# Patient Record
Sex: Male | Born: 1965
Health system: Southern US, Community
[De-identification: ages and names within clinical notes are randomized; demographics above are authoritative.]

## PROBLEM LIST (undated history)

## (undated) DIAGNOSIS — G8220 Paraplegia, unspecified: Secondary | ICD-10-CM

## (undated) DIAGNOSIS — K219 Gastro-esophageal reflux disease without esophagitis: Secondary | ICD-10-CM

## (undated) DIAGNOSIS — G8929 Other chronic pain: Secondary | ICD-10-CM

## (undated) DIAGNOSIS — F1490 Cocaine use, unspecified, uncomplicated: Secondary | ICD-10-CM

## (undated) DIAGNOSIS — K529 Noninfective gastroenteritis and colitis, unspecified: Secondary | ICD-10-CM

## (undated) DIAGNOSIS — W3400XA Accidental discharge from unspecified firearms or gun, initial encounter: Secondary | ICD-10-CM

## (undated) DIAGNOSIS — G4730 Sleep apnea, unspecified: Secondary | ICD-10-CM

## (undated) DIAGNOSIS — L8990 Pressure ulcer of unspecified site, unspecified stage: Secondary | ICD-10-CM

## (undated) DIAGNOSIS — S21239A Puncture wound without foreign body of unspecified back wall of thorax without penetration into thoracic cavity, initial encounter: Secondary | ICD-10-CM

## (undated) DIAGNOSIS — E43 Unspecified severe protein-calorie malnutrition: Secondary | ICD-10-CM

## (undated) DIAGNOSIS — I10 Essential (primary) hypertension: Secondary | ICD-10-CM

## (undated) HISTORY — PX: EYE SURGERY: SHX253

## (undated) HISTORY — PX: HIP SURGERY: SHX245

## (undated) HISTORY — PX: OTHER SURGICAL HISTORY: SHX169

---

## 2011-11-10 ENCOUNTER — Emergency Department (HOSPITAL_COMMUNITY): Payer: Medicare Other

## 2011-11-10 ENCOUNTER — Encounter (HOSPITAL_COMMUNITY): Payer: Self-pay | Admitting: Emergency Medicine

## 2011-11-10 ENCOUNTER — Emergency Department (HOSPITAL_COMMUNITY)
Admission: EM | Admit: 2011-11-10 | Discharge: 2011-11-10 | Disposition: A | Discharge status: Home / Self Care | Payer: Medicare Other | Attending: Emergency Medicine | Admitting: Emergency Medicine

## 2011-11-10 DIAGNOSIS — M25429 Effusion, unspecified elbow: Secondary | ICD-10-CM | POA: Insufficient documentation

## 2011-11-10 DIAGNOSIS — I10 Essential (primary) hypertension: Secondary | ICD-10-CM | POA: Insufficient documentation

## 2011-11-10 DIAGNOSIS — G8220 Paraplegia, unspecified: Secondary | ICD-10-CM | POA: Insufficient documentation

## 2011-11-10 DIAGNOSIS — M7030 Other bursitis of elbow, unspecified elbow: Secondary | ICD-10-CM

## 2011-11-10 DIAGNOSIS — K219 Gastro-esophageal reflux disease without esophagitis: Secondary | ICD-10-CM | POA: Insufficient documentation

## 2011-11-10 DIAGNOSIS — M25529 Pain in unspecified elbow: Secondary | ICD-10-CM | POA: Insufficient documentation

## 2011-11-10 DIAGNOSIS — Z79899 Other long term (current) drug therapy: Secondary | ICD-10-CM | POA: Insufficient documentation

## 2011-11-10 DIAGNOSIS — M7020 Olecranon bursitis, unspecified elbow: Secondary | ICD-10-CM | POA: Insufficient documentation

## 2011-11-10 HISTORY — DX: Essential (primary) hypertension: I10

## 2011-11-10 HISTORY — DX: Gastro-esophageal reflux disease without esophagitis: K21.9

## 2011-11-10 HISTORY — DX: Paraplegia, unspecified: G82.20

## 2011-11-10 MED ORDER — PREDNISONE 20 MG PO TABS
60.0000 mg | ORAL_TABLET | Freq: Once | ORAL | Status: AC
  Administered 2011-11-10: 60 mg via ORAL
  Filled 2011-11-10: qty 3

## 2011-11-10 MED ORDER — PREDNISONE 20 MG PO TABS: ORAL_TABLET | ORAL | Status: AC

## 2011-11-10 NOTE — ED Notes (Signed)
Pt c/o left elbow pain x3 days. Pt staes arm has eben swolllen. No swelling noted and no inc in temp noted. Pt denies any trauma.

## 2011-11-10 NOTE — ED Provider Notes (Signed)
History     CSN: [id number]  Arrival date & time 11/10/11  1733   First MD Initiated Contact with Patient 11/10/11 2002      Chief Complaint  Patient presents with  . Elbow Pain    (Consider location/radiation/quality/duration/timing/severity/associated sxs/prior treatment) HPI Comments: Agent 0 paraplegic.  He sees a wheelchair for mobility, now having left elbow pain with out known injury.  No fall.  Has not hit it against a wall.  There is no significant swelling, redness, no tingling distally.  Full range of motion  The history is provided by the patient.    Past Medical History  Diagnosis Date  . Paraplegia   . Hypertension   . Acid reflux     History reviewed. No pertinent past surgical history.  History reviewed. No pertinent family history.  History  Substance Use Topics  . Smoking status: Never Smoker   . Smokeless tobacco: Not on file  . Alcohol Use: No      Review of Systems  Constitutional: Negative for fever.  Musculoskeletal: Negative for joint swelling.  Neurological: Negative for weakness and numbness.    Allergies  Review of patient's allergies indicates no known allergies.  Home Medications   Current Outpatient Rx  Name Route Sig Dispense Refill  . HYDROCHLOROTHIAZIDE 25 MG PO TABS Oral Take 25 mg by mouth daily.    . IBUPROFEN 800 MG PO TABS Oral Take 800 mg by mouth every 8 (eight) hours as needed. For pain    . LISINOPRIL 5 MG PO TABS Oral Take 5 mg by mouth daily.    . OXYCODONE-ACETAMINOPHEN 5-325 MG PO TABS Oral Take 1 tablet by mouth 3 (three) times daily.    [name] RANITIDINE HCL 300 MG PO CAPS Oral Take 300 mg by mouth 2 (two) times daily.    [name] PREDNISONE 20 MG PO TABS  3 Tabs PO Days 1-3, then 2 tabs PO Days 4-6, then 1 tab PO Day 7-9, then Half Tab PO Day 10-12 20 tablet 0    BP 143/90  Pulse 89  Temp(Src) 98.3 F (36.8 C) (Oral)  Resp 18  SpO2 98%  Physical Exam  Constitutional: He is oriented to person, place, and time. He  appears well-developed and well-nourished.  HENT:  Head: Normocephalic.  Eyes: Pupils are equal, round, and reactive to light.  Neck: Normal range of motion.  Cardiovascular: Normal rate.   Pulmonary/Chest: Effort normal.  Musculoskeletal:       : Bursitis with increased pain in the medial bursa or greater than lateral bursa or  Neurological: He is alert and oriented to person, place, and time.  Skin: Skin is warm and dry.  Psychiatric: He has a normal mood and affect.    ED Course  Procedures (including critical care time)  Labs Reviewed - No data to display Dg Elbow Complete Left  11/10/2011  *RADIOLOGY REPORT*  Clinical Data: Left elbow pain and swelling  LEFT ELBOW - COMPLETE 3+ VIEW  Comparison: None.  Findings: Four views of the left elbow submitted.  No acute fracture or subluxation.  Spurring proximal dorsal aspect of the ulna is noted.  No posterior fat pad sign. Mild spurring of medial humeral epicondyle.  IMPRESSION: No acute fracture or subluxation.  Spurring of proximal ulna.  Original Report Authenticated By: Natasha Mead, M.D.     1. Bursitis of elbow       MDM  Bursitis    Medical screening examination/treatment/procedure(s) were performed by non-physician practitioner and  as supervising physician I was immediately available for consultation/collaboration. Osvaldo Human, M.D.     Arman Filter, NP 11/10/11 2056  Arman Filter, NP 11/10/11 2056  Carleene Cooper III, MD 11/11/11 [phone]

## 2011-11-10 NOTE — Progress Notes (Signed)
Orthopedic Tech Progress Note Patient Details:  [name] 1966/04/19 [id number]  Other Ortho Devices Type of Ortho Device: Ace wrap Ortho Device Location: left elbow Ortho Device Interventions: Application   Nikki Dom 11/10/2011, 8:49 PM

## 2011-11-10 NOTE — ED Notes (Signed)
Pt d/c home in [place]. Pt verbalizes understanding of medication admin

## 2011-11-10 NOTE — ED Notes (Signed)
Pt c/o left elbow pain and swelling x 2 days; pt parapalegic

## 2011-12-26 ENCOUNTER — Emergency Department (HOSPITAL_COMMUNITY)
Admission: EM | Admit: 2011-12-26 | Discharge: 2011-12-26 | Disposition: A | Discharge status: Home / Self Care | Payer: PRIVATE HEALTH INSURANCE | Attending: Emergency Medicine | Admitting: Emergency Medicine

## 2011-12-26 ENCOUNTER — Encounter (HOSPITAL_COMMUNITY): Payer: Self-pay | Admitting: Emergency Medicine

## 2011-12-26 DIAGNOSIS — Z79899 Other long term (current) drug therapy: Secondary | ICD-10-CM | POA: Insufficient documentation

## 2011-12-26 DIAGNOSIS — G8220 Paraplegia, unspecified: Secondary | ICD-10-CM | POA: Insufficient documentation

## 2011-12-26 DIAGNOSIS — G4730 Sleep apnea, unspecified: Secondary | ICD-10-CM

## 2011-12-26 DIAGNOSIS — K219 Gastro-esophageal reflux disease without esophagitis: Secondary | ICD-10-CM | POA: Insufficient documentation

## 2011-12-26 DIAGNOSIS — I10 Essential (primary) hypertension: Secondary | ICD-10-CM | POA: Insufficient documentation

## 2011-12-26 HISTORY — DX: Accidental discharge from unspecified firearms or gun, initial encounter: W34.00XA

## 2011-12-26 HISTORY — DX: Puncture wound without foreign body of unspecified back wall of thorax without penetration into thoracic cavity, initial encounter: S21.239A

## 2011-12-26 NOTE — Discharge Instructions (Signed)
It is very important to followup with your primary care physician in [place] in the next week to further address your concerns of sleep apnea. You will need a sleep study for further evaluation and management. Return to the closest ER for any emergent changing or worsening symptoms  Sleep Apnea Sleep apnea is when a person's sleep is disrupted many times. This makes a person tired during the day. There are two types of sleep apnea. One is when breathing stops for a short time because your airway is blocked (obstructive sleep apnea). The other type is when the brain sometimes fails to give the normal signal to breathe (central sleep apnea).  HOME CARE  Do not sleep on your back.   Take all medicine as told by your doctor.   Avoid alcohol, calming medicines (sedatives), and depressant drugs.   Use your CPAP (a mask-like machine and pump) to keep your airway open while sleeping.   If you are overweight, try to lose some weight. Talk to your doctor about a healthy weight goal.   If the problem is related to heart failure, take medicines as told by your doctor.  MAKE SURE YOU:  Understand these instructions.   Will watch your condition.   Will get help right away if you are not doing well or get worse.  Document Released: 06/27/2008 Document Revised: 09/07/2011 Document Reviewed: 06/27/2008 [facility] Patient Information 2012 [place], [place].

## 2011-12-26 NOTE — ED Notes (Signed)
Pt states he has periods where he stops breathing in his sleep  Pt states he went to sleep this afternoon and someone woke him up telling him he had stopped breathing while he was sleeping  Pt here for evaluation  Pt denies pain at this time
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
## 2011-12-27 NOTE — ED Provider Notes (Signed)
Medical screening examination/treatment/procedure(s) were performed by non-physician practitioner and as supervising physician I was immediately available for consultation/collaboration.    Masyn Rostro L Angelle Isais, MD 12/27/11 1518 

## 2012-02-16 ENCOUNTER — Emergency Department (HOSPITAL_COMMUNITY)
Admission: EM | Admit: 2012-02-16 | Discharge: 2012-02-17 | Disposition: A | Discharge status: Home / Self Care | Payer: PRIVATE HEALTH INSURANCE | Attending: Emergency Medicine | Admitting: Emergency Medicine

## 2012-02-16 ENCOUNTER — Emergency Department (HOSPITAL_COMMUNITY): Payer: PRIVATE HEALTH INSURANCE

## 2012-02-16 ENCOUNTER — Encounter (HOSPITAL_COMMUNITY): Payer: Self-pay | Admitting: *Deleted

## 2012-02-16 DIAGNOSIS — M25519 Pain in unspecified shoulder: Secondary | ICD-10-CM | POA: Insufficient documentation

## 2012-02-16 DIAGNOSIS — K219 Gastro-esophageal reflux disease without esophagitis: Secondary | ICD-10-CM | POA: Insufficient documentation

## 2012-02-16 DIAGNOSIS — G8220 Paraplegia, unspecified: Secondary | ICD-10-CM | POA: Insufficient documentation

## 2012-02-16 DIAGNOSIS — I10 Essential (primary) hypertension: Secondary | ICD-10-CM | POA: Insufficient documentation

## 2012-02-16 NOTE — ED Notes (Signed)
C/o R shoulder pain, onset 3d ago, now worse & severe today, "h/o paraplegia, uses upper body for transfers, has been unable to transfer self using R arm as usual", CMS intact, decreased ROM, radial pulses strong and equal, grips strong and equal, difficult to discern deformity d/t body habitus.

## 2012-02-17 MED ORDER — IBUPROFEN 800 MG PO TABS: 800.0000 mg | ORAL_TABLET | Freq: Three times a day (TID) | ORAL | Status: AC | PRN

## 2012-02-17 MED ORDER — OXYCODONE-ACETAMINOPHEN 5-325 MG PO TABS
2.0000 | ORAL_TABLET | Freq: Once | ORAL | Status: AC
  Administered 2012-02-17: 2 via ORAL
  Filled 2012-02-17: qty 2

## 2012-02-17 MED ORDER — METHOCARBAMOL 500 MG PO TABS: 1000.0000 mg | ORAL_TABLET | Freq: Four times a day (QID) | ORAL | Status: AC

## 2012-02-17 MED ORDER — OXYCODONE-ACETAMINOPHEN 5-325 MG PO TABS: 1.0000 | ORAL_TABLET | Freq: Four times a day (QID) | ORAL | Status: AC | PRN
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
## 2012-02-17 NOTE — ED Provider Notes (Signed)
Medical screening examination/treatment/procedure(s) were performed by non-physician practitioner and as supervising physician I was immediately available for consultation/collaboration.  Jasmine Awe, MD 02/17/12 [phone]

## 2012-02-17 NOTE — Discharge Instructions (Signed)
Please read and follow all provided instructions.  Your diagnoses today include:  1. Shoulder pain     Tests performed today include:  Shoulder x-ray that did not show any broken bones or dislocations  Vital signs. See below for your results today.   Medications prescribed:   Percocet (oxycodone/acetaminophen) - narcotic pain medication  You have been prescribed narcotic pain medication such as Vicodin or Percocet: DO NOT drive or perform any activities that require you to be awake and alert because this medicine can make you drowsy. BE VERY CAREFUL not to take multiple medicines containing Tylenol (also called acetaminophen). Doing so can lead to an overdose which can damage your liver and cause liver failure and possibly death.    Robaxin (methocarbamol) - muscle relaxer medication  You have been prescribed a muscle relaxer medication such as Robaxin, Flexeril, or Valium: DO NOT drive or perform any activities that require you to be awake and alert because this medicine can make you drowsy.    Ibuprofen - anti-inflammatory pain medication  Do not exceed 800mg  ibuprofen every 8 hours  You have been prescribed an anti-inflammatory medication or NSAID. Take with food. Take smallest effective dose for the shortest duration needed for your pain. Stop taking if you experience stomach pain or vomiting.   Take any prescribed medications only as directed.  Home care instructions:  Follow any educational materials contained in this packet.  BE VERY CAREFUL not to take multiple medicines containing Tylenol (also called acetaminophen). Doing so can lead to an overdose which can damage your liver and cause liver failure and possibly death.   Follow-up instructions: Please follow-up with your primary care provider in the next 3 days for further evaluation of your symptoms. If you do not have a primary care doctor -- see below for referral information.   Follow-up with orthopedic doctor in  one week for further evaluation of your pain.   Return instructions:   Please return to the Emergency Department if you experience worsening symptoms.   Please return if you have any other emergent concerns.  Additional Information:  Your vital signs today were: BP 142/76  Pulse 85  Temp(Src) 97.9 F (36.6 C) (Oral)  Resp 18  SpO2 100% If your blood pressure (BP) was elevated above 135/85 this visit, please have this repeated by your doctor within one month. -------------- No Primary Care Doctor Call [facility]  [phone] Other agencies that provide inexpensive medical care    [facility]  [phone]    [facility]  [phone]    [facility]  [phone]    [facility]  [phone]    [facility]  [phone]    [facility]  [phone] -------------- RESOURCE GUIDE:  Dental Problems  Patients with Medicaid: [facility] [phone]                                            [phone] [address] [employer] [address] Phone:  [phone]  Phone:  [phone]  If unable to pay or uninsured, contact:  [facility] or [facility]. to become qualified for the adult dental clinic.  Chronic Pain Problems Contact [facility]  [phone] Patients need to be referred by their primary care doctor.  Insufficient Money for Medicine Contact [employer]:  call "211" or [facility] [phone].  Psychological Services [facility]  [phone] [facility]  [phone] [facility]   [phone] (emergency services [phone])  Substance Abuse Resources [facility]  [phone] [facility] [phone] [facility] [place] [phone] Floydene Flock [phone] Residential & Outpatient Substance Abuse Program  [phone]  Abuse/Neglect [facility]  Child Abuse Hotline 256-376-9503 [facility] Child Abuse Hotline [phone]) [phone] (After Hours)  Emergency Shelter [facility] [phone]  Maternity Homes [facility] at [facility] [place] [facility] [phone] [facility] [phone]  [facility] Resources  [facility]     [employer]                          [facility] [address] [place]                       [address]      [address]  Blondell Reveal Phone:  [phone]                                   Phone:  [phone]                 Phone:  [phone]  [facility] Phone:  [phone]  [facility] Child Abuse Hotline [phone] (After Hours)

## 2012-02-29 ENCOUNTER — Other Ambulatory Visit: Payer: Self-pay | Admitting: Orthopedic Surgery

## 2012-02-29 DIAGNOSIS — M25519 Pain in unspecified shoulder: Secondary | ICD-10-CM

## 2012-02-29 DIAGNOSIS — Z139 Encounter for screening, unspecified: Secondary | ICD-10-CM

## 2012-03-12 ENCOUNTER — Other Ambulatory Visit: Payer: Self-pay | Admitting: Orthopedic Surgery

## 2012-03-12 ENCOUNTER — Ambulatory Visit
Admission: RE | Admit: 2012-03-12 | Discharge: 2012-03-12 | Disposition: A | Discharge status: Home / Self Care | Payer: PRIVATE HEALTH INSURANCE | Source: Ambulatory Visit | Attending: Orthopedic Surgery | Admitting: Orthopedic Surgery

## 2012-03-12 DIAGNOSIS — M25519 Pain in unspecified shoulder: Secondary | ICD-10-CM

## 2012-03-12 DIAGNOSIS — Z139 Encounter for screening, unspecified: Secondary | ICD-10-CM

## 2012-03-14 ENCOUNTER — Ambulatory Visit
Admission: RE | Admit: 2012-03-14 | Discharge: 2012-03-14 | Disposition: A | Discharge status: Home / Self Care | Payer: PRIVATE HEALTH INSURANCE | Source: Ambulatory Visit | Attending: Orthopedic Surgery | Admitting: Orthopedic Surgery

## 2012-03-14 DIAGNOSIS — M25519 Pain in unspecified shoulder: Secondary | ICD-10-CM

## 2012-03-14 MED ORDER — IOHEXOL 180 MG/ML  SOLN: 15.0000 mL | Freq: Once | INTRAMUSCULAR | Status: AC | PRN

## 2012-11-14 ENCOUNTER — Emergency Department (HOSPITAL_COMMUNITY)
Admission: EM | Admit: 2012-11-14 | Discharge: 2012-11-15 | Disposition: A | Discharge status: Home / Self Care | Payer: PRIVATE HEALTH INSURANCE | Attending: Emergency Medicine | Admitting: Emergency Medicine

## 2012-11-14 DIAGNOSIS — K219 Gastro-esophageal reflux disease without esophagitis: Secondary | ICD-10-CM | POA: Insufficient documentation

## 2012-11-14 DIAGNOSIS — I10 Essential (primary) hypertension: Secondary | ICD-10-CM | POA: Insufficient documentation

## 2012-11-14 DIAGNOSIS — G8220 Paraplegia, unspecified: Secondary | ICD-10-CM | POA: Insufficient documentation

## 2012-11-14 DIAGNOSIS — R42 Dizziness and giddiness: Secondary | ICD-10-CM | POA: Insufficient documentation

## 2012-11-14 DIAGNOSIS — IMO0001 Reserved for inherently not codable concepts without codable children: Secondary | ICD-10-CM | POA: Insufficient documentation

## 2012-11-14 DIAGNOSIS — R509 Fever, unspecified: Secondary | ICD-10-CM | POA: Insufficient documentation

## 2012-11-14 DIAGNOSIS — R51 Headache: Secondary | ICD-10-CM | POA: Insufficient documentation

## 2012-11-14 DIAGNOSIS — N390 Urinary tract infection, site not specified: Secondary | ICD-10-CM | POA: Insufficient documentation

## 2012-11-14 DIAGNOSIS — R5381 Other malaise: Secondary | ICD-10-CM | POA: Insufficient documentation

## 2012-11-14 DIAGNOSIS — Z87828 Personal history of other (healed) physical injury and trauma: Secondary | ICD-10-CM | POA: Insufficient documentation

## 2012-11-14 DIAGNOSIS — R5383 Other fatigue: Secondary | ICD-10-CM | POA: Insufficient documentation

## 2012-11-14 DIAGNOSIS — R209 Unspecified disturbances of skin sensation: Secondary | ICD-10-CM | POA: Insufficient documentation

## 2012-11-14 DIAGNOSIS — Z79899 Other long term (current) drug therapy: Secondary | ICD-10-CM | POA: Insufficient documentation

## 2012-11-14 LAB — URINALYSIS, ROUTINE W REFLEX MICROSCOPIC
Nitrite: NEGATIVE
Specific Gravity, Urine: 1.025 (ref 1.005–1.030)
pH: 5 (ref 5.0–8.0)

## 2012-11-14 LAB — URINE MICROSCOPIC-ADD ON

## 2012-11-14 LAB — COMPREHENSIVE METABOLIC PANEL
ALT: 18 U/L (ref 0–53)
Albumin: 3.8 g/dL (ref 3.5–5.2)
Alkaline Phosphatase: 77 U/L (ref 39–117)
BUN: 16 mg/dL (ref 6–23)
Chloride: 96 mEq/L (ref 96–112)
Potassium: 3.8 mEq/L (ref 3.5–5.1)
Sodium: 135 mEq/L (ref 135–145)
Total Bilirubin: 0.2 mg/dL — ABNORMAL LOW (ref 0.3–1.2)

## 2012-11-14 LAB — CBC WITH DIFFERENTIAL/PLATELET
Basophils Relative: 0 % (ref 0–1)
Hemoglobin: 13.8 g/dL (ref 13.0–17.0)
Lymphs Abs: 1 10*3/uL (ref 0.7–4.0)
MCHC: 32.8 g/dL (ref 30.0–36.0)
Monocytes Relative: 9 % (ref 3–12)
Neutro Abs: 2.5 10*3/uL (ref 1.7–7.7)
Neutrophils Relative %: 64 % (ref 43–77)
RBC: 4.7 MIL/uL (ref 4.22–5.81)

## 2012-11-14 MED ORDER — CIPROFLOXACIN HCL 500 MG PO TABS
500.0000 mg | ORAL_TABLET | Freq: Once | ORAL | Status: AC
Start: 2012-11-15 — End: 2012-11-15
  Administered 2012-11-15: 500 mg via ORAL
  Filled 2012-11-14: qty 1

## 2012-11-14 MED ORDER — HYDROCODONE-ACETAMINOPHEN 5-325 MG PO TABS
2.0000 | ORAL_TABLET | Freq: Once | ORAL | Status: AC
  Administered 2012-11-15: 2 via ORAL
  Filled 2012-11-14: qty 2

## 2012-11-14 MED ORDER — CIPROFLOXACIN HCL 500 MG PO TABS: 500.0000 mg | ORAL_TABLET | Freq: Two times a day (BID) | ORAL | Status: DC

## 2012-11-14 NOTE — ED Notes (Signed)
Pt in from home via [facility], pt c/o HA onset today, pt reports dx of UTI x 1 wk, pt A&O x 4, follows commands, speaks in complete sentences
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
## 2012-11-15 NOTE — ED Provider Notes (Signed)
Medical screening examination/treatment/procedure(s) were performed by non-physician practitioner and as supervising physician I was immediately available for consultation/collaboration.   Gerhard Munch, MD 11/15/12 [phone]

## 2012-11-16 LAB — URINE CULTURE

## 2013-01-27 ENCOUNTER — Emergency Department (HOSPITAL_COMMUNITY)
Admission: EM | Admit: 2013-01-27 | Discharge: 2013-01-27 | Disposition: A | Discharge status: Home / Self Care | Payer: PRIVATE HEALTH INSURANCE | Attending: Emergency Medicine | Admitting: Emergency Medicine

## 2013-01-27 ENCOUNTER — Encounter (HOSPITAL_COMMUNITY): Payer: Self-pay | Admitting: Adult Health

## 2013-01-27 DIAGNOSIS — Z87828 Personal history of other (healed) physical injury and trauma: Secondary | ICD-10-CM | POA: Insufficient documentation

## 2013-01-27 DIAGNOSIS — Y9389 Activity, other specified: Secondary | ICD-10-CM | POA: Insufficient documentation

## 2013-01-27 DIAGNOSIS — S335XXA Sprain of ligaments of lumbar spine, initial encounter: Secondary | ICD-10-CM | POA: Insufficient documentation

## 2013-01-27 DIAGNOSIS — I10 Essential (primary) hypertension: Secondary | ICD-10-CM | POA: Insufficient documentation

## 2013-01-27 DIAGNOSIS — Z79899 Other long term (current) drug therapy: Secondary | ICD-10-CM | POA: Insufficient documentation

## 2013-01-27 DIAGNOSIS — S39012A Strain of muscle, fascia and tendon of lower back, initial encounter: Secondary | ICD-10-CM

## 2013-01-27 DIAGNOSIS — K219 Gastro-esophageal reflux disease without esophagitis: Secondary | ICD-10-CM | POA: Insufficient documentation

## 2013-01-27 DIAGNOSIS — Z8669 Personal history of other diseases of the nervous system and sense organs: Secondary | ICD-10-CM | POA: Insufficient documentation

## 2013-01-27 DIAGNOSIS — Y92410 Unspecified street and highway as the place of occurrence of the external cause: Secondary | ICD-10-CM | POA: Insufficient documentation

## 2013-01-27 MED ORDER — IBUPROFEN 600 MG PO TABS: 600.0000 mg | ORAL_TABLET | Freq: Four times a day (QID) | ORAL | Status: DC | PRN

## 2013-01-27 MED ORDER — HYDROCODONE-ACETAMINOPHEN 5-325 MG PO TABS: 1.0000 | ORAL_TABLET | ORAL | Status: DC | PRN

## 2013-01-27 MED ORDER — METHOCARBAMOL 500 MG PO TABS: 500.0000 mg | ORAL_TABLET | Freq: Two times a day (BID) | ORAL | Status: DC

## 2013-01-27 MED ORDER — METHOCARBAMOL 500 MG PO TABS
1000.0000 mg | ORAL_TABLET | Freq: Once | ORAL | Status: DC
  Filled 2013-01-27: qty 2

## 2013-01-27 MED ORDER — KETOROLAC TROMETHAMINE 60 MG/2ML IM SOLN
60.0000 mg | Freq: Once | INTRAMUSCULAR | Status: DC
  Filled 2013-01-27: qty 2

## 2013-01-27 MED ORDER — HYDROCODONE-ACETAMINOPHEN 5-325 MG PO TABS
2.0000 | ORAL_TABLET | Freq: Once | ORAL | Status: DC
  Filled 2013-01-27: qty 2
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
## 2013-01-27 NOTE — ED Notes (Signed)
Dr. Yelverton at the bedside.  
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
## 2013-12-24 ENCOUNTER — Emergency Department (HOSPITAL_COMMUNITY)
Admission: EM | Admit: 2013-12-24 | Discharge: 2013-12-25 | Disposition: A | Discharge status: Home / Self Care | Payer: Medicare Other | Attending: Emergency Medicine | Admitting: Emergency Medicine

## 2013-12-24 ENCOUNTER — Encounter (HOSPITAL_COMMUNITY): Payer: Self-pay | Admitting: Emergency Medicine

## 2013-12-24 ENCOUNTER — Emergency Department (HOSPITAL_COMMUNITY): Payer: Medicare Other

## 2013-12-24 DIAGNOSIS — Z87828 Personal history of other (healed) physical injury and trauma: Secondary | ICD-10-CM | POA: Diagnosis not present

## 2013-12-24 DIAGNOSIS — L8990 Pressure ulcer of unspecified site, unspecified stage: Secondary | ICD-10-CM

## 2013-12-24 DIAGNOSIS — G8220 Paraplegia, unspecified: Secondary | ICD-10-CM | POA: Diagnosis not present

## 2013-12-24 DIAGNOSIS — Z993 Dependence on wheelchair: Secondary | ICD-10-CM | POA: Insufficient documentation

## 2013-12-24 DIAGNOSIS — L89309 Pressure ulcer of unspecified buttock, unspecified stage: Secondary | ICD-10-CM | POA: Insufficient documentation

## 2013-12-24 DIAGNOSIS — I10 Essential (primary) hypertension: Secondary | ICD-10-CM | POA: Insufficient documentation

## 2013-12-24 DIAGNOSIS — Z79899 Other long term (current) drug therapy: Secondary | ICD-10-CM | POA: Insufficient documentation

## 2013-12-24 DIAGNOSIS — L89109 Pressure ulcer of unspecified part of back, unspecified stage: Secondary | ICD-10-CM | POA: Insufficient documentation

## 2013-12-24 DIAGNOSIS — K219 Gastro-esophageal reflux disease without esophagitis: Secondary | ICD-10-CM | POA: Diagnosis not present

## 2013-12-24 LAB — CBC WITH DIFFERENTIAL/PLATELET
Basophils Absolute: 0 10*3/uL (ref 0.0–0.1)
Basophils Relative: 0 % (ref 0–1)
EOS ABS: 0.1 10*3/uL (ref 0.0–0.7)
Eosinophils Relative: 1 % (ref 0–5)
HCT: 41.3 % (ref 39.0–52.0)
HEMOGLOBIN: 13.3 g/dL (ref 13.0–17.0)
LYMPHS ABS: 1.8 10*3/uL (ref 0.7–4.0)
Lymphocytes Relative: 20 % (ref 12–46)
MCH: 28.9 pg (ref 26.0–34.0)
MCHC: 32.2 g/dL (ref 30.0–36.0)
MCV: 89.6 fL (ref 78.0–100.0)
MONOS PCT: 6 % (ref 3–12)
Monocytes Absolute: 0.5 10*3/uL (ref 0.1–1.0)
NEUTROS ABS: 7 10*3/uL (ref 1.7–7.7)
NEUTROS PCT: 74 % (ref 43–77)
Platelets: 233 10*3/uL (ref 150–400)
RBC: 4.61 MIL/uL (ref 4.22–5.81)
RDW: 14 % (ref 11.5–15.5)
WBC: 9.4 10*3/uL (ref 4.0–10.5)

## 2013-12-24 LAB — COMPREHENSIVE METABOLIC PANEL
ALK PHOS: 86 U/L (ref 39–117)
ALT: 13 U/L (ref 0–53)
AST: 14 U/L (ref 0–37)
Albumin: 3.6 g/dL (ref 3.5–5.2)
BUN: 9 mg/dL (ref 6–23)
CHLORIDE: 102 meq/L (ref 96–112)
CO2: 27 mEq/L (ref 19–32)
Calcium: 8.9 mg/dL (ref 8.4–10.5)
Creatinine, Ser: 0.59 mg/dL (ref 0.50–1.35)
GLUCOSE: 103 mg/dL — AB (ref 70–99)
POTASSIUM: 3.6 meq/L — AB (ref 3.7–5.3)
Sodium: 140 mEq/L (ref 137–147)
Total Protein: 7.3 g/dL (ref 6.0–8.3)

## 2013-12-24 MED ORDER — ONDANSETRON HCL 4 MG/2ML IJ SOLN
4.0000 mg | Freq: Once | INTRAMUSCULAR | Status: AC
  Administered 2013-12-24: 4 mg via INTRAVENOUS
  Filled 2013-12-24: qty 2

## 2013-12-24 MED ORDER — SODIUM CHLORIDE 0.9 % IV BOLUS (SEPSIS)
1000.0000 mL | Freq: Once | INTRAVENOUS | Status: AC
  Administered 2013-12-24: 1000 mL via INTRAVENOUS

## 2013-12-24 MED ORDER — MORPHINE SULFATE 4 MG/ML IJ SOLN
4.0000 mg | Freq: Once | INTRAMUSCULAR | Status: AC
  Administered 2013-12-24: 4 mg via INTRAVENOUS
  Filled 2013-12-24: qty 1

## 2013-12-24 NOTE — Clinical Social Work Note (Signed)
On call CSW received call from PA stating that patient is from homeless shelter and needs wound care set up for when patient discharges. CSW will notify appropriate [facility] CSW in morning for CSW related needs (homelessness). Care management will need to be contacted for wound care needs.  Liz Beach, [place], [place], [phone]

## 2013-12-24 NOTE — ED Notes (Signed)
Per EMS: Pt from homeless shelter.  Pt not very mobile.  Able to stand with assistance.  Has been staying at the homeless shelter with wife and has not been able to stand very often.  Pt c/o pain d/t decubiti from sitting in wheelchair too long.
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
## 2013-12-24 NOTE — ED Notes (Signed)
IV team called for pt.

## 2013-12-25 LAB — SEDIMENTATION RATE: SED RATE: 10 mm/h (ref 0–16)

## 2013-12-25 LAB — C-REACTIVE PROTEIN: CRP: 2.9 mg/dL — ABNORMAL HIGH (ref <0.60)

## 2013-12-25 MED ORDER — OXYCODONE-ACETAMINOPHEN 5-325 MG PO TABS: 1.0000 | ORAL_TABLET | Freq: Three times a day (TID) | ORAL | Status: DC | PRN
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
## 2013-12-25 NOTE — Discharge Instructions (Signed)
Please call and set up an appointment with Wound Clinic regarding her decubitus ulcers Please keep site clean Please take medications as prescribed-while on pain medications his be absolutely no drinking alcohol, driving, operating any heavy machinery if there is extra please disposer proper manner. Please do not take any extra Tylenol for this can lead to liver issues and Tylenol overdose. Please call number, Health Connect at [phone] to find the nearest provider by you Please rest and stay hydrated Please continue to monitor symptoms and if symptoms are to worsen or change (fever greater than 101, chills, sweating, nausea, vomiting, diarrhea, abdominal pain, chest pain, shortness of breath, difficulty breathing, worsening of ulcers, numbness, tingling, drainage, bleeding, depth of ulcers get worse) please report back to the ED immediately  Pressure Ulcer A pressure ulcer is a sore that has formed from the breakdown of skin and exposure of deeper layers of tissue. It develops in areas of the body where there is unrelieved pressure. Pressure ulcers are usually found over a bony area, such as the shoulder blades, spine, lower back, hips, knees, ankles, and heels. Pressure ulcers vary in severity. Your health care provider may determine the severity (stage) of your pressure ulcer. The stages include:  Stage I The skin is red, and when the skin is pressed, it stays red.  Stage II The top layer of skin is gone, and there is a shallow, pink ulcer.  Stage III The ulcer becomes deeper, and it is more difficult to see the whole wound. Also, there may be yellow or brown parts, as well as pink and red parts.  Stage IV The ulcer may be deep and red, pink, brown, white, or yellow. Bone or muscle may be seen.  Unstageable pressure ulcer The ulcer is covered almost completely with black, brown, or yellow tissue. It is not known how deep the ulcer is or what stage it is until this covering comes  off.  Suspected deep tissue injury A person's skin can be injured from pressure or pulling on the skin when his or her position is changed. The skin appears purple or maroon. There may not be an opening in the skin, but there could be a blood-filled blister. This deep tissue injury is often difficult to see in people with darker skin tones. The site may open and become deeper in time. However, early interventions will help the area heal and may prevent the area from opening. CAUSES  Pressure ulcers are caused by pressure against the skin that limits the flow of blood to the skin and nearby tissues. There are many risk factors that can lead to pressure sores. RISK FACTORS  Decreased ability to move.  Decreased ability to feel pain or discomfort.  Excessive skin moisture from urine, stool, sweat, or secretions.  Poor nutrition.  Dehydration.  Tobacco, drug, or alcohol abuse.  Having someone pull on bedsheets that are under you, such as when health care workers are changing your position in a hospital bed.  Obesity.  Increased adult age.  Age of less than 2 years.  Premature newborns.  Hospitalization in a critical care unit for longer than 4 days with use of medical devices.  Prolonged use of medical devices.  Critical illness.  Anemia.  Traumatic brain injury.  Spinal cord injury.  Stroke.  Diabetes.  Poor blood glucose control.  Low blood pressure (hypotension).  Low oxygen levels.  Medicines that reduce blood flow.  Infection. DIAGNOSIS  Your health care provider will diagnose your  pressure ulcer based on its appearance. The health care provider may determine the stage of your pressure ulcer as well. Tests may be done to check for infection, to assess your circulation, or to check for other diseases, such as diabetes. TREATMENT  Treatment of your pressure ulcer begins with determining what stage the ulcer is in. Your treatment team may include your health  care provider, a wound care specialist, a nutritionist, a physical therapist, and a Psychologist, sport and exercise. Possible treatments may include:   Moving or repositioning every 1 2 hours.  Using beds or mattresses to shift your body weight and pressure points frequently.  Improving your diet.  Cleaning and bandaging (dressing) the open wound.  Giving antibiotic medicines.  Removing damaged tissue.  Surgery and sometimes skin grafts. HOME CARE INSTRUCTIONS  If you were hospitalized, follow the care plan that was started in the hospital.  Avoid staying in the same position for more than 2 hours. Use padding, devices, or mattresses to cushion your pressure points as directed by your health care provider.  Eat a well-balanced diet. Take nutritional supplements and vitamins as directed by your health care provider.  Keep all follow-up appointments.  Only take over-the-counter or prescription medicines for pain, fever, or discomfort as directed by your health care provider. SEEK MEDICAL CARE IF:   Your pressure ulcer is not improving.  You do not know how to care for your pressure ulcer.  You notice other areas of redness on your skin. SEEK IMMEDIATE MEDICAL CARE IF:   You have increasing redness, swelling, or pain in your pressure ulcer.  You notice pus coming from your pressure ulcer.  You have a fever.  You notice a bad smell coming from the wound or dressing.  Your pressure ulcer opens up again. Document Released: 09/18/2005 Document Revised: 07/09/2013 Document Reviewed: 05/26/2013 [facility] Patient Information 2014 [place].   Emergency Department Resource Guide 1) Find a Doctor and Pay Out of Pocket Although you won't have to find out who is covered by your insurance plan, it is a good idea to ask around and get recommendations. You will then need to call the office and see if the doctor you have chosen will accept you as a new patient and what types of options they offer for  patients who are self-pay. Some doctors offer discounts or will set up payment plans for their patients who do not have insurance, but you will need to ask so you aren't surprised when you get to your appointment.  2) Contact Your Local Health Department Not all health departments have doctors that can see patients for sick visits, but many do, so it is worth a call to see if yours does. If you don't know where your local health department is, you can check in your phone book. The CDC also has a tool to help you locate your state's health department, and many state websites also have listings of all of their local health departments.  3) Find a [facility] If your illness is not likely to be very severe or complicated, you may want to try a walk in clinic. These are popping up all over the country in pharmacies, drugstores, and shopping centers. They're usually staffed by nurse practitioners or physician assistants that have been trained to treat common illnesses and complaints. They're usually fairly quick and inexpensive. However, if you have serious medical issues or chronic medical problems, these are probably not your best option.  No Primary Care Doctor: - Call  Health Connect at  [phone] - they can help you locate a primary care doctor that  accepts your insurance, provides certain services, etc. - Physician Referral Service- [phone]  Chronic Pain Problems: Organization         Address  Phone   Notes  [facility]  [phone] Patients need to be referred by their primary care doctor.   Medication Assistance: Organization         Address  Phone   Notes  [facility] [address] [place], [place] [address] [phone] --Must be a resident of [facility] -- Must have NO insurance coverage whatsoever (no Medicaid/ Medicare, etc.) -- The pt. MUST have a primary care doctor that directs their care regularly  and follows them in the community   [facility]  [phone]   [employer]  [phone]    Agencies that provide inexpensive medical care: Organization         Address  Phone   Notes  [place]  [phone]   [facility]    [phone]   [facility] [place], [place] [address] [phone]   [place] [address] [place] [phone]   Planned Parenthood    [phone]   [facility]    [phone]   [place] [address], [place] Phone:  [phone], Fax:  [phone] Hours of Operation:  9 am - 6 pm, M-F.  Also accepts Medicaid/Medicare and self-pay.  [facility] for [place], [address], [place] Phone: [phone], Fax: [phone]. Hours of Operation:  8:30 am - 5:30 pm, M-F.  Also accepts Medicaid and self-pay.  [facility] [place] [address], [place] Phone: [phone]   [place], [place], [place] [phone], Ext. [phone] Mondays & Thursdays: 7-9 AM.  First 15 patients are seen on a first come, first serve basis.    [place] Providers:  Organization         Address  Phone   Notes  [facility] [address], [place] [phone] Also accepts self-pay patients.  [facility] [address], [place]  [phone]   [place], [address], [place] [phone]   [facility] [address], [place] [phone]   Lucianne Lei [address], [place]   [phone] Only accepts [place] Access [place] patients after they have their name applied to their card.   Self-Pay (no insurance) in [facility]:  Organization         Address  Phone   Notes  Sickle  Cell Patients, [facility] [place] [phone]   [facility] [place] [phone]   [facility] [place]  [place], [place], [place] [phone]   [facility]/Dr. Osei-Bonsu  [address], [place] or [address], [place] [phone] Phone number for both [place] and [place] locations is the same.  [facility] [address] [place] [phone]   [place]  [address], [place] or [address] [phone]   [facility] [place] [phone], phone; [phone], fax Sees patients 1st and 3rd Saturday of every month.  Must not qualify for public or private insurance (i.e. Medicaid, Medicare, [place] Health Choice, Veterans' Benefits)  Household income should be no more than 200% of the poverty level The clinic cannot treat you if you are pregnant or think you are pregnant  Sexually transmitted diseases are not treated at the clinic.    Dental Care: Organization         Address  Phone  Notes  [facility] Department of [facility] [phone] Accepts children up to age 76 who are enrolled in [place] or [place]; pregnant women with a Medicaid card; and children who have applied for Medicaid or [place] Health Choice, but were declined, whose parents can pay a reduced fee at time of service.  [facility] Department of [facility]  [address], [place] [phone] Accepts children up to age 67 who are enrolled in [place] or [place]; pregnant women with a Medicaid card; and children who have applied for Medicaid or [place] Health Choice, but were declined, whose parents can pay a reduced fee at time of service.  [facility]  [facility] PRO[facility]  [place] [phone] Patients are seen by appointment only. Walk-ins are not accepted. Bernice will see patients 59 years of age and older. Monday - Tuesday (8am-5pm) Most Wednesdays (8:30-5pm) $30 per visit, cash only  [facility] Adult [facility] Access PROGRAM  [address], [place] [employer] [phone] Patients are seen by appointment only. Walk-ins are not accepted. Fernando [place] will see patients 59 years of age and older. One Wednesday Evening (Monthly: Volunteer Based).  $30 per visit, cash only  [place]  [phone] for adults; Children under age 88, call Graduate Pediatric Dentistry at [phone]. Children aged 26-14, please call [phone] to request a pediatric application.  Dental services are provided in all areas of dental care including fillings, crowns and bridges, complete and partial dentures, implants, gum treatment, root canals, and extractions. Preventive care is also provided. Treatment is provided to both adults and children. Patients are selected via a lottery and there is often a waiting list.   [facility] [address] [place]  [phone] [email or web address]   [facility] [address] [place], [place] [phone], Ext. [phone] Second and Fourth Thursday of each month, opens at 6:30 AM; Clinic ends at 9 AM.  Patients are seen on a first-come first-served basis, and a limited number are seen during each clinic.   [facility]  [address] Hillard Danker [place], [place] [phone]   Eligibility Requirements You must have lived in [place], [place], or [place] counties for at least the last three months.   You cannot be eligible for state or federal sponsored [employer], including [employer], [place], or [employer].   You generally cannot be eligible for healthcare insurance through your employer.    How to apply: Eligibility  screenings are held every Tuesday and Wednesday afternoon from 1:00 pm until 4:00 pm. You do not need an appointment for the interview!  [facility] [address], [place], [place]   [place]  [phone]   °[facility]  [phone]   °[facility]  336-570-6415   ° °Behavioral Health Resources in the Community: °Intensive Outpatient Programs °Organization         Address  Phone  Notes  °[facility] [address], [place], [place] [phone]   °[facility] [address], [place], [place] [phone]   °[facility] [address], [place], [place] ° 336-882-2125   °[facility] [address],  °[place], [place] 1-800-853-5163 or 336-641-4981   °Substance Abuse Resources °Organization         Address  Phone  Notes  °[facility]  336-882-2125   °[facility]  336-784-9470   °[facility]  336-285-9073   °[facility]  336-845-3988   °Residential & Outpatient Substance Abuse Program  1-800-659-3381   °Psychological Services °Organization         Address  Phone  Notes  ° Health  336- [phone]   °[facility]  336- 378-7881   °[facility] [address], [place] 1-800-853-5163 or 336-641-4981   ° °Mobile Crisis Teams °Organization         Address  Phone  Notes  °[facility], Mobile Crisis Care Unit  1-877-626-1772   °Assertive °[facility] Services ° [address] [place], [place] 336-834-9664   °Sharon DeEsch [address] °[place] 336-554-5454   ° °Self-Help/Support Groups °Organization         Address  Phone             Notes  °[facility] [place] - variety of support groups  336- 373-1402 Call for more information  °Narcotics Anonymous (NA), [facility] [address], °[place]  2 meetings at  this location  ° °Residential Treatment Programs °Organization         Address  Phone  Notes  °[facility] [address],    °[place]  1-866-801-8205   °[facility] ° [address], [place], [place] [phone]   °[facility] [address], [place] 336-845-3988 Admissions: 8am-3pm M-F  °[facility] [address]    °[place], [place] 336-841-1104   °[facility] [address], [place], [place] 336-379-7146   °[facility] [address]  °[place], [place] 336-285-9073   °[facility] - Intensive Outpatient [address], [place], [place] 336-852-3033   °[facility] [address]  °[place], [place] 1-877-615-2722 or 336-784-9470   °[facility] ([facility]) [address] [place], [place] 336-227-7417 Accepts Medicaid  °[facility] [address]  °[place] 1-800-659-3381 Substance Abuse/Addiction Treatment  ° °[place] [facility] Resources °Organization         Address  Phone  Notes  °[facility]  (888) 581-9988   °Julie Brannon, PhD [address] [place], [place]   (336) 349-5553 or (336) 951-0000   °[facility]   [address] °[place],  (336) 349-4454   °[facility] [address], [place],  (336) 342-8316 Insurance/Medicaid/sponsorship through [facility]  °[facility] [address]                                      [place], [place] [phone] [place], [place] [phone]    Dr. Adele Schilder  [phone]   [facility] Dept. 1) [address] [place] 2) [place] 3)  [address], [place] [phone]  [phone]   [place] [phone] or [phone] (After Hours)

## 2013-12-25 NOTE — Progress Notes (Signed)
12/25/13 Received a call at [place] from [facility] regarding wound care needs.  ED practitioner requested that CM assist patient with obtaining a PCP and set up [facility] services for wound care.  CM provided patient with the Health[facility] number for obtaining a PCP accepting his insurance.  Patient has a current PCP who is located in [place], [place], but needs to find a new local PCP.  CM spoke with patient to inform him that home health may not be an option, but would be pursued.  Patient was agreeable to this. CM was unable to obtain home health services for wound care and was given the recommendation by home health agencies that patient follow through with making his appointment at the outpatient wound care clinic.  Patient was provided this number on his discharge instructions per ED practitioner.  CM left a voicemail for patient communicating the need to follow-up at the wound care center.

## 2013-12-25 NOTE — ED Provider Notes (Signed)
Medical screening examination/treatment/procedure(s) were conducted as a shared visit with non-physician practitioner(s) and myself.  I personally evaluated the patient during the encounter.   EKG Interpretation None        Houston Siren III, MD 12/25/13 1515

## 2013-12-28 NOTE — ED Provider Notes (Signed)
12/26/2013 This provider spoke with the patient - verified by DOB - via the telephone. Patient reported that he is doing well. Reported that the ulcers are doing okay. Reported that he has not heard from anyone regarding the nurse to come and assist his wounds just yet. Recommended patient to come and be re-assessed based on CRP level. Patient reported that he will come back to the ED and be re-assessed. Patient understood and agreed to plan.   Jamse Mead, PA-C 12/29/13 1757

## 2013-12-28 NOTE — ED Provider Notes (Signed)
Medical screening examination/treatment/procedure(s) were conducted as a shared visit with non-physician practitioner(s) and myself.  I personally evaluated the patient during the encounter.   EKG Interpretation None        Houston Siren III, MD 12/28/13 1246

## 2013-12-29 ENCOUNTER — Emergency Department (HOSPITAL_COMMUNITY)
Admission: EM | Admit: 2013-12-29 | Discharge: 2013-12-29 | Disposition: A | Discharge status: Home / Self Care | Payer: PRIVATE HEALTH INSURANCE | Attending: Emergency Medicine | Admitting: Emergency Medicine

## 2013-12-29 ENCOUNTER — Encounter (HOSPITAL_COMMUNITY): Payer: Self-pay | Admitting: Emergency Medicine

## 2013-12-29 DIAGNOSIS — L8992 Pressure ulcer of unspecified site, stage 2: Secondary | ICD-10-CM | POA: Insufficient documentation

## 2013-12-29 DIAGNOSIS — Z8669 Personal history of other diseases of the nervous system and sense organs: Secondary | ICD-10-CM | POA: Insufficient documentation

## 2013-12-29 DIAGNOSIS — L89109 Pressure ulcer of unspecified part of back, unspecified stage: Secondary | ICD-10-CM | POA: Insufficient documentation

## 2013-12-29 DIAGNOSIS — K219 Gastro-esophageal reflux disease without esophagitis: Secondary | ICD-10-CM | POA: Insufficient documentation

## 2013-12-29 DIAGNOSIS — L89152 Pressure ulcer of sacral region, stage 2: Secondary | ICD-10-CM

## 2013-12-29 DIAGNOSIS — I10 Essential (primary) hypertension: Secondary | ICD-10-CM | POA: Insufficient documentation

## 2013-12-29 DIAGNOSIS — Z79899 Other long term (current) drug therapy: Secondary | ICD-10-CM | POA: Insufficient documentation

## 2013-12-29 DIAGNOSIS — Z4800 Encounter for change or removal of nonsurgical wound dressing: Secondary | ICD-10-CM | POA: Insufficient documentation

## 2013-12-29 NOTE — ED Notes (Signed)
Pt seen a few days ago for decubitus on buttocks; states got a call from the ER and told to come back for recheck
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
## 2013-12-29 NOTE — Discharge Instructions (Signed)
Pressure Ulcer A pressure ulcer is a sore that has formed from the breakdown of skin and exposure of deeper layers of tissue. It develops in areas of the body where there is unrelieved pressure. Pressure ulcers are usually found over a bony area, such as the shoulder blades, spine, lower back, hips, knees, ankles, and heels. Pressure ulcers vary in severity. Your health care provider may determine the severity (stage) of your pressure ulcer. The stages include:  Stage I The skin is red, and when the skin is pressed, it stays red.  Stage II The top layer of skin is gone, and there is a shallow, pink ulcer.  Stage III The ulcer becomes deeper, and it is more difficult to see the whole wound. Also, there may be yellow or brown parts, as well as pink and red parts.  Stage IV The ulcer may be deep and red, pink, brown, white, or yellow. Bone or muscle may be seen.  Unstageable pressure ulcer The ulcer is covered almost completely with black, brown, or yellow tissue. It is not known how deep the ulcer is or what stage it is until this covering comes off.  Suspected deep tissue injury A person's skin can be injured from pressure or pulling on the skin when his or her position is changed. The skin appears purple or maroon. There may not be an opening in the skin, but there could be a blood-filled blister. This deep tissue injury is often difficult to see in people with darker skin tones. The site may open and become deeper in time. However, early interventions will help the area heal and may prevent the area from opening. CAUSES  Pressure ulcers are caused by pressure against the skin that limits the flow of blood to the skin and nearby tissues. There are many risk factors that can lead to pressure sores. RISK FACTORS  Decreased ability to move.  Decreased ability to feel pain or discomfort.  Excessive skin moisture from urine, stool, sweat, or secretions.  Poor  nutrition.  Dehydration.  Tobacco, drug, or alcohol abuse.  Having someone pull on bedsheets that are under you, such as when health care workers are changing your position in a hospital bed.  Obesity.  Increased adult age.  Age of less than 2 years.  Premature newborns.  Hospitalization in a critical care unit for longer than 4 days with use of medical devices.  Prolonged use of medical devices.  Critical illness.  Anemia.  Traumatic brain injury.  Spinal cord injury.  Stroke.  Diabetes.  Poor blood glucose control.  Low blood pressure (hypotension).  Low oxygen levels.  Medicines that reduce blood flow.  Infection. DIAGNOSIS  Your health care provider will diagnose your pressure ulcer based on its appearance. The health care provider may determine the stage of your pressure ulcer as well. Tests may be done to check for infection, to assess your circulation, or to check for other diseases, such as diabetes. TREATMENT  Treatment of your pressure ulcer begins with determining what stage the ulcer is in. Your treatment team may include your health care provider, a wound care specialist, a nutritionist, a physical therapist, and a surgeon. Possible treatments may include:   Moving or repositioning every 1 2 hours.  Using beds or mattresses to shift your body weight and pressure points frequently.  Improving your diet.  Cleaning and bandaging (dressing) the open wound.  Giving antibiotic medicines.  Removing damaged tissue.  Surgery and sometimes skin grafts. HOME CARE   INSTRUCTIONS  If you were hospitalized, follow the care plan that was started in the hospital.  Avoid staying in the same position for more than 2 hours. Use padding, devices, or mattresses to cushion your pressure points as directed by your health care provider.  Eat a well-balanced diet. Take nutritional supplements and vitamins as directed by your health care provider.  Keep all  follow-up appointments.  Only take over-the-counter or prescription medicines for pain, fever, or discomfort as directed by your health care provider. SEEK MEDICAL CARE IF:   Your pressure ulcer is not improving.  You do not know how to care for your pressure ulcer.  You notice other areas of redness on your skin. SEEK IMMEDIATE MEDICAL CARE IF:   You have increasing redness, swelling, or pain in your pressure ulcer.  You notice pus coming from your pressure ulcer.  You have a fever.  You notice a bad smell coming from the wound or dressing.  Your pressure ulcer opens up again. Document Released: 09/18/2005 Document Revised: 07/09/2013 Document Reviewed: 05/26/2013 ExitCare Patient Information 2014 [employer].  

## 2013-12-30 NOTE — ED Provider Notes (Signed)
Medical screening examination/treatment/procedure(s) were conducted as a shared visit with non-physician practitioner(s) and myself.  I personally evaluated the patient during the encounter.   Please see my separate note.     Houston Siren III, MD 12/30/13 [phone]

## 2014-01-07 ENCOUNTER — Encounter (HOSPITAL_COMMUNITY): Payer: Self-pay | Admitting: Emergency Medicine

## 2014-01-07 ENCOUNTER — Emergency Department (HOSPITAL_COMMUNITY)
Admission: EM | Admit: 2014-01-07 | Discharge: 2014-01-07 | Disposition: A | Discharge status: Home / Self Care | Payer: PRIVATE HEALTH INSURANCE | Attending: Emergency Medicine | Admitting: Emergency Medicine

## 2014-01-07 DIAGNOSIS — L8990 Pressure ulcer of unspecified site, unspecified stage: Secondary | ICD-10-CM

## 2014-01-07 DIAGNOSIS — L89309 Pressure ulcer of unspecified buttock, unspecified stage: Secondary | ICD-10-CM | POA: Insufficient documentation

## 2014-01-07 DIAGNOSIS — Z8669 Personal history of other diseases of the nervous system and sense organs: Secondary | ICD-10-CM | POA: Insufficient documentation

## 2014-01-07 DIAGNOSIS — I10 Essential (primary) hypertension: Secondary | ICD-10-CM | POA: Insufficient documentation

## 2014-01-07 DIAGNOSIS — Z79899 Other long term (current) drug therapy: Secondary | ICD-10-CM | POA: Insufficient documentation

## 2014-01-07 DIAGNOSIS — K219 Gastro-esophageal reflux disease without esophagitis: Secondary | ICD-10-CM | POA: Insufficient documentation

## 2014-01-07 DIAGNOSIS — R509 Fever, unspecified: Secondary | ICD-10-CM | POA: Insufficient documentation

## 2014-01-07 MED ORDER — OXYCODONE-ACETAMINOPHEN 5-325 MG PO TABS
2.0000 | ORAL_TABLET | Freq: Once | ORAL | Status: AC
  Administered 2014-01-07: 2 via ORAL
  Filled 2014-01-07: qty 2

## 2014-01-07 NOTE — ED Provider Notes (Signed)
See prior note   Janice Norrie, MD 01/07/14 1546

## 2014-01-07 NOTE — Discharge Instructions (Signed)
Pressure Ulcer A pressure ulcer is a sore that has formed from the breakdown of skin and exposure of deeper layers of tissue. It develops in areas of the body where there is unrelieved pressure. Pressure ulcers are usually found over a bony area, such as the shoulder blades, spine, lower back, hips, knees, ankles, and heels. Pressure ulcers vary in severity. Your health care provider may determine the severity (stage) of your pressure ulcer. The stages include:  Stage I The skin is red, and when the skin is pressed, it stays red.  Stage II The top layer of skin is gone, and there is a shallow, pink ulcer.  Stage III The ulcer becomes deeper, and it is more difficult to see the whole wound. Also, there may be yellow or brown parts, as well as pink and red parts.  Stage IV The ulcer may be deep and red, pink, brown, white, or yellow. Bone or muscle may be seen.  Unstageable pressure ulcer The ulcer is covered almost completely with black, brown, or yellow tissue. It is not known how deep the ulcer is or what stage it is until this covering comes off.  Suspected deep tissue injury A person's skin can be injured from pressure or pulling on the skin when his or her position is changed. The skin appears purple or maroon. There may not be an opening in the skin, but there could be a blood-filled blister. This deep tissue injury is often difficult to see in people with darker skin tones. The site may open and become deeper in time. However, early interventions will help the area heal and may prevent the area from opening. CAUSES  Pressure ulcers are caused by pressure against the skin that limits the flow of blood to the skin and nearby tissues. There are many risk factors that can lead to pressure sores. RISK FACTORS  Decreased ability to move.  Decreased ability to feel pain or discomfort.  Excessive skin moisture from urine, stool, sweat, or secretions.  Poor  nutrition.  Dehydration.  Tobacco, drug, or alcohol abuse.  Having someone pull on bedsheets that are under you, such as when health care workers are changing your position in a hospital bed.  Obesity.  Increased adult age.  Age of less than 2 years.  Premature newborns.  Hospitalization in a critical care unit for longer than 4 days with use of medical devices.  Prolonged use of medical devices.  Critical illness.  Anemia.  Traumatic brain injury.  Spinal cord injury.  Stroke.  Diabetes.  Poor blood glucose control.  Low blood pressure (hypotension).  Low oxygen levels.  Medicines that reduce blood flow.  Infection. DIAGNOSIS  Your health care provider will diagnose your pressure ulcer based on its appearance. The health care provider may determine the stage of your pressure ulcer as well. Tests may be done to check for infection, to assess your circulation, or to check for other diseases, such as diabetes. TREATMENT  Treatment of your pressure ulcer begins with determining what stage the ulcer is in. Your treatment team may include your health care provider, a wound care specialist, a nutritionist, a physical therapist, and a surgeon. Possible treatments may include:   Moving or repositioning every 1 2 hours.  Using beds or mattresses to shift your body weight and pressure points frequently.  Improving your diet.  Cleaning and bandaging (dressing) the open wound.  Giving antibiotic medicines.  Removing damaged tissue.  Surgery and sometimes skin grafts. HOME CARE   INSTRUCTIONS  If you were hospitalized, follow the care plan that was started in the hospital.  Avoid staying in the same position for more than 2 hours. Use padding, devices, or mattresses to cushion your pressure points as directed by your health care provider.  Eat a well-balanced diet. Take nutritional supplements and vitamins as directed by your health care provider.  Keep all  follow-up appointments.  Only take over-the-counter or prescription medicines for pain, fever, or discomfort as directed by your health care provider. SEEK MEDICAL CARE IF:   Your pressure ulcer is not improving.  You do not know how to care for your pressure ulcer.  You notice other areas of redness on your skin. SEEK IMMEDIATE MEDICAL CARE IF:   You have increasing redness, swelling, or pain in your pressure ulcer.  You notice pus coming from your pressure ulcer.  You have a fever.  You notice a bad smell coming from the wound or dressing.  Your pressure ulcer opens up again. Document Released: 09/18/2005 Document Revised: 07/09/2013 Document Reviewed: 05/26/2013 ExitCare Patient Information 2014 [employer].  
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
## 2014-01-07 NOTE — ED Notes (Signed)
Pt states that he has several decubitus ulcers on his bottom.  States that one of them "busted open" yesterday.

## 2014-01-08 ENCOUNTER — Encounter (HOSPITAL_BASED_OUTPATIENT_CLINIC_OR_DEPARTMENT_OTHER): Payer: PRIVATE HEALTH INSURANCE | Attending: Internal Medicine

## 2014-01-08 DIAGNOSIS — Z79899 Other long term (current) drug therapy: Secondary | ICD-10-CM | POA: Insufficient documentation

## 2014-01-08 DIAGNOSIS — G8220 Paraplegia, unspecified: Secondary | ICD-10-CM | POA: Insufficient documentation

## 2014-01-08 DIAGNOSIS — L89309 Pressure ulcer of unspecified buttock, unspecified stage: Secondary | ICD-10-CM | POA: Insufficient documentation

## 2014-01-08 DIAGNOSIS — I10 Essential (primary) hypertension: Secondary | ICD-10-CM | POA: Insufficient documentation

## 2014-01-08 DIAGNOSIS — L8994 Pressure ulcer of unspecified site, stage 4: Secondary | ICD-10-CM | POA: Insufficient documentation

## 2014-01-08 DIAGNOSIS — K219 Gastro-esophageal reflux disease without esophagitis: Secondary | ICD-10-CM | POA: Insufficient documentation
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
## 2014-01-15 ENCOUNTER — Other Ambulatory Visit (HOSPITAL_BASED_OUTPATIENT_CLINIC_OR_DEPARTMENT_OTHER): Payer: Self-pay | Admitting: Internal Medicine

## 2014-01-15 DIAGNOSIS — M869 Osteomyelitis, unspecified: Secondary | ICD-10-CM

## 2014-01-19 ENCOUNTER — Ambulatory Visit (HOSPITAL_COMMUNITY): Payer: PRIVATE HEALTH INSURANCE

## 2014-01-22 ENCOUNTER — Ambulatory Visit (HOSPITAL_COMMUNITY): Payer: PRIVATE HEALTH INSURANCE

## 2014-02-02 ENCOUNTER — Encounter (HOSPITAL_BASED_OUTPATIENT_CLINIC_OR_DEPARTMENT_OTHER): Payer: PRIVATE HEALTH INSURANCE | Attending: Internal Medicine

## 2014-02-02 DIAGNOSIS — L89309 Pressure ulcer of unspecified buttock, unspecified stage: Secondary | ICD-10-CM | POA: Insufficient documentation

## 2014-02-02 DIAGNOSIS — L8994 Pressure ulcer of unspecified site, stage 4: Secondary | ICD-10-CM | POA: Insufficient documentation

## 2014-02-12 ENCOUNTER — Other Ambulatory Visit (HOSPITAL_BASED_OUTPATIENT_CLINIC_OR_DEPARTMENT_OTHER): Payer: Self-pay | Admitting: Internal Medicine

## 2014-02-12 DIAGNOSIS — M869 Osteomyelitis, unspecified: Secondary | ICD-10-CM

## 2014-02-16 ENCOUNTER — Emergency Department (HOSPITAL_COMMUNITY)
Admission: EM | Admit: 2014-02-16 | Discharge: 2014-02-16 | Disposition: A | Discharge status: Home / Self Care | Payer: PRIVATE HEALTH INSURANCE | Attending: Emergency Medicine | Admitting: Emergency Medicine

## 2014-02-16 ENCOUNTER — Other Ambulatory Visit (HOSPITAL_BASED_OUTPATIENT_CLINIC_OR_DEPARTMENT_OTHER): Payer: Self-pay | Admitting: Internal Medicine

## 2014-02-16 ENCOUNTER — Encounter (HOSPITAL_COMMUNITY): Payer: Self-pay | Admitting: Emergency Medicine

## 2014-02-16 ENCOUNTER — Ambulatory Visit (HOSPITAL_COMMUNITY)
Admission: RE | Admit: 2014-02-16 | Discharge: 2014-02-16 | Disposition: A | Discharge status: Home / Self Care | Payer: PRIVATE HEALTH INSURANCE | Source: Ambulatory Visit | Attending: Internal Medicine | Admitting: Internal Medicine

## 2014-02-16 DIAGNOSIS — K219 Gastro-esophageal reflux disease without esophagitis: Secondary | ICD-10-CM | POA: Insufficient documentation

## 2014-02-16 DIAGNOSIS — I10 Essential (primary) hypertension: Secondary | ICD-10-CM | POA: Insufficient documentation

## 2014-02-16 DIAGNOSIS — L8990 Pressure ulcer of unspecified site, unspecified stage: Secondary | ICD-10-CM | POA: Diagnosis not present

## 2014-02-16 DIAGNOSIS — Z789 Other specified health status: Secondary | ICD-10-CM | POA: Insufficient documentation

## 2014-02-16 DIAGNOSIS — Z8669 Personal history of other diseases of the nervous system and sense organs: Secondary | ICD-10-CM | POA: Insufficient documentation

## 2014-02-16 DIAGNOSIS — Z760 Encounter for issue of repeat prescription: Secondary | ICD-10-CM | POA: Insufficient documentation

## 2014-02-16 DIAGNOSIS — M869 Osteomyelitis, unspecified: Secondary | ICD-10-CM

## 2014-02-16 DIAGNOSIS — M549 Dorsalgia, unspecified: Secondary | ICD-10-CM | POA: Insufficient documentation

## 2014-02-16 DIAGNOSIS — Z87828 Personal history of other (healed) physical injury and trauma: Secondary | ICD-10-CM | POA: Insufficient documentation

## 2014-02-16 DIAGNOSIS — L89309 Pressure ulcer of unspecified buttock, unspecified stage: Secondary | ICD-10-CM | POA: Diagnosis present

## 2014-02-16 DIAGNOSIS — Z993 Dependence on wheelchair: Secondary | ICD-10-CM | POA: Insufficient documentation

## 2014-02-16 DIAGNOSIS — Z79899 Other long term (current) drug therapy: Secondary | ICD-10-CM | POA: Insufficient documentation

## 2014-02-16 DIAGNOSIS — L89109 Pressure ulcer of unspecified part of back, unspecified stage: Secondary | ICD-10-CM | POA: Insufficient documentation

## 2014-02-16 MED ORDER — OXYCODONE-ACETAMINOPHEN 5-325 MG PO TABS
1.0000 | ORAL_TABLET | Freq: Once | ORAL | Status: AC
  Administered 2014-02-16: 1 via ORAL
  Filled 2014-02-16: qty 1

## 2014-02-16 MED ORDER — OXYCODONE-ACETAMINOPHEN 5-325 MG PO TABS: 1.0000 | ORAL_TABLET | Freq: Three times a day (TID) | ORAL | Status: DC | PRN

## 2014-02-16 MED ORDER — IOHEXOL 300 MG/ML  SOLN
100.0000 mL | Freq: Once | INTRAMUSCULAR | Status: AC | PRN
  Administered 2014-02-16: 100 mL via INTRAVENOUS

## 2014-02-16 NOTE — ED Notes (Signed)
Pt discharged to CT . CT scheduled by wound care center. CT rescheduled his afternoon CT to accomodate the pt.

## 2014-02-16 NOTE — ED Provider Notes (Signed)
Medical screening examination/treatment/procedure(s) were performed by non-physician practitioner and as supervising physician I was immediately available for consultation/collaboration.   EKG Interpretation None        Hoy Morn, MD 02/16/14 1524

## 2014-02-16 NOTE — Discharge Instructions (Signed)
Please call your doctor for a followup appointment within 24-48 hours. When you talk to your doctor please let them know that you were seen in the emergency department and have them acquire all of your records so that they can discuss the findings with you and formulate a treatment plan to fully care for your new and ongoing problems. Please call and set-up an appointment with your primary care provider to be seen and re-assessed Please rest and stay hydrated Please take pain medications as prescribed - while on pain medications there is to be no drinking alcohol, driving, operating any heavy machinery - if there is extra please dispose in a proper manner Please apply pillows to either side of the legs to prop yourself up to relieve pressure to your buttocks Please changes positions every 2 hours - can set timer on phone to aid in memory Please keep appointment with Dr. [name] today at 3:45 PM Please continue to monitor symptoms closely and if symptoms are to worsen or change (fever greater than 101, chills, sweating, nausea, vomiting, diarrhea, stomach pain, numbness, tingling, loss of sensation, red streaks, active drainage from the site, bleeding) please report back to the ED immediately    Chronic Back Pain  When back pain lasts longer than 3 months, it is called chronic back pain.People with chronic back pain often go through certain periods that are more intense (flare-ups).  CAUSES Chronic back pain can be caused by wear and tear (degeneration) on different structures in your back. These structures include:  The bones of your spine (vertebrae) and the joints surrounding your spinal cord and nerve roots (facets).  The strong, fibrous tissues that connect your vertebrae (ligaments). Degeneration of these structures may result in pressure on your nerves. This can lead to constant pain. HOME CARE INSTRUCTIONS  Avoid bending, heavy lifting, prolonged sitting, and activities which make the  problem worse.  Take brief periods of rest throughout the day to reduce your pain. Lying down or standing usually is better than sitting while you are resting.  Take over-the-counter or prescription medicines only as directed by your caregiver. SEEK IMMEDIATE MEDICAL CARE IF:   You have weakness or numbness in one of your legs or feet.  You have trouble controlling your bladder or bowels.  You have nausea, vomiting, abdominal pain, shortness of breath, or fainting. Document Released: 10/26/2004 Document Revised: 12/11/2011 Document Reviewed: 09/02/2011 [facility] Patient Information 2014 [place], [place]. Pressure Ulcer A pressure ulcer is a sore that has formed from the breakdown of skin and exposure of deeper layers of tissue. It develops in areas of the body where there is unrelieved pressure. Pressure ulcers are usually found over a bony area, such as the shoulder blades, spine, lower back, hips, knees, ankles, and heels. Pressure ulcers vary in severity. Your health care provider may determine the severity (stage) of your pressure ulcer. The stages include:  Stage I The skin is red, and when the skin is pressed, it stays red.  Stage II The top layer of skin is gone, and there is a shallow, pink ulcer.  Stage III The ulcer becomes deeper, and it is more difficult to see the whole wound. Also, there may be yellow or brown parts, as well as pink and red parts.  Stage IV The ulcer may be deep and red, pink, brown, white, or yellow. Bone or muscle may be seen.  Unstageable pressure ulcer The ulcer is covered almost completely with black, brown, or yellow tissue. It is  not known how deep the ulcer is or what stage it is until this covering comes off.  Suspected deep tissue injury A person's skin can be injured from pressure or pulling on the skin when his or her position is changed. The skin appears purple or maroon. There may not be an opening in the skin, but there could be a blood-filled  blister. This deep tissue injury is often difficult to see in people with darker skin tones. The site may open and become deeper in time. However, early interventions will help the area heal and may prevent the area from opening. CAUSES  Pressure ulcers are caused by pressure against the skin that limits the flow of blood to the skin and nearby tissues. There are many risk factors that can lead to pressure sores. RISK FACTORS  Decreased ability to move.  Decreased ability to feel pain or discomfort.  Excessive skin moisture from urine, stool, sweat, or secretions.  Poor nutrition.  Dehydration.  Tobacco, drug, or alcohol abuse.  Having someone pull on bedsheets that are under you, such as when health care workers are changing your position in a hospital bed.  Obesity.  Increased adult age.  Age of less than 2 years.  Premature newborns.  Hospitalization in a critical care unit for longer than 4 days with use of medical devices.  Prolonged use of medical devices.  Critical illness.  Anemia.  Traumatic brain injury.  Spinal cord injury.  Stroke.  Diabetes.  Poor blood glucose control.  Low blood pressure (hypotension).  Low oxygen levels.  Medicines that reduce blood flow.  Infection. DIAGNOSIS  Your health care provider will diagnose your pressure ulcer based on its appearance. The health care provider may determine the stage of your pressure ulcer as well. Tests may be done to check for infection, to assess your circulation, or to check for other diseases, such as diabetes. TREATMENT  Treatment of your pressure ulcer begins with determining what stage the ulcer is in. Your treatment team may include your health care provider, a wound care specialist, a nutritionist, a physical therapist, and a Psychologist, sport and exercise. Possible treatments may include:   Moving or repositioning every 1 2 hours.  Using beds or mattresses to shift your body weight and pressure points  frequently.  Improving your diet.  Cleaning and bandaging (dressing) the open wound.  Giving antibiotic medicines.  Removing damaged tissue.  Surgery and sometimes skin grafts. HOME CARE INSTRUCTIONS  If you were hospitalized, follow the care plan that was started in the hospital.  Avoid staying in the same position for more than 2 hours. Use padding, devices, or mattresses to cushion your pressure points as directed by your health care provider.  Eat a well-balanced diet. Take nutritional supplements and vitamins as directed by your health care provider.  Keep all follow-up appointments.  Only take over-the-counter or prescription medicines for pain, fever, or discomfort as directed by your health care provider. SEEK MEDICAL CARE IF:   Your pressure ulcer is not improving.  You do not know how to care for your pressure ulcer.  You notice other areas of redness on your skin. SEEK IMMEDIATE MEDICAL CARE IF:   You have increasing redness, swelling, or pain in your pressure ulcer.  You notice pus coming from your pressure ulcer.  You have a fever.  You notice a bad smell coming from the wound or dressing.  Your pressure ulcer opens up again. Document Released: 09/18/2005 Document Revised: 07/09/2013 Document Reviewed: 05/26/2013 ExitCare  Patient Information 2014 [place].   Emergency Department Resource Guide 1) Find a Doctor and Pay Out of Pocket Although you won't have to find out who is covered by your insurance plan, it is a good idea to ask around and get recommendations. You will then need to call the office and see if the doctor you have chosen will accept you as a new patient and what types of options they offer for patients who are self-pay. Some doctors offer discounts or will set up payment plans for their patients who do not have insurance, but you will need to ask so you aren't surprised when you get to your appointment.  2) Contact Your Local Health  Department Not all health departments have doctors that can see patients for sick visits, but many do, so it is worth a call to see if yours does. If you don't know where your local health department is, you can check in your phone book. The CDC also has a tool to help you locate your state's health department, and many state websites also have listings of all of their local health departments.  3) Find a [facility] If your illness is not likely to be very severe or complicated, you may want to try a walk in clinic. These are popping up all over the country in pharmacies, drugstores, and shopping centers. They're usually staffed by nurse practitioners or physician assistants that have been trained to treat common illnesses and complaints. They're usually fairly quick and inexpensive. However, if you have serious medical issues or chronic medical problems, these are probably not your best option.  No Primary Care Doctor: - Call Health Connect at  [phone] - they can help you locate a primary care doctor that  accepts your insurance, provides certain services, etc. - Physician Referral Service- [phone]  Chronic Pain Problems: Organization         Address  Phone   Notes  [facility]  [phone] Patients need to be referred by their primary care doctor.   Medication Assistance: Organization         Address  Phone   Notes  [facility] Medication [facility] [address] [place], [place] [address] [phone] --Must be a resident of [facility] -- Must have NO insurance coverage whatsoever (no Medicaid/ Medicare, etc.) -- The pt. MUST have a primary care doctor that directs their care regularly and follows them in the community   MedAssist  [phone]   [employer]  [phone]    Agencies that provide inexpensive medical care: Organization         Address  Phone   Notes  [place]  [phone]   [facility]    [phone]   [facility] [place], [place] [address] [phone]   [place] [address], [place] [phone]   Planned Parenthood    [phone]   [facility]    [phone]   [place] and [place] [address], [place] Phone:  [phone], Fax:  [phone] Hours of Operation:  9 am - 6 pm, M-F.  Also accepts Medicaid/Medicare and self-pay.  [facility] for [place], [address], [place] Phone: [phone], Fax: [phone]. Hours of Operation:  8:30 am - 5:30 pm, M-F.  Also accepts  Medicaid and self-pay.  [facility] [place] [address], [place] Phone: [phone]   [place], [place], [place] [phone], Ext. [phone] Mondays & Thursdays: 7-9 AM.  First 15 patients are seen on a first come, first serve basis.    [place] Providers:  Organization         Address  Phone   Notes  [facility] [address], [place] [phone] Also accepts self-pay patients.  [facility] [address], [place]  [phone]   [place], [address], [place] [phone]   [facility] [address] [place] [phone]   Lucianne Lei [address], [place]   [phone] Only accepts [place] Access [place] patients after they have their name applied to their card.   Self-Pay (no insurance) in [facility] [place]:  Organization         Address  Phone   Notes  Sickle Cell Patients, [facility] [place] [phone]   [facility] [place] [phone]   [facility] [place]  [place], [place],  [name] [phone]   [facility]/Dr. Osei-Bonsu  [address], [place] or [address], [place] [phone] Phone number for both [place] and [place] locations is the same.  Urgent Medical and [facility] [address] [place] [phone]   [facility] [address] [place] or [address] [phone]   [facility] [address] [place] [phone], phone; [phone], fax Sees patients 1st and 3rd Saturday of every month.  Must not qualify for public or private insurance (i.e. Medicaid, Medicare, [place] Health Choice, Veterans' Benefits)  Household income should be no more than 200% of the poverty level The clinic cannot treat you if you are pregnant or think you are pregnant  Sexually transmitted diseases are not treated at the clinic.    Dental Care: Organization         Address  Phone  Notes  [facility] Department of [facility] [place] [phone] Accepts children up to age 57 who are enrolled in [place] or [place]; pregnant women with a Medicaid card; and children who have applied for Medicaid or [place] Health Choice, but were declined, whose parents can pay a reduced fee at time of service.  [facility] Department of [facility]  [address], [place] [phone] Accepts children up to age 46 who are enrolled in [place] or [place]; pregnant women with a Medicaid card; and children who have applied for Medicaid or [place] Health Choice, but were declined, whose parents can pay a reduced fee at time of service.  [facility] Adult Dental Access PROGRAM  [place] [phone] Patients are seen by appointment only. Walk-ins are not accepted. [place] will see patients 58 years of age and older. Monday - Tuesday (8am-5pm) Most Wednesdays  (8:30-5pm) $30 per visit, cash only  [facility] Adult Dental Access PROGRAM  [address], [facility] [phone] Patients are seen by appointment only. Walk-ins are not accepted. [place] will see  patients 63 years of age and older. One Wednesday Evening (Monthly: Volunteer Based).  $30 per visit, cash only  [place]  [phone] for adults; Children under age 82, call Graduate Pediatric Dentistry at [phone]. Children aged 81-14, please call [phone] to request a pediatric application.  Dental services are provided in all areas of dental care including fillings, crowns and bridges, complete and partial dentures, implants, gum treatment, root canals, and extractions. Preventive care is also provided. Treatment is provided to both adults and children. Patients are selected via a lottery and there is often a waiting list.   [facility] [address], [place]  [phone] [email or web address]   [facility] [address] [place], [place] [phone], Ext. [phone] Second and Fourth Thursday of each month, opens at 6:30 AM; Clinic ends at 9 AM.  Patients are seen on a first-come first-served basis, and a limited number are seen during each clinic.   [facility]  [address] Hillard Danker [place], [place] [phone]   Eligibility Requirements You must have lived in [place], [place], or [place] counties for at least the last three months.   You cannot be eligible for state or federal sponsored [employer], including [employer], [place], or [employer].   You generally cannot be eligible for healthcare insurance through your employer.    How to apply: Eligibility screenings are held every Tuesday and Wednesday afternoon from 1:00 pm until 4:00 pm. You do not need an appointment for the interview!  [facility] [place] [address], [place], [place]   [place]  [place] Department  [place]  [phone]    Behavioral Health Resources in the Community: Intensive Outpatient Programs Organization         Address  Phone  Notes  [place] [address] [place], [place] [phone]   [facility] Outpatient [address] [place], [place]   ADS: Alcohol & Drug Svcs [address] [place], [place]   [place] [address],  [place], [place] or [phone]   Substance Abuse Resources Organization         Address  Phone  Notes  Alcohol and Drug Services  [phone]   [place]  [phone]   [facility] [place]   Chinita Pester  [phone]   Residential & Outpatient Substance Abuse Program  [phone]   Psychological Services Organization         Address  Phone  Notes  [facility] [place]  [place]  [phone]   [place] [address] [place] or [phone]    Mobile Crisis Teams Organization         Address  Phone  Notes  [facility], Mobile Crisis Care Unit  [phone]   [facility]ive Psychotherapeutic Services  [address] [place], [place]   [place] [address] [place] [phone]    Self-Help/Support Groups Organization         Address  Phone             Notes  [place]. of [place] - variety of support groups  [place] Call for more information  Narcotics Anonymous (NA), [facility]aring [facility] [address], [employer]  [place]  2 meetings at this location   Residential Treatment Programs Organization         Address  Phone  Notes  [facility] [address],    [place]  [phone]   [facility]  [address] [place] [id number], [place], [place]   [place], [place] [phone] Admissions: 8am-3pm M-F  Incentives Substance [place] [address],    [place], [place] [phone]   [facility] [address] [place], [place], [place]   [facility] [address],  [place], [place]   [facility] - Intensive Outpatient [address] Kristeen [address], [place], [place]   [facility] ([place].) [employer]  [place], [place] [phone] or [phone]   [facility]idential Treatment Services (RTS) [address], [place], [place] Accepts Medicaid  [place] [address]  [place] [phone] Substance Abuse/Addiction Treatment   [facility] Organization         Address  Phone  Notes  [facility]  [phone]   Domenic Schwab, PhD [address] Arlis [place], [place]   [phone] or [phone]   [place] [address] [place], [place] [phone]   [facility] [address], [place], [place] [phone] Insurance/Medicaid/sponsorship through [facility] and Families [address], [address] [place]                                    [place], [place] [phone] [place] [address], [place] [phone]    Dr. Adele Schilder  [phone]   [facility] Dept. 1) [address] [place] 2) [place] 3)  [address], [place] [phone]  [phone]   [place] [phone] or [phone] (After Hours)

## 2014-02-16 NOTE — ED Notes (Signed)
Patient reports that he has been taking Percocet 7.5/325 mg for back pain and has needed pain meds for his decubitus that is being [facility], patient states he ran out of pain meds yesterday and needs a refill until he can go to his appointment on 02/25/14.
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
## 2014-02-20 ENCOUNTER — Encounter (HOSPITAL_COMMUNITY): Payer: Self-pay | Admitting: Emergency Medicine

## 2014-02-20 ENCOUNTER — Emergency Department (INDEPENDENT_AMBULATORY_CARE_PROVIDER_SITE_OTHER)
Admission: EM | Admit: 2014-02-20 | Discharge: 2014-02-20 | Disposition: A | Discharge status: Home / Self Care | Payer: Medicaid Other | Source: Home / Self Care | Attending: Family Medicine | Admitting: Family Medicine

## 2014-02-20 DIAGNOSIS — G8921 Chronic pain due to trauma: Secondary | ICD-10-CM

## 2014-02-20 MED ORDER — TRAMADOL HCL 50 MG PO TABS: 50.0000 mg | ORAL_TABLET | Freq: Four times a day (QID) | ORAL | Status: DC | PRN
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
## 2014-02-20 NOTE — ED Notes (Signed)
Reports having an ulcer on the buttocks that he is currently being treated for.  Pt states out of meds and not scheduled to see regular doctor until the 27th of may.  Having pain 10/10.

## 2014-02-20 NOTE — ED Notes (Signed)
Pt refused to sign discharge papers and declined prescribed tramadol medication.  Mw,cma

## 2014-02-20 NOTE — Discharge Instructions (Signed)
You must see your pain doctor for further medication refills.

## 2014-03-05 ENCOUNTER — Encounter (HOSPITAL_BASED_OUTPATIENT_CLINIC_OR_DEPARTMENT_OTHER): Payer: PRIVATE HEALTH INSURANCE | Attending: Internal Medicine

## 2014-03-05 DIAGNOSIS — L89309 Pressure ulcer of unspecified buttock, unspecified stage: Secondary | ICD-10-CM | POA: Insufficient documentation

## 2014-03-05 DIAGNOSIS — L8994 Pressure ulcer of unspecified site, stage 4: Secondary | ICD-10-CM | POA: Insufficient documentation

## 2014-03-19 ENCOUNTER — Emergency Department (HOSPITAL_COMMUNITY)
Admission: EM | Admit: 2014-03-19 | Discharge: 2014-03-19 | Disposition: A | Discharge status: Home / Self Care | Payer: PRIVATE HEALTH INSURANCE | Attending: Emergency Medicine | Admitting: Emergency Medicine

## 2014-03-19 ENCOUNTER — Encounter (HOSPITAL_COMMUNITY): Payer: Self-pay | Admitting: Emergency Medicine

## 2014-03-19 DIAGNOSIS — Z87828 Personal history of other (healed) physical injury and trauma: Secondary | ICD-10-CM | POA: Insufficient documentation

## 2014-03-19 DIAGNOSIS — I10 Essential (primary) hypertension: Secondary | ICD-10-CM | POA: Diagnosis not present

## 2014-03-19 DIAGNOSIS — L89303 Pressure ulcer of unspecified buttock, stage 3: Secondary | ICD-10-CM

## 2014-03-19 DIAGNOSIS — K219 Gastro-esophageal reflux disease without esophagitis: Secondary | ICD-10-CM | POA: Insufficient documentation

## 2014-03-19 DIAGNOSIS — L8993 Pressure ulcer of unspecified site, stage 3: Secondary | ICD-10-CM | POA: Insufficient documentation

## 2014-03-19 DIAGNOSIS — G8220 Paraplegia, unspecified: Secondary | ICD-10-CM

## 2014-03-19 DIAGNOSIS — R52 Pain, unspecified: Secondary | ICD-10-CM

## 2014-03-19 DIAGNOSIS — L98499 Non-pressure chronic ulcer of skin of other sites with unspecified severity: Secondary | ICD-10-CM | POA: Diagnosis present

## 2014-03-19 DIAGNOSIS — L89309 Pressure ulcer of unspecified buttock, unspecified stage: Secondary | ICD-10-CM | POA: Insufficient documentation

## 2014-03-19 DIAGNOSIS — Z79899 Other long term (current) drug therapy: Secondary | ICD-10-CM | POA: Diagnosis not present

## 2014-03-19 MED ORDER — OXYCODONE-ACETAMINOPHEN 5-325 MG PO TABS
2.0000 | ORAL_TABLET | Freq: Once | ORAL | Status: AC
  Administered 2014-03-19: 2 via ORAL
  Filled 2014-03-19: qty 2

## 2014-03-19 MED ORDER — OXYCODONE-ACETAMINOPHEN 7.5-325 MG PO TABS: 1.0000 | ORAL_TABLET | Freq: Four times a day (QID) | ORAL | Status: DC | PRN
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
## 2014-03-19 NOTE — Discharge Instructions (Signed)
1. Medications: percocer, usual home medications - we will be unable to fill you pain prescription any more.  Please see your PCP for this. 2. Treatment: rest, drink plenty of fluids,  3. Follow Up: Please followup with your primary doctor 1 week for discussion of your diagnoses and further evaluation after today's visit;    Pressure Ulcer A pressure ulcer is a sore that has formed from the breakdown of skin and exposure of deeper layers of tissue. It develops in areas of the body where there is unrelieved pressure. Pressure ulcers are usually found over a bony area, such as the shoulder blades, spine, lower back, hips, knees, ankles, and heels. Pressure ulcers vary in severity. Your health care provider may determine the severity (stage) of your pressure ulcer. The stages include:  Stage I--The skin is red, and when the skin is pressed, it stays red.  Stage II--The top layer of skin is gone, and there is a shallow, pink ulcer.  Stage III--The ulcer becomes deeper, and it is more difficult to see the whole wound. Also, there may be yellow or brown parts, as well as pink and red parts.  Stage IV--The ulcer may be deep and red, pink, brown, white, or yellow. Bone or muscle may be seen.  Unstageable pressure ulcer--The ulcer is covered almost completely with black, brown, or yellow tissue. It is not known how deep the ulcer is or what stage it is until this covering comes off.  Suspected deep tissue injury--A person's skin can be injured from pressure or pulling on the skin when his or her position is changed. The skin appears purple or maroon. There may not be an opening in the skin, but there could be a blood-filled blister. This deep tissue injury is often difficult to see in people with darker skin tones. The site may open and become deeper in time. However, early interventions will help the area heal and may prevent the area from opening. CAUSES  Pressure ulcers are caused by pressure against  the skin that limits the flow of blood to the skin and nearby tissues. There are many risk factors that can lead to pressure sores. RISK FACTORS  Decreased ability to move.  Decreased ability to feel pain or discomfort.  Excessive skin moisture from urine, stool, sweat, or secretions.  Poor nutrition.  Dehydration.  Tobacco, drug, or alcohol abuse.  Having someone pull on bedsheets that are under you, such as when health care workers are changing your position in a hospital bed.  Obesity.  Increased adult age.  Hospitalization in a critical care unit for longer than 4 days with use of medical devices.  Prolonged use of medical devices.  Critical illness.  Anemia.  Traumatic brain injury.  Spinal cord injury.  Stroke.  Diabetes.  Poor blood glucose control.  Low blood pressure (hypotension).  Low oxygen levels.  Medicines that reduce blood flow.  Infection. DIAGNOSIS  Your health care provider will diagnose your pressure ulcer based on its appearance. The health care provider may determine the stage of your pressure ulcer as well. Tests may be done to check for infection, to assess your circulation, or to check for other diseases, such as diabetes. TREATMENT  Treatment of your pressure ulcer begins with determining what stage the ulcer is in. Your treatment team may include your health care provider, a wound care specialist, a nutritionist, a physical therapist, and a Psychologist, sport and exercise. Possible treatments may include:   Moving or repositioning every 1-2 hours.  Using beds or mattresses to shift your body weight and pressure points frequently.  Improving your diet.  Cleaning and bandaging (dressing) the open wound.  Giving antibiotic medicines.  Removing damaged tissue.  Surgery and sometimes skin grafts. HOME CARE INSTRUCTIONS  If you were hospitalized, follow the care plan that was started in the hospital.  Avoid staying in the same position for more than 2  hours. Use padding, devices, or mattresses to cushion your pressure points as directed by your health care provider.  Eat a well-balanced diet. Take nutritional supplements and vitamins as directed by your health care provider.  Keep all follow-up appointments.  Only take over-the-counter or prescription medicines for pain, fever, or discomfort as directed by your health care provider. SEEK MEDICAL CARE IF:   Your pressure ulcer is not improving.  You do not know how to care for your pressure ulcer.  You notice other areas of redness on your skin.  You have a fever. SEEK IMMEDIATE MEDICAL CARE IF:   You have increasing redness, swelling, or pain in your pressure ulcer.  You notice pus coming from your pressure ulcer.  You notice a bad smell coming from the wound or dressing.  Your pressure ulcer opens up again. Document Released: 09/18/2005 Document Revised: 09/23/2013 Document Reviewed: 05/26/2013 [facility] Patient Information 2015 [place], [place]. This information is not intended to replace advice given to you by your health care provider. Make sure you discuss any questions you have with your health care provider.

## 2014-03-19 NOTE — ED Notes (Addendum)
Pt has ulcer on buttocks, states it is hurting and he does not have pain meds til next week. Ulcer is not new. No new problems with ulcer states it just hurts.

## 2014-03-20 NOTE — ED Provider Notes (Signed)
Medical screening examination/treatment/procedure(s) were performed by non-physician practitioner and as supervising physician I was immediately available for consultation/collaboration.   EKG Interpretation None       Virgel Manifold, MD 03/20/14 [phone]

## 2014-04-07 ENCOUNTER — Encounter (HOSPITAL_BASED_OUTPATIENT_CLINIC_OR_DEPARTMENT_OTHER): Payer: PRIVATE HEALTH INSURANCE | Attending: Internal Medicine

## 2014-05-22 ENCOUNTER — Encounter (HOSPITAL_COMMUNITY): Payer: Self-pay | Admitting: Emergency Medicine

## 2014-05-22 ENCOUNTER — Emergency Department (HOSPITAL_COMMUNITY): Payer: PRIVATE HEALTH INSURANCE

## 2014-05-22 ENCOUNTER — Emergency Department (HOSPITAL_COMMUNITY)
Admission: EM | Admit: 2014-05-22 | Discharge: 2014-05-22 | Disposition: A | Discharge status: Home / Self Care | Payer: PRIVATE HEALTH INSURANCE | Attending: Emergency Medicine | Admitting: Emergency Medicine

## 2014-05-22 DIAGNOSIS — S8990XA Unspecified injury of unspecified lower leg, initial encounter: Secondary | ICD-10-CM | POA: Insufficient documentation

## 2014-05-22 DIAGNOSIS — Y9289 Other specified places as the place of occurrence of the external cause: Secondary | ICD-10-CM | POA: Diagnosis not present

## 2014-05-22 DIAGNOSIS — Z8669 Personal history of other diseases of the nervous system and sense organs: Secondary | ICD-10-CM | POA: Diagnosis not present

## 2014-05-22 DIAGNOSIS — IMO0002 Reserved for concepts with insufficient information to code with codable children: Secondary | ICD-10-CM | POA: Insufficient documentation

## 2014-05-22 DIAGNOSIS — Y9389 Activity, other specified: Secondary | ICD-10-CM | POA: Diagnosis not present

## 2014-05-22 DIAGNOSIS — Z79899 Other long term (current) drug therapy: Secondary | ICD-10-CM | POA: Insufficient documentation

## 2014-05-22 DIAGNOSIS — I10 Essential (primary) hypertension: Secondary | ICD-10-CM | POA: Diagnosis not present

## 2014-05-22 DIAGNOSIS — K219 Gastro-esophageal reflux disease without esophagitis: Secondary | ICD-10-CM | POA: Diagnosis not present

## 2014-05-22 DIAGNOSIS — S99929A Unspecified injury of unspecified foot, initial encounter: Principal | ICD-10-CM

## 2014-05-22 DIAGNOSIS — Z993 Dependence on wheelchair: Secondary | ICD-10-CM | POA: Diagnosis not present

## 2014-05-22 DIAGNOSIS — Z87828 Personal history of other (healed) physical injury and trauma: Secondary | ICD-10-CM | POA: Diagnosis not present

## 2014-05-22 DIAGNOSIS — S99919A Unspecified injury of unspecified ankle, initial encounter: Secondary | ICD-10-CM | POA: Diagnosis not present

## 2014-05-22 DIAGNOSIS — S8991XA Unspecified injury of right lower leg, initial encounter: Secondary | ICD-10-CM

## 2014-05-22 MED ORDER — TRAMADOL HCL 50 MG PO TABS: 50.0000 mg | ORAL_TABLET | Freq: Every day | ORAL | Status: DC
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
## 2014-05-22 NOTE — Discharge Instructions (Signed)
You have been evaluated for your injury.  Fortunately no evidence of broken bone.  Follow instruction below.  Take tramadol for pain as needed.    RICE: Routine Care for Injuries The routine care of many injuries includes Rest, Ice, Compression, and Elevation (RICE). HOME CARE INSTRUCTIONS  Rest is needed to allow your body to heal. Routine activities can usually be resumed when comfortable. Injured tendons and bones can take up to 6 weeks to heal. Tendons are the cord-like structures that attach muscle to bone.  Ice following an injury helps keep the swelling down and reduces pain.  Put ice in a plastic bag.  Place a towel between your skin and the bag.  Leave the ice on for 15-20 minutes, 3-4 times a day, or as directed by your health care provider. Do this while awake, for the first 24 to 48 hours. After that, continue as directed by your caregiver.  Compression helps keep swelling down. It also gives support and helps with discomfort. If an elastic bandage has been applied, it should be removed and reapplied every 3 to 4 hours. It should not be applied tightly, but firmly enough to keep swelling down. Watch fingers or toes for swelling, bluish discoloration, coldness, numbness, or excessive pain. If any of these problems occur, remove the bandage and reapply loosely. Contact your caregiver if these problems continue.  Elevation helps reduce swelling and decreases pain. With extremities, such as the arms, hands, legs, and feet, the injured area should be placed near or above the level of the heart, if possible. SEEK IMMEDIATE MEDICAL CARE IF:  You have persistent pain and swelling.  You develop redness, numbness, or unexpected weakness.  Your symptoms are getting worse rather than improving after several days. These symptoms may indicate that further evaluation or further X-rays are needed. Sometimes, X-rays may not show a small broken bone (fracture) until 1 week or 10 days later. Make  a follow-up appointment with your caregiver. Ask when your X-ray results will be ready. Make sure you get your X-ray results. Document Released: 12/31/2000 Document Revised: 09/23/2013 Document Reviewed: 02/17/2011 [facility] Patient Information 2015 [place], [place]. This information is not intended to replace advice given to you by your health care provider. Make sure you discuss any questions you have with your health care provider.
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
## 2014-05-22 NOTE — ED Provider Notes (Signed)
Medical screening examination/treatment/procedure(s) were performed by non-physician practitioner and as supervising physician I was immediately available for consultation/collaboration.   EKG Interpretation None        Pamella Pert, MD 05/22/14 1706

## 2014-06-01 ENCOUNTER — Emergency Department (HOSPITAL_COMMUNITY): Payer: PRIVATE HEALTH INSURANCE

## 2014-06-01 ENCOUNTER — Encounter (HOSPITAL_COMMUNITY): Payer: Self-pay | Admitting: Emergency Medicine

## 2014-06-01 ENCOUNTER — Emergency Department (HOSPITAL_COMMUNITY)
Admission: EM | Admit: 2014-06-01 | Discharge: 2014-06-01 | Disposition: A | Discharge status: Home / Self Care | Payer: PRIVATE HEALTH INSURANCE | Attending: Emergency Medicine | Admitting: Emergency Medicine

## 2014-06-01 DIAGNOSIS — I10 Essential (primary) hypertension: Secondary | ICD-10-CM | POA: Diagnosis not present

## 2014-06-01 DIAGNOSIS — Z79899 Other long term (current) drug therapy: Secondary | ICD-10-CM | POA: Insufficient documentation

## 2014-06-01 DIAGNOSIS — G8911 Acute pain due to trauma: Secondary | ICD-10-CM | POA: Insufficient documentation

## 2014-06-01 DIAGNOSIS — M79609 Pain in unspecified limb: Secondary | ICD-10-CM | POA: Insufficient documentation

## 2014-06-01 DIAGNOSIS — Z8669 Personal history of other diseases of the nervous system and sense organs: Secondary | ICD-10-CM | POA: Diagnosis not present

## 2014-06-01 DIAGNOSIS — M79604 Pain in right leg: Secondary | ICD-10-CM

## 2014-06-01 DIAGNOSIS — G8220 Paraplegia, unspecified: Secondary | ICD-10-CM

## 2014-06-01 DIAGNOSIS — M7989 Other specified soft tissue disorders: Secondary | ICD-10-CM

## 2014-06-01 DIAGNOSIS — Z87828 Personal history of other (healed) physical injury and trauma: Secondary | ICD-10-CM | POA: Diagnosis not present

## 2014-06-01 DIAGNOSIS — K219 Gastro-esophageal reflux disease without esophagitis: Secondary | ICD-10-CM | POA: Insufficient documentation

## 2014-06-01 MED ORDER — CYCLOBENZAPRINE HCL 5 MG PO TABS: 5.0000 mg | ORAL_TABLET | Freq: Three times a day (TID) | ORAL | Status: DC | PRN

## 2014-06-01 NOTE — ED Notes (Signed)
Patient is alert and oriented x3.  He was given DC instructions and follow up visit instructions.  Patient gave verbal understanding.  He was DC via Environmental manager wheelchair under his own power to home. Patient refused V/S.  He was not showing any signs of distress on DC

## 2014-06-01 NOTE — Progress Notes (Signed)
Right lower extremity venous duplex completed.  Right:  No evidence of DVT, superficial thrombosis, or Baker's cyst.  Left:  Negative for DVT in the common femoral vein.  
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
## 2014-06-01 NOTE — Discharge Instructions (Signed)
You should have plenty of pain pills to take, you just got #120 oxycodone/acetaminophen on August 21st. Add the flexeril and call the orthopedist on call, Dr Trevor Mace office, to recheck your leg or see your primary care doctor in [place].
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
## 2014-06-01 NOTE — ED Notes (Signed)
Patient refused to go back in room and requested to have DC papers read in hallway

## 2014-07-09 ENCOUNTER — Encounter (HOSPITAL_BASED_OUTPATIENT_CLINIC_OR_DEPARTMENT_OTHER): Payer: PRIVATE HEALTH INSURANCE | Attending: Internal Medicine

## 2014-07-09 DIAGNOSIS — L89322 Pressure ulcer of left buttock, stage 2: Secondary | ICD-10-CM | POA: Insufficient documentation

## 2014-07-09 DIAGNOSIS — L89153 Pressure ulcer of sacral region, stage 3: Secondary | ICD-10-CM | POA: Insufficient documentation

## 2014-07-16 DIAGNOSIS — L89322 Pressure ulcer of left buttock, stage 2: Secondary | ICD-10-CM | POA: Diagnosis not present

## 2014-07-16 DIAGNOSIS — L89153 Pressure ulcer of sacral region, stage 3: Secondary | ICD-10-CM | POA: Diagnosis not present

## 2014-07-23 DIAGNOSIS — L89153 Pressure ulcer of sacral region, stage 3: Secondary | ICD-10-CM | POA: Diagnosis not present

## 2014-07-23 DIAGNOSIS — L89322 Pressure ulcer of left buttock, stage 2: Secondary | ICD-10-CM | POA: Diagnosis not present

## 2014-08-20 ENCOUNTER — Encounter (HOSPITAL_BASED_OUTPATIENT_CLINIC_OR_DEPARTMENT_OTHER): Payer: PRIVATE HEALTH INSURANCE | Attending: Internal Medicine

## 2014-09-03 ENCOUNTER — Encounter (HOSPITAL_BASED_OUTPATIENT_CLINIC_OR_DEPARTMENT_OTHER): Payer: PRIVATE HEALTH INSURANCE | Attending: Internal Medicine

## 2014-09-03 DIAGNOSIS — L89152 Pressure ulcer of sacral region, stage 2: Secondary | ICD-10-CM | POA: Insufficient documentation

## 2014-09-03 DIAGNOSIS — L89312 Pressure ulcer of right buttock, stage 2: Secondary | ICD-10-CM | POA: Insufficient documentation

## 2014-10-08 ENCOUNTER — Encounter (HOSPITAL_BASED_OUTPATIENT_CLINIC_OR_DEPARTMENT_OTHER): Payer: Medicare Other | Attending: Internal Medicine

## 2014-10-08 DIAGNOSIS — L89312 Pressure ulcer of right buttock, stage 2: Secondary | ICD-10-CM | POA: Insufficient documentation

## 2014-10-08 DIAGNOSIS — L89892 Pressure ulcer of other site, stage 2: Secondary | ICD-10-CM | POA: Diagnosis present

## 2014-10-08 DIAGNOSIS — L89323 Pressure ulcer of left buttock, stage 3: Secondary | ICD-10-CM | POA: Diagnosis not present

## 2014-11-05 ENCOUNTER — Encounter (HOSPITAL_BASED_OUTPATIENT_CLINIC_OR_DEPARTMENT_OTHER): Payer: Medicare Other | Attending: Internal Medicine

## 2014-11-05 DIAGNOSIS — L89894 Pressure ulcer of other site, stage 4: Secondary | ICD-10-CM | POA: Insufficient documentation

## 2014-11-05 DIAGNOSIS — L89312 Pressure ulcer of right buttock, stage 2: Secondary | ICD-10-CM | POA: Diagnosis present

## 2014-12-04 ENCOUNTER — Encounter (HOSPITAL_BASED_OUTPATIENT_CLINIC_OR_DEPARTMENT_OTHER): Payer: Medicare Other | Attending: Internal Medicine

## 2014-12-04 DIAGNOSIS — G8221 Paraplegia, complete: Secondary | ICD-10-CM | POA: Diagnosis not present

## 2014-12-04 DIAGNOSIS — L89329 Pressure ulcer of left buttock, unspecified stage: Secondary | ICD-10-CM | POA: Insufficient documentation

## 2014-12-04 DIAGNOSIS — A4901 Methicillin susceptible Staphylococcus aureus infection, unspecified site: Secondary | ICD-10-CM | POA: Diagnosis not present

## 2014-12-04 DIAGNOSIS — L89899 Pressure ulcer of other site, unspecified stage: Secondary | ICD-10-CM | POA: Diagnosis not present

## 2014-12-04 DIAGNOSIS — L89324 Pressure ulcer of left buttock, stage 4: Secondary | ICD-10-CM | POA: Insufficient documentation

## 2014-12-25 DIAGNOSIS — G8221 Paraplegia, complete: Secondary | ICD-10-CM | POA: Diagnosis not present

## 2014-12-25 DIAGNOSIS — L89324 Pressure ulcer of left buttock, stage 4: Secondary | ICD-10-CM | POA: Diagnosis not present

## 2014-12-25 DIAGNOSIS — L89899 Pressure ulcer of other site, unspecified stage: Secondary | ICD-10-CM | POA: Diagnosis not present

## 2014-12-25 DIAGNOSIS — L89329 Pressure ulcer of left buttock, unspecified stage: Secondary | ICD-10-CM | POA: Diagnosis not present

## 2015-01-22 ENCOUNTER — Encounter (HOSPITAL_BASED_OUTPATIENT_CLINIC_OR_DEPARTMENT_OTHER): Payer: Medicare Other | Attending: Internal Medicine

## 2015-01-22 DIAGNOSIS — L89324 Pressure ulcer of left buttock, stage 4: Secondary | ICD-10-CM | POA: Insufficient documentation

## 2015-01-22 DIAGNOSIS — L89312 Pressure ulcer of right buttock, stage 2: Secondary | ICD-10-CM | POA: Insufficient documentation

## 2015-01-29 DIAGNOSIS — L89312 Pressure ulcer of right buttock, stage 2: Secondary | ICD-10-CM | POA: Diagnosis not present

## 2015-01-29 DIAGNOSIS — L89324 Pressure ulcer of left buttock, stage 4: Secondary | ICD-10-CM | POA: Diagnosis present

## 2015-02-12 ENCOUNTER — Encounter (HOSPITAL_BASED_OUTPATIENT_CLINIC_OR_DEPARTMENT_OTHER): Payer: Medicare Other | Attending: Internal Medicine

## 2015-02-12 DIAGNOSIS — L89324 Pressure ulcer of left buttock, stage 4: Secondary | ICD-10-CM | POA: Insufficient documentation

## 2015-02-12 DIAGNOSIS — L89312 Pressure ulcer of right buttock, stage 2: Secondary | ICD-10-CM | POA: Insufficient documentation

## 2015-02-12 DIAGNOSIS — W3400XS Accidental discharge from unspecified firearms or gun, sequela: Secondary | ICD-10-CM | POA: Diagnosis not present

## 2015-02-12 DIAGNOSIS — G8221 Paraplegia, complete: Secondary | ICD-10-CM | POA: Diagnosis not present

## 2015-03-12 ENCOUNTER — Encounter (HOSPITAL_BASED_OUTPATIENT_CLINIC_OR_DEPARTMENT_OTHER): Payer: Medicare Other | Attending: General Surgery

## 2015-03-12 DIAGNOSIS — Z8614 Personal history of Methicillin resistant Staphylococcus aureus infection: Secondary | ICD-10-CM | POA: Diagnosis not present

## 2015-03-12 DIAGNOSIS — G8222 Paraplegia, incomplete: Secondary | ICD-10-CM | POA: Diagnosis not present

## 2015-03-12 DIAGNOSIS — L89313 Pressure ulcer of right buttock, stage 3: Secondary | ICD-10-CM | POA: Diagnosis present

## 2015-03-12 DIAGNOSIS — L89323 Pressure ulcer of left buttock, stage 3: Secondary | ICD-10-CM | POA: Insufficient documentation

## 2015-04-09 ENCOUNTER — Encounter (HOSPITAL_BASED_OUTPATIENT_CLINIC_OR_DEPARTMENT_OTHER): Payer: Medicare Other | Attending: Internal Medicine

## 2015-04-09 DIAGNOSIS — L89313 Pressure ulcer of right buttock, stage 3: Secondary | ICD-10-CM | POA: Diagnosis not present

## 2015-04-09 DIAGNOSIS — L8943 Pressure ulcer of contiguous site of back, buttock and hip, stage 3: Secondary | ICD-10-CM | POA: Insufficient documentation

## 2015-04-09 DIAGNOSIS — L89310 Pressure ulcer of right buttock, unstageable: Secondary | ICD-10-CM | POA: Insufficient documentation

## 2015-04-09 DIAGNOSIS — L89324 Pressure ulcer of left buttock, stage 4: Secondary | ICD-10-CM | POA: Insufficient documentation

## 2015-04-19 ENCOUNTER — Other Ambulatory Visit (HOSPITAL_COMMUNITY): Payer: Self-pay | Admitting: *Deleted

## 2015-04-19 ENCOUNTER — Other Ambulatory Visit: Payer: Self-pay | Admitting: Internal Medicine

## 2015-04-19 ENCOUNTER — Ambulatory Visit (HOSPITAL_COMMUNITY)
Admission: RE | Admit: 2015-04-19 | Discharge: 2015-04-19 | Disposition: A | Discharge status: Home / Self Care | Payer: Medicare Other | Source: Ambulatory Visit | Attending: Internal Medicine | Admitting: Internal Medicine

## 2015-04-19 DIAGNOSIS — M869 Osteomyelitis, unspecified: Secondary | ICD-10-CM

## 2015-04-19 DIAGNOSIS — G8220 Paraplegia, unspecified: Secondary | ICD-10-CM | POA: Diagnosis not present

## 2015-04-21 DIAGNOSIS — L89324 Pressure ulcer of left buttock, stage 4: Secondary | ICD-10-CM | POA: Diagnosis not present

## 2015-04-28 DIAGNOSIS — L89324 Pressure ulcer of left buttock, stage 4: Secondary | ICD-10-CM | POA: Diagnosis not present

## 2015-05-12 ENCOUNTER — Encounter (HOSPITAL_BASED_OUTPATIENT_CLINIC_OR_DEPARTMENT_OTHER): Payer: Medicare Other | Attending: Internal Medicine

## 2015-05-12 DIAGNOSIS — L89313 Pressure ulcer of right buttock, stage 3: Secondary | ICD-10-CM | POA: Insufficient documentation

## 2015-05-12 DIAGNOSIS — L89324 Pressure ulcer of left buttock, stage 4: Secondary | ICD-10-CM | POA: Diagnosis not present

## 2015-05-12 DIAGNOSIS — G8220 Paraplegia, unspecified: Secondary | ICD-10-CM | POA: Insufficient documentation

## 2015-05-12 DIAGNOSIS — L8943 Pressure ulcer of contiguous site of back, buttock and hip, stage 3: Secondary | ICD-10-CM | POA: Insufficient documentation

## 2015-05-19 DIAGNOSIS — L89154 Pressure ulcer of sacral region, stage 4: Secondary | ICD-10-CM

## 2015-06-16 ENCOUNTER — Encounter (HOSPITAL_BASED_OUTPATIENT_CLINIC_OR_DEPARTMENT_OTHER): Payer: Medicare Other | Attending: Internal Medicine

## 2015-07-06 ENCOUNTER — Encounter (HOSPITAL_BASED_OUTPATIENT_CLINIC_OR_DEPARTMENT_OTHER): Payer: Medicare Other | Attending: General Surgery

## 2015-07-06 DIAGNOSIS — L89892 Pressure ulcer of other site, stage 2: Secondary | ICD-10-CM | POA: Insufficient documentation

## 2015-07-06 DIAGNOSIS — F17200 Nicotine dependence, unspecified, uncomplicated: Secondary | ICD-10-CM | POA: Diagnosis not present

## 2015-07-06 DIAGNOSIS — L89324 Pressure ulcer of left buttock, stage 4: Secondary | ICD-10-CM | POA: Diagnosis present

## 2015-07-06 DIAGNOSIS — L89313 Pressure ulcer of right buttock, stage 3: Secondary | ICD-10-CM | POA: Insufficient documentation

## 2015-08-05 ENCOUNTER — Encounter (HOSPITAL_COMMUNITY): Payer: Self-pay | Admitting: Emergency Medicine

## 2015-08-05 ENCOUNTER — Emergency Department (HOSPITAL_COMMUNITY)
Admission: EM | Admit: 2015-08-05 | Discharge: 2015-08-06 | Disposition: A | Discharge status: Home / Self Care | Payer: Medicare Other | Attending: Emergency Medicine | Admitting: Emergency Medicine

## 2015-08-05 DIAGNOSIS — Z792 Long term (current) use of antibiotics: Secondary | ICD-10-CM | POA: Insufficient documentation

## 2015-08-05 DIAGNOSIS — Y92009 Unspecified place in unspecified non-institutional (private) residence as the place of occurrence of the external cause: Secondary | ICD-10-CM | POA: Insufficient documentation

## 2015-08-05 DIAGNOSIS — I10 Essential (primary) hypertension: Secondary | ICD-10-CM | POA: Insufficient documentation

## 2015-08-05 DIAGNOSIS — K219 Gastro-esophageal reflux disease without esophagitis: Secondary | ICD-10-CM | POA: Diagnosis not present

## 2015-08-05 DIAGNOSIS — S3992XA Unspecified injury of lower back, initial encounter: Secondary | ICD-10-CM | POA: Diagnosis not present

## 2015-08-05 DIAGNOSIS — G8929 Other chronic pain: Secondary | ICD-10-CM | POA: Insufficient documentation

## 2015-08-05 DIAGNOSIS — F1110 Opioid abuse, uncomplicated: Secondary | ICD-10-CM | POA: Diagnosis not present

## 2015-08-05 DIAGNOSIS — Z720 Tobacco use: Secondary | ICD-10-CM | POA: Insufficient documentation

## 2015-08-05 DIAGNOSIS — L89159 Pressure ulcer of sacral region, unspecified stage: Secondary | ICD-10-CM | POA: Insufficient documentation

## 2015-08-05 DIAGNOSIS — G8220 Paraplegia, unspecified: Secondary | ICD-10-CM | POA: Insufficient documentation

## 2015-08-05 DIAGNOSIS — Y9389 Activity, other specified: Secondary | ICD-10-CM | POA: Diagnosis not present

## 2015-08-05 DIAGNOSIS — Y998 Other external cause status: Secondary | ICD-10-CM | POA: Insufficient documentation

## 2015-08-05 DIAGNOSIS — F1310 Sedative, hypnotic or anxiolytic abuse, uncomplicated: Secondary | ICD-10-CM | POA: Diagnosis not present

## 2015-08-05 DIAGNOSIS — W1839XA Other fall on same level, initial encounter: Secondary | ICD-10-CM | POA: Insufficient documentation

## 2015-08-05 DIAGNOSIS — Z79899 Other long term (current) drug therapy: Secondary | ICD-10-CM | POA: Diagnosis not present

## 2015-08-05 DIAGNOSIS — Z87828 Personal history of other (healed) physical injury and trauma: Secondary | ICD-10-CM | POA: Insufficient documentation

## 2015-08-05 DIAGNOSIS — F1410 Cocaine abuse, uncomplicated: Secondary | ICD-10-CM | POA: Insufficient documentation

## 2015-08-05 DIAGNOSIS — W19XXXA Unspecified fall, initial encounter: Secondary | ICD-10-CM

## 2015-08-05 LAB — ETHANOL: Alcohol, Ethyl (B): <5 mg/dL (ref <5)

## 2015-08-05 NOTE — ED Notes (Signed)
Patient falling asleep multiple times during triage.  Patient is responsive to voice but frequently falls asleep.

## 2015-08-05 NOTE — ED Notes (Signed)
Bed: KG88 Expected date:  Expected time:  Means of arrival:  Comments: EMS 49yo M Fall open wounds with drainage; UTI

## 2015-08-05 NOTE — ED Notes (Signed)
Per [facility] patient lives by self.  Patient fell today-[employer] and [employer] went to patient's house but patient declined to go to hospital.  Spouse insisted patient go to ER for fall-patient then decided he would come-EMS then picked up patient. Per pt-he has multiple wounds to back side which were managed by wound specialist but patient has not seen them "in awhile".
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
## 2015-08-06 DIAGNOSIS — S3992XA Unspecified injury of lower back, initial encounter: Secondary | ICD-10-CM | POA: Diagnosis not present

## 2015-08-06 LAB — URINALYSIS, ROUTINE W REFLEX MICROSCOPIC
Bilirubin Urine: NEGATIVE
Glucose, UA: NEGATIVE mg/dL
Ketones, ur: NEGATIVE mg/dL
Nitrite: POSITIVE — AB
PROTEIN: NEGATIVE mg/dL
Specific Gravity, Urine: 1.017 (ref 1.005–1.030)
Urobilinogen, UA: 1 mg/dL (ref 0.0–1.0)
pH: 5.5 (ref 5.0–8.0)

## 2015-08-06 LAB — URINE MICROSCOPIC-ADD ON

## 2015-08-06 LAB — COMPREHENSIVE METABOLIC PANEL
ALT: 11 U/L — ABNORMAL LOW (ref 17–63)
AST: 15 U/L (ref 15–41)
Albumin: 3.7 g/dL (ref 3.5–5.0)
Alkaline Phosphatase: 64 U/L (ref 38–126)
Anion gap: 6 (ref 5–15)
BUN: 10 mg/dL (ref 6–20)
CO2: 32 mmol/L (ref 22–32)
CREATININE: 0.79 mg/dL (ref 0.61–1.24)
Calcium: 8.6 mg/dL — ABNORMAL LOW (ref 8.9–10.3)
Chloride: 102 mmol/L (ref 101–111)
GFR calc Af Amer: >60 mL/min (ref >60)
Glucose, Bld: 108 mg/dL — ABNORMAL HIGH (ref 65–99)
Potassium: 3.1 mmol/L — ABNORMAL LOW (ref 3.5–5.1)
Sodium: 140 mmol/L (ref 135–145)
Total Bilirubin: 0.5 mg/dL (ref 0.3–1.2)
Total Protein: 7.4 g/dL (ref 6.5–8.1)

## 2015-08-06 LAB — CBC WITH DIFFERENTIAL/PLATELET
BASOS ABS: 0 10*3/uL (ref 0.0–0.1)
BASOS PCT: 0 %
Eosinophils Absolute: 0.2 10*3/uL (ref 0.0–0.7)
Eosinophils Relative: 2 %
HCT: 39.5 % (ref 39.0–52.0)
Hemoglobin: 12.4 g/dL — ABNORMAL LOW (ref 13.0–17.0)
Lymphocytes Relative: 26 %
Lymphs Abs: 2.1 10*3/uL (ref 0.7–4.0)
MCH: 27.5 pg (ref 26.0–34.0)
MCHC: 31.4 g/dL (ref 30.0–36.0)
MCV: 87.6 fL (ref 78.0–100.0)
Monocytes Absolute: 0.7 10*3/uL (ref 0.1–1.0)
Monocytes Relative: 8 %
Neutro Abs: 5.1 10*3/uL (ref 1.7–7.7)
Neutrophils Relative %: 64 %
PLATELETS: 284 10*3/uL (ref 150–400)
RBC: 4.51 MIL/uL (ref 4.22–5.81)
RDW: 15.5 % (ref 11.5–15.5)
WBC: 8.1 10*3/uL (ref 4.0–10.5)

## 2015-08-06 LAB — RAPID URINE DRUG SCREEN, HOSP PERFORMED
AMPHETAMINES: NOT DETECTED
Barbiturates: NOT DETECTED
Benzodiazepines: POSITIVE — AB
Cocaine: POSITIVE — AB
Opiates: POSITIVE — AB
Tetrahydrocannabinol: NOT DETECTED

## 2015-08-06 LAB — CK: CK TOTAL: 104 U/L (ref 49–397)

## 2015-08-06 MED ORDER — POTASSIUM CHLORIDE CRYS ER 20 MEQ PO TBCR
40.0000 meq | EXTENDED_RELEASE_TABLET | Freq: Once | ORAL | Status: AC
  Administered 2015-08-06: 40 meq via ORAL
  Filled 2015-08-06: qty 2

## 2015-08-06 MED ORDER — CEPHALEXIN 500 MG PO CAPS: 500.0000 mg | ORAL_CAPSULE | Freq: Four times a day (QID) | ORAL | Status: DC

## 2015-08-06 MED ORDER — SODIUM CHLORIDE 0.9 % IV BOLUS (SEPSIS): 1000.0000 mL | Freq: Once | INTRAVENOUS | Status: DC

## 2015-08-06 MED ORDER — CEPHALEXIN 500 MG PO CAPS
500.0000 mg | ORAL_CAPSULE | Freq: Once | ORAL | Status: AC
  Administered 2015-08-06: 500 mg via ORAL
  Filled 2015-08-06: qty 1

## 2015-08-06 NOTE — ED Notes (Signed)
Per ED PA okay for patient to self-cath.  Patient refusing in and out cath by ED staff.  Patient states he has done this for 35 years and he will be the one who continues to do it.

## 2015-08-06 NOTE — ED Notes (Addendum)
Pt sleepy and barely can keep eyes open during this RN's exam.  Pt was provided a large pitcher of water and encouraged to drink. He states " okay" when asked to drink but then goes back to sleep.

## 2015-08-06 NOTE — ED Notes (Signed)
Pt wife is here to take patient home.

## 2015-08-06 NOTE — ED Notes (Addendum)
This RN reviewed pt's discharge papers with him. Pt was educated on the importance of filling and taking the full course of the antibiotic prescribed. It is planned that social work is to contact patient and set up for home health. Pt was made aware of this. This RN asks how he gets around at home and does he have a wheelchair  And patient sarcastically states " no, I walk"  (pt is a paraplegic).When asked if he had got in touch with his wife he states "yeah she is coming".  Pt unable to give ETA of wife. This RN offers to assist patient getting dressed and he declines assistance. Pt refused to sign e-signature pad. He refuses to answer anymore questions from this RN. He ignores this RN when questions are asked.

## 2015-08-06 NOTE — ED Notes (Signed)
Pt got in touch with wife. He asking her for his keys so he can go home.

## 2015-08-06 NOTE — ED Notes (Signed)
Per ED PA okay to hold on IV until labs result.  Patient given sprite to drink.
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
## 2015-08-06 NOTE — Discharge Instructions (Signed)
SOCIAL WORK WILL BE IN CONTACT WITH YOU REGARDING RE-ESTABLISHING IN-HOME HEALTH AID TO ASSIST YOU IN DAILY NEEDS.   Urinary Tract Infection Urinary tract infections (UTIs) can develop anywhere along your urinary tract. Your urinary tract is your body's drainage system for removing wastes and extra water. Your urinary tract includes two kidneys, two ureters, a bladder, and a urethra. Your kidneys are a pair of bean-shaped organs. Each kidney is about the size of your fist. They are located below your ribs, one on each side of your spine. CAUSES Infections are caused by microbes, which are microscopic organisms, including fungi, viruses, and bacteria. These organisms are so small that they can only be seen through a microscope. Bacteria are the microbes that most commonly cause UTIs. SYMPTOMS  Symptoms of UTIs may vary by age and gender of the patient and by the location of the infection. Symptoms in young women typically include a frequent and intense urge to urinate and a painful, burning feeling in the bladder or urethra during urination. Older women and men are more likely to be tired, shaky, and weak and have muscle aches and abdominal pain. A fever may mean the infection is in your kidneys. Other symptoms of a kidney infection include pain in your back or sides below the ribs, nausea, and vomiting. DIAGNOSIS To diagnose a UTI, your caregiver will ask you about your symptoms. Your caregiver will also ask you to provide a urine sample. The urine sample will be tested for bacteria and white blood cells. White blood cells are made by your body to help fight infection. TREATMENT  Typically, UTIs can be treated with medication. Because most UTIs are caused by a bacterial infection, they usually can be treated with the use of antibiotics. The choice of antibiotic and length of treatment depend on your symptoms and the type of bacteria causing your infection. HOME CARE INSTRUCTIONS  If you were prescribed  antibiotics, take them exactly as your caregiver instructs you. Finish the medication even if you feel better after you have only taken some of the medication.  Drink enough water and fluids to keep your urine clear or pale yellow.  Avoid caffeine, tea, and carbonated beverages. They tend to irritate your bladder.  Empty your bladder often. Avoid holding urine for long periods of time.  Empty your bladder before and after sexual intercourse.  After a bowel movement, women should cleanse from front to back. Use each tissue only once. SEEK MEDICAL CARE IF:   You have back pain.  You develop a fever.  Your symptoms do not begin to resolve within 3 days. SEEK IMMEDIATE MEDICAL CARE IF:   You have severe back pain or lower abdominal pain.  You develop chills.  You have nausea or vomiting.  You have continued burning or discomfort with urination. MAKE SURE YOU:   Understand these instructions.  Will watch your condition.  Will get help right away if you are not doing well or get worse.   This information is not intended to replace advice given to you by your health care provider. Make sure you discuss any questions you have with your health care provider.   Document Released: 06/28/2005 Document Revised: 06/09/2015 Document Reviewed: 10/27/2011 Elsevier Interactive Patient Education [employer].

## 2015-08-06 NOTE — ED Notes (Signed)
Pt calling for a ride

## 2015-08-08 LAB — URINE CULTURE
Culture: >=100000
Special Requests: NORMAL

## 2015-08-10 ENCOUNTER — Telehealth (HOSPITAL_COMMUNITY): Payer: Self-pay

## 2015-08-10 NOTE — Telephone Encounter (Signed)
Post ED Visit - Positive Culture Follow-up  Culture report reviewed by antimicrobial stewardship pharmacist:  []  Elenor Quinones, Pharm.D. []  Heide Guile, Pharm.D., BCPS []  Parks Neptune, Pharm.D. []  Alycia Rossetti, Pharm.D., BCPS []  [place], Pharm.D., BCPS, AAHIVP []  Legrand Como, Pharm.D., BCPS, AAHIVP []  Milus Glazier, Pharm.D. [x]  Rob Evette Doffing, [place].D.  Positive urine culture>/= 100,000 colonies -> E Coli Treated with Cephalexin, organism sensitive to the same and no further patient follow-up is required at this time.  Dortha Kern 08/10/2015, 5:56 AM

## 2015-09-28 ENCOUNTER — Encounter (HOSPITAL_BASED_OUTPATIENT_CLINIC_OR_DEPARTMENT_OTHER): Payer: Medicare Other | Attending: General Surgery

## 2015-09-28 DIAGNOSIS — L89303 Pressure ulcer of unspecified buttock, stage 3: Secondary | ICD-10-CM | POA: Diagnosis not present

## 2015-09-28 DIAGNOSIS — L89152 Pressure ulcer of sacral region, stage 2: Secondary | ICD-10-CM | POA: Insufficient documentation

## 2015-09-28 DIAGNOSIS — G8220 Paraplegia, unspecified: Secondary | ICD-10-CM | POA: Diagnosis not present

## 2015-09-28 DIAGNOSIS — L89312 Pressure ulcer of right buttock, stage 2: Secondary | ICD-10-CM | POA: Insufficient documentation

## 2015-09-28 DIAGNOSIS — L89893 Pressure ulcer of other site, stage 3: Secondary | ICD-10-CM | POA: Diagnosis not present

## 2015-09-28 DIAGNOSIS — L89314 Pressure ulcer of right buttock, stage 4: Secondary | ICD-10-CM | POA: Diagnosis present

## 2015-10-12 ENCOUNTER — Encounter (HOSPITAL_BASED_OUTPATIENT_CLINIC_OR_DEPARTMENT_OTHER): Payer: Medicare Other | Attending: Internal Medicine

## 2015-11-05 ENCOUNTER — Encounter (HOSPITAL_BASED_OUTPATIENT_CLINIC_OR_DEPARTMENT_OTHER): Payer: Medicare Other | Attending: Internal Medicine

## 2015-11-05 DIAGNOSIS — L89312 Pressure ulcer of right buttock, stage 2: Secondary | ICD-10-CM | POA: Insufficient documentation

## 2015-11-05 DIAGNOSIS — Z9119 Patient's noncompliance with other medical treatment and regimen: Secondary | ICD-10-CM | POA: Diagnosis not present

## 2015-11-05 DIAGNOSIS — L89323 Pressure ulcer of left buttock, stage 3: Secondary | ICD-10-CM | POA: Diagnosis not present

## 2015-11-05 DIAGNOSIS — L89314 Pressure ulcer of right buttock, stage 4: Secondary | ICD-10-CM | POA: Insufficient documentation

## 2015-11-05 DIAGNOSIS — G8221 Paraplegia, complete: Secondary | ICD-10-CM | POA: Insufficient documentation

## 2015-11-05 DIAGNOSIS — F17200 Nicotine dependence, unspecified, uncomplicated: Secondary | ICD-10-CM | POA: Insufficient documentation

## 2015-11-23 DIAGNOSIS — L89314 Pressure ulcer of right buttock, stage 4: Secondary | ICD-10-CM | POA: Diagnosis not present

## 2015-12-07 ENCOUNTER — Encounter (HOSPITAL_BASED_OUTPATIENT_CLINIC_OR_DEPARTMENT_OTHER): Payer: Medicare Other | Attending: Surgery

## 2015-12-07 DIAGNOSIS — L89314 Pressure ulcer of right buttock, stage 4: Secondary | ICD-10-CM | POA: Diagnosis not present

## 2015-12-07 DIAGNOSIS — L89323 Pressure ulcer of left buttock, stage 3: Secondary | ICD-10-CM | POA: Insufficient documentation

## 2015-12-07 DIAGNOSIS — G8220 Paraplegia, unspecified: Secondary | ICD-10-CM | POA: Diagnosis not present

## 2015-12-07 DIAGNOSIS — L89312 Pressure ulcer of right buttock, stage 2: Secondary | ICD-10-CM | POA: Diagnosis not present

## 2016-01-04 ENCOUNTER — Encounter (HOSPITAL_BASED_OUTPATIENT_CLINIC_OR_DEPARTMENT_OTHER): Payer: Medicare Other | Attending: Surgery

## 2016-01-04 DIAGNOSIS — L89314 Pressure ulcer of right buttock, stage 4: Secondary | ICD-10-CM | POA: Diagnosis present

## 2016-01-04 DIAGNOSIS — L89324 Pressure ulcer of left buttock, stage 4: Secondary | ICD-10-CM | POA: Diagnosis not present

## 2016-01-04 DIAGNOSIS — G8221 Paraplegia, complete: Secondary | ICD-10-CM | POA: Insufficient documentation

## 2016-01-04 DIAGNOSIS — F17200 Nicotine dependence, unspecified, uncomplicated: Secondary | ICD-10-CM | POA: Insufficient documentation

## 2016-03-28 ENCOUNTER — Encounter (HOSPITAL_BASED_OUTPATIENT_CLINIC_OR_DEPARTMENT_OTHER): Payer: Medicare Other

## 2016-05-26 ENCOUNTER — Inpatient Hospital Stay (HOSPITAL_COMMUNITY)
Admission: EM | Admit: 2016-05-26 | Discharge: 2016-06-04 | DRG: 463 | Disposition: A | Discharge status: Skilled Nursing Facility | Payer: Medicare Other | Attending: Internal Medicine | Admitting: Internal Medicine

## 2016-05-26 ENCOUNTER — Encounter (HOSPITAL_COMMUNITY): Payer: Self-pay | Admitting: Nurse Practitioner

## 2016-05-26 ENCOUNTER — Emergency Department (HOSPITAL_COMMUNITY): Payer: Medicare Other

## 2016-05-26 DIAGNOSIS — M6282 Rhabdomyolysis: Secondary | ICD-10-CM | POA: Diagnosis present

## 2016-05-26 DIAGNOSIS — L89159 Pressure ulcer of sacral region, unspecified stage: Secondary | ICD-10-CM

## 2016-05-26 DIAGNOSIS — I452 Bifascicular block: Secondary | ICD-10-CM | POA: Diagnosis present

## 2016-05-26 DIAGNOSIS — M4628 Osteomyelitis of vertebra, sacral and sacrococcygeal region: Secondary | ICD-10-CM | POA: Diagnosis present

## 2016-05-26 DIAGNOSIS — L8990 Pressure ulcer of unspecified site, unspecified stage: Secondary | ICD-10-CM | POA: Diagnosis present

## 2016-05-26 DIAGNOSIS — N179 Acute kidney failure, unspecified: Secondary | ICD-10-CM | POA: Diagnosis present

## 2016-05-26 DIAGNOSIS — E876 Hypokalemia: Secondary | ICD-10-CM | POA: Diagnosis present

## 2016-05-26 DIAGNOSIS — D72829 Elevated white blood cell count, unspecified: Secondary | ICD-10-CM | POA: Diagnosis not present

## 2016-05-26 DIAGNOSIS — L89313 Pressure ulcer of right buttock, stage 3: Secondary | ICD-10-CM | POA: Diagnosis present

## 2016-05-26 DIAGNOSIS — F1410 Cocaine abuse, uncomplicated: Secondary | ICD-10-CM | POA: Diagnosis present

## 2016-05-26 DIAGNOSIS — I4581 Long QT syndrome: Secondary | ICD-10-CM

## 2016-05-26 DIAGNOSIS — Y92009 Unspecified place in unspecified non-institutional (private) residence as the place of occurrence of the external cause: Secondary | ICD-10-CM | POA: Diagnosis not present

## 2016-05-26 DIAGNOSIS — T148XXA Other injury of unspecified body region, initial encounter: Secondary | ICD-10-CM

## 2016-05-26 DIAGNOSIS — L89150 Pressure ulcer of sacral region, unstageable: Secondary | ICD-10-CM | POA: Diagnosis present

## 2016-05-26 DIAGNOSIS — W050XXA Fall from non-moving wheelchair, initial encounter: Secondary | ICD-10-CM | POA: Diagnosis present

## 2016-05-26 DIAGNOSIS — Z841 Family history of disorders of kidney and ureter: Secondary | ICD-10-CM

## 2016-05-26 DIAGNOSIS — E43 Unspecified severe protein-calorie malnutrition: Secondary | ICD-10-CM | POA: Diagnosis present

## 2016-05-26 DIAGNOSIS — Z809 Family history of malignant neoplasm, unspecified: Secondary | ICD-10-CM

## 2016-05-26 DIAGNOSIS — F17210 Nicotine dependence, cigarettes, uncomplicated: Secondary | ICD-10-CM | POA: Diagnosis present

## 2016-05-26 DIAGNOSIS — Q438 Other specified congenital malformations of intestine: Secondary | ICD-10-CM | POA: Diagnosis not present

## 2016-05-26 DIAGNOSIS — R627 Adult failure to thrive: Secondary | ICD-10-CM | POA: Diagnosis present

## 2016-05-26 DIAGNOSIS — N319 Neuromuscular dysfunction of bladder, unspecified: Secondary | ICD-10-CM | POA: Diagnosis present

## 2016-05-26 DIAGNOSIS — D638 Anemia in other chronic diseases classified elsewhere: Secondary | ICD-10-CM | POA: Diagnosis present

## 2016-05-26 DIAGNOSIS — L089 Local infection of the skin and subcutaneous tissue, unspecified: Secondary | ICD-10-CM | POA: Diagnosis not present

## 2016-05-26 DIAGNOSIS — T796XXA Traumatic ischemia of muscle, initial encounter: Secondary | ICD-10-CM | POA: Diagnosis present

## 2016-05-26 DIAGNOSIS — Z933 Colostomy status: Secondary | ICD-10-CM

## 2016-05-26 DIAGNOSIS — D62 Acute posthemorrhagic anemia: Secondary | ICD-10-CM | POA: Diagnosis not present

## 2016-05-26 DIAGNOSIS — N390 Urinary tract infection, site not specified: Secondary | ICD-10-CM

## 2016-05-26 DIAGNOSIS — I96 Gangrene, not elsewhere classified: Secondary | ICD-10-CM | POA: Diagnosis present

## 2016-05-26 DIAGNOSIS — E669 Obesity, unspecified: Secondary | ICD-10-CM | POA: Diagnosis present

## 2016-05-26 DIAGNOSIS — G8220 Paraplegia, unspecified: Secondary | ICD-10-CM | POA: Diagnosis present

## 2016-05-26 DIAGNOSIS — G8929 Other chronic pain: Secondary | ICD-10-CM | POA: Diagnosis present

## 2016-05-26 DIAGNOSIS — R4182 Altered mental status, unspecified: Secondary | ICD-10-CM | POA: Diagnosis present

## 2016-05-26 DIAGNOSIS — I4891 Unspecified atrial fibrillation: Secondary | ICD-10-CM | POA: Diagnosis present

## 2016-05-26 DIAGNOSIS — R7982 Elevated C-reactive protein (CRP): Secondary | ICD-10-CM | POA: Diagnosis present

## 2016-05-26 DIAGNOSIS — B9689 Other specified bacterial agents as the cause of diseases classified elsewhere: Secondary | ICD-10-CM | POA: Diagnosis not present

## 2016-05-26 DIAGNOSIS — I10 Essential (primary) hypertension: Secondary | ICD-10-CM | POA: Diagnosis present

## 2016-05-26 DIAGNOSIS — Z993 Dependence on wheelchair: Secondary | ICD-10-CM

## 2016-05-26 DIAGNOSIS — E860 Dehydration: Secondary | ICD-10-CM | POA: Diagnosis present

## 2016-05-26 DIAGNOSIS — D649 Anemia, unspecified: Secondary | ICD-10-CM | POA: Diagnosis present

## 2016-05-26 DIAGNOSIS — D509 Iron deficiency anemia, unspecified: Secondary | ICD-10-CM | POA: Diagnosis not present

## 2016-05-26 DIAGNOSIS — K219 Gastro-esophageal reflux disease without esophagitis: Secondary | ICD-10-CM | POA: Diagnosis present

## 2016-05-26 DIAGNOSIS — Z79899 Other long term (current) drug therapy: Secondary | ICD-10-CM

## 2016-05-26 DIAGNOSIS — R9431 Abnormal electrocardiogram [ECG] [EKG]: Secondary | ICD-10-CM | POA: Diagnosis present

## 2016-05-26 DIAGNOSIS — R159 Full incontinence of feces: Secondary | ICD-10-CM | POA: Diagnosis present

## 2016-05-26 DIAGNOSIS — T148 Other injury of unspecified body region: Secondary | ICD-10-CM | POA: Diagnosis not present

## 2016-05-26 DIAGNOSIS — Z6836 Body mass index (BMI) 36.0-36.9, adult: Secondary | ICD-10-CM

## 2016-05-26 DIAGNOSIS — S31000A Unspecified open wound of lower back and pelvis without penetration into retroperitoneum, initial encounter: Secondary | ICD-10-CM

## 2016-05-26 DIAGNOSIS — M868X8 Other osteomyelitis, other site: Secondary | ICD-10-CM | POA: Diagnosis not present

## 2016-05-26 HISTORY — DX: Unspecified severe protein-calorie malnutrition: E43

## 2016-05-26 HISTORY — DX: Other chronic pain: G89.29

## 2016-05-26 HISTORY — DX: Pressure ulcer of unspecified site, unspecified stage: L89.90

## 2016-05-26 HISTORY — DX: Cocaine use, unspecified, uncomplicated: F14.90

## 2016-05-26 LAB — COMPREHENSIVE METABOLIC PANEL
ALT: 34 U/L (ref 17–63)
AST: 45 U/L — AB (ref 15–41)
Albumin: 2.6 g/dL — ABNORMAL LOW (ref 3.5–5.0)
Alkaline Phosphatase: 105 U/L (ref 38–126)
Anion gap: 20 — ABNORMAL HIGH (ref 5–15)
BILIRUBIN TOTAL: 1.1 mg/dL (ref 0.3–1.2)
BUN: 66 mg/dL — AB (ref 6–20)
CALCIUM: 9 mg/dL (ref 8.9–10.3)
CO2: 19 mmol/L — ABNORMAL LOW (ref 22–32)
Chloride: 101 mmol/L (ref 101–111)
Creatinine, Ser: 5 mg/dL — ABNORMAL HIGH (ref 0.61–1.24)
GFR calc Af Amer: 14 mL/min — ABNORMAL LOW (ref >60)
GFR, EST NON AFRICAN AMERICAN: 12 mL/min — AB (ref >60)
Glucose, Bld: 90 mg/dL (ref 65–99)
POTASSIUM: 3.1 mmol/L — AB (ref 3.5–5.1)
Sodium: 140 mmol/L (ref 135–145)
TOTAL PROTEIN: 8.9 g/dL — AB (ref 6.5–8.1)

## 2016-05-26 LAB — I-STAT CG4 LACTIC ACID, ED: Lactic Acid, Venous: 2.12 mmol/L (ref 0.5–1.9)

## 2016-05-26 LAB — URINALYSIS, ROUTINE W REFLEX MICROSCOPIC
Glucose, UA: NEGATIVE mg/dL
Ketones, ur: 15 mg/dL — AB
NITRITE: NEGATIVE
Protein, ur: 100 mg/dL — AB
SPECIFIC GRAVITY, URINE: 1.023 (ref 1.005–1.030)
pH: 5.5 (ref 5.0–8.0)

## 2016-05-26 LAB — CBC WITH DIFFERENTIAL/PLATELET
BASOS ABS: 0 10*3/uL (ref 0.0–0.1)
BASOS PCT: 0 %
EOS PCT: 0 %
Eosinophils Absolute: 0 10*3/uL (ref 0.0–0.7)
HEMATOCRIT: 32.7 % — AB (ref 39.0–52.0)
Hemoglobin: 10.7 g/dL — ABNORMAL LOW (ref 13.0–17.0)
LYMPHS ABS: 1.6 10*3/uL (ref 0.7–4.0)
Lymphocytes Relative: 6 %
MCH: 27.4 pg (ref 26.0–34.0)
MCHC: 32.7 g/dL (ref 30.0–36.0)
MCV: 83.8 fL (ref 78.0–100.0)
MONOS PCT: 5 %
Monocytes Absolute: 1.3 10*3/uL — ABNORMAL HIGH (ref 0.1–1.0)
NEUTROS ABS: 23.8 10*3/uL — AB (ref 1.7–7.7)
Neutrophils Relative %: 89 %
Platelets: 495 10*3/uL — ABNORMAL HIGH (ref 150–400)
RBC: 3.9 MIL/uL — ABNORMAL LOW (ref 4.22–5.81)
RDW: 15 % (ref 11.5–15.5)
WBC: 26.7 10*3/uL — ABNORMAL HIGH (ref 4.0–10.5)

## 2016-05-26 LAB — URINE MICROSCOPIC-ADD ON: SQUAMOUS EPITHELIAL / LPF: NONE SEEN

## 2016-05-26 LAB — RAPID URINE DRUG SCREEN, HOSP PERFORMED
AMPHETAMINES: NOT DETECTED
Barbiturates: NOT DETECTED
Benzodiazepines: NOT DETECTED
COCAINE: POSITIVE — AB
OPIATES: POSITIVE — AB
TETRAHYDROCANNABINOL: NOT DETECTED

## 2016-05-26 LAB — LACTIC ACID, PLASMA
LACTIC ACID, VENOUS: 1.4 mmol/L (ref 0.5–1.9)
Lactic Acid, Venous: 1.5 mmol/L (ref 0.5–1.9)

## 2016-05-26 LAB — CK
Total CK: 941 U/L — ABNORMAL HIGH (ref 49–397)
Total CK: 704 U/L — ABNORMAL HIGH (ref 49–397)

## 2016-05-26 LAB — CBG MONITORING, ED: Glucose-Capillary: 89 mg/dL (ref 65–99)

## 2016-05-26 LAB — MAGNESIUM: Magnesium: 1.9 mg/dL (ref 1.7–2.4)

## 2016-05-26 LAB — ACETAMINOPHEN LEVEL: Acetaminophen (Tylenol), Serum: <10 ug/mL — ABNORMAL LOW (ref 10–30)

## 2016-05-26 LAB — SALICYLATE LEVEL: Salicylate Lvl: <4 mg/dL (ref 2.8–30.0)

## 2016-05-26 LAB — PHOSPHORUS: Phosphorus: 4.3 mg/dL (ref 2.5–4.6)

## 2016-05-26 LAB — ETHANOL

## 2016-05-26 LAB — TROPONIN I: Troponin I: <0.03 ng/mL (ref <0.03)

## 2016-05-26 MED ORDER — SODIUM CHLORIDE 0.9 % IV SOLN
INTRAVENOUS | Status: AC
  Administered 2016-05-26: 23:00:00 via INTRAVENOUS

## 2016-05-26 MED ORDER — ACETAMINOPHEN 650 MG RE SUPP: 650.0000 mg | Freq: Four times a day (QID) | RECTAL | Status: DC | PRN

## 2016-05-26 MED ORDER — SODIUM CHLORIDE 0.9 % IV BOLUS (SEPSIS)
1000.0000 mL | Freq: Once | INTRAVENOUS | Status: AC
  Administered 2016-05-26: 1000 mL via INTRAVENOUS

## 2016-05-26 MED ORDER — ONDANSETRON HCL 4 MG PO TABS: 4.0000 mg | ORAL_TABLET | Freq: Four times a day (QID) | ORAL | Status: DC | PRN

## 2016-05-26 MED ORDER — ONDANSETRON HCL 4 MG/2ML IJ SOLN: 4.0000 mg | Freq: Four times a day (QID) | INTRAMUSCULAR | Status: DC | PRN

## 2016-05-26 MED ORDER — POTASSIUM CHLORIDE CRYS ER 20 MEQ PO TBCR
40.0000 meq | EXTENDED_RELEASE_TABLET | Freq: Once | ORAL | Status: AC
  Administered 2016-05-26: 40 meq via ORAL
  Filled 2016-05-26: qty 2

## 2016-05-26 MED ORDER — SODIUM CHLORIDE 0.9% FLUSH
3.0000 mL | Freq: Two times a day (BID) | INTRAVENOUS | Status: DC
Start: 2016-05-26 — End: 2016-06-04
  Administered 2016-05-29 – 2016-06-04 (×9): 3 mL via INTRAVENOUS

## 2016-05-26 MED ORDER — ACETAMINOPHEN 325 MG PO TABS: 650.0000 mg | ORAL_TABLET | Freq: Four times a day (QID) | ORAL | Status: DC | PRN

## 2016-05-26 MED ORDER — ENOXAPARIN SODIUM 30 MG/0.3ML ~~LOC~~ SOLN
30.0000 mg | SUBCUTANEOUS | Status: DC
Start: 2016-05-26 — End: 2016-05-27
  Filled 2016-05-26: qty 0.3

## 2016-05-26 MED ORDER — TRAMADOL HCL 50 MG PO TABS
100.0000 mg | ORAL_TABLET | Freq: Two times a day (BID) | ORAL | Status: DC | PRN
  Administered 2016-05-28 – 2016-05-29 (×3): 100 mg via ORAL
  Filled 2016-05-26 (×3): qty 2

## 2016-05-26 MED ORDER — VANCOMYCIN HCL 10 G IV SOLR
2000.0000 mg | Freq: Once | INTRAVENOUS | Status: AC
  Administered 2016-05-26: 2000 mg via INTRAVENOUS
  Filled 2016-05-26: qty 2000

## 2016-05-26 MED ORDER — OXYCODONE-ACETAMINOPHEN 7.5-325 MG PO TABS
1.0000 | ORAL_TABLET | ORAL | Status: DC | PRN
  Administered 2016-05-26 – 2016-05-31 (×15): 1 via ORAL
  Filled 2016-05-26 (×15): qty 1

## 2016-05-26 MED ORDER — PANTOPRAZOLE SODIUM 40 MG PO TBEC
40.0000 mg | DELAYED_RELEASE_TABLET | Freq: Every day | ORAL | Status: DC
  Administered 2016-05-27 – 2016-06-04 (×9): 40 mg via ORAL
  Filled 2016-05-26 (×9): qty 1

## 2016-05-26 MED ORDER — FAMOTIDINE 20 MG PO TABS: 10.0000 mg | ORAL_TABLET | Freq: Every day | ORAL | Status: DC

## 2016-05-26 MED ORDER — DEXTROSE 5 % IV SOLN
2.0000 g | INTRAVENOUS | Status: DC
  Administered 2016-05-26 – 2016-05-31 (×6): 2 g via INTRAVENOUS
  Filled 2016-05-26 (×8): qty 2

## 2016-05-26 MED ORDER — SODIUM CHLORIDE 0.9 % IV SOLN
INTRAVENOUS | Status: DC
  Administered 2016-05-26 – 2016-05-29 (×4): via INTRAVENOUS

## 2016-05-26 NOTE — Progress Notes (Signed)
PHARMACY NOTE -  Rocephin  Pharmacy has been assisting with dosing of Rocephin for UTI. Dosage remains stable at 2g q24 hr (for Wt > 100 kg) and need for further dosage adjustment appears unlikely at present.    Will sign off at this time.  Please reconsult if a change in clinical status warrants re-evaluation of dosage.  Reuel Boom, PharmD, BCPS Pager: [phone] 05/26/2016, 7:42 PM

## 2016-05-26 NOTE — Progress Notes (Signed)
EDCM spoke to patient at bedside. Patient with blanket over his head.  Patient with paraplegia. Patient confirms he lives alone.  He reports he is usually able to perform his ADL's on his own. Per chart review, patient was unable to get himself off of the floor for 5 days. [facility] asked patient if he has a life alert bracleet or necklace?  Patient responded "No" and refused information on this topic. Patient reports the only dme he has at home is his wheelchair.   He confirms his pcp is Dr. Alyson Ingles.   [facility] informed patient that he may be eligible for PCS services in his home through his pcp.  Patient will need follow up conversation in regards to home health services when patient is feeling better. No further EDCM needs at this time.
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
## 2016-05-26 NOTE — Progress Notes (Addendum)
Pharmacy Antibiotic Note  [name] is a 50 y.o. paraplegic male admitted on 05/26/2016 with UTI and decubitus ulcers as possible source of leukocytosis; no fevers. Patient spent 5 days on floor after falling out of wheelchair.  Pharmacy has been consulted for vancomycin dosing; Rocephin already started for UTI and signed off. Acute renal failure and elevated CK after prolonged time down. Second lactic acid mildly elevated but no fevers; WBC elevated.  Plan:  Vancomycin 2 g IV x 1  Expect SCr will improve with rehydration but may be prolonged recovery.  Recheck SCr tomorrow and schedule vancomycin doses or check random vanc level as appropriate.  Goal VT 10-15 for soft tissue infection     Temp (24hrs), Avg:97.4 F (36.3 C), Min:97.4 F (36.3 C), Max:97.4 F (36.3 C)   Recent Labs Lab 05/26/16 1801 05/26/16 1839  WBC 26.7*  --   CREATININE 5.00*  --   LATICACIDVEN 1.5 2.12*    CrCl cannot be calculated (Unknown ideal weight.).    Allergies  Allergen Reactions  . No Known Allergies     Antimicrobials this admission: Rocephin 8/25 >>  Vancomycin 8/25 >>   Dose adjustments this admission: ---  Microbiology results: 8/25 UCx: sent    Thank you for allowing pharmacy to be a part of this patient's care.  Reuel Boom, PharmD, BCPS Pager: [phone] 05/26/2016, 8:58 PM

## 2016-05-26 NOTE — ED Notes (Signed)
Bed: WA11 Expected date:  Expected time:  Means of arrival:  Comments: EMS-fall 
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
## 2016-05-26 NOTE — ED Triage Notes (Signed)
Patient presents to [facility]-ED after suffering a fall from his wheelchair 5 days ago. He lives alone and was unable to move himself from that position for 5 days. Today someone came to check on him and found him in the floor. He is covered in feces, urine, and EMS believes he has wounds. He is a paraplegic at baseline.

## 2016-05-27 LAB — COMPREHENSIVE METABOLIC PANEL
ALBUMIN: 2 g/dL — AB (ref 3.5–5.0)
ALK PHOS: 90 U/L (ref 38–126)
ALT: 26 U/L (ref 17–63)
ANION GAP: 13 (ref 5–15)
AST: 37 U/L (ref 15–41)
BILIRUBIN TOTAL: 0.7 mg/dL (ref 0.3–1.2)
BUN: 66 mg/dL — AB (ref 6–20)
CALCIUM: 8 mg/dL — AB (ref 8.9–10.3)
CO2: 22 mmol/L (ref 22–32)
CREATININE: 4.73 mg/dL — AB (ref 0.61–1.24)
Chloride: 105 mmol/L (ref 101–111)
GFR calc Af Amer: 15 mL/min — ABNORMAL LOW (ref >60)
GFR calc non Af Amer: 13 mL/min — ABNORMAL LOW (ref >60)
GLUCOSE: 134 mg/dL — AB (ref 65–99)
Potassium: 3.2 mmol/L — ABNORMAL LOW (ref 3.5–5.1)
Sodium: 140 mmol/L (ref 135–145)
TOTAL PROTEIN: 7.1 g/dL (ref 6.5–8.1)

## 2016-05-27 LAB — IRON AND TIBC
Iron: 34 ug/dL — ABNORMAL LOW (ref 45–182)
Saturation Ratios: 26 % (ref 17.9–39.5)
TIBC: 132 ug/dL — ABNORMAL LOW (ref 250–450)
UIBC: 98 ug/dL

## 2016-05-27 LAB — CBC
HCT: 28.4 % — ABNORMAL LOW (ref 39.0–52.0)
HEMOGLOBIN: 9.2 g/dL — AB (ref 13.0–17.0)
MCH: 26.8 pg (ref 26.0–34.0)
MCHC: 32.4 g/dL (ref 30.0–36.0)
MCV: 82.8 fL (ref 78.0–100.0)
PLATELETS: 414 10*3/uL — AB (ref 150–400)
RBC: 3.43 MIL/uL — ABNORMAL LOW (ref 4.22–5.81)
RDW: 14.9 % (ref 11.5–15.5)
WBC: 23.7 10*3/uL — ABNORMAL HIGH (ref 4.0–10.5)

## 2016-05-27 LAB — PHOSPHORUS: Phosphorus: 3.7 mg/dL (ref 2.5–4.6)

## 2016-05-27 LAB — FOLATE: FOLATE: 6 ng/mL (ref >5.9)

## 2016-05-27 LAB — RETICULOCYTES
RBC.: 3.43 MIL/uL — AB (ref 4.22–5.81)
RETIC COUNT ABSOLUTE: 41.2 10*3/uL (ref 19.0–186.0)
Retic Ct Pct: 1.2 % (ref 0.4–3.1)

## 2016-05-27 LAB — FERRITIN: Ferritin: 1137 ng/mL — ABNORMAL HIGH (ref 24–336)

## 2016-05-27 LAB — VITAMIN B12: VITAMIN B 12: 508 pg/mL (ref 180–914)

## 2016-05-27 LAB — TSH: TSH: 0.979 u[IU]/mL (ref 0.350–4.500)

## 2016-05-27 LAB — MAGNESIUM: MAGNESIUM: 1.8 mg/dL (ref 1.7–2.4)

## 2016-05-27 LAB — SODIUM, URINE, RANDOM: Sodium, Ur: 34 mmol/L

## 2016-05-27 LAB — CREATININE, URINE, RANDOM: Creatinine, Urine: 201 mg/dL

## 2016-05-27 LAB — CK: CK TOTAL: 561 U/L — AB (ref 49–397)

## 2016-05-27 MED ORDER — VANCOMYCIN HCL 10 G IV SOLR: 1750.0000 mg | INTRAVENOUS | Status: DC

## 2016-05-27 MED ORDER — ENOXAPARIN SODIUM 40 MG/0.4ML ~~LOC~~ SOLN
40.0000 mg | SUBCUTANEOUS | Status: DC
  Administered 2016-05-27: 40 mg via SUBCUTANEOUS
  Filled 2016-05-27: qty 0.4

## 2016-05-27 MED ORDER — MORPHINE SULFATE (PF) 2 MG/ML IV SOLN
1.0000 mg | Freq: Once | INTRAVENOUS | Status: AC
  Administered 2016-05-27: 1 mg via INTRAVENOUS
  Filled 2016-05-27: qty 1
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
## 2016-05-27 NOTE — Progress Notes (Signed)
Pharmacy Antibiotic Note  [name] is a 50 y.o. paraplegic male admitted on 05/26/2016 with UTI and decubitus ulcers as possible source of leukocytosis; no fevers. Patient spent 5 days on floor after falling out of wheelchair.  Pharmacy has been consulted for vancomycin dosing; Rocephin already started for UTI and signed off. Acute renal failure and elevated CK after prolonged time down. Second lactic acid mildly elevated but no fevers; WBC elevated.  Plan:  Scr improved some CrCl N now 19 - start vancomycin 1750mg  IV q48 to begin tomorrow 8/27  Goal VT 10-15 for soft tissue infection  Height: 6' (182.9 cm) Weight: 268 lb 12.8 oz (121.9 kg) IBW/kg (Calculated) : 77.6  Temp (24hrs), Avg:97.7 F (36.5 C), Min:97.4 F (36.3 C), Max:98 F (36.7 C)   Recent Labs Lab 05/26/16 1801 05/26/16 1839 05/26/16 2237 05/27/16 0511  WBC 26.7*  --   --  23.7*  CREATININE 5.00*  --   --  4.73*  LATICACIDVEN 1.5 2.12* 1.4  --     Estimated Creatinine Clearance: 24.6 mL/min (by C-G formula based on SCr of 4.73 mg/dL).    Allergies  Allergen Reactions  . No Known Allergies     Antimicrobials this admission: Rocephin 8/25 >>  Vancomycin 8/25 >>   Dose adjustments this admission: ---  Microbiology results: 8/25 UCx: sent    Thank you for allowing pharmacy to be a part of this patient's care.  Adrian Saran, PharmD, BCPS Pager [phone] 05/27/2016 10:20 AM

## 2016-05-28 LAB — BASIC METABOLIC PANEL
Anion gap: 7 (ref 5–15)
Anion gap: 8 (ref 5–15)
Anion gap: 9 (ref 5–15)
BUN: 56 mg/dL — ABNORMAL HIGH (ref 6–20)
BUN: 58 mg/dL — AB (ref 6–20)
BUN: 50 mg/dL — ABNORMAL HIGH (ref 6–20)
CALCIUM: 7.8 mg/dL — AB (ref 8.9–10.3)
CHLORIDE: 109 mmol/L (ref 101–111)
CHLORIDE: 112 mmol/L — AB (ref 101–111)
CO2: 21 mmol/L — ABNORMAL LOW (ref 22–32)
CO2: 23 mmol/L (ref 22–32)
CO2: 23 mmol/L (ref 22–32)
CREATININE: 2.58 mg/dL — AB (ref 0.61–1.24)
CREATININE: 3.23 mg/dL — AB (ref 0.61–1.24)
CREATININE: 3.52 mg/dL — AB (ref 0.61–1.24)
Calcium: 8 mg/dL — ABNORMAL LOW (ref 8.9–10.3)
Calcium: 8 mg/dL — ABNORMAL LOW (ref 8.9–10.3)
Chloride: 107 mmol/L (ref 101–111)
GFR calc Af Amer: 21 mL/min — ABNORMAL LOW (ref >60)
GFR, EST AFRICAN AMERICAN: 24 mL/min — AB (ref >60)
GFR, EST AFRICAN AMERICAN: 31 mL/min — AB (ref >60)
GFR, EST NON AFRICAN AMERICAN: 18 mL/min — AB (ref >60)
GFR, EST NON AFRICAN AMERICAN: 21 mL/min — AB (ref >60)
GFR, EST NON AFRICAN AMERICAN: 27 mL/min — AB (ref >60)
Glucose, Bld: 101 mg/dL — ABNORMAL HIGH (ref 65–99)
Glucose, Bld: 79 mg/dL (ref 65–99)
Glucose, Bld: 87 mg/dL (ref 65–99)
POTASSIUM: 3.2 mmol/L — AB (ref 3.5–5.1)
POTASSIUM: 3.3 mmol/L — AB (ref 3.5–5.1)
POTASSIUM: 3.3 mmol/L — AB (ref 3.5–5.1)
SODIUM: 139 mmol/L (ref 135–145)
SODIUM: 140 mmol/L (ref 135–145)
SODIUM: 140 mmol/L (ref 135–145)

## 2016-05-28 LAB — CBC
HCT: 27.2 % — ABNORMAL LOW (ref 39.0–52.0)
HEMOGLOBIN: 8.9 g/dL — AB (ref 13.0–17.0)
MCH: 27.1 pg (ref 26.0–34.0)
MCHC: 32.7 g/dL (ref 30.0–36.0)
MCV: 82.7 fL (ref 78.0–100.0)
PLATELETS: 430 10*3/uL — AB (ref 150–400)
RBC: 3.29 MIL/uL — AB (ref 4.22–5.81)
RDW: 15 % (ref 11.5–15.5)
WBC: 17.3 10*3/uL — ABNORMAL HIGH (ref 4.0–10.5)

## 2016-05-28 LAB — HEMOGLOBIN A1C
Hgb A1c MFr Bld: 5.5 % (ref 4.8–5.6)
MEAN PLASMA GLUCOSE: 111 mg/dL

## 2016-05-28 MED ORDER — ENOXAPARIN SODIUM 60 MG/0.6ML ~~LOC~~ SOLN
60.0000 mg | SUBCUTANEOUS | Status: DC
  Administered 2016-05-28 – 2016-05-30 (×3): 60 mg via SUBCUTANEOUS
  Filled 2016-05-28 (×3): qty 0.6

## 2016-05-28 MED ORDER — POTASSIUM CHLORIDE CRYS ER 20 MEQ PO TBCR
40.0000 meq | EXTENDED_RELEASE_TABLET | Freq: Once | ORAL | Status: AC
  Administered 2016-05-28: 40 meq via ORAL
  Filled 2016-05-28: qty 2

## 2016-05-28 MED ORDER — ENSURE ENLIVE PO LIQD
237.0000 mL | Freq: Two times a day (BID) | ORAL | Status: DC
  Administered 2016-05-29 – 2016-06-03 (×7): 237 mL via ORAL

## 2016-05-28 MED ORDER — COLLAGENASE 250 UNIT/GM EX OINT
TOPICAL_OINTMENT | Freq: Every day | CUTANEOUS | Status: DC
  Administered 2016-05-29: 10:00:00 via TOPICAL
  Filled 2016-05-28: qty 30

## 2016-05-28 MED ORDER — PRO-STAT SUGAR FREE PO LIQD
30.0000 mL | Freq: Two times a day (BID) | ORAL | Status: DC
  Administered 2016-05-28 – 2016-05-31 (×4): 30 mL via ORAL
  Filled 2016-05-28 (×4): qty 30

## 2016-05-28 NOTE — Consult Note (Signed)
[place] Nurse wound consult note Reason for Consult: Patient found down for several days, new and existing pressure injuries. Heels with previous pressure injuries, now healed and with dry, flaking skin in area of previous tissue injury. Moisture associated skin damage, patient found in urine and stool.  Hydrotherapy is ordered to begin on Monday, 05/29/16 in conjunction with enzymatic debriding agent, collagenase (Santyl) to sacral wound.  If desired, a surgical consult could also be initiated. If contamination of wound is a concern, consideration of a diverting colostomy may be required. If you agree input from [facility] would be helpful, please order consultation. Wound type:Pressure, Moisture Pressure Ulcer POA: Yes Measurement: Unstageable Pressure injury to the sacrum:  6cm x 5cm with current drop in depth of 4cm.  Undermining at 5 o'clock measures 5cm.  Wound bed is black necrotic tissue and suspected depth is greater than that seen today. Moderate amount of exudate with strong odor consistent with necrotic tissue. Partial thickness tissue loss with near reepithelialization located at 2 o'clock from sacral wound:  4cm x 5cm x 0.1cm.  Pink, dry wound bed. Partial thickness area of moisture associated skin damage located distal to sacral ulcer:  2.5cm x 2c, x 0.1cm with pink wound bed and white/grey overhydrated (macerated) tissue surrounding. Scant serous exudate. Right ischial tuberosity (Stage 3): 3cm x 3.5cm x 3cm with undermining from 9-3 o'clock measuring 4cm. Clean, red, wound bed with minimal serous exudate and no odor.  Linear full thickness wound adjacent to distal end of right ischial tuberosity pressure injury nmeasuring 1cm x 6cm x 0.2cm. Red, moist wound bed. Small amount of serous exudate, no odor. Wound bed:As described above. Drainage (amount, consistency, odor) Small to moderate amount of exudate with strong odor consistent with necrotic tissue. Periwound:intact, dry Dressing  procedure/placement/frequency: A therapeutic mattress with low air loss feature was provided upon admission to unit last evening.Turning and repositioning is in place, heels are floated, but I will provide pressure redistribution heel boots to maintain foot alignment and redistribute pressure. [facility] nursing team will not follow routinely, but will remain available to this patient, the nursing, rehab services and medical teams.  Please re-consult if needed. Thanks, Maudie Flakes, MSN, RN, [place], Arther Abbott  Pager# [phone]
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
## 2016-05-28 NOTE — Progress Notes (Signed)
Patient complain of bladder pressure, Pt stated he is been using In and out cath at home, Bladder scanned patient showed >559ml, M.D. Informed and ordered Foley Catheter, RN put the foley in and drained 555ml. PT stated he feels much better. Will continue to monitor.

## 2016-05-29 LAB — URINE CULTURE: Culture: >=100000 — AB

## 2016-05-29 LAB — CBC
HCT: 26.3 % — ABNORMAL LOW (ref 39.0–52.0)
Hemoglobin: 8.6 g/dL — ABNORMAL LOW (ref 13.0–17.0)
MCH: 27.3 pg (ref 26.0–34.0)
MCHC: 32.7 g/dL (ref 30.0–36.0)
MCV: 83.5 fL (ref 78.0–100.0)
PLATELETS: 440 10*3/uL — AB (ref 150–400)
RBC: 3.15 MIL/uL — AB (ref 4.22–5.81)
RDW: 15.5 % (ref 11.5–15.5)
WBC: 16.3 10*3/uL — AB (ref 4.0–10.5)

## 2016-05-29 LAB — BASIC METABOLIC PANEL
Anion gap: 7 (ref 5–15)
BUN: 44 mg/dL — ABNORMAL HIGH (ref 6–20)
CALCIUM: 7.8 mg/dL — AB (ref 8.9–10.3)
CO2: 21 mmol/L — ABNORMAL LOW (ref 22–32)
CREATININE: 1.99 mg/dL — AB (ref 0.61–1.24)
Chloride: 115 mmol/L — ABNORMAL HIGH (ref 101–111)
GFR, EST AFRICAN AMERICAN: 43 mL/min — AB (ref >60)
GFR, EST NON AFRICAN AMERICAN: 37 mL/min — AB (ref >60)
Glucose, Bld: 113 mg/dL — ABNORMAL HIGH (ref 65–99)
Potassium: 3.1 mmol/L — ABNORMAL LOW (ref 3.5–5.1)
SODIUM: 143 mmol/L (ref 135–145)

## 2016-05-29 LAB — FERRITIN: Ferritin: 589 ng/mL — ABNORMAL HIGH (ref 24–336)

## 2016-05-29 LAB — IRON AND TIBC
IRON: 21 ug/dL — AB (ref 45–182)
SATURATION RATIOS: 17 % — AB (ref 17.9–39.5)
TIBC: 125 ug/dL — ABNORMAL LOW (ref 250–450)
UIBC: 104 ug/dL

## 2016-05-29 MED ORDER — POTASSIUM CHLORIDE CRYS ER 20 MEQ PO TBCR
40.0000 meq | EXTENDED_RELEASE_TABLET | Freq: Once | ORAL | Status: AC
  Administered 2016-05-29: 40 meq via ORAL
  Filled 2016-05-29: qty 2

## 2016-05-29 MED ORDER — NEOMYCIN SULFATE 500 MG PO TABS
1000.0000 mg | ORAL_TABLET | ORAL | Status: AC
  Administered 2016-05-29 (×3): 1000 mg via ORAL
  Filled 2016-05-29 (×3): qty 2

## 2016-05-29 MED ORDER — GABAPENTIN 300 MG PO CAPS
300.0000 mg | ORAL_CAPSULE | ORAL | Status: AC
  Administered 2016-05-30: 300 mg via ORAL
  Filled 2016-05-29: qty 1

## 2016-05-29 MED ORDER — DEXTROSE 5 % IV SOLN
2.0000 g | INTRAVENOUS | Status: AC
  Administered 2016-05-30: 2 g via INTRAVENOUS

## 2016-05-29 MED ORDER — VANCOMYCIN HCL 10 G IV SOLR
1500.0000 mg | INTRAVENOUS | Status: DC
  Administered 2016-05-29 – 2016-05-30 (×2): 1500 mg via INTRAVENOUS
  Filled 2016-05-29 (×2): qty 1500

## 2016-05-29 MED ORDER — DOCUSATE SODIUM 100 MG PO CAPS
200.0000 mg | ORAL_CAPSULE | Freq: Two times a day (BID) | ORAL | Status: DC
  Administered 2016-05-29 – 2016-05-30 (×3): 200 mg via ORAL
  Filled 2016-05-29 (×3): qty 2

## 2016-05-29 MED ORDER — ACETAMINOPHEN 500 MG PO TABS
1000.0000 mg | ORAL_TABLET | ORAL | Status: AC
  Filled 2016-05-29: qty 2

## 2016-05-29 MED ORDER — DEXTROSE 5 % IV SOLN: 2.0000 g | INTRAVENOUS | Status: DC

## 2016-05-29 MED ORDER — POLYETHYLENE GLYCOL 3350 17 GM/SCOOP PO POWD
1.0000 | Freq: Once | ORAL | Status: AC
  Administered 2016-05-29: 255 g via ORAL
  Filled 2016-05-29: qty 255

## 2016-05-29 MED ORDER — BISACODYL 5 MG PO TBEC
20.0000 mg | DELAYED_RELEASE_TABLET | Freq: Once | ORAL | Status: AC
  Administered 2016-05-29: 20 mg via ORAL
  Filled 2016-05-29: qty 4

## 2016-05-29 MED ORDER — METRONIDAZOLE 500 MG PO TABS
1000.0000 mg | ORAL_TABLET | ORAL | Status: AC
  Administered 2016-05-29 (×3): 1000 mg via ORAL
  Filled 2016-05-29 (×3): qty 2

## 2016-05-29 MED ORDER — ALVIMOPAN 12 MG PO CAPS
12.0000 mg | ORAL_CAPSULE | Freq: Once | ORAL | Status: AC
  Administered 2016-05-30: 12 mg via ORAL
  Filled 2016-05-29: qty 1
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
## 2016-05-29 NOTE — Progress Notes (Signed)
Patient was complaining of bloating and discomfort. He stated he hadn't had a BM in days and he usually disimpacts himself 3 times a week and also takes colace at home. On call notified and new orders were given for disimpaction and colace. Patient wanted to disimpact himself this AM and stated that he felt much better once he was done.
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
## 2016-05-29 NOTE — Progress Notes (Addendum)
05/29/16 1200 Physical Therapy Hydrotherapy Evaluation  Subjective Assessment  Subjective I want to lie on my left side. Much of  speech is slurred. patient is lethargic.  Patient and Family Stated Goals agreed to wound care.  Date of Onset (per  Care Every where, Plastic surgery consulted and planned for I and D at [facility] on 8/19)  Prior Treatments palstic surgery consult  Evaluation and Treatment  Evaluation and Treatment Procedures Explained to Patient/Family Yes  Evaluation and Treatment Procedures agreed to  Wound / Incision (Open or Dehisced) 05/29/16 Other (Comment) Sacrum Medial large malodorous, necrotic sacral wound with bleeding from r lateral aspect. Also noted multiple  small open wounds at various sites on the buttocks., R hip wound has a dressing . BM contaminant in wound bed.  Date First Assessed/Time First Assessed: 05/29/16 1114   Wound Type: (c) Other (Comment)  Location: Sacrum  Location Orientation: Medial  Wound Description (Comments): large malodorous, necrotic sacral wound with bleeding from right lateral aspect. Also noted floating bone in bed which is attached.  Dressing Type ABD;Gauze (Comment);Moist to dry  Dressing Changed New  Dressing Status Clean;Dry;Intact  Dressing Change Frequency Twice a day  Site / Wound Assessment Bleeding;Brown;Friable  % Wound base Red or Granulating 0%  % Wound base Black 100%  % Wound base Other (Comment) (dark bone in base, removed a small piece of bone from the bed)  Peri-wound Assessment Excoriated;Induration;Maceration  Wound Length (cm) 7 cm  Wound Width (cm) 3 cm  Wound Depth (cm) 3 cm  Undermining (cm) (at 8:00=5 cm, 6:00=3 cm,2:00=2 cm, )  Margins Unattached edges (unapproximated)  Drainage Amount Moderate  Drainage Description Purulent;Other (Comment) (bleeding from right side of wound when PLS was initiated.)  Non-staged Wound Description Full thickness  Treatment Cleansed;Debridement (Selective);Hydrotherapy (Pulse  lavage);Packing (Saline gauze)  Hydrotherapy  Pulsed lavage therapy - wound location sacral  Pulsed Lavage with Suction (psi) 1000 psi  Pulsed Lavage with Suction - Normal Saline Used 1000 mL  Pulsed Lavage Tip Tip with splash shield  Selective Debridement  Selective Debridement - Location sacral  Selective Debridement - Tools Used Forceps;Scalpel  Selective Debridement - Tissue Removed bone,, necgrotic  Wound Therapy - Assess/Plan/Recommendations  Wound Therapy - Clinical Statement The patient  has a history of paraplegia(unknown time). He was very lethargic and and did not respond to many questions related to PLOF.  The patient  has a large malodorous sacral wound. He was admitted to the hospital after having been found on floor after about 5 days. Noted in [place] indicates he was evaluated by palstic surgery at [facility] and scheduled for I and D and closure on 05/20/16. Apparantly did not get there for surgery. The patient  will benefit from PT for wound care.  Wound Therapy - Functional Problem List paraplegia, lives alone  Factors Delaying/Impairing Wound Healing Altered sensation;Immobility;Tobacco use  Hydrotherapy Plan Debridement;Dressing change;Patient/family education;Pulsatile lavage with suction  Wound Therapy - Frequency 6X / week  Wound Therapy - Current Recommendations Case manager/social work;Surgery consult  Wound Therapy - Follow Up Recommendations Other (comment)  Wound Plan LTAC  Wound Therapy Goals - Improve the function of patient's integumentary system by progressing the wound(s) through the phases of wound healing by:  Decrease Necrotic Tissue to 50  Decrease Necrotic Tissue - Progress Goal set today  Increase Granulation Tissue to 50  Increase Granulation Tissue - Progress Goal set today  Improve Drainage Characteristics Min  Improve Drainage Characteristics - Progress Goal set  today  Patient/Family will be able to  reposition  self in bed   Patient/Family Instruction Goal - Progress Goal set today  Goals/treatment plan/discharge plan were made with and agreed upon by patient/family Yes  Time For Goal Achievement 2 weeks  Wound Therapy - Potential for Goals Apolonio Schneiders PT [phone]

## 2016-05-29 NOTE — Progress Notes (Signed)
Pharmacy Antibiotic Note  [name] is a 50 y.o. paraplegic male admitted on 05/26/2016 with UTI and decubitus ulcers as possible source of leukocytosis.  Patient spent 5 days on floor after falling out of wheelchair PTA. Patient was started on ceftriaxone and vancomycin on admission with vancomycin d/ced on 8/27.  To resume vancomycin back today for wound infection.  - afebrile. WBC down to 16.3 on 8/28. SCr trending down 1.99 (crcl~44 N)  Plan: - vancomycin 1500 mg IV q24h - ceftriaxone 2 gm IV q24h per MD - monitor renal function closely  _________________________  Height: 6' (182.9 cm) Weight: 268 lb 12.8 oz (121.9 kg) IBW/kg (Calculated) : 77.6  Temp (24hrs), Avg:98.7 F (37.1 C), Min:98 F (36.7 C), Max:99.4 F (37.4 C)   Recent Labs Lab 05/26/16 1801 05/26/16 1839 05/26/16 2237 05/27/16 0511 05/27/16 2335 05/28/16 0525 05/28/16 1742 05/29/16 0537  WBC 26.7*  --   --  23.7*  --  17.3*  --   --   CREATININE 5.00*  --   --  4.73* 3.52* 3.23* 2.58* 1.99*  LATICACIDVEN 1.5 2.12* 1.4  --   --   --   --   --     Estimated Creatinine Clearance: 58.5 mL/min (by C-G formula based on SCr of 1.99 mg/dL).    Allergies  Allergen Reactions  . No Known Allergies     Antimicrobials this admission: Rocephin 8/25 >>  Vancomycin 8/25 >> 8/27>> resumed 8/28>>   Dose adjustments this admission: ---   Microbiology results: 8/25 UCx: >100K ecoli 8/25 BCx x2:   Thank you for allowing pharmacy to be a part of this patient's care.  Lynelle Doctor 05/29/2016 8:14 AM

## 2016-05-29 NOTE — Progress Notes (Signed)
While bathing patient this morning, RN and NT found pills under patient's bottom and back. RN brought pills down to pharmacy to verify. Pharmacy looked them up and they were 30mg  oxycodone pills. This RN and Carolyn Stare, RN counted 8 pills and disposed of them in the sharps. Patient stated his friend brought them for him because we weren't giving him enough pain medicine. He then stated he hadn't taken any yet. RN made him aware of the policy. On call NP notified.

## 2016-05-30 ENCOUNTER — Inpatient Hospital Stay (HOSPITAL_COMMUNITY): Payer: Medicare Other | Admitting: Anesthesiology

## 2016-05-30 ENCOUNTER — Other Ambulatory Visit (HOSPITAL_COMMUNITY): Payer: Self-pay | Admitting: Radiology

## 2016-05-30 ENCOUNTER — Encounter (HOSPITAL_COMMUNITY): Payer: Self-pay | Admitting: Anesthesiology

## 2016-05-30 ENCOUNTER — Inpatient Hospital Stay (HOSPITAL_COMMUNITY): Payer: Medicare Other

## 2016-05-30 ENCOUNTER — Encounter (HOSPITAL_COMMUNITY)
Admission: EM | Disposition: A | Discharge status: Skilled Nursing Facility | Payer: Self-pay | Source: Home / Self Care | Attending: Internal Medicine

## 2016-05-30 HISTORY — PX: COLON RESECTION: SHX5231

## 2016-05-30 HISTORY — PX: INCISION AND DRAINAGE ABSCESS: SHX5864

## 2016-05-30 LAB — BASIC METABOLIC PANEL
Anion gap: 6 (ref 5–15)
BUN: 24 mg/dL — ABNORMAL HIGH (ref 6–20)
CO2: 21 mmol/L — AB (ref 22–32)
CREATININE: 1.01 mg/dL (ref 0.61–1.24)
Calcium: 7.5 mg/dL — ABNORMAL LOW (ref 8.9–10.3)
Chloride: 116 mmol/L — ABNORMAL HIGH (ref 101–111)
GFR calc Af Amer: >60 mL/min (ref >60)
GFR calc non Af Amer: >60 mL/min (ref >60)
GLUCOSE: 104 mg/dL — AB (ref 65–99)
Potassium: 2.7 mmol/L — CL (ref 3.5–5.1)
Sodium: 143 mmol/L (ref 135–145)

## 2016-05-30 LAB — CBC
HEMATOCRIT: 27.5 % — AB (ref 39.0–52.0)
Hemoglobin: 9.1 g/dL — ABNORMAL LOW (ref 13.0–17.0)
MCH: 27.1 pg (ref 26.0–34.0)
MCHC: 33.1 g/dL (ref 30.0–36.0)
MCV: 81.8 fL (ref 78.0–100.0)
PLATELETS: 404 10*3/uL — AB (ref 150–400)
RBC: 3.36 MIL/uL — ABNORMAL LOW (ref 4.22–5.81)
RDW: 15.2 % (ref 11.5–15.5)
WBC: 18.1 10*3/uL — AB (ref 4.0–10.5)

## 2016-05-30 LAB — SURGICAL PCR SCREEN
MRSA, PCR: NEGATIVE
STAPHYLOCOCCUS AUREUS: NEGATIVE

## 2016-05-30 LAB — MRSA PCR SCREENING: MRSA BY PCR: POSITIVE — AB

## 2016-05-30 LAB — POCT I-STAT 4, (NA,K, GLUC, HGB,HCT)
GLUCOSE: 102 mg/dL — AB (ref 65–99)
HCT: 33 % — ABNORMAL LOW (ref 39.0–52.0)
HEMOGLOBIN: 11.2 g/dL — AB (ref 13.0–17.0)
POTASSIUM: 3 mmol/L — AB (ref 3.5–5.1)
SODIUM: 149 mmol/L — AB (ref 135–145)

## 2016-05-30 LAB — MAGNESIUM: Magnesium: 1.1 mg/dL — ABNORMAL LOW (ref 1.7–2.4)

## 2016-05-30 SURGERY — INCISION AND DRAINAGE, ABSCESS
Anesthesia: General | Site: Buttocks

## 2016-05-30 MED ORDER — LACTATED RINGERS IR SOLN
Status: DC | PRN
  Administered 2016-05-30: 1000 mL

## 2016-05-30 MED ORDER — MIDAZOLAM HCL 5 MG/5ML IJ SOLN
INTRAMUSCULAR | Status: DC | PRN
  Administered 2016-05-30: 2 mg via INTRAVENOUS

## 2016-05-30 MED ORDER — FENTANYL CITRATE (PF) 100 MCG/2ML IJ SOLN
INTRAMUSCULAR | Status: DC | PRN
  Administered 2016-05-30 (×3): 50 ug via INTRAVENOUS
  Administered 2016-05-30: 100 ug via INTRAVENOUS

## 2016-05-30 MED ORDER — LIP MEDEX EX OINT
1.0000 "application " | TOPICAL_OINTMENT | Freq: Two times a day (BID) | CUTANEOUS | Status: DC
  Administered 2016-05-31 – 2016-06-04 (×10): 1 via TOPICAL
  Filled 2016-05-30: qty 7

## 2016-05-30 MED ORDER — PHENYLEPHRINE HCL 10 MG/ML IJ SOLN: INTRAMUSCULAR | Status: DC | PRN

## 2016-05-30 MED ORDER — MENTHOL 3 MG MT LOZG: 1.0000 | LOZENGE | OROMUCOSAL | Status: DC | PRN

## 2016-05-30 MED ORDER — CHLORHEXIDINE GLUCONATE CLOTH 2 % EX PADS
6.0000 | MEDICATED_PAD | Freq: Every day | CUTANEOUS | Status: AC
  Administered 2016-05-30 – 2016-06-03 (×5): 6 via TOPICAL

## 2016-05-30 MED ORDER — PHENOL 1.4 % MT LIQD: 2.0000 | OROMUCOSAL | Status: DC | PRN

## 2016-05-30 MED ORDER — PHENYLEPHRINE 40 MCG/ML (10ML) SYRINGE FOR IV PUSH (FOR BLOOD PRESSURE SUPPORT)
PREFILLED_SYRINGE | INTRAVENOUS | Status: DC | PRN
  Administered 2016-05-30 (×3): 80 ug via INTRAVENOUS

## 2016-05-30 MED ORDER — ALVIMOPAN 12 MG PO CAPS
12.0000 mg | ORAL_CAPSULE | Freq: Two times a day (BID) | ORAL | Status: DC
  Administered 2016-05-30 – 2016-06-01 (×3): 12 mg via ORAL
  Filled 2016-05-30 (×4): qty 1

## 2016-05-30 MED ORDER — ROCURONIUM BROMIDE 10 MG/ML (PF) SYRINGE
PREFILLED_SYRINGE | INTRAVENOUS | Status: AC
  Filled 2016-05-30: qty 10

## 2016-05-30 MED ORDER — BUPIVACAINE LIPOSOME 1.3 % IJ SUSP
INTRAMUSCULAR | Status: DC | PRN
  Administered 2016-05-30: 20 mL

## 2016-05-30 MED ORDER — ONDANSETRON HCL 4 MG/2ML IJ SOLN
INTRAMUSCULAR | Status: AC
  Filled 2016-05-30: qty 2

## 2016-05-30 MED ORDER — SUGAMMADEX SODIUM 200 MG/2ML IV SOLN
INTRAVENOUS | Status: AC
  Filled 2016-05-30: qty 2

## 2016-05-30 MED ORDER — IOPAMIDOL (ISOVUE-300) INJECTION 61%: 30.0000 mL | Freq: Once | INTRAVENOUS | Status: DC | PRN

## 2016-05-30 MED ORDER — TRAMADOL HCL 50 MG PO TABS
100.0000 mg | ORAL_TABLET | Freq: Four times a day (QID) | ORAL | Status: DC | PRN
  Administered 2016-05-31: 100 mg via ORAL
  Filled 2016-05-30: qty 2

## 2016-05-30 MED ORDER — VANCOMYCIN HCL IN DEXTROSE 1-5 GM/200ML-% IV SOLN
1000.0000 mg | Freq: Two times a day (BID) | INTRAVENOUS | Status: DC
  Administered 2016-05-30 – 2016-06-01 (×5): 1000 mg via INTRAVENOUS
  Filled 2016-05-30 (×6): qty 200

## 2016-05-30 MED ORDER — MUPIROCIN 2 % EX OINT
1.0000 "application " | TOPICAL_OINTMENT | Freq: Two times a day (BID) | CUTANEOUS | Status: AC
  Administered 2016-05-30 – 2016-06-03 (×10): 1 via NASAL
  Filled 2016-05-30 (×3): qty 22

## 2016-05-30 MED ORDER — POTASSIUM CHLORIDE 10 MEQ/100ML IV SOLN
10.0000 meq | INTRAVENOUS | Status: AC
  Administered 2016-05-30 (×4): 10 meq via INTRAVENOUS
  Filled 2016-05-30 (×4): qty 100

## 2016-05-30 MED ORDER — CEFOTETAN DISODIUM-DEXTROSE 2-2.08 GM-% IV SOLR
INTRAVENOUS | Status: AC
Start: 2016-05-30 — End: 2016-05-30
  Filled 2016-05-30: qty 50

## 2016-05-30 MED ORDER — PROPOFOL 10 MG/ML IV BOLUS
INTRAVENOUS | Status: AC
  Filled 2016-05-30: qty 20

## 2016-05-30 MED ORDER — ROCURONIUM BROMIDE 10 MG/ML (PF) SYRINGE
PREFILLED_SYRINGE | INTRAVENOUS | Status: DC | PRN
  Administered 2016-05-30: 10 mg via INTRAVENOUS
  Administered 2016-05-30: 40 mg via INTRAVENOUS
  Administered 2016-05-30: 10 mg via INTRAVENOUS

## 2016-05-30 MED ORDER — POLYETHYLENE GLYCOL 3350 17 G PO PACK: 17.0000 g | PACK | Freq: Two times a day (BID) | ORAL | Status: DC | PRN

## 2016-05-30 MED ORDER — LIDOCAINE 2% (20 MG/ML) 5 ML SYRINGE
INTRAMUSCULAR | Status: DC | PRN
Start: 2016-05-30 — End: 2016-05-30
  Administered 2016-05-30: 40 mg via INTRAVENOUS

## 2016-05-30 MED ORDER — VITAMIN C 500 MG PO TABS
500.0000 mg | ORAL_TABLET | Freq: Two times a day (BID) | ORAL | Status: DC
  Administered 2016-05-30 – 2016-06-04 (×9): 500 mg via ORAL
  Filled 2016-05-30 (×10): qty 1

## 2016-05-30 MED ORDER — SUGAMMADEX SODIUM 200 MG/2ML IV SOLN
INTRAVENOUS | Status: DC | PRN
  Administered 2016-05-30: 200 mg via INTRAVENOUS

## 2016-05-30 MED ORDER — PSYLLIUM 95 % PO PACK
1.0000 | PACK | Freq: Two times a day (BID) | ORAL | Status: DC
Start: 2016-05-30 — End: 2016-05-31
  Filled 2016-05-30 (×2): qty 1

## 2016-05-30 MED ORDER — BUPIVACAINE-EPINEPHRINE 0.25% -1:200000 IJ SOLN
INTRAMUSCULAR | Status: DC | PRN
  Administered 2016-05-30: 50 mL

## 2016-05-30 MED ORDER — FENTANYL CITRATE (PF) 250 MCG/5ML IJ SOLN
INTRAMUSCULAR | Status: AC
  Filled 2016-05-30: qty 5

## 2016-05-30 MED ORDER — 0.9 % SODIUM CHLORIDE (POUR BTL) OPTIME
TOPICAL | Status: DC | PRN
  Administered 2016-05-30: 2000 mL
  Administered 2016-05-30: 3000 mL

## 2016-05-30 MED ORDER — PROPOFOL 10 MG/ML IV BOLUS
INTRAVENOUS | Status: DC | PRN
  Administered 2016-05-30: 150 mg via INTRAVENOUS

## 2016-05-30 MED ORDER — MIDAZOLAM HCL 2 MG/2ML IJ SOLN
INTRAMUSCULAR | Status: AC
  Filled 2016-05-30: qty 2

## 2016-05-30 MED ORDER — DEXTROSE 5 % IV SOLN
1000.0000 mg | Freq: Four times a day (QID) | INTRAVENOUS | Status: DC | PRN
  Filled 2016-05-30: qty 10

## 2016-05-30 MED ORDER — HYDROMORPHONE HCL 1 MG/ML IJ SOLN
0.2500 mg | INTRAMUSCULAR | Status: DC | PRN
  Administered 2016-05-30 (×4): 0.5 mg via INTRAVENOUS

## 2016-05-30 MED ORDER — ALUM & MAG HYDROXIDE-SIMETH 200-200-20 MG/5ML PO SUSP
30.0000 mL | Freq: Four times a day (QID) | ORAL | Status: DC | PRN
  Administered 2016-06-03 – 2016-06-04 (×2): 30 mL via ORAL
  Filled 2016-05-30 (×2): qty 30

## 2016-05-30 MED ORDER — MAGIC MOUTHWASH
15.0000 mL | Freq: Four times a day (QID) | ORAL | Status: DC | PRN
  Filled 2016-05-30: qty 15

## 2016-05-30 MED ORDER — HYDROMORPHONE HCL 1 MG/ML IJ SOLN
INTRAMUSCULAR | Status: AC
  Administered 2016-05-30: 0.5 mg via INTRAVENOUS
  Filled 2016-05-30: qty 1

## 2016-05-30 MED ORDER — BUPIVACAINE-EPINEPHRINE 0.25% -1:200000 IJ SOLN
INTRAMUSCULAR | Status: AC
  Filled 2016-05-30: qty 1

## 2016-05-30 MED ORDER — BUPIVACAINE LIPOSOME 1.3 % IJ SUSP
20.0000 mL | Freq: Once | INTRAMUSCULAR | Status: DC
  Filled 2016-05-30: qty 20

## 2016-05-30 MED ORDER — COLLAGENASE 250 UNIT/GM EX OINT
TOPICAL_OINTMENT | Freq: Every day | CUTANEOUS | Status: DC
  Administered 2016-06-02 – 2016-06-04 (×3): via TOPICAL
  Filled 2016-05-30: qty 30

## 2016-05-30 MED ORDER — LACTATED RINGERS IV BOLUS (SEPSIS): 1000.0000 mL | Freq: Three times a day (TID) | INTRAVENOUS | Status: AC | PRN

## 2016-05-30 MED ORDER — LACTATED RINGERS IV SOLN
INTRAVENOUS | Status: DC
  Administered 2016-05-30 (×2): via INTRAVENOUS

## 2016-05-30 MED ORDER — ONDANSETRON HCL 4 MG/2ML IJ SOLN
INTRAMUSCULAR | Status: DC | PRN
  Administered 2016-05-30: 4 mg via INTRAVENOUS

## 2016-05-30 MED ORDER — POTASSIUM CHLORIDE CRYS ER 20 MEQ PO TBCR
40.0000 meq | EXTENDED_RELEASE_TABLET | Freq: Once | ORAL | Status: AC
  Administered 2016-05-30: 40 meq via ORAL
  Filled 2016-05-30: qty 2

## 2016-05-30 MED ORDER — SUCCINYLCHOLINE CHLORIDE 200 MG/10ML IV SOSY
PREFILLED_SYRINGE | INTRAVENOUS | Status: DC | PRN
  Administered 2016-05-30: 100 mg via INTRAVENOUS

## 2016-05-30 MED ORDER — PHENYLEPHRINE 40 MCG/ML (10ML) SYRINGE FOR IV PUSH (FOR BLOOD PRESSURE SUPPORT)
PREFILLED_SYRINGE | INTRAVENOUS | Status: AC
  Filled 2016-05-30: qty 10

## 2016-05-30 MED ORDER — LIDOCAINE 2% (20 MG/ML) 5 ML SYRINGE
INTRAMUSCULAR | Status: AC
  Filled 2016-05-30: qty 5

## 2016-05-30 MED ORDER — POTASSIUM CHLORIDE IN NACL 40-0.9 MEQ/L-% IV SOLN
INTRAVENOUS | Status: DC
Start: 2016-05-30 — End: 2016-06-04
  Administered 2016-05-30 – 2016-06-01 (×3): 75 mL/h via INTRAVENOUS
  Administered 2016-06-02 – 2016-06-03 (×2): 100 mL/h via INTRAVENOUS
  Filled 2016-05-30 (×9): qty 1000

## 2016-05-30 SURGICAL SUPPLY — 102 items
APPLIER CLIP 5 13 M/L LIGAMAX5 (MISCELLANEOUS)
APPLIER CLIP ROT 10 11.4 M/L (STAPLE)
BAG URO CATCHER STRL LF (MISCELLANEOUS) ×8 IMPLANT
BINDER ABDOMINAL 12 ML 46-62 (SOFTGOODS) ×4 IMPLANT
BLADE SURG 15 STRL LF DISP TIS (BLADE) ×2 IMPLANT
BLADE SURG 15 STRL SS (BLADE) ×2
BNDG GAUZE ELAST 4 BULKY (GAUZE/BANDAGES/DRESSINGS) ×4 IMPLANT
BRIEF STRETCH FOR OB PAD LRG (UNDERPADS AND DIAPERS) IMPLANT
CABLE HIGH FREQUENCY MONO STRZ (ELECTRODE) ×4 IMPLANT
CELLS DAT CNTRL 66122 CELL SVR (MISCELLANEOUS) IMPLANT
CHLORAPREP W/TINT 26ML (MISCELLANEOUS) ×8 IMPLANT
CLIP APPLIE 5 13 M/L LIGAMAX5 (MISCELLANEOUS) IMPLANT
CLIP APPLIE ROT 10 11.4 M/L (STAPLE) IMPLANT
COUNTER NEEDLE 20 DBL MAG RED (NEEDLE) IMPLANT
COVER MAYO STAND STRL (DRAPES) ×4 IMPLANT
COVER SURGICAL LIGHT HANDLE (MISCELLANEOUS) IMPLANT
DECANTER SPIKE VIAL GLASS SM (MISCELLANEOUS) IMPLANT
DRAIN CHANNEL 19F RND (DRAIN) IMPLANT
DRAPE INCISE 23X17 IOBAN STRL (DRAPES) ×2
DRAPE INCISE IOBAN 23X17 STRL (DRAPES) ×2 IMPLANT
DRAPE LAPAROSCOPIC ABDOMINAL (DRAPES) ×4 IMPLANT
DRAPE LAPAROTOMY T 102X78X121 (DRAPES) IMPLANT
DRAPE SURG IRRIG POUCH 19X23 (DRAPES) ×4 IMPLANT
DRSG OPSITE POSTOP 4X10 (GAUZE/BANDAGES/DRESSINGS) IMPLANT
DRSG OPSITE POSTOP 4X6 (GAUZE/BANDAGES/DRESSINGS) IMPLANT
DRSG OPSITE POSTOP 4X8 (GAUZE/BANDAGES/DRESSINGS) IMPLANT
DRSG PAD ABDOMINAL 8X10 ST (GAUZE/BANDAGES/DRESSINGS) ×4 IMPLANT
DRSG TEGADERM 2-3/8X2-3/4 SM (GAUZE/BANDAGES/DRESSINGS) ×4 IMPLANT
DRSG TEGADERM 4X4.75 (GAUZE/BANDAGES/DRESSINGS) IMPLANT
ELECT PENCIL ROCKER SW 15FT (MISCELLANEOUS) IMPLANT
ELECT REM PT RETURN 15FT ADLT (MISCELLANEOUS) ×4 IMPLANT
ELECT REM PT RETURN 9FT ADLT (ELECTROSURGICAL) ×4
ELECTRODE REM PT RTRN 9FT ADLT (ELECTROSURGICAL) ×2 IMPLANT
ENDOLOOP SUT PDS II  0 18 (SUTURE)
ENDOLOOP SUT PDS II 0 18 (SUTURE) IMPLANT
EVACUATOR SILICONE 100CC (DRAIN) IMPLANT
GAUZE SPONGE 2X2 8PLY STRL LF (GAUZE/BANDAGES/DRESSINGS) ×2 IMPLANT
GAUZE SPONGE 4X4 12PLY STRL (GAUZE/BANDAGES/DRESSINGS) IMPLANT
GAUZE SPONGE 4X4 16PLY XRAY LF (GAUZE/BANDAGES/DRESSINGS) ×4 IMPLANT
GLOVE ECLIPSE 8.0 STRL XLNG CF (GLOVE) ×8 IMPLANT
GLOVE INDICATOR 8.0 STRL GRN (GLOVE) ×8 IMPLANT
GOWN STRL REUS W/TWL XL LVL3 (GOWN DISPOSABLE) ×16 IMPLANT
HANDPIECE INTERPULSE COAX TIP (DISPOSABLE) ×2
IRRIG SUCT STRYKERFLOW 2 WTIP (MISCELLANEOUS) ×4
IRRIGATION SUCT STRKRFLW 2 WTP (MISCELLANEOUS) ×2 IMPLANT
KIT BASIN OR (CUSTOM PROCEDURE TRAY) ×4 IMPLANT
LEGGING LITHOTOMY PAIR STRL (DRAPES) IMPLANT
LUBRICANT JELLY K Y 4OZ (MISCELLANEOUS) IMPLANT
NEEDLE HYPO 22GX1.5 SAFETY (NEEDLE) ×4 IMPLANT
PACK BASIC VI WITH GOWN DISP (CUSTOM PROCEDURE TRAY) IMPLANT
PACK COLON (CUSTOM PROCEDURE TRAY) IMPLANT
PAD ABD 8X10 STRL (GAUZE/BANDAGES/DRESSINGS) ×4 IMPLANT
PAD POSITIONING PINK XL (MISCELLANEOUS) ×4 IMPLANT
PORT LAP GEL ALEXIS MED 5-9CM (MISCELLANEOUS) IMPLANT
POSITIONER SURGICAL ARM (MISCELLANEOUS) ×8 IMPLANT
POUCH DRAINABLE 1PC 2 1/4 FLAT (OSTOMY) ×4 IMPLANT
RELOAD STAPLER BLUE 60MM (STAPLE) ×4 IMPLANT
RTRCTR WOUND ALEXIS 18CM MED (MISCELLANEOUS)
SCISSORS LAP 5X35 DISP (ENDOMECHANICALS) ×4 IMPLANT
SEALER TISSUE G2 STRG ARTC 35C (ENDOMECHANICALS) ×4 IMPLANT
SET HNDPC FAN SPRY TIP SCT (DISPOSABLE) ×2 IMPLANT
SLEEVE ADV FIXATION 5X100MM (TROCAR) IMPLANT
SLEEVE XCEL OPT CAN 5 100 (ENDOMECHANICALS) ×8 IMPLANT
SPONGE GAUZE 2X2 STER 10/PKG (GAUZE/BANDAGES/DRESSINGS) ×2
SPONGE LAP 18X18 X RAY DECT (DISPOSABLE) ×4 IMPLANT
STAPLE ECHEON FLEX 60 POW ENDO (STAPLE) ×4 IMPLANT
STAPLER RELOAD BLUE 60MM (STAPLE) ×8
STAPLER VISISTAT 35W (STAPLE) IMPLANT
SUT CHROMIC 2 0 SH (SUTURE) IMPLANT
SUT CHROMIC 3 0 SH 27 (SUTURE) IMPLANT
SUT MNCRL AB 4-0 PS2 18 (SUTURE) ×4 IMPLANT
SUT PDS AB 1 CTX 36 (SUTURE) IMPLANT
SUT PDS AB 1 TP1 96 (SUTURE) IMPLANT
SUT PROLENE 0 CT 2 (SUTURE) IMPLANT
SUT PROLENE 2 0 SH DA (SUTURE) IMPLANT
SUT SILK 2 0 (SUTURE) ×2
SUT SILK 2 0 SH CR/8 (SUTURE) ×4 IMPLANT
SUT SILK 2-0 18XBRD TIE 12 (SUTURE) ×2 IMPLANT
SUT SILK 3 0 (SUTURE) ×2
SUT SILK 3 0 SH CR/8 (SUTURE) ×4 IMPLANT
SUT SILK 3-0 18XBRD TIE 12 (SUTURE) ×2 IMPLANT
SUT VIC AB 2-0 SH 18 (SUTURE) ×8 IMPLANT
SUT VICRYL 0 UR6 27IN ABS (SUTURE) ×4 IMPLANT
SWAB COLLECTION DEVICE MRSA (MISCELLANEOUS) IMPLANT
SWAB CULTURE ESWAB REG 1ML (MISCELLANEOUS) IMPLANT
SYR 20CC LL (SYRINGE) IMPLANT
SYR BULB IRRIGATION 50ML (SYRINGE) IMPLANT
SYS LAPSCP GELPORT 120MM (MISCELLANEOUS)
SYSTEM LAPSCP GELPORT 120MM (MISCELLANEOUS) IMPLANT
TAPE UMBILICAL COTTON 1/8X30 (MISCELLANEOUS) ×4 IMPLANT
TOWEL OR 17X26 10 PK STRL BLUE (TOWEL DISPOSABLE) ×4 IMPLANT
TOWEL OR NON WOVEN STRL DISP B (DISPOSABLE) ×4 IMPLANT
TRAY FOLEY W/METER SILVER 14FR (SET/KITS/TRAYS/PACK) IMPLANT
TRAY FOLEY W/METER SILVER 16FR (SET/KITS/TRAYS/PACK) IMPLANT
TROCAR ADV FIXATION 5X100MM (TROCAR) IMPLANT
TROCAR BLADELESS OPT 5 100 (ENDOMECHANICALS) ×4 IMPLANT
TROCAR XCEL 12X100 BLDLESS (ENDOMECHANICALS) ×4 IMPLANT
TROCAR XCEL NON-BLD 11X100MML (ENDOMECHANICALS) IMPLANT
TUBING CONNECTING 10 (TUBING) IMPLANT
TUBING CONNECTING 10' (TUBING)
TUBING INSUF HEATED (TUBING) ×4 IMPLANT
YANKAUER SUCT BULB TIP 10FT TU (MISCELLANEOUS) ×4 IMPLANT

## 2016-05-30 NOTE — Anesthesia Postprocedure Evaluation (Signed)
Anesthesia Post Note  Patient: Abir Librarian, academic  Procedure(s) Performed: Procedure(s) (LRB): INCISION AND DRAINAGE DECUBITUS ULCER (N/A) LAPAROSCOPIC DIVERTING COLOSTOMY (N/A)  Patient location during evaluation: PACU Anesthesia Type: General Level of consciousness: awake and alert Pain management: pain level controlled Vital Signs Assessment: post-procedure vital signs reviewed and stable Respiratory status: spontaneous breathing, nonlabored ventilation, respiratory function stable and patient connected to nasal cannula oxygen Cardiovascular status: blood pressure returned to baseline and stable Postop Assessment: no signs of nausea or vomiting Anesthetic complications: no    Last Vitals:  Vitals:   05/30/16 1955 05/30/16 2000  BP: (!) 130/108 (!) 159/89  Pulse: 71   Resp: 12   Temp: 36.7 C     Last Pain:  Vitals:   05/30/16 1945  TempSrc:   PainSc: Asleep                 Marnae Madani J

## 2016-05-30 NOTE — Op Note (Addendum)
05/30/2016  6:33 PM  PATIENT:  [name]  50 y.o. male  Patient Care Team: Ricke Hey, MD as PCP - General (Family Medicine) Lennie Odor, MD as Consulting Physician (Plastic Surgery) Christin Fudge, MD as Consulting Physician (Surgery)  PRE-OPERATIVE DIAGNOSIS:    Sacral & Ischial decubitus ulcers with gangrene  POST-OPERATIVE DIAGNOSIS:  Sacral & Ischial decubitus ulcers with gangrene Osteomyelitis of sacrum  PROCEDURE:   FECAL DISIMPACTION Debridement of sacral decubitus ulcer including bone, periosteum, muscle, fat, skin.  LAPAROSCOPIC DIVERTING END COLOSTOMY  SURGEON:  Surgeon(s): Michael Boston, MD  ASSISTANT: rn   ANESTHESIA:   local and general  EBL:  Total I/O In: 1251.3 [I.V.:1251.3] Out: 1125 [Urine:1025; Blood:100]  Delay start of Pharmacological VTE agent (>24hrs) due to surgical blood loss or risk of bleeding:  no  DRAINS: none   SPECIMEN:  Source of Specimen:  Sacrum necrotic bone fragments  DISPOSITION OF SPECIMEN:  PATHOLOGY  COUNTS:  YES  PLAN OF CARE: Admit to inpatient   PATIENT DISPOSITION:  PACU - hemodynamically stable.  INDICATION:    Paraplegic male bedridden with chronic sacral and perineal, and ischiall tuberosity ulcers for many years. Self disimpacted due to constipation.. Found with failure to thrive with worsening chronic sacral ulcers. I recommended debridement. Probable osteomyelitis. I recommended fecal diversion with colostomy. Discussed with the patient numerous times. Technique was benefits and alternatives discussed. Possible need for debridement discussed. Management of colostomy discussed. Questions answered and agreed proceed.  The anatomy & physiology of the digestive tract was discussed.  The pathophysiology was discussed.  Natural history risks without surgery was discussed.   I worked to give an overview of the disease and the frequent need to have multispecialty involvement.  I feel the risks of no  intervention will lead to serious problems that outweigh the operative risks.  Risks such as bleeding, infection, abscess, leak, reoperation, hernia, heart attack, death, and other risks were discussed.  I noted a good likelihood this will help address the problem.   Goals of post-operative recovery were discussed as well.  We will work to minimize complications.  Questions were answered.    The patient expressed understanding & wished to proceed with surgery.  OR FINDINGS:   Patient had thickened redundant left-sided colon. It is a distal sigmoid colon and colostomy in the left upper quadrant.  Patient was impacted with a softball size mass of stool in the atonic rectum. Patient had marginal sphincter tone. Since his disimpacted, released a large volume of liquid stool consistent with his bowel prep.  Right issue tuberostomy wound chronic mostly viable. 6 x 4 cm. Tracks about 15 cm deep where the right hip joint can be felt. Some necrotic fat at the joint. Debrided off. Left anterior 3x3cm perineal wound tracts 9cm deep to to the left ischial tuberosity. Some chronic fibrotic necrotic edges of the wound debrided back and repacked.  Patient had a smaller necrotic right lateral perianal region. Resulting wound 3 x 2 x 2 cm deep    Patient with significant necrosis of skin and subcutaneous fat pelvic floor muscle and distal third of sacral bone and periosteum. Deep broad sacral decubitus wound with undermining.. Debrided back.  Sacral bone fragment sent for pathology.  Resulting sacral wound 10 x 10 cm wide. A 15 x 12 cm at base therefore undermining. Sacrum massively exposed.     DESCRIPTION:   Informed consent was confirmed.  The patient underwent general anaesthesia without difficulty.  The patient was positioned  appropriately.  VTE prevention in place.  The patient's abdomen was clipped, prepped, & draped in a sterile fashion.  Surgical timeout confirmed our plan.  After general anesthesia  we were able to disimpact a large stool ball about size of a softball out of the rectum.  Patient had massive diarrhea in the room and cleaned out his bowels.  Patient reprepped and draped  The patient was positioned in reverse Trendelenburg.  Abdominal entry was gained using optical entry technique in the right upper abdomen.  Entry was clean.  I induced carbon dioxide insufflation.  Camera inspection revealed no injury.  Extra ports were carefully placed under direct laparoscopic visualization.  The patient had thickened redundant sigmoid colon. Had some adhesions to the left pelvic brim. Rather thickened. I do not think a loop colostomy would go well. I created a window in the mesentery at the rectosigmoid junction and transected across it with 2 firings of a laparoscopic blue load stapler. 60 mm long. Transected the rectosigmoid mesentery radially to help straighten out:. With that the distal sigmoid could reach up to the left upper quadrant premarked area.   I excised a 3 cm disc of skin electrocautery at the premarked area in the left paramedian region. Cordata cylinder of subcutaneous fat. Split the anterior rectus fascia transversely. The rectus and posterior fascia vertically. Brought up the and of the sigmoid colon. Exteriorized 7 cm of healthy viable colon tissue. I secured it at the fascia with 2-0 silk interrupted suture 4.    Laparoscopic inspection. Hemostasis good. There is no torsion of the mesentery. Having regular carbon dioxide ports. Skin at the port sites closed with Monocryl and sterile dressing applied. Colostomy matured using 2-0 Vicryl pops in a Brooke fashion. 3 cm high smooth rosebud with good finger in the patient. Snug at the fascia but not overly loose nor tight. Colostomy appliance placed.  Patient was re-proceed repositioned and left side down decubitus fashion. Perineum and perirectal region prepped and draped in sterile fashion. Surgical timeout for plan.  I  investigated the right anterior issue tuberostomy wound. It had chronic granulation. Debrided some undermining skin flaps to have a more open wound. Following it to the bony pelvis could see some necrotic fat worried breather off with cautery. Ensured hemostasis. In the left anterior perineum at the base of the scrotum was a 3 x 3 cm wound with chronic for lupus like scarring. This tracked to the left ischial tuberosity. Debrided the chronically fibrotic and sclerotic skin to more healthier tissue around. Investigated wound and saw no ischemia there. Repacked. In the right lateral perianal region there is a patch of necrotic skin and fat that I debrided with the resulting wound as noted above.  I focused on the sacral decubitus. Patient had frank gangrene and necrotic fat and periosteum. I could feel that the coccyx & sacrum was fractured into many small granular fragments.  Could not really tell what was coccyx versus sacral tip.  Proceeded to excise all the necrotic fat and muscle circumferentially around flaps until I got to healthier tissue. This was sometimes just a few millimeters and sometimes 2 cm deep to get to that. This took some time. I took a bone rongeur to excise the necrotic bone fragments to expose some purulent periosteum. Freed that off. We excised around circumferentially. Ensured hemostasis. Pulsatile volume-several liters. More debridement. Ensured hemostasis. There is no evidence of necrotizing fasciitis. Resulting wound as noted above.  Wounds packed with rolled Kerlix gauze soaked  in chlorhexidine. Patient being extubated. There is no family available. We will continue to follow. Patient most likely needs assisted living or skilled facility to help manage these wounds as he has had significant decline at home. He will need colostomy care.  That should be a permanent colostomy given the hormonal state of his wounds and contamination and impaction and probable sphincter incontinence.   May  need re-debridement of his sacrum but probably needs to be plugged in and followed with wound care center for regular management to see how much of this can heal back down our at least avoid potential sepsis.  Adin Hector, M.D., F.A.C.S. Gastrointestinal and Minimally Invasive Surgery [facility] [address], [place],  [address] [phone] Main / Paging

## 2016-05-30 NOTE — Progress Notes (Signed)
Phlebotomist came back to draw blood at 0630. Pt. Refused again and told lab to "come back later."

## 2016-05-30 NOTE — Progress Notes (Signed)
Pharmacy Antibiotic Note  [name] is a 50 y.o. paraplegic male admitted on 05/26/2016 with UTI and decubitus ulcers as possible source of leukocytosis.  Patient spent 5 days on floor after falling out of wheelchair PTA. Patient was started on ceftriaxone and vancomycin on admission with vancomycin d/ced on 8/27.  Vancomycin resumed back on 8/28 for wound infection. Plan for surgical debridement for necrotic & infected sacral decubiti and possible colostomy on 8/29.  - afebrile. WBC up. SCr trending down 1.01 (crcl~87 N)  Plan: - With improving renal function, will change vancomycin to 1000 mg IV q12h - ceftriaxone 2 gm IV q24h per MD - monitor renal function closely  _________________________  Height: 6' (182.9 cm) Weight: 268 lb 12.8 oz (121.9 kg) IBW/kg (Calculated) : 77.6  Temp (24hrs), Avg:98.3 F (36.8 C), Min:98.3 F (36.8 C), Max:98.3 F (36.8 C)   Recent Labs Lab 05/26/16 1801 05/26/16 1839 05/26/16 2237 05/27/16 0511 05/27/16 2335 05/28/16 0525 05/28/16 1742 05/29/16 0530 05/29/16 0537 05/30/16 0842  WBC 26.7*  --   --  23.7*  --  17.3*  --  16.3*  --  18.1*  CREATININE 5.00*  --   --  4.73* 3.52* 3.23* 2.58*  --  1.99* 1.01  LATICACIDVEN 1.5 2.12* 1.4  --   --   --   --   --   --   --     Estimated Creatinine Clearance: 115.3 mL/min (by C-G formula based on SCr of 1.01 mg/dL).    Allergies  Allergen Reactions  . No Known Allergies     Antimicrobials this admission: Rocephin 8/25 >>  Vancomycin 8/25 >> 8/27>> resumed 8/28>>   Dose adjustments this admission: ---   Microbiology results: 8/25 UCx: >100K ecoli 8/25 BCx x2:   Thank you for allowing pharmacy to be a part of this patient's care.  Lynelle Doctor 05/30/2016 10:15 AM

## 2016-05-30 NOTE — Progress Notes (Signed)
PT Cancellation Note  Patient Details Name: [name] MRN: [id number] DOB: 11-25-1965   Cancelled Treatment:    Reason Eval/Treat Not Completed: Other (comment) (planned surgery today for I and D)please reorder hydro if indicated post op. Thank you   Claretha Cooper 05/30/2016, 7:19 AM Tresa Endo PT [phone]

## 2016-05-30 NOTE — Consult Note (Signed)
[facility] Nurse ostomy consult note [place] Nurse consult requested for preoperative stoma site selection and marking by Dr. Johney Maine and Modena Jansky, CCS PA.  Attempted to discuss surgical procedure and stoma creation with patient, but he is unable to concentrate at this time. Known to me from initial wound consult on Sunday, 05/28/16. Taught patient that should he receive an ostomy today, that I would be one of the nurses teaching him how to manage his ostomy. He nods understanding. Educational booklet let in room.  Examined patient lying and sitting in a high Fowler's position in bed in order to place the marking in the patient's visual field, away from any creases or abdominal contour issues and within the rectus muscle.  [name] is requested above the patient's umbilicus due to abdominal girth and panus.   Marked for colostomy in the LUQ, 6.5cm to the left of the umbilicus and [id number] above  Patient's abdomen cleansed with CHG wipes at site markings, allowed to air dry prior to marking.Covered mark with thin film transparent dressing to preserve mark until time of surgery.  [facility] Nurse team will follow up with patient after surgery for continue ostomy care and teaching.  Thanks, [name] Flakes, MSN, RN, [place], [name]  Pager# [phone]

## 2016-05-30 NOTE — Progress Notes (Addendum)
K+ 2.7, PA on floor and changed IVF to add KCl. K+ runs also ordered. Will follow up with 1800 BMP. Callie Fielding RN

## 2016-05-30 NOTE — Transfer of Care (Signed)
Immediate Anesthesia Transfer of Care Note  Patient: [name]  Procedure(s) Performed: Procedure(s): INCISION AND DRAINAGE DECUBITUS ULCER (N/A) LAPAROSCOPIC DIVERTING COLOSTOMY (N/A)  Patient Location: PACU  Anesthesia Type:General  Level of Consciousness:  sedated, patient cooperative and responds to stimulation  Airway & Oxygen Therapy:Patient Spontanous Breathing and Patient connected to face mask oxgen  Post-op Assessment:  Report given to PACU RN and Post -op Vital signs reviewed and stable  Post vital signs:  Reviewed and stable  Last Vitals:  Vitals:   05/29/16 0405 05/30/16 0633  BP: 127/83 130/65  Pulse: 78 86  Resp: 18 18  Temp: 37 C [id number] C    Complications: No apparent anesthesia complications

## 2016-05-30 NOTE — Anesthesia Preprocedure Evaluation (Addendum)
Anesthesia Evaluation  Patient identified by MRN, date of birth, ID band Patient awake    Reviewed: Allergy & Precautions, NPO status , Patient's Chart, lab work & pertinent test results  Airway Mallampati: II  TM Distance: >3 FB Neck ROM: Full    Dental no notable dental hx.    Pulmonary neg pulmonary ROS, Current Smoker,    Pulmonary exam normal breath sounds clear to auscultation       Cardiovascular hypertension, Pt. on medications Normal cardiovascular exam Rhythm:Regular Rate:Normal     Neuro/Psych Paraplegia.  Neuromuscular disease negative neurological ROS  negative psych ROS   GI/Hepatic Neg liver ROS, GERD  ,  Endo/Other    Renal/GU Renal Insufficiency and ARFRenal diseaseK 2.7 ; has received runs of KCl this afternoon.  negative genitourinary   Musculoskeletal negative musculoskeletal ROS (+)   Abdominal (+) + obese,   Peds negative pediatric ROS (+)  Hematology  (+) anemia , Hgb 9.1    Anesthesia Other Findings   Reproductive/Obstetrics negative OB ROS                            Anesthesia Physical Anesthesia Plan  ASA: III  Anesthesia Plan: General   Post-op Pain Management:    Induction: Intravenous  Airway Management Planned: Oral ETT  Additional Equipment:   Intra-op Plan:   Post-operative Plan: Extubation in OR  Informed Consent: I have reviewed the patients History and Physical, chart, labs and discussed the procedure including the risks, benefits and alternatives for the proposed anesthesia with the patient or authorized representative who has indicated his/her understanding and acceptance.   Dental advisory given  Plan Discussed with: CRNA  Anesthesia Plan Comments:         Anesthesia Quick Evaluation

## 2016-05-30 NOTE — Anesthesia Procedure Notes (Signed)
Procedure Name: Intubation Date/Time: 05/30/2016 3:24 PM Performed by: Lajuana Carry E Pre-anesthesia Checklist: Patient identified, Emergency Drugs available, Suction available and Patient being monitored Patient Re-evaluated:Patient Re-evaluated prior to inductionOxygen Delivery Method: Circle system utilized Preoxygenation: Pre-oxygenation with 100% oxygen Intubation Type: IV induction Ventilation: Mask ventilation without difficulty Laryngoscope Size: Miller and 2 Grade View: Grade II Tube type: Oral Tube size: 7.5 mm Number of attempts: 1 Airway Equipment and Method: Stylet Placement Confirmation: ETT inserted through vocal cords under direct vision,  positive ETCO2 and breath sounds checked- equal and bilateral Secured at: 22 cm Tube secured with: Tape Dental Injury: Teeth and Oropharynx as per pre-operative assessment

## 2016-05-30 NOTE — Progress Notes (Signed)
Day of Surgery  Subjective: Complaining of abdominal pain, needs to have BM, had bowel prep last PM.  He does not sound like he is certain about diverting colostomy this AM.  I   Objective: Vital signs in last 24 hours: Temp:  [98.3 F (36.8 C)] 98.3 F (36.8 C) (08/29 [id number]) Pulse Rate:  [86] 86 (08/29 0633) Resp:  [18] 18 (08/29 0633) BP: (130)/(65) 130/65 (08/29 0633) SpO2:  [100 %] 100 % (08/29 [id number]) Last BM Date: 05/29/16 IV 5.7 liters Urine:  5.2 liters Multiple stools Afebrile, VSS K+ 3.1 yesterday; creatinine 1.99 today K+ 2.7, creatinine is back down to normal WBC up this AM 18.1 MRI still pending   Intake/Output from previous day: 08/28 0701 - 08/29 0700 In: [id number] [I.V.:5225; IV Piggyback:550] Out: [id number] [Urine:5250] Intake/Output this shift: No intake/output data recorded.  General appearance: alert, cooperative and mild distress GI: complaining of abdominal pain.  large abd, does not appear distended, + Bs, not tender on palpation  Lab Results:   Recent Labs  05/29/16 0530 05/30/16 0842  WBC 16.3* 18.1*  HGB 8.6* 9.1*  HCT 26.3* 27.5*  PLT 440* 404*    BMET  Recent Labs  05/28/16 1742 05/29/16 0537  NA 140 143  K 3.2* 3.1*  CL 112* 115*  CO2 21* 21*  GLUCOSE 101* 113*  BUN 50* 44*  CREATININE 2.58* 1.99*  CALCIUM 8.0* 7.8*   PT/INR No results for input(s): LABPROT, INR in the last 72 hours.   Recent Labs Lab 05/26/16 1801 05/27/16 0511  AST 45* 37  ALT 34 26  ALKPHOS 105 90  BILITOT 1.1 0.7  PROT 8.9* 7.1  ALBUMIN 2.6* 2.0*     Lipase  No results found for: LIPASE   Studies/Results: No results found.  Medications: . acetaminophen  1,000 mg Oral On Call to OR  . cefoTEtan (CEFOTAN) 2 GM IVPB  2 g Intravenous On Call to OR  . cefTRIAXone (ROCEPHIN)  IV  2 g Intravenous Q24H  . Chlorhexidine Gluconate Cloth  6 each Topical Q0600  . collagenase   Topical Daily  . docusate sodium  200 mg Oral BID  . enoxaparin (LOVENOX)  injection  60 mg Subcutaneous Q24H  . feeding supplement (ENSURE ENLIVE)  237 mL Oral BID BM  . feeding supplement (PRO-STAT SUGAR FREE 64)  30 mL Oral BID WC  . mupirocin ointment  1 application Nasal BID  . pantoprazole  40 mg Oral Daily  . sodium chloride flush  3 mL Intravenous Q12H  . vancomycin  1,500 mg Intravenous Q24H   . sodium chloride 150 mL/hr at 05/29/16 2039   Assessment/Plan Sacral decubitus and right ischial wound Fall from chair; on floor x 5 days Acute kidney injury - improving Hypokalemia - Adding K+ to IV, recheck at 1800 hours today and in AM.  Mag pending Paraplegia since 1981 Chronic pain with prescribed and unprescribed oxycodone for pain relief Tobacco use Cocaine use FEN:  NPO/IV fluids ID:  Day 5 Rocephin Flagyl/Neomycin bowel prep, Vancomycin IV day 2 DVT:  Lovenox   PLan:  Replace K+, Dr. Johney Maine will discuss plans again.  If he does not want the colostomy, will just do debridement of the sites.        LOS: 4 days    Magdalena Skilton 05/30/2016 [phone]

## 2016-05-30 NOTE — Care Management Important Message (Signed)
Important Message  Patient Details IM Letter given Cookie/Case Manager to present to Patient Name: [name] MRN: [id number] Date of Birth: 04-Feb-1966   Medicare Important Message Given:  Yes    Camillo Flaming 05/30/2016, 10:05 AMImportant Message  Patient Details  Name: [name] MRN: [id number] Date of Birth: 05-13-1966   Medicare Important Message Given:  Yes    Camillo Flaming 05/30/2016, 10:05 AM

## 2016-05-30 NOTE — Progress Notes (Signed)
Pt. MRSA PCR resulted positive. On call NP made aware. MRSA order set initiated by RN. Patient made aware and education given.

## 2016-05-30 NOTE — Progress Notes (Addendum)
Pt. Refused phlebotomist to draw labs this morning. Educated pt. And still refused and stated to "come back later."
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
## 2016-05-31 ENCOUNTER — Encounter (HOSPITAL_COMMUNITY): Payer: Self-pay | Admitting: Surgery

## 2016-05-31 DIAGNOSIS — D72829 Elevated white blood cell count, unspecified: Secondary | ICD-10-CM

## 2016-05-31 DIAGNOSIS — D509 Iron deficiency anemia, unspecified: Secondary | ICD-10-CM

## 2016-05-31 DIAGNOSIS — G8220 Paraplegia, unspecified: Secondary | ICD-10-CM

## 2016-05-31 DIAGNOSIS — G8929 Other chronic pain: Secondary | ICD-10-CM

## 2016-05-31 DIAGNOSIS — M6282 Rhabdomyolysis: Secondary | ICD-10-CM

## 2016-05-31 DIAGNOSIS — D649 Anemia, unspecified: Secondary | ICD-10-CM

## 2016-05-31 DIAGNOSIS — M86151 Other acute osteomyelitis, right femur: Secondary | ICD-10-CM

## 2016-05-31 DIAGNOSIS — L8990 Pressure ulcer of unspecified site, unspecified stage: Secondary | ICD-10-CM

## 2016-05-31 DIAGNOSIS — E43 Unspecified severe protein-calorie malnutrition: Secondary | ICD-10-CM | POA: Diagnosis present

## 2016-05-31 HISTORY — DX: Unspecified severe protein-calorie malnutrition: E43

## 2016-05-31 LAB — PREALBUMIN: PREALBUMIN: 3.1 mg/dL — AB (ref 18–38)

## 2016-05-31 LAB — CBC
HCT: 24.4 % — ABNORMAL LOW (ref 39.0–52.0)
Hemoglobin: 8 g/dL — ABNORMAL LOW (ref 13.0–17.0)
MCH: 26.8 pg (ref 26.0–34.0)
MCHC: 32.8 g/dL (ref 30.0–36.0)
MCV: 81.9 fL (ref 78.0–100.0)
PLATELETS: 367 10*3/uL (ref 150–400)
RBC: 2.98 MIL/uL — ABNORMAL LOW (ref 4.22–5.81)
RDW: 15.3 % (ref 11.5–15.5)
WBC: 17.6 10*3/uL — AB (ref 4.0–10.5)

## 2016-05-31 LAB — BASIC METABOLIC PANEL
Anion gap: 9 (ref 5–15)
BUN: 19 mg/dL (ref 6–20)
CO2: 17 mmol/L — AB (ref 22–32)
CREATININE: 1.18 mg/dL (ref 0.61–1.24)
Calcium: 7.1 mg/dL — ABNORMAL LOW (ref 8.9–10.3)
Chloride: 116 mmol/L — ABNORMAL HIGH (ref 101–111)
GFR calc Af Amer: >60 mL/min (ref >60)
GFR calc non Af Amer: >60 mL/min (ref >60)
GLUCOSE: 121 mg/dL — AB (ref 65–99)
Potassium: 2.9 mmol/L — ABNORMAL LOW (ref 3.5–5.1)
SODIUM: 142 mmol/L (ref 135–145)

## 2016-05-31 LAB — HEPATIC FUNCTION PANEL
ALBUMIN: 1.5 g/dL — AB (ref 3.5–5.0)
ALT: 12 U/L — ABNORMAL LOW (ref 17–63)
AST: 16 U/L (ref 15–41)
Alkaline Phosphatase: 66 U/L (ref 38–126)
Total Bilirubin: 0.4 mg/dL (ref 0.3–1.2)
Total Protein: 5.8 g/dL — ABNORMAL LOW (ref 6.5–8.1)

## 2016-05-31 LAB — MAGNESIUM: Magnesium: 1 mg/dL — ABNORMAL LOW (ref 1.7–2.4)

## 2016-05-31 LAB — ABO/RH: ABO/RH(D): B POS

## 2016-05-31 LAB — C-REACTIVE PROTEIN: CRP: 22 mg/dL — ABNORMAL HIGH (ref <1.0)

## 2016-05-31 LAB — SEDIMENTATION RATE: SED RATE: 102 mm/h — AB (ref 0–16)

## 2016-05-31 MED ORDER — DEXTROSE 5 % IV SOLN
3.0000 g | Freq: Once | INTRAVENOUS | Status: AC
  Administered 2016-05-31: 3 g via INTRAVENOUS
  Filled 2016-05-31 (×2): qty 6

## 2016-05-31 MED ORDER — PSYLLIUM 95 % PO PACK
1.0000 | PACK | Freq: Two times a day (BID) | ORAL | Status: DC
  Administered 2016-06-01 – 2016-06-02 (×3): 1 via ORAL
  Filled 2016-05-31 (×6): qty 1

## 2016-05-31 MED ORDER — ENOXAPARIN SODIUM 60 MG/0.6ML ~~LOC~~ SOLN: 60.0000 mg | SUBCUTANEOUS | Status: DC

## 2016-05-31 MED ORDER — HEPARIN SODIUM (PORCINE) 5000 UNIT/ML IJ SOLN
5000.0000 [IU] | Freq: Two times a day (BID) | INTRAMUSCULAR | Status: DC
  Administered 2016-06-03 – 2016-06-04 (×3): 5000 [IU] via SUBCUTANEOUS
  Filled 2016-05-31 (×5): qty 1

## 2016-05-31 MED ORDER — POTASSIUM CHLORIDE 10 MEQ/100ML IV SOLN
10.0000 meq | INTRAVENOUS | Status: AC
  Administered 2016-05-31 (×4): 10 meq via INTRAVENOUS
  Filled 2016-05-31 (×3): qty 100

## 2016-05-31 MED ORDER — HYDROMORPHONE HCL 1 MG/ML IJ SOLN
1.0000 mg | INTRAMUSCULAR | Status: DC | PRN
  Administered 2016-05-31 – 2016-06-01 (×6): 1 mg via INTRAVENOUS
  Filled 2016-05-31 (×6): qty 1

## 2016-05-31 MED ORDER — POTASSIUM CHLORIDE 20 MEQ/15ML (10%) PO SOLN
20.0000 meq | Freq: Once | ORAL | Status: AC
Start: 2016-05-31 — End: 2016-05-31
  Administered 2016-05-31: 20 meq via ORAL
  Filled 2016-05-31: qty 15

## 2016-05-31 MED ORDER — OXYCODONE-ACETAMINOPHEN 7.5-325 MG PO TABS
1.0000 | ORAL_TABLET | ORAL | Status: DC | PRN
  Administered 2016-05-31 – 2016-06-01 (×2): 1 via ORAL
  Administered 2016-06-01 – 2016-06-03 (×10): 2 via ORAL
  Administered 2016-06-03: 1 via ORAL
  Administered 2016-06-03: 2 via ORAL
  Administered 2016-06-03 (×2): 1 via ORAL
  Administered 2016-06-03 – 2016-06-04 (×3): 2 via ORAL
  Filled 2016-05-31: qty 2
  Filled 2016-05-31: qty 1
  Filled 2016-05-31: qty 2
  Filled 2016-05-31: qty 1
  Filled 2016-05-31 (×2): qty 2
  Filled 2016-05-31: qty 1
  Filled 2016-05-31: qty 2
  Filled 2016-05-31: qty 1
  Filled 2016-05-31 (×8): qty 2
  Filled 2016-05-31 (×3): qty 1

## 2016-05-31 MED ORDER — PRO-STAT SUGAR FREE PO LIQD
30.0000 mL | Freq: Three times a day (TID) | ORAL | Status: DC
  Administered 2016-05-31 – 2016-06-03 (×6): 30 mL via ORAL
  Filled 2016-05-31 (×9): qty 30

## 2016-05-31 NOTE — Plan of Care (Signed)
Problem: Nutrition: Goal: Adequate nutrition will be maintained Outcome: Progressing Diet is full liqs. Pt has tolerated well so far, will continue to monitor

## 2016-05-31 NOTE — Progress Notes (Signed)
Surgical PA was at bedside and request wound supplies to be placed in room for Surgery representaticve to complete first dressing change on 08/31.
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
## 2016-05-31 NOTE — Consult Note (Signed)
[place] Nurse ostomy consult note Stoma type/location: LUQ end colostomy Stomal assessment/size: 2 and 1/2 inches round, red, edematous, raised Peristomal assessment: intact Treatment options for stomal/peristomal skin: two skin barrier rings joined together (single thickness) Output serosanguinous liquid (no stool, no flatus) Ostomy pouching: 2pc. 2 and 3/4 inch pouching system with two skin barrier rings needed to encircle stoma  Education provided: None Enrolled patient in [facility] program: No It appears that patient will require Rehab or SNF following this admission for his wound and ostomy care needs. [facility] nursing team will follow, and will remain available to this patient, the nursing, surgical and medical teams.   Thanks, Maudie Flakes, MSN, RN, [facility], Arther Abbott  Pager# [phone]

## 2016-05-31 NOTE — Progress Notes (Signed)
Patient was not agreeable to turning every 2 hours. Pt refused to have pillows placed to distribute pressure on pressure points. prevolon boots refused also. Education provided.
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
## 2016-05-31 NOTE — Progress Notes (Signed)
Prealbumin 3.1 - Malnutrion  I have ask Nutrition to see.  He is on full liquids and he can start on supplements at any time.

## 2016-05-31 NOTE — Progress Notes (Signed)
On call provider contacted Johnathan Hausen, MD and report that patient's recent I and D  decubitus ulcer was bleeding profusely and had saturated the dressing prior to apply tape to reinforce and that bed pad was saturated as well. Dr. Hassell Done instructed to apply pressure, informed MD that the intervention has already occurred and   that patient is position supine and despite body weight bleeding has not ceased. Martin instructed to to apply pressure regardless of previous intervention, and stated that no action could be taken on the floor unit that Dr. Johney Maine would possibly need to take the patient into the OR for intervention.
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
## 2016-05-31 NOTE — Progress Notes (Signed)
Pressure reapplied to wound as was not successful in ending the bleeding, patient then refused to allow RN to view area and stated t" Im hurting, don't touch me, and I want to get some rest and not be bothered". Explained to patient that RN intention was to to assess if bleeding slowed. Pt refused. Ardelle Park, MD aware. MD stated that patient would be visited today.

## 2016-05-31 NOTE — Progress Notes (Signed)
1 Day Post-Op  Subjective: Sleeping, but wakes easily.  Say he has allot of pain.  It sounds like he  Is prescribed 7.5 Oxycodone, but he take between 30-90 mg daily from alternate sources.  His ostomy bag is about 2/3 full with dark brown/reddish fluid.  He was bleeding from sacral sites after Lovenox last PM.  Dr. Johney Maine wants to leave dressings intact today and take down tomorrow.  Nurse says she just emptied the bag before I came in so he is most likely loosing a fair amount of fluid from here.  Sites ok. Dilaudid 2 mg yesterday, Percocet 7.5/325 3 yesterday, 1 today so far. Objective: Vital signs in last 24 hours: Temp:  [97.1 F (36.2 C)-98.2 F (36.8 C)] 98.2 F (36.8 C) (08/30 0400) Pulse Rate:  [60-76] 76 (08/30 0400) Resp:  [12-21] 15 (08/30 0400) BP: (128-174)/(77-108) 145/86 (08/30 0400) SpO2:  [100 %] 100 % (08/30 0400) Last BM Date: 05/30/16 PO not recorded Huge BM in OR yesterday IV 2300 Urine 2100 Afebrile, VSS, BP up some. K+ 2.9 Cl 116 Mag 1.0 Creatinine is down to 1.18 WBC still up 17.6 H/H 8/24 Intake/Output from previous day: 08/29 0701 - 08/30 0700 In: 2377.5 [I.V.:2127.5; IV Piggyback:250] Out: 2275 [Urine:2175; Blood:100] Intake/Output this shift: No intake/output data recorded.  General appearance: alert, cooperative and no distress GI: soft, few BS, he has allot of fluid in the ostomy bag along with allot of gas.  Described above.  Sites OK few BS..  Ostomy edematous and a little bloody, but ok.   Lab Results:   Recent Labs  05/30/16 0842 05/30/16 1500 05/31/16 0551  WBC 18.1*  --  17.6*  HGB 9.1* 11.2* 8.0*  HCT 27.5* 33.0* 24.4*  PLT 404*  --  367    BMET  Recent Labs  05/30/16 0842 05/30/16 1500 05/31/16 0551  NA 143 149* 142  K 2.7* 3.0* 2.9*  CL 116*  --  116*  CO2 21*  --  17*  GLUCOSE 104* 102* 121*  BUN 24*  --  19  CREATININE 1.01  --  1.18  CALCIUM 7.5*  --  7.1*   PT/INR No results for input(s): LABPROT, INR in the  last 72 hours.   Recent Labs Lab 05/26/16 1801 05/27/16 0511 05/31/16 0551  AST 45* 37 16  ALT 34 26 12*  ALKPHOS 105 90 66  BILITOT 1.1 0.7 0.4  PROT 8.9* 7.1 5.8*  ALBUMIN 2.6* 2.0* 1.5*     Lipase  No results found for: LIPASE   Studies/Results: No results found.  Medications: . alvimopan  12 mg Oral BID  . cefTRIAXone (ROCEPHIN)  IV  2 g Intravenous Q24H  . Chlorhexidine Gluconate Cloth  6 each Topical Q0600  . [START ON 06/01/2016] collagenase   Topical Daily  . [START ON 06/02/2016] enoxaparin (LOVENOX) injection  60 mg Subcutaneous Q24H  . feeding supplement (ENSURE ENLIVE)  237 mL Oral BID BM  . feeding supplement (PRO-STAT SUGAR FREE 64)  30 mL Oral BID WC  . lip balm  1 application Topical BID  . mupirocin ointment  1 application Nasal BID  . pantoprazole  40 mg Oral Daily  . potassium chloride  10 mEq Intravenous Q1 Hr x 4  . psyllium  1 packet Oral BID  . sodium chloride flush  3 mL Intravenous Q12H  . vancomycin  1,000 mg Intravenous Q12H  . vitamin C  500 mg Oral BID   . 0.9 % NaCl  with KCl 40 mEq / L 75 mL/hr (05/31/16 0322)    Chronic pain with prescribed and unprescribed oxycodone for pain relief Tobacco use Cocaine use Body mass index is 36.5  Assessment/Plan Sacral and ischial decubitus with gangrene S/p fecal disimpaction, debridement of sacral decubitus ulcer including bone, periosteum, muscle fat and skin; laparoscopic diverting end colostomy, 05/30/16 Dr. Michael Boston Fall from chair, on floor 5 days Acute kidney injury - improving Hypokalemia, hypomagnesemia  K+ ordered and I have added Mag sulfate 3 Gm IV today  Bleeding from site post op - anemia H/H 8/24 Paraplegia since 1981 FEN: Full liquids/IV fluids ID:  Flagyl/Neomycin bowel prep,Cefotetan pre op;  Day 6 Rocephin  Vancomycin IV day 3 DVT:  Lovenox - on hold for 48 hours Pt got a dose last PM at 2338 hours/ SCD's not on       Plan:  KCL ordered,  I am replacing mag. Wound care to  see and help with the ostomy.  We will take down sacral and ischial dressings tomorrow.  Watch labs, and bleeding.  Holding Lovenox x 48 hours.  I am going to up his dilaudid some, along with the percocet.7.5/325.  I think Medicine will have to work on long term solution to chronic pain meds, ordered vs what he actually takes.    LOS: 5 days    Attilio Zeitler 05/31/2016 [phone]

## 2016-05-31 NOTE — Plan of Care (Signed)
Problem: Activity: Goal: Risk for activity intolerance will decrease Outcome: Completed/Met Date Met: 05/31/16 Tun pt every 2 hours

## 2016-05-31 NOTE — Progress Notes (Signed)
Pt refused to have continuous pulse ox, additionally ostomy pooch became loose a few hours post op and required a replacement of appliance. Watery stool and moderate amount of blood is noted to have drained

## 2016-05-31 NOTE — Progress Notes (Signed)
PT Cancellation Note  Patient Details Name: [name] MRN: [id number] DOB: December 20, 1965   Cancelled Treatment:    Reason Eval/Treat Not Completed: Patient not medically ready (hydro to start tomorrow per surgery)   [facility] 05/31/2016, 10:18 AM

## 2016-06-01 DIAGNOSIS — E43 Unspecified severe protein-calorie malnutrition: Secondary | ICD-10-CM

## 2016-06-01 DIAGNOSIS — B9689 Other specified bacterial agents as the cause of diseases classified elsewhere: Secondary | ICD-10-CM

## 2016-06-01 DIAGNOSIS — L089 Local infection of the skin and subcutaneous tissue, unspecified: Secondary | ICD-10-CM

## 2016-06-01 DIAGNOSIS — N179 Acute kidney failure, unspecified: Secondary | ICD-10-CM

## 2016-06-01 DIAGNOSIS — M4628 Osteomyelitis of vertebra, sacral and sacrococcygeal region: Secondary | ICD-10-CM

## 2016-06-01 DIAGNOSIS — Z933 Colostomy status: Secondary | ICD-10-CM

## 2016-06-01 DIAGNOSIS — T148 Other injury of unspecified body region: Secondary | ICD-10-CM

## 2016-06-01 DIAGNOSIS — M868X8 Other osteomyelitis, other site: Secondary | ICD-10-CM

## 2016-06-01 LAB — VANCOMYCIN, TROUGH: VANCOMYCIN TR: 51 ug/mL — AB (ref 15–20)

## 2016-06-01 LAB — CBC
HCT: 22.2 % — ABNORMAL LOW (ref 39.0–52.0)
Hemoglobin: 7.4 g/dL — ABNORMAL LOW (ref 13.0–17.0)
MCH: 27.6 pg (ref 26.0–34.0)
MCHC: 33.3 g/dL (ref 30.0–36.0)
MCV: 82.8 fL (ref 78.0–100.0)
PLATELETS: 337 10*3/uL (ref 150–400)
RBC: 2.68 MIL/uL — ABNORMAL LOW (ref 4.22–5.81)
RDW: 15.5 % (ref 11.5–15.5)
WBC: 17.1 10*3/uL — AB (ref 4.0–10.5)

## 2016-06-01 LAB — CULTURE, BLOOD (ROUTINE X 2)
CULTURE: NO GROWTH
CULTURE: NO GROWTH

## 2016-06-01 LAB — BASIC METABOLIC PANEL
ANION GAP: 4 — AB (ref 5–15)
BUN: 13 mg/dL (ref 6–20)
CALCIUM: 7.1 mg/dL — AB (ref 8.9–10.3)
CO2: 18 mmol/L — ABNORMAL LOW (ref 22–32)
CREATININE: 1.05 mg/dL (ref 0.61–1.24)
Chloride: 117 mmol/L — ABNORMAL HIGH (ref 101–111)
GLUCOSE: 111 mg/dL — AB (ref 65–99)
Potassium: 3.5 mmol/L (ref 3.5–5.1)
Sodium: 139 mmol/L (ref 135–145)

## 2016-06-01 LAB — PREPARE RBC (CROSSMATCH)

## 2016-06-01 LAB — PHOSPHORUS: Phosphorus: 2.5 mg/dL (ref 2.5–4.6)

## 2016-06-01 LAB — MAGNESIUM: Magnesium: 1.5 mg/dL — ABNORMAL LOW (ref 1.7–2.4)

## 2016-06-01 MED ORDER — DEXTROSE 5 % IV SOLN
2.0000 g | Freq: Three times a day (TID) | INTRAVENOUS | Status: DC
  Administered 2016-06-01: 2 g via INTRAVENOUS
  Filled 2016-06-01 (×2): qty 2

## 2016-06-01 MED ORDER — HYDROMORPHONE HCL 1 MG/ML IJ SOLN
0.5000 mg | INTRAMUSCULAR | Status: DC | PRN
  Administered 2016-06-01 – 2016-06-04 (×16): 0.5 mg via INTRAVENOUS
  Filled 2016-06-01 (×16): qty 1

## 2016-06-01 MED ORDER — SILVER NITRATE-POT NITRATE 75-25 % EX MISC
10.0000 | CUTANEOUS | Status: DC | PRN
  Filled 2016-06-01: qty 10

## 2016-06-01 MED ORDER — PREGABALIN 50 MG PO CAPS
50.0000 mg | ORAL_CAPSULE | Freq: Two times a day (BID) | ORAL | Status: DC
  Administered 2016-06-01 – 2016-06-04 (×7): 50 mg via ORAL
  Filled 2016-06-01 (×7): qty 1

## 2016-06-01 MED ORDER — METRONIDAZOLE 500 MG PO TABS
500.0000 mg | ORAL_TABLET | Freq: Three times a day (TID) | ORAL | Status: DC
  Administered 2016-06-01 – 2016-06-04 (×9): 500 mg via ORAL
  Filled 2016-06-01 (×9): qty 1

## 2016-06-01 MED ORDER — SODIUM CHLORIDE 0.9 % IV SOLN
Freq: Once | INTRAVENOUS | Status: AC
  Administered 2016-06-01: 16:00:00 via INTRAVENOUS

## 2016-06-01 MED ORDER — K PHOS MONO-SOD PHOS DI & MONO 155-852-130 MG PO TABS
500.0000 mg | ORAL_TABLET | Freq: Two times a day (BID) | ORAL | Status: DC
Start: 2016-06-01 — End: 2016-06-04
  Administered 2016-06-01 – 2016-06-04 (×7): 500 mg via ORAL
  Filled 2016-06-01 (×7): qty 2

## 2016-06-01 MED ORDER — MAGNESIUM SULFATE 2 GM/50ML IV SOLN
2.0000 g | Freq: Once | INTRAVENOUS | Status: AC
  Administered 2016-06-01: 2 g via INTRAVENOUS
  Filled 2016-06-01: qty 50

## 2016-06-01 NOTE — Progress Notes (Signed)
Pt plan to discharge to a SNF at present time. CSW to follow.

## 2016-06-01 NOTE — Clinical Social Work Note (Signed)
Clinical Social Work Assessment  Patient Details  Name: [name] MRN: [id number] Date of Birth: 05/05/1966  Date of referral:  06/01/16               Reason for consult:  Facility Placement                Permission sought to share information with:  Facility Art therapist granted to share information::  Yes, Verbal Permission Granted  Name::        Agency::     Relationship::     Contact Information:     Housing/Transportation Living arrangements for the past 2 months:  Apartment Source of Information:  Patient Patient Interpreter Needed:  None Criminal Activity/Legal Involvement Pertinent to Current Situation/Hospitalization:  No - Comment as needed Significant Relationships:  None Lives with:  Self Do you feel safe going back to the place where you live?  No Need for family participation in patient care:  No (Coment)  Care giving concerns:  CSW reviewed patient's chart & wound care notes indicating that patient would benefit from going to SNF/Rehab at discharge.    Social Worker assessment / plan:  CSW met with patient who is agreeable with plan for SNF. CSW reviewed SNF bed offers & patient has accepted bed at [facility]. CSW confirmed with Santiago Glad that they would be able to take patient at discharge.   Employment status:  Disabled (Comment on whether or not currently receiving Disability) Insurance information:  Medicaid In [place], [employer] PT Recommendations:  Not assessed at this time Information / Referral to community resources:  [place]  Patient/Family's Response to care:  Patient appears disinterested in [place] visit and kept eyes closed during assessment. Per RN, patient was complaining earlier about wanting to get sleep.   Patient/Family's Understanding of and Emotional Response to Diagnosis, Current Treatment, and Prognosis:    Emotional Assessment Appearance:  Appears older than stated  age Attitude/Demeanor/Rapport:    Affect (typically observed):    Orientation:  Oriented to Self, Oriented to Place, Oriented to Situation, Oriented to  Time Alcohol / Substance use:    Psych involvement (Current and /or in the community):     Discharge Needs  Concerns to be addressed:    Readmission within the last 30 days:    Current discharge risk:    Barriers to Discharge:      Standley Brooking, LCSW 06/01/2016, 12:33 PM

## 2016-06-01 NOTE — Progress Notes (Signed)
[facility]    Date of Admission:  05/26/2016   Total days of antibiotics 7        Day 7 ceftriaxone        Day 7 vanco          ID: Nayef Delduca is a 50 y.o. male with with paraplegia admitted for acute dehydration, loss of consciousness found to have infected sacral decub with signs of osteomyelitis s/p debridement and diverting colostomy Principal Problem:   Rhabdomyolysis Active Problems:   Paraplegia (HCC)   Hypokalemia   Acute renal failure (ARF) (HCC)   Dehydration   Chronic pain   Leukocytosis   Decubitus ulcers   Wound infection ([place])   Anemia   Hypertension   Prolonged QT interval   Protein-calorie malnutrition, severe (HCC)   Osteomyelitis with necrosis of sacrum    Colostomy in place for fecal diversion    Subjective: Afebrile, he still has some oozing to wound bed at dressing change and ostomy, but less than yesterday per surgery note. Received blood transfusion yesterday and today. Sleeping this afternoon. Still has elevated leukocytosis of 17K, with sed rate of 100. RN reports that he doesn't like to turn/off load   Medications:  . cefTRIAXone (ROCEPHIN)  IV  2 g Intravenous Q24H  . Chlorhexidine Gluconate Cloth  6 each Topical Q0600  . collagenase   Topical Daily  . feeding supplement (ENSURE ENLIVE)  237 mL Oral BID BM  . feeding supplement (PRO-STAT SUGAR FREE 64)  30 mL Oral TID  . heparin subcutaneous  5,000 Units Subcutaneous Q12H  . lip balm  1 application Topical BID  . magnesium sulfate 1 - 4 g bolus IVPB  2 g Intravenous Once  . mupirocin ointment  1 application Nasal BID  . pantoprazole  40 mg Oral Daily  . phosphorus  500 mg Oral BID  . pregabalin  50 mg Oral BID  . psyllium  1 packet Oral BID  . sodium chloride flush  3 mL Intravenous Q12H  . vancomycin  1,000 mg Intravenous Q12H  . vitamin C  500 mg Oral BID    Objective: Vital signs in last 24 hours: Temp:  [97.5 F (36.4 C)-98.1 F (36.7 C)] 98.1 F  (36.7 C) (08/31 1535) Pulse Rate:  [71-82] 71 (08/31 1535) Resp:  [16-18] 16 (08/31 1535) BP: (134-142)/(61-70) 142/69 (08/31 1535) SpO2:  [100 %] 100 % (08/31 1535) Physical Exam  Constitutional: He is sleeping, arouses to verbal stimuli. He appears chronically ill and well-nourished. No distress.  HENT:  Mouth/Throat: Oropharynx is clear and moist. No oropharyngeal exudate.  Cardiovascular: Normal rate, regular rhythm and normal heart sounds. Exam reveals no gallop and no friction rub.  No murmur heard.  Pulmonary/Chest: Effort normal and breath sounds normal. No respiratory distress. He has no wheezes.  Abdominal: Soft. Bowel sounds are normal. He exhibits no distension. There is no tenderness. Ostomy in left quadrant Skin: Skin is warm and dry. No rash noted. No erythema. Pt refusing to turn to look at sacrum    Lab Results  Recent Labs  05/31/16 0551 06/01/16 0931  WBC 17.6* 17.1*  HGB 8.0* 7.4*  HCT 24.4* 22.2*  NA 142 139  K 2.9* 3.5  CL 116* 117*  CO2 17* 18*  BUN 19 13  CREATININE 1.18 1.05   Liver Panel  Recent Labs  05/31/16 0551  PROT 5.8*  ALBUMIN 1.5*  AST 16  ALT 12*  ALKPHOS 66  BILITOT 0.4  BILIDIR <0.1*  IBILI NOT CALCULATED   Sedimentation Rate  Recent Labs  05/31/16 1549  ESRSEDRATE 102*   C-Reactive Protein  Recent Labs  05/31/16 1549  CRP 22.0*    Microbiology: Positive for mrsa Studies/Results: No results found.   Assessment/Plan: Pelvic osteo = no cultures to guide regimen since he was started many days prior to his debridement and cx would likely have been mixed flora, due to fecal contamination. He is colonized with mrsa and pseudomonas in the past.  - will recommend to treat with vancomycin x 6 wk, cefepime x 6 wk and metronidazole 580m TID PO - will get picc line tomorrow am - recommend SNF placement due to need for dressing change, off loading, and IV abtx   Will sign off  --------------------Listed below are  home health orders for SNF----------------------  Diagnosis: Pelvic osteo  Culture Result: negative  Allergies  Allergen Reactions  . No Known Allergies     Discharge antibiotics: Vancomycin and cefepime Per pharmacy protocol  Aim for Vancomycin trough 15-20 (unless otherwise indicated) Duration: 6 wk End Date: Oct 5th  [facility] Protocol:  Labs weekly while on IV antibiotics: _x_ CBC with differential _x_ BMP __ CMP _x_ CRP _x_ ESR _x_ Vancomycin trough  Fax weekly labs to [phone] Clinic Follow Up Appt: In 6 wk  @ dr Hiram Mciver   SBaxter Flattery [facility] Cell: [phone]: [phone]  06/01/2016, 3:49 PM

## 2016-06-01 NOTE — Progress Notes (Signed)
2 Days Post-Op  Subjective: We took dressings down and he did have some bleeding in the sacral ulcer area, this was controlled with pressure and silver nitrate sticks.  Most of the area is pretty clean.  He still has some non viable tissue at the base but we can get this with hydrotherapy.  Repacked all the sites with wet Kerlix, using sterile saline.  It took most of one roll to do the main open site.  He complained but did well with the dressing change.  The other open sites are OK.  Ostomy looks about the same.  Drainage is still brown watery, less red in it today.   Objective: Vital signs in last 24 hours: Temp:  [97.5 F (36.4 C)-98.7 F (37.1 C)] 97.5 F (36.4 C) (08/31 0334) Pulse Rate:  [72-82] 74 (08/31 0334) Resp:  [18] 18 (08/31 0334) BP: (129-135)/(61-79) 135/70 (08/31 0334) SpO2:  [100 %] 100 % (08/31 0334) Last BM Date: 06/01/16 PO 120 IV 1800 550 from ostomy Afebrile, VSS K+ up to 3.5 this AM WBC still 17K Intake/Output from previous day: 08/30 0701 - 08/31 0700 In: 3245 [P.O.:120; I.V.:1800; IV Piggyback:500] Out: 1075 [Urine:525; Stool:550] Intake/Output this shift: No intake/output data recorded.  General appearance: alert, no distress and uncomfortable. GI: soft, few BS, some gas and browish fluid in ostomy bag.  No stool.  he did have some mucus on bedding I thiink is from rectum.  Some areas of skin is denuded, I put telfa on these sites.   Incision/Wound:  Lab Results:   Recent Labs  05/31/16 0551 06/01/16 0931  WBC 17.6* 17.1*  HGB 8.0* 7.4*  HCT 24.4* 22.2*  PLT 367 337    BMET  Recent Labs  05/31/16 0551 06/01/16 0931  NA 142 139  K 2.9* 3.5  CL 116* 117*  CO2 17* 18*  GLUCOSE 121* 111*  BUN 19 13  CREATININE 1.18 1.05  CALCIUM 7.1* 7.1*   PT/INR No results for input(s): LABPROT, INR in the last 72 hours.   Recent Labs Lab 05/26/16 1801 05/27/16 0511 05/31/16 0551  AST 45* 37 16  ALT 34 26 12*  ALKPHOS 105 90 66  BILITOT  1.1 0.7 0.4  PROT 8.9* 7.1 5.8*  ALBUMIN 2.6* 2.0* 1.5*     Lipase  No results found for: LIPASE   Studies/Results: No results found.  Medications: . sodium chloride   Intravenous Once  . cefTRIAXone (ROCEPHIN)  IV  2 g Intravenous Q24H  . Chlorhexidine Gluconate Cloth  6 each Topical Q0600  . collagenase   Topical Daily  . feeding supplement (ENSURE ENLIVE)  237 mL Oral BID BM  . feeding supplement (PRO-STAT SUGAR FREE 64)  30 mL Oral TID  . heparin subcutaneous  5,000 Units Subcutaneous Q12H  . lip balm  1 application Topical BID  . mupirocin ointment  1 application Nasal BID  . pantoprazole  40 mg Oral Daily  . phosphorus  500 mg Oral BID  . pregabalin  50 mg Oral BID  . psyllium  1 packet Oral BID  . sodium chloride flush  3 mL Intravenous Q12H  . vancomycin  1,000 mg Intravenous Q12H  . vitamin C  500 mg Oral BID   . 0.9 % NaCl with KCl 40 mEq / L 75 mL/hr (06/01/16 0630)   Assessment/Plan Sacral and ischial decubitus with gangrene S/p fecal disimpaction, debridement of sacral decubitus ulcer including bone, periosteum, muscle fat and skin; laparoscopic diverting end colostomy,  05/30/16 Dr. Michael Boston Fall from chair, on floor 5 days Acute kidney injury - improving Hypokalemia, hypomagnesemia  - K+ is up and replace with more Mag today + K+ in IV fluids Bleeding from site post op - anemia H/H 8/24 Paraplegia since 1981 Anemia - transfused 1 unit 06/01/16 Malnutrition - prealbumin 3.1 05/31/16  -nutrition seeing on 06/01/16 to help with this issue FEN: Full liquids/IV fluids increase rate some today, not taking much PO ID: Flagyl/Neomycin bowel prep,Cefotetan pre op;  Day 6 Rocephin  Vancomycin IV day 3 DVT: Lovenox - on hold for 48 hours Pt got a dose on 05/31/16 PM at 2338 hours/ SCD's not on- pt refusing.    Plan:  I have repacked and controlled bleeding site in sacral wound.  Will restart Hydrotherapy tomorrow.  Replace mag, continue IV mag and K+.  Leave on full  liquids till he has some stool coming thru the ostomy.    LOS: 6 days    Youcef Klas 06/01/2016 [phone]

## 2016-06-01 NOTE — NC FL2 (Signed)
[place] LEVEL OF CARE SCREENING TOOL     IDENTIFICATION  Patient Name: [name] Birthdate: [birth year] Sex: male Admission Date (Current Location): 05/26/2016  [facility] and [place] Number:  Herbalist and Address:  [facility],  [place] [address], [place]      Provider Number: [id number]  Attending Physician Name and Address:  Albertine Patricia, MD  Relative Name and Phone Number:       Current Level of Care: Hospital Recommended Level of Care: [place] Prior Approval Number:    Date Approved/Denied:   PASRR Number:   [id number]  Discharge Plan: Home    Current Diagnoses: Patient Active Problem List   Diagnosis Date Noted  . Protein-calorie malnutrition, severe ([place]) 05/31/2016  . Hypokalemia 05/26/2016  . Acute renal failure (ARF) ([place]) 05/26/2016  . Dehydration 05/26/2016  . Chronic pain 05/26/2016  . Leukocytosis 05/26/2016  . Decubitus ulcers 05/26/2016  . Wound infection ([place]) 05/26/2016  . Anemia 05/26/2016  . Hypertension 05/26/2016  . Prolonged QT interval 05/26/2016  . Rhabdomyolysis 05/26/2016  . Paraplegia (HCC)     Orientation RESPIRATION BLADDER Height & Weight     Self, Time, Situation, Place  Normal Incontinent, Indwelling catheter Weight: 268 lb 12.8 oz (121.9 kg) Height:  6' (182.9 cm)  BEHAVIORAL SYMPTOMS/MOOD NEUROLOGICAL BOWEL NUTRITION STATUS      Incontinent, Colostomy Diet (Full Liquid)  AMBULATORY STATUS COMMUNICATION OF NEEDS Skin   Extensive Assist Verbally PU Stage and Appropriate Care (Pressure Ulcer 05/26/16 Unstageable - Full thickness tissue loss in which the base of the ulcer is covered by slough (yellow, tan, gray, green or brown) and/or eschar (tan, brown or black) in the wound bed. (right  Ischial tuberosity))     PressureUlcer08/25/17Unstageable-Fullthicknesstissuelossinwhichthebaseoftheulceriscoveredbyslough(yellow,tan,gray,greenorbrown)and/oreschar(tan,brownorblack)inthewoundbed. (mid sacrum)  PressureUlcer08/25/17StageII-Partialthicknesslossofdermispresentingasashallowopenulcerwithared,pinkwoundbedwithoutslough. (right hip)  Incision(Closed)08/29/17Abdomen               Personal Care Assistance Level of Assistance  Bathing, Dressing Bathing Assistance: Limited assistance   Dressing Assistance: Limited assistance     Functional Limitations Info             SPECIAL CARE FACTORS FREQUENCY                       Contractures      Additional Factors Info  Code Status, Allergies, Isolation Precautions Code Status Info: Fullcode Allergies Info: NKDA     Isolation Precautions Info: MRSA     Current Medications (06/01/2016):  This is the current hospital active medication list Current Facility-Administered Medications  Medication Dose Route Frequency Provider Last Rate Last Dose  . 0.9 % NaCl with KCl 40 mEq / L  infusion   Intravenous Continuous Nishant Dhungel, MD 75 mL/hr at 06/01/16 0630 75 mL/hr at 06/01/16 0630  . alum & mag hydroxide-simeth (MAALOX/MYLANTA) 200-200-20 MG/5ML suspension 30 mL  30 mL Oral Q6H PRN Michael Boston, MD      . cefTRIAXone (ROCEPHIN) 2 g in dextrose 5 % 50 mL IVPB  2 g Intravenous Q24H Polly Cobia, RPH 100 mL/hr at 05/31/16 2007 2 g at 05/31/16 2007  . Chlorhexidine Gluconate Cloth 2 % PADS 6 each  6 each Topical Q0600 Nishant Dhungel, MD   6 each at 06/01/16 0332  . collagenase (SANTYL) ointment   Topical Daily Michael Boston, MD      . feeding supplement (ENSURE ENLIVE) (ENSURE ENLIVE) liquid 237 mL  237 mL Oral BID BM Louellen Molder, MD  237 mL at 05/31/16 1400  . feeding supplement (PRO-STAT SUGAR FREE 64) liquid 30 mL  30 mL Oral TID Albertine Patricia, MD   30 mL at 05/31/16  2238  . heparin injection 5,000 Units  5,000 Units Subcutaneous Q12H Silver Huguenin Elgergawy, MD      . HYDROmorphone (DILAUDID) injection 1 mg  1 mg Intravenous Q3H PRN Earnstine Regal, PA-C   1 mg at 06/01/16 0825  . iopamidol (ISOVUE-300) 61 % injection 30 mL  30 mL Oral Once PRN Nishant Dhungel, MD      . lactated ringers bolus 1,000 mL  1,000 mL Intravenous Q8H PRN Michael Boston, MD      . lip balm (CARMEX) ointment 1 application  1 application Topical BID Michael Boston, MD   1 application at [id number]  . magic mouthwash  15 mL Oral QID PRN Michael Boston, MD      . menthol-cetylpyridinium (CEPACOL) lozenge 3 mg  1 lozenge Oral PRN Michael Boston, MD      . methocarbamol (ROBAXIN) 1,000 mg in dextrose 5 % 50 mL IVPB  1,000 mg Intravenous Q6H PRN Michael Boston, MD      . mupirocin ointment (BACTROBAN) 2 % 1 application  1 application Nasal BID Louellen Molder, MD   1 application at [id number]  . oxyCODONE-acetaminophen (PERCOCET) 7.5-325 MG per tablet 1-2 tablet  1-2 tablet Oral Q4H PRN Earnstine Regal, PA-C   2 tablet at 06/01/16 0825  . pantoprazole (PROTONIX) EC tablet 40 mg  40 mg Oral Daily Toy Baker, MD   40 mg at 06/01/16 0826  . phenol (CHLORASEPTIC) mouth spray 2 spray  2 spray Mouth/Throat PRN Michael Boston, MD      . pregabalin (LYRICA) capsule 50 mg  50 mg Oral BID Albertine Patricia, MD   50 mg at 06/01/16 0826  . psyllium (HYDROCIL/METAMUCIL) packet 1 packet  1 packet Oral BID Earnstine Regal, PA-C      . silver nitrate applicators applicator 10 Stick  10 Stick Topical PRN Earnstine Regal, PA-C      . sodium chloride flush (NS) 0.9 % injection 3 mL  3 mL Intravenous Q12H Toy Baker, MD   3 mL at 05/31/16 2238  . traMADol (ULTRAM) tablet 100 mg  100 mg Oral Q6H PRN Michael Boston, MD   100 mg at 05/31/16 0504  . vancomycin (VANCOCIN) IVPB 1000 mg/200 mL premix  1,000 mg Intravenous Q12H Anh P Pham, RPH   1,000 mg at 05/31/16 2238  . vitamin C (ASCORBIC ACID) tablet  500 mg  500 mg Oral BID Michael Boston, MD   500 mg at 06/01/16 [id number]     Discharge Medications: Please see discharge summary for a list of discharge medications.  Relevant Imaging Results:  Relevant Lab Results:   Additional Information SSN: [id number]  Standley Brooking, LCSW

## 2016-06-01 NOTE — Progress Notes (Signed)
Pharmacy Antibiotic Note  [name] is a 50 y.o. paraplegic male admitted on 05/26/2016 with UTI and decubitus ulcers as possible source of leukocytosis.  Patient spent 5 days on floor after falling out of wheelchair PTA. Patient was started on ceftriaxone and vancomycin on admission with vancomycin d/ced on 8/27.  Vancomycin resumed back on 8/28 for wound infection. Plan for surgical debridement for necrotic & infected sacral decubiti and possible colostomy on 8/29.  8/31: SCr much improved, WBC remains elevated.  S/p I/D 8/29 of sacral wound (including bone, periosteum, muscle fat and skin) + colostomy.  ID following for acute on chronic pelvic osteomyelitis and E.Coli UTI: plan for 6 weeks therapy, PICC ordered.    Plan: - Check VT tonight at [facility] - Continue Ceftriaxone 2 gm IV q24h per MD - Monitor renal function closely - F/u cultures, clinical course  _________________________  Height: 6' (182.9 cm) Weight: 268 lb 12.8 oz (121.9 kg) IBW/kg (Calculated) : 77.6  Temp (24hrs), Avg:98.1 F (36.7 C), Min:97.5 F (36.4 C), Max:98.7 F (37.1 C)   Recent Labs Lab 05/26/16 1801 05/26/16 1839 05/26/16 2237  05/28/16 0525 05/28/16 1742 05/29/16 0530 05/29/16 0537 05/30/16 0842 05/31/16 0551 06/01/16 0931  WBC 26.7*  --   --   < > 17.3*  --  16.3*  --  18.1* 17.6* 17.1*  CREATININE 5.00*  --   --   < > 3.23* 2.58*  --  1.99* 1.01 1.18 1.05  LATICACIDVEN 1.5 2.12* 1.4  --   --   --   --   --   --   --   --   < > = values in this interval not displayed.  Estimated Creatinine Clearance: 110.9 mL/min (by C-G formula based on SCr of 1.05 mg/dL).    Allergies  Allergen Reactions  . No Known Allergies     Antimicrobials this admission: Rocephin 8/25 >>  Vancomycin 8/25 >> 8/27( MD accidentally d/ced)>> resumed 8/28>>   Dose adjustments this admission: 8/25: 2gm x1, then 1750 q48 8/28: Vanc 1750mg  q48h -->1500 mg q24h for improving renal function 8/29: Vanc 1500mg  q24h  -->1gm q12h for improving renal function   Microbiology results: 8/25 UCx: >100K ecoli (R=cipro, ampi; I= unasyn) 8/25 BCx x2: NGTD 8/28 MRSA PCR (+) 8/29 MRSA PCR Negative / Staph PCR Negative  Thank you for allowing pharmacy to be a part of this patient's care.  Ralene Bathe, PharmD, BCPS 06/01/2016, 10:24 AM  Pager: [phone]

## 2016-06-01 NOTE — Clinical Social Work Placement (Signed)
Patient has a bed at [facility]. CSW has completed FL2 & will continue to follow and assist with discharge when ready. Anticipating possible discharge Saturday, per Dr. Waldron Labs - Santiago Glad at [facility] aware.    Raynaldo Opitz, [facility] Clinical Social Worker cell #: [phone]     CLINICAL SOCIAL WORK PLACEMENT  NOTE  Date:  06/01/2016  Patient Details  Name: [name] MRN: [id number] Date of Birth: Apr 06, 1966  Clinical Social Work is seeking post-discharge placement for this patient at the [place] level of care (*CSW will initial, date and re-position this form in  chart as items are completed):  Yes   Patient/family provided with [place] Work Department's list of facilities offering this level of care within the geographic area requested by the patient (or if unable, by the patient's family).  Yes   Patient/family informed of their freedom to choose among providers that offer the needed level of care, that participate in Medicare, Medicaid or managed care program needed by the patient, have an available bed and are willing to accept the patient.  Yes   Patient/family informed of [facility]'s ownership interest in [facility] and [facility], as well as of the fact that they are under no obligation to receive care at these facilities.  PASRR submitted to EDS on 06/01/16     PASRR number received on 06/01/16     Existing PASRR number confirmed on       FL2 transmitted to all facilities in geographic area requested by pt/family on 06/01/16     FL2 transmitted to all facilities within larger geographic area on       Patient informed that his/her managed care company has contracts with or will negotiate with certain facilities, including the following:        Yes   Patient/family informed of bed offers received.  Patient chooses bed at [facility]     Physician  recommends and patient chooses bed at      Patient to be transferred to [facility] on  .  Patient to be transferred to facility by       Patient family notified on   of transfer.  Name of family member notified:        PHYSICIAN       Additional Comment:    _______________________________________________ Standley Brooking, LCSW 06/01/2016, 12:36 PM

## 2016-06-01 NOTE — Progress Notes (Signed)
PT Cancellation Note  Patient Details Name: Jerran Warshawsky MRN: [id number] DOB: 1966-02-02   Cancelled Treatment:    Reason Eval/Treat Not Completed: Patient not medically ready (start tomorrow per Will, PA)   Amorita Vanrossum,KATHrine E 06/01/2016, 2:23 PM Carmelia Bake, PT, DPT 06/01/2016 Pager: [phone]

## 2016-06-01 NOTE — Progress Notes (Signed)
ABD pads on pt wound had to be changed once during night shift. Pt had bled through the ABD pads and onto regular bed pad. Will pass on to day shift RN about monitoring pt for bleeding. Hgb was 8.0 yesterday but pt refused morning labs today. 06/01/16 Carmela Hurt, RN 6:45 AM
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
## 2016-06-01 NOTE — Progress Notes (Signed)
Nutrition Brief Note  RD consulted for nutritional assessment. RD completed initial assessment 8/27 and follow-up 8/30 when consulted.  Patient is refusing labs and turning in bed. Pt was encouraged to drink supplements yesterday and was compliant. This morning was not provided supplements.  Please continue to encourage supplement intake as pt is not meeting needs with meals.   Will continue to monitor for needs and plan.  Clayton Bibles, MS, RD, LDN Pager: [phone] After Hours Pager: [phone]

## 2016-06-01 NOTE — Progress Notes (Signed)
Pt has refused his prevalon boots and SCD's. He has also refused to be turned unless he wants to. Pt also refused morning labs on 06/01/16 Carmela Hurt, RN 6:41 AM

## 2016-06-01 NOTE — Plan of Care (Signed)
Problem: Physical Regulation: Goal: Ability to maintain clinical measurements within normal limits will improve Outcome: Not Progressing Pt refusing to turn  Problem: Skin Integrity: Goal: Risk for impaired skin integrity will decrease Outcome: Not Progressing Pt refusing to turn

## 2016-06-02 LAB — BASIC METABOLIC PANEL
ANION GAP: 7 (ref 5–15)
BUN: 11 mg/dL (ref 6–20)
CHLORIDE: 116 mmol/L — AB (ref 101–111)
CO2: 16 mmol/L — AB (ref 22–32)
Calcium: 7.1 mg/dL — ABNORMAL LOW (ref 8.9–10.3)
Creatinine, Ser: 0.91 mg/dL (ref 0.61–1.24)
GFR calc non Af Amer: >60 mL/min (ref >60)
GLUCOSE: 89 mg/dL (ref 65–99)
POTASSIUM: 3.3 mmol/L — AB (ref 3.5–5.1)
Sodium: 139 mmol/L (ref 135–145)

## 2016-06-02 LAB — CBC
HEMATOCRIT: 23.7 % — AB (ref 39.0–52.0)
HEMOGLOBIN: 7.6 g/dL — AB (ref 13.0–17.0)
MCH: 26.5 pg (ref 26.0–34.0)
MCHC: 32.1 g/dL (ref 30.0–36.0)
MCV: 82.6 fL (ref 78.0–100.0)
Platelets: 315 10*3/uL (ref 150–400)
RBC: 2.87 MIL/uL — AB (ref 4.22–5.81)
RDW: 15.5 % (ref 11.5–15.5)
WBC: 16.9 10*3/uL — AB (ref 4.0–10.5)

## 2016-06-02 LAB — HEPATITIS C ANTIBODY: HCV Ab: <0.1 s/co ratio (ref 0.0–0.9)

## 2016-06-02 LAB — PREPARE RBC (CROSSMATCH)

## 2016-06-02 LAB — MAGNESIUM: Magnesium: 1.6 mg/dL — ABNORMAL LOW (ref 1.7–2.4)

## 2016-06-02 LAB — HIV ANTIBODY (ROUTINE TESTING W REFLEX): HIV Screen 4th Generation wRfx: NONREACTIVE

## 2016-06-02 LAB — VANCOMYCIN, TROUGH: Vancomycin Tr: 24 ug/mL (ref 15–20)

## 2016-06-02 MED ORDER — SODIUM CHLORIDE 0.9 % IV SOLN
Freq: Once | INTRAVENOUS | Status: AC
Start: 2016-06-02 — End: 2016-06-03
  Administered 2016-06-02: 11:00:00 via INTRAVENOUS

## 2016-06-02 MED ORDER — MAGNESIUM SULFATE 4 GM/100ML IV SOLN
4.0000 g | Freq: Once | INTRAVENOUS | Status: AC
  Administered 2016-06-02: 4 g via INTRAVENOUS
  Filled 2016-06-02: qty 100

## 2016-06-02 MED ORDER — DEXTROSE 5 % IV SOLN
2.0000 g | Freq: Two times a day (BID) | INTRAVENOUS | Status: DC
  Administered 2016-06-02 – 2016-06-04 (×5): 2 g via INTRAVENOUS
  Filled 2016-06-02 (×6): qty 2

## 2016-06-02 MED ORDER — SODIUM CHLORIDE 0.9% FLUSH
10.0000 mL | INTRAVENOUS | Status: DC | PRN
  Administered 2016-06-04 (×2): 10 mL
  Filled 2016-06-02 (×2): qty 40

## 2016-06-02 MED ORDER — POTASSIUM CHLORIDE CRYS ER 20 MEQ PO TBCR
40.0000 meq | EXTENDED_RELEASE_TABLET | Freq: Four times a day (QID) | ORAL | Status: AC
  Administered 2016-06-02 (×2): 40 meq via ORAL
  Filled 2016-06-02 (×2): qty 2
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
## 2016-06-02 NOTE — Progress Notes (Signed)
CRITICAL VALUE ALERT  Critical value received: Vanc Trough 24  Date of notification:  06/02/16   Time of notification:  2245  Critical value read back:Yes.    Nurse who received alert:  Mena Pauls, RN  MD notified (1st page): Donnal Debar  Time of first page:  2259  MD notified (2nd page):   Time of second page:  Responding MD:  Donnal Debar  Time MD responded:  2300

## 2016-06-02 NOTE — Progress Notes (Signed)
Pharmacy Antibiotic Note  [name] is a 50 y.o. male admitted on 05/26/2016 with  osteomyelitis .  Pharmacy has been consulted for Vancomycin dosing.  Vancomycin trough was high at 51, prior doses charted correctly.  In patients with paraplegia, CrCl calculations usually show renal function better than actual due to lower SCr due to reduced muscle mass in lower body.    Plan: D/C vancomycin dose Recheck vancomycin level at 2200 9/1 Empirically dose reduce cefepime to 2gm iv q12hr  Height: 6' (182.9 cm) Weight: 268 lb 12.8 oz (121.9 kg) IBW/kg (Calculated) : 77.6  Temp (24hrs), Avg:98.4 F (36.9 C), Min:98.1 F (36.7 C), Max:98.9 F (37.2 C)   Recent Labs Lab 05/26/16 1801 05/26/16 1839 05/26/16 2237  05/28/16 0525 05/28/16 1742 05/29/16 0530 05/29/16 0537 05/30/16 0842 05/31/16 0551 06/01/16 0931 06/01/16 2150  WBC 26.7*  --   --   < > 17.3*  --  16.3*  --  18.1* 17.6* 17.1*  --   CREATININE 5.00*  --   --   < > 3.23* 2.58*  --  1.99* 1.01 1.18 1.05  --   LATICACIDVEN 1.5 2.12* 1.4  --   --   --   --   --   --   --   --   --   VANCOTROUGH  --   --   --   --   --   --   --   --   --   --   --  51*  < > = values in this interval not displayed.  Estimated Creatinine Clearance: 110.9 mL/min (by C-G formula based on SCr of 1.05 mg/dL).    Allergies  Allergen Reactions  . No Known Allergies     Antimicrobials this admission: Rocephin 8/25>>8/31 Vancomycin 8/25>>8/27>> resumed 8/28>> Cefepime 8/31 >>  Dose adjustments this admission: 8/31 2130 vanc trough = 51--d/c vancomycin dose--recheck level  9/1 2200 vanc trough________  Microbiology results: 8/25UCx: >100K ecoli 8/25 BCx x2:   Thank you for allowing pharmacy to be a part of this patient's care.  Nani Skillern Crowford 06/02/2016 4:33 AM

## 2016-06-02 NOTE — Progress Notes (Signed)
Peripherally Inserted Central Catheter/Midline Placement  The IV Nurse has discussed with the patient and/or persons authorized to consent for the patient, the purpose of this procedure and the potential benefits and risks involved with this procedure.  The benefits include less needle sticks, lab draws from the catheter and patient may be discharged home with the catheter.  Risks include, but not limited to, infection, bleeding, blood clot (thrombus formation), and puncture of an artery; nerve damage and irregular heat beat.  Alternatives to this procedure were also discussed.  PICC/Midline Placement Documentation        Henderson [place] 06/02/2016, 11:01 AM

## 2016-06-02 NOTE — Progress Notes (Signed)
3 Days Post-Op  Subjective: Dressings taken down and seen and evaluated with Dr. gross. The hydrotherapy team is also present. Plan ago with hydrotherapy. The sacral wound looks fairly good, infection is down to the bone. The 2 ischial sites are also fairly deep. We plan to continue hydrotherapy to all sites with daily packings. His ostomy has fecal content today so we will advance his diet. Nutrition is already seen him and providing supplements to modify his malnutrition.  Objective: Vital signs in last 24 hours: Temp:  [98 F (36.7 C)-98.9 F (37.2 C)] 98 F (36.7 C) (09/01 0616) Pulse Rate:  [69-77] 71 (09/01 0616) Resp:  [16] 16 (09/01 0616) BP: (127-149)/(68-80) 127/80 (09/01 0616) SpO2:  [100 %] 100 % (09/01 0616) Last BM Date: 06/01/16  Intake/Output from previous day: 08/31 0701 - 09/01 0700 In: 6619.2 [P.O.:2000; I.V.:2124.2; Blood:345; IV Piggyback:250] Out: 750 [Stool:750] Intake/Output this shift: No intake/output data recorded.  General appearance: alert, cooperative and no distress GI: Positive bowel sounds times tolerating full liquids. Stool is in the ostomy today.. Skin: The open sites are all fairly clean, there is some necrotic stuff at the base. Also some concern with part of the skin flap that remains after debridement of the sacral ulcer. Ischial sites are clean. We will continue packing these sites. Lab Results:   Recent Labs  06/01/16 0931 06/02/16 0453  WBC 17.1* 16.9*  HGB 7.4* 7.6*  HCT 22.2* 23.7*  PLT 337 315    BMET  Recent Labs  06/01/16 0931 06/02/16 0453  NA 139 139  K 3.5 3.3*  CL 117* 116*  CO2 18* 16*  GLUCOSE 111* 89  BUN 13 11  CREATININE 1.05 0.91  CALCIUM 7.1* 7.1*   PT/INR No results for input(s): LABPROT, INR in the last 72 hours.   Recent Labs Lab 05/26/16 1801 05/27/16 0511 05/31/16 0551  AST 45* 37 16  ALT 34 26 12*  ALKPHOS 105 90 66  BILITOT 1.1 0.7 0.4  PROT 8.9* 7.1 5.8*  ALBUMIN 2.6* 2.0* 1.5*      Lipase  No results found for: LIPASE   Studies/Results: No results found.  Medications: . ceFEPime (MAXIPIME) IV  2 g Intravenous Q12H  . collagenase   Topical Daily  . feeding supplement (ENSURE ENLIVE)  237 mL Oral BID BM  . feeding supplement (PRO-STAT SUGAR FREE 64)  30 mL Oral TID  . heparin subcutaneous  5,000 Units Subcutaneous Q12H  . lip balm  1 application Topical BID  . metroNIDAZOLE  500 mg Oral Q8H  . mupirocin ointment  1 application Nasal BID  . pantoprazole  40 mg Oral Daily  . phosphorus  500 mg Oral BID  . potassium chloride  40 mEq Oral Q6H  . pregabalin  50 mg Oral BID  . psyllium  1 packet Oral BID  . sodium chloride flush  3 mL Intravenous Q12H  . vitamin C  500 mg Oral BID   . 0.9 % NaCl with KCl 40 mEq / L 100 mL/hr (06/01/16 1538)    Assessment/Plan Sacral and ischial decubitus with gangrene S/pfecal disimpaction, debridement of sacral decubitus ulcer including bone, periosteum, muscle fat and skin; laparoscopic diverting end colostomy, 05/30/16 Dr. Michael Boston Fall from chair, on floor 5 days Acute kidney injury - improving Hypokalemia, hypomagnesemia - K+ is up and replace with more Mag today + K+ in IV fluids Bleeding from site post op - anemia H/H 8/24 Paraplegia since 1981 Anemia - transfused 1  unit 06/01/16 Malnutrition - prealbumin 3.1 05/31/16  -nutrition seeing on 06/01/16 to help with this issue FEN: Full liquids/IV fluids - advance to soft diet ID: Flagyl/Neomycin bowel prep,Cefotetan pre op;Day 6Rocephin Vancomycin IV day 4 ID recommendations 06/01/16: Vancomycin 6 weeks, cefepime x 6 weeks and Flagyl 500 mg PO TID  DVT: Heparin    Plan: Advance diet, daily dressings to the sacral and ischial wounds. Continued hydrotherapy. Nutritional supplements per dietitian's recommendation. He will need transfer to a skilled nursing facility, and a [place] for follow-up at discharge also. We will follow peripherally while he remains  in the hospital. We will not follow as an outpatient.  LOS: 7 days    [name] 06/02/2016 [phone]

## 2016-06-02 NOTE — Progress Notes (Signed)
PT HYDROTHERAPY EVALUATION AND TREATMENT   06/02/16 1300  Subjective Assessment  Subjective I didn't get to sleep   Patient and Family Stated Goals agreed to wound care.  Date of Onset (present  on adm)  Prior Treatments surgerical deb  Evaluation and Treatment  Evaluation and Treatment Procedures Explained to Patient/Family Yes  Evaluation and Treatment Procedures agreed to  Pressure Ulcer 06/02/16 Stage IV - Full thickness tissue loss with exposed bone, tendon or muscle. PT: #1sacral wound; #2 R IT; #1 L IT; #4 center wound  Date First Assessed/Time First Assessed: 06/02/16 1324   Location: (c) Sacrum  Staging: Stage IV - Full thickness tissue loss with exposed bone, tendon or muscle.  Wound Description (Comments): PT: #1sacral wound; #2 R IT; #1 L IT; #4 center wound  Pr...  Dressing Type ABD;Moist to dry;Non adherent;Other (Comment);Tape dressing;Gauze (Comment) (santyl/enzymatic deb)  Dressing Changed  Dressing Change Frequency Other (Comment) (6x)  State of Healing Other (Comment) (partial gran, eschar, non-healing)  Site / Wound Assessment Bleeding;Granulation tissue;Pale;Red;Yellow;Brown  % Wound base Red or Granulating 65% (sacral wound #1)  % Wound base Yellow 35%  Peri-wound Assessment Denuded;Other (Comment) (black/brown area at 1:00 to 3:00 with potential for necrosis)  Wound Length (cm) 11 cm  Wound Width (cm) 4 cm  Wound Depth (cm) 3.9 cm  Drainage Amount Copious  Drainage Description Purulent;Odor   Other wounds: # 2 R hip wound  2.9 x 5.0 x 8.5 )fully granulating but unable to visualize wound base)  #3 L hip wound 2.0x 2.5 x 8.5 (appears mostly granulating but MD expressed concern over infection with questionable  Exudate from this wound)  #4 center wound 1.5 x 1.0 x 3.0 (shallow, 90% granulation/10% slough)      Treatment Cleansed;Hydrotherapy (Pulse lavage);Packing (Saline gauze)  Hydrotherapy  Pulsed lavage therapy - wound location sacral  Pulsed  Lavage with Suction (psi) 8 psi (to 12)  Pulsed Lavage with Suction - Normal Saline Used 1000 mL  Pulsed Lavage Tip Tip with splash shield  Wound Therapy - Assess/Plan/Recommendations  Wound Therapy - Clinical Statement The patient  has a history of paraplegia(unknown time). He  The patient  has a large malodorous sacral wound, bil IT wounds that tunnel to hip joints. He was admitted to the hospital after having been found on floor after about 5 days. Noted in [place] indicates he was evaluated by plastic surgery at [facility] and scheduled for I and D and closure on 05/18/16. Apparently did not get there for surgery. The patient  will benefit from PT for wound care--cleansing, debridement and education.  Wound Therapy - Functional Problem List paraplegia, lives alone  Factors Delaying/Impairing Wound Healing Altered sensation;Immobility;Tobacco use;Substance abuse  Hydrotherapy Plan Debridement;Dressing change;Patient/family education;Pulsatile lavage with suction  Wound Therapy - Frequency 6X / week  Wound Therapy - Current Recommendations Case manager/social work;Surgery consult  Wound Therapy - Follow Up Recommendations Skilled nursing facility;Other (comment) (vs LTACH)  Wound Plan see above  Wound Therapy Goals - Improve the function of patient's integumentary system by progressing the wound(s) through the phases of wound healing by:  Decrease Necrotic Tissue to 20  Decrease Necrotic Tissue - Progress Goal set today  Increase Granulation Tissue to 80 (sacral wound)  Improve Drainage Characteristics Mod  Improve Drainage Characteristics - Progress Goal set today  Patient/Family will be able to  reposition  self and perform pressure relief in bed   Patient/Family Instruction Goal - Progress Goal set today  Goals/treatment plan/discharge plan  were made with and agreed upon by patient/family Yes  Time For Goal Achievement 2 weeks  Wound Therapy - Potential for Goals Leanord Hawking, PT Pager: [phone] 06/02/2016

## 2016-06-02 NOTE — Consult Note (Signed)
[facility] Nurse ostomy follow up  Stoma type/location: LUQ end colostomy Stomal assessment/size: 2 and 1/2 inches round, red, edematous, raised Peristomal assessment: not assessed Treatment options for stomal/peristomal skin: two skin barrier rings joined together (single thickness) Output serosanguinous liquid (no stool, no flatus) Ostomy pouching: 2pc. 2 and 3/4 inch pouching system with two skin barrier rings needed to encircle stoma  Education provided: None Enrolled patient in [place] program: No Discharge disposition will be to rehab to assist with ostomy and wound care.  Patient refuses ostomy care this AM due to no sleep.  Pouch is intact.  Will perform pouch change over the weekend and patient agrees to this,  No stool noted in pouch and seal is still very secure.  [place] team will follow.  [name] RN BSN [place] Pager [phone]

## 2016-06-03 LAB — VANCOMYCIN, TROUGH: VANCOMYCIN TR: 14 ug/mL — AB (ref 15–20)

## 2016-06-03 LAB — CBC
HCT: 25.1 % — ABNORMAL LOW (ref 39.0–52.0)
HEMOGLOBIN: 8.4 g/dL — AB (ref 13.0–17.0)
MCH: 28.3 pg (ref 26.0–34.0)
MCHC: 33.5 g/dL (ref 30.0–36.0)
MCV: 84.5 fL (ref 78.0–100.0)
PLATELETS: 270 10*3/uL (ref 150–400)
RBC: 2.97 MIL/uL — ABNORMAL LOW (ref 4.22–5.81)
RDW: 15.8 % — AB (ref 11.5–15.5)
WBC: 16.1 10*3/uL — ABNORMAL HIGH (ref 4.0–10.5)

## 2016-06-03 LAB — BASIC METABOLIC PANEL
Anion gap: 5 (ref 5–15)
BUN: 13 mg/dL (ref 6–20)
CALCIUM: 6.9 mg/dL — AB (ref 8.9–10.3)
CO2: 20 mmol/L — ABNORMAL LOW (ref 22–32)
CREATININE: 0.88 mg/dL (ref 0.61–1.24)
Chloride: 115 mmol/L — ABNORMAL HIGH (ref 101–111)
GFR calc Af Amer: >60 mL/min (ref >60)
GLUCOSE: 104 mg/dL — AB (ref 65–99)
POTASSIUM: 3.7 mmol/L (ref 3.5–5.1)
SODIUM: 140 mmol/L (ref 135–145)

## 2016-06-03 LAB — PHOSPHORUS: PHOSPHORUS: 3.7 mg/dL (ref 2.5–4.6)

## 2016-06-03 MED ORDER — VANCOMYCIN HCL IN DEXTROSE 750-5 MG/150ML-% IV SOLN
750.0000 mg | Freq: Two times a day (BID) | INTRAVENOUS | Status: DC
  Administered 2016-06-03 – 2016-06-04 (×2): 750 mg via INTRAVENOUS
  Filled 2016-06-03 (×2): qty 150

## 2016-06-03 NOTE — Progress Notes (Signed)
[place] [facility] Office:  [phone] General Surgery Progress Note   LOS: 8 days  POD -  4 Days Post-Op  Assessment/Plan: 1.  INCISION AND DRAINAGE DECUBITUS ULCER, LAPAROSCOPIC DIVERTING COLOSTOMY - 05/30/2016 - Gross  For severe decubitus  Getting hydrotherapy  On Vanc, Cefepime, and Flagyl - ID recommendations 06/01/16: Vancomycin 6 weeks, cefepime x 6 weeks and Flagyl 500 mg PO TID   On regular food with ostomy functioning We will see again on Monday, 06/05/2016.   2.  Paraplegic secondary to [place] - since 1981 3.  Acute kidney injury - creatinine - 0.88 - 06/03/2016 4.  Anemia  Hgb - 8.4 - 06/03/2016 5.  Malnutrition  6.  DVT prophylaxis - SQ heparin 7.  The patient fell/found down at home - so disposition could be an issue   Principal Problem:   Rhabdomyolysis Active Problems:   Paraplegia ([place])   Hypokalemia   Acute renal failure (ARF) (HCC)   Dehydration   Chronic pain   Leukocytosis   Decubitus ulcers   Wound infection (HCC)   Anemia   Hypertension   Prolonged QT interval   Protein-calorie malnutrition, severe (HCC)   Osteomyelitis with necrosis of sacrum    Colostomy in place for fecal diversion  Subjective:  Doing okay.  On regular diet - ostomy working.  He was sleeping when I went in.  He said that he was resting for hydrotherapy - though the nurse thinks he may not get hydrotherapy over the weekend (I don't know if that includes Monday)  Objective:   Vitals:   06/02/16 2054 06/03/16 0524  BP: (!) 151/74 (!) 160/86  Pulse: 84 79  Resp: 17 18  Temp: 98.9 F (37.2 C) 98.6 F (37 C)     Intake/Output from previous day:  09/01 0701 - 09/02 0700 In: 4229.2 [P.O.:2760; I.V.:941.7; Blood:377.5; IV Piggyback:150] Out: 4250 [Urine:3750; Stool:500]  Intake/Output this shift:  No intake/output data recorded.   Physical Exam:   General: Obese AA who is alert and oriented.    HEENT: Normal. Pupils equal. .   Lungs: Clear.   Abdomen: Soft.  BS  present.   Wound: Incisions clean.  Ostomy LLQ.   Lab Results:    Recent Labs  06/02/16 0453 06/03/16 0440  WBC 16.9* 16.1*  HGB 7.6* 8.4*  HCT 23.7* 25.1*  PLT 315 270    BMET   Recent Labs  06/02/16 0453 06/03/16 0440  NA 139 140  K 3.3* 3.7  CL 116* 115*  CO2 16* 20*  GLUCOSE 89 104*  BUN 11 13  CREATININE 0.91 0.88  CALCIUM 7.1* 6.9*    PT/INR  No results for input(s): LABPROT, INR in the last 72 hours.  ABG  No results for input(s): PHART, HCO3 in the last 72 hours.  Invalid input(s): PCO2, PO2   Studies/Results:  No results found.   Anti-infectives:   Anti-infectives    Start     Dose/Rate Route Frequency Ordered Stop   06/02/16 0600  ceFEPIme (MAXIPIME) 2 g in dextrose 5 % 50 mL IVPB     2 g 100 mL/hr over 30 Minutes Intravenous Every 12 hours 06/02/16 0427     06/01/16 1800  ceFEPIme (MAXIPIME) 2 g in dextrose 5 % 50 mL IVPB  Status:  Discontinued     2 g 100 mL/hr over 30 Minutes Intravenous Every 8 hours 06/01/16 1555 06/02/16 0427   06/01/16 1700  metroNIDAZOLE (FLAGYL) tablet 500 mg     500  mg Oral Every 8 hours 06/01/16 1556     05/30/16 2200  vancomycin (VANCOCIN) IVPB 1000 mg/200 mL premix  Status:  Discontinued     1,000 mg 200 mL/hr over 60 Minutes Intravenous Every 12 hours 05/30/16 1021 06/01/16 2247   05/30/16 0600  cefoTEtan (CEFOTAN) 2 g in dextrose 5 % 50 mL IVPB  Status:  Discontinued     2 g 100 mL/hr over 30 Minutes Intravenous On call to O.R. 05/29/16 1545 05/29/16 1639   05/30/16 0600  cefoTEtan (CEFOTAN) 2 g in dextrose 5 % 50 mL IVPB     2 g 100 mL/hr over 30 Minutes Intravenous On call to O.R. 05/29/16 1639 05/30/16 1545   05/29/16 1700  neomycin (MYCIFRADIN) tablet 1,000 mg     1,000 mg Oral 3 times per day 05/29/16 1639 05/29/16 2252   05/29/16 1700  metroNIDAZOLE (FLAGYL) tablet 1,000 mg     1,000 mg Oral 3 times per day 05/29/16 1639 05/29/16 2252   05/29/16 0900  vancomycin (VANCOCIN) 1,500 mg in sodium chloride 0.9 %  500 mL IVPB  Status:  Discontinued     1,500 mg 250 mL/hr over 120 Minutes Intravenous Every 24 hours 05/29/16 0824 05/30/16 1021   05/28/16 2200  vancomycin (VANCOCIN) 1,750 mg in sodium chloride 0.9 % 500 mL IVPB  Status:  Discontinued     1,750 mg 250 mL/hr over 120 Minutes Intravenous Every 48 hours 05/27/16 1022 05/28/16 1235   05/26/16 2130  vancomycin (VANCOCIN) 2,000 mg in sodium chloride 0.9 % 500 mL IVPB     2,000 mg 250 mL/hr over 120 Minutes Intravenous  Once 05/26/16 2102 05/26/16 2323   05/26/16 2000  cefTRIAXone (ROCEPHIN) 2 g in dextrose 5 % 50 mL IVPB  Status:  Discontinued     2 g 100 mL/hr over 30 Minutes Intravenous Every 24 hours 05/26/16 1938 06/01/16 1554      Alphonsa Overall, MD, FACS Pager: [place] [facility] Office: [phone] 06/03/2016

## 2016-06-03 NOTE — Clinical Social Work Note (Signed)
Per surgeon note- will follow up with patient on Monday 06/05/16.  SNF bed is available at [facility] when medically stable per MD.  SW services to follow for d/c needs.  Lorie Phenix. Pauline Good, [place]
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
## 2016-06-03 NOTE — Progress Notes (Signed)
PT--HYDROTHERAPY TREATMENT NOTE  06/03/16 1300  Subjective Assessment  Subjective I'm good  Patient and Family Stated Goals agreed to wound care.  Prior Treatments surgical deb  Evaluation and Treatment  Evaluation and Treatment Procedures Explained to Patient/Family Yes  Evaluation and Treatment Procedures agreed to  Pressure Ulcer 06/02/16 Stage IV - Full thickness tissue loss with exposed bone, tendon or muscle. PT: #1sacral wound; #2 R IT; #1 L IT; #4 center wound  Date First Assessed/Time First Assessed: 06/02/16 1324   Location: (c) Sacrum  Staging: Stage IV - Full thickness tissue loss with exposed bone, tendon or muscle.  Wound Description (Comments): PT: #1sacral wound; #2 R IT; #1 L IT; #4 center wound  Pr...  Dressing Type ABD;Moist to dry;Non adherent;Other (Comment);Tape dressing;Gauze (Comment) (santyl/collagenase)  Dressing Changed  Dressing Change Frequency Other (Comment) (PT 6x/wk)  State of Healing Other (Comment) (partial gran, eschar, non healing)  Site / Wound Assessment Bleeding;Granulation tissue;Pale;Red;Yellow;Brown  % Wound base Red or Granulating 65%  % Wound base Yellow 35%  Peri-wound Assessment Denuded;Other (Comment) ((black/brown area at 1:00 to 3:00 with potential for necrosi)  Wound Length (cm) (refer to eval)  Drainage Amount Copious  Drainage Description Purulent;Odor  Treatment Cleansed;Hydrotherapy (Pulse lavage);Packing (Saline gauze)  Hydrotherapy  Pulsed lavage therapy - wound location sacral, IT wounds  Pulsed Lavage with Suction (psi) 8 psi  Pulsed Lavage with Suction - Normal Saline Used 1000 mL  Pulsed Lavage Tip Tip with splash shield  Wound Therapy - Assess/Plan/Recommendations  Wound Therapy - Clinical Statement The patient  has a history of paraplegia(unknown time). He  The patient  has a large malodorous sacral wound, bil IT wounds that tunnel to hip joints. He was admitted to the hospital after having been found on floor after about  5 days. Noted in [place] indicates he was evaluated by plastic surgery at [facility] and scheduled for I and D and closure on 05/18/16. Apparently did not get there for surgery. The patient  will benefit from PT for wound care--cleansing, debridement and education.  Wound Therapy - Functional Problem List paraplegia, lives alone  Factors Delaying/Impairing Wound Healing Altered sensation;Immobility;Tobacco use;Substance abuse  Hydrotherapy Plan Debridement;Dressing change;Patient/family education;Pulsatile lavage with suction  Wound Therapy - Frequency 6X / week  Wound Therapy - Current Recommendations Case manager/social work;Surgery consult  Wound Therapy - Follow Up Recommendations Skilled nursing facility;Other (comment) (vs LTACH)  Wound Plan see above  Wound improving, less purulent exudate, less foul odor today;  Wound Therapy Goals - Improve the function of patient's integumentary system by progressing the wound(s) through the phases of wound healing by:  Decrease Necrotic Tissue to 20  Decrease Necrotic Tissue - Progress Progressing toward goal  Increase Granulation Tissue to 80  Increase Granulation Tissue - Progress Progressing toward goal  Patient/Family will be able to  reposition  self and perform pressure relief in bed   Patient/Family Instruction Goal - Progress Progressing toward goal  Time For Goal Achievement 2 weeks  Wound Therapy - Potential for Goals Leanord Hawking, PT Pager: [phone] 06/03/2016

## 2016-06-03 NOTE — Progress Notes (Signed)
Pharmacy Antibiotic Note  [name] is a 50 y.o. male admitted on 05/26/2016 with Osteomyelitis.  Pharmacy has been consulted for Vancomycin dosing.  Vancomycin random ~24hr after level of 51, was 24hr.  Still above goal.  SCr improved again.  Plan: Vanc level now in acceptable trough range. Resume Vanc at 750mg  IV q12h and recheck trough after 3rd dose.   Height: 6' (182.9 cm) Weight: 268 lb 12.8 oz (121.9 kg) IBW/kg (Calculated) : 77.6  Temp (24hrs), Avg:98.5 F (36.9 C), Min:97.8 F (36.6 C), Max:98.9 F (37.2 C)   Recent Labs Lab 05/30/16 0842 05/31/16 0551 06/01/16 0931  06/02/16 0453 06/02/16 2200 06/03/16 0440 06/03/16 1855  WBC 18.1* 17.6* 17.1*  --  16.9*  --  16.1*  --   CREATININE 1.01 1.18 1.05  --  0.91  --  0.88  --   VANCOTROUGH  --   --   --   < >  --  24*  --  14*  < > = values in this interval not displayed.  Estimated Creatinine Clearance: 132.4 mL/min (by C-G formula based on SCr of 0.88 mg/dL).    Allergies  Allergen Reactions  . No Known Allergies     Antimicrobials this admission: Rocephin 8/25 >>  Vancomycin 8/25 >> 8/27( MD accidentally d/ced)>> resumed 8/28 >> Flagyl PO 8/31 >>   Dose adjustments this admission: 8/25: 2gm x1, then 1750 q48 8/28: resumed back as 1500 mg q24h for improving renal funct 8/29: changed vanc dose to 1gm q12h 8/31 VT @  2150= 51 on 1g IV q12h (rcvd 6500mg  over 48h period)- HOLD vanc 9/1 VT @ 2200 = 24, continue to HOLD vanc 9/2 VT @ 1900 = 14, K~0.03, T1/2~23.   Microbiology results: 8/25 UCx: >100K ecoli (R=cipro, ampi; I= unasyn) 8/25 BCx x2: NGTD 8/28 MRSA PCR (+) 8/29 MRSA PCR Negative / Staph PCR Negative  Thank you for allowing pharmacy to be a part of this patient's care.  Romeo Rabon, PharmD, pager [phone]. 06/03/2016,8:47 PM.

## 2016-06-03 NOTE — Progress Notes (Signed)
Pharmacy Antibiotic Note  [name] is a 50 y.o. male admitted on 05/26/2016 with Osteomyelitis.  Pharmacy has been consulted for Vancomycin dosing.  Vancomycin random ~24hr after level of 51, was 24hr.  Still above goal.  SCr improved again.  Plan: Repeat vancomycin level at 1800 9/2  Height: 6' (182.9 cm) Weight: 268 lb 12.8 oz (121.9 kg) IBW/kg (Calculated) : 77.6  Temp (24hrs), Avg:98.4 F (36.9 C), Min:97.6 F (36.4 C), Max:99.1 F (37.3 C)   Recent Labs Lab 05/30/16 0842 05/31/16 0551 06/01/16 0931 06/01/16 2150 06/02/16 0453 06/02/16 2200 06/03/16 0440  WBC 18.1* 17.6* 17.1*  --  16.9*  --  16.1*  CREATININE 1.01 1.18 1.05  --  0.91  --  0.88  VANCOTROUGH  --   --   --  51*  --  24*  --     Estimated Creatinine Clearance: 132.4 mL/min (by C-G formula based on SCr of 0.88 mg/dL).    Allergies  Allergen Reactions  . No Known Allergies     Antimicrobials this admission: Rocephin 8/25>>8/31 Vancomycin 8/25>>8/27>>resumed 8/28>> Cefepime 8/31 >>  Dose adjustments this admission: 8/31 2130 vanc trough = 51--d/c vancomycin dose  9/1 2200 vanc trough___24_____ 9/2 1800 vanc random _______  Microbiology results: 8/25UCx: >100K ecoli 8/25 BCx x2:   Thank you for allowing pharmacy to be a part of this patient's care.  Nani Skillern Crowford 06/03/2016 5:30 AM

## 2016-06-04 ENCOUNTER — Encounter (HOSPITAL_COMMUNITY): Payer: Self-pay | Admitting: *Deleted

## 2016-06-04 ENCOUNTER — Emergency Department (HOSPITAL_COMMUNITY)
Admission: EM | Admit: 2016-06-04 | Discharge: 2016-06-05 | Disposition: A | Discharge status: Home / Self Care | Payer: Medicare Other | Attending: Emergency Medicine | Admitting: Emergency Medicine

## 2016-06-04 DIAGNOSIS — Z751 Person awaiting admission to adequate facility elsewhere: Secondary | ICD-10-CM

## 2016-06-04 DIAGNOSIS — I10 Essential (primary) hypertension: Secondary | ICD-10-CM | POA: Insufficient documentation

## 2016-06-04 DIAGNOSIS — M6282 Rhabdomyolysis: Secondary | ICD-10-CM | POA: Insufficient documentation

## 2016-06-04 DIAGNOSIS — F17210 Nicotine dependence, cigarettes, uncomplicated: Secondary | ICD-10-CM | POA: Insufficient documentation

## 2016-06-04 LAB — BASIC METABOLIC PANEL
Anion gap: 5 (ref 5–15)
BUN: 12 mg/dL (ref 6–20)
CO2: 22 mmol/L (ref 22–32)
CREATININE: 0.73 mg/dL (ref 0.61–1.24)
Calcium: 7.2 mg/dL — ABNORMAL LOW (ref 8.9–10.3)
Chloride: 111 mmol/L (ref 101–111)
GFR calc Af Amer: >60 mL/min (ref >60)
GLUCOSE: 92 mg/dL (ref 65–99)
POTASSIUM: 3.7 mmol/L (ref 3.5–5.1)
SODIUM: 138 mmol/L (ref 135–145)

## 2016-06-04 LAB — CBC
HEMATOCRIT: 25.2 % — AB (ref 39.0–52.0)
Hemoglobin: 8.3 g/dL — ABNORMAL LOW (ref 13.0–17.0)
MCH: 27.4 pg (ref 26.0–34.0)
MCHC: 32.9 g/dL (ref 30.0–36.0)
MCV: 83.2 fL (ref 78.0–100.0)
PLATELETS: 305 10*3/uL (ref 150–400)
RBC: 3.03 MIL/uL — ABNORMAL LOW (ref 4.22–5.81)
RDW: 15.9 % — AB (ref 11.5–15.5)
WBC: 17 10*3/uL — AB (ref 4.0–10.5)

## 2016-06-04 LAB — PHOSPHORUS: Phosphorus: 3.7 mg/dL (ref 2.5–4.6)

## 2016-06-04 MED ORDER — METRONIDAZOLE 500 MG PO TABS
500.0000 mg | ORAL_TABLET | Freq: Three times a day (TID) | ORAL | Status: DC
  Administered 2016-06-04 – 2016-06-05 (×2): 500 mg via ORAL
  Filled 2016-06-04 (×2): qty 1

## 2016-06-04 MED ORDER — OXYCODONE-ACETAMINOPHEN 7.5-325 MG PO TABS: 1.0000 | ORAL_TABLET | Freq: Four times a day (QID) | ORAL | 0 refills | Status: DC | PRN

## 2016-06-04 MED ORDER — AMLODIPINE BESY-BENAZEPRIL HCL 10-20 MG PO CAPS: 1.0000 | ORAL_CAPSULE | Freq: Every day | ORAL | Status: DC

## 2016-06-04 MED ORDER — AMLODIPINE BESYLATE 10 MG PO TABS
10.0000 mg | ORAL_TABLET | Freq: Every day | ORAL | Status: DC
  Administered 2016-06-05: 10 mg via ORAL
  Filled 2016-06-04: qty 1

## 2016-06-04 MED ORDER — PRO-STAT SUGAR FREE PO LIQD: 30.0000 mL | Freq: Three times a day (TID) | ORAL | 0 refills | Status: DC

## 2016-06-04 MED ORDER — TRAMADOL HCL 50 MG PO TABS
50.0000 mg | ORAL_TABLET | Freq: Four times a day (QID) | ORAL | Status: DC | PRN
  Administered 2016-06-04 – 2016-06-05 (×3): 50 mg via ORAL
  Filled 2016-06-04 (×3): qty 1

## 2016-06-04 MED ORDER — PREGABALIN 50 MG PO CAPS
50.0000 mg | ORAL_CAPSULE | Freq: Two times a day (BID) | ORAL | Status: DC
  Administered 2016-06-04 – 2016-06-05 (×2): 50 mg via ORAL
  Filled 2016-06-04 (×2): qty 1

## 2016-06-04 MED ORDER — PSYLLIUM 95 % PO PACK
1.0000 | PACK | Freq: Two times a day (BID) | ORAL | Status: DC
  Administered 2016-06-05: 1 via ORAL
  Filled 2016-06-04 (×3): qty 1

## 2016-06-04 MED ORDER — VANCOMYCIN HCL 1000 MG IV SOLR: 1250.0000 mg | INTRAVENOUS | Status: DC

## 2016-06-04 MED ORDER — COLLAGENASE 250 UNIT/GM EX OINT: TOPICAL_OINTMENT | Freq: Every day | CUTANEOUS | 0 refills | Status: DC

## 2016-06-04 MED ORDER — BENAZEPRIL HCL 20 MG PO TABS
20.0000 mg | ORAL_TABLET | Freq: Every day | ORAL | Status: DC
  Administered 2016-06-05: 20 mg via ORAL
  Filled 2016-06-04: qty 1

## 2016-06-04 MED ORDER — K PHOS MONO-SOD PHOS DI & MONO 155-852-130 MG PO TABS: 500.0000 mg | ORAL_TABLET | Freq: Two times a day (BID) | ORAL | 0 refills | Status: AC

## 2016-06-04 MED ORDER — K PHOS MONO-SOD PHOS DI & MONO 155-852-130 MG PO TABS
500.0000 mg | ORAL_TABLET | Freq: Two times a day (BID) | ORAL | Status: DC
  Administered 2016-06-04 – 2016-06-05 (×2): 500 mg via ORAL
  Filled 2016-06-04 (×3): qty 2

## 2016-06-04 MED ORDER — HEPARIN SOD (PORK) LOCK FLUSH 100 UNIT/ML IV SOLN
250.0000 [IU] | INTRAVENOUS | Status: AC | PRN
  Administered 2016-06-04: 250 [IU]

## 2016-06-04 MED ORDER — COLLAGENASE 250 UNIT/GM EX OINT
TOPICAL_OINTMENT | Freq: Every day | CUTANEOUS | Status: DC
  Administered 2016-06-05: 1 via TOPICAL
  Filled 2016-06-04: qty 30

## 2016-06-04 MED ORDER — FERROUS SULFATE 325 (65 FE) MG PO TABS: 325.0000 mg | ORAL_TABLET | Freq: Two times a day (BID) | ORAL | 3 refills | Status: DC

## 2016-06-04 MED ORDER — VITAMIN C 500 MG PO TABS
500.0000 mg | ORAL_TABLET | Freq: Two times a day (BID) | ORAL | Status: DC
  Administered 2016-06-04 – 2016-06-05 (×2): 500 mg via ORAL
  Filled 2016-06-04 (×3): qty 1

## 2016-06-04 MED ORDER — MAGIC MOUTHWASH
15.0000 mL | Freq: Four times a day (QID) | ORAL | Status: DC | PRN
  Administered 2016-06-05: 15 mL via ORAL
  Filled 2016-06-04: qty 15

## 2016-06-04 MED ORDER — PREGABALIN 50 MG PO CAPS: 50.0000 mg | ORAL_CAPSULE | Freq: Two times a day (BID) | ORAL | Status: DC

## 2016-06-04 MED ORDER — PANTOPRAZOLE SODIUM 40 MG PO TBEC
40.0000 mg | DELAYED_RELEASE_TABLET | Freq: Every day | ORAL | Status: DC
  Administered 2016-06-05: 40 mg via ORAL
  Filled 2016-06-04: qty 1

## 2016-06-04 MED ORDER — OXYCODONE-ACETAMINOPHEN 7.5-325 MG PO TABS
1.0000 | ORAL_TABLET | Freq: Four times a day (QID) | ORAL | Status: DC | PRN
Start: 2016-06-04 — End: 2016-06-05
  Administered 2016-06-04 – 2016-06-05 (×3): 1 via ORAL
  Filled 2016-06-04 (×3): qty 1

## 2016-06-04 MED ORDER — FAMOTIDINE 20 MG PO TABS
20.0000 mg | ORAL_TABLET | Freq: Every day | ORAL | Status: DC
  Administered 2016-06-04 – 2016-06-05 (×2): 20 mg via ORAL
  Filled 2016-06-04 (×2): qty 1

## 2016-06-04 MED ORDER — VANCOMYCIN HCL 1000 MG IV SOLR
1250.0000 mg | INTRAVENOUS | Status: DC
  Administered 2016-06-05: 1250 mg via INTRAVENOUS
  Filled 2016-06-04: qty 1250

## 2016-06-04 MED ORDER — DEXTROSE 5 % IV SOLN: 2.0000 g | Freq: Two times a day (BID) | INTRAVENOUS | Status: DC

## 2016-06-04 MED ORDER — PANTOPRAZOLE SODIUM 40 MG PO TBEC: 40.0000 mg | DELAYED_RELEASE_TABLET | Freq: Every day | ORAL | Status: DC

## 2016-06-04 MED ORDER — DEXTROSE 5 % IV SOLN
2.0000 g | Freq: Two times a day (BID) | INTRAVENOUS | Status: DC
  Administered 2016-06-04 – 2016-06-05 (×2): 2 g via INTRAVENOUS
  Filled 2016-06-04 (×3): qty 2

## 2016-06-04 MED ORDER — ENSURE ENLIVE PO LIQD: 237.0000 mL | Freq: Two times a day (BID) | ORAL | 12 refills | Status: DC

## 2016-06-04 MED ORDER — METRONIDAZOLE 500 MG PO TABS: 500.0000 mg | ORAL_TABLET | Freq: Three times a day (TID) | ORAL | Status: AC

## 2016-06-04 MED ORDER — PSYLLIUM 95 % PO PACK: 1.0000 | PACK | Freq: Two times a day (BID) | ORAL | Status: DC

## 2016-06-04 MED ORDER — ASCORBIC ACID 500 MG PO TABS
500.0000 mg | ORAL_TABLET | Freq: Two times a day (BID) | ORAL | Status: DC
Start: 2016-06-04 — End: 2016-07-22

## 2016-06-04 MED ORDER — FERROUS SULFATE 325 (65 FE) MG PO TABS
325.0000 mg | ORAL_TABLET | Freq: Two times a day (BID) | ORAL | Status: DC
  Administered 2016-06-04 – 2016-06-05 (×2): 325 mg via ORAL
  Filled 2016-06-04 (×4): qty 1

## 2016-06-04 MED ORDER — MAGIC MOUTHWASH: 15.0000 mL | Freq: Four times a day (QID) | ORAL | 0 refills | Status: DC | PRN

## 2016-06-04 MED ORDER — ENSURE ENLIVE PO LIQD
237.0000 mL | Freq: Two times a day (BID) | ORAL | Status: DC
  Filled 2016-06-04 (×2): qty 237

## 2016-06-04 MED ORDER — TRAMADOL HCL 50 MG PO TABS: 50.0000 mg | ORAL_TABLET | Freq: Four times a day (QID) | ORAL | 0 refills | Status: DC | PRN

## 2016-06-04 MED ORDER — ALUM & MAG HYDROXIDE-SIMETH 200-200-20 MG/5ML PO SUSP: 30.0000 mL | Freq: Four times a day (QID) | ORAL | Status: DC | PRN

## 2016-06-04 MED ORDER — PRO-STAT SUGAR FREE PO LIQD
30.0000 mL | Freq: Three times a day (TID) | ORAL | Status: DC
  Administered 2016-06-05: 09:00:00 via ORAL
  Administered 2016-06-05: 30 mL via ORAL
  Filled 2016-06-04 (×5): qty 30

## 2016-06-04 MED ORDER — ALUM & MAG HYDROXIDE-SIMETH 200-200-20 MG/5ML PO SUSP: 30.0000 mL | Freq: Four times a day (QID) | ORAL | 0 refills | Status: DC | PRN

## 2016-06-04 NOTE — Progress Notes (Signed)
Pharmacy Antibiotic Note  [name] is a 50 y.o. male admitted on 06/04/2016 with sacral / Ischial osteomyelitis, UTI, sacral decubitus ulcer (necrotic). S/p I&D of sacral wound on 8/29 (including bone, periosteum, muscle fat and skin) + colostomy.  Pharmacy dosing Vancomycin.  Pt with supratherapeutic levels and dose held since 8/31 AM, restarted 9/2 PM.    Plan: Vancomycin resumed 9/2 at 750mg  IV q12h.  With T1/2 ~27 hours, predict q24h dosing may be better therefore change to Vancomycin 1250mg  q24h (1st dose 9/4).  Discussed with MD as pt was supposed to [facility] 9/3 but discharge delayed until 9/4.  MD to order VT and SCr to be checked after 3rd dose of this regimen.    Continue flagyl and cefepime at current doses.        Temp (24hrs), Avg:98 F (36.7 C), Min:97.8 F (36.6 C), Max:98.2 F (36.8 C)   Recent Labs Lab 05/31/16 0551 06/01/16 0931  06/02/16 0453 06/02/16 2200 06/03/16 0440 06/03/16 1855 06/04/16 0500  WBC 17.6* 17.1*  --  16.9*  --  16.1*  --  17.0*  CREATININE 1.18 1.05  --  0.91  --  0.88  --  0.73  VANCOTROUGH  --   --   < >  --  24*  --  14*  --   < > = values in this interval not displayed.  Estimated Creatinine Clearance: 145.6 mL/min (by C-G formula based on SCr of 0.8 mg/dL).    Allergies  Allergen Reactions  . No Known Allergies     Antimicrobials this admission: Rocephin 8/25 >>  Vancomycin 8/25 >> 8/27( MD accidentally d/ced)>> resumed 8/28 >> Flagyl PO 8/31 >>   Dose adjustments this admission: 8/25: 2gm x1, then 1750 q48 8/28: resumed back as 1500 mg q24h for improving renal funct 8/29: changed vanc dose to 1gm q12h 8/31 VT @  2150= 51 on 1g IV q12h (rcvd 6500mg  over 48h period)- HOLD vanc 9/1 VT @ 2200 = 24, continue to HOLD vanc 9/2 VT @ 1900 = 14, K~0.0257, T1/2 ~27 hours, resumed vanc 750mg  q12h   Microbiology results: 8/25 UCx: >100K ecoli (R=cipro, ampi; I= unasyn) 8/25 BCx x2: NGTD 8/28 MRSA PCR (+) 8/29 MRSA PCR  Negative / Staph PCR Negative  Thank you for allowing pharmacy to be a part of this patient's care.  Ralene Bathe, PharmD, BCPS 06/04/2016, 4:36 PM  Pager: [phone]

## 2016-06-04 NOTE — ED Triage Notes (Signed)
Pt discharged from [facility], enroute to [employer] [facility] with [place], they were notified to return pt to hospital due to miscommunication. Pt returned to ED, after several different stories, pt has been placed in room 15 for re evaluation and placement to a facility.

## 2016-06-04 NOTE — Progress Notes (Signed)
Pt was lying in bed and awake when [facility] arrived. He said he was ok and appreciated the attn. Pt talked briefly about his family and said he moved to [place] from [place] to be w/his brother. 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 He became tearful several times as he talked about his relationship with the [facility]. He voluntarily shared feelings of joy and concern for struggling with "messing up". CH offered emotional and spiritual support and allowed him to process his feelings. At the conclusion of our visit, pt asked for prayer. Please page if additional support is needed. Chaplain Ernest Haber, M.Div.   06/04/16 1600  Clinical Encounter Type  Visited With Patient
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
## 2016-06-04 NOTE — ED Notes (Signed)
Pt asleep and comfortable.  Non-labored breathing with symmetrical chest expansion.
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
## 2016-06-04 NOTE — ED Notes (Signed)
Pt given juice.  Refuses dinner tray or snack still.  States he is scared it will give him acid reflux.

## 2016-06-04 NOTE — Clinical Social Work Note (Signed)
CSW received notification from weekday staff that the patient was set up to discharge to [facility].  MD confirmed discharge was today.  CSW was informed at 11am to send the patient to [facility] after lunch by Juliann Pulse, [facility] liaison.  PTAR was called at noon.  At 1:58pm, CSW was contacted by [facility] Admissions coordinator, Santiago Glad who states they cannot accept the patient today and will be unable to accept the patient until Tuesday due to the patient's need for an air mattress that the facility did not have on hand.  CSW cancelled transportation and learned patient was already in route.  PTAR transported patient back to [facility] ED where patient is currently awaiting placement.   CSW explored other placement options.  Patient had other bed offers: [facility]: no admissions over the weekend, [facility]: full, [facility]: states they could take patient, but the liaison had a code blue in the building and other things that came up that would allow her to now not admit the patient today.  [facility] states they will be able to admit the patient tomorrow.   Disposition Update: [name] projected tomorrow (9/4)  Safety zone completed.  Nonnie Done, [place]

## 2016-06-04 NOTE — Consult Note (Signed)
[facility] Nurse ostomy consult note Stoma type/location: LUQ Colostomy Stomal assessment/size: 2 1/2" edematous and pink, moist Peristomal assessment: Intact Treatment options for stomal/peristomal skin: Barrier ring due to abdominal girth and uneven plane Output Liquid brown stool in pouch Ostomy pouching: 2pc. 2 3/4" pouch with barrier ring  Education provided: Patient is to be discharged to SNF today.  Has concerns about his current apartment situation.  Emotional support provided and explained that he needed skilled care and was not able to return home at this time.  Enrolled patient in [place] program: No WOC team will follow.  [name] RN BSN [place] Pager [phone]

## 2016-06-04 NOTE — Clinical Social Work Placement (Signed)
   CLINICAL SOCIAL WORK PLACEMENT  NOTE  Date:  06/04/2016  Patient Details  Name: [name] MRN: [id number] Date of Birth: [birth year]  Clinical Social Work is seeking post-discharge placement for this patient at the [place] level of care (*CSW will initial, date and re-position this form in  chart as items are completed):  Yes   Patient/family provided with [facility] Work Department's list of facilities offering this level of care within the geographic area requested by the patient (or if unable, by the patient's family).  Yes   Patient/family informed of their freedom to choose among providers that offer the needed level of care, that participate in Medicare, Medicaid or managed care program needed by the patient, have an available bed and are willing to accept the patient.  Yes   Patient/family informed of [facility]'s ownership interest in [facility] and [facility], as well as of the fact that they are under no obligation to receive care at these facilities.  PASRR submitted to EDS on 06/01/16     PASRR number received on 06/01/16     Existing PASRR number confirmed on       FL2 transmitted to all facilities in geographic area requested by pt/family on 06/01/16     FL2 transmitted to all facilities within larger geographic area on       Patient informed that his/her managed care company has contracts with or will negotiate with certain facilities, including the following:        Yes   Patient/family informed of bed offers received.  Patient chooses bed at [facility]     Physician recommends and patient chooses bed at      Patient to be transferred to [facility] on 06/04/16.  Patient to be transferred to facility by PTAR     Patient family notified on 06/04/16 of transfer.  Name of family member notified:  patient is alert and oriented x4 and agreeable to contact family as needed      PHYSICIAN Please prepare priority discharge summary, including medications     Additional Comment:    _______________________________________________ Dulcy Fanny, LCSW 06/04/2016, 12:01 PM

## 2016-06-04 NOTE — ED Notes (Signed)
Opened chart as pt has been returned to our ED after mix up with facility at discharge.

## 2016-06-04 NOTE — Clinical Social Work Note (Addendum)
3:32pm- CSW was informed at 11am to send the patient to [facility] after lunch by Juliann Pulse, liaison.  PTAR was called.  At 1:58pm, CSW was contacted by Admissions coordinator, karen who states they cannot accept the patient today and will be unable to accept the patient until Tuesday due to the patient's need for an air mattress that the facility did not have on hand.  CSW explored other options.  Patient had other bed offers: [facility]: no admissions over the weekend, [facility]: full, [facility]: states they could take patient, but the liaison had a code blue in the building and other things that came up that would allow her to now not admit the patient today.  [facility] states they will be able to admit the patient tomorrow.  Floor aware.  Disposition Update: [name] projected tomorrow (9/4)  12:04pmPatient will discharge today per MD order. Patient will discharge to [facility] RN to call report prior to transportation to: [phone] Transportation: [facility]  CSW sent discharge summary to SNF for review.    Nonnie Done, LCSW ([id number]) [id number] Licensed Clinical Social Worker

## 2016-06-04 NOTE — ED Triage Notes (Signed)
Writer explained to pt the process of why he is back in the ED, pt is having pain, EDP aware and will evaluate for meds, pt provided with soda as requested.

## 2016-06-04 NOTE — Discharge Instructions (Signed)
Follow with Primary MD Ricke Hey, MD after discharge from SNF  Get CBC, CMP,  checked  by Primary MD next visit.    Activity: As tolerated, patient is quadriplegic, with Full fall precautions.  Disposition SNF   Diet: Soft diet with thin liquid, with feeding assistance and aspiration precautions.  For Heart failure patients - Check your Weight same time everyday, if you gain over 2 pounds, or you develop in leg swelling, experience more shortness of breath or chest pain, call your Primary MD immediately. Follow Cardiac Low Salt Diet and 1.5 lit/day fluid restriction.   On your next visit with your primary care physician please Get Medicines reviewed and adjusted.   Please request your Prim.MD to go over all Hospital Tests and Procedure/Radiological results at the follow up, please get all Hospital records sent to your Prim MD by signing hospital release before you go home.   If you experience worsening of your admission symptoms, develop shortness of breath, life threatening emergency, suicidal or homicidal thoughts you must seek medical attention immediately by calling 911 or calling your MD immediately  if symptoms less severe.  You Must read complete instructions/literature along with all the possible adverse reactions/side effects for all the Medicines you take and that have been prescribed to you. Take any new Medicines after you have completely understood and accpet all the possible adverse reactions/side effects.   Do not drive, operating heavy machinery, perform activities at heights, swimming or participation in water activities or provide baby sitting services if your were admitted for syncope or siezures until you have seen by Primary MD or a Neurologist and advised to do so again.  Do not drive when taking Pain medications.    Do not take more than prescribed Pain, Sleep and Anxiety Medications  Special Instructions: If you have smoked or chewed Tobacco  in the  last 2 yrs please stop smoking, stop any regular Alcohol  and or any Recreational drug use.  Wear Seat belts while driving.   Please note  You were cared for by a hospitalist during your hospital stay. If you have any questions about your discharge medications or the care you received while you were in the hospital after you are discharged, you can call the unit and asked to speak with the hospitalist on call if the hospitalist that took care of you is not available. Once you are discharged, your primary care physician will handle any further medical issues. Please note that NO REFILLS for any discharge medications will be authorized once you are discharged, as it is imperative that you return to your primary care physician (or establish a relationship with a primary care physician if you do not have one) for your aftercare needs so that they can reassess your need for medications and monitor your lab values.

## 2016-06-04 NOTE — ED Notes (Signed)
Bed: WA15 Expected date:  Expected time:  Means of arrival:  Comments: 

## 2016-06-04 NOTE — Progress Notes (Signed)
Pharmacy Antibiotic Note  [name] is a 50 y.o. male admitted on 05/26/2016 with sacral / Ischial osteomyelitis, UTI, sacral decubitus ulcer (necrotic). S/p I&D of sacral wound on 8/29 (including bone, periosteum, muscle fat and skin) + colostomy.  Pharmacy dosing Vancomycin.  Pt with supratherapeutic levels and dose held since 8/31 AM, restarted 9/2 PM.    Plan: Vancomycin resumed 9/2 at 750mg  IV q12h.  With T1/2 ~27 hours, predict q24h dosing may be better.   -Check random level tonight and consider adjusting to q24h regimen (possibly 1250mg  q24h).   Continue flagyl and cefepime at current doses.     Height: 6' (182.9 cm) Weight: 268 lb 12.8 oz (121.9 kg) IBW/kg (Calculated) : 77.6  Temp (24hrs), Avg:98.2 F (36.8 C), Min:97.8 F (36.6 C), Max:98.7 F (37.1 C)   Recent Labs Lab 05/31/16 0551 06/01/16 0931  06/02/16 0453 06/02/16 2200 06/03/16 0440 06/03/16 1855 06/04/16 0500  WBC 17.6* 17.1*  --  16.9*  --  16.1*  --  17.0*  CREATININE 1.18 1.05  --  0.91  --  0.88  --  0.73  VANCOTROUGH  --   --   < >  --  24*  --  14*  --   < > = values in this interval not displayed.  Estimated Creatinine Clearance: 145.6 mL/min (by C-G formula based on SCr of 0.8 mg/dL).    Allergies  Allergen Reactions  . No Known Allergies     Antimicrobials this admission: Rocephin 8/25 >>  Vancomycin 8/25 >> 8/27( MD accidentally d/ced)>> resumed 8/28 >> Flagyl PO 8/31 >>   Dose adjustments this admission: 8/25: 2gm x1, then 1750 q48 8/28: resumed back as 1500 mg q24h for improving renal funct 8/29: changed vanc dose to 1gm q12h 8/31 VT @  2150= 51 on 1g IV q12h (rcvd 6500mg  over 48h period)- HOLD vanc 9/1 VT @ 2200 = 24, continue to HOLD vanc 9/2 VT @ 1900 = 14, K~0.0257, T1/2 ~27 hours, resumed vanc 750mg  q12h   Microbiology results: 8/25 UCx: >100K ecoli (R=cipro, ampi; I= unasyn) 8/25 BCx x2: NGTD 8/28 MRSA PCR (+) 8/29 MRSA PCR Negative / Staph PCR Negative  Thank you  for allowing pharmacy to be a part of this patient's care.  Ralene Bathe, PharmD, BCPS 06/04/2016, 10:41 AM  Pager: [phone]

## 2016-06-04 NOTE — ED Triage Notes (Addendum)
Hospital bed requested for pt comfort. Pt will ? Go to [employer] stated by SW

## 2016-06-05 LAB — TYPE AND SCREEN
ABO/RH(D): B POS
Antibody Screen: NEGATIVE
UNIT DIVISION: 0
Unit division: 0

## 2016-06-05 MED ORDER — HYDROMORPHONE HCL 1 MG/ML IJ SOLN
1.0000 mg | Freq: Once | INTRAMUSCULAR | Status: AC
  Administered 2016-06-05: 1 mg via INTRAVENOUS
  Filled 2016-06-05: qty 1

## 2016-06-05 MED ORDER — DEXTROSE 5 % IV SOLN: 2.0000 g | Freq: Two times a day (BID) | INTRAVENOUS | 0 refills | Status: AC

## 2016-06-05 MED ORDER — VANCOMYCIN HCL 1000 MG IV SOLR: 1250.0000 mg | INTRAVENOUS | 0 refills | Status: AC

## 2016-06-05 MED ORDER — VANCOMYCIN HCL 10 G IV SOLR
1250.0000 mg | INTRAVENOUS | Status: DC
  Filled 2016-06-05: qty 1250

## 2016-06-05 MED ORDER — OXYCODONE HCL 5 MG PO TABS
10.0000 mg | ORAL_TABLET | Freq: Once | ORAL | Status: AC
  Administered 2016-06-05: 10 mg via ORAL
  Filled 2016-06-05: qty 2

## 2016-06-05 MED ORDER — SODIUM CHLORIDE 0.9 % IV SOLN: 250.0000 mL | INTRAVENOUS | Status: DC | PRN

## 2016-06-05 MED ORDER — SODIUM CHLORIDE 0.9% FLUSH: 3.0000 mL | INTRAVENOUS | Status: DC | PRN

## 2016-06-05 MED ORDER — SODIUM CHLORIDE 0.9% FLUSH
3.0000 mL | Freq: Two times a day (BID) | INTRAVENOUS | Status: DC
  Administered 2016-06-05: 3 mL via INTRAVENOUS

## 2016-06-05 NOTE — ED Notes (Signed)
Pt requesting a hospice consult

## 2016-06-05 NOTE — Progress Notes (Signed)
[facility]  Patient Status:  New pt with [facility] this admission  [facility] is providing the following services: Pt was active with [facility] in the last month.  Pt request [place] nursing with [facility] team. Referral arrangements made with York Spaniel, rep with [facility] covering [facility].   All referral information and IV ABX scripts faxed to Dawn/[facility].  Teaching provided with pt for IV Vancomycin via elastomeric and Cefepime via IV Push. Pt did very well with procedure and also demonstrated good technique.  Pt does not have a caregiver at home but feels he can manage. Advised pt of need to administer IV ABX exactly as he was instructed. Pt verbally acknowledges understanding. Alerted Dawn with [facility] of IV ABX delivery to the home by 10 AM tomorrow for [facility] by [facility] RN for doses between 11 -12PM. Pt plans to dose Cefepime 11A/11P and Vancomycin at 12 noon.   If patient discharges after hours, please call [phone].   Larry Sierras 06/05/2016, 5:18 PM

## 2016-06-05 NOTE — ED Notes (Signed)
RN contacted [facility] on their after hours line at [phone] as instructed.  RN was not able to reach anyone on the first attempt.  RN was successful on the second attempt.  RN spoke with Adrian [place].

## 2016-06-05 NOTE — Progress Notes (Signed)
ED CM made contact with [facility] staff, and advanced care staff for [facility], PT, SW, Air mattress and iv therapy  CM discussed with pt that because of Labor day holiday there may be delays Pt the states "I understand. I want to go home"

## 2016-06-05 NOTE — ED Notes (Signed)
Social work at bedside.  

## 2016-06-05 NOTE — Progress Notes (Addendum)
ED CM available when IV therapy RN, Pam, available coordinating care with pt and speaking with [facility] staff to coordinate home iv dosing Home care referral accepted by [facility]   1432 ED CM, Pam (iv therapy) spoke with Dr Tyrone Nine after he spoke with pt- about discharging pt after his first doses of cefepime and vancomycin iv in the ED today, face to face order and consult with ID Dr comer about possible other treatment plan -po vs iv picc line for pt considering pmh cocaine abuse

## 2016-06-05 NOTE — Progress Notes (Signed)
ED CM contacted ED SW to ask about [facility] for this pt- bad choice

## 2016-06-05 NOTE — Progress Notes (Signed)
Pharmacy Antibiotic Note  [name] is a 50 y.o. male admitted on 06/04/2016 with sacral / Ischial osteomyelitis, UTI, sacral decubitus ulcer (necrotic). S/p I&D of sacral wound on 8/29 (including bone, periosteum, muscle fat and skin) + colostomy.  Pharmacy dosing Vancomycin.  Pt with supratherapeutic levels and dose held since 8/31 AM, restarted 9/2 PM.    9/4 update: Pt refusing antibiotics and SNF.  Wants to go home.  Trying to reschedule pt to at least get abx before he leaves hospital and home health can get to house tomorrow.  Discussed with MD to make sure pt gets VT and SCr on Thursday.    Plan: Continue Vancomycin 1250mg  q24h  Continue Cefepime 2g IV q12h and Flagyl 500mg  PO q8h.       Height: 6' (182.9 cm) Weight: 260 lb (117.9 kg) IBW/kg (Calculated) : 77.6  Temp (24hrs), Avg:98 F (36.7 C), Min:97.9 F (36.6 C), Max:98.1 F (36.7 C)   Recent Labs Lab 05/31/16 0551 06/01/16 0931  06/02/16 0453 06/02/16 2200 06/03/16 0440 06/03/16 1855 06/04/16 0500  WBC 17.6* 17.1*  --  16.9*  --  16.1*  --  17.0*  CREATININE 1.18 1.05  --  0.91  --  0.88  --  0.73  VANCOTROUGH  --   --   < >  --  24*  --  14*  --   < > = values in this interval not displayed.  Estimated Creatinine Clearance: 143.2 mL/min (by C-G formula based on SCr of 0.8 mg/dL).    Allergies  Allergen Reactions  . No Known Allergies     Antimicrobials this admission: Rocephin 8/25 >>  Vancomycin 8/25 >> 8/27( MD accidentally d/ced)>> resumed 8/28 >> Flagyl PO 8/31 >>   Dose adjustments this admission: 8/25: 2gm x1, then 1750 q48 8/28: resumed back as 1500 mg q24h for improving renal funct 8/29: changed vanc dose to 1gm q12h 8/31 VT @  2150= 51 on 1g IV q12h (rcvd 6500mg  over 48h period)- HOLD vanc 9/1 VT @ 2200 = 24, continue to HOLD vanc 9/2 VT @ 1900 = 14, K~0.0257, T1/2 ~27 hours, resumed vanc 750mg  q12h   Microbiology results: 8/25 UCx: >100K ecoli (R=cipro, ampi; I= unasyn) 8/25 BCx x2:  NGTD 8/28 MRSA PCR (+) 8/29 MRSA PCR Negative / Staph PCR Negative  Thank you for allowing pharmacy to be a part of this patient's care.  Ralene Bathe, PharmD, BCPS 06/05/2016, 2:48 PM  Pager: [phone]

## 2016-06-05 NOTE — ED Notes (Signed)
tx called for patient; awaiting transport

## 2016-06-05 NOTE — ED Notes (Signed)
Pt refuses to change position and refuses dressing changes to his saccrum area, pt refuses assessment, md notified

## 2016-06-05 NOTE — ED Notes (Signed)
Several stage 4 pressure ulcer to patients saccral area, all wounds cleaned and changed

## 2016-06-05 NOTE — ED Notes (Signed)
[facility] RN at bedside

## 2016-06-05 NOTE — Progress Notes (Addendum)
CSW was informed that patient's disposition is to go to [facility] on today.   CSW spoke with patient who states he does not want to go to [facility] and that he rather go to his home. Patient reports he does not want to loose his "crib", meaning his home. Patient reports he was sent to [facility] on yesterday and then transported back to the [facility]. The CSW note dated 9//3/17 states [facility] was unable to accept patient due to the need of an air mattress. Patient states no one came to speak with him regarding [facility]. CSW explained to patient that he should take into consideration that a facility would be able to provide appropriate care that he needs at this time. Patient was adamant concerning going to his home and that he did not want to go to a facility. CSW asked patient if he had any home health services before and he stated with [facility].  Call received from [place], Admissions with [facility] who states they will accept patient and they are offering patient a bed on today. She stated they will need a PT consult completed in order to accept patient. She also noted the EDP will need to note on the discharge summary why the patient did not discharge on yesterday and a recommendation for SNF. She stated they would not be able to accept the PT note in the system as she states they do not complete hydroptherapy treatment. CSW spoke with EDP and he stated he would make notation of this information on the discharge summary and he would place an order for the PT consult.   CSW staffed case with Asst. Director of Social Work. He stated to continue to work with the facility regarding placement and have home health as an option.   CSW spoke with [facility] ED CM [phone] regarding home health. She stated to ask patient what agency he would choose and have the EDP put in an order for a face-to-face for home health services.   ED CM and CSW spoke with  patient at bedside. ED CM asked patient about home health services. Patient vented his frustrations and continued to stress that he wants to go to his home. EDP and Nurse updated regarding patient going home with home health.   CSW spoke with Janett Billow, Admissions with [facility] who now states they will not be able to accept patient due to patient being non-compliant with treatment.   Spoke with Asst. Director of Social Work on conference call with ED CM regarding all of patient's needs and changes from the time patient was admitted. Asst. Director states he would call Harriet Pho, Liaison with [facility] and call back.   Call from Asst. Director of Social Work stating [facility] is willing to accept patient if the EDP states patient can go to the facility as long as the EDP agrees patient can go being aware that the air mattress will not arrive at the facility until tomorrow. Staffed with EDP and he stated if the mattress does not arrive until tomorrow that the patient would need to be turned four times daily. Asst. Director of Social Work also stated if patient refuses to go to the facility, then the EDP would need to make the decision regarding patient being allowed to go home.   ED CM and CSW spoke with patient at bedside. Patient was informed that [facility] has  a bed for him and he can be transported on today. Patient stated again and was adamant that he did not want to go to a facility and that he wanted to go home. CSW clearly asked patient if he wanted to go to [facility] SNF and he stated "no I want to go home". CSW explained to patient again taking into consideration that the SNF would be able to provide  appropriate care for his medical needs. ED CM explained to patient that all of the services put in place for him may not be available until later in the week. Patient stated "I'm gonna take care of myself and if I die, let me  die". Patient stated "I've been doing this for thirty years". CSW asked patient twice to ensure he understood that he may not be seen until the end of the week for the services ED CM is putting in place for him. Patient stated "yes" that he understood. Patient reports [facility] had nurses coming into the home and that he also had services with [facility] that prepared his meals, clean the home, and obtain any of his needs from shopping stores. CSW updated Nurse and informed EDP that patient wants to go home instead of the SNF.   Genice Rouge [id number] ED CSW 06/05/2016 1:21 PM

## 2016-06-05 NOTE — ED Notes (Signed)
RN contacted [facility] to confirm they have been notified of need for Pt transport.  They have and are en route to Pt at this time.

## 2016-06-05 NOTE — Discharge Instructions (Signed)
Patient is unable to take care of himself at home. He was unable to replace yesterday due to their being a bed required for his condition. He was recommended for SNF.

## 2016-06-05 NOTE — ED Notes (Signed)
Dr. Floyd at bedside. 

## 2016-06-05 NOTE — Progress Notes (Signed)
ED CM consulted by ED SW Pt with placement to [facility] but returned to [facility] ED  SW attempting to get pt to [facility] with an air mattress-- refer to SW note  SW and CM in pt room to assess d/c needs Pt REFUSING DISCHARGE TO [facility] and preferring DISCHARGE HOME CM assessed for d/c needs Pt states he has a Optician, dispensing for "for seventy some hours and when she is not there I take care of myself"   CM discussed with pt that home health services/DME may not be available for 24-48 hours CM recommend pt d/c to [facility] and discussed his choice to d/c is unsafe Pt adamant about d/c home to his home "so I do not lose my place" Pt allowed to ventilate his feeling Pt using a lot of "they did this" during his ventilation Cm asked pt who he was referring to by "they" and informed he was talking about "the hospital" " they are trying to charge me again" "I'm not slow"

## 2016-06-05 NOTE — Progress Notes (Signed)
Call from pam c [facility] iv therapy team member Reviewed pt case info with her - d/c 06/04/16, return to [facility] ED 06/04/16, pcs per pt, [facility], air mattress, attempts to get in snf, refusal of snf, aps  Pam wants to see pt prior to him leaving ED

## 2016-06-05 NOTE — ED Notes (Signed)
[facility] nurse teaching at bedside

## 2016-06-05 NOTE — Progress Notes (Signed)
ED CM spoke with [name] and TCU RN pt got vanc iv  ED CM PM updated ED PM SW & SW assistant updated  Pt still want d/c home Faxed clinicals to [facility] [phone]

## 2016-06-05 NOTE — ED Notes (Signed)
Pt refuses all medications & treatment until he gets dilaudid pain medication, dr. Tyrone Nine notified

## 2016-06-05 NOTE — ED Notes (Signed)
Spoke to social work and Dr. Tyrone Nine, picc line & foley are to remain intact

## 2016-06-05 NOTE — ED Notes (Signed)
Pt refuses all medications until he gets the pain medicine he wants, md notified

## 2016-06-05 NOTE — ED Notes (Signed)
Pt refuses all medications, pt refuses iv medications

## 2016-06-05 NOTE — ED Notes (Signed)
Social work at bedside, checking into placement at [facility]?

## 2016-06-05 NOTE — Progress Notes (Signed)
ED CM and ED SW spoke with pt again to try to convince him to d/c to snf vs home Cm discussed the contacts made with [facility], [facility] DME and iv therapy team Discussed there it 24-48 hours for service start Reviewed need for iv therapy, air mattress,  Pt again repeats a story with a lot of "they" statements about being d/c, [facility], returning to ED, a lady talking to him about "signing in" and "it's a conspiracy' "they have not taken care of me here"  Cm noted during interaction with pt that he has bee seen by TCU nursing x 3 for food (meal/snack) and dressing change plus with a sitter at bedside Pt with meal tray on table at this time Pt states he understands the ED CM and SW recommends the best plan for him is to go to snf to be able to get all recommended d/c services Pt states he understands that CM has called services for home care/DME and that today is Labor day, a holiday and services may be delayed but he still wants d/c home.  Pt states he will take care of him self at home. "I have been taking care of myself over thirty years"  Cm inquired if the pt had called his personal care services agency Cm encouraged him to call his agency ASAP to get services started.  Pt states "they may be closed so I will call tomorrow."  Discussed with pt that Cm did contact [facility], and [facility]

## 2016-06-05 NOTE — Progress Notes (Addendum)
ED CM in TCU when TCU RN went to initiate pt iv antibiotics doses needed prior to d/c home  TCU nurse explained to pt why he needed iv antibiotics started and answered all his questions.  Pam Advanced Iv therapy pt updated

## 2016-06-05 NOTE — ED Notes (Signed)
Gave report to rachael RN

## 2016-06-05 NOTE — ED Notes (Signed)
Pt reports slight decrease in pain after Tramadol.

## 2016-06-05 NOTE — Progress Notes (Signed)
ED CM went to pt's room to discuss with TCU RN that [facility] IV therapy staff wants to see pt prior to d/c to evaluate if pt will be able to receive iv antibiotic therapy at home appropriate TCU RN is cleaning pt's sacral wound Reports it is a "stage four"  As Cm stands in his room  RN voices understanding pt not to be d/c yet. ED CM and SW spoke with SW Surveyor, quantity related pt APS, d/c plans, Metallurgist to reach back out to [employer], Juliann Pulse

## 2016-06-05 NOTE — ED Notes (Signed)
Pt refused percocet and is requesting dilaudid, md notified

## 2016-06-06 ENCOUNTER — Telehealth: Payer: Self-pay | Admitting: *Deleted

## 2016-06-06 MED FILL — OXYCODONE-APAP 7.5/325MG: 7.5-325 | 5 days supply | Qty: 20 | Fill #0

## 2016-06-06 MED FILL — traMADol HCL 50 MG TABS: 50 | 5 days supply | Qty: 20 | Fill #0

## 2016-06-06 NOTE — Progress Notes (Signed)
10:56am- CSW spoke with Elayne Guerin, [facility] Adult [employer] Social Worker and made an APS report. Asst. Director of Social Work updated.   Genice Rouge [id number] ED CSW 06/06/2016 11:31 AM

## 2016-06-07 ENCOUNTER — Telehealth: Payer: Self-pay | Admitting: *Deleted

## 2016-06-08 ENCOUNTER — Emergency Department (HOSPITAL_COMMUNITY)
Admission: EM | Admit: 2016-06-08 | Discharge: 2016-06-09 | Disposition: A | Discharge status: Home / Self Care | Payer: Medicare Other | Attending: Emergency Medicine | Admitting: Emergency Medicine

## 2016-06-08 ENCOUNTER — Encounter (HOSPITAL_COMMUNITY): Payer: Self-pay

## 2016-06-08 ENCOUNTER — Telehealth: Payer: Self-pay | Admitting: *Deleted

## 2016-06-08 ENCOUNTER — Emergency Department (HOSPITAL_COMMUNITY): Payer: Medicare Other

## 2016-06-08 DIAGNOSIS — G8220 Paraplegia, unspecified: Secondary | ICD-10-CM | POA: Insufficient documentation

## 2016-06-08 DIAGNOSIS — L89154 Pressure ulcer of sacral region, stage 4: Secondary | ICD-10-CM | POA: Diagnosis not present

## 2016-06-08 DIAGNOSIS — L89890 Pressure ulcer of other site, unstageable: Secondary | ICD-10-CM | POA: Diagnosis not present

## 2016-06-08 DIAGNOSIS — L8990 Pressure ulcer of unspecified site, unspecified stage: Secondary | ICD-10-CM

## 2016-06-08 DIAGNOSIS — I10 Essential (primary) hypertension: Secondary | ICD-10-CM | POA: Insufficient documentation

## 2016-06-08 DIAGNOSIS — Z79899 Other long term (current) drug therapy: Secondary | ICD-10-CM | POA: Diagnosis not present

## 2016-06-08 DIAGNOSIS — R531 Weakness: Secondary | ICD-10-CM | POA: Diagnosis present

## 2016-06-08 DIAGNOSIS — F17210 Nicotine dependence, cigarettes, uncomplicated: Secondary | ICD-10-CM | POA: Insufficient documentation

## 2016-06-08 LAB — COMPREHENSIVE METABOLIC PANEL
ALT: 8 U/L — AB (ref 17–63)
AST: 15 U/L (ref 15–41)
Albumin: 1.8 g/dL — ABNORMAL LOW (ref 3.5–5.0)
Alkaline Phosphatase: 54 U/L (ref 38–126)
Anion gap: 6 (ref 5–15)
BUN: 7 mg/dL (ref 6–20)
CHLORIDE: 108 mmol/L (ref 101–111)
CO2: 27 mmol/L (ref 22–32)
CREATININE: 0.67 mg/dL (ref 0.61–1.24)
Calcium: 6.9 mg/dL — ABNORMAL LOW (ref 8.9–10.3)
GFR calc Af Amer: >60 mL/min (ref >60)
Glucose, Bld: 93 mg/dL (ref 65–99)
Potassium: 2.9 mmol/L — ABNORMAL LOW (ref 3.5–5.1)
Sodium: 141 mmol/L (ref 135–145)
Total Bilirubin: 0.5 mg/dL (ref 0.3–1.2)
Total Protein: 6.5 g/dL (ref 6.5–8.1)

## 2016-06-08 LAB — CBC WITH DIFFERENTIAL/PLATELET
Basophils Absolute: 0 10*3/uL (ref 0.0–0.1)
Basophils Relative: 0 %
EOS ABS: 0.2 10*3/uL (ref 0.0–0.7)
EOS PCT: 2 %
HCT: 24.5 % — ABNORMAL LOW (ref 39.0–52.0)
Hemoglobin: 7.9 g/dL — ABNORMAL LOW (ref 13.0–17.0)
LYMPHS ABS: 2.4 10*3/uL (ref 0.7–4.0)
Lymphocytes Relative: 19 %
MCH: 28.1 pg (ref 26.0–34.0)
MCHC: 32.2 g/dL (ref 30.0–36.0)
MCV: 87.2 fL (ref 78.0–100.0)
MONOS PCT: 6 %
Monocytes Absolute: 0.8 10*3/uL (ref 0.1–1.0)
Neutro Abs: 9.2 10*3/uL — ABNORMAL HIGH (ref 1.7–7.7)
Neutrophils Relative %: 73 %
PLATELETS: 520 10*3/uL — AB (ref 150–400)
RBC: 2.81 MIL/uL — ABNORMAL LOW (ref 4.22–5.81)
RDW: 17.3 % — ABNORMAL HIGH (ref 11.5–15.5)
WBC: 12.7 10*3/uL — ABNORMAL HIGH (ref 4.0–10.5)

## 2016-06-08 LAB — URINALYSIS, ROUTINE W REFLEX MICROSCOPIC
BILIRUBIN URINE: NEGATIVE
Glucose, UA: NEGATIVE mg/dL
Ketones, ur: NEGATIVE mg/dL
Leukocytes, UA: NEGATIVE
Nitrite: NEGATIVE
PROTEIN: NEGATIVE mg/dL
Specific Gravity, Urine: 1.011 (ref 1.005–1.030)
pH: 6 (ref 5.0–8.0)

## 2016-06-08 LAB — URINE MICROSCOPIC-ADD ON: Bacteria, UA: NONE SEEN

## 2016-06-08 MED ORDER — POTASSIUM CHLORIDE CRYS ER 20 MEQ PO TBCR
40.0000 meq | EXTENDED_RELEASE_TABLET | Freq: Once | ORAL | Status: AC
  Administered 2016-06-08: 40 meq via ORAL
  Filled 2016-06-08: qty 2

## 2016-06-08 MED ORDER — POTASSIUM CHLORIDE 10 MEQ/100ML IV SOLN
10.0000 meq | INTRAVENOUS | Status: AC
  Administered 2016-06-08 (×3): 10 meq via INTRAVENOUS
  Filled 2016-06-08 (×2): qty 100

## 2016-06-08 NOTE — ED Notes (Signed)
Patients colostomy bag changed. Patient tolerated well, just stated a little sore.

## 2016-06-08 NOTE — NC FL2 (Signed)
[place] LEVEL OF CARE SCREENING TOOL     IDENTIFICATION  Patient Name: [name] Birthdate: 1966-05-20 Sex: male Admission Date (Current Location): 06/08/2016  [facility] and [place] Number:  Herbalist and Address:  [facility],  [address], [place]      Provider Number: [id number]  Attending Physician Name and Address:  Sharlett Iles, MD  Relative Name and Phone Number:       Current Level of Care: Hospital Recommended Level of Care: [place] Prior Approval Number:    Date Approved/Denied:   PASRR Number:    Discharge Plan: SNF    Current Diagnoses: Patient Active Problem List   Diagnosis Date Noted  . Osteomyelitis with necrosis of sacrum  06/01/2016  . Colostomy in place for fecal diversion 06/01/2016  . Protein-calorie malnutrition, severe ([place]) 05/31/2016  . Hypokalemia 05/26/2016  . Acute renal failure (ARF) ([place]) 05/26/2016  . Dehydration 05/26/2016  . Chronic pain 05/26/2016  . Leukocytosis 05/26/2016  . Decubitus ulcers 05/26/2016  . Wound infection ([place]) 05/26/2016  . Anemia 05/26/2016  . Hypertension 05/26/2016  . Prolonged QT interval 05/26/2016  . Rhabdomyolysis 05/26/2016  . Paraplegia (H[facility])     Orientation RESPIRATION BLADDER Height & Weight     Self, Time, Situation, Place    Continent Weight:   Height:     BEHAVIORAL SYMPTOMS/MOOD NEUROLOGICAL BOWEL NUTRITION STATUS      Incontinent Diet (regular)  AMBULATORY STATUS COMMUNICATION OF NEEDS Skin   Total Care   Skin abrasions (5-pressure uclers on bottom, 3- ports on abdomen )                       Personal Care Assistance Level of Assistance  Total care Bathing Assistance: Maximum assistance   Dressing Assistance: Maximum assistance Total Care Assistance: Maximum assistance   Functional Limitations Info             SPECIAL CARE FACTORS FREQUENCY                       Contractures       Additional Factors Info    Code Status Info: Prior Allergies Info: NKA     Isolation Precautions Info: MRSA     Current Medications (06/08/2016):  This is the current hospital active medication list No current facility-administered medications for this encounter.    Current Outpatient Prescriptions  Medication Sig Dispense Refill  . amLODipine-benazepril (LOTREL) 10-20 MG capsule Take 1 capsule by mouth daily.    . feeding supplement, ENSURE ENLIVE, (ENSURE ENLIVE) LIQD Take 237 mLs by mouth 2 (two) times daily between meals. 237 mL 12  . metroNIDAZOLE (FLAGYL) 500 MG tablet Take 1 tablet (500 mg total) by mouth every 8 (eight) hours. To continue through October 5    . oxyCODONE-acetaminophen (PERCOCET) 7.5-325 MG tablet Take 1 tablet by mouth every 6 (six) hours as needed for severe pain. 20 tablet 0  . ranitidine (ZANTAC) 300 MG capsule Take 300 mg by mouth 2 (two) times daily.    . traMADol (ULTRAM) 50 MG tablet Take 1 tablet (50 mg total) by mouth every 6 (six) hours as needed (gets # 30 monthly, last filled 8/31). 20 tablet 0  . alum & mag hydroxide-simeth (MAALOX/MYLANTA) 200-200-20 MG/5ML suspension Take 30 mLs by mouth every 6 (six) hours as needed for indigestion or heartburn (or bloating). (Patient not taking: Reported on 06/08/2016) 355 mL  0  . Amino Acids-Protein Hydrolys (FEEDING SUPPLEMENT, PRO-STAT SUGAR FREE 64,) LIQD Take 30 mLs by mouth 3 (three) times daily. (Patient not taking: Reported on 06/08/2016) 900 mL 0  . ceFEPIme 2 g in dextrose 5 % 50 mL Inject 2 g into the vein every 12 (twelve) hours. (Patient not taking: Reported on 06/08/2016) 128 g 0  . collagenase (SANTYL) ointment Apply topically daily. (Patient not taking: Reported on 06/08/2016) 15 g 0  . ferrous sulfate (FERROUSUL) 325 (65 FE) MG tablet Take 1 tablet (325 mg total) by mouth 2 (two) times daily with a meal. (Patient not taking: Reported on 06/08/2016)  3  . magic mouthwash SOLN Take 15 mLs by mouth 4 (four)  times daily as needed for mouth pain (sore throat). (Patient not taking: Reported on 06/08/2016)  0  . pantoprazole (PROTONIX) 40 MG tablet Take 1 tablet (40 mg total) by mouth daily. (Patient not taking: Reported on 06/08/2016)    . phosphorus (K PHOS NEUTRAL) 155-852-130 MG tablet Take 2 tablets (500 mg total) by mouth 2 (two) times daily. Please take for 5 days then stop (Patient not taking: Reported on 06/08/2016) 10 tablet 0  . pregabalin (LYRICA) 50 MG capsule Take 1 capsule (50 mg total) by mouth 2 (two) times daily. (Patient not taking: Reported on 06/08/2016)    . psyllium (HYDROCIL/METAMUCIL) 95 % PACK Take 1 packet by mouth 2 (two) times daily. (Patient not taking: Reported on 06/08/2016) 56 each   . vancomycin 1,250 mg in sodium chloride 0.9 % 250 mL Inject 1,250 mg into the vein daily. To continue to through Oct 5th,please check Vanco trough on 9/7 (Patient not taking: Reported on 06/08/2016) 40000 mg 0  . vitamin C (VITAMIN C) 500 MG tablet Take 1 tablet (500 mg total) by mouth 2 (two) times daily. (Patient not taking: Reported on 06/08/2016)       Discharge Medications: Please see discharge summary for a list of discharge medications.  Relevant Imaging Results:  Relevant Lab Results:   Additional [place], LCSW

## 2016-06-08 NOTE — Progress Notes (Signed)
CSW spoke to patient and he is now agreeable to SNF once stable for discharge. CSW will fax his information to facilities in [place]. CSW will continue to update.   Kingsley Spittle, [place] Clinical Social Worker [phone]

## 2016-06-08 NOTE — ED Triage Notes (Signed)
Per EMS, pt is from home.  Pt c/o weakness.  Pt has decub ulcer and comes in for treatment.  Pt was d/c and was to to go rehab but unable to get in. Has home health. Wednesday, pt started developing weak and ill. Nurse reported unknown amount of temp.  Pt is paraplegic with ostomy bag.  Vitals: 141/78, hr 91, resp 18, 99% ra, 98.7

## 2016-06-09 DIAGNOSIS — L89154 Pressure ulcer of sacral region, stage 4: Secondary | ICD-10-CM | POA: Diagnosis not present

## 2016-06-09 MED ORDER — METRONIDAZOLE 500 MG PO TABS
500.0000 mg | ORAL_TABLET | Freq: Three times a day (TID) | ORAL | Status: DC
  Administered 2016-06-09 (×2): 500 mg via ORAL
  Filled 2016-06-09 (×2): qty 1

## 2016-06-09 MED ORDER — HEPARIN SOD (PORK) LOCK FLUSH 100 UNIT/ML IV SOLN
250.0000 [IU] | INTRAVENOUS | Status: AC | PRN
  Administered 2016-06-09: 250 [IU]

## 2016-06-09 MED ORDER — FAMOTIDINE 20 MG PO TABS
20.0000 mg | ORAL_TABLET | Freq: Every day | ORAL | Status: DC
  Administered 2016-06-09: 20 mg via ORAL
  Filled 2016-06-09: qty 1

## 2016-06-09 MED ORDER — AMLODIPINE BESYLATE 10 MG PO TABS
10.0000 mg | ORAL_TABLET | Freq: Once | ORAL | Status: AC
  Administered 2016-06-09: 10 mg via ORAL
  Filled 2016-06-09: qty 1

## 2016-06-09 MED ORDER — OXYCODONE-ACETAMINOPHEN 7.5-325 MG PO TABS
1.0000 | ORAL_TABLET | Freq: Four times a day (QID) | ORAL | Status: DC | PRN
  Administered 2016-06-09 (×2): 1 via ORAL
  Filled 2016-06-09 (×3): qty 1

## 2016-06-09 MED ORDER — BENAZEPRIL HCL 20 MG PO TABS
20.0000 mg | ORAL_TABLET | Freq: Every day | ORAL | Status: DC
  Administered 2016-06-09: 20 mg via ORAL
  Filled 2016-06-09: qty 1

## 2016-06-09 NOTE — ED Notes (Signed)
Patient resting comfortably at this time. Equal rise and fall of chest. Vital signs stable.

## 2016-06-09 NOTE — ED Notes (Signed)
Discharge instructions reviewed with Nursing Home staff. Nursing Home staff verbalized understanding.

## 2016-06-09 NOTE — NC FL2 (Signed)
[place] LEVEL OF CARE SCREENING TOOL     IDENTIFICATION  Patient Name: [name] Birthdate: Dec 20, 1965 Sex: male Admission Date (Current Location): 06/08/2016  [facility] and [place] Number:  Herbalist and Address:  [facility],  [place], [place]      Provider Number: [id number]  Attending Physician Name and Address:  Sharlett Iles, MD  Relative Name and Phone Number:       Current Level of Care: Hospital Recommended Level of Care: [place] Prior Approval Number:    Date Approved/Denied:   PASRR Number:  [id number]  Discharge Plan: SNF    Current Diagnoses: Patient Active Problem List   Diagnosis Date Noted  . Osteomyelitis with necrosis of sacrum  06/01/2016  . Colostomy in place for fecal diversion 06/01/2016  . Protein-calorie malnutrition, severe ([place]) 05/31/2016  . Hypokalemia 05/26/2016  . Acute renal failure (ARF) ([place]) 05/26/2016  . Dehydration 05/26/2016  . Chronic pain 05/26/2016  . Leukocytosis 05/26/2016  . Decubitus ulcers 05/26/2016  . Wound infection ([place]) 05/26/2016  . Anemia 05/26/2016  . Hypertension 05/26/2016  . Prolonged QT interval 05/26/2016  . Rhabdomyolysis 05/26/2016  . Paraplegia ([place])     Orientation RESPIRATION BLADDER Height & Weight     Self, Time, Situation, Place    Continent Weight: 260 lb (117.9 kg) Height:  6' (182.9 cm)  BEHAVIORAL SYMPTOMS/MOOD NEUROLOGICAL BOWEL NUTRITION STATUS      Incontinent Diet (regular)  AMBULATORY STATUS COMMUNICATION OF NEEDS Skin   Total Care   Skin abrasions (5-pressure uclers on bottom, 3- ports on abdomen )                       Personal Care Assistance Level of Assistance  Total care Bathing Assistance: Maximum assistance   Dressing Assistance: Maximum assistance Total Care Assistance: Maximum assistance   Functional Limitations Info             SPECIAL CARE FACTORS FREQUENCY                        Contractures      Additional Factors Info    Code Status Info: Prior Allergies Info: NKA     Isolation Precautions Info: MRSA     Current Medications (06/09/2016):  This is the current hospital active medication list Current Facility-Administered Medications  Medication Dose Route Frequency Provider Last Rate Last Dose  . benazepril (LOTENSIN) tablet 20 mg  20 mg Oral Daily Sharlett Iles, MD   20 mg at 06/09/16 0135  . famotidine (PEPCID) tablet 20 mg  20 mg Oral Daily Sharlett Iles, MD   20 mg at 06/09/16 0910  . metroNIDAZOLE (FLAGYL) tablet 500 mg  500 mg Oral Q8H Sharlett Iles, MD   500 mg at 06/09/16 [id number]  . oxyCODONE-acetaminophen (PERCOCET) 7.5-325 MG per tablet 1 tablet  1 tablet Oral Q6H PRN Sharlett Iles, MD   1 tablet at 06/09/16 0910   Current Outpatient Prescriptions  Medication Sig Dispense Refill  . amLODipine-benazepril (LOTREL) 10-20 MG capsule Take 1 capsule by mouth daily.    . feeding supplement, ENSURE ENLIVE, (ENSURE ENLIVE) LIQD Take 237 mLs by mouth 2 (two) times daily between meals. 237 mL 12  . metroNIDAZOLE (FLAGYL) 500 MG tablet Take 1 tablet (500 mg total) by mouth every 8 (eight) hours. To continue through October 5    . oxyCODONE-acetaminophen (PERCOCET)  7.5-325 MG tablet Take 1 tablet by mouth every 6 (six) hours as needed for severe pain. 20 tablet 0  . ranitidine (ZANTAC) 300 MG capsule Take 300 mg by mouth 2 (two) times daily.    . traMADol (ULTRAM) 50 MG tablet Take 1 tablet (50 mg total) by mouth every 6 (six) hours as needed (gets # 30 monthly, last filled 8/31). 20 tablet 0  . alum & mag hydroxide-simeth (MAALOX/MYLANTA) 200-200-20 MG/5ML suspension Take 30 mLs by mouth every 6 (six) hours as needed for indigestion or heartburn (or bloating) 355 mL 0  . Amino Acids-Protein Hydrolys (FEEDING SUPPLEMENT, PRO-STAT SUGAR FREE 64,) LIQD Take 30 mLs by mouth 3 (three) times daily. 900 mL 0  . ceFEPIme 2 g in  dextrose 5 % 50 mL Inject 2 g into the vein every 12 (twelve) hours.  128 g 0  . collagenase (SANTYL) ointment Apply topically daily.  15 g 0  . ferrous sulfate (FERROUSUL) 325 (65 FE) MG tablet Take 1 tablet (325 mg total) by mouth 2 (two) times daily with a meal.  3  . magic mouthwash SOLN Take 15 mLs by mouth 4 (four) times daily as needed for mouth pain (sore throat).   0  . pantoprazole (PROTONIX) 40 MG tablet Take 1 tablet (40 mg total) by mouth daily.     . pregabalin (LYRICA) 50 MG capsule Take 1 capsule (50 mg total) by mouth 2 (two) times daily.     . psyllium (HYDROCIL/METAMUCIL) 95 % PACK Take 1 packet by mouth 2 (two) times daily. (Patient not taking: Reported on 06/08/2016) 56 each   . vancomycin 1,250 mg in sodium chloride 0.9 % 250 mL     Cefepime 2g in dextrose 5% 59ml Inject 1,250 mg into the vein daily. To continue to through Oct 5th,please check Vanco trough on 9/7    Inject 2g into vein every 12 hours 40000 mg      128g 0      0  . vitamin C (VITAMIN C) 500 MG tablet Take 1 tablet (500 mg total) by mouth 2 (two) times daily.        Discharge Medications:  Relevant Imaging Results:  Relevant Lab Results:   Additional Information  SS# [id number], Addendum completed by Virgel Manifold, MD  Guadelupe Sabin, LCSW

## 2016-06-09 NOTE — ED Notes (Signed)
[facility] at bedside 

## 2016-06-09 NOTE — ED Notes (Signed)
Patient requesting pain medication. This RN to room with pain medication and patient is sleeping. Will hold medication until patient wakes up.

## 2016-06-09 NOTE — ED Notes (Signed)
Spoke with Dori at [facility]. Dori states patient is good to be transported.

## 2016-06-09 NOTE — Progress Notes (Signed)
CSW faxed FL2 [phone].  Genice Rouge [id number] ED CSW 06/09/2016 3:53 PM

## 2016-06-09 NOTE — ED Notes (Signed)
When giving medications pt sts "Why didn't you bring the pain medication, I didn't ask for this?" RN told pt she was sorry but that she didn't know he had asked for any. Pt sts "What do you mean, I asked you to your face, and you lying." RN apologized again but reiterated that pt had not asked her, but would get him pain medication.

## 2016-06-09 NOTE — ED Notes (Signed)
Upon assessment of patient, patient's colostomy bag was leaking. Colostomy bag was changed. Bed linens changed and patient repositioned.

## 2016-06-09 NOTE — Progress Notes (Addendum)
CSW spoke with patient at bedside with no family present to ask if he still agreed to go to a SNF. Patient nodded his head as to say yes. CSW informed patient he would be informed when a facility was located.   CSW spoke with Santiago Glad, Admissions at [facility] regarding patient. She stated they would accept patient at their facility and she is aware that the patient has MRSA and is in need of an air mattress. CSW informed her contact would be made with EDP to ensure all needs.  Staffed case with Asst. Director of Social Work.  Staffed case with EDP and he stated patient would not need hydro therapy and that patient would need the air mattress.   Spoke with Santiago Glad, Admissions at [facility] and she stated they have the air mattress for patient and that was all that they needed for patient. FL2 was faxed and she stated it was received. Fax# [phone]  CSW called back to speak with Santiago Glad and she stated the [facility] was being reveiwed by the facility nurse. She stated CSW can inform the Nurse to call and arrange transportation for patient.   CSW informed patient that he would be going to [facility] SNF in [place]. Patient stated ok.  CSW spoke with Nurse and provided a contact number for her to call the facility regarding speaking with someone to call report.   Call received from [facility] at [facility] [phone] regarding medical questions. She also stated the [facility] and discharge summary would need to state the same regarding medications for patient. CSW directed medical questions to patient's Nurse and provided a number for her to speak with Dori.   Call received from Charge Nurse to ensure the discharge summary medication list was the same on the FL2. Charge Nurse staffed with EDP and EDP completed an addendum to the [facility] to ensure accuracy. EDP asked to resign the [facility] and Charge Nurse updated.   Spoke with Santiago Glad, Admissions at [facility] to inform her that an  addendum had been completed by the EDP to ensure accuracy with the discharge summary. CSW also informed her that the [facility] would be faxed once resigned by the EDP.    Genice Rouge [id number] ED CSW 06/09/2016 12:54 PM
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
## 2016-06-09 NOTE — ED Notes (Signed)
[facility] notified of transportation need 

## 2016-06-09 NOTE — Progress Notes (Signed)
[facility]  Active pt with [place].  [facility] hospital team will follow [name] during this admission to ensure of SNF placement and pt no longer needs home care services.   If patient discharges after hours, please call [phone].   [name] 06/09/2016, 7:37 AM

## 2016-06-21 ENCOUNTER — Encounter (HOSPITAL_BASED_OUTPATIENT_CLINIC_OR_DEPARTMENT_OTHER): Payer: Medicare Other

## 2016-07-14 ENCOUNTER — Emergency Department (HOSPITAL_COMMUNITY)
Admission: EM | Admit: 2016-07-14 | Discharge: 2016-07-14 | Disposition: A | Discharge status: Home / Self Care | Payer: Medicare Other | Attending: Emergency Medicine | Admitting: Emergency Medicine

## 2016-07-14 ENCOUNTER — Encounter (HOSPITAL_COMMUNITY): Payer: Self-pay | Admitting: Emergency Medicine

## 2016-07-14 DIAGNOSIS — G8220 Paraplegia, unspecified: Secondary | ICD-10-CM | POA: Diagnosis not present

## 2016-07-14 DIAGNOSIS — L89154 Pressure ulcer of sacral region, stage 4: Secondary | ICD-10-CM | POA: Insufficient documentation

## 2016-07-14 DIAGNOSIS — I10 Essential (primary) hypertension: Secondary | ICD-10-CM | POA: Diagnosis not present

## 2016-07-14 DIAGNOSIS — Z79899 Other long term (current) drug therapy: Secondary | ICD-10-CM | POA: Diagnosis not present

## 2016-07-14 DIAGNOSIS — Z433 Encounter for attention to colostomy: Secondary | ICD-10-CM

## 2016-07-14 DIAGNOSIS — K9400 Colostomy complication, unspecified: Secondary | ICD-10-CM | POA: Diagnosis not present

## 2016-07-14 DIAGNOSIS — L0291 Cutaneous abscess, unspecified: Secondary | ICD-10-CM | POA: Diagnosis present

## 2016-07-14 DIAGNOSIS — F17210 Nicotine dependence, cigarettes, uncomplicated: Secondary | ICD-10-CM | POA: Diagnosis not present

## 2016-07-14 MED ORDER — HYDROGEN PEROXIDE 3 % EX SOLN
CUTANEOUS | Status: AC
  Filled 2016-07-14: qty 473

## 2016-07-14 NOTE — Progress Notes (Signed)
ED Cm consulted by EDPA Abigail for pt needing DME States pt home in stool with PICC line, was at a facility recently

## 2016-07-14 NOTE — ED Notes (Signed)
IV team nurse at bedside to remove PICC line.

## 2016-07-14 NOTE — ED Triage Notes (Signed)
Patient is from home.  Patient states he was discharged from [facility] on 10-7 and states he has bed sores(chronic)  and has no colostomny bags. Patient states he has a PICC line that needs to be removed.   BP:144/60 P:102 R:18 O2:98%

## 2016-07-14 NOTE — Progress Notes (Signed)
ED CM  Consulted to assist pt with DME/home health Spoke with ED PA Abiligail Pt stated he was not seen by home health after d/c from [place] ED Cm spoke with Santiago Glad and Drue Dun at [facility] health care [phone] for collatoral information Pt was d/c from [place] [facility] home with [place] Santiago Glad was informed by the agency that the pt refused services when they went to see him and refused to have the PICC line removed Pt did well at the snf and finished all antibiotics Foley was placed to help wounds heal 1315 Cm spoke with [name] at [place] home care who confirm they visited pt but he refused services, removal of picc and informed them he would get his own home health agency Confirmed they had been notified he had completed all antibiotics and they were to assist pt with wound care  1322 ED PA abigail updated PICC line to be removed at [place] stated pt stated his [facility] for home [facility] He [facility] with [facility]  1323 Cm spoke with staff at [facility] to get fax # for clinicals  1330 Cm spoke with pt who told Cm he was not seen by home health and began to discussed his disapproval of [facility] and [facility] Confirmed preference for [facility] services CM left copy of [facility] contact info and private duty list in pt belonging bag at bedside Updated ED RN Daisy Floro in d/c instructions [place] [address] [place] [address]   Next Steps: Call on 07/14/2016  Instructions: As needed This is the agency of your choice for home care They have been contacted for starting services for you to include nurse for wound care, aide for activities of daily living and social worker for community resources + Private duty nursing list    1445 Spoke to [place] at [phone] to call in referral Coralyn Mark stated pt to be seen on Saturday but at latest on  Sunday  Faxed clinicals face sheet, ED PA notes, Home care orders with face to face)  to [phone]  Fax confirmation at [phone] received

## 2016-07-14 NOTE — Progress Notes (Signed)
EDCM spoke with ED PA abigail patient is being seen by wound care nurse for ostomy supplies for discharge home no further CM interventions

## 2016-07-14 NOTE — Progress Notes (Signed)
Consulted with [place] about ostomy supply type  Meagan confirmed ostomy type unknown related to patient would not allow services

## 2016-07-14 NOTE — ED Notes (Signed)
Patient pressures ulcers on bottom packed with wet to dry dressing, colostomy bag emptied and foley secure before patient transportation with [facility].

## 2016-07-14 NOTE — Progress Notes (Addendum)
[facility] consulted for ostomy supplies for patient.  Per day shift EDCM note, patient has been arranged home health services with [facility].  EDCM placed call into [place] [facility] to discuss ostomy supplies.  EDCM discussed patient with Hassel Neth and suggested having wound care nurse Era Bumpers see patient and supply correct ostomy supplies to patient.  Manuela Schwartz charge RN reports patient is also in need of in and out catheters.    07/14/2016 1607pm  Discussed patient with Vernie Shanks PA.  Plan to change existing foley catheter.  Agreeable to place consult for wound ostomy RN.  07/14/2016 A. Brooklinn Longbottom RNCM 1616pm EDCM placed call to [employer] wound ostomy RN.  Awaiting call back  07/14/2016 1635pm A. Jerrod Damiano RNCM.  EDCM called and left message for Coralyn Mark at [facility].  Awaiting call back.  07/14/2016 1743pm  EDCM did not receive phone call back from [facility] or wound ostomy, may be out of office.  EDCM went to patient bedside and spoke with patient.  He reports the ostomy "is new to me."  He reports he does not have any ostomy supplies at home.  Patient with new ostomy bag placed by RN.  Discussed patient with EDRN Katie who reports patient is wearing a Hollister 74mm 2 3/4 in  2 piece ostomy pouch Ref [id number].  EDCM instructed EDRN to send patient home with 5 ostomy pouches.  [facility] to see patient tomorrow per day shift RN note.  Patient agreeable to have referral placed to [facility] for ostomy supplies.  [facility] will fax ostomy supply information to [name] with [facility] information so that they may continue to assist patient.  No further EDCM needs at this time.  07/14/2016 A. Diondre Pulis RNCM 1800pm  EDCM spoke to [place] at [place] who reports to fax the following information,  Patient demographics, diagnosis or reason for supplies, doctor who will signing the orders for the supplies (pcp or specialist).  Phone number to Denzil Hughes [phone], fax [phone].  EDCM will place this information on  patient's AVS.    07/14/2016 A. Julius Boniface RNCM 1848pm  [facility] faxed ostomy supply referral to [facility] and [place] with confirmation of receipt.  Updated patient.  No further EDCM needs at this time.

## 2016-07-14 NOTE — Progress Notes (Signed)
Spoke with ED SW who confirms pt was assisted to [place] care snf on 06/09/16 from ED Cm called to try to get collatarol information from [place] Santiago Glad at [place] care Voice message left with CM mobile number

## 2016-07-14 NOTE — Discharge Instructions (Signed)
Return to the emergency department for any new or worsening symptoms including fevers, chill, foul odor from decubitus ulcers, or any other concerns for infection.

## 2016-07-14 NOTE — ED Notes (Signed)
Patient is A & O x4.  He understood his discharge instructions.
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
## 2016-07-17 ENCOUNTER — Telehealth: Payer: Self-pay | Admitting: *Deleted

## 2016-07-19 ENCOUNTER — Inpatient Hospital Stay (HOSPITAL_COMMUNITY): Payer: Medicare Other

## 2016-07-19 ENCOUNTER — Inpatient Hospital Stay (HOSPITAL_COMMUNITY)
Admission: EM | Admit: 2016-07-19 | Discharge: 2016-07-22 | DRG: 592 | Disposition: A | Discharge status: Home Health | Payer: Medicare Other | Attending: Internal Medicine | Admitting: Internal Medicine

## 2016-07-19 ENCOUNTER — Emergency Department (HOSPITAL_COMMUNITY): Payer: Medicare Other

## 2016-07-19 ENCOUNTER — Inpatient Hospital Stay: Payer: Medicare Other | Admitting: Infectious Diseases

## 2016-07-19 ENCOUNTER — Encounter (HOSPITAL_COMMUNITY): Payer: Self-pay | Admitting: Emergency Medicine

## 2016-07-19 DIAGNOSIS — I10 Essential (primary) hypertension: Secondary | ICD-10-CM | POA: Diagnosis present

## 2016-07-19 DIAGNOSIS — G8250 Quadriplegia, unspecified: Secondary | ICD-10-CM | POA: Diagnosis present

## 2016-07-19 DIAGNOSIS — T83511A Infection and inflammatory reaction due to indwelling urethral catheter, initial encounter: Secondary | ICD-10-CM | POA: Diagnosis present

## 2016-07-19 DIAGNOSIS — K219 Gastro-esophageal reflux disease without esophagitis: Secondary | ICD-10-CM | POA: Diagnosis present

## 2016-07-19 DIAGNOSIS — A0472 Enterocolitis due to Clostridium difficile, not specified as recurrent: Secondary | ICD-10-CM | POA: Diagnosis present

## 2016-07-19 DIAGNOSIS — K5100 Ulcerative (chronic) pancolitis without complications: Secondary | ICD-10-CM | POA: Diagnosis not present

## 2016-07-19 DIAGNOSIS — B952 Enterococcus as the cause of diseases classified elsewhere: Secondary | ICD-10-CM | POA: Diagnosis not present

## 2016-07-19 DIAGNOSIS — N3090 Cystitis, unspecified without hematuria: Secondary | ICD-10-CM | POA: Diagnosis present

## 2016-07-19 DIAGNOSIS — Y846 Urinary catheterization as the cause of abnormal reaction of the patient, or of later complication, without mention of misadventure at the time of the procedure: Secondary | ICD-10-CM | POA: Diagnosis present

## 2016-07-19 DIAGNOSIS — R1032 Left lower quadrant pain: Secondary | ICD-10-CM

## 2016-07-19 DIAGNOSIS — W3400XS Accidental discharge from unspecified firearms or gun, sequela: Secondary | ICD-10-CM | POA: Diagnosis not present

## 2016-07-19 DIAGNOSIS — M869 Osteomyelitis, unspecified: Secondary | ICD-10-CM

## 2016-07-19 DIAGNOSIS — Z933 Colostomy status: Secondary | ICD-10-CM

## 2016-07-19 DIAGNOSIS — E876 Hypokalemia: Secondary | ICD-10-CM | POA: Diagnosis present

## 2016-07-19 DIAGNOSIS — G8220 Paraplegia, unspecified: Secondary | ICD-10-CM | POA: Diagnosis present

## 2016-07-19 DIAGNOSIS — N390 Urinary tract infection, site not specified: Secondary | ICD-10-CM

## 2016-07-19 DIAGNOSIS — F1410 Cocaine abuse, uncomplicated: Secondary | ICD-10-CM | POA: Diagnosis not present

## 2016-07-19 DIAGNOSIS — Z79899 Other long term (current) drug therapy: Secondary | ICD-10-CM

## 2016-07-19 DIAGNOSIS — F17210 Nicotine dependence, cigarettes, uncomplicated: Secondary | ICD-10-CM | POA: Diagnosis present

## 2016-07-19 DIAGNOSIS — R1030 Lower abdominal pain, unspecified: Secondary | ICD-10-CM | POA: Diagnosis not present

## 2016-07-19 DIAGNOSIS — L8990 Pressure ulcer of unspecified site, unspecified stage: Secondary | ICD-10-CM | POA: Diagnosis present

## 2016-07-19 DIAGNOSIS — Z841 Family history of disorders of kidney and ureter: Secondary | ICD-10-CM

## 2016-07-19 DIAGNOSIS — L89154 Pressure ulcer of sacral region, stage 4: Secondary | ICD-10-CM | POA: Diagnosis present

## 2016-07-19 DIAGNOSIS — M4628 Osteomyelitis of vertebra, sacral and sacrococcygeal region: Secondary | ICD-10-CM | POA: Diagnosis not present

## 2016-07-19 DIAGNOSIS — L89319 Pressure ulcer of right buttock, unspecified stage: Secondary | ICD-10-CM | POA: Diagnosis not present

## 2016-07-19 DIAGNOSIS — G8929 Other chronic pain: Secondary | ICD-10-CM | POA: Diagnosis present

## 2016-07-19 DIAGNOSIS — E43 Unspecified severe protein-calorie malnutrition: Secondary | ICD-10-CM | POA: Diagnosis present

## 2016-07-19 DIAGNOSIS — R109 Unspecified abdominal pain: Secondary | ICD-10-CM

## 2016-07-19 LAB — URINE MICROSCOPIC-ADD ON

## 2016-07-19 LAB — CBC
HEMATOCRIT: 34 % — AB (ref 39.0–52.0)
HEMOGLOBIN: 10.9 g/dL — AB (ref 13.0–17.0)
MCH: 27.9 pg (ref 26.0–34.0)
MCHC: 32.1 g/dL (ref 30.0–36.0)
MCV: 87.2 fL (ref 78.0–100.0)
Platelets: 400 10*3/uL (ref 150–400)
RBC: 3.9 MIL/uL — ABNORMAL LOW (ref 4.22–5.81)
RDW: 14.6 % (ref 11.5–15.5)
WBC: 10.8 10*3/uL — ABNORMAL HIGH (ref 4.0–10.5)

## 2016-07-19 LAB — COMPREHENSIVE METABOLIC PANEL
ALBUMIN: 2.5 g/dL — AB (ref 3.5–5.0)
ALT: 10 U/L — ABNORMAL LOW (ref 17–63)
ANION GAP: 9 (ref 5–15)
AST: 11 U/L — AB (ref 15–41)
Alkaline Phosphatase: 51 U/L (ref 38–126)
BUN: 7 mg/dL (ref 6–20)
CHLORIDE: 106 mmol/L (ref 101–111)
CO2: 27 mmol/L (ref 22–32)
Calcium: 6.5 mg/dL — ABNORMAL LOW (ref 8.9–10.3)
Creatinine, Ser: 0.72 mg/dL (ref 0.61–1.24)
GFR calc Af Amer: >60 mL/min (ref >60)
GFR calc non Af Amer: >60 mL/min (ref >60)
GLUCOSE: 95 mg/dL (ref 65–99)
POTASSIUM: 2.4 mmol/L — AB (ref 3.5–5.1)
SODIUM: 142 mmol/L (ref 135–145)
TOTAL PROTEIN: 6.9 g/dL (ref 6.5–8.1)
Total Bilirubin: 0.5 mg/dL (ref 0.3–1.2)

## 2016-07-19 LAB — RAPID URINE DRUG SCREEN, HOSP PERFORMED
Amphetamines: NOT DETECTED
BARBITURATES: NOT DETECTED
BENZODIAZEPINES: NOT DETECTED
Cocaine: POSITIVE — AB
Opiates: POSITIVE — AB
Tetrahydrocannabinol: NOT DETECTED

## 2016-07-19 LAB — LACTIC ACID, PLASMA: Lactic Acid, Venous: 0.8 mmol/L (ref 0.5–1.9)

## 2016-07-19 LAB — URINALYSIS, ROUTINE W REFLEX MICROSCOPIC
BILIRUBIN URINE: NEGATIVE
GLUCOSE, UA: NEGATIVE mg/dL
Ketones, ur: NEGATIVE mg/dL
NITRITE: NEGATIVE
PH: 6 (ref 5.0–8.0)
Protein, ur: NEGATIVE mg/dL
SPECIFIC GRAVITY, URINE: 1.015 (ref 1.005–1.030)

## 2016-07-19 LAB — MAGNESIUM: MAGNESIUM: 0.8 mg/dL — AB (ref 1.7–2.4)

## 2016-07-19 LAB — LIPASE, BLOOD: LIPASE: 49 U/L (ref 11–51)

## 2016-07-19 LAB — MRSA PCR SCREENING: MRSA by PCR: NEGATIVE

## 2016-07-19 MED ORDER — MORPHINE SULFATE (PF) 4 MG/ML IV SOLN
4.0000 mg | Freq: Once | INTRAVENOUS | Status: AC
  Administered 2016-07-19: 4 mg via INTRAVENOUS
  Filled 2016-07-19: qty 1

## 2016-07-19 MED ORDER — ONDANSETRON HCL 4 MG/2ML IJ SOLN
4.0000 mg | Freq: Once | INTRAMUSCULAR | Status: AC
  Administered 2016-07-19: 4 mg via INTRAVENOUS
  Filled 2016-07-19: qty 2

## 2016-07-19 MED ORDER — ACETAMINOPHEN 650 MG RE SUPP: 650.0000 mg | Freq: Four times a day (QID) | RECTAL | Status: DC | PRN

## 2016-07-19 MED ORDER — SODIUM CHLORIDE 0.9% FLUSH: 3.0000 mL | Freq: Two times a day (BID) | INTRAVENOUS | Status: DC

## 2016-07-19 MED ORDER — METRONIDAZOLE IN NACL 5-0.79 MG/ML-% IV SOLN
500.0000 mg | Freq: Three times a day (TID) | INTRAVENOUS | Status: DC
  Administered 2016-07-20 (×2): 500 mg via INTRAVENOUS
  Filled 2016-07-19 (×3): qty 100

## 2016-07-19 MED ORDER — SODIUM CHLORIDE 0.9 % IV BOLUS (SEPSIS)
1000.0000 mL | Freq: Once | INTRAVENOUS | Status: AC
  Administered 2016-07-19: 1000 mL via INTRAVENOUS

## 2016-07-19 MED ORDER — ONDANSETRON HCL 4 MG PO TABS: 4.0000 mg | ORAL_TABLET | Freq: Four times a day (QID) | ORAL | Status: DC | PRN

## 2016-07-19 MED ORDER — DAKINS (1/4 STRENGTH) 0.125 % EX SOLN
Freq: Two times a day (BID) | CUTANEOUS | Status: AC
  Administered 2016-07-19 – 2016-07-21 (×3): via TOPICAL
  Filled 2016-07-19 (×3): qty 473

## 2016-07-19 MED ORDER — VANCOMYCIN HCL IN DEXTROSE 1-5 GM/200ML-% IV SOLN
1000.0000 mg | Freq: Two times a day (BID) | INTRAVENOUS | Status: DC
  Administered 2016-07-20: 1000 mg via INTRAVENOUS
  Filled 2016-07-19: qty 200

## 2016-07-19 MED ORDER — MAGNESIUM SULFATE 50 % IJ SOLN: 2.0000 g | Freq: Once | INTRAMUSCULAR | Status: DC

## 2016-07-19 MED ORDER — FAMOTIDINE 20 MG PO TABS
20.0000 mg | ORAL_TABLET | Freq: Two times a day (BID) | ORAL | Status: DC
  Administered 2016-07-19 – 2016-07-22 (×6): 20 mg via ORAL
  Filled 2016-07-19 (×6): qty 1

## 2016-07-19 MED ORDER — BENAZEPRIL HCL 10 MG PO TABS
20.0000 mg | ORAL_TABLET | Freq: Every day | ORAL | Status: DC
  Administered 2016-07-19 – 2016-07-22 (×4): 20 mg via ORAL
  Filled 2016-07-19 (×4): qty 2

## 2016-07-19 MED ORDER — SODIUM CHLORIDE 0.9 % IV SOLN
INTRAVENOUS | Status: DC
  Administered 2016-07-19 – 2016-07-20 (×3): via INTRAVENOUS

## 2016-07-19 MED ORDER — AMLODIPINE BESYLATE 10 MG PO TABS
10.0000 mg | ORAL_TABLET | Freq: Every day | ORAL | Status: DC
  Administered 2016-07-19 – 2016-07-22 (×4): 10 mg via ORAL
  Filled 2016-07-19 (×4): qty 1

## 2016-07-19 MED ORDER — IOPAMIDOL (ISOVUE-300) INJECTION 61%
30.0000 mL | Freq: Once | INTRAVENOUS | Status: AC | PRN
  Administered 2016-07-19: 30 mL via ORAL

## 2016-07-19 MED ORDER — VANCOMYCIN HCL 10 G IV SOLR
2000.0000 mg | Freq: Once | INTRAVENOUS | Status: AC
  Administered 2016-07-19: 2000 mg via INTRAVENOUS
  Filled 2016-07-19: qty 2000

## 2016-07-19 MED ORDER — ONDANSETRON HCL 4 MG/2ML IJ SOLN: 4.0000 mg | Freq: Four times a day (QID) | INTRAMUSCULAR | Status: DC | PRN

## 2016-07-19 MED ORDER — PREGABALIN 50 MG PO CAPS
50.0000 mg | ORAL_CAPSULE | Freq: Two times a day (BID) | ORAL | Status: DC
  Administered 2016-07-19 – 2016-07-22 (×7): 50 mg via ORAL
  Filled 2016-07-19 (×7): qty 1

## 2016-07-19 MED ORDER — CIPROFLOXACIN IN D5W 400 MG/200ML IV SOLN
400.0000 mg | Freq: Two times a day (BID) | INTRAVENOUS | Status: DC
  Administered 2016-07-19 – 2016-07-20 (×2): 400 mg via INTRAVENOUS
  Filled 2016-07-19 (×3): qty 200

## 2016-07-19 MED ORDER — OXYCODONE HCL 5 MG PO TABS
15.0000 mg | ORAL_TABLET | Freq: Four times a day (QID) | ORAL | Status: DC
  Administered 2016-07-19 – 2016-07-22 (×12): 15 mg via ORAL
  Filled 2016-07-19 (×12): qty 3

## 2016-07-19 MED ORDER — COLLAGENASE 250 UNIT/GM EX OINT
TOPICAL_OINTMENT | Freq: Every day | CUTANEOUS | Status: DC
  Administered 2016-07-19 – 2016-07-22 (×4): via TOPICAL
  Filled 2016-07-19 (×2): qty 30

## 2016-07-19 MED ORDER — ACETAMINOPHEN 500 MG PO TABS
1000.0000 mg | ORAL_TABLET | Freq: Once | ORAL | Status: AC
  Administered 2016-07-19: 1000 mg via ORAL
  Filled 2016-07-19: qty 2

## 2016-07-19 MED ORDER — POTASSIUM CHLORIDE CRYS ER 20 MEQ PO TBCR
40.0000 meq | EXTENDED_RELEASE_TABLET | Freq: Once | ORAL | Status: AC
  Administered 2016-07-19: 40 meq via ORAL
  Filled 2016-07-19: qty 2

## 2016-07-19 MED ORDER — HYDROMORPHONE HCL 1 MG/ML IJ SOLN
1.0000 mg | Freq: Once | INTRAMUSCULAR | Status: AC
  Administered 2016-07-19: 1 mg via INTRAVENOUS
  Filled 2016-07-19: qty 1

## 2016-07-19 MED ORDER — IOPAMIDOL (ISOVUE-300) INJECTION 61%: 15.0000 mL | Freq: Once | INTRAVENOUS | Status: DC | PRN

## 2016-07-19 MED ORDER — ENOXAPARIN SODIUM 60 MG/0.6ML ~~LOC~~ SOLN
60.0000 mg | SUBCUTANEOUS | Status: DC
Start: 2016-07-19 — End: 2016-07-21
  Administered 2016-07-19 – 2016-07-20 (×2): 60 mg via SUBCUTANEOUS
  Filled 2016-07-19 (×2): qty 0.6

## 2016-07-19 MED ORDER — DEXTROSE 5 % IV SOLN
1.0000 g | Freq: Once | INTRAVENOUS | Status: DC
  Administered 2016-07-19: 1 g via INTRAVENOUS
  Filled 2016-07-19: qty 10

## 2016-07-19 MED ORDER — IOPAMIDOL (ISOVUE-300) INJECTION 61%
100.0000 mL | Freq: Once | INTRAVENOUS | Status: AC | PRN
  Administered 2016-07-19: 100 mL via INTRAVENOUS

## 2016-07-19 MED ORDER — TRAMADOL HCL 50 MG PO TABS
50.0000 mg | ORAL_TABLET | Freq: Four times a day (QID) | ORAL | Status: DC | PRN
  Administered 2016-07-19: 50 mg via ORAL
  Filled 2016-07-19 (×2): qty 1

## 2016-07-19 MED ORDER — MAGNESIUM SULFATE 2 GM/50ML IV SOLN
2.0000 g | Freq: Once | INTRAVENOUS | Status: AC
  Administered 2016-07-19: 2 g via INTRAVENOUS
  Filled 2016-07-19: qty 50

## 2016-07-19 MED ORDER — ACETAMINOPHEN 325 MG PO TABS
650.0000 mg | ORAL_TABLET | Freq: Four times a day (QID) | ORAL | Status: DC | PRN
Start: 2016-07-19 — End: 2016-07-22

## 2016-07-19 MED ORDER — PIPERACILLIN-TAZOBACTAM 3.375 G IVPB
3.3750 g | Freq: Once | INTRAVENOUS | Status: AC
  Administered 2016-07-19: 3.375 g via INTRAVENOUS
  Filled 2016-07-19: qty 50

## 2016-07-19 MED ORDER — AMLODIPINE BESY-BENAZEPRIL HCL 10-20 MG PO CAPS: 1.0000 | ORAL_CAPSULE | Freq: Every day | ORAL | Status: DC

## 2016-07-19 MED ORDER — POTASSIUM CHLORIDE 10 MEQ/100ML IV SOLN
10.0000 meq | Freq: Once | INTRAVENOUS | Status: AC
  Administered 2016-07-19: 10 meq via INTRAVENOUS
  Filled 2016-07-19: qty 100

## 2016-07-19 NOTE — ED Triage Notes (Signed)
Pt has abdominal pain since last night via [facility].  Pt woke up his morning and his colostomy bag had detached from his body. Reports vomiting this am and intermittent abd pain. Pt has a urinary catheter. Pt's colostomy covered with gauze by EMS.  Pt is a paraplegic from [place]. Vitals= 145/77, 86 bpm, 16 RR

## 2016-07-19 NOTE — Progress Notes (Signed)
Fax confirmation received. 

## 2016-07-19 NOTE — Progress Notes (Addendum)
Entered in d/c instructions [place] [phone] [place] [id number]   Next Steps: Call on 07/19/2016  Instructions: as needed This is the agency of your choice for home care They have been contacted for starting services for you to include nurse for wound care, aide for activities of daily living and social worker for community resources   medicaid [place] access covered patient     [facility] Transportation to Dr appts if you are have full Medicaid: [phone], [place] [phone]  Call 3 days prior to your appt   Instructions: [place]: [id number] [place], [place] [address] [email or web address] Use this website to assist with understanding your coverage & to renew application  [facility] [phone] [address], [place] [address]  [place] [address]   Next Steps: Go on 07/25/2016  Instructions: You have a scheduled appointment with Dr Linton Ham 07/25/16 at 1230

## 2016-07-19 NOTE — Progress Notes (Addendum)
   07/19/16 0000  CM Assessment  Expected Discharge [facility]  In-house Referral NA  Discharge Planning Services CM Consult  [facility]  Choice offered to / list presented to  Patient  DME Arranged N/A  DME Agency NA  [place] Arranged RN;Social Work;Nurse's [facility]  Status of Service Completed, signed off  Discharge Disposition Home w [facility]    1226 CM called [facility] at [phone] and spoke with courtney who confirms pt is active for home services [place] confirmed today that initially RN nurse on 14th multiple messages to start care  Transferred me to [facility] the scheduler who state's pt's nurse is trying get orders for pt Spoke withTina (RN who did admission) who confirms she needs specific wound management orders, reports at this time she has only been using wet to dry and ABD pad (their protocol) she is concern for "osteomylitis", states in comparing pt wound notes the wounds have gotten larger, states wound vac no working, Confirms pt was d/c from [facility] on 07/08/16 a Saturday after IV antibiotics completed,  Otila Kluver left her number as [phone] CM shared with Otila Kluver that pt does have an upcoming wound care center appt on 07/25/16 at `1230 with Dr Linton Ham [phone] Otila Kluver states pt has an IT trainer w/c at home and he used it on 07/17/16 to go to a 1430 appt after she left  Him Pt states pt is trying to get someone to come assist him with fixing his w/c also - Cm shared with her that the pt can get medical appt transportation from [place] by [facility] [place] confirmed [facility] will be assisting pt with his colostomy supplies and did get all information faxed from [facility] ED on 1013/17 r/t [place], colostomy supplies and home care orders 1247 EDP updated- informed pt being seen 3 times a week by [facility] and shared the plans of the [facility], Otila Kluver 1300 spoke with melanie wound care RN (after pageing [phone]  and leaving a voice msg at [phone]) to get assist with orders for this pt Refer to wound care Rn note

## 2016-07-19 NOTE — ED Notes (Signed)
Pt has been cleaned and colostomy bag is being replaced by nurse
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
## 2016-07-19 NOTE — ED Notes (Signed)
Note: Pt. Arrives here with foley cath. Which Dr. Gilford Raid orders [place] to change. Will do this shortly.

## 2016-07-19 NOTE — ED Notes (Signed)
K+ 2.4 Mg+ 0.8
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
## 2016-07-19 NOTE — ED Notes (Signed)
Pt not a candidate for rectal temps d/t redundant rectal pouch.  MD made aware.

## 2016-07-19 NOTE — ED Notes (Signed)
Patient transported to X-ray 

## 2016-07-19 NOTE — Progress Notes (Signed)
Attempt to call [name] well care [facility] to update her that pt is being admitted Unable to leave a voice message, no mail box set up  Faxed clinicals to include facesheet ostomy & wound care orders plus H&P

## 2016-07-19 NOTE — ED Notes (Signed)
Lab delay - pt in xray
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
## 2016-07-19 NOTE — ED Notes (Signed)
Dr. Wynelle Cleveland has been in and viewed his debubiti, one at right ischial area and one at sacral area; both of which are packed with wet-to-dry dressings.
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
## 2016-07-19 NOTE — ED Notes (Signed)
Pt back from xray -  States he is in too much pain right now - refusing labs until he gets pain meds.  RN aware.

## 2016-07-19 NOTE — ED Notes (Signed)
Pt still in x-ray

## 2016-07-20 DIAGNOSIS — N390 Urinary tract infection, site not specified: Secondary | ICD-10-CM

## 2016-07-20 DIAGNOSIS — F1410 Cocaine abuse, uncomplicated: Secondary | ICD-10-CM

## 2016-07-20 DIAGNOSIS — A0472 Enterocolitis due to Clostridium difficile, not specified as recurrent: Secondary | ICD-10-CM

## 2016-07-20 DIAGNOSIS — M4628 Osteomyelitis of vertebra, sacral and sacrococcygeal region: Secondary | ICD-10-CM

## 2016-07-20 DIAGNOSIS — T83511A Infection and inflammatory reaction due to indwelling urethral catheter, initial encounter: Secondary | ICD-10-CM

## 2016-07-20 DIAGNOSIS — L89154 Pressure ulcer of sacral region, stage 4: Secondary | ICD-10-CM

## 2016-07-20 LAB — BLOOD CULTURE ID PANEL (REFLEXED)
ACINETOBACTER BAUMANNII: NOT DETECTED
CANDIDA ALBICANS: NOT DETECTED
CANDIDA GLABRATA: NOT DETECTED
Candida krusei: NOT DETECTED
Candida parapsilosis: NOT DETECTED
Candida tropicalis: NOT DETECTED
ENTEROBACTER CLOACAE COMPLEX: NOT DETECTED
ENTEROBACTERIACEAE SPECIES: NOT DETECTED
ENTEROCOCCUS SPECIES: DETECTED — AB
Escherichia coli: NOT DETECTED
Haemophilus influenzae: NOT DETECTED
Klebsiella oxytoca: NOT DETECTED
Klebsiella pneumoniae: NOT DETECTED
LISTERIA MONOCYTOGENES: NOT DETECTED
NEISSERIA MENINGITIDIS: NOT DETECTED
PROTEUS SPECIES: NOT DETECTED
PSEUDOMONAS AERUGINOSA: NOT DETECTED
STREPTOCOCCUS AGALACTIAE: NOT DETECTED
STREPTOCOCCUS PNEUMONIAE: NOT DETECTED
STREPTOCOCCUS SPECIES: NOT DETECTED
Serratia marcescens: NOT DETECTED
Staphylococcus aureus (BCID): NOT DETECTED
Staphylococcus species: NOT DETECTED
Streptococcus pyogenes: NOT DETECTED
VANCOMYCIN RESISTANCE: NOT DETECTED

## 2016-07-20 LAB — GASTROINTESTINAL PANEL BY PCR, STOOL (REPLACES STOOL CULTURE)

## 2016-07-20 LAB — C DIFFICILE QUICK SCREEN W PCR REFLEX
C Diff antigen: POSITIVE — AB
C Diff toxin: NEGATIVE

## 2016-07-20 LAB — CBC
HEMATOCRIT: 32.6 % — AB (ref 39.0–52.0)
HEMOGLOBIN: 10.3 g/dL — AB (ref 13.0–17.0)
MCH: 27.5 pg (ref 26.0–34.0)
MCHC: 31.6 g/dL (ref 30.0–36.0)
MCV: 86.9 fL (ref 78.0–100.0)
Platelets: 451 10*3/uL — ABNORMAL HIGH (ref 150–400)
RBC: 3.75 MIL/uL — AB (ref 4.22–5.81)
RDW: 14.7 % (ref 11.5–15.5)
WBC: 9.5 10*3/uL (ref 4.0–10.5)

## 2016-07-20 LAB — BASIC METABOLIC PANEL
ANION GAP: 7 (ref 5–15)
CALCIUM: 6.6 mg/dL — AB (ref 8.9–10.3)
CO2: 23 mmol/L (ref 22–32)
Chloride: 110 mmol/L (ref 101–111)
Creatinine, Ser: 0.74 mg/dL (ref 0.61–1.24)
GFR calc Af Amer: >60 mL/min (ref >60)
GFR calc non Af Amer: >60 mL/min (ref >60)
GLUCOSE: 117 mg/dL — AB (ref 65–99)
Potassium: 3.2 mmol/L — ABNORMAL LOW (ref 3.5–5.1)
Sodium: 140 mmol/L (ref 135–145)

## 2016-07-20 LAB — URINE CULTURE

## 2016-07-20 LAB — COMPREHENSIVE METABOLIC PANEL
ALBUMIN: 2.3 g/dL — AB (ref 3.5–5.0)
ALK PHOS: 48 U/L (ref 38–126)
ALT: 7 U/L — ABNORMAL LOW (ref 17–63)
ANION GAP: 9 (ref 5–15)
AST: 8 U/L — ABNORMAL LOW (ref 15–41)
BUN: <5 mg/dL — ABNORMAL LOW (ref 6–20)
CALCIUM: 6.4 mg/dL — AB (ref 8.9–10.3)
CO2: 22 mmol/L (ref 22–32)
Chloride: 109 mmol/L (ref 101–111)
Creatinine, Ser: 0.7 mg/dL (ref 0.61–1.24)
GFR calc non Af Amer: >60 mL/min (ref >60)
GLUCOSE: 106 mg/dL — AB (ref 65–99)
POTASSIUM: 2.2 mmol/L — AB (ref 3.5–5.1)
SODIUM: 140 mmol/L (ref 135–145)
TOTAL PROTEIN: 6.2 g/dL — AB (ref 6.5–8.1)
Total Bilirubin: 0.6 mg/dL (ref 0.3–1.2)

## 2016-07-20 LAB — CLOSTRIDIUM DIFFICILE BY PCR: Toxigenic C. Difficile by PCR: POSITIVE — AB

## 2016-07-20 LAB — MAGNESIUM: MAGNESIUM: 1.1 mg/dL — AB (ref 1.7–2.4)

## 2016-07-20 MED ORDER — SODIUM CHLORIDE 0.9 % IV SOLN
1.0000 g | Freq: Once | INTRAVENOUS | Status: AC
  Administered 2016-07-20: 1 g via INTRAVENOUS
  Filled 2016-07-20: qty 10

## 2016-07-20 MED ORDER — POTASSIUM CHLORIDE 10 MEQ/100ML IV SOLN
10.0000 meq | INTRAVENOUS | Status: AC
  Administered 2016-07-20 (×3): 10 meq via INTRAVENOUS
  Filled 2016-07-20 (×2): qty 100

## 2016-07-20 MED ORDER — POTASSIUM CHLORIDE 10 MEQ/100ML IV SOLN: 10.0000 meq | INTRAVENOUS | Status: DC

## 2016-07-20 MED ORDER — MAGNESIUM SULFATE 4 GM/100ML IV SOLN
4.0000 g | Freq: Once | INTRAVENOUS | Status: AC
  Administered 2016-07-20: 4 g via INTRAVENOUS
  Filled 2016-07-20: qty 100

## 2016-07-20 MED ORDER — POTASSIUM CHLORIDE CRYS ER 20 MEQ PO TBCR
40.0000 meq | EXTENDED_RELEASE_TABLET | ORAL | Status: AC
  Administered 2016-07-20 (×2): 40 meq via ORAL
  Filled 2016-07-20 (×2): qty 2

## 2016-07-20 MED ORDER — VANCOMYCIN 50 MG/ML ORAL SOLUTION
125.0000 mg | Freq: Four times a day (QID) | ORAL | Status: DC
  Filled 2016-07-20: qty 2.5

## 2016-07-20 MED ORDER — SODIUM CHLORIDE 0.9 % IV SOLN
3.0000 g | Freq: Four times a day (QID) | INTRAVENOUS | Status: DC
  Administered 2016-07-20 – 2016-07-21 (×4): 3 g via INTRAVENOUS
  Filled 2016-07-20 (×4): qty 3

## 2016-07-20 MED ORDER — SODIUM CHLORIDE 0.9 % IV SOLN
2.0000 g | INTRAVENOUS | Status: DC
  Administered 2016-07-20: 2 g via INTRAVENOUS
  Filled 2016-07-20 (×3): qty 2000

## 2016-07-20 MED ORDER — SODIUM CHLORIDE 0.9 % IV SOLN
INTRAVENOUS | Status: DC
  Administered 2016-07-20 – 2016-07-21 (×2): via INTRAVENOUS
  Filled 2016-07-20 (×3): qty 1000

## 2016-07-20 NOTE — Care Management Note (Signed)
Case Management Note  Patient Details  Name: Larell Kulpa MRN: [id number] Date of Birth: [birth year]  Subjective/Objective: 50 y/o m admitted w/abd pain. From home.Noted ED CM has tried to reach [facility].Await-HHRN/SW aide.                    Action/Plan:d/c home w/HHC.   Expected Discharge Date:   (unknown)               Expected Discharge Plan:  [place]  In-House Referral:  NA  Discharge planning Services  CM Consult  Post Acute Care Choice:  Home Health Choice offered to:  Patient  DME Arranged:  N/A DME Agency:  NA  HH Arranged:  RN, Social Work, Nurse's Aide [place] Agency:  [facility]  Status of Service:  Completed, signed off  If discussed at [name], dates discussed:    Additional Comments:  Dessa Phi, RN 07/20/2016, 4:11 PM
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
## 2016-07-20 NOTE — Progress Notes (Signed)
Attempt to call [name] well care [facility] [phone] to update her that pt is being admitted Unable to leave a voice message, no mail box set up
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
## 2016-07-20 NOTE — Progress Notes (Signed)
PHARMACY - PHYSICIAN COMMUNICATION CRITICAL VALUE ALERT - BLOOD CULTURE IDENTIFICATION (BCID)  Results for orders placed or performed during the hospital encounter of 07/19/16  Blood Culture ID Panel (Reflexed) (Collected: 07/19/2016 10:56 AM)  Result Value Ref Range   Enterococcus species DETECTED (A) NOT DETECTED   Vancomycin resistance NOT DETECTED NOT DETECTED   Listeria monocytogenes NOT DETECTED NOT DETECTED   Staphylococcus species NOT DETECTED NOT DETECTED   Staphylococcus aureus NOT DETECTED NOT DETECTED   Streptococcus species NOT DETECTED NOT DETECTED   Streptococcus agalactiae NOT DETECTED NOT DETECTED   Streptococcus pneumoniae NOT DETECTED NOT DETECTED   Streptococcus pyogenes NOT DETECTED NOT DETECTED   Acinetobacter baumannii NOT DETECTED NOT DETECTED   Enterobacteriaceae species NOT DETECTED NOT DETECTED   Enterobacter cloacae complex NOT DETECTED NOT DETECTED   Escherichia coli NOT DETECTED NOT DETECTED   Klebsiella oxytoca NOT DETECTED NOT DETECTED   Klebsiella pneumoniae NOT DETECTED NOT DETECTED   Proteus species NOT DETECTED NOT DETECTED   Serratia marcescens NOT DETECTED NOT DETECTED   Haemophilus influenzae NOT DETECTED NOT DETECTED   Neisseria meningitidis NOT DETECTED NOT DETECTED   Pseudomonas aeruginosa NOT DETECTED NOT DETECTED   Candida albicans NOT DETECTED NOT DETECTED   Candida glabrata NOT DETECTED NOT DETECTED   Candida krusei NOT DETECTED NOT DETECTED   Candida parapsilosis NOT DETECTED NOT DETECTED   Candida tropicalis NOT DETECTED NOT DETECTED    Name of physician (or Provider) Contacted: [place]  Changes to prescribed antibiotics required: DC vancomycin, start Ampicillin 2g IV q4h  Peggyann Juba, PharmD, BCPS Pager: [phone] 07/20/2016  10:46 AM

## 2016-07-20 NOTE — Progress Notes (Signed)
CRITICAL VALUE ALERT  Critical value received:  K+ 2.2    Ca++ 6.4  Date of notification:  07/20/16  Time of notification:  0855  Critical value read back:Yes.    Nurse who received alert:  Catie Conley Canal  MD notified (1st page):  Dr Clementeen Graham  Time of first page:  [phone]  MD notified (2nd page):  Time of second page:  Responding MD:  Dr. Clementeen Graham  Time MD responded:  [phone]

## 2016-07-20 NOTE — Consult Note (Addendum)
[facility] Nurse wound consult note Reason for Consult: Full thickness pressure injury to sacrum and right ischium.  Patient refuses assessment today.  MD evaluated wounds yesterday.  Foul smelling effluent was noted.  Orders written for Santyl and Dakin's solution.  Will continue this plan of care.   Wound type:Full thickness pressure injury.  Pressure Ulcer POA: Yes Measurement:Refused assessment  Drainage (amount, consistency, odor) Moderate foul smelling effluent.  Periwound:not assessed Dressing procedure/placement/frequency:Dakin's and Santyl as ordered  yesterday.   [facility] Nurse ostomy consult note Stoma type/location: LLQ Colostomy, leaking liquid  Stomal assessment/size: 1 1/2" pink patent and producing liquid, mucus filled effluent Peristomal assessment: Intact Treatment options for stomal/peristomal skin: 1 piece flat and barrier ring for maximum flexibility to improve wear time.  Output Liquid Ostomy pouching: 1pc.flat with barrier ring Education provided: None Enrolled patient in [place] program: N/A  Will not follow.  Please reconsult if needed.   [name] RN BSN [place] Pager [phone]

## 2016-07-21 DIAGNOSIS — A0472 Enterocolitis due to Clostridium difficile, not specified as recurrent: Secondary | ICD-10-CM

## 2016-07-21 DIAGNOSIS — B952 Enterococcus as the cause of diseases classified elsewhere: Secondary | ICD-10-CM

## 2016-07-21 DIAGNOSIS — Z809 Family history of malignant neoplasm, unspecified: Secondary | ICD-10-CM

## 2016-07-21 DIAGNOSIS — L89319 Pressure ulcer of right buttock, unspecified stage: Secondary | ICD-10-CM

## 2016-07-21 DIAGNOSIS — Z842 Family history of other diseases of the genitourinary system: Secondary | ICD-10-CM

## 2016-07-21 DIAGNOSIS — F17210 Nicotine dependence, cigarettes, uncomplicated: Secondary | ICD-10-CM

## 2016-07-21 DIAGNOSIS — L89154 Pressure ulcer of sacral region, stage 4: Principal | ICD-10-CM

## 2016-07-21 DIAGNOSIS — R1030 Lower abdominal pain, unspecified: Secondary | ICD-10-CM

## 2016-07-21 DIAGNOSIS — K5100 Ulcerative (chronic) pancolitis without complications: Secondary | ICD-10-CM

## 2016-07-21 DIAGNOSIS — Z221 Carrier of other intestinal infectious diseases: Secondary | ICD-10-CM

## 2016-07-21 LAB — BASIC METABOLIC PANEL
Anion gap: 5 (ref 5–15)
CHLORIDE: 114 mmol/L — AB (ref 101–111)
CO2: 25 mmol/L (ref 22–32)
CREATININE: 0.65 mg/dL (ref 0.61–1.24)
Calcium: 7 mg/dL — ABNORMAL LOW (ref 8.9–10.3)
GFR calc Af Amer: >60 mL/min (ref >60)
GFR calc non Af Amer: >60 mL/min (ref >60)
Glucose, Bld: 106 mg/dL — ABNORMAL HIGH (ref 65–99)
Potassium: 3.1 mmol/L — ABNORMAL LOW (ref 3.5–5.1)
SODIUM: 144 mmol/L (ref 135–145)

## 2016-07-21 MED ORDER — POTASSIUM CHLORIDE CRYS ER 20 MEQ PO TBCR
40.0000 meq | EXTENDED_RELEASE_TABLET | Freq: Four times a day (QID) | ORAL | Status: AC
  Administered 2016-07-21 (×2): 40 meq via ORAL
  Filled 2016-07-21 (×2): qty 2

## 2016-07-21 MED ORDER — VANCOMYCIN 50 MG/ML ORAL SOLUTION
125.0000 mg | Freq: Four times a day (QID) | ORAL | Status: DC
  Administered 2016-07-21 – 2016-07-22 (×5): 125 mg via ORAL
  Filled 2016-07-21 (×6): qty 2.5

## 2016-07-21 MED ORDER — AMOXICILLIN-POT CLAVULANATE 875-125 MG PO TABS
1.0000 | ORAL_TABLET | Freq: Two times a day (BID) | ORAL | Status: DC
  Administered 2016-07-21 – 2016-07-22 (×3): 1 via ORAL
  Filled 2016-07-21 (×3): qty 1

## 2016-07-21 MED ORDER — POTASSIUM CHLORIDE CRYS ER 20 MEQ PO TBCR
20.0000 meq | EXTENDED_RELEASE_TABLET | Freq: Once | ORAL | Status: AC
  Administered 2016-07-21: 20 meq via ORAL
  Filled 2016-07-21: qty 1

## 2016-07-21 MED ORDER — ENOXAPARIN SODIUM 60 MG/0.6ML ~~LOC~~ SOLN
50.0000 mg | SUBCUTANEOUS | Status: DC
  Administered 2016-07-21: 50 mg via SUBCUTANEOUS
  Filled 2016-07-21: qty 0.6

## 2016-07-21 NOTE — Progress Notes (Signed)
[facility]   Reason for visit: Follow up on wound infection  Interval History: some moderate ostomy outputp; afebrile  Physical Exam: Constitutional:  Vitals:   07/21/16 0800 07/21/16 1334  BP: 140/71 (!) 142/64  Pulse: 68 70  Resp: 20 18  Temp: 98.1 F (36.7 C) 97.9 F (36.6 C)   patient appears in NAD Respiratory: Normal respiratory effort; CTA B Cardiovascular: RRR  Review of Systems: Constitutional: negative for fevers and chills Integument/breast: negative for rash  Lab Results  Component Value Date   WBC 9.5 07/20/2016   HGB 10.3 (L) 07/20/2016   HCT 32.6 (L) 07/20/2016   MCV 86.9 07/20/2016   PLT 451 (H) 07/20/2016    Lab Results  Component Value Date   CREATININE 0.65 07/21/2016   BUN <5 (L) 07/21/2016   NA 144 07/21/2016   K 3.1 (L) 07/21/2016   CL 114 (H) 07/21/2016   CO2 25 07/21/2016    Lab Results  Component Value Date   ALT 7 (L) 07/20/2016   AST 8 (L) 07/20/2016   ALKPHOS 48 07/20/2016     Microbiology: Recent Results (from the past 240 hour(s))  Gastrointestinal Panel by PCR , Stool     Status: None   Collection Time: 07/19/16  8:27 AM  Result Value Ref Range Status   Campylobacter species NOT DETECTED NOT DETECTED Final   Plesimonas shigelloides NOT DETECTED NOT DETECTED Final   Salmonella species NOT DETECTED NOT DETECTED Final   Yersinia enterocolitica NOT DETECTED NOT DETECTED Final   Vibrio species NOT DETECTED NOT DETECTED Final   Vibrio cholerae NOT DETECTED NOT DETECTED Final   Enteroaggregative E coli (EAEC) NOT DETECTED NOT DETECTED Final   Enteropathogenic E coli (EPEC) NOT DETECTED NOT DETECTED Final   Enterotoxigenic E coli (ETEC) NOT DETECTED NOT DETECTED Final   Shiga like toxin producing E coli (STEC) NOT DETECTED NOT DETECTED Final   Shigella/Enteroinvasive E coli (EIEC) NOT DETECTED NOT DETECTED Final   Cryptosporidium NOT DETECTED NOT DETECTED Final   Cyclospora cayetanensis NOT  DETECTED NOT DETECTED Final   Entamoeba histolytica NOT DETECTED NOT DETECTED Final   Giardia lamblia NOT DETECTED NOT DETECTED Final   Adenovirus F40/41 NOT DETECTED NOT DETECTED Final   Astrovirus NOT DETECTED NOT DETECTED Final   Norovirus GI/GII NOT DETECTED NOT DETECTED Final   Rotavirus A NOT DETECTED NOT DETECTED Final   Sapovirus (I, II, IV, and V) NOT DETECTED NOT DETECTED Final  C difficile quick scan w PCR reflex     Status: Abnormal   Collection Time: 07/19/16  8:27 AM  Result Value Ref Range Status   C Diff antigen POSITIVE (A) NEGATIVE Final   C Diff toxin NEGATIVE NEGATIVE Final   C Diff interpretation Results are indeterminate. See PCR results.  Final  Clostridium Difficile by PCR     Status: Abnormal   Collection Time: 07/19/16  8:27 AM  Result Value Ref Range Status   Toxigenic C Difficile by pcr POSITIVE (A) NEGATIVE Final    Comment: Positive for toxigenic C. difficile with little to no toxin production. Only treat if clinical presentation suggests symptomatic illness. Performed at [facility]   Blood culture (routine x 2)     Status: Abnormal (Preliminary result)   Collection Time: 07/19/16 10:56 AM  Result Value Ref Range Status   Specimen Description BLOOD RIGHT HAND  Final   Special Requests IN PEDIATRIC BOTTLE 2.5ML  Final   Culture  Setup  Time   Final    GRAM POSITIVE COCCI IN PAIRS IN PEDIATRIC BOTTLE CRITICAL RESULT CALLED TO, READ BACK BY AND VERIFIED WITH: Sharlene Dory, PHARM AT [place] ON [id number] BY Rhea Bleacher Performed at [place] (A)  Final   Report Status PENDING  Incomplete  Blood Culture ID Panel (Reflexed)     Status: Abnormal   Collection Time: 07/19/16 10:56 AM  Result Value Ref Range Status   Enterococcus species DETECTED (A) NOT DETECTED Final    Comment: CRITICAL RESULT CALLED TO, READ BACK BY AND VERIFIED WITH: Sharlene Dory, PHARM AT 0745 ON [id number] BY S. YARBROUGH    Vancomycin resistance NOT  DETECTED NOT DETECTED Final   Listeria monocytogenes NOT DETECTED NOT DETECTED Final   Staphylococcus species NOT DETECTED NOT DETECTED Final   Staphylococcus aureus NOT DETECTED NOT DETECTED Final   Streptococcus species NOT DETECTED NOT DETECTED Final   Streptococcus agalactiae NOT DETECTED NOT DETECTED Final   Streptococcus pneumoniae NOT DETECTED NOT DETECTED Final   Streptococcus pyogenes NOT DETECTED NOT DETECTED Final   Acinetobacter baumannii NOT DETECTED NOT DETECTED Final   Enterobacteriaceae species NOT DETECTED NOT DETECTED Final   Enterobacter cloacae complex NOT DETECTED NOT DETECTED Final   Escherichia coli NOT DETECTED NOT DETECTED Final   Klebsiella oxytoca NOT DETECTED NOT DETECTED Final   Klebsiella pneumoniae NOT DETECTED NOT DETECTED Final   Proteus species NOT DETECTED NOT DETECTED Final   Serratia marcescens NOT DETECTED NOT DETECTED Final   Haemophilus influenzae NOT DETECTED NOT DETECTED Final   Neisseria meningitidis NOT DETECTED NOT DETECTED Final   Pseudomonas aeruginosa NOT DETECTED NOT DETECTED Final   Candida albicans NOT DETECTED NOT DETECTED Final   Candida glabrata NOT DETECTED NOT DETECTED Final   Candida krusei NOT DETECTED NOT DETECTED Final   Candida parapsilosis NOT DETECTED NOT DETECTED Final   Candida tropicalis NOT DETECTED NOT DETECTED Final    Comment: Performed at [facility]  Urine culture     Status: Abnormal   Collection Time: 07/19/16 10:58 AM  Result Value Ref Range Status   Specimen Description URINE, CLEAN CATCH  Final   Special Requests NONE  Final   Culture MULTIPLE SPECIES PRESENT, SUGGEST RECOLLECTION (A)  Final   Report Status 07/20/2016 FINAL  Final  Blood culture (routine x 2)     Status: None (Preliminary result)   Collection Time: 07/19/16  1:00 PM  Result Value Ref Range Status   Specimen Description BLOOD LEFT ANTECUBITAL  Final   Special Requests IN PEDIATRIC BOTTLE 3ML  Final   Culture   Final    NO GROWTH  2 DAYS Performed at [facility]    Report Status PENDING  Incomplete  MRSA PCR Screening     Status: None   Collection Time: 07/19/16  5:25 PM  Result Value Ref Range Status   MRSA by PCR NEGATIVE NEGATIVE Final    Comment:        The GeneXpert MRSA Assay (FDA approved for NASAL specimens only), is one component of a comprehensive MRSA colonization surveillance program. It is not intended to diagnose MRSA infection nor to guide or monitor treatment for MRSA infections.     Impression/Plan:  1. Wound infection - recently treated for osteomyelitis with IV vancomycin.  Infected wound on admission.  He should continue with Augmentin for 2 weeks at discharge Wound care as outpatient 2. Pancolitis - possible C diff with positive Ag, PCR,  GI pathogen panel negative.  Can treat with vanocmyicn 125 mg qid for 14 days total.   3. Enterococcus - 1/2.  Has not been febrile, only 1 culture.  I do not suspect active Enterococcal infection.    I will sign off, thanks

## 2016-07-21 NOTE — Progress Notes (Signed)
MD updated via phone IV catheter bent and site occluded. Poor peripheral access. Order to leave IV and out. May remain on telemetry monitor with no IV access

## 2016-07-21 NOTE — Care Management Note (Signed)
Case Management Note  Patient Details  Name: [name] MRN: [id number] Date of Birth: 03-Dec-1965  Subjective/Objective:Patient already has [facility] orders-spoke to [place] @ [facility]-able to provide instruction of wound care to patient. Patient already has wound care clinic appt set see f/u section provider section of d/c. No further CM needs or orders.                    Action/Plan:d/c home w/HHC.   Expected Discharge Date:   (unknown)               Expected Discharge Plan:  [place]  In-House Referral:  NA  Discharge planning Services  CM Consult  Post Acute Care Choice:  Home Health Choice offered to:  Patient  DME Arranged:  N/A DME Agency:  NA  HH Arranged:  RN, Social Work, Nurse's Aide [place] Agency:  [facility]  Status of Service:  Completed, signed off  If discussed at [name], dates discussed:    Additional Comments:  Dessa Phi, RN 07/21/2016, 11:58 AM
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
## 2016-07-22 LAB — BASIC METABOLIC PANEL
Anion gap: 7 (ref 5–15)
BUN: 7 mg/dL (ref 6–20)
CALCIUM: 7.5 mg/dL — AB (ref 8.9–10.3)
CO2: 26 mmol/L (ref 22–32)
CREATININE: 0.6 mg/dL — AB (ref 0.61–1.24)
Chloride: 111 mmol/L (ref 101–111)
GFR calc Af Amer: >60 mL/min (ref >60)
Glucose, Bld: 96 mg/dL (ref 65–99)
Potassium: 3.2 mmol/L — ABNORMAL LOW (ref 3.5–5.1)
SODIUM: 144 mmol/L (ref 135–145)

## 2016-07-22 LAB — CULTURE, BLOOD (ROUTINE X 2)

## 2016-07-22 MED ORDER — AMOXICILLIN-POT CLAVULANATE 875-125 MG PO TABS: 1.0000 | ORAL_TABLET | Freq: Two times a day (BID) | ORAL | 0 refills | Status: AC

## 2016-07-22 MED ORDER — VANCOMYCIN 50 MG/ML ORAL SOLUTION: 125.0000 mg | Freq: Four times a day (QID) | ORAL | 0 refills | Status: DC

## 2016-07-22 MED ORDER — CALCIUM CARBONATE-VITAMIN D 500-200 MG-UNIT PO TABS: 1.0000 | ORAL_TABLET | Freq: Two times a day (BID) | ORAL | 0 refills | Status: DC

## 2016-07-22 MED ORDER — CALCIUM CARBONATE-VITAMIN D 500-200 MG-UNIT PO TABS
1.0000 | ORAL_TABLET | Freq: Two times a day (BID) | ORAL | Status: DC
  Administered 2016-07-22: 1 via ORAL
  Filled 2016-07-22: qty 1

## 2016-07-22 MED ORDER — POTASSIUM CHLORIDE ER 10 MEQ PO TBCR: EXTENDED_RELEASE_TABLET | ORAL | 0 refills | Status: DC

## 2016-07-22 MED ORDER — POTASSIUM CHLORIDE CRYS ER 20 MEQ PO TBCR
40.0000 meq | EXTENDED_RELEASE_TABLET | ORAL | Status: DC
  Administered 2016-07-22 (×2): 40 meq via ORAL
  Filled 2016-07-22 (×2): qty 2

## 2016-07-22 NOTE — Progress Notes (Signed)
[facility] called for transport.   Dressing changed according to orders, patient tolerated well.  ileostomy bag changed, patient states he has chronic foley catheter. Patient sent with 2 weeks of dressing supplies and 2 ostomy bags.   Barbee Shropshire. Brigitte Pulse, RN

## 2016-07-22 NOTE — Discharge Instructions (Signed)
Abdominal Pain, Adult Many things can cause belly (abdominal) pain. Most times, the belly pain is not dangerous. Many cases of belly pain can be watched and treated at home. HOME CARE   Do not take medicines that help you go poop (laxatives) unless told to by your doctor.  Only take medicine as told by your doctor.  Eat or drink as told by your doctor. Your doctor will tell you if you should be on a special diet. GET HELP IF:  You do not know what is causing your belly pain.  You have belly pain while you are sick to your stomach (nauseous) or have runny poop (diarrhea).  You have pain while you pee or poop.  Your belly pain wakes you up at night.  You have belly pain that gets worse or better when you eat.  You have belly pain that gets worse when you eat fatty foods.  You have a fever. GET HELP RIGHT AWAY IF:   The pain does not go away within 2 hours.  You keep throwing up (vomiting).  The pain changes and is only in the right or left part of the belly.  You have bloody or tarry looking poop. MAKE SURE YOU:   Understand these instructions.  Will watch your condition.  Will get help right away if you are not doing well or get worse.   This information is not intended to replace advice given to you by your health care provider. Make sure you discuss any questions you have with your health care provider.   Document Released: 03/06/2008 Document Revised: 10/09/2014 Document Reviewed: 05/28/2013 Elsevier Interactive Patient Education 2016 [place] Follow with Primary MD Ricke Hey, MD in 7 days   Get CBC, CMP,checked  by Primary MD next visit.    Activity: As tolerated with Full fall precautions use walker/cane & assistance as needed   Disposition Home    Diet: regular diet , with feeding assistance and aspiration precautions.  For Heart failure patients - Check your Weight same time everyday, if you gain over 2 pounds, or you develop in leg  swelling, experience more shortness of breath or chest pain, call your Primary MD immediately. Follow Cardiac Low Salt Diet and 1.5 lit/day fluid restriction.   On your next visit with your primary care physician please Get Medicines reviewed and adjusted.   Please request your Prim.MD to go over all Hospital Tests and Procedure/Radiological results at the follow up, please get all Hospital records sent to your Prim MD by signing hospital release before you go home.   If you experience worsening of your admission symptoms, develop shortness of breath, life threatening emergency, suicidal or homicidal thoughts you must seek medical attention immediately by calling 911 or calling your MD immediately  if symptoms less severe.  You Must read complete instructions/literature along with all the possible adverse reactions/side effects for all the Medicines you take and that have been prescribed to you. Take any new Medicines after you have completely understood and accpet all the possible adverse reactions/side effects.   Do not drive, operating heavy machinery, perform activities at heights, swimming or participation in water activities or provide baby sitting services if your were admitted for syncope or siezures until you have seen by Primary MD or a Neurologist and advised to do so again.  Do not drive when taking Pain medications.    Do not take more than prescribed Pain, Sleep and Anxiety Medications  Special Instructions: If you  have smoked or chewed Tobacco  in the last 2 yrs please stop smoking, stop any regular Alcohol  and or any Recreational drug use.  Wear Seat belts while driving.   Please note  You were cared for by a hospitalist during your hospital stay. If you have any questions about your discharge medications or the care you received while you were in the hospital after you are discharged, you can call the unit and asked to speak with the hospitalist on call if the hospitalist  that took care of you is not available. Once you are discharged, your primary care physician will handle any further medical issues. Please note that NO REFILLS for any discharge medications will be authorized once you are discharged, as it is imperative that you return to your primary care physician (or establish a relationship with a primary care physician if you do not have one) for your aftercare needs so that they can reassess your need for medications and monitor your lab values.

## 2016-07-22 NOTE — Discharge Summary (Signed)
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 100.7 kg (222 lb 0.1 oz), SpO2 100 %.   Intake/Output Summary (Last 24 hours) at 07/22/16 1054 Last data filed at 07/22/16 0900  Gross per 24 hour  Intake             1240 ml  Output             5200 ml  Net            -3960 ml    Exam Gen: not in distress HEENT: Pallor present, moist mucosa,  Chest: clear b/l, no added sounds CVS: N S1&S2, no murmurs, rubs or gallop GI: soft,  nondistended, lower abdominal tenderness, bowel sounds present, colostomy in place draining semi liquid stool, chronic Foley Musculoskeletal: warm,  sacral /Ischial decubitus ulce Data Review   CBC w  Diff: Lab Results  Component Value Date   WBC 9.5 07/20/2016   HGB 10.3 (L) 07/20/2016   HCT 32.6 (L) 07/20/2016   PLT 451 (H) 07/20/2016   LYMPHOPCT 19 06/08/2016   MONOPCT 6 06/08/2016   EOSPCT 2 06/08/2016   BASOPCT 0 06/08/2016    CMP: Lab Results  Component Value Date   NA 144 07/22/2016   K 3.2 (L) 07/22/2016   CL 111 07/22/2016   CO2 26 07/22/2016   BUN 7 07/22/2016   CREATININE 0.60 (L) 07/22/2016   PROT 6.2 (L) 07/20/2016   ALBUMIN 2.3 (L) 07/20/2016   BILITOT 0.6 07/20/2016   ALKPHOS 48 07/20/2016   AST 8 (L) 07/20/2016   ALT 7 (L) 07/20/2016  .   Total Time in preparing paper work, data evaluation and todays exam - 35 minutes  Kyra Laffey M.D on 07/22/2016 at 10:54 AM  [facility]   Office  [phone]

## 2016-07-22 NOTE — Care Management Important Message (Signed)
Important Message  Patient Details  Name: Frederic Duley MRN: [id number] Date of Birth: 1966-07-21   Medicare Important Message Given:  Yes    Erenest Rasher, RN 07/22/2016, 2:20 PM

## 2016-07-22 NOTE — Progress Notes (Signed)
Patient refused to sign discharge paperwork because he states he is "disputing hospitalization". Barbee [place]. Brigitte Pulse, RN

## 2016-07-22 NOTE — Care Management Note (Addendum)
Case Management Note  Patient Details  Name: [name] MRN: [id number] Date of Birth: 1966-06-06  Subjective/Objective:    Paraplegia, colostomy, cdiff, chronic indwelling Foley                Action/Plan: Discharge Planning: AVS reviewed:  Please see previous NCM notes. Contacted [facility] to make aware of dc home today with HH RN, aide and SW. Pt has aide that comes 3 hours per day with [facility] through his Medicaid. Pt lives at home alone. States he has hospital bed and wheelchair. Has Medicaid Transportation for appts. NCM contacted [facility] and they do not have Vancomycin. Explained he can fill at [facility] in [facility]. States his aide can pick up his Rx from pharmacy.   PCP- Ricke Hey MD  Expected Discharge Date:  07/22/2016             Expected Discharge Plan:  [place]  In-House Referral:  NA  Discharge planning Services  CM Consult  Post Acute Care Choice:  Home Health Choice offered to:  Patient  DME Arranged:  N/A DME Agency:  NA  HH Arranged:  RN, Social Work, Nurse's Aide [place] Agency:  [facility]  Status of Service:  Completed, signed off  If discussed at [name], dates discussed:    Additional Comments:  Erenest Rasher, RN 07/22/2016, 2:29 PM

## 2016-07-24 LAB — CULTURE, BLOOD (ROUTINE X 2): Culture: NO GROWTH

## 2016-07-25 ENCOUNTER — Encounter (HOSPITAL_BASED_OUTPATIENT_CLINIC_OR_DEPARTMENT_OTHER): Payer: Medicare Other

## 2016-08-24 ENCOUNTER — Encounter (HOSPITAL_COMMUNITY): Payer: Self-pay | Admitting: Emergency Medicine

## 2016-08-24 ENCOUNTER — Emergency Department (HOSPITAL_COMMUNITY)
Admission: EM | Admit: 2016-08-24 | Discharge: 2016-08-24 | Disposition: A | Discharge status: Home / Self Care | Payer: Medicare Other | Attending: Emergency Medicine | Admitting: Emergency Medicine

## 2016-08-24 ENCOUNTER — Emergency Department (HOSPITAL_COMMUNITY): Payer: Medicare Other

## 2016-08-24 DIAGNOSIS — I10 Essential (primary) hypertension: Secondary | ICD-10-CM | POA: Diagnosis not present

## 2016-08-24 DIAGNOSIS — Z79899 Other long term (current) drug therapy: Secondary | ICD-10-CM | POA: Insufficient documentation

## 2016-08-24 DIAGNOSIS — F17210 Nicotine dependence, cigarettes, uncomplicated: Secondary | ICD-10-CM | POA: Diagnosis not present

## 2016-08-24 DIAGNOSIS — R109 Unspecified abdominal pain: Secondary | ICD-10-CM | POA: Diagnosis present

## 2016-08-24 DIAGNOSIS — R1084 Generalized abdominal pain: Secondary | ICD-10-CM | POA: Insufficient documentation

## 2016-08-24 LAB — CBC WITH DIFFERENTIAL/PLATELET
BASOS PCT: 0 %
Basophils Absolute: 0 10*3/uL (ref 0.0–0.1)
EOS ABS: 0 10*3/uL (ref 0.0–0.7)
EOS PCT: 1 %
HCT: 33.6 % — ABNORMAL LOW (ref 39.0–52.0)
Hemoglobin: 10.4 g/dL — ABNORMAL LOW (ref 13.0–17.0)
LYMPHS ABS: 1.4 10*3/uL (ref 0.7–4.0)
Lymphocytes Relative: 18 %
MCH: 27.4 pg (ref 26.0–34.0)
MCHC: 31 g/dL (ref 30.0–36.0)
MCV: 88.4 fL (ref 78.0–100.0)
MONO ABS: 0.4 10*3/uL (ref 0.1–1.0)
MONOS PCT: 5 %
NEUTROS PCT: 76 %
Neutro Abs: 6 10*3/uL (ref 1.7–7.7)
Platelets: 393 10*3/uL (ref 150–400)
RBC: 3.8 MIL/uL — ABNORMAL LOW (ref 4.22–5.81)
RDW: 13.9 % (ref 11.5–15.5)
WBC: 7.7 10*3/uL (ref 4.0–10.5)

## 2016-08-24 LAB — COMPREHENSIVE METABOLIC PANEL
ALBUMIN: 3.1 g/dL — AB (ref 3.5–5.0)
ALT: 7 U/L — ABNORMAL LOW (ref 17–63)
ANION GAP: 7 (ref 5–15)
AST: 13 U/L — ABNORMAL LOW (ref 15–41)
Alkaline Phosphatase: 72 U/L (ref 38–126)
BUN: 6 mg/dL (ref 6–20)
CALCIUM: 8.6 mg/dL — AB (ref 8.9–10.3)
CO2: 25 mmol/L (ref 22–32)
Chloride: 106 mmol/L (ref 101–111)
Creatinine, Ser: 0.76 mg/dL (ref 0.61–1.24)
GFR calc non Af Amer: >60 mL/min (ref >60)
GLUCOSE: 97 mg/dL (ref 65–99)
POTASSIUM: 3.5 mmol/L (ref 3.5–5.1)
SODIUM: 138 mmol/L (ref 135–145)
TOTAL PROTEIN: 8.1 g/dL (ref 6.5–8.1)
Total Bilirubin: 0.5 mg/dL (ref 0.3–1.2)

## 2016-08-24 LAB — I-STAT CG4 LACTIC ACID, ED
LACTIC ACID, VENOUS: 0.65 mmol/L (ref 0.5–1.9)
Lactic Acid, Venous: 1.25 mmol/L (ref 0.5–1.9)

## 2016-08-24 MED ORDER — HYDROCODONE-ACETAMINOPHEN 5-325 MG PO TABS: 1.0000 | ORAL_TABLET | Freq: Four times a day (QID) | ORAL | 0 refills | Status: DC | PRN

## 2016-08-24 MED ORDER — IOPAMIDOL (ISOVUE-300) INJECTION 61%
30.0000 mL | Freq: Once | INTRAVENOUS | Status: AC | PRN
  Administered 2016-08-24: 30 mL via ORAL

## 2016-08-24 MED ORDER — IOPAMIDOL (ISOVUE-300) INJECTION 61%
100.0000 mL | Freq: Once | INTRAVENOUS | Status: AC | PRN
  Administered 2016-08-24: 100 mL via INTRAVENOUS

## 2016-08-24 MED ORDER — HYDROMORPHONE HCL 1 MG/ML IJ SOLN
1.0000 mg | Freq: Once | INTRAMUSCULAR | Status: AC
  Administered 2016-08-24: 1 mg via INTRAVENOUS
  Filled 2016-08-24: qty 1

## 2016-08-24 MED ORDER — ONDANSETRON HCL 4 MG/2ML IJ SOLN
4.0000 mg | Freq: Once | INTRAMUSCULAR | Status: AC
  Administered 2016-08-24: 4 mg via INTRAVENOUS
  Filled 2016-08-24: qty 2

## 2016-08-24 NOTE — ED Notes (Signed)
Patient aware that a urine sample is needed. Patient self-catheterizes self.  Patient was given a given a kit and patient contaminated the sample by placing paper in the tray. Will continue to remind patient we need a sterile sample.

## 2016-08-24 NOTE — ED Notes (Signed)
Patient refusing to put gown on. Rationale explained and patient reassured they would be covered quickly with a warm blanket. Patient still refusing because they are too cold to take clothes off.
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
## 2016-08-24 NOTE — ED Provider Notes (Signed)
  Face-to-face evaluation   History: He presents for evaluation of abdominal pain with some nausea, noticed today. He denies blood in stool.  Physical exam: Alert, calm, cooperative. Abdomen with normal-appearing ostomy, left-sided. Mild right-sided upper and lower abdominal tenderness without rebound tenderness.  Medical screening examination/treatment/procedure(s) were conducted as a shared visit with non-physician practitioner(s) and myself.  I personally evaluated the patient during the encounter   Daleen Bo, MD 08/25/16 [phone]

## 2016-08-24 NOTE — ED Notes (Signed)
Patient refused IV and blood draw, after IV team at bedside.

## 2016-08-24 NOTE — ED Notes (Signed)
Bed: [id number] Expected date:  Expected time:  Means of arrival:  Comments: EMS- 50yo M, lower abdominal pain/HTN

## 2016-08-24 NOTE — Discharge Instructions (Signed)
Return if you have worsening pain, bloody diarrhea, or fever.  Please follow-up with the gastroenterologist listed below.

## 2016-08-24 NOTE — ED Notes (Signed)
Patient transported to CT 

## 2016-08-24 NOTE — ED Triage Notes (Signed)
Patient here from home with complaints of lower abdominal pain that started 2 days ago. Pain 10/10. Nausea no vomiting.

## 2016-12-06 ENCOUNTER — Emergency Department (HOSPITAL_COMMUNITY): Payer: Medicare Other

## 2016-12-06 ENCOUNTER — Inpatient Hospital Stay (HOSPITAL_COMMUNITY)
Admission: EM | Admit: 2016-12-06 | Discharge: 2016-12-08 | DRG: 393 | Disposition: A | Discharge status: Home Health | Payer: Medicare Other | Attending: Internal Medicine | Admitting: Internal Medicine

## 2016-12-06 ENCOUNTER — Encounter (HOSPITAL_COMMUNITY): Payer: Self-pay | Admitting: Emergency Medicine

## 2016-12-06 DIAGNOSIS — F17210 Nicotine dependence, cigarettes, uncomplicated: Secondary | ICD-10-CM | POA: Diagnosis present

## 2016-12-06 DIAGNOSIS — K9409 Other complications of colostomy: Principal | ICD-10-CM | POA: Diagnosis present

## 2016-12-06 DIAGNOSIS — Y838 Other surgical procedures as the cause of abnormal reaction of the patient, or of later complication, without mention of misadventure at the time of the procedure: Secondary | ICD-10-CM | POA: Diagnosis present

## 2016-12-06 DIAGNOSIS — K5641 Fecal impaction: Secondary | ICD-10-CM | POA: Diagnosis not present

## 2016-12-06 DIAGNOSIS — G8220 Paraplegia, unspecified: Secondary | ICD-10-CM | POA: Diagnosis present

## 2016-12-06 DIAGNOSIS — K219 Gastro-esophageal reflux disease without esophagitis: Secondary | ICD-10-CM | POA: Diagnosis present

## 2016-12-06 DIAGNOSIS — I10 Essential (primary) hypertension: Secondary | ICD-10-CM | POA: Diagnosis present

## 2016-12-06 DIAGNOSIS — Z7401 Bed confinement status: Secondary | ICD-10-CM

## 2016-12-06 DIAGNOSIS — W3400XS Accidental discharge from unspecified firearms or gun, sequela: Secondary | ICD-10-CM

## 2016-12-06 DIAGNOSIS — Z79891 Long term (current) use of opiate analgesic: Secondary | ICD-10-CM

## 2016-12-06 DIAGNOSIS — Z79899 Other long term (current) drug therapy: Secondary | ICD-10-CM

## 2016-12-06 DIAGNOSIS — Z9119 Patient's noncompliance with other medical treatment and regimen: Secondary | ICD-10-CM

## 2016-12-06 DIAGNOSIS — G8929 Other chronic pain: Secondary | ICD-10-CM | POA: Diagnosis present

## 2016-12-06 DIAGNOSIS — K5289 Other specified noninfective gastroenteritis and colitis: Secondary | ICD-10-CM | POA: Diagnosis present

## 2016-12-06 DIAGNOSIS — L89154 Pressure ulcer of sacral region, stage 4: Secondary | ICD-10-CM | POA: Diagnosis present

## 2016-12-06 LAB — COMPREHENSIVE METABOLIC PANEL
ALT: 10 U/L — ABNORMAL LOW (ref 17–63)
AST: 20 U/L (ref 15–41)
Albumin: 3.5 g/dL (ref 3.5–5.0)
Alkaline Phosphatase: 80 U/L (ref 38–126)
Anion gap: 8 (ref 5–15)
BUN: 11 mg/dL (ref 6–20)
CO2: 28 mmol/L (ref 22–32)
Calcium: 9.1 mg/dL (ref 8.9–10.3)
Chloride: 102 mmol/L (ref 101–111)
Creatinine, Ser: 1 mg/dL (ref 0.61–1.24)
GFR calc Af Amer: >60 mL/min (ref >60)
GFR calc non Af Amer: >60 mL/min (ref >60)
Glucose, Bld: 89 mg/dL (ref 65–99)
Potassium: 4.4 mmol/L (ref 3.5–5.1)
Sodium: 138 mmol/L (ref 135–145)
Total Bilirubin: 0.9 mg/dL (ref 0.3–1.2)
Total Protein: 8.3 g/dL — ABNORMAL HIGH (ref 6.5–8.1)

## 2016-12-06 LAB — CBC WITH DIFFERENTIAL/PLATELET
Basophils Absolute: 0 10*3/uL (ref 0.0–0.1)
Basophils Relative: 0 %
Eosinophils Absolute: 0.1 10*3/uL (ref 0.0–0.7)
Eosinophils Relative: 1 %
HCT: 40.3 % (ref 39.0–52.0)
Hemoglobin: 13 g/dL (ref 13.0–17.0)
Lymphocytes Relative: 26 %
Lymphs Abs: 2.2 10*3/uL (ref 0.7–4.0)
MCH: 27 pg (ref 26.0–34.0)
MCHC: 32.3 g/dL (ref 30.0–36.0)
MCV: 83.8 fL (ref 78.0–100.0)
Monocytes Absolute: 0.5 10*3/uL (ref 0.1–1.0)
Monocytes Relative: 6 %
Neutro Abs: 5.7 10*3/uL (ref 1.7–7.7)
Neutrophils Relative %: 67 %
Platelets: 342 10*3/uL (ref 150–400)
RBC: 4.81 MIL/uL (ref 4.22–5.81)
RDW: 15.6 % — ABNORMAL HIGH (ref 11.5–15.5)
WBC: 8.5 10*3/uL (ref 4.0–10.5)

## 2016-12-06 LAB — LIPASE, BLOOD: Lipase: 23 U/L (ref 11–51)

## 2016-12-06 MED ORDER — IOPAMIDOL (ISOVUE-300) INJECTION 61%
100.0000 mL | Freq: Once | INTRAVENOUS | Status: AC | PRN
  Administered 2016-12-06: 100 mL via INTRAVENOUS

## 2016-12-06 MED ORDER — IOPAMIDOL (ISOVUE-300) INJECTION 61%
30.0000 mL | Freq: Once | INTRAVENOUS | Status: AC | PRN
  Administered 2016-12-06: 30 mL via ORAL

## 2016-12-06 MED ORDER — FLEET ENEMA 7-19 GM/118ML RE ENEM: 1.0000 | ENEMA | Freq: Once | RECTAL | Status: DC

## 2016-12-06 MED ORDER — IOPAMIDOL (ISOVUE-300) INJECTION 61%
INTRAVENOUS | Status: AC
  Filled 2016-12-06: qty 100

## 2016-12-06 MED ORDER — POLYETHYLENE GLYCOL 3350 17 G PO PACK: 17.0000 g | PACK | Freq: Two times a day (BID) | ORAL | 0 refills | Status: DC

## 2016-12-06 MED ORDER — MORPHINE SULFATE (PF) 4 MG/ML IV SOLN
4.0000 mg | Freq: Once | INTRAVENOUS | Status: AC
  Administered 2016-12-06: 4 mg via INTRAVENOUS
  Filled 2016-12-06: qty 1

## 2016-12-06 MED ORDER — IOPAMIDOL (ISOVUE-300) INJECTION 61%
INTRAVENOUS | Status: AC
  Filled 2016-12-06: qty 30

## 2016-12-06 MED ORDER — LORAZEPAM 2 MG/ML IJ SOLN: 0.5000 mg | Freq: Once | INTRAMUSCULAR | Status: DC

## 2016-12-06 MED ORDER — POLYETHYLENE GLYCOL 3350 17 G PO PACK: 17.0000 g | PACK | Freq: Every day | ORAL | Status: DC
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
## 2016-12-06 NOTE — ED Triage Notes (Signed)
Per [facility] pt from home, co constipation x 3 days. Presents with colostomy bag . Mild abd distention . Reports abd pain. [name]

## 2016-12-06 NOTE — ED Notes (Signed)
Bed: [facility] Expected date:  Expected time:  Means of arrival:  Comments: EMS/constipation

## 2016-12-06 NOTE — ED Notes (Signed)
I attempted to collect labs and patient would not cooperate.

## 2016-12-06 NOTE — Discharge Instructions (Signed)
Please read attached information. If you experience any new or worsening signs or symptoms please return to the emergency room for evaluation. Please follow-up with your primary care provider or specialist as discussed. Please use medication prescribed only as directed and discontinue taking if you have any concerning signs or symptoms.   °

## 2016-12-07 ENCOUNTER — Observation Stay (HOSPITAL_COMMUNITY): Payer: Medicare Other

## 2016-12-07 DIAGNOSIS — G8929 Other chronic pain: Secondary | ICD-10-CM | POA: Diagnosis present

## 2016-12-07 DIAGNOSIS — K9409 Other complications of colostomy: Secondary | ICD-10-CM | POA: Diagnosis present

## 2016-12-07 DIAGNOSIS — L89154 Pressure ulcer of sacral region, stage 4: Secondary | ICD-10-CM | POA: Diagnosis present

## 2016-12-07 DIAGNOSIS — K219 Gastro-esophageal reflux disease without esophagitis: Secondary | ICD-10-CM | POA: Diagnosis present

## 2016-12-07 DIAGNOSIS — Y838 Other surgical procedures as the cause of abnormal reaction of the patient, or of later complication, without mention of misadventure at the time of the procedure: Secondary | ICD-10-CM | POA: Diagnosis present

## 2016-12-07 DIAGNOSIS — K5641 Fecal impaction: Secondary | ICD-10-CM | POA: Diagnosis present

## 2016-12-07 DIAGNOSIS — Z79891 Long term (current) use of opiate analgesic: Secondary | ICD-10-CM | POA: Diagnosis not present

## 2016-12-07 DIAGNOSIS — G8220 Paraplegia, unspecified: Secondary | ICD-10-CM | POA: Diagnosis present

## 2016-12-07 DIAGNOSIS — F17210 Nicotine dependence, cigarettes, uncomplicated: Secondary | ICD-10-CM | POA: Diagnosis present

## 2016-12-07 DIAGNOSIS — I10 Essential (primary) hypertension: Secondary | ICD-10-CM | POA: Diagnosis present

## 2016-12-07 DIAGNOSIS — K5289 Other specified noninfective gastroenteritis and colitis: Secondary | ICD-10-CM | POA: Diagnosis present

## 2016-12-07 DIAGNOSIS — W3400XS Accidental discharge from unspecified firearms or gun, sequela: Secondary | ICD-10-CM | POA: Diagnosis not present

## 2016-12-07 DIAGNOSIS — Z79899 Other long term (current) drug therapy: Secondary | ICD-10-CM | POA: Diagnosis not present

## 2016-12-07 DIAGNOSIS — Z7401 Bed confinement status: Secondary | ICD-10-CM | POA: Diagnosis not present

## 2016-12-07 DIAGNOSIS — Z9119 Patient's noncompliance with other medical treatment and regimen: Secondary | ICD-10-CM | POA: Diagnosis not present

## 2016-12-07 LAB — PROTIME-INR
INR: 1.09
Prothrombin Time: 14.2 seconds (ref 11.4–15.2)

## 2016-12-07 MED ORDER — BOOST / RESOURCE BREEZE PO LIQD
1.0000 | Freq: Three times a day (TID) | ORAL | Status: DC
  Administered 2016-12-07 – 2016-12-08 (×2): 1 via ORAL

## 2016-12-07 MED ORDER — SENNA 8.6 MG PO TABS
2.0000 | ORAL_TABLET | Freq: Two times a day (BID) | ORAL | Status: DC
  Administered 2016-12-07 – 2016-12-08 (×4): 17.2 mg via ORAL
  Filled 2016-12-07 (×4): qty 2

## 2016-12-07 MED ORDER — MAGNESIUM CITRATE PO SOLN: 1.0000 | Freq: Once | ORAL | Status: DC

## 2016-12-07 MED ORDER — SODIUM CHLORIDE 0.9 % IV BOLUS (SEPSIS)
500.0000 mL | Freq: Once | INTRAVENOUS | Status: AC
  Administered 2016-12-07: 500 mL via INTRAVENOUS

## 2016-12-07 MED ORDER — FAMOTIDINE IN NACL 20-0.9 MG/50ML-% IV SOLN
20.0000 mg | Freq: Two times a day (BID) | INTRAVENOUS | Status: DC
  Administered 2016-12-07 – 2016-12-08 (×4): 20 mg via INTRAVENOUS
  Filled 2016-12-07 (×5): qty 50

## 2016-12-07 MED ORDER — CHLORHEXIDINE GLUCONATE 0.12 % MT SOLN
15.0000 mL | Freq: Two times a day (BID) | OROMUCOSAL | Status: DC
  Administered 2016-12-07 – 2016-12-08 (×3): 15 mL via OROMUCOSAL
  Filled 2016-12-07 (×3): qty 15

## 2016-12-07 MED ORDER — MORPHINE SULFATE (PF) 4 MG/ML IV SOLN: 1.0000 mg | INTRAVENOUS | Status: DC | PRN

## 2016-12-07 MED ORDER — LORAZEPAM 2 MG/ML IJ SOLN
0.5000 mg | Freq: Once | INTRAMUSCULAR | Status: AC
  Administered 2016-12-07: 0.5 mg via INTRAVENOUS
  Filled 2016-12-07: qty 1

## 2016-12-07 MED ORDER — ADULT MULTIVITAMIN W/MINERALS CH
1.0000 | ORAL_TABLET | Freq: Every day | ORAL | Status: DC
  Administered 2016-12-08: 1 via ORAL
  Filled 2016-12-07: qty 1

## 2016-12-07 MED ORDER — MAGNESIUM CITRATE PO SOLN
1.0000 | Freq: Once | ORAL | Status: AC
  Administered 2016-12-07: 1 via ORAL
  Filled 2016-12-07: qty 296

## 2016-12-07 MED ORDER — MORPHINE SULFATE (PF) 4 MG/ML IV SOLN
2.0000 mg | INTRAVENOUS | Status: DC | PRN
  Administered 2016-12-07 – 2016-12-08 (×5): 2 mg via INTRAVENOUS
  Filled 2016-12-07 (×5): qty 1

## 2016-12-07 MED ORDER — ACETAMINOPHEN 650 MG RE SUPP: 650.0000 mg | Freq: Four times a day (QID) | RECTAL | Status: DC | PRN

## 2016-12-07 MED ORDER — ACETAMINOPHEN 325 MG PO TABS
650.0000 mg | ORAL_TABLET | Freq: Four times a day (QID) | ORAL | Status: DC | PRN
  Administered 2016-12-07: 650 mg via ORAL
  Filled 2016-12-07: qty 2

## 2016-12-07 MED ORDER — SODIUM CHLORIDE 0.9 % IV SOLN
INTRAVENOUS | Status: DC
  Administered 2016-12-07 (×3): via INTRAVENOUS

## 2016-12-07 NOTE — Consult Note (Signed)
[facility] Nurse consult note: Patient known to our team from previous admissions.  Reason for Consult: Chronic, non-healing pressure injuries to the sacrum and bilateral ischial tuberosities.  Heels intact.  Previous healed pressure injury to the right medial knee is stable. Wound type:Pressure Pressure Injury POA: Yes (3) Measurement: Right ischial tuberosity:  3.5cm x 12cm x 2cm Left ischial tuberosity:  2.5cm x 2.5cm x 2.5cm with tunneling at 11 o'clock measuring 5.5cm Sacral:  4cm x 3.5cm x 1.5cm with the deepest depth a sickle-shaped indention from 7-11 o'clock with depth measuring 0.5cm deeper than the rest of wound bed (for a total of 2cm). Wound bed:red, moist. Drainage (amount, consistency, odor) serous to serosanguinous.  No odor. Periwound: Left ischial tuberosity with rim of white maceration from 7-11 o'clock. Dressing procedure/placement/frequency: We will employ twice daily saline moistened gauze dressings to the three chronic wounds, top with dry dressings, ABD and secure with mesh briefs. I have today provided a mattress replacement with low air loss feature.  He declines the provision of bilateral heel pressure redistribution boots.  He is pleasant and cooperative for today's visit.   [facility] Nurse ostomy consult note Stoma type/location: LLQ colostomy with stool obstruction Stomal assessment/size: 1 and 1/2 inch red, moist with os at center. Peristomal assessment: intact, with scattered medical adhesive related skin injury (MARSI) Treatment options for stomal/peristomal skin: 2-inch paper tape border will not be used today Output: 300 mls of liquid to very soft stool (light brown) in pouch.  One soild stool mass measuring 1 and 1/2 inches in diameter passed while patient in the left side lying position. Ostomy pouching: 2pc. 2 and 3/4 inch pouching system with skin barrier ring.  This is chosen to correspond with 2 and 3/4 inch irrigation sleeve used to administer the SSE ordered earlier  today.  [facility] RN performed irrigation with preceptor and [place] Nurse assistance. PAtient tolerated procedure well. Education provided: Patient educated and informed about procedure, rationale and expectations. He is appreciative for interaction. Enrolled patient in [facility] program: No  WOC nursing team will not follow, but will remain available to this patient, the nursing and medical teams.  Please re-consult if needed. Thanks, Maudie Flakes, MSN, RN, [place], Arther Abbott  Pager# [phone]

## 2016-12-07 NOTE — ED Notes (Signed)
Ostomy irrigated with 250 ml soap enema per Merry Proud PA  X 5 doses with liquid return minimal solid product removed  Pt states pain unchanged
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
## 2016-12-07 NOTE — Progress Notes (Signed)
[facility]  Patient examined and chart reviewed.  He states he feels that he is still not passing gas despite having stool output over night.  Exam: abdomen is not distended, he has mild diffuse tenderness, BS+, brown liquids stool in colostomy  Principal Problem:   Fecal impaction  - improving- continue enemas and laxatives  Active Problems:   Paraplegia     Chronic pain On Morphine IV PRn for today    Decubitus ulcer of sacral region, stage 4 (HCC) - chronic   Debbe Odea, MD
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
## 2016-12-08 ENCOUNTER — Inpatient Hospital Stay (HOSPITAL_COMMUNITY): Payer: Medicare Other

## 2016-12-08 LAB — BASIC METABOLIC PANEL
Anion gap: 6 (ref 5–15)
BUN: 9 mg/dL (ref 6–20)
CHLORIDE: 108 mmol/L (ref 101–111)
CO2: 27 mmol/L (ref 22–32)
Calcium: 8.4 mg/dL — ABNORMAL LOW (ref 8.9–10.3)
Creatinine, Ser: 0.68 mg/dL (ref 0.61–1.24)
GFR calc Af Amer: >60 mL/min (ref >60)
GFR calc non Af Amer: >60 mL/min (ref >60)
Glucose, Bld: 85 mg/dL (ref 65–99)
POTASSIUM: 3.5 mmol/L (ref 3.5–5.1)
SODIUM: 141 mmol/L (ref 135–145)

## 2016-12-08 LAB — MRSA PCR SCREENING: MRSA BY PCR: POSITIVE — AB

## 2016-12-08 MED ORDER — BISACODYL 5 MG PO TBEC: 10.0000 mg | DELAYED_RELEASE_TABLET | Freq: Every day | ORAL | 0 refills | Status: DC | PRN

## 2016-12-08 MED ORDER — SENNA 8.6 MG PO TABS: 2.0000 | ORAL_TABLET | Freq: Every day | ORAL | 0 refills | Status: DC | PRN
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
## 2016-12-08 NOTE — Progress Notes (Signed)
Discharge planning, spoke with patient, active with [facility] for [facility] services, also has aide through [facility]. Contacted [facility] to notify them of d/c and orders for resumption of care. [phone]

## 2017-01-11 ENCOUNTER — Encounter (HOSPITAL_COMMUNITY): Payer: Self-pay | Admitting: *Deleted

## 2017-01-11 ENCOUNTER — Emergency Department (HOSPITAL_COMMUNITY)
Admission: EM | Admit: 2017-01-11 | Discharge: 2017-01-11 | Disposition: A | Discharge status: Home / Self Care | Payer: Medicare Other | Attending: Emergency Medicine | Admitting: Emergency Medicine

## 2017-01-11 DIAGNOSIS — Z760 Encounter for issue of repeat prescription: Secondary | ICD-10-CM | POA: Diagnosis not present

## 2017-01-11 DIAGNOSIS — I10 Essential (primary) hypertension: Secondary | ICD-10-CM | POA: Diagnosis not present

## 2017-01-11 DIAGNOSIS — R109 Unspecified abdominal pain: Secondary | ICD-10-CM | POA: Insufficient documentation

## 2017-01-11 DIAGNOSIS — F17210 Nicotine dependence, cigarettes, uncomplicated: Secondary | ICD-10-CM | POA: Diagnosis not present

## 2017-01-11 DIAGNOSIS — Z79899 Other long term (current) drug therapy: Secondary | ICD-10-CM | POA: Diagnosis not present

## 2017-01-11 DIAGNOSIS — G8929 Other chronic pain: Secondary | ICD-10-CM | POA: Diagnosis present

## 2017-01-11 DIAGNOSIS — Z789 Other specified health status: Secondary | ICD-10-CM

## 2017-01-11 MED ORDER — OXYCODONE-ACETAMINOPHEN 5-325 MG PO TABS
1.0000 | ORAL_TABLET | Freq: Once | ORAL | Status: AC
  Administered 2017-01-11: 1 via ORAL
  Filled 2017-01-11: qty 1

## 2017-01-11 NOTE — ED Notes (Signed)
Bed: [facility] Expected date:  Expected time:  Means of arrival:  Comments: 

## 2017-01-11 NOTE — ED Notes (Signed)
ptar has been called 
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
## 2017-01-15 ENCOUNTER — Emergency Department (HOSPITAL_COMMUNITY)
Admission: EM | Admit: 2017-01-15 | Discharge: 2017-01-16 | Disposition: A | Discharge status: Home / Self Care | Payer: Medicare Other | Attending: Emergency Medicine | Admitting: Emergency Medicine

## 2017-01-15 DIAGNOSIS — Z4801 Encounter for change or removal of surgical wound dressing: Secondary | ICD-10-CM | POA: Diagnosis not present

## 2017-01-15 DIAGNOSIS — Z79899 Other long term (current) drug therapy: Secondary | ICD-10-CM | POA: Diagnosis not present

## 2017-01-15 DIAGNOSIS — Z5189 Encounter for other specified aftercare: Secondary | ICD-10-CM

## 2017-01-15 DIAGNOSIS — I10 Essential (primary) hypertension: Secondary | ICD-10-CM | POA: Insufficient documentation

## 2017-01-15 DIAGNOSIS — F17210 Nicotine dependence, cigarettes, uncomplicated: Secondary | ICD-10-CM | POA: Diagnosis not present
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
## 2017-01-15 NOTE — ED Notes (Signed)
Notified [facility] for transportation to [place].
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
## 2017-01-15 NOTE — ED Notes (Signed)
Requesting pain medication.  Offered 650mg  tylenol per ED PA.  Patient refused.  Wound nurse at the bedside.

## 2017-01-15 NOTE — ED Notes (Signed)
Patient reported he had a manuel wheelchair at home but when discharging patient, he reported he only has a powered wheelchair and bed. Vernie Shanks, RN has placed a page back to [place], social work for assistance.
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
## 2017-01-15 NOTE — Discharge Instructions (Signed)
Follow up for any new concerns
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
## 2017-01-15 NOTE — ED Notes (Signed)
Roderic Palau, Social worker; Tax inspector; and [place] are attempting to discharge patient from facility. He continues to refuse to be discharged.
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
## 2017-01-15 NOTE — Progress Notes (Signed)
CSW spoke to CM.  CM assessing wheelchair availability.  Alphonse Guild. Thekla Colborn, Latanya Presser, LCAS Clinical Social Worker Ph: [phone]
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
## 2017-01-15 NOTE — ED Notes (Signed)
Patient notified that if he continues verbal aggression towards staff he will be escorted by security off of the property.  Patient voiced understanding and states that he will not continue with this behavior.  At this time patient remains in hallway bed.

## 2017-01-15 NOTE — ED Notes (Signed)
Social worker at bedside.

## 2017-01-15 NOTE — ED Notes (Signed)
Patient continues to wait for case manager to obtain a non-power wheelchair so he can go to a shelter.
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
## 2017-01-16 NOTE — ED Notes (Signed)
Pt refused vitals 6:32am

## 2017-01-16 NOTE — ED Notes (Signed)
It was reported that pt is extremely racist and has made staff members cry. Pt is currently asleep.  NAD noted.  Awaiting SW to consult.

## 2017-01-16 NOTE — ED Notes (Signed)
Per SW, the pt now has power at his home.  Pt is aware of d/c and is in agreement.  [facility] has been called for transport back to home.

## 2017-01-16 NOTE — ED Provider Notes (Signed)
8:13 AM Pt seen by me yesterday. Pt apparently was discharged home yesterday after it was discovered that he had non motororized chair to get around at home. Pt was also provided with shelters he can go to. Pt refused to go to a shelter and initially refused to go home at all, but then wanted to be discharged home to his house. According to social worker over night, this was thought to be an "irrational" thought and they requested TTS consult. Psych consult was placed and pt has not had it yet. I do not think this is an irrational behavior, I think this is his choice. He understands what his offered and alert and oriented and capable of making a decisions. I think his chosing to go home rather than shelter is reasonable, even if his home has no power. We did look up his address and [employer] is at the site repairing line, so hopefully his power will be back on today. Pt continues to be arrogant in ED, verbally assaulting almost everyone who talks to him calling us "stupid" and "slow" and demanding things. I think pt is stable to discharge home from medical stand point and will be discharged by [facility].   Vitals:   01/15/17 1522 01/15/17 1725 01/15/17 1944 01/16/17 0035  BP: (!) 181/79 (!) 169/72 (!) 163/80 (!) 158/68  Pulse: 66 63 (!) 57 (!) 51  Resp: 20 18 20 20   Temp: 98.6 F (37 C)   98.5 F (36.9 C)  TempSrc: Oral   Oral  SpO2: 100% 99% 100% 99%    10:21 AM Patient again refusing to leave. He states "you're going to just put me out on the street. Explained to him that we will place him back in his home, and that we have a nonmotorized wheelchair for him. Patient states he is having shoulder pains and unable to use motorized wheelchair. He is moving his arms normally and emergency department with no distress or problems. I expect to him this is only a temporary solution until his power restored. He does have furniture in the house and a recliner where he can sleep, however he states "I don't want my  bedsore smell all over furniture." I also explained to him that we have provided him with shelters where he can go, but patient is refusing. After having a long discussion with him, I explained to him that he does not have a medical need to be in emergency department and if he is refusing to go to a shelter with power, he can go back to his home with a wheelchair was provided for him and asked his friends for assistance if needed. Patient will be discharged home.   Jeannett Senior, PA-C 01/16/17 0820    Jeannett Senior, PA-C 01/16/17 [place], MD 01/16/17 2100
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
## 2017-01-16 NOTE — Progress Notes (Signed)
CSW met with patient in room, per patient, power is back on in patients house. RN has called [facility] for transportation. Patient does not want wheel chair anymore due to power being back on. Wheel chair will be left at front desk for [facility] service to pick up.   Kingsley Spittle, LCSWA Clinical Social Worker [phone]

## 2017-01-16 NOTE — Discharge Planning (Signed)
[facility] contacted regarding pt no longer needing wheelchair as pt power has been restored and he is able to charge his electric wheelchair.  EDCM contacted [place] liaison to pick up DME.

## 2017-01-16 NOTE — ED Notes (Signed)
Pt requested to self cath.

## 2017-02-13 ENCOUNTER — Encounter (HOSPITAL_COMMUNITY): Payer: Self-pay | Admitting: Emergency Medicine

## 2017-02-13 ENCOUNTER — Inpatient Hospital Stay (HOSPITAL_COMMUNITY)
Admission: EM | Admit: 2017-02-13 | Discharge: 2017-02-17 | DRG: 372 | Disposition: A | Discharge status: Home / Self Care | Payer: Medicare Other | Attending: Internal Medicine | Admitting: Internal Medicine

## 2017-02-13 ENCOUNTER — Emergency Department (HOSPITAL_COMMUNITY): Payer: Medicare Other

## 2017-02-13 DIAGNOSIS — Z809 Family history of malignant neoplasm, unspecified: Secondary | ICD-10-CM | POA: Diagnosis not present

## 2017-02-13 DIAGNOSIS — K219 Gastro-esophageal reflux disease without esophagitis: Secondary | ICD-10-CM | POA: Diagnosis present

## 2017-02-13 DIAGNOSIS — F17210 Nicotine dependence, cigarettes, uncomplicated: Secondary | ICD-10-CM | POA: Diagnosis present

## 2017-02-13 DIAGNOSIS — G8929 Other chronic pain: Secondary | ICD-10-CM | POA: Diagnosis not present

## 2017-02-13 DIAGNOSIS — L89150 Pressure ulcer of sacral region, unstageable: Secondary | ICD-10-CM | POA: Diagnosis present

## 2017-02-13 DIAGNOSIS — K5900 Constipation, unspecified: Secondary | ICD-10-CM | POA: Diagnosis not present

## 2017-02-13 DIAGNOSIS — E1169 Type 2 diabetes mellitus with other specified complication: Secondary | ICD-10-CM | POA: Diagnosis present

## 2017-02-13 DIAGNOSIS — I10 Essential (primary) hypertension: Secondary | ICD-10-CM | POA: Diagnosis present

## 2017-02-13 DIAGNOSIS — W3400XS Accidental discharge from unspecified firearms or gun, sequela: Secondary | ICD-10-CM | POA: Diagnosis not present

## 2017-02-13 DIAGNOSIS — Z841 Family history of disorders of kidney and ureter: Secondary | ICD-10-CM

## 2017-02-13 DIAGNOSIS — T402X5A Adverse effect of other opioids, initial encounter: Secondary | ICD-10-CM | POA: Diagnosis present

## 2017-02-13 DIAGNOSIS — G8220 Paraplegia, unspecified: Secondary | ICD-10-CM | POA: Diagnosis present

## 2017-02-13 DIAGNOSIS — M8668 Other chronic osteomyelitis, other site: Secondary | ICD-10-CM | POA: Diagnosis present

## 2017-02-13 DIAGNOSIS — Z23 Encounter for immunization: Secondary | ICD-10-CM | POA: Diagnosis present

## 2017-02-13 DIAGNOSIS — T80818A Extravasation of other vesicant agent, initial encounter: Secondary | ICD-10-CM | POA: Diagnosis not present

## 2017-02-13 DIAGNOSIS — L8990 Pressure ulcer of unspecified site, unspecified stage: Secondary | ICD-10-CM | POA: Insufficient documentation

## 2017-02-13 DIAGNOSIS — Z933 Colostomy status: Secondary | ICD-10-CM

## 2017-02-13 DIAGNOSIS — K529 Noninfective gastroenteritis and colitis, unspecified: Secondary | ICD-10-CM | POA: Diagnosis present

## 2017-02-13 DIAGNOSIS — G894 Chronic pain syndrome: Secondary | ICD-10-CM | POA: Diagnosis present

## 2017-02-13 DIAGNOSIS — L8940 Pressure ulcer of contiguous site of back, buttock and hip, unspecified stage: Secondary | ICD-10-CM | POA: Diagnosis not present

## 2017-02-13 DIAGNOSIS — K5903 Drug induced constipation: Secondary | ICD-10-CM | POA: Diagnosis present

## 2017-02-13 DIAGNOSIS — A0472 Enterocolitis due to Clostridium difficile, not specified as recurrent: Secondary | ICD-10-CM | POA: Diagnosis not present

## 2017-02-13 HISTORY — DX: Noninfective gastroenteritis and colitis, unspecified: K52.9

## 2017-02-13 LAB — COMPREHENSIVE METABOLIC PANEL
ALT: 10 U/L — ABNORMAL LOW (ref 17–63)
AST: 16 U/L (ref 15–41)
Albumin: 3.1 g/dL — ABNORMAL LOW (ref 3.5–5.0)
Alkaline Phosphatase: 72 U/L (ref 38–126)
Anion gap: 8 (ref 5–15)
BUN: 6 mg/dL (ref 6–20)
CO2: 27 mmol/L (ref 22–32)
Calcium: 8.8 mg/dL — ABNORMAL LOW (ref 8.9–10.3)
Chloride: 104 mmol/L (ref 101–111)
Creatinine, Ser: 0.75 mg/dL (ref 0.61–1.24)
GFR calc Af Amer: >60 mL/min (ref >60)
GFR calc non Af Amer: >60 mL/min (ref >60)
Glucose, Bld: 98 mg/dL (ref 65–99)
Potassium: 3.8 mmol/L (ref 3.5–5.1)
Sodium: 139 mmol/L (ref 135–145)
Total Bilirubin: 0.4 mg/dL (ref 0.3–1.2)
Total Protein: 7.8 g/dL (ref 6.5–8.1)

## 2017-02-13 LAB — CBC WITH DIFFERENTIAL/PLATELET
Basophils Absolute: 0 10*3/uL (ref 0.0–0.1)
Basophils Relative: 0 %
Eosinophils Absolute: 0 10*3/uL (ref 0.0–0.7)
Eosinophils Relative: 1 %
HCT: 38.9 % — ABNORMAL LOW (ref 39.0–52.0)
Hemoglobin: 12 g/dL — ABNORMAL LOW (ref 13.0–17.0)
Lymphocytes Relative: 29 %
Lymphs Abs: 1.9 10*3/uL (ref 0.7–4.0)
MCH: 26 pg (ref 26.0–34.0)
MCHC: 30.8 g/dL (ref 30.0–36.0)
MCV: 84.2 fL (ref 78.0–100.0)
Monocytes Absolute: 0.3 10*3/uL (ref 0.1–1.0)
Monocytes Relative: 4 %
Neutro Abs: 4.3 10*3/uL (ref 1.7–7.7)
Neutrophils Relative %: 66 %
Platelets: 318 10*3/uL (ref 150–400)
RBC: 4.62 MIL/uL (ref 4.22–5.81)
RDW: 14.8 % (ref 11.5–15.5)
WBC: 6.5 10*3/uL (ref 4.0–10.5)

## 2017-02-13 LAB — LIPASE, BLOOD: Lipase: 24 U/L (ref 11–51)

## 2017-02-13 MED ORDER — ACETAMINOPHEN 650 MG RE SUPP: 650.0000 mg | Freq: Four times a day (QID) | RECTAL | Status: DC | PRN

## 2017-02-13 MED ORDER — SODIUM CHLORIDE 0.9 % IV SOLN
Freq: Once | INTRAVENOUS | Status: AC
  Administered 2017-02-13: 18:00:00 via INTRAVENOUS

## 2017-02-13 MED ORDER — IOPAMIDOL (ISOVUE-300) INJECTION 61%
INTRAVENOUS | Status: AC
  Administered 2017-02-13: 100 mL via INTRAVENOUS
  Filled 2017-02-13: qty 100

## 2017-02-13 MED ORDER — FAMOTIDINE 20 MG PO TABS
20.0000 mg | ORAL_TABLET | Freq: Two times a day (BID) | ORAL | Status: DC
  Administered 2017-02-13 – 2017-02-17 (×8): 20 mg via ORAL
  Filled 2017-02-13 (×8): qty 1

## 2017-02-13 MED ORDER — SENNA 8.6 MG PO TABS
2.0000 | ORAL_TABLET | Freq: Every day | ORAL | Status: DC
  Administered 2017-02-14: 17.2 mg via ORAL
  Filled 2017-02-13: qty 2

## 2017-02-13 MED ORDER — KETOROLAC TROMETHAMINE 30 MG/ML IJ SOLN
30.0000 mg | Freq: Once | INTRAMUSCULAR | Status: AC
  Administered 2017-02-13: 30 mg via INTRAVENOUS
  Filled 2017-02-13: qty 1

## 2017-02-13 MED ORDER — POLYETHYLENE GLYCOL 3350 17 G PO PACK
17.0000 g | PACK | Freq: Two times a day (BID) | ORAL | Status: DC
  Administered 2017-02-13: 17 g via ORAL
  Filled 2017-02-13: qty 1

## 2017-02-13 MED ORDER — BISACODYL 5 MG PO TBEC
10.0000 mg | DELAYED_RELEASE_TABLET | Freq: Every day | ORAL | Status: DC | PRN
  Administered 2017-02-15: 10 mg via ORAL
  Filled 2017-02-13: qty 2

## 2017-02-13 MED ORDER — MORPHINE SULFATE (PF) 4 MG/ML IV SOLN
4.0000 mg | Freq: Once | INTRAVENOUS | Status: AC
  Administered 2017-02-13: 4 mg via INTRAVENOUS
  Filled 2017-02-13: qty 1

## 2017-02-13 MED ORDER — OXYCODONE HCL 5 MG PO TABS
5.0000 mg | ORAL_TABLET | Freq: Four times a day (QID) | ORAL | Status: DC | PRN
  Administered 2017-02-14 – 2017-02-17 (×6): 5 mg via ORAL
  Filled 2017-02-13 (×6): qty 1

## 2017-02-13 MED ORDER — MORPHINE SULFATE (PF) 4 MG/ML IV SOLN
2.0000 mg | INTRAVENOUS | Status: DC | PRN
  Administered 2017-02-13 – 2017-02-14 (×3): 2 mg via INTRAVENOUS
  Filled 2017-02-13 (×3): qty 1

## 2017-02-13 MED ORDER — ACETAMINOPHEN 325 MG PO TABS: 650.0000 mg | ORAL_TABLET | Freq: Four times a day (QID) | ORAL | Status: DC | PRN

## 2017-02-13 MED ORDER — CIPROFLOXACIN IN D5W 400 MG/200ML IV SOLN
400.0000 mg | Freq: Once | INTRAVENOUS | Status: AC
  Administered 2017-02-13: 400 mg via INTRAVENOUS
  Filled 2017-02-13: qty 200

## 2017-02-13 MED ORDER — METRONIDAZOLE IN NACL 5-0.79 MG/ML-% IV SOLN
500.0000 mg | Freq: Once | INTRAVENOUS | Status: AC
  Administered 2017-02-13: 500 mg via INTRAVENOUS
  Filled 2017-02-13: qty 100

## 2017-02-13 MED ORDER — OXYCODONE-ACETAMINOPHEN 10-325 MG PO TABS: 1.0000 | ORAL_TABLET | Freq: Four times a day (QID) | ORAL | Status: DC | PRN

## 2017-02-13 MED ORDER — ENOXAPARIN SODIUM 40 MG/0.4ML ~~LOC~~ SOLN
40.0000 mg | SUBCUTANEOUS | Status: DC
  Administered 2017-02-13 – 2017-02-16 (×4): 40 mg via SUBCUTANEOUS
  Filled 2017-02-13 (×4): qty 0.4

## 2017-02-13 MED ORDER — ONDANSETRON HCL 4 MG PO TABS: 4.0000 mg | ORAL_TABLET | Freq: Four times a day (QID) | ORAL | Status: DC | PRN

## 2017-02-13 MED ORDER — OXYCODONE-ACETAMINOPHEN 5-325 MG PO TABS
1.0000 | ORAL_TABLET | Freq: Four times a day (QID) | ORAL | Status: DC | PRN
  Administered 2017-02-14 – 2017-02-17 (×6): 1 via ORAL
  Filled 2017-02-13 (×6): qty 1

## 2017-02-13 MED ORDER — ONDANSETRON HCL 4 MG/2ML IJ SOLN: 4.0000 mg | Freq: Four times a day (QID) | INTRAMUSCULAR | Status: DC | PRN
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
## 2017-02-13 NOTE — ED Notes (Signed)
Pt returned from CT °

## 2017-02-13 NOTE — Progress Notes (Signed)
Called to see patient in CT room at [facility] after contrast extravasation during abdomen and pelvis CT. No apparent intravascular contrast on CT images, with estimated extravasation volume of entire 100 mL which were injected. Palpable superficial fluid collection in right upper arm just proximal to the [facility] IV. The patient denied pain, sensory changes, or other related complaints. No skin changes or distal weakness. Normal cap refill. Arm was elevated and an ice pack applied. Discussed treatment and warning signs with patient. The event was also communicated to ED by radiology staff and Dr. Nevada Crane who interpreted the CT examination.

## 2017-02-14 ENCOUNTER — Encounter (HOSPITAL_COMMUNITY): Payer: Self-pay | Admitting: General Practice

## 2017-02-14 DIAGNOSIS — G8929 Other chronic pain: Secondary | ICD-10-CM

## 2017-02-14 DIAGNOSIS — G8220 Paraplegia, unspecified: Secondary | ICD-10-CM

## 2017-02-14 DIAGNOSIS — K5900 Constipation, unspecified: Secondary | ICD-10-CM | POA: Diagnosis present

## 2017-02-14 DIAGNOSIS — K529 Noninfective gastroenteritis and colitis, unspecified: Secondary | ICD-10-CM

## 2017-02-14 DIAGNOSIS — L8940 Pressure ulcer of contiguous site of back, buttock and hip, unspecified stage: Secondary | ICD-10-CM

## 2017-02-14 DIAGNOSIS — K5903 Drug induced constipation: Secondary | ICD-10-CM | POA: Diagnosis present

## 2017-02-14 DIAGNOSIS — I10 Essential (primary) hypertension: Secondary | ICD-10-CM

## 2017-02-14 LAB — MRSA PCR SCREENING: MRSA BY PCR: POSITIVE — AB

## 2017-02-14 LAB — COMPREHENSIVE METABOLIC PANEL
ALK PHOS: 59 U/L (ref 38–126)
ALT: 8 U/L — AB (ref 17–63)
AST: 12 U/L — AB (ref 15–41)
Albumin: 2.5 g/dL — ABNORMAL LOW (ref 3.5–5.0)
Anion gap: 6 (ref 5–15)
BUN: 10 mg/dL (ref 6–20)
CALCIUM: 8.2 mg/dL — AB (ref 8.9–10.3)
CHLORIDE: 106 mmol/L (ref 101–111)
CO2: 26 mmol/L (ref 22–32)
CREATININE: 0.96 mg/dL (ref 0.61–1.24)
GFR calc Af Amer: >60 mL/min (ref >60)
GFR calc non Af Amer: >60 mL/min (ref >60)
GLUCOSE: 92 mg/dL (ref 65–99)
Potassium: 3.7 mmol/L (ref 3.5–5.1)
SODIUM: 138 mmol/L (ref 135–145)
Total Bilirubin: 0.3 mg/dL (ref 0.3–1.2)
Total Protein: 6.4 g/dL — ABNORMAL LOW (ref 6.5–8.1)

## 2017-02-14 LAB — CBC
HCT: 32.8 % — ABNORMAL LOW (ref 39.0–52.0)
Hemoglobin: 9.8 g/dL — ABNORMAL LOW (ref 13.0–17.0)
MCH: 25.4 pg — AB (ref 26.0–34.0)
MCHC: 29.9 g/dL — AB (ref 30.0–36.0)
MCV: 85 fL (ref 78.0–100.0)
PLATELETS: 264 10*3/uL (ref 150–400)
RBC: 3.86 MIL/uL — ABNORMAL LOW (ref 4.22–5.81)
RDW: 15.1 % (ref 11.5–15.5)
WBC: 5.8 10*3/uL (ref 4.0–10.5)

## 2017-02-14 LAB — C DIFFICILE QUICK SCREEN W PCR REFLEX
C Diff antigen: POSITIVE — AB
C Diff toxin: NEGATIVE

## 2017-02-14 LAB — CLOSTRIDIUM DIFFICILE BY PCR: Toxigenic C. Difficile by PCR: POSITIVE — AB

## 2017-02-14 MED ORDER — METRONIDAZOLE IN NACL 5-0.79 MG/ML-% IV SOLN
500.0000 mg | Freq: Three times a day (TID) | INTRAVENOUS | Status: DC
  Administered 2017-02-14 – 2017-02-16 (×5): 500 mg via INTRAVENOUS
  Filled 2017-02-14 (×6): qty 100

## 2017-02-14 MED ORDER — CIPROFLOXACIN IN D5W 400 MG/200ML IV SOLN
400.0000 mg | Freq: Two times a day (BID) | INTRAVENOUS | Status: DC
  Administered 2017-02-14: 400 mg via INTRAVENOUS
  Filled 2017-02-14: qty 200

## 2017-02-14 MED ORDER — MUPIROCIN 2 % EX OINT
1.0000 "application " | TOPICAL_OINTMENT | Freq: Two times a day (BID) | CUTANEOUS | Status: DC
  Administered 2017-02-14 – 2017-02-17 (×8): 1 via NASAL
  Filled 2017-02-14 (×2): qty 22

## 2017-02-14 MED ORDER — SENNOSIDES-DOCUSATE SODIUM 8.6-50 MG PO TABS
1.0000 | ORAL_TABLET | Freq: Two times a day (BID) | ORAL | Status: DC
  Administered 2017-02-14 – 2017-02-17 (×6): 1 via ORAL
  Filled 2017-02-14 (×6): qty 1

## 2017-02-14 MED ORDER — POLYETHYLENE GLYCOL 3350 17 G PO PACK
17.0000 g | PACK | Freq: Two times a day (BID) | ORAL | Status: DC
  Administered 2017-02-14 – 2017-02-17 (×6): 17 g via ORAL
  Filled 2017-02-14 (×7): qty 1

## 2017-02-14 MED ORDER — METRONIDAZOLE IN NACL 5-0.79 MG/ML-% IV SOLN
500.0000 mg | Freq: Three times a day (TID) | INTRAVENOUS | Status: DC
  Administered 2017-02-14: 500 mg via INTRAVENOUS
  Filled 2017-02-14 (×2): qty 100

## 2017-02-14 MED ORDER — VANCOMYCIN 50 MG/ML ORAL SOLUTION
125.0000 mg | Freq: Four times a day (QID) | ORAL | Status: DC
  Administered 2017-02-14: 125 mg via ORAL
  Filled 2017-02-14 (×3): qty 2.5

## 2017-02-14 MED ORDER — ADULT MULTIVITAMIN W/MINERALS CH
1.0000 | ORAL_TABLET | Freq: Every day | ORAL | Status: DC
  Administered 2017-02-14 – 2017-02-17 (×4): 1 via ORAL
  Filled 2017-02-14 (×4): qty 1

## 2017-02-14 MED ORDER — PRO-STAT SUGAR FREE PO LIQD
30.0000 mL | Freq: Two times a day (BID) | ORAL | Status: DC
  Administered 2017-02-14: 30 mL via ORAL
  Filled 2017-02-14: qty 30

## 2017-02-14 MED ORDER — PNEUMOCOCCAL VAC POLYVALENT 25 MCG/0.5ML IJ INJ
0.5000 mL | INJECTION | INTRAMUSCULAR | Status: AC | PRN
  Administered 2017-02-15: 0.5 mL via INTRAMUSCULAR
  Filled 2017-02-14: qty 0.5

## 2017-02-14 MED ORDER — CHLORHEXIDINE GLUCONATE CLOTH 2 % EX PADS
6.0000 | MEDICATED_PAD | Freq: Every day | CUTANEOUS | Status: AC
  Administered 2017-02-14 – 2017-02-17 (×4): 6 via TOPICAL

## 2017-02-14 MED ORDER — PRO-STAT SUGAR FREE PO LIQD
30.0000 mL | Freq: Three times a day (TID) | ORAL | Status: DC
  Administered 2017-02-14 – 2017-02-17 (×9): 30 mL via ORAL
  Filled 2017-02-14 (×9): qty 30
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
## 2017-02-14 NOTE — Care Management Note (Addendum)
Case Management Note  Patient Details  Name: [name] MRN: [id number] Date of Birth: 14-May-1966  Subjective/Objective:                    Action/Plan:  Patient from home with [facility] through [facility] for wound care . Will need resumption of care order and face to face.   History paraplegia for 37 years from [place]. 05/30/16 lap diverting colostomy.  [facility] with [facility] aware of admission.  Confirmed face sheet information with patient. He will need an ambulance transport home on day of discharge. He lives alone and wants to continue with [facility] .  Patient has wheelchair and hospital bed already  Expected Discharge Date:                  Expected Discharge Plan:  [name]  In-House Referral:     Discharge planning Services  CM Consult  Post Acute Care Choice:  [facility], Resumption of Svcs/PTA Provider Choice offered to:   Patient   DME Arranged:    DME Agency:     HH Arranged:  RN [place] Agency:  [facility]  Status of Service:  In process, will continue to follow  If discussed at Long Length of Stay Meetings, dates discussed:    Additional Comments:  [name], RN 02/14/2017, 11:23 AM

## 2017-02-14 NOTE — Consult Note (Signed)
[facility] Nurse wound consult note Reason for Consult:sacral pressure ulcers Wound type:pressure ulcers Pressure Injury POA: Yes Measurement:Sacral wound 6cm x 5cm x 2cm, questionable shelf, pt refused further investigation for tracts or shelving. Right ischium 3.5cm x 10cm x 2cm with probable shelf and questionable tract. Coccyx wound 2cm x 3cm x 3cm  Wound bed: pink wound beds Drainage (amount, consistency, odor) pt had just been cleaned, non present, however there is white macerated tissue around wounds, epibole present around all wounds Periwound: intact Dressing procedure/placement/frequency:Pt would not allow complete assessment as in checking for tracts or shelves.  Pt also is not going to allow any type of packing in wounds.  Therefor I ordered   As pt will not allow packing to his sacral and buttock wounds please cleanse with NS, pat dry, apply foam dressings over each of the  wounds, perform daily. I have also ordered a loss air loss therapeutic mattress. We will not follow, but will remain available to this patient, to nursing, and the medical and/or surgical teams.  Please re-consult if we need to assist further.   Fara Olden, RN-C, WTA-C Wound Treatment Associate
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
## 2017-02-15 DIAGNOSIS — Z933 Colostomy status: Secondary | ICD-10-CM

## 2017-02-15 LAB — CBC WITH DIFFERENTIAL/PLATELET
BASOS ABS: 0 10*3/uL (ref 0.0–0.1)
BASOS PCT: 0 %
EOS ABS: 0.2 10*3/uL (ref 0.0–0.7)
Eosinophils Relative: 3 %
HEMATOCRIT: 31.8 % — AB (ref 39.0–52.0)
HEMOGLOBIN: 9.7 g/dL — AB (ref 13.0–17.0)
Lymphocytes Relative: 51 %
Lymphs Abs: 2.7 10*3/uL (ref 0.7–4.0)
MCH: 26.3 pg (ref 26.0–34.0)
MCHC: 30.5 g/dL (ref 30.0–36.0)
MCV: 86.2 fL (ref 78.0–100.0)
MONO ABS: 0.4 10*3/uL (ref 0.1–1.0)
MONOS PCT: 7 %
NEUTROS ABS: 2 10*3/uL (ref 1.7–7.7)
NEUTROS PCT: 39 %
Platelets: 253 10*3/uL (ref 150–400)
RBC: 3.69 MIL/uL — ABNORMAL LOW (ref 4.22–5.81)
RDW: 15.5 % (ref 11.5–15.5)
WBC: 5.3 10*3/uL (ref 4.0–10.5)

## 2017-02-15 LAB — BASIC METABOLIC PANEL
ANION GAP: 6 (ref 5–15)
BUN: 15 mg/dL (ref 6–20)
CALCIUM: 8.2 mg/dL — AB (ref 8.9–10.3)
CO2: 28 mmol/L (ref 22–32)
CREATININE: 0.86 mg/dL (ref 0.61–1.24)
Chloride: 107 mmol/L (ref 101–111)
GFR calc non Af Amer: >60 mL/min (ref >60)
Glucose, Bld: 91 mg/dL (ref 65–99)
Potassium: 4.4 mmol/L (ref 3.5–5.1)
SODIUM: 141 mmol/L (ref 135–145)

## 2017-02-15 MED ORDER — SORBITOL 70 % SOLN
30.0000 mL | Freq: Once | Status: AC
  Administered 2017-02-15: 30 mL via ORAL
  Filled 2017-02-15: qty 30
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
## 2017-02-15 NOTE — Care Management Note (Addendum)
Case Management Note  Patient Details  Name: Tiger Spieker MRN: [id number] Date of Birth: April 05, 1966  Subjective/Objective:                    Action/Plan:  Patient has wheel chair, hospital bed and low loss air mattress at home already .   Patient is requesting ambulance transportation home on day of discharge. Confirmed face sheet information.   [facility] patient admission . Resumption of care order entered.  Added HHPT to measurement for correct wheel chair. [place] with [facility]. Expected Discharge Date:                  Expected Discharge Plan:  [place]  In-House Referral:     Discharge planning Services  CM Consult  Post Acute Care Choice:  Home Health, Resumption of Svcs/PTA Provider Choice offered to:  Patient  DME Arranged:    DME Agency:     HH Arranged:  RN Watson Agency:  [facility]  Status of Service:  In process, will continue to follow  If discussed at Long Length of Stay Meetings, dates discussed:    Additional Comments:  [name], RN 02/15/2017, 10:18 AM

## 2017-02-16 LAB — BASIC METABOLIC PANEL
Anion gap: 6 (ref 5–15)
BUN: 13 mg/dL (ref 6–20)
CO2: 28 mmol/L (ref 22–32)
Calcium: 8.2 mg/dL — ABNORMAL LOW (ref 8.9–10.3)
Chloride: 108 mmol/L (ref 101–111)
Creatinine, Ser: 0.78 mg/dL (ref 0.61–1.24)
GFR calc Af Amer: >60 mL/min (ref >60)
GFR calc non Af Amer: >60 mL/min (ref >60)
Glucose, Bld: 112 mg/dL — ABNORMAL HIGH (ref 65–99)
Potassium: 4.2 mmol/L (ref 3.5–5.1)
Sodium: 142 mmol/L (ref 135–145)

## 2017-02-16 LAB — CBC
HCT: 32.4 % — ABNORMAL LOW (ref 39.0–52.0)
HEMOGLOBIN: 9.6 g/dL — AB (ref 13.0–17.0)
MCH: 25.3 pg — ABNORMAL LOW (ref 26.0–34.0)
MCHC: 29.6 g/dL — AB (ref 30.0–36.0)
MCV: 85.5 fL (ref 78.0–100.0)
PLATELETS: 260 10*3/uL (ref 150–400)
RBC: 3.79 MIL/uL — ABNORMAL LOW (ref 4.22–5.81)
RDW: 15.5 % (ref 11.5–15.5)
WBC: 4.8 10*3/uL (ref 4.0–10.5)

## 2017-02-16 MED ORDER — LACTULOSE 10 GM/15ML PO SOLN
30.0000 g | Freq: Three times a day (TID) | ORAL | Status: DC
  Administered 2017-02-16: 30 g via ORAL
  Filled 2017-02-16: qty 45

## 2017-02-16 MED ORDER — LACTULOSE 10 GM/15ML PO SOLN
30.0000 g | Freq: Once | ORAL | Status: AC
Start: 2017-02-16 — End: 2017-02-16
  Administered 2017-02-16: 30 g via ORAL
  Filled 2017-02-16: qty 45

## 2017-02-16 MED ORDER — METRONIDAZOLE 500 MG PO TABS
500.0000 mg | ORAL_TABLET | Freq: Three times a day (TID) | ORAL | Status: DC
  Administered 2017-02-16 – 2017-02-17 (×4): 500 mg via ORAL
  Filled 2017-02-16 (×4): qty 1
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
## 2017-02-17 LAB — BASIC METABOLIC PANEL
ANION GAP: 8 (ref 5–15)
BUN: 16 mg/dL (ref 6–20)
CHLORIDE: 103 mmol/L (ref 101–111)
CO2: 26 mmol/L (ref 22–32)
Calcium: 8.4 mg/dL — ABNORMAL LOW (ref 8.9–10.3)
Creatinine, Ser: 0.84 mg/dL (ref 0.61–1.24)
GFR calc non Af Amer: >60 mL/min (ref >60)
Glucose, Bld: 91 mg/dL (ref 65–99)
POTASSIUM: 4.9 mmol/L (ref 3.5–5.1)
SODIUM: 137 mmol/L (ref 135–145)

## 2017-02-17 LAB — CBC WITH DIFFERENTIAL/PLATELET
Basophils Absolute: 0 10*3/uL (ref 0.0–0.1)
Basophils Relative: 1 %
EOS ABS: 0.2 10*3/uL (ref 0.0–0.7)
Eosinophils Relative: 4 %
HCT: 35.2 % — ABNORMAL LOW (ref 39.0–52.0)
HEMOGLOBIN: 10.6 g/dL — AB (ref 13.0–17.0)
LYMPHS ABS: 1.4 10*3/uL (ref 0.7–4.0)
LYMPHS PCT: 33 %
MCH: 25.7 pg — AB (ref 26.0–34.0)
MCHC: 30.1 g/dL (ref 30.0–36.0)
MCV: 85.4 fL (ref 78.0–100.0)
Monocytes Absolute: 0.3 10*3/uL (ref 0.1–1.0)
Monocytes Relative: 7 %
NEUTROS PCT: 55 %
Neutro Abs: 2.3 10*3/uL (ref 1.7–7.7)
Platelets: 257 10*3/uL (ref 150–400)
RBC: 4.12 MIL/uL — AB (ref 4.22–5.81)
RDW: 15.6 % — ABNORMAL HIGH (ref 11.5–15.5)
WBC: 4.1 10*3/uL (ref 4.0–10.5)

## 2017-02-17 MED ORDER — SENNOSIDES-DOCUSATE SODIUM 8.6-50 MG PO TABS: 1.0000 | ORAL_TABLET | Freq: Two times a day (BID) | ORAL | Status: DC

## 2017-02-17 MED ORDER — METRONIDAZOLE 500 MG PO TABS: 500.0000 mg | ORAL_TABLET | Freq: Three times a day (TID) | ORAL | 0 refills | Status: AC

## 2017-02-17 MED ORDER — PRO-STAT SUGAR FREE PO LIQD: 30.0000 mL | Freq: Three times a day (TID) | ORAL | 0 refills | Status: DC
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
## 2017-02-17 NOTE — Progress Notes (Signed)
CSW was consulted by MD to assist patient with transportation's needs. Patient is a paraplegic and needed the assitance of PTAR to return home. CSW is signing of as patients needs have been met.  Rhea Pink, MSW,  [place]

## 2017-02-17 NOTE — Progress Notes (Signed)
Notified [facility] dc order is in place for today for resumption of services.

## 2017-02-17 NOTE — Progress Notes (Signed)
Discharge home. Discharge instruction given to patient, no question verbalized. SW made aware discharge to arrange [facility]  For transport.

## 2017-03-14 ENCOUNTER — Emergency Department (HOSPITAL_COMMUNITY)
Admission: EM | Admit: 2017-03-14 | Discharge: 2017-03-15 | Disposition: A | Discharge status: Home / Self Care | Payer: Medicare Other | Attending: Emergency Medicine | Admitting: Emergency Medicine

## 2017-03-14 ENCOUNTER — Encounter (HOSPITAL_COMMUNITY): Payer: Self-pay

## 2017-03-14 DIAGNOSIS — K5900 Constipation, unspecified: Secondary | ICD-10-CM | POA: Insufficient documentation

## 2017-03-14 DIAGNOSIS — I10 Essential (primary) hypertension: Secondary | ICD-10-CM | POA: Diagnosis not present

## 2017-03-14 DIAGNOSIS — F17210 Nicotine dependence, cigarettes, uncomplicated: Secondary | ICD-10-CM | POA: Insufficient documentation

## 2017-03-14 DIAGNOSIS — F1410 Cocaine abuse, uncomplicated: Secondary | ICD-10-CM | POA: Diagnosis not present

## 2017-03-14 DIAGNOSIS — Z79899 Other long term (current) drug therapy: Secondary | ICD-10-CM | POA: Insufficient documentation

## 2017-03-14 DIAGNOSIS — N390 Urinary tract infection, site not specified: Secondary | ICD-10-CM | POA: Insufficient documentation

## 2017-03-14 DIAGNOSIS — Z9049 Acquired absence of other specified parts of digestive tract: Secondary | ICD-10-CM | POA: Insufficient documentation

## 2017-03-14 LAB — URINALYSIS, ROUTINE W REFLEX MICROSCOPIC
Glucose, UA: NEGATIVE mg/dL
Ketones, ur: 5 mg/dL — AB
NITRITE: POSITIVE — AB
PH: 8 (ref 5.0–8.0)
Protein, ur: 100 mg/dL — AB
SPECIFIC GRAVITY, URINE: 1.02 (ref 1.005–1.030)

## 2017-03-14 MED ORDER — MORPHINE SULFATE (PF) 4 MG/ML IV SOLN
4.0000 mg | Freq: Once | INTRAVENOUS | Status: AC
  Administered 2017-03-15: 4 mg via INTRAVENOUS
  Filled 2017-03-14: qty 1

## 2017-03-14 MED ORDER — SODIUM CHLORIDE 0.9 % IV BOLUS (SEPSIS)
1000.0000 mL | Freq: Once | INTRAVENOUS | Status: AC
  Administered 2017-03-15: 1000 mL via INTRAVENOUS
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
## 2017-03-14 NOTE — ED Triage Notes (Signed)
Pt arrives from home reporting his colostomy is impacted. HE is currently refusing to wear colostomy bag.

## 2017-03-14 NOTE — ED Notes (Addendum)
Colostomy bag placed on pt.

## 2017-03-15 ENCOUNTER — Emergency Department (HOSPITAL_COMMUNITY)
Admission: EM | Admit: 2017-03-15 | Discharge: 2017-03-15 | Disposition: A | Discharge status: Home / Self Care | Payer: Medicare Other | Source: Home / Self Care | Attending: Emergency Medicine | Admitting: Emergency Medicine

## 2017-03-15 ENCOUNTER — Emergency Department (HOSPITAL_COMMUNITY): Payer: Medicare Other

## 2017-03-15 ENCOUNTER — Encounter (HOSPITAL_COMMUNITY): Payer: Self-pay

## 2017-03-15 DIAGNOSIS — K5903 Drug induced constipation: Secondary | ICD-10-CM

## 2017-03-15 DIAGNOSIS — K5900 Constipation, unspecified: Secondary | ICD-10-CM | POA: Diagnosis not present

## 2017-03-15 LAB — I-STAT CHEM 8, ED
BUN: 9 mg/dL (ref 6–20)
CALCIUM ION: 1.13 mmol/L — AB (ref 1.15–1.40)
CHLORIDE: 102 mmol/L (ref 101–111)
Creatinine, Ser: 0.8 mg/dL (ref 0.61–1.24)
GLUCOSE: 86 mg/dL (ref 65–99)
HCT: 35 % — ABNORMAL LOW (ref 39.0–52.0)
Hemoglobin: 11.9 g/dL — ABNORMAL LOW (ref 13.0–17.0)
Potassium: 3.1 mmol/L — ABNORMAL LOW (ref 3.5–5.1)
Sodium: 142 mmol/L (ref 135–145)
TCO2: 27 mmol/L (ref 0–100)

## 2017-03-15 LAB — HEPATIC FUNCTION PANEL
ALK PHOS: 56 U/L (ref 38–126)
ALT: 11 U/L — ABNORMAL LOW (ref 17–63)
AST: 18 U/L (ref 15–41)
Albumin: 3 g/dL — ABNORMAL LOW (ref 3.5–5.0)
BILIRUBIN INDIRECT: 0.6 mg/dL (ref 0.3–0.9)
BILIRUBIN TOTAL: 0.7 mg/dL (ref 0.3–1.2)
Bilirubin, Direct: 0.1 mg/dL (ref 0.1–0.5)
TOTAL PROTEIN: 7.4 g/dL (ref 6.5–8.1)

## 2017-03-15 LAB — CBC WITH DIFFERENTIAL/PLATELET
Basophils Absolute: 0 10*3/uL (ref 0.0–0.1)
Basophils Relative: 0 %
Eosinophils Absolute: 0.1 10*3/uL (ref 0.0–0.7)
Eosinophils Relative: 1 %
HCT: 35.8 % — ABNORMAL LOW (ref 39.0–52.0)
Hemoglobin: 11 g/dL — ABNORMAL LOW (ref 13.0–17.0)
LYMPHS ABS: 2.7 10*3/uL (ref 0.7–4.0)
LYMPHS PCT: 35 %
MCH: 26.4 pg (ref 26.0–34.0)
MCHC: 30.7 g/dL (ref 30.0–36.0)
MCV: 85.9 fL (ref 78.0–100.0)
MONO ABS: 0.5 10*3/uL (ref 0.1–1.0)
MONOS PCT: 6 %
Neutro Abs: 4.6 10*3/uL (ref 1.7–7.7)
Neutrophils Relative %: 58 %
PLATELETS: 279 10*3/uL (ref 150–400)
RBC: 4.17 MIL/uL — AB (ref 4.22–5.81)
RDW: 15.3 % (ref 11.5–15.5)
WBC: 7.9 10*3/uL (ref 4.0–10.5)

## 2017-03-15 LAB — RAPID URINE DRUG SCREEN, HOSP PERFORMED
Amphetamines: NOT DETECTED
Barbiturates: NOT DETECTED
Benzodiazepines: NOT DETECTED
Cocaine: POSITIVE — AB
OPIATES: POSITIVE — AB
Tetrahydrocannabinol: NOT DETECTED

## 2017-03-15 MED ORDER — FLEET ENEMA 7-19 GM/118ML RE ENEM: 1.0000 | ENEMA | Freq: Every day | RECTAL | 0 refills | Status: DC | PRN

## 2017-03-15 MED ORDER — SENNA 8.6 MG PO TABS
2.0000 | ORAL_TABLET | Freq: Once | ORAL | Status: AC
  Administered 2017-03-15: 17.2 mg via ORAL
  Filled 2017-03-15: qty 2

## 2017-03-15 MED ORDER — IOPAMIDOL (ISOVUE-300) INJECTION 61%
INTRAVENOUS | Status: AC
Start: 2017-03-15 — End: 2017-03-15
  Administered 2017-03-15: 100 mL
  Filled 2017-03-15: qty 100

## 2017-03-15 MED ORDER — DEXTROSE 5 % IV SOLN
1.0000 g | Freq: Once | INTRAVENOUS | Status: AC
  Administered 2017-03-15: 1 g via INTRAVENOUS
  Filled 2017-03-15: qty 10

## 2017-03-15 MED ORDER — MAGNESIUM CITRATE PO SOLN
1.0000 | Freq: Once | ORAL | Status: AC
  Administered 2017-03-15: 1 via ORAL
  Filled 2017-03-15: qty 296

## 2017-03-15 MED ORDER — CEPHALEXIN 500 MG PO CAPS: 500.0000 mg | ORAL_CAPSULE | Freq: Two times a day (BID) | ORAL | 0 refills | Status: DC

## 2017-03-15 NOTE — ED Provider Notes (Signed)
Patient signed out to me by Haywood Pao.  Patient has been constipated, and reports decreased output from his colostomy. His colostomy bag was replaced in the emergency department. He has having output into the colostomy bag now, but still complains that he has not had enough. His CT scan shows no evidence of obstruction. He is noted to have a urinalysis consistent with UTI and was given Rocephin by IV.  Patient is encouraged follow-up with his primary care doctor as well as his surgeon. He became frustrated, claiming that he wants his colostomy reversed. I explained that as there is no emergent indication for this tonight, and that he needs to follow-up with his surgeon and primary doctor. He states that he is also frustrated that he still has some stool in him, however he is having output into the colostomy now as visualized by me. I encouraged him to continue taking the stool softeners and to also try a fleets enema. I have also advised him to take the antibiotic for his urinary tract infection. Patient states "I did not come here for a urinary tract infection, I don't need a prescription for this, I do not want to be treated for this tonight."  I informed the patient that I will still write him a prescription to treat his UTI as well as for fleets enema, which he can choose to fill or not, but that he needs to follow-up with his regular doctor for these issues.  Patient then went into a long rant about how he is not going to have his insurance pay for this visit. I encouraged him to take this up with his insurance company, I also informed him that I have no control over financial matters. I notified the patient that my primary concern is to screen him for emergent life-threatening conditions. On his workup today he was found to have a urinary tract infection which if left untreated could lead to sepsis, organ failure, and death. He was treated appropriately for this by the prior team and will receive a  prescription for antibiotics.  He is not vomiting. I also informed him that we did not find any emergent condition requiring admission to the hospital or need for emergent surgery. He remained fixated on the fact that he still has some stool, but acknowledge that his colostomy is having output currently.  I reassured the patient to the best of my ability.  I find him stable for outpatient treatment and close follow-up.  Return precautions discussed.  I discussed the findings with Dr. Billy Fischer, who agrees with plan for discharge to home with outpatient treatment.   Montine Circle, PA-C 03/15/17 Danelle Earthly    Gareth Morgan, MD 03/15/17 [phone]

## 2017-03-15 NOTE — ED Notes (Signed)
Pt refusing discharge instructions.

## 2017-03-15 NOTE — ED Notes (Signed)
Pt asked for a straight cath. When this RN offered to help pt states "get out I do it myself." pt very verbally abusive.

## 2017-03-15 NOTE — ED Notes (Signed)
Patient transported to CT 

## 2017-03-15 NOTE — ED Notes (Signed)
Pt refused for this tech to get discharge vitals.
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
## 2017-03-15 NOTE — ED Notes (Signed)
Pt refusing soap suds enema. Pt requesting to be in/out cathed

## 2017-03-15 NOTE — ED Notes (Signed)
Pt refusing d/c instructions at this time. Pt refusing to give address, pt refusing to confirm he has a key to gain access to his home for d/c.  Pt stating he has had no assistance with his constipation issue and wants to be digitally disimpacted, per PA and MD pt is to be d/c to follow up with surgeon.

## 2017-03-15 NOTE — Discharge Instructions (Signed)
Your CT scan showed a 6.1 x 4.5 cm area of stool near your ostomy.  This CT scan was done last night and this morning you were able to pass a couple of hard, formed stools after some medications.  I suspect you were able to move part if not all of the stool out.   Please continue taking the following bowel regimen for the next 3-5 days: Miralax daily Senna two pills twice daily Stool softener daily At least 2 L of water daily  Follow up with your surgeon and primary care provider for further discussion of persistent symptoms

## 2017-03-15 NOTE — ED Notes (Signed)
Spoke with patient on care in ED.  Patient refusing to give information

## 2017-03-15 NOTE — ED Triage Notes (Signed)
Pt was discharged home this morning and taken by [facility]. When pt arrived home pt did not want to stay and asked to be brought back to the hospital.

## 2017-03-15 NOTE — ED Notes (Signed)
[facility] called for transport, let charge or Corene Cornea know when [facility] arrives.

## 2017-03-15 NOTE — ED Notes (Signed)
Patient transported to X-ray 

## 2017-03-15 NOTE — Discharge Instructions (Signed)
Please follow-up with your surgeon.  Call them in the morning.  The number is listed.  Return for fever, no output, or persistent vomiting or worsening pain.

## 2017-05-01 ENCOUNTER — Encounter (HOSPITAL_BASED_OUTPATIENT_CLINIC_OR_DEPARTMENT_OTHER): Payer: Medicare Other | Attending: Surgery

## 2017-05-01 DIAGNOSIS — L89324 Pressure ulcer of left buttock, stage 4: Secondary | ICD-10-CM | POA: Insufficient documentation

## 2017-05-01 DIAGNOSIS — Z79891 Long term (current) use of opiate analgesic: Secondary | ICD-10-CM | POA: Insufficient documentation

## 2017-05-01 DIAGNOSIS — K219 Gastro-esophageal reflux disease without esophagitis: Secondary | ICD-10-CM | POA: Insufficient documentation

## 2017-05-01 DIAGNOSIS — I10 Essential (primary) hypertension: Secondary | ICD-10-CM | POA: Diagnosis not present

## 2017-05-01 DIAGNOSIS — Z79899 Other long term (current) drug therapy: Secondary | ICD-10-CM | POA: Diagnosis not present

## 2017-05-01 DIAGNOSIS — L89154 Pressure ulcer of sacral region, stage 4: Secondary | ICD-10-CM | POA: Diagnosis not present

## 2017-05-01 DIAGNOSIS — F17210 Nicotine dependence, cigarettes, uncomplicated: Secondary | ICD-10-CM | POA: Insufficient documentation

## 2017-05-01 DIAGNOSIS — G8222 Paraplegia, incomplete: Secondary | ICD-10-CM | POA: Insufficient documentation

## 2017-05-01 DIAGNOSIS — Z933 Colostomy status: Secondary | ICD-10-CM | POA: Diagnosis not present

## 2017-05-01 DIAGNOSIS — L89314 Pressure ulcer of right buttock, stage 4: Secondary | ICD-10-CM | POA: Insufficient documentation

## 2017-05-30 ENCOUNTER — Encounter (HOSPITAL_BASED_OUTPATIENT_CLINIC_OR_DEPARTMENT_OTHER): Payer: Medicare Other | Attending: Surgery

## 2017-05-30 DIAGNOSIS — L89154 Pressure ulcer of sacral region, stage 4: Secondary | ICD-10-CM | POA: Insufficient documentation

## 2017-05-30 DIAGNOSIS — F17210 Nicotine dependence, cigarettes, uncomplicated: Secondary | ICD-10-CM | POA: Diagnosis not present

## 2017-05-30 DIAGNOSIS — Z933 Colostomy status: Secondary | ICD-10-CM | POA: Insufficient documentation

## 2017-05-30 DIAGNOSIS — I10 Essential (primary) hypertension: Secondary | ICD-10-CM | POA: Insufficient documentation

## 2017-05-30 DIAGNOSIS — G8222 Paraplegia, incomplete: Secondary | ICD-10-CM | POA: Diagnosis not present

## 2017-05-30 DIAGNOSIS — L89314 Pressure ulcer of right buttock, stage 4: Secondary | ICD-10-CM | POA: Insufficient documentation

## 2017-05-30 DIAGNOSIS — L89324 Pressure ulcer of left buttock, stage 4: Secondary | ICD-10-CM | POA: Insufficient documentation

## 2017-06-13 ENCOUNTER — Inpatient Hospital Stay (HOSPITAL_COMMUNITY)
Admission: EM | Admit: 2017-06-13 | Discharge: 2017-06-18 | DRG: 391 | Disposition: A | Discharge status: Home Health | Payer: Medicare Other | Attending: Internal Medicine | Admitting: Internal Medicine

## 2017-06-13 ENCOUNTER — Emergency Department (HOSPITAL_COMMUNITY): Payer: Medicare Other

## 2017-06-13 ENCOUNTER — Encounter (HOSPITAL_COMMUNITY): Payer: Self-pay | Admitting: Emergency Medicine

## 2017-06-13 ENCOUNTER — Inpatient Hospital Stay (HOSPITAL_COMMUNITY): Payer: Medicare Other

## 2017-06-13 DIAGNOSIS — R112 Nausea with vomiting, unspecified: Secondary | ICD-10-CM | POA: Diagnosis present

## 2017-06-13 DIAGNOSIS — G8929 Other chronic pain: Secondary | ICD-10-CM | POA: Diagnosis present

## 2017-06-13 DIAGNOSIS — Z9119 Patient's noncompliance with other medical treatment and regimen: Secondary | ICD-10-CM | POA: Diagnosis not present

## 2017-06-13 DIAGNOSIS — R1030 Lower abdominal pain, unspecified: Secondary | ICD-10-CM

## 2017-06-13 DIAGNOSIS — L89154 Pressure ulcer of sacral region, stage 4: Secondary | ICD-10-CM | POA: Diagnosis present

## 2017-06-13 DIAGNOSIS — R1113 Vomiting of fecal matter: Secondary | ICD-10-CM | POA: Diagnosis not present

## 2017-06-13 DIAGNOSIS — Z933 Colostomy status: Secondary | ICD-10-CM | POA: Diagnosis not present

## 2017-06-13 DIAGNOSIS — F1410 Cocaine abuse, uncomplicated: Secondary | ICD-10-CM | POA: Diagnosis present

## 2017-06-13 DIAGNOSIS — K5903 Drug induced constipation: Principal | ICD-10-CM | POA: Diagnosis present

## 2017-06-13 DIAGNOSIS — Z9049 Acquired absence of other specified parts of digestive tract: Secondary | ICD-10-CM

## 2017-06-13 DIAGNOSIS — I10 Essential (primary) hypertension: Secondary | ICD-10-CM | POA: Diagnosis present

## 2017-06-13 DIAGNOSIS — Z23 Encounter for immunization: Secondary | ICD-10-CM

## 2017-06-13 DIAGNOSIS — K5641 Fecal impaction: Secondary | ICD-10-CM | POA: Diagnosis present

## 2017-06-13 DIAGNOSIS — K5900 Constipation, unspecified: Secondary | ICD-10-CM | POA: Diagnosis present

## 2017-06-13 DIAGNOSIS — G8220 Paraplegia, unspecified: Secondary | ICD-10-CM | POA: Diagnosis present

## 2017-06-13 DIAGNOSIS — R109 Unspecified abdominal pain: Secondary | ICD-10-CM | POA: Diagnosis present

## 2017-06-13 DIAGNOSIS — E860 Dehydration: Secondary | ICD-10-CM | POA: Diagnosis present

## 2017-06-13 DIAGNOSIS — T402X5A Adverse effect of other opioids, initial encounter: Secondary | ICD-10-CM | POA: Diagnosis present

## 2017-06-13 DIAGNOSIS — F17210 Nicotine dependence, cigarettes, uncomplicated: Secondary | ICD-10-CM | POA: Diagnosis present

## 2017-06-13 DIAGNOSIS — Z79891 Long term (current) use of opiate analgesic: Secondary | ICD-10-CM | POA: Diagnosis not present

## 2017-06-13 DIAGNOSIS — R1084 Generalized abdominal pain: Secondary | ICD-10-CM | POA: Diagnosis present

## 2017-06-13 DIAGNOSIS — N390 Urinary tract infection, site not specified: Secondary | ICD-10-CM | POA: Diagnosis present

## 2017-06-13 DIAGNOSIS — E876 Hypokalemia: Secondary | ICD-10-CM | POA: Diagnosis present

## 2017-06-13 DIAGNOSIS — E43 Unspecified severe protein-calorie malnutrition: Secondary | ICD-10-CM | POA: Diagnosis present

## 2017-06-13 DIAGNOSIS — Z6832 Body mass index (BMI) 32.0-32.9, adult: Secondary | ICD-10-CM | POA: Diagnosis not present

## 2017-06-13 DIAGNOSIS — L8990 Pressure ulcer of unspecified site, unspecified stage: Secondary | ICD-10-CM | POA: Diagnosis present

## 2017-06-13 DIAGNOSIS — R3989 Other symptoms and signs involving the genitourinary system: Secondary | ICD-10-CM

## 2017-06-13 DIAGNOSIS — T40605A Adverse effect of unspecified narcotics, initial encounter: Secondary | ICD-10-CM | POA: Diagnosis present

## 2017-06-13 DIAGNOSIS — Z79899 Other long term (current) drug therapy: Secondary | ICD-10-CM

## 2017-06-13 DIAGNOSIS — K219 Gastro-esophageal reflux disease without esophagitis: Secondary | ICD-10-CM | POA: Diagnosis present

## 2017-06-13 LAB — COMPREHENSIVE METABOLIC PANEL
ALBUMIN: 3.1 g/dL — AB (ref 3.5–5.0)
ALK PHOS: 61 U/L (ref 38–126)
ALT: 10 U/L — ABNORMAL LOW (ref 17–63)
AST: 16 U/L (ref 15–41)
Anion gap: 12 (ref 5–15)
BILIRUBIN TOTAL: 0.8 mg/dL (ref 0.3–1.2)
BUN: 15 mg/dL (ref 6–20)
CO2: 26 mmol/L (ref 22–32)
Calcium: 8.8 mg/dL — ABNORMAL LOW (ref 8.9–10.3)
Chloride: 99 mmol/L — ABNORMAL LOW (ref 101–111)
Creatinine, Ser: 1.19 mg/dL (ref 0.61–1.24)
GFR calc Af Amer: >60 mL/min (ref >60)
GFR calc non Af Amer: >60 mL/min (ref >60)
GLUCOSE: 83 mg/dL (ref 65–99)
POTASSIUM: 3.6 mmol/L (ref 3.5–5.1)
Sodium: 137 mmol/L (ref 135–145)
TOTAL PROTEIN: 8.2 g/dL — AB (ref 6.5–8.1)

## 2017-06-13 LAB — LIPASE, BLOOD: Lipase: 32 U/L (ref 11–51)

## 2017-06-13 LAB — CBC WITH DIFFERENTIAL/PLATELET
BASOS ABS: 0 10*3/uL (ref 0.0–0.1)
BASOS PCT: 0 %
Eosinophils Absolute: 0 10*3/uL (ref 0.0–0.7)
Eosinophils Relative: 0 %
HEMATOCRIT: 41 % (ref 39.0–52.0)
HEMOGLOBIN: 13.2 g/dL (ref 13.0–17.0)
Lymphocytes Relative: 20 %
Lymphs Abs: 1.8 10*3/uL (ref 0.7–4.0)
MCH: 26.6 pg (ref 26.0–34.0)
MCHC: 32.2 g/dL (ref 30.0–36.0)
MCV: 82.5 fL (ref 78.0–100.0)
Monocytes Absolute: 0.5 10*3/uL (ref 0.1–1.0)
Monocytes Relative: 6 %
NEUTROS ABS: 6.6 10*3/uL (ref 1.7–7.7)
NEUTROS PCT: 74 %
Platelets: 309 10*3/uL (ref 150–400)
RBC: 4.97 MIL/uL (ref 4.22–5.81)
RDW: 14.6 % (ref 11.5–15.5)
WBC: 8.9 10*3/uL (ref 4.0–10.5)

## 2017-06-13 LAB — LACTIC ACID, PLASMA
LACTIC ACID, VENOUS: 1.1 mmol/L (ref 0.5–1.9)
LACTIC ACID, VENOUS: 1.3 mmol/L (ref 0.5–1.9)

## 2017-06-13 MED ORDER — HYDROMORPHONE HCL 1 MG/ML IJ SOLN: 1.0000 mg | INTRAMUSCULAR | Status: DC | PRN

## 2017-06-13 MED ORDER — LORAZEPAM 2 MG/ML IJ SOLN: 1.0000 mg | Freq: Four times a day (QID) | INTRAMUSCULAR | Status: DC | PRN

## 2017-06-13 MED ORDER — TRAMADOL HCL 50 MG PO TABS: 50.0000 mg | ORAL_TABLET | Freq: Four times a day (QID) | ORAL | Status: DC | PRN

## 2017-06-13 MED ORDER — FLEET ENEMA 7-19 GM/118ML RE ENEM: 1.0000 | ENEMA | Freq: Once | RECTAL | Status: DC | PRN

## 2017-06-13 MED ORDER — PANTOPRAZOLE SODIUM 40 MG IV SOLR
40.0000 mg | Freq: Every day | INTRAVENOUS | Status: DC
  Administered 2017-06-14 – 2017-06-16 (×3): 40 mg via INTRAVENOUS
  Filled 2017-06-13 (×3): qty 40

## 2017-06-13 MED ORDER — SORBITOL 70 % SOLN
960.0000 mL | TOPICAL_OIL | Freq: Two times a day (BID) | ORAL | Status: DC
  Administered 2017-06-13 – 2017-06-15 (×4): 960 mL via RECTAL
  Filled 2017-06-13 (×13): qty 240

## 2017-06-13 MED ORDER — IOPAMIDOL (ISOVUE-300) INJECTION 61%
INTRAVENOUS | Status: AC
  Administered 2017-06-13: 100 mL
  Filled 2017-06-13: qty 100

## 2017-06-13 MED ORDER — MORPHINE SULFATE (PF) 4 MG/ML IV SOLN
4.0000 mg | INTRAVENOUS | Status: DC | PRN
  Administered 2017-06-13 – 2017-06-18 (×14): 4 mg via INTRAVENOUS
  Filled 2017-06-13 (×14): qty 1

## 2017-06-13 MED ORDER — SODIUM CHLORIDE 0.9 % IV SOLN
INTRAVENOUS | Status: DC
  Administered 2017-06-13 – 2017-06-17 (×7): via INTRAVENOUS

## 2017-06-13 MED ORDER — MORPHINE SULFATE (PF) 4 MG/ML IV SOLN
4.0000 mg | Freq: Once | INTRAVENOUS | Status: AC
  Administered 2017-06-13: 4 mg via INTRAVENOUS
  Filled 2017-06-13: qty 1

## 2017-06-13 MED ORDER — TRAZODONE HCL 50 MG PO TABS: 25.0000 mg | ORAL_TABLET | Freq: Every evening | ORAL | Status: DC | PRN

## 2017-06-13 MED ORDER — SODIUM CHLORIDE 0.9 % IV BOLUS (SEPSIS)
1000.0000 mL | INTRAVENOUS | Status: AC
  Administered 2017-06-13: 1000 mL via INTRAVENOUS

## 2017-06-13 MED ORDER — ACETAMINOPHEN 325 MG PO TABS: 650.0000 mg | ORAL_TABLET | Freq: Four times a day (QID) | ORAL | Status: DC | PRN

## 2017-06-13 MED ORDER — FOLIC ACID 1 MG PO TABS
1.0000 mg | ORAL_TABLET | Freq: Every day | ORAL | Status: DC
  Administered 2017-06-14 – 2017-06-18 (×5): 1 mg via ORAL
  Filled 2017-06-13 (×5): qty 1

## 2017-06-13 MED ORDER — ONDANSETRON HCL 4 MG/2ML IJ SOLN
4.0000 mg | Freq: Four times a day (QID) | INTRAMUSCULAR | Status: DC | PRN
  Administered 2017-06-15: 4 mg via INTRAVENOUS
  Filled 2017-06-13: qty 2

## 2017-06-13 MED ORDER — THIAMINE HCL 100 MG/ML IJ SOLN
100.0000 mg | Freq: Every day | INTRAMUSCULAR | Status: DC
  Filled 2017-06-13: qty 2

## 2017-06-13 MED ORDER — VITAMIN B-1 100 MG PO TABS
100.0000 mg | ORAL_TABLET | Freq: Every day | ORAL | Status: DC
  Administered 2017-06-14 – 2017-06-18 (×5): 100 mg via ORAL
  Filled 2017-06-13 (×5): qty 1

## 2017-06-13 MED ORDER — LORAZEPAM 1 MG PO TABS: 1.0000 mg | ORAL_TABLET | Freq: Four times a day (QID) | ORAL | Status: DC | PRN

## 2017-06-13 MED ORDER — POLYETHYLENE GLYCOL 3350 17 G PO PACK
17.0000 g | PACK | Freq: Two times a day (BID) | ORAL | Status: DC
  Administered 2017-06-13 – 2017-06-18 (×10): 17 g via ORAL
  Filled 2017-06-13 (×10): qty 1

## 2017-06-13 MED ORDER — OXYCODONE-ACETAMINOPHEN 10-325 MG PO TABS: 1.0000 | ORAL_TABLET | Freq: Four times a day (QID) | ORAL | Status: DC | PRN

## 2017-06-13 MED ORDER — ACETAMINOPHEN 650 MG RE SUPP: 650.0000 mg | Freq: Four times a day (QID) | RECTAL | Status: DC | PRN

## 2017-06-13 MED ORDER — ONDANSETRON HCL 4 MG PO TABS: 4.0000 mg | ORAL_TABLET | Freq: Four times a day (QID) | ORAL | Status: DC | PRN

## 2017-06-13 MED ORDER — KETOROLAC TROMETHAMINE 30 MG/ML IJ SOLN: 30.0000 mg | Freq: Four times a day (QID) | INTRAMUSCULAR | Status: AC | PRN

## 2017-06-13 MED ORDER — OXYCODONE HCL 5 MG PO TABS
5.0000 mg | ORAL_TABLET | Freq: Four times a day (QID) | ORAL | Status: DC | PRN
  Administered 2017-06-13 – 2017-06-18 (×8): 5 mg via ORAL
  Filled 2017-06-13 (×9): qty 1

## 2017-06-13 MED ORDER — OXYCODONE-ACETAMINOPHEN 5-325 MG PO TABS
1.0000 | ORAL_TABLET | Freq: Four times a day (QID) | ORAL | Status: DC | PRN
  Administered 2017-06-14 – 2017-06-18 (×7): 1 via ORAL
  Filled 2017-06-13 (×8): qty 1

## 2017-06-13 MED ORDER — ADULT MULTIVITAMIN W/MINERALS CH
1.0000 | ORAL_TABLET | Freq: Every day | ORAL | Status: DC
  Administered 2017-06-14 – 2017-06-18 (×5): 1 via ORAL
  Filled 2017-06-13 (×5): qty 1

## 2017-06-13 MED ORDER — SENNOSIDES-DOCUSATE SODIUM 8.6-50 MG PO TABS
1.0000 | ORAL_TABLET | Freq: Two times a day (BID) | ORAL | Status: DC
  Administered 2017-06-13 – 2017-06-18 (×10): 1 via ORAL
  Filled 2017-06-13 (×11): qty 1

## 2017-06-13 NOTE — ED Notes (Signed)
Pt unable to tolerate NGT placement x 2. Will notify MD

## 2017-06-13 NOTE — ED Notes (Signed)
Pt transported to xray 
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
## 2017-06-13 NOTE — ED Provider Notes (Signed)
Received signout from [place]. See provider note for full history and physical examination. Briefly, patient is a paraplegic with colostomy bag complaining of 5 days of abdominal pain with nausea and vomiting of feces last night. He states he had no bowel movement for 3 days but had some relief with MiraLAX. He does take daily narcotics for pain control. We are awaiting labs and CT of the abdomen and pelvis. If there is no bowel obstruction on CT, he is stable for discharge home with enema prior to dc.  Physical Exam  BP (!) 192/76   Pulse 61   Temp 98.3 F (36.8 C) (Oral)   Resp (!) 28   Ht 5\' 9"  (1.753 m)   Wt 98.4 kg (217 lb)   SpO2 96%   BMI 32.05 kg/m   Physical Exam  Constitutional: He appears well-developed and well-nourished. No distress.  HENT:  Head: Normocephalic and atraumatic.  Eyes: Conjunctivae are normal. Right eye exhibits no discharge. Left eye exhibits no discharge.  Neck: No JVD present. No tracheal deviation present.  Cardiovascular: Normal rate.   Pulmonary/Chest: Effort normal.  Abdominal: Soft. He exhibits no distension. There is tenderness.  Tenderness to palpation around the colostomy bag without erythema, induration, fluctuance.Colostomy bag in place with small amount of light brown liquid stool.   Musculoskeletal: He exhibits no edema.  Neurological: He is alert.  Skin: No erythema.  Psychiatric: He has a normal mood and affect. His behavior is normal.  Nursing note and vitals reviewed.   ED Course  Procedures  MDM Lab work is reassuring. CT scan of the abdomen and pelvis with impression as follows: 1. Fluid-filled distention of stomach and proximal small bowel without discrete transition point, may be secondary to enteritis or ileus. Early obstruction is not excluded. 2. Chronic stool ball just proximal to the left lower quadrant colostomy, decreased colonic wall thickening from prior exam. 3. Chronic bladder wall thickening. 4. Chronic  sacral and ischial decubitus ulcers, unchanged in appearance from prior.  With history of fecal emesis and inability to rule out small bowel obstruction on imaging, consulted general surgery. Spoke with Jerene Pitch with General Surgery who recommends placement of NG tube, admission to the medicine service and they will consult. Spoke with Sharene Butters PA-C with [facility] who agrees to assume care of patient under attending Dr. Marily Memos and bring him in for observation and further workup.      Renita Papa, PA-C 06/13/17 1039    Nat Christen, MD 06/16/17 1108

## 2017-06-13 NOTE — ED Notes (Signed)
Patient transported to CT 

## 2017-06-13 NOTE — Consult Note (Signed)
Enemas ordered, if needed please attach irrigation sleeve to current ostomy barrier.  [name] # for ostomy irrigation sleeve is 624 this will fit onto current barrier, will also need [name] # 641 ostomy clamp  [name] # for 2 pc pouches  # 2   (2 3/4 barrier) # 998 (2 3/4 pouch)  To administer enema via stoma, order irrigation kit Lawson # [id number] 2. Remove current pouch from ostomy barrier (snap two apart) 2. Attach irrigation sleeve to current ostomy barrier and use clamp closure to close off bottom of pouch  3. Fill irrigation sleeve with SMOG enema  4. Prime irrigation tubing  4. Lubricate cone tip of irrigation tubing 5. Gently insert cone tip into stoma and gentle release clamp on tubing allowing enema to flow into stoma, some will leak around 6. After instilling enema, close top of irrigation sleeve by folding over and bending tabs to secure 7. Allow to drain for 30-45 mins or longer 8. Remove irrigation sleeve if no further enemas ordered or if further enemas ordered ok to leave sleeve in place.   [place], [place], [place]

## 2017-06-13 NOTE — Consult Note (Signed)
[place] Nurse wound consult note Reason for Consult: sacral and ischial pressure injuries, chronic  Wound type: WOC did not visualize today, followed in the past admissions by [facility] team Pressure Injury POA: Yes Patient routinely will not allow packing of his wounds, therefore epibole of the wound edges has occurred and wound healing is not likely at this point. Today I discussed his wound care and he currently is using calcium alginate to cover "but not packed, it hurts too much" "just laid on top". Cover with gauze. At home he and his [facility] staff perform this dressing 3x week. I will place orders for this for him and I will re-evaluate wounds once he has inpatient bed. He prefers to not be turned on the ED stretcher due to discomfort. Dressing procedure/placement/frequency: Cleanse wounds sacral/ischial with saline, cover with calcium alginate, top with dry dressing. Change 3x wk M/W/F Low air loss mattress to be ordered for patient upon arrival to nursing floor.   [place] Nurse ostomy consult note Stoma type/location: LLQ, end colostomy Patient with known history of constipation related to narcotic use. He reports no real output for greater than 5 days with daily Mirlax use along with stool softeners over the last 5 days. Patient does have some stool in the pouch but most thin, liquid. Most like leakage from around current stool burden noted in his xrays Stomal assessment/size: did not visualize today Peristomal assessment: did not visualize today Treatment options for stomal/peristomal skin: NA Output see above Ostomy pouching: 2pc. 2 3/4" in place, patient reports he changes when needed, maybe less than once a week    [place] nurse will follow along with team for wound and ostomy support as needed. [place], [place], [place]

## 2017-06-13 NOTE — Progress Notes (Signed)
New Admission Note: Pt transferred to 2W38 from [facility] ED   Arrival Method: Via Bed Mental Orientation: Alert and oriented x 4 Telemetry: N/A Assessment: To be Completed Skin: See detailed assesment IV: L AC Pain: Abdominal pain secondary to constipation Tubes: None Safety Measures: Safety Fall Prevention Plan has been discussed  Admission: To be completed 6 [place] Orientation: Patient has been orientated to the room, unit and staff.  Family: None at the bedside  Orders to be reviewed and implemented. Will continue to monitor the patient. Call light has been placed within reach and bed alarm has been activated.   Mady Gemma, BSN, RN-BC Phone: [phone]

## 2017-06-13 NOTE — ED Notes (Signed)
Pt performing sterile self in&out cath

## 2017-06-13 NOTE — ED Notes (Signed)
Attempted Report x1.   
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
## 2017-06-14 DIAGNOSIS — R1084 Generalized abdominal pain: Secondary | ICD-10-CM

## 2017-06-14 DIAGNOSIS — I10 Essential (primary) hypertension: Secondary | ICD-10-CM

## 2017-06-14 DIAGNOSIS — E43 Unspecified severe protein-calorie malnutrition: Secondary | ICD-10-CM

## 2017-06-14 LAB — PROTIME-INR
INR: 1.06
Prothrombin Time: 13.7 seconds (ref 11.4–15.2)

## 2017-06-14 LAB — URINALYSIS, ROUTINE W REFLEX MICROSCOPIC
Bilirubin Urine: NEGATIVE
GLUCOSE, UA: NEGATIVE mg/dL
Hgb urine dipstick: NEGATIVE
Ketones, ur: 20 mg/dL — AB
NITRITE: NEGATIVE
PH: 9 — AB (ref 5.0–8.0)
Protein, ur: >=300 mg/dL — AB
SPECIFIC GRAVITY, URINE: 1.015 (ref 1.005–1.030)

## 2017-06-14 LAB — COMPREHENSIVE METABOLIC PANEL
ALBUMIN: 2.9 g/dL — AB (ref 3.5–5.0)
ALT: 12 U/L — AB (ref 17–63)
AST: 20 U/L (ref 15–41)
Alkaline Phosphatase: 50 U/L (ref 38–126)
Anion gap: 10 (ref 5–15)
BILIRUBIN TOTAL: 1.1 mg/dL (ref 0.3–1.2)
BUN: 20 mg/dL (ref 6–20)
CO2: 26 mmol/L (ref 22–32)
CREATININE: 1.12 mg/dL (ref 0.61–1.24)
Calcium: 8.3 mg/dL — ABNORMAL LOW (ref 8.9–10.3)
Chloride: 101 mmol/L (ref 101–111)
GFR calc Af Amer: >60 mL/min (ref >60)
GLUCOSE: 62 mg/dL — AB (ref 65–99)
Potassium: 3.7 mmol/L (ref 3.5–5.1)
Sodium: 137 mmol/L (ref 135–145)
TOTAL PROTEIN: 7.7 g/dL (ref 6.5–8.1)

## 2017-06-14 LAB — CBC
HEMATOCRIT: 36.8 % — AB (ref 39.0–52.0)
Hemoglobin: 11.3 g/dL — ABNORMAL LOW (ref 13.0–17.0)
MCH: 25.6 pg — ABNORMAL LOW (ref 26.0–34.0)
MCHC: 30.7 g/dL (ref 30.0–36.0)
MCV: 83.3 fL (ref 78.0–100.0)
PLATELETS: 291 10*3/uL (ref 150–400)
RBC: 4.42 MIL/uL (ref 4.22–5.81)
RDW: 14.8 % (ref 11.5–15.5)
WBC: 9.3 10*3/uL (ref 4.0–10.5)

## 2017-06-14 LAB — MRSA PCR SCREENING: MRSA BY PCR: NEGATIVE

## 2017-06-14 MED ORDER — INFLUENZA VAC SPLIT QUAD 0.5 ML IM SUSY
0.5000 mL | PREFILLED_SYRINGE | INTRAMUSCULAR | Status: AC
  Administered 2017-06-15: 0.5 mL via INTRAMUSCULAR
  Filled 2017-06-14: qty 0.5

## 2017-06-14 NOTE — Progress Notes (Signed)
OT Cancellation Note  Patient Details Name: Braddock Servellon MRN: [id number] DOB: Aug 25, 1966   Cancelled Treatment:    Reason Eval/Treat Not Completed: Patient declined, no reason specified. Pt reports he is currently does not feel well enough to sit EOB. Reports he recently worked with PT. Will reattempt as able.    Tyrone Schimke OTR/L Pager: [phone]  06/14/2017, 11:09 AM

## 2017-06-14 NOTE — Progress Notes (Signed)
Patient requested supplies to do self cath and colostomy was found to be leaking, at that time tech brought patient in/out cath and helped patient get cleaned up. Patient was informed that a new bag had been ordered. When this RN returned with a new colostomy bag, patient was very agitated that catheter supplies had been left out of reach. Complained he had "been treated real bad all day". Multiple bags ordered and at the bedside, multiple catheters left at bedside. Charge nurse will be notified.
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
## 2017-06-14 NOTE — Progress Notes (Signed)
After enema, miralax, and senna patient had 200 mL of output.
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
## 2017-06-14 NOTE — Progress Notes (Signed)
SMOG enema admin via stoma per order. Patient tolerated well. Will monitor output. Dorthey Sawyer, RN
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
## 2017-06-15 ENCOUNTER — Inpatient Hospital Stay (HOSPITAL_COMMUNITY): Payer: Medicare Other

## 2017-06-15 DIAGNOSIS — E876 Hypokalemia: Secondary | ICD-10-CM

## 2017-06-15 LAB — COMPREHENSIVE METABOLIC PANEL
ALT: 11 U/L — ABNORMAL LOW (ref 17–63)
AST: 14 U/L — AB (ref 15–41)
Albumin: 2.7 g/dL — ABNORMAL LOW (ref 3.5–5.0)
Alkaline Phosphatase: 49 U/L (ref 38–126)
Anion gap: 5 (ref 5–15)
BILIRUBIN TOTAL: 0.6 mg/dL (ref 0.3–1.2)
BUN: 14 mg/dL (ref 6–20)
CO2: 26 mmol/L (ref 22–32)
Calcium: 8 mg/dL — ABNORMAL LOW (ref 8.9–10.3)
Chloride: 106 mmol/L (ref 101–111)
Creatinine, Ser: 0.9 mg/dL (ref 0.61–1.24)
Glucose, Bld: 91 mg/dL (ref 65–99)
POTASSIUM: 3.2 mmol/L — AB (ref 3.5–5.1)
Sodium: 137 mmol/L (ref 135–145)
TOTAL PROTEIN: 6.4 g/dL — AB (ref 6.5–8.1)

## 2017-06-15 LAB — CBC WITH DIFFERENTIAL/PLATELET
BASOS ABS: 0 10*3/uL (ref 0.0–0.1)
Basophils Relative: 1 %
Eosinophils Absolute: 0.1 10*3/uL (ref 0.0–0.7)
Eosinophils Relative: 1 %
HEMATOCRIT: 33.4 % — AB (ref 39.0–52.0)
Hemoglobin: 10.3 g/dL — ABNORMAL LOW (ref 13.0–17.0)
LYMPHS ABS: 2.5 10*3/uL (ref 0.7–4.0)
Lymphocytes Relative: 40 %
MCH: 25.4 pg — AB (ref 26.0–34.0)
MCHC: 30.8 g/dL (ref 30.0–36.0)
MCV: 82.5 fL (ref 78.0–100.0)
MONOS PCT: 8 %
Monocytes Absolute: 0.5 10*3/uL (ref 0.1–1.0)
NEUTROS PCT: 50 %
Neutro Abs: 3.2 10*3/uL (ref 1.7–7.7)
Platelets: 237 10*3/uL (ref 150–400)
RBC: 4.05 MIL/uL — ABNORMAL LOW (ref 4.22–5.81)
RDW: 14.5 % (ref 11.5–15.5)
WBC: 6.3 10*3/uL (ref 4.0–10.5)

## 2017-06-15 LAB — PHOSPHORUS: PHOSPHORUS: 2.3 mg/dL — AB (ref 2.5–4.6)

## 2017-06-15 LAB — MAGNESIUM: MAGNESIUM: 1.7 mg/dL (ref 1.7–2.4)

## 2017-06-15 MED ORDER — ENOXAPARIN SODIUM 40 MG/0.4ML ~~LOC~~ SOLN
40.0000 mg | SUBCUTANEOUS | Status: DC
  Administered 2017-06-15 – 2017-06-17 (×3): 40 mg via SUBCUTANEOUS
  Filled 2017-06-15 (×4): qty 0.4

## 2017-06-15 MED ORDER — POTASSIUM PHOSPHATES 15 MMOLE/5ML IV SOLN
20.0000 mmol | Freq: Once | INTRAVENOUS | Status: AC
  Administered 2017-06-15: 20 mmol via INTRAVENOUS
  Filled 2017-06-15: qty 6.67

## 2017-06-15 MED ORDER — POTASSIUM CHLORIDE 10 MEQ/100ML IV SOLN
10.0000 meq | INTRAVENOUS | Status: AC
  Administered 2017-06-15 (×3): 10 meq via INTRAVENOUS
  Filled 2017-06-15 (×2): qty 100

## 2017-06-15 NOTE — Progress Notes (Signed)
Per pt, would like to keep all four side rales up at all times.  Rushie Goltz, RN
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
## 2017-06-15 NOTE — Plan of Care (Signed)
Problem: Health Behavior/Discharge Planning: Goal: Ability to manage health-related needs will improve Outcome: Progressing Patient aware of need to take stool softeners in order to improve GI motility.

## 2017-06-15 NOTE — Progress Notes (Signed)
OT Cancellation Note  Patient Details Name: [name] MRN: [id number] DOB: August 06, 1966   Cancelled Treatment:    Reason Eval/Treat Not Completed: Patient declined, no reason specified  Jaci Carrel 06/15/2017, 3:41 PM  Hulda Humphrey OTR/L [phone]

## 2017-06-15 NOTE — Progress Notes (Signed)
   Subjective/Chief Complaint: Ostomy working well   Objective: Vital signs in last 24 hours: Temp:  [98 F (36.7 C)-98.6 F (37 C)] 98.3 F (36.8 C) (09/14 0855) Pulse Rate:  [51-57] 54 (09/14 0855) Resp:  [18-19] 19 (09/14 0855) BP: (144-148)/(57-73) 146/73 (09/14 0855) SpO2:  [99 %-100 %] 99 % (09/14 0855) Weight:  [87.1 kg (192 lb 0.3 oz)-87.1 kg (192 lb 1.6 oz)] 87.1 kg (192 lb 0.3 oz) (09/14 0500) Last BM Date: 06/14/17 (continues to have small balls of stool )  Intake/Output from previous day: 09/13 0701 - 09/14 0700 In: 2881.7 [P.O.:480; I.V.:2401.7] Out: 875 [Urine:775; Stool:100] Intake/Output this shift: Total I/O In: -  Out: 750 [Stool:750]  Incision/Wound:ostomy viable and functioning   Lab Results:   Recent Labs  06/14/17 0300 06/15/17 0306  WBC 9.3 6.3  HGB 11.3* 10.3*  HCT 36.8* 33.4*  PLT 291 237   BMET  Recent Labs  06/14/17 0300 06/15/17 0306  NA 137 137  K 3.7 3.2*  CL 101 106  CO2 26 26  GLUCOSE 62* 91  BUN 20 14  CREATININE 1.12 0.90  CALCIUM 8.3* 8.0*   PT/INR  Recent Labs  06/14/17 0300  LABPROT 13.7  INR 1.06   ABG No results for input(s): PHART, HCO3 in the last 72 hours.  Invalid input(s): PCO2, PO2  Studies/Results: Dg Chest 2 View  Result Date: 06/13/2017 CLINICAL DATA:  Chest pain. EXAM: CHEST  2 VIEW COMPARISON:  July 19, 2016. FINDINGS: The cardiomediastinal silhouette is normal in size. Normal pulmonary vascularity. No focal consolidation, pleural effusion, or pneumothorax. No acute osseous abnormality. IMPRESSION: No active cardiopulmonary disease. Electronically Signed   By: Titus Dubin M.D.   On: 06/13/2017 10:10   Dg Abd Portable 1v  Result Date: 06/15/2017 CLINICAL DATA:  Abdominal pain. EXAM: PORTABLE ABDOMEN - 1 VIEW COMPARISON:  CT 06/13/2017.  Abdomen series 614 2018 . FINDINGS: Wiring noted over the left abdomen. Left lower quadrant ostomy site noted. Persistent but improved small bowel  distention. Colonic gas pattern is normal. No free air. Degenerative changes thoracic spine . Prior vertebroplasty. IMPRESSION: Persistent but improved small bowel distention. Colonic gas pattern is normal. Electronically Signed   By: Marcello Moores  Register   On: 06/15/2017 07:45    Anti-infectives: Anti-infectives    None      Assessment/Plan: Patient Active Problem List   Diagnosis Date Noted  . Vomiting feces   . Constipation 02/14/2017  . Colitis 02/13/2017  . Pressure injury of skin 02/13/2017  . Fecal impaction ([place]) 12/07/2016  . Cocaine abuse   . Decubitus ulcer of sacral region, stage 4 ([place])   . Urinary tract infection associated with indwelling urethral catheter ([place])   . Enteritis due to Clostridium difficile   . Hypocalcemia   . Hypomagnesemia 07/19/2016  . Abdominal pain 07/19/2016  . Osteomyelitis with necrosis of sacrum  06/01/2016  . Colostomy in place for fecal diversion 06/01/2016  . Protein-calorie malnutrition, severe ([place]) 05/31/2016  . Hypokalemia 05/26/2016  . Acute renal failure (ARF) ([place]) 05/26/2016  . Dehydration 05/26/2016  . Chronic pain 05/26/2016  . Leukocytosis 05/26/2016  . Decubitus ulcers 05/26/2016  . Wound infection 05/26/2016  . Anemia 05/26/2016  . Hypertension 05/26/2016  . Rhabdomyolysis 05/26/2016  . Paraplegia ([place])      Constipation improved   Will sign off    LOS: 2 days    Toma Arts A. 06/15/2017

## 2017-06-16 DIAGNOSIS — R3989 Other symptoms and signs involving the genitourinary system: Secondary | ICD-10-CM

## 2017-06-16 LAB — COMPREHENSIVE METABOLIC PANEL
ALK PHOS: 48 U/L (ref 38–126)
ALT: 10 U/L — ABNORMAL LOW (ref 17–63)
ANION GAP: 6 (ref 5–15)
AST: 17 U/L (ref 15–41)
Albumin: 2.7 g/dL — ABNORMAL LOW (ref 3.5–5.0)
BUN: 6 mg/dL (ref 6–20)
CO2: 24 mmol/L (ref 22–32)
Calcium: 7.9 mg/dL — ABNORMAL LOW (ref 8.9–10.3)
Chloride: 108 mmol/L (ref 101–111)
Creatinine, Ser: 0.85 mg/dL (ref 0.61–1.24)
GFR calc non Af Amer: >60 mL/min (ref >60)
Glucose, Bld: 93 mg/dL (ref 65–99)
POTASSIUM: 3.5 mmol/L (ref 3.5–5.1)
SODIUM: 138 mmol/L (ref 135–145)
TOTAL PROTEIN: 6.6 g/dL (ref 6.5–8.1)
Total Bilirubin: 0.4 mg/dL (ref 0.3–1.2)

## 2017-06-16 LAB — CBC WITH DIFFERENTIAL/PLATELET
BASOS PCT: 0 %
Basophils Absolute: 0 10*3/uL (ref 0.0–0.1)
EOS ABS: 0.1 10*3/uL (ref 0.0–0.7)
EOS PCT: 1 %
HCT: 35 % — ABNORMAL LOW (ref 39.0–52.0)
HEMOGLOBIN: 11.1 g/dL — AB (ref 13.0–17.0)
LYMPHS ABS: 2.1 10*3/uL (ref 0.7–4.0)
Lymphocytes Relative: 38 %
MCH: 26.3 pg (ref 26.0–34.0)
MCHC: 31.7 g/dL (ref 30.0–36.0)
MCV: 82.9 fL (ref 78.0–100.0)
MONO ABS: 0.5 10*3/uL (ref 0.1–1.0)
MONOS PCT: 9 %
Neutro Abs: 2.9 10*3/uL (ref 1.7–7.7)
Neutrophils Relative %: 52 %
Platelets: 230 10*3/uL (ref 150–400)
RBC: 4.22 MIL/uL (ref 4.22–5.81)
RDW: 14.7 % (ref 11.5–15.5)
WBC: 5.6 10*3/uL (ref 4.0–10.5)

## 2017-06-16 LAB — MAGNESIUM: Magnesium: 1.6 mg/dL — ABNORMAL LOW (ref 1.7–2.4)

## 2017-06-16 LAB — PHOSPHORUS: PHOSPHORUS: 2.7 mg/dL (ref 2.5–4.6)

## 2017-06-16 MED ORDER — PANTOPRAZOLE SODIUM 40 MG PO TBEC
40.0000 mg | DELAYED_RELEASE_TABLET | Freq: Every day | ORAL | Status: DC
  Administered 2017-06-17 – 2017-06-18 (×2): 40 mg via ORAL
  Filled 2017-06-16 (×2): qty 1

## 2017-06-16 MED ORDER — DEXTROSE 5 % IV SOLN
1.0000 g | INTRAVENOUS | Status: DC
  Administered 2017-06-16 – 2017-06-17 (×2): 1 g via INTRAVENOUS
  Filled 2017-06-16 (×3): qty 10

## 2017-06-16 MED ORDER — MAGNESIUM SULFATE 2 GM/50ML IV SOLN
2.0000 g | Freq: Once | INTRAVENOUS | Status: AC
  Administered 2017-06-16: 2 g via INTRAVENOUS
  Filled 2017-06-16 (×2): qty 50

## 2017-06-16 NOTE — Progress Notes (Signed)
Pt refused morning enema. Stated that the previous enema during the day hurt his abdomen. He said he might want the enama later on in the morning/day.
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
## 2017-06-16 NOTE — Progress Notes (Signed)
Patient refused SMOG enema. He stated that he might want it later today.
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
## 2017-06-17 DIAGNOSIS — K5641 Fecal impaction: Secondary | ICD-10-CM

## 2017-06-17 LAB — COMPREHENSIVE METABOLIC PANEL
ALBUMIN: 2.4 g/dL — AB (ref 3.5–5.0)
ALT: 9 U/L — ABNORMAL LOW (ref 17–63)
ANION GAP: 3 — AB (ref 5–15)
AST: 13 U/L — ABNORMAL LOW (ref 15–41)
Alkaline Phosphatase: 50 U/L (ref 38–126)
BILIRUBIN TOTAL: 0.3 mg/dL (ref 0.3–1.2)
BUN: <5 mg/dL — ABNORMAL LOW (ref 6–20)
CHLORIDE: 109 mmol/L (ref 101–111)
CO2: 27 mmol/L (ref 22–32)
Calcium: 7.9 mg/dL — ABNORMAL LOW (ref 8.9–10.3)
Creatinine, Ser: 0.75 mg/dL (ref 0.61–1.24)
GFR calc Af Amer: >60 mL/min (ref >60)
GFR calc non Af Amer: >60 mL/min (ref >60)
GLUCOSE: 87 mg/dL (ref 65–99)
POTASSIUM: 4.2 mmol/L (ref 3.5–5.1)
Sodium: 139 mmol/L (ref 135–145)
TOTAL PROTEIN: 6.2 g/dL — AB (ref 6.5–8.1)

## 2017-06-17 LAB — CBC WITH DIFFERENTIAL/PLATELET
BASOS ABS: 0 10*3/uL (ref 0.0–0.1)
BASOS PCT: 1 %
EOS ABS: 0.1 10*3/uL (ref 0.0–0.7)
EOS PCT: 3 %
HEMATOCRIT: 31.5 % — AB (ref 39.0–52.0)
Hemoglobin: 9.9 g/dL — ABNORMAL LOW (ref 13.0–17.0)
Lymphocytes Relative: 50 %
Lymphs Abs: 2.1 10*3/uL (ref 0.7–4.0)
MCH: 26.2 pg (ref 26.0–34.0)
MCHC: 31.4 g/dL (ref 30.0–36.0)
MCV: 83.3 fL (ref 78.0–100.0)
MONO ABS: 0.3 10*3/uL (ref 0.1–1.0)
MONOS PCT: 8 %
Neutro Abs: 1.6 10*3/uL — ABNORMAL LOW (ref 1.7–7.7)
Neutrophils Relative %: 38 %
PLATELETS: 227 10*3/uL (ref 150–400)
RBC: 3.78 MIL/uL — ABNORMAL LOW (ref 4.22–5.81)
RDW: 15.2 % (ref 11.5–15.5)
WBC: 4.1 10*3/uL (ref 4.0–10.5)

## 2017-06-17 LAB — MAGNESIUM: MAGNESIUM: 1.9 mg/dL (ref 1.7–2.4)

## 2017-06-17 LAB — URINE CULTURE

## 2017-06-17 LAB — PHOSPHORUS: Phosphorus: 2.7 mg/dL (ref 2.5–4.6)
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
## 2017-06-17 NOTE — Clinical Social Work Note (Signed)
Clinical Social Work Assessment  Patient Details  Name: [name] MRN: [id number] Date of Birth: 1966-04-10  Date of referral:  06/17/17               Reason for consult:  Facility Placement                Permission sought to share information with:  Facility Art therapist granted to share information::  Yes, Verbal Permission Granted  Name::        Agency::  SNF  Relationship::     Contact Information:     Housing/Transportation Living arrangements for the past 2 months:  Apartment Source of Information:  Patient Patient Interpreter Needed:  None Criminal Activity/Legal Involvement Pertinent to Current Situation/Hospitalization:  No - Comment as needed Significant Relationships:  Siblings Lives with:  Self, Pets Do you feel safe going back to the place where you live?  Yes Need for family participation in patient care:  No (Coment)  Care giving concerns:  Patient has been living at home alone, with a pet dog, but would currently benefit from short term rehab stay prior to returning home.   Social Worker assessment / plan:  CSW met with patient to discuss recommendation for SNF placement at this time. Patient indicated he had been at [facility] in the past and would prefer to go back there at discharge. Patient expressed concern to CSW about not being able to locate his keys so that his sister could go take care of his dog; patient discussed that they had taken his keys with his belongings when he left the ED to be admitted to the floor, and no one had returned his keys to him yet so that his sister could get into his apartment. CSW alerted RN; RN indicated she had paged security but no one had come. RN indicated she would page security again. CSW informed patient. CSW to fax out referral and follow up with bed availability.  Employment status:  Disabled (Comment on whether or not currently receiving Disability) Insurance information:  Medicare PT  Recommendations:  [place] / Referral to community resources:     Patient/Family's Response to care:  Patient agreeable to SNF placement at this time.  Patient/Family's Understanding of and Emotional Response to Diagnosis, Current Treatment, and Prognosis:  Patient was aware of recommendation for rehab at this time and was hopeful that it would help him. Patient discussed worry and concern about his dog being home alone for a long time without the ability to check on it or feed it. Patient expressed frustration at not knowing the status of locating his keys for him. Patient indicated understanding of CSW role in discharge planning and appreciated assistance in trying to locate his keys to take for his dog.  Emotional Assessment Appearance:  Appears stated age Attitude/Demeanor/Rapport:    Affect (typically observed):  Appropriate Orientation:  Oriented to Self, Oriented to Place, Oriented to  Time, Oriented to Situation Alcohol / Substance use:  Alcohol Use, Illicit Drugs Psych involvement (Current and /or in the community):  No (Comment)  Discharge Needs  Concerns to be addressed:  Care Coordination Readmission within the last 30 days:  No Current discharge risk:  Lives alone, Physical Impairment Barriers to Discharge:  Continued Medical Work up   [employer], [place] 06/17/2017, 2:53 PM

## 2017-06-17 NOTE — NC FL2 (Signed)
[place] MEDICAID FL2 LEVEL OF CARE SCREENING TOOL     IDENTIFICATION  Patient Name: [name] Birthdate: 12-Oct-1965 Sex: male Admission Date (Current Location): 06/13/2017  [facility] and [place] Number:  Herbalist and Address:  [facility] [place] [address], [place], [place] [address]      Provider Number: [id number]  Attending Physician Name and Address:  Kerney Elbe, DO  Relative Name and Phone Number:       Current Level of Care: Hospital Recommended Level of Care: [place] Prior Approval Number:    Date Approved/Denied:   PASRR Number: [id number]  Discharge Plan: SNF    Current Diagnoses: Patient Active Problem List   Diagnosis Date Noted  . Suspected UTI 06/16/2017  . Hypophosphatemia 06/15/2017  . Vomiting feces   . Constipation 02/14/2017  . Colitis 02/13/2017  . Pressure injury of skin 02/13/2017  . Fecal impaction ([place]) 12/07/2016  . Cocaine abuse   . Decubitus ulcer of sacral region, stage 4 ([place])   . Urinary tract infection associated with indwelling urethral catheter ([place])   . Enteritis due to Clostridium difficile   . Hypocalcemia   . Hypomagnesemia 07/19/2016  . Abdominal pain 07/19/2016  . Osteomyelitis with necrosis of sacrum  06/01/2016  . Colostomy in place for fecal diversion 06/01/2016  . Protein-calorie malnutrition, severe ([place]) 05/31/2016  . Hypokalemia 05/26/2016  . Acute renal failure (ARF) ([place]) 05/26/2016  . Dehydration 05/26/2016  . Chronic pain 05/26/2016  . Leukocytosis 05/26/2016  . Decubitus ulcers 05/26/2016  . Wound infection 05/26/2016  . Anemia 05/26/2016  . Hypertension 05/26/2016  . Rhabdomyolysis 05/26/2016  . Paraplegia (HCC)     Orientation RESPIRATION BLADDER Height & Weight     Self, Time, Situation, Place  Normal External catheter Weight: 197 lb 1.5 oz (89.4 kg) Height:  6' (182.9 cm)  BEHAVIORAL SYMPTOMS/MOOD NEUROLOGICAL BOWEL NUTRITION  STATUS      Colostomy (placed 05/30/16) Diet (cardiac)  AMBULATORY STATUS COMMUNICATION OF NEEDS Skin   Extensive Assist Verbally PU Stage and Appropriate Care PU Stage 1 Dressing:  (unstagable pressure injuries on ischial tuberosity, coccyx, scrotum; alginate and foam dressing, changed MWF)                     Personal Care Assistance Level of Assistance  Bathing, Dressing Bathing Assistance: Maximum assistance   Dressing Assistance: Maximum assistance     Functional Limitations Info  Sight Sight Info: Impaired (R eye prosthesis)        SPECIAL CARE FACTORS FREQUENCY  PT (By licensed PT), OT (By licensed OT)     PT Frequency: 5x/wk OT Frequency: 5x/wk            Contractures      Additional Factors Info  Code Status, Allergies, Isolation Precautions Code Status Info: Full Allergies Info: NKA     Isolation Precautions Info: MRSA     Current Medications (06/17/2017):  This is the current hospital active medication list Current Facility-Administered Medications  Medication Dose Route Frequency Provider Last Rate Last Dose  . acetaminophen (TYLENOL) tablet 650 mg  650 mg Oral Q6H PRN Rondel Jumbo, PA-C       Or  . acetaminophen (TYLENOL) suppository 650 mg  650 mg Rectal Q6H PRN Rondel Jumbo, PA-C      . cefTRIAXone (ROCEPHIN) 1 g in dextrose 5 % 50 mL IVPB  1 g Intravenous Q24H Sheikh, Omair [place], DO   Stopped  at 06/16/17 2135  . enoxaparin (LOVENOX) injection 40 mg  40 mg Subcutaneous Q24H Raiford Noble [place], DO   40 mg at 06/16/17 2105  . folic acid (FOLVITE) tablet 1 mg  1 mg Oral Daily Rondel Jumbo, PA-C   1 mg at 06/17/17 1022  . ketorolac (TORADOL) 30 MG/ML injection 30 mg  30 mg Intravenous Q6H PRN Rondel Jumbo, PA-C      . LORazepam (ATIVAN) injection 1 mg  1 mg Intravenous Q6H PRN Rondel Jumbo, PA-C      . LORazepam (ATIVAN) tablet 1 mg  1 mg Oral Q6H PRN Rondel Jumbo, PA-C      . morphine 4 MG/ML injection 4 mg  4 mg Intravenous Q4H  PRN Rondel Jumbo, PA-C   4 mg at 06/17/17 0219  . multivitamin with minerals tablet 1 tablet  1 tablet Oral Daily Rondel Jumbo, PA-C   1 tablet at 06/17/17 1022  . ondansetron (ZOFRAN) tablet 4 mg  4 mg Oral Q6H PRN Rondel Jumbo, PA-C       Or  . ondansetron [facility]) injection 4 mg  4 mg Intravenous Q6H PRN Sharene Butters E, PA-C   4 mg at 06/15/17 1727  . oxyCODONE-acetaminophen (PERCOCET/ROXICET) 5-325 MG per tablet 1 tablet  1 tablet Oral Q6H PRN Skeet Simmer, RPH   1 tablet at 06/17/17 1022   And  . oxyCODONE (Oxy IR/ROXICODONE) immediate release tablet 5 mg  5 mg Oral Q6H PRN Skeet Simmer, RPH   5 mg at 06/17/17 1022  . pantoprazole (PROTONIX) EC tablet 40 mg  40 mg Oral Daily Raiford Noble [place], DO   40 mg at 06/17/17 1022  . polyethylene glycol (MIRALAX / GLYCOLAX) packet 17 g  17 g Oral BID Rondel Jumbo, PA-C   17 g at 06/17/17 1022  . senna-docusate (Senokot-S) tablet 1 tablet  1 tablet Oral BID Rondel Jumbo, PA-C   1 tablet at 06/17/17 1022  . sorbitol, milk of mag, mineral oil, glycerin (SMOG) enema  960 mL Rectal BID Meuth, Brooke A, PA-C   960 mL at 06/15/17 1649  . thiamine (VITAMIN B-1) tablet 100 mg  100 mg Oral Daily Sharene Butters E, PA-C   100 mg at 06/17/17 1022  . traMADol (ULTRAM) tablet 50 mg  50 mg Oral Q6H PRN Rondel Jumbo, PA-C      . traZODone (DESYREL) tablet 25 mg  25 mg Oral QHS PRN Rondel Jumbo, PA-C         Discharge Medications: Please see discharge summary for a list of discharge medications.  Relevant Imaging Results:  Relevant Lab Results:   Additional Information SS#: [id number]  Geralynn Ochs, LCSW

## 2017-06-18 LAB — CBC WITH DIFFERENTIAL/PLATELET
Basophils Absolute: 0 10*3/uL (ref 0.0–0.1)
Basophils Relative: 1 %
EOS ABS: 0.2 10*3/uL (ref 0.0–0.7)
EOS PCT: 3 %
HCT: 32.8 % — ABNORMAL LOW (ref 39.0–52.0)
HEMOGLOBIN: 10.4 g/dL — AB (ref 13.0–17.0)
Lymphocytes Relative: 46 %
Lymphs Abs: 2.5 10*3/uL (ref 0.7–4.0)
MCH: 26.5 pg (ref 26.0–34.0)
MCHC: 31.7 g/dL (ref 30.0–36.0)
MCV: 83.5 fL (ref 78.0–100.0)
Monocytes Absolute: 0.4 10*3/uL (ref 0.1–1.0)
Monocytes Relative: 8 %
NEUTROS PCT: 42 %
Neutro Abs: 2.2 10*3/uL (ref 1.7–7.7)
PLATELETS: 218 10*3/uL (ref 150–400)
RBC: 3.93 MIL/uL — AB (ref 4.22–5.81)
RDW: 15.3 % (ref 11.5–15.5)
WBC: 5.3 10*3/uL (ref 4.0–10.5)

## 2017-06-18 LAB — COMPREHENSIVE METABOLIC PANEL
ALBUMIN: 2.6 g/dL — AB (ref 3.5–5.0)
ALT: 9 U/L — AB (ref 17–63)
AST: 13 U/L — AB (ref 15–41)
Alkaline Phosphatase: 54 U/L (ref 38–126)
Anion gap: 4 — ABNORMAL LOW (ref 5–15)
BUN: 8 mg/dL (ref 6–20)
CHLORIDE: 108 mmol/L (ref 101–111)
CO2: 25 mmol/L (ref 22–32)
CREATININE: 0.86 mg/dL (ref 0.61–1.24)
Calcium: 8 mg/dL — ABNORMAL LOW (ref 8.9–10.3)
GFR calc Af Amer: >60 mL/min (ref >60)
GFR calc non Af Amer: >60 mL/min (ref >60)
GLUCOSE: 93 mg/dL (ref 65–99)
Potassium: 4.5 mmol/L (ref 3.5–5.1)
SODIUM: 137 mmol/L (ref 135–145)
Total Bilirubin: 0.2 mg/dL — ABNORMAL LOW (ref 0.3–1.2)
Total Protein: 6.4 g/dL — ABNORMAL LOW (ref 6.5–8.1)

## 2017-06-18 LAB — URINE CULTURE

## 2017-06-18 LAB — PHOSPHORUS: PHOSPHORUS: 3.5 mg/dL (ref 2.5–4.6)

## 2017-06-18 LAB — MAGNESIUM: Magnesium: 1.9 mg/dL (ref 1.7–2.4)

## 2017-06-18 MED ORDER — ONDANSETRON HCL 4 MG PO TABS: 4.0000 mg | ORAL_TABLET | Freq: Four times a day (QID) | ORAL | 0 refills | Status: DC | PRN

## 2017-06-18 MED ORDER — CEFUROXIME AXETIL 250 MG PO TABS: 250.0000 mg | ORAL_TABLET | Freq: Two times a day (BID) | ORAL | 0 refills | Status: DC

## 2017-06-18 MED ORDER — OXYCODONE-ACETAMINOPHEN 10-325 MG PO TABS: 1.0000 | ORAL_TABLET | Freq: Four times a day (QID) | ORAL | 0 refills | Status: DC | PRN

## 2017-06-18 MED ORDER — PANTOPRAZOLE SODIUM 40 MG PO TBEC: 40.0000 mg | DELAYED_RELEASE_TABLET | Freq: Every day | ORAL | 0 refills | Status: DC

## 2017-06-18 MED ORDER — CEFUROXIME AXETIL 500 MG PO TABS
250.0000 mg | ORAL_TABLET | Freq: Two times a day (BID) | ORAL | Status: DC
  Administered 2017-06-18: 250 mg via ORAL
  Filled 2017-06-18: qty 1

## 2017-06-18 MED ORDER — ADULT MULTIVITAMIN W/MINERALS CH: 1.0000 | ORAL_TABLET | Freq: Every day | ORAL | 0 refills | Status: DC

## 2017-06-18 MED ORDER — FOLIC ACID 1 MG PO TABS: 1.0000 mg | ORAL_TABLET | Freq: Every day | ORAL | 0 refills | Status: DC

## 2017-06-18 MED ORDER — THIAMINE HCL 100 MG PO TABS: 100.0000 mg | ORAL_TABLET | Freq: Every day | ORAL | 0 refills | Status: DC

## 2017-06-18 NOTE — Progress Notes (Addendum)
Pt alert & oriented x4. Discharged orders and prescriptions given and explained. IV removed. Advised if he has fever to return to the ED.

## 2017-06-18 NOTE — Significant Event (Signed)
Since the accepting facility will need physical prescription with physician's signature for narcotics I have given the prescription as advised by my colleague in the discharge summary for oxycodone/acetaminophen 10/325 mg by mouth every 6 when necessary total of 10 tablets with no refills.  [name].

## 2017-06-18 NOTE — Progress Notes (Signed)
Paged MD regarding switching IV abx to po. Pt keeps pulling out IV and even unscrewing the line from the IV this shift. Pt is only taking IV Rocephin and nurse saw in MD notes that pt may discharge today or tomorrow to SNF. Awaiting response.  Eleanora Neighbor, RN
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
## 2017-06-18 NOTE — Care Management Important Message (Signed)
Important Message  Patient Details  Name: [name] MRN: [id number] Date of Birth: 1966/04/05   Medicare Important Message Given:  Yes    Aviyana Sonntag Abena 06/18/2017, 10:36 AM

## 2017-06-18 NOTE — Care Management Note (Signed)
Case Management Note  Patient Details  Name: Quintan Saldivar MRN: [id number] Date of Birth: 1965/11/27  Subjective/Objective:     CM following for progression and d/c planning.                Action/Plan: 06/18/2017 Pt now planning on d/c to home rather than SNF. Pt has contacted his PCS aide from [place] to inform her of his planned D/C this pm. Pt apartment keys have been misplaced since his admission to the hospital , the pt has contacted his landlord who will bring a key to the hospital for the pt to use per pt. After the pt receives his keys, we will call the ambulance to transport this pt home. Ambulance form complete and placed with chart for d/c.  Pt states that he has [place] services with [facility]. This CM contacted that agency re resumption of [facility] services. Order to resume services entered and orders and d/c summary faxed to [facility] at [phone]. Staff will call ambulance once pt has his keys.   Expected Discharge Date:  06/18/17               Expected Discharge Plan:  [place]  In-House Referral:  Clinical Social Work  Discharge planning Services  CM Consult  Post Acute Care Choice:  Home Health Choice offered to:  Patient  DME Arranged:  N/A DME Agency:  NA  HH Arranged:  RN [place] Agency:  [facility]  Status of Service:  Completed, signed off  If discussed at [employer] of Stay Meetings, dates discussed:    Additional Comments:  Adron Bene, RN 06/18/2017, 4:50 PM

## 2017-06-18 NOTE — Clinical Social Work Note (Signed)
CSW talked with patient at the bedside regarding the facility responses. His choice was [name], however after learning that they would not accept him due to current drug use documentation in chart, patient indicated that he would d/c home. When asked, he did not want CSW to call other 2 facilities that had offered a bed. CSW signing off as no other SW intervention services needed at this time, and patient is discharging home this afternoon.  Margree Gimbel Givens, MSW, LCSW Licensed Clinical Social Worker [place] [phone]

## 2017-06-27 ENCOUNTER — Encounter (HOSPITAL_BASED_OUTPATIENT_CLINIC_OR_DEPARTMENT_OTHER): Payer: Medicare Other

## 2017-07-31 ENCOUNTER — Encounter (HOSPITAL_BASED_OUTPATIENT_CLINIC_OR_DEPARTMENT_OTHER): Payer: Medicare Other | Attending: Surgery

## 2017-07-31 DIAGNOSIS — L89324 Pressure ulcer of left buttock, stage 4: Secondary | ICD-10-CM | POA: Diagnosis not present

## 2017-07-31 DIAGNOSIS — Z933 Colostomy status: Secondary | ICD-10-CM | POA: Insufficient documentation

## 2017-07-31 DIAGNOSIS — I10 Essential (primary) hypertension: Secondary | ICD-10-CM | POA: Insufficient documentation

## 2017-07-31 DIAGNOSIS — G8222 Paraplegia, incomplete: Secondary | ICD-10-CM | POA: Insufficient documentation

## 2017-07-31 DIAGNOSIS — L89154 Pressure ulcer of sacral region, stage 4: Secondary | ICD-10-CM | POA: Diagnosis not present

## 2017-07-31 DIAGNOSIS — F17210 Nicotine dependence, cigarettes, uncomplicated: Secondary | ICD-10-CM | POA: Diagnosis not present

## 2017-07-31 DIAGNOSIS — L89314 Pressure ulcer of right buttock, stage 4: Secondary | ICD-10-CM | POA: Diagnosis not present

## 2017-07-31 DIAGNOSIS — Z9049 Acquired absence of other specified parts of digestive tract: Secondary | ICD-10-CM | POA: Diagnosis not present

## 2017-08-15 ENCOUNTER — Encounter (HOSPITAL_BASED_OUTPATIENT_CLINIC_OR_DEPARTMENT_OTHER): Payer: Medicare Other

## 2017-08-28 ENCOUNTER — Encounter (HOSPITAL_BASED_OUTPATIENT_CLINIC_OR_DEPARTMENT_OTHER): Payer: Medicare Other | Attending: Surgery

## 2017-08-28 DIAGNOSIS — L89154 Pressure ulcer of sacral region, stage 4: Secondary | ICD-10-CM | POA: Diagnosis not present

## 2017-08-28 DIAGNOSIS — G8222 Paraplegia, incomplete: Secondary | ICD-10-CM | POA: Insufficient documentation

## 2017-08-28 DIAGNOSIS — I10 Essential (primary) hypertension: Secondary | ICD-10-CM | POA: Insufficient documentation

## 2017-08-28 DIAGNOSIS — L89314 Pressure ulcer of right buttock, stage 4: Secondary | ICD-10-CM | POA: Diagnosis present

## 2017-08-28 DIAGNOSIS — F17210 Nicotine dependence, cigarettes, uncomplicated: Secondary | ICD-10-CM | POA: Diagnosis not present

## 2017-08-28 DIAGNOSIS — L89324 Pressure ulcer of left buttock, stage 4: Secondary | ICD-10-CM | POA: Insufficient documentation

## 2017-09-18 ENCOUNTER — Encounter (HOSPITAL_BASED_OUTPATIENT_CLINIC_OR_DEPARTMENT_OTHER): Payer: Medicare Other | Attending: Internal Medicine

## 2017-10-08 ENCOUNTER — Encounter (HOSPITAL_BASED_OUTPATIENT_CLINIC_OR_DEPARTMENT_OTHER): Payer: Medicare Other | Attending: Internal Medicine

## 2017-10-08 DIAGNOSIS — F17218 Nicotine dependence, cigarettes, with other nicotine-induced disorders: Secondary | ICD-10-CM | POA: Diagnosis not present

## 2017-10-08 DIAGNOSIS — L89314 Pressure ulcer of right buttock, stage 4: Secondary | ICD-10-CM | POA: Diagnosis not present

## 2017-10-08 DIAGNOSIS — L89154 Pressure ulcer of sacral region, stage 4: Secondary | ICD-10-CM | POA: Insufficient documentation

## 2017-10-08 DIAGNOSIS — L89324 Pressure ulcer of left buttock, stage 4: Secondary | ICD-10-CM | POA: Diagnosis present

## 2017-10-08 DIAGNOSIS — G8222 Paraplegia, incomplete: Secondary | ICD-10-CM | POA: Insufficient documentation

## 2017-10-17 DIAGNOSIS — L89324 Pressure ulcer of left buttock, stage 4: Secondary | ICD-10-CM | POA: Diagnosis not present

## 2017-11-14 ENCOUNTER — Encounter (HOSPITAL_BASED_OUTPATIENT_CLINIC_OR_DEPARTMENT_OTHER): Payer: Medicare Other | Attending: Physician Assistant

## 2017-11-14 DIAGNOSIS — F17210 Nicotine dependence, cigarettes, uncomplicated: Secondary | ICD-10-CM | POA: Diagnosis not present

## 2017-11-14 DIAGNOSIS — L89154 Pressure ulcer of sacral region, stage 4: Secondary | ICD-10-CM | POA: Insufficient documentation

## 2017-11-14 DIAGNOSIS — L89314 Pressure ulcer of right buttock, stage 4: Secondary | ICD-10-CM | POA: Insufficient documentation

## 2017-11-14 DIAGNOSIS — G8222 Paraplegia, incomplete: Secondary | ICD-10-CM | POA: Insufficient documentation

## 2017-11-14 DIAGNOSIS — I10 Essential (primary) hypertension: Secondary | ICD-10-CM | POA: Diagnosis not present

## 2017-11-14 DIAGNOSIS — L89324 Pressure ulcer of left buttock, stage 4: Secondary | ICD-10-CM | POA: Diagnosis not present

## 2017-11-14 DIAGNOSIS — L89892 Pressure ulcer of other site, stage 2: Secondary | ICD-10-CM | POA: Insufficient documentation

## 2017-11-27 ENCOUNTER — Encounter (HOSPITAL_COMMUNITY): Payer: Self-pay | Admitting: Family Medicine

## 2017-11-27 ENCOUNTER — Emergency Department (HOSPITAL_COMMUNITY)
Admission: EM | Admit: 2017-11-27 | Discharge: 2017-11-28 | Disposition: A | Discharge status: Home / Self Care | Payer: Medicare Other | Attending: Emergency Medicine | Admitting: Emergency Medicine

## 2017-11-27 ENCOUNTER — Emergency Department (HOSPITAL_COMMUNITY): Payer: Medicare Other

## 2017-11-27 DIAGNOSIS — R1084 Generalized abdominal pain: Secondary | ICD-10-CM | POA: Diagnosis present

## 2017-11-27 DIAGNOSIS — Z79899 Other long term (current) drug therapy: Secondary | ICD-10-CM | POA: Diagnosis not present

## 2017-11-27 DIAGNOSIS — F17210 Nicotine dependence, cigarettes, uncomplicated: Secondary | ICD-10-CM | POA: Insufficient documentation

## 2017-11-27 DIAGNOSIS — K5903 Drug induced constipation: Secondary | ICD-10-CM | POA: Diagnosis not present

## 2017-11-27 DIAGNOSIS — I10 Essential (primary) hypertension: Secondary | ICD-10-CM | POA: Diagnosis not present

## 2017-11-27 DIAGNOSIS — T402X5A Adverse effect of other opioids, initial encounter: Secondary | ICD-10-CM | POA: Insufficient documentation

## 2017-11-27 DIAGNOSIS — Z933 Colostomy status: Secondary | ICD-10-CM | POA: Diagnosis not present

## 2017-11-27 LAB — CBC WITH DIFFERENTIAL/PLATELET
Basophils Absolute: 0 10*3/uL (ref 0.0–0.1)
Basophils Relative: 0 %
Eosinophils Absolute: 0.1 10*3/uL (ref 0.0–0.7)
Eosinophils Relative: 1 %
HEMATOCRIT: 34.2 % — AB (ref 39.0–52.0)
HEMOGLOBIN: 10.6 g/dL — AB (ref 13.0–17.0)
LYMPHS ABS: 2.3 10*3/uL (ref 0.7–4.0)
LYMPHS PCT: 26 %
MCH: 26.5 pg (ref 26.0–34.0)
MCHC: 31 g/dL (ref 30.0–36.0)
MCV: 85.5 fL (ref 78.0–100.0)
MONOS PCT: 4 %
Monocytes Absolute: 0.4 10*3/uL (ref 0.1–1.0)
NEUTROS ABS: 6 10*3/uL (ref 1.7–7.7)
NEUTROS PCT: 69 %
Platelets: 299 10*3/uL (ref 150–400)
RBC: 4 MIL/uL — AB (ref 4.22–5.81)
RDW: 15.6 % — ABNORMAL HIGH (ref 11.5–15.5)
WBC: 8.9 10*3/uL (ref 4.0–10.5)

## 2017-11-27 LAB — COMPREHENSIVE METABOLIC PANEL
ALBUMIN: 3.2 g/dL — AB (ref 3.5–5.0)
ALK PHOS: 74 U/L (ref 38–126)
ALT: 21 U/L (ref 17–63)
ANION GAP: 8 (ref 5–15)
AST: 28 U/L (ref 15–41)
BUN: 13 mg/dL (ref 6–20)
CALCIUM: 8.5 mg/dL — AB (ref 8.9–10.3)
CO2: 26 mmol/L (ref 22–32)
Chloride: 105 mmol/L (ref 101–111)
Creatinine, Ser: 0.81 mg/dL (ref 0.61–1.24)
GFR calc Af Amer: >60 mL/min (ref >60)
GFR calc non Af Amer: >60 mL/min (ref >60)
GLUCOSE: 84 mg/dL (ref 65–99)
POTASSIUM: 3.8 mmol/L (ref 3.5–5.1)
SODIUM: 139 mmol/L (ref 135–145)
Total Bilirubin: 0.4 mg/dL (ref 0.3–1.2)
Total Protein: 8 g/dL (ref 6.5–8.1)

## 2017-11-27 LAB — URINALYSIS, ROUTINE W REFLEX MICROSCOPIC
BILIRUBIN URINE: NEGATIVE
Glucose, UA: NEGATIVE mg/dL
KETONES UR: NEGATIVE mg/dL
Nitrite: NEGATIVE
PROTEIN: NEGATIVE mg/dL
Specific Gravity, Urine: 1.017 (ref 1.005–1.030)
Squamous Epithelial / LPF: NONE SEEN
pH: 5 (ref 5.0–8.0)

## 2017-11-27 LAB — LIPASE, BLOOD: Lipase: 29 U/L (ref 11–51)

## 2017-11-27 MED ORDER — SODIUM CHLORIDE 0.9 % IV BOLUS (SEPSIS)
1000.0000 mL | Freq: Once | INTRAVENOUS | Status: AC
  Administered 2017-11-27: 1000 mL via INTRAVENOUS

## 2017-11-27 MED ORDER — KETOROLAC TROMETHAMINE 15 MG/ML IJ SOLN
15.0000 mg | Freq: Once | INTRAMUSCULAR | Status: AC
  Administered 2017-11-27: 15 mg via INTRAVENOUS
  Filled 2017-11-27: qty 1

## 2017-11-27 MED ORDER — IOPAMIDOL (ISOVUE-300) INJECTION 61%
INTRAVENOUS | Status: AC
  Administered 2017-11-28: 100 mL via INTRAVENOUS
  Filled 2017-11-27: qty 100

## 2017-11-27 MED ORDER — SODIUM CHLORIDE 0.9 % IV SOLN
INTRAVENOUS | Status: DC
  Administered 2017-11-27: 23:00:00 via INTRAVENOUS

## 2017-11-27 MED ORDER — SODIUM CHLORIDE 0.9 % IJ SOLN
INTRAMUSCULAR | Status: AC
  Filled 2017-11-27: qty 50

## 2017-11-27 MED ORDER — ONDANSETRON HCL 4 MG/2ML IJ SOLN
4.0000 mg | Freq: Once | INTRAMUSCULAR | Status: AC
  Administered 2017-11-27: 4 mg via INTRAVENOUS
  Filled 2017-11-27: qty 2

## 2017-11-27 MED ORDER — IOPAMIDOL (ISOVUE-300) INJECTION 61%
INTRAVENOUS | Status: AC
  Administered 2017-11-27: 30 mL via ORAL
  Filled 2017-11-27: qty 30

## 2017-11-27 NOTE — ED Notes (Signed)
When going to reassess patient, he was upset that he got diverted from [facility] ED to [facility] ED. Patient is appears in no acute distress. However, he removed his ostomy bag from site and smeared feces around the bed. Attempted to provide information about how he arrived here between EMS and [facility] ED. He refused to listen to my explanation and requested to speak with upper management. Terri, RN Hydrographic surveyor) spoke with patient about his arrival process. Meanwhile, I cleaned around patients stoma and applied a new colostomy bag (Ref [id number]). Also, provided clean blanket. Also, patient smoke with Yvetta Coder, RN (Night AC). At the end of visit, while obtaining vital signs, he received a phone call on his cell. He is reporting his pain 10/10 while laughing with the color on the cellphone.
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
## 2017-11-27 NOTE — ED Notes (Signed)
Bed: WA21 Expected date:  Expected time:  Means of arrival:  Comments: EMS-colonoscopy issue
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
## 2017-11-28 ENCOUNTER — Emergency Department (HOSPITAL_COMMUNITY): Payer: Medicare Other

## 2017-11-28 ENCOUNTER — Encounter (HOSPITAL_COMMUNITY): Payer: Self-pay

## 2017-11-28 DIAGNOSIS — K5903 Drug induced constipation: Secondary | ICD-10-CM | POA: Diagnosis not present

## 2017-11-28 MED ORDER — OXYCODONE-ACETAMINOPHEN 5-325 MG PO TABS: 1.0000 | ORAL_TABLET | Freq: Three times a day (TID) | ORAL | 0 refills | Status: DC | PRN

## 2017-11-28 MED ORDER — RANITIDINE HCL 300 MG PO TABS: 300.0000 mg | ORAL_TABLET | Freq: Two times a day (BID) | ORAL | 0 refills | Status: DC

## 2017-11-28 MED ORDER — FENTANYL CITRATE (PF) 100 MCG/2ML IJ SOLN
100.0000 ug | Freq: Once | INTRAMUSCULAR | Status: AC
  Administered 2017-11-28: 100 ug via INTRAVENOUS
  Filled 2017-11-28: qty 2

## 2017-11-28 MED ORDER — IOPAMIDOL (ISOVUE-300) INJECTION 61%
100.0000 mL | Freq: Once | INTRAVENOUS | Status: AC | PRN
  Administered 2017-11-28: 100 mL via INTRAVENOUS

## 2017-11-28 NOTE — ED Provider Notes (Signed)
Assumed care at sign out of this patient to follow-up with CT imaging CT scan revealed reveal any acute abdominal emergency He is taking p.o.  He is in no distress, watching television.  I feel he is safe for discharge.  Patient informed me his PCP is no longer available.  He reports he called his PCPs office and was told that he is closed permanently.  He currently has no PCP.  Patient obtains monthly chronic pain medications due to his painful conditions This was confirmed on narcotic database and reviewed I have given resources for PCP referrals.  Also placed consult to case management and social work.  I have also agreed to give patient a short course of pain medications to bridge him until he is able to find a primary doctor.  I discussed at length the limitations on providing narcotic medications in the emergency department.  Also discussed at length with the patient that  The  emergency department cannot provide narcotic medication refills on an ongoing basis after today Due to the unusual circumstance, though I feel it is prudent to prescribe short course of narcotics at this time.    Ripley Fraise, MD 11/28/17 [phone]

## 2017-11-28 NOTE — ED Notes (Signed)
[facility] called for transportation  

## 2017-11-28 NOTE — Care Management Note (Signed)
Case Management Note  CM consulted for no pcp.  Pt was D/C with several PCP options on his AVS.  Pt is able to contact them to schedule an appointment as he needs.  No further CM needs noted at this time.

## 2017-11-28 NOTE — ED Notes (Signed)
AVS reviewed with patient, pt given opportunity to ask questions. Pt a/o x4, refuses to sign for discharge.

## 2017-12-12 ENCOUNTER — Encounter (HOSPITAL_BASED_OUTPATIENT_CLINIC_OR_DEPARTMENT_OTHER): Payer: Medicare Other | Attending: Physician Assistant

## 2017-12-12 DIAGNOSIS — L89892 Pressure ulcer of other site, stage 2: Secondary | ICD-10-CM | POA: Insufficient documentation

## 2017-12-12 DIAGNOSIS — L89154 Pressure ulcer of sacral region, stage 4: Secondary | ICD-10-CM | POA: Insufficient documentation

## 2017-12-12 DIAGNOSIS — Z933 Colostomy status: Secondary | ICD-10-CM | POA: Insufficient documentation

## 2017-12-12 DIAGNOSIS — L89314 Pressure ulcer of right buttock, stage 4: Secondary | ICD-10-CM | POA: Insufficient documentation

## 2017-12-12 DIAGNOSIS — G8222 Paraplegia, incomplete: Secondary | ICD-10-CM | POA: Insufficient documentation

## 2017-12-12 DIAGNOSIS — Z9049 Acquired absence of other specified parts of digestive tract: Secondary | ICD-10-CM | POA: Insufficient documentation

## 2017-12-12 DIAGNOSIS — F17210 Nicotine dependence, cigarettes, uncomplicated: Secondary | ICD-10-CM | POA: Insufficient documentation

## 2017-12-12 DIAGNOSIS — L89324 Pressure ulcer of left buttock, stage 4: Secondary | ICD-10-CM | POA: Insufficient documentation

## 2017-12-12 DIAGNOSIS — I10 Essential (primary) hypertension: Secondary | ICD-10-CM | POA: Diagnosis not present

## 2018-01-09 ENCOUNTER — Encounter (HOSPITAL_BASED_OUTPATIENT_CLINIC_OR_DEPARTMENT_OTHER): Payer: Medicare Other | Attending: Physician Assistant

## 2018-01-09 DIAGNOSIS — I10 Essential (primary) hypertension: Secondary | ICD-10-CM | POA: Insufficient documentation

## 2018-01-09 DIAGNOSIS — G8222 Paraplegia, incomplete: Secondary | ICD-10-CM | POA: Insufficient documentation

## 2018-01-09 DIAGNOSIS — L89314 Pressure ulcer of right buttock, stage 4: Secondary | ICD-10-CM | POA: Diagnosis not present

## 2018-01-09 DIAGNOSIS — L89324 Pressure ulcer of left buttock, stage 4: Secondary | ICD-10-CM | POA: Insufficient documentation

## 2018-01-09 DIAGNOSIS — S3130XA Unspecified open wound of scrotum and testes, initial encounter: Secondary | ICD-10-CM | POA: Insufficient documentation

## 2018-01-09 DIAGNOSIS — X58XXXA Exposure to other specified factors, initial encounter: Secondary | ICD-10-CM | POA: Insufficient documentation

## 2018-01-09 DIAGNOSIS — F17210 Nicotine dependence, cigarettes, uncomplicated: Secondary | ICD-10-CM | POA: Insufficient documentation

## 2018-01-09 DIAGNOSIS — L89154 Pressure ulcer of sacral region, stage 4: Secondary | ICD-10-CM | POA: Diagnosis not present

## 2018-02-06 ENCOUNTER — Encounter (HOSPITAL_BASED_OUTPATIENT_CLINIC_OR_DEPARTMENT_OTHER): Payer: Medicare Other | Attending: Internal Medicine

## 2018-02-06 DIAGNOSIS — L89154 Pressure ulcer of sacral region, stage 4: Secondary | ICD-10-CM | POA: Diagnosis not present

## 2018-02-06 DIAGNOSIS — G8222 Paraplegia, incomplete: Secondary | ICD-10-CM | POA: Diagnosis not present

## 2018-02-06 DIAGNOSIS — F17200 Nicotine dependence, unspecified, uncomplicated: Secondary | ICD-10-CM | POA: Diagnosis not present

## 2018-02-06 DIAGNOSIS — L89892 Pressure ulcer of other site, stage 2: Secondary | ICD-10-CM | POA: Diagnosis not present

## 2018-02-06 DIAGNOSIS — L89314 Pressure ulcer of right buttock, stage 4: Secondary | ICD-10-CM | POA: Insufficient documentation

## 2018-02-17 ENCOUNTER — Encounter (HOSPITAL_COMMUNITY): Payer: Self-pay | Admitting: Emergency Medicine

## 2018-02-17 ENCOUNTER — Observation Stay (HOSPITAL_COMMUNITY)
Admission: EM | Admit: 2018-02-17 | Discharge: 2018-02-20 | Disposition: A | Discharge status: Home / Self Care | Payer: Medicare Other | Attending: Internal Medicine | Admitting: Internal Medicine

## 2018-02-17 ENCOUNTER — Emergency Department (HOSPITAL_COMMUNITY): Payer: Medicare Other

## 2018-02-17 ENCOUNTER — Other Ambulatory Visit: Payer: Self-pay

## 2018-02-17 DIAGNOSIS — R4182 Altered mental status, unspecified: Secondary | ICD-10-CM | POA: Diagnosis present

## 2018-02-17 DIAGNOSIS — G9341 Metabolic encephalopathy: Secondary | ICD-10-CM | POA: Diagnosis not present

## 2018-02-17 DIAGNOSIS — B9620 Unspecified Escherichia coli [E. coli] as the cause of diseases classified elsewhere: Secondary | ICD-10-CM | POA: Insufficient documentation

## 2018-02-17 DIAGNOSIS — Z933 Colostomy status: Secondary | ICD-10-CM | POA: Diagnosis not present

## 2018-02-17 DIAGNOSIS — K5900 Constipation, unspecified: Secondary | ICD-10-CM | POA: Diagnosis not present

## 2018-02-17 DIAGNOSIS — T402X5A Adverse effect of other opioids, initial encounter: Secondary | ICD-10-CM | POA: Diagnosis present

## 2018-02-17 DIAGNOSIS — K5939 Other megacolon: Secondary | ICD-10-CM | POA: Insufficient documentation

## 2018-02-17 DIAGNOSIS — R410 Disorientation, unspecified: Secondary | ICD-10-CM

## 2018-02-17 DIAGNOSIS — G8929 Other chronic pain: Secondary | ICD-10-CM | POA: Diagnosis not present

## 2018-02-17 DIAGNOSIS — Z79899 Other long term (current) drug therapy: Secondary | ICD-10-CM | POA: Insufficient documentation

## 2018-02-17 DIAGNOSIS — K5903 Drug induced constipation: Secondary | ICD-10-CM | POA: Diagnosis present

## 2018-02-17 DIAGNOSIS — N390 Urinary tract infection, site not specified: Principal | ICD-10-CM | POA: Diagnosis present

## 2018-02-17 DIAGNOSIS — T83511A Infection and inflammatory reaction due to indwelling urethral catheter, initial encounter: Secondary | ICD-10-CM

## 2018-02-17 DIAGNOSIS — K592 Neurogenic bowel, not elsewhere classified: Secondary | ICD-10-CM | POA: Diagnosis not present

## 2018-02-17 DIAGNOSIS — N319 Neuromuscular dysfunction of bladder, unspecified: Secondary | ICD-10-CM | POA: Insufficient documentation

## 2018-02-17 DIAGNOSIS — I10 Essential (primary) hypertension: Secondary | ICD-10-CM | POA: Diagnosis present

## 2018-02-17 DIAGNOSIS — L89154 Pressure ulcer of sacral region, stage 4: Secondary | ICD-10-CM | POA: Diagnosis not present

## 2018-02-17 DIAGNOSIS — Z79891 Long term (current) use of opiate analgesic: Secondary | ICD-10-CM | POA: Diagnosis not present

## 2018-02-17 DIAGNOSIS — M868X8 Other osteomyelitis, other site: Secondary | ICD-10-CM | POA: Insufficient documentation

## 2018-02-17 DIAGNOSIS — K219 Gastro-esophageal reflux disease without esophagitis: Secondary | ICD-10-CM | POA: Insufficient documentation

## 2018-02-17 DIAGNOSIS — D638 Anemia in other chronic diseases classified elsewhere: Secondary | ICD-10-CM | POA: Insufficient documentation

## 2018-02-17 DIAGNOSIS — G8220 Paraplegia, unspecified: Secondary | ICD-10-CM | POA: Diagnosis present

## 2018-02-17 DIAGNOSIS — M4628 Osteomyelitis of vertebra, sacral and sacrococcygeal region: Secondary | ICD-10-CM | POA: Diagnosis present

## 2018-02-17 DIAGNOSIS — D649 Anemia, unspecified: Secondary | ICD-10-CM | POA: Diagnosis present

## 2018-02-17 LAB — CBC WITH DIFFERENTIAL/PLATELET
BASOS PCT: 0 %
Basophils Absolute: 0 10*3/uL (ref 0.0–0.1)
EOS ABS: 0.1 10*3/uL (ref 0.0–0.7)
EOS PCT: 1 %
HCT: 33.6 % — ABNORMAL LOW (ref 39.0–52.0)
Hemoglobin: 10.5 g/dL — ABNORMAL LOW (ref 13.0–17.0)
Lymphocytes Relative: 15 %
Lymphs Abs: 1.3 10*3/uL (ref 0.7–4.0)
MCH: 26.7 pg (ref 26.0–34.0)
MCHC: 31.3 g/dL (ref 30.0–36.0)
MCV: 85.5 fL (ref 78.0–100.0)
MONO ABS: 0.4 10*3/uL (ref 0.1–1.0)
MONOS PCT: 4 %
Neutro Abs: 7.1 10*3/uL (ref 1.7–7.7)
Neutrophils Relative %: 80 %
Platelets: 276 10*3/uL (ref 150–400)
RBC: 3.93 MIL/uL — ABNORMAL LOW (ref 4.22–5.81)
RDW: 15.4 % (ref 11.5–15.5)
WBC: 8.8 10*3/uL (ref 4.0–10.5)

## 2018-02-17 LAB — LIPASE, BLOOD: Lipase: 46 U/L (ref 11–51)

## 2018-02-17 LAB — COMPREHENSIVE METABOLIC PANEL
ALBUMIN: 3 g/dL — AB (ref 3.5–5.0)
ALK PHOS: 70 U/L (ref 38–126)
ALT: 18 U/L (ref 17–63)
AST: 20 U/L (ref 15–41)
Anion gap: 12 (ref 5–15)
BUN: 14 mg/dL (ref 6–20)
CALCIUM: 8.5 mg/dL — AB (ref 8.9–10.3)
CO2: 23 mmol/L (ref 22–32)
CREATININE: 0.75 mg/dL (ref 0.61–1.24)
Chloride: 104 mmol/L (ref 101–111)
GFR calc Af Amer: >60 mL/min (ref >60)
Glucose, Bld: 99 mg/dL (ref 65–99)
Potassium: 3.8 mmol/L (ref 3.5–5.1)
Sodium: 139 mmol/L (ref 135–145)
TOTAL PROTEIN: 7.7 g/dL (ref 6.5–8.1)
Total Bilirubin: 0.3 mg/dL (ref 0.3–1.2)

## 2018-02-17 LAB — URINALYSIS, ROUTINE W REFLEX MICROSCOPIC
Bilirubin Urine: NEGATIVE
GLUCOSE, UA: NEGATIVE mg/dL
Ketones, ur: NEGATIVE mg/dL
NITRITE: POSITIVE — AB
PH: 5 (ref 5.0–8.0)
Protein, ur: NEGATIVE mg/dL
Specific Gravity, Urine: 1.01 (ref 1.005–1.030)

## 2018-02-17 LAB — PROCALCITONIN: Procalcitonin: <0.1 ng/mL

## 2018-02-17 LAB — RAPID URINE DRUG SCREEN, HOSP PERFORMED
AMPHETAMINES: NOT DETECTED
BARBITURATES: NOT DETECTED
BENZODIAZEPINES: NOT DETECTED
Cocaine: NOT DETECTED
Opiates: POSITIVE — AB
Tetrahydrocannabinol: NOT DETECTED

## 2018-02-17 LAB — ETHANOL

## 2018-02-17 MED ORDER — SODIUM CHLORIDE 0.9 % IV SOLN
1.0000 g | Freq: Once | INTRAVENOUS | Status: AC
  Administered 2018-02-17: 1 g via INTRAVENOUS
  Filled 2018-02-17: qty 10

## 2018-02-17 MED ORDER — ACETAMINOPHEN 650 MG RE SUPP: 650.0000 mg | Freq: Four times a day (QID) | RECTAL | Status: DC | PRN

## 2018-02-17 MED ORDER — OXYCODONE HCL 5 MG PO TABS
15.0000 mg | ORAL_TABLET | Freq: Four times a day (QID) | ORAL | Status: DC | PRN
  Administered 2018-02-17 – 2018-02-20 (×11): 15 mg via ORAL
  Filled 2018-02-17 (×11): qty 3

## 2018-02-17 MED ORDER — MORPHINE SULFATE (PF) 4 MG/ML IV SOLN
4.0000 mg | Freq: Once | INTRAVENOUS | Status: AC
  Administered 2018-02-17: 4 mg via INTRAVENOUS
  Filled 2018-02-17: qty 1

## 2018-02-17 MED ORDER — LISINOPRIL 20 MG PO TABS
20.0000 mg | ORAL_TABLET | Freq: Every day | ORAL | Status: DC
  Administered 2018-02-17 – 2018-02-20 (×4): 20 mg via ORAL
  Filled 2018-02-17 (×4): qty 1

## 2018-02-17 MED ORDER — ENOXAPARIN SODIUM 40 MG/0.4ML ~~LOC~~ SOLN
40.0000 mg | SUBCUTANEOUS | Status: DC
  Administered 2018-02-17 – 2018-02-19 (×3): 40 mg via SUBCUTANEOUS
  Filled 2018-02-17 (×3): qty 0.4

## 2018-02-17 MED ORDER — ONDANSETRON HCL 4 MG/2ML IJ SOLN: 4.0000 mg | Freq: Four times a day (QID) | INTRAMUSCULAR | Status: DC | PRN

## 2018-02-17 MED ORDER — IOPAMIDOL (ISOVUE-300) INJECTION 61%
INTRAVENOUS | Status: AC
  Filled 2018-02-17: qty 100

## 2018-02-17 MED ORDER — FAMOTIDINE 20 MG PO TABS
20.0000 mg | ORAL_TABLET | Freq: Two times a day (BID) | ORAL | Status: DC
  Administered 2018-02-17 – 2018-02-20 (×6): 20 mg via ORAL
  Filled 2018-02-17 (×6): qty 1

## 2018-02-17 MED ORDER — SODIUM CHLORIDE 0.9 % IV SOLN
2.0000 g | INTRAVENOUS | Status: DC
  Administered 2018-02-17 – 2018-02-19 (×3): 2 g via INTRAVENOUS
  Filled 2018-02-17 (×3): qty 2

## 2018-02-17 MED ORDER — POLYETHYLENE GLYCOL 3350 17 G PO PACK
17.0000 g | PACK | Freq: Two times a day (BID) | ORAL | Status: DC
  Administered 2018-02-17 – 2018-02-20 (×6): 17 g via ORAL
  Filled 2018-02-17 (×6): qty 1

## 2018-02-17 MED ORDER — LORAZEPAM 2 MG/ML IJ SOLN
INTRAMUSCULAR | Status: AC
  Filled 2018-02-17: qty 1

## 2018-02-17 MED ORDER — SODIUM CHLORIDE 0.9 % IV BOLUS
1000.0000 mL | Freq: Once | INTRAVENOUS | Status: AC
  Administered 2018-02-17: 1000 mL via INTRAVENOUS

## 2018-02-17 MED ORDER — LORAZEPAM 2 MG/ML IJ SOLN
2.0000 mg | Freq: Once | INTRAMUSCULAR | Status: AC
  Administered 2018-02-17: 2 mg via INTRAMUSCULAR

## 2018-02-17 MED ORDER — SODIUM CHLORIDE 0.9 % IV SOLN
INTRAVENOUS | Status: DC
  Administered 2018-02-17: 18:00:00 via INTRAVENOUS

## 2018-02-17 MED ORDER — ACETAMINOPHEN 325 MG PO TABS
650.0000 mg | ORAL_TABLET | Freq: Four times a day (QID) | ORAL | Status: DC | PRN
  Administered 2018-02-18: 650 mg via ORAL
  Filled 2018-02-17: qty 2

## 2018-02-17 MED ORDER — MAGNESIUM CITRATE PO SOLN: 1.0000 | Freq: Once | ORAL | Status: DC | PRN

## 2018-02-17 MED ORDER — ONDANSETRON HCL 4 MG PO TABS: 4.0000 mg | ORAL_TABLET | Freq: Four times a day (QID) | ORAL | Status: DC | PRN

## 2018-02-17 MED ORDER — IOPAMIDOL (ISOVUE-300) INJECTION 61%
100.0000 mL | Freq: Once | INTRAVENOUS | Status: AC | PRN
  Administered 2018-02-17: 100 mL via INTRAVENOUS

## 2018-02-17 NOTE — Progress Notes (Signed)
Patient admitted with several unstageable decubitus ulcers over the buttocks/sacrum unable to measure, slight drainage with odor; cleansed, place on air mattress and wound nurse consulted. Will continue to assess patient.

## 2018-02-17 NOTE — Progress Notes (Signed)
Patient's BP elevated in the 170s even after scheduled BP medication is given, Dr. Herbert Moors notified and stated to continue to monitor patient, reported off to night shift nurse to F/U with plan of care.

## 2018-02-17 NOTE — ED Triage Notes (Signed)
Pt BIB EMS due to patient taking his suboxone and feeling different. Patient currently yelling out and responding to internal stimuli.

## 2018-02-17 NOTE — ED Notes (Signed)
Will hold for blood and IV until pt calms down. Dr Vanita Panda aware of delay

## 2018-02-17 NOTE — ED Notes (Signed)
Vitals will be remeasured upon pt's rtn from MRI. [employer]
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
## 2018-02-17 NOTE — ED Notes (Signed)
Bed: NV91 Expected date:  Expected time:  Means of arrival:  Comments: 52 yo M/feeling different after taking Suboxone

## 2018-02-17 NOTE — ED Notes (Signed)
Patient transported to CT 

## 2018-02-17 NOTE — ED Notes (Signed)
Called main lab to collect labs. Pt is a difficult stick and [place] was necessary to obtain IV
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
## 2018-02-17 NOTE — ED Notes (Addendum)
Pt yelling in room, not making any sense with his comments. Pt has face covered with sheet. Pt will follow commands and is redirectable. Pt is having a conversation with no one is in the room.

## 2018-02-17 NOTE — ED Notes (Signed)
Pt had urine on gown and bed. Pt sts that he self caths but leaks. Pt was cleaned, linen changed. Pt reports that he self caths and needs to urinate. Obtained order for I&O cath. Pt returned approx 600 cc dark, foul-smelling urine. Pt also has multiple decubiti. He reports that this is chronic d/t being in wheelchair. Dr Vanita Panda notified and sts he will order would care

## 2018-02-18 DIAGNOSIS — I10 Essential (primary) hypertension: Secondary | ICD-10-CM

## 2018-02-18 DIAGNOSIS — G8220 Paraplegia, unspecified: Secondary | ICD-10-CM

## 2018-02-18 DIAGNOSIS — T83511D Infection and inflammatory reaction due to indwelling urethral catheter, subsequent encounter: Secondary | ICD-10-CM

## 2018-02-18 DIAGNOSIS — Z933 Colostomy status: Secondary | ICD-10-CM | POA: Diagnosis not present

## 2018-02-18 DIAGNOSIS — R4182 Altered mental status, unspecified: Secondary | ICD-10-CM

## 2018-02-18 DIAGNOSIS — N390 Urinary tract infection, site not specified: Secondary | ICD-10-CM | POA: Diagnosis not present

## 2018-02-18 DIAGNOSIS — K5903 Drug induced constipation: Secondary | ICD-10-CM | POA: Diagnosis not present

## 2018-02-18 LAB — BASIC METABOLIC PANEL
BUN: 12 mg/dL (ref 6–20)
CO2: 25 mmol/L (ref 22–32)
Chloride: 109 mmol/L (ref 101–111)
Glucose, Bld: 91 mg/dL (ref 65–99)
Potassium: 3.6 mmol/L (ref 3.5–5.1)

## 2018-02-18 LAB — BASIC METABOLIC PANEL WITH GFR
Anion gap: 9 (ref 5–15)
Calcium: 8.7 mg/dL — ABNORMAL LOW (ref 8.9–10.3)
Creatinine, Ser: 0.71 mg/dL (ref 0.61–1.24)
GFR calc Af Amer: >60 mL/min (ref >60)
GFR calc non Af Amer: >60 mL/min (ref >60)
Sodium: 143 mmol/L (ref 135–145)

## 2018-02-18 LAB — CBC
HCT: 32.5 % — ABNORMAL LOW (ref 39.0–52.0)
Hemoglobin: 10 g/dL — ABNORMAL LOW (ref 13.0–17.0)
MCH: 26 pg (ref 26.0–34.0)
MCHC: 30.8 g/dL (ref 30.0–36.0)
MCV: 84.6 fL (ref 78.0–100.0)
Platelets: 275 K/uL (ref 150–400)
RBC: 3.84 MIL/uL — ABNORMAL LOW (ref 4.22–5.81)
RDW: 15.4 % (ref 11.5–15.5)
WBC: 5.9 K/uL (ref 4.0–10.5)

## 2018-02-18 LAB — PROCALCITONIN: Procalcitonin: 0.11 ng/mL

## 2018-02-18 LAB — HIV ANTIBODY (ROUTINE TESTING W REFLEX): HIV Screen 4th Generation wRfx: NONREACTIVE

## 2018-02-18 MED ORDER — HYDRALAZINE HCL 20 MG/ML IJ SOLN
10.0000 mg | Freq: Three times a day (TID) | INTRAMUSCULAR | Status: DC | PRN
  Filled 2018-02-18: qty 1

## 2018-02-18 MED ORDER — SENNOSIDES-DOCUSATE SODIUM 8.6-50 MG PO TABS
1.0000 | ORAL_TABLET | Freq: Two times a day (BID) | ORAL | Status: DC
  Administered 2018-02-18 – 2018-02-20 (×5): 1 via ORAL
  Filled 2018-02-18 (×5): qty 1
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
## 2018-02-18 NOTE — Progress Notes (Signed)
Unable to complete admission history due to patient's mental status. No family present.

## 2018-02-18 NOTE — Plan of Care (Signed)
  Problem: Education: Goal: Knowledge of General Education information will improve Outcome: Progressing   Problem: Clinical Measurements: Goal: Ability to maintain clinical measurements within normal limits will improve Outcome: Progressing Goal: Will remain free from infection Outcome: Progressing Goal: Diagnostic test results will improve Outcome: Progressing Goal: Respiratory complications will improve Outcome: Progressing Goal: Cardiovascular complication will be avoided Outcome: Progressing   Problem: Safety: Goal: Ability to remain free from injury will improve Outcome: Progressing

## 2018-02-18 NOTE — Care Management CC44 (Signed)
Condition Code 44 Documentation Completed  Patient Details  Name: [name] MRN: [id number] Date of Birth: 08/16/1966   Condition Code 44 given:  Yes Patient signature on Condition Code 44 notice:  Yes Documentation of 2 MD's agreement:  Yes Code 44 added to claim:  Yes    Dessa Phi, RN 02/18/2018, 4:10 PM

## 2018-02-18 NOTE — Consult Note (Addendum)
[place] Nurse wound consult note Reason for Consult:Chronic unstageable pressure injuries to bilateral ischial tuberosities and sacrum.  Scarring from healed wounds present.  LLQ diverting colostomy present.  Wound type: unstageable pressure injury Pressure Injury POA: Yes Measurement: Sacrum:  0.7 cm tunnel with undermining.  Pain with assessment prevents thorough exploration.  Right ischium:  3 cm x 3 cm with narrow tunnel in center, extends 2.2 cm  Left ischium:  0.5 cm tunnel, extends 2 cm, difficulty with assessment due to pain.  Wound AVW:PVXYIAX to visualize.  Right ishium with 80 % white devitalized tissue.  Drainage (amount, consistency, odor) minimal serosanguinous  Musty odor Periwound: Dressing procedure/placement/frequency: Cleanse wounds to left and right ischium and sacrum with NS.  Gently fill wound depth with Iodoform packing strip.  Cover with ABD pad and tape.  Change daily.   Will not follow at this time.  Please re-consult if needed.  [place] Nurse ostomy consult note Stoma type/location: LLQ colostomy pouch intact  No needs at this time [name] RN [place] Pager [phone]

## 2018-02-18 NOTE — Care Management Obs Status (Signed)
[place] NOTIFICATION   Patient Details  Name: [name] MRN: [id number] Date of Birth: 1966/08/13   Medicare Observation Status Notification Given:  Yes    MahabirJuliann Pulse, RN 02/18/2018, 4:10 PM

## 2018-02-18 NOTE — Care Management Note (Signed)
Case Management Note  Patient Details  Name: [name] MRN: [id number] Date of Birth: [birth year]  Subjective/Objective:  53 y/o m admitted w/AMS. From home. Hx: paraplegic. Active w/[facility]-rep Santiago Glad aware & following.                  Action/Plan:d/c home w/HHC.   Expected Discharge Date:  (unknown)               Expected Discharge Plan:  [place]  In-House Referral:     Discharge planning Services  CM Consult  Post Acute Care Choice:  Home Health(Active w/[facility]) Choice offered to:  Patient  DME Arranged:    DME Agency:     HH Arranged:  RN [place] Agency:  [facility]  Status of Service:  In process, will continue to follow  If discussed at Long Length of Stay Meetings, dates discussed:    Additional Comments:  Dessa Phi, RN 02/18/2018, 11:13 AM

## 2018-02-19 ENCOUNTER — Observation Stay (HOSPITAL_COMMUNITY): Payer: Medicare Other

## 2018-02-19 DIAGNOSIS — K5903 Drug induced constipation: Secondary | ICD-10-CM

## 2018-02-19 DIAGNOSIS — Z933 Colostomy status: Secondary | ICD-10-CM | POA: Diagnosis not present

## 2018-02-19 DIAGNOSIS — N390 Urinary tract infection, site not specified: Secondary | ICD-10-CM | POA: Diagnosis not present

## 2018-02-19 DIAGNOSIS — R4182 Altered mental status, unspecified: Secondary | ICD-10-CM | POA: Diagnosis not present

## 2018-02-19 LAB — CBC WITH DIFFERENTIAL/PLATELET
Basophils Absolute: 0 10*3/uL (ref 0.0–0.1)
Basophils Relative: 0 %
Eosinophils Absolute: 0.1 10*3/uL (ref 0.0–0.7)
Eosinophils Relative: 2 %
HEMATOCRIT: 31 % — AB (ref 39.0–52.0)
Hemoglobin: 9.6 g/dL — ABNORMAL LOW (ref 13.0–17.0)
LYMPHS ABS: 2.6 10*3/uL (ref 0.7–4.0)
Lymphocytes Relative: 44 %
MCH: 26.2 pg (ref 26.0–34.0)
MCHC: 31 g/dL (ref 30.0–36.0)
MCV: 84.5 fL (ref 78.0–100.0)
MONO ABS: 0.3 10*3/uL (ref 0.1–1.0)
MONOS PCT: 5 %
NEUTROS PCT: 49 %
Neutro Abs: 2.9 10*3/uL (ref 1.7–7.7)
Platelets: 267 10*3/uL (ref 150–400)
RBC: 3.67 MIL/uL — ABNORMAL LOW (ref 4.22–5.81)
RDW: 15.4 % (ref 11.5–15.5)
WBC: 5.9 10*3/uL (ref 4.0–10.5)

## 2018-02-19 LAB — BASIC METABOLIC PANEL
ANION GAP: 10 (ref 5–15)
BUN: 10 mg/dL (ref 6–20)
CHLORIDE: 107 mmol/L (ref 101–111)
CO2: 25 mmol/L (ref 22–32)
Calcium: 8.4 mg/dL — ABNORMAL LOW (ref 8.9–10.3)
Creatinine, Ser: 0.73 mg/dL (ref 0.61–1.24)
GFR calc Af Amer: >60 mL/min (ref >60)
GLUCOSE: 93 mg/dL (ref 65–99)
POTASSIUM: 3.6 mmol/L (ref 3.5–5.1)
Sodium: 142 mmol/L (ref 135–145)

## 2018-02-19 LAB — PROCALCITONIN: Procalcitonin: <0.1 ng/mL

## 2018-02-19 MED ORDER — AMLODIPINE BESYLATE 5 MG PO TABS
5.0000 mg | ORAL_TABLET | Freq: Every day | ORAL | Status: DC
  Administered 2018-02-19 – 2018-02-20 (×2): 5 mg via ORAL
  Filled 2018-02-19 (×2): qty 1

## 2018-02-19 NOTE — Plan of Care (Signed)
  Problem: Coping: Goal: Level of anxiety will decrease Outcome: Progressing   Problem: Elimination: Goal: Will not experience complications related to bowel motility Outcome: Progressing Goal: Will not experience complications related to urinary retention Outcome: Progressing   Problem: Pain Managment: Goal: General experience of comfort will improve Outcome: Progressing   Problem: Safety: Goal: Ability to remain free from injury will improve Outcome: Progressing   Problem: Urinary Elimination: Goal: Signs and symptoms of infection will decrease Outcome: Progressing

## 2018-02-19 NOTE — Progress Notes (Signed)
PROGRESS NOTE  [name] SWN:[id number] DOB: June 11, 1966 DOA: 02/17/2018 PCP: Patient, No Pcp Per  HPI/Recap of past 24 hours: [name] is a 52 year old male with medical history significant for polysubstance abuse, chronic stage IV decubitus ulcer, neurogenic bladder with intermittent self-catheterization, hypertension, chronic opiate use complicated by severe constipation requiring colostomy presents to the ED due to altered mental status.  Patient reported taking likely illicit Suboxone (states that somebody gave it to him).  In the ED, patient was noted to be lethargic, complained of his chronic generalized pain and denied any other acute symptoms.  UA was nitrite positive.  UDS positive for opiates.  CT head showed possible hypodensity in the right cerebellar hemisphere but more likely to be an artifact.  CT abdomen pelvis with no acute changes.  Patient admitted for further management  Today, patient reported significantly reduced stool in his colostomy bag, which is kind of unusual.  Known history of severe constipation.  Denies any significant abdominal pain, mildly distended, denies any nausea/vomiting, fever/chills, chest pain, shortness of breath.  Assessment/Plan: Active Problems:   Paraplegia (HCC)   Anemia   Hypertension   Osteomyelitis with necrosis of sacrum    Colostomy in place for fecal diversion   Urinary tract infection associated with indwelling urethral catheter (HCC)   Constipation   Altered mental status   Acute lower UTI  UTI with history of neurogenic bladder Afebrile, no leukocytosis UA showed positive nitrites, small leukocytes, 21-50 WBC UC showed 20,000 colonies of E. Coli BC x2 no growth to date Continue IV ceftriaxone, de-escalate or discontinue upon discharge Continue indwelling foley, discontinue upon discharge to continue in and out cath done by patient  Acute metabolic encephalopathy Resolved Likely due to polysubstance abuse, with  recent ?? Illicit suboxone use Vs UTI Vs infected sacral decubitus ulcers UDS positive for opiates-takes oxycodone at home CT head showed possible hypodensity in the right cerebellar hemisphere but more likely to be an artifact Monitor closely  Paraplegia complicated by neurogenic colon Patient reporting significant reduced stool in colostomy bag Status post diverting colostomy CT abdomen with no acute changes Abdominal x-ray pending Continue twice daily MiraLAX and Senokot, may use mag citrate if abdominal x-ray shows severe constipation  Hypertension Currently uncontrolled Continue home lisinopril, added amlodipine 5 mg daily PRN hydralazine  Anemia of chronic disease Currently at baseline Daily CBC  Stage IV decubitus sacral ulcers Multiple wounds noted in the sacral region Wound care on board Wound dressing PRN  Chronic pain Continue p.o. Narcotics Stool softeners  Crying spells, ??low mood Resolved Overwhelmed by overall medical condition May consult psych if persistent    Code Status: Full  Family Communication: None at bedside  Disposition Plan: Home likely 02/20/2018   Consultants:  None  Procedures:  None  Antimicrobials:  IV Rocephin  DVT prophylaxis: Lovenox   Objective: Vitals:   02/18/18 2044 02/19/18 0433 02/19/18 0935 02/19/18 1330  BP: (!) 162/70 (!) 166/81 (!) 165/80 (!) 172/76  Pulse: (!) 58 (!) 58  60  Resp: 20 14  16   Temp: 98.8 F (37.1 C) 98.3 F (36.8 C)  98.1 F (36.7 C)  TempSrc:    Axillary  SpO2: 99% 100%  100%  Weight:      Height:        Intake/Output Summary (Last 24 hours) at 02/19/2018 1459 Last data filed at 02/19/2018 1448 Gross per 24 hour  Intake 460 ml  Output 800 ml  Net -340 ml   [employer]  02/18/18 1132  Weight: 96.5 kg (212 lb 12.8 oz)    Exam:   General: NAD  Cardiovascular: S1-S2 present  Respiratory: Diminished BS bilaterally  Abdomen: Soft, nondistended, non-tender, BS  present, colostomy bag noted with very minimal stool  Musculoskeletal: No pedal edema bilaterally, contracted/atrophic  Skin: Multiple ulcers in the decubitus area  Psychiatry: Normal mood   Data Reviewed: CBC: Recent Labs  Lab 02/17/18 0850 02/18/18 0518 02/19/18 0453  WBC 8.8 5.9 5.9  NEUTROABS 7.1  --  2.9  HGB 10.5* 10.0* 9.6*  HCT 33.6* 32.5* 31.0*  MCV 85.5 84.6 84.5  PLT 276 275 182   Basic Metabolic Panel: Recent Labs  Lab 02/17/18 0850 02/18/18 0518 02/19/18 0453  NA 139 143 142  K 3.8 3.6 3.6  CL 104 109 107  CO2 23 25 25   GLUCOSE 99 91 93  BUN 14 12 10   CREATININE 0.75 0.71 0.73  CALCIUM 8.5* 8.7* 8.4*   GFR: Estimated Creatinine Clearance: 127.2 mL/min (by C-G formula based on SCr of 0.73 mg/dL). Liver Function Tests: Recent Labs  Lab 02/17/18 0850  AST 20  ALT 18  ALKPHOS 70  BILITOT 0.3  PROT 7.7  ALBUMIN 3.0*   Recent Labs  Lab 02/17/18 0850  LIPASE 46   No results for input(s): AMMONIA in the last 168 hours. Coagulation Profile: No results for input(s): INR, PROTIME in the last 168 hours. Cardiac Enzymes: No results for input(s): CKTOTAL, CKMB, CKMBINDEX, TROPONINI in the last 168 hours. BNP (last 3 results) No results for input(s): PROBNP in the last 8760 hours. HbA1C: No results for input(s): HGBA1C in the last 72 hours. CBG: No results for input(s): GLUCAP in the last 168 hours. Lipid Profile: No results for input(s): CHOL, HDL, LDLCALC, TRIG, CHOLHDL, LDLDIRECT in the last 72 hours. Thyroid Function Tests: No results for input(s): TSH, T4TOTAL, FREET4, T3FREE, THYROIDAB in the last 72 hours. Anemia Panel: No results for input(s): VITAMINB12, FOLATE, FERRITIN, TIBC, IRON, RETICCTPCT in the last 72 hours. Urine analysis:    Component Value Date/Time   COLORURINE YELLOW 02/17/2018 0722   APPEARANCEUR HAZY (A) 02/17/2018 0722   LABSPEC 1.010 02/17/2018 0722   PHURINE 5.0 02/17/2018 0722   GLUCOSEU NEGATIVE 02/17/2018 0722    HGBUR LARGE (A) 02/17/2018 0722   BILIRUBINUR NEGATIVE 02/17/2018 0722   KETONESUR NEGATIVE 02/17/2018 0722   PROTEINUR NEGATIVE 02/17/2018 0722   UROBILINOGEN 1.0 08/06/2015 0147   NITRITE POSITIVE (A) 02/17/2018 0722   LEUKOCYTESUR SMALL (A) 02/17/2018 0722   Sepsis Labs: @LABRCNTIP (procalcitonin:4,lacticidven:4)  ) Recent Results (from the past 240 hour(s))  Culture, blood (routine x 2)     Status: None (Preliminary result)   Collection Time: 02/17/18  2:57 PM  Result Value Ref Range Status   Specimen Description   Final    BLOOD LEFT ANTECUBITAL Performed at [facility], [place] [address] [place], [place] [address]    Special Requests   Final    BOTTLES DRAWN AEROBIC ONLY Blood Culture results may not be optimal due to an inadequate volume of blood received in culture bottles Performed at [place] [address] [place], [place] [address]    Culture   Final    NO GROWTH 2 DAYS Performed at [place] [address] [place], [place] [address]    Report Status PENDING  Incomplete  Culture, blood (routine x 2)     Status: None (Preliminary result)   Collection Time: 02/17/18  2:57  PM  Result Value Ref Range Status   Specimen Description   Final    BLOOD LEFT HAND Performed at [facility], [place] [address], [place], [place] [address]    Special Requests   Final    BOTTLES DRAWN AEROBIC ONLY Blood Culture results may not be optimal due to an inadequate volume of blood received in culture bottles Performed at [place] [address], [place], [place] [address]    Culture   Final    NO GROWTH 2 DAYS Performed at [place] [address] [place], [place] [address]    Report Status PENDING  Incomplete  Culture, Urine     Status: Abnormal (Preliminary result)   Collection Time: 02/17/18  6:30 PM  Result Value Ref Range Status   Specimen Description   Final    URINE,  CATHETERIZED Performed at [facility], [place] [address] [place], [place] [address]    Special Requests   Final    NONE Performed at [facility], [place] [address], [place], [place] [address]    Culture 20,000 COLONIES/mL ESCHERICHIA COLI (A)  Final   Report Status PENDING  Incomplete      Studies: No results found.  Scheduled Meds: . amLODipine  5 mg Oral Daily  . enoxaparin (LOVENOX) injection  40 mg Subcutaneous Q24H  . famotidine  20 mg Oral BID  . lisinopril  20 mg Oral Daily  . polyethylene glycol  17 g Oral BID  . senna-docusate  1 tablet Oral BID    Continuous Infusions: . cefTRIAXone (ROCEPHIN)  IV Stopped (02/18/18 2231)     LOS: 1 day     Alma Friendly, MD [facility]   If 7PM-7AM, please contact night-coverage [email or web address] Password [facility] 02/19/2018, 2:59 PM

## 2018-02-20 DIAGNOSIS — R4182 Altered mental status, unspecified: Secondary | ICD-10-CM | POA: Diagnosis not present

## 2018-02-20 DIAGNOSIS — N390 Urinary tract infection, site not specified: Secondary | ICD-10-CM | POA: Diagnosis not present

## 2018-02-20 LAB — CBC WITH DIFFERENTIAL/PLATELET
BASOS ABS: 0 10*3/uL (ref 0.0–0.1)
BASOS PCT: 1 %
EOS ABS: 0.1 10*3/uL (ref 0.0–0.7)
EOS PCT: 2 %
HEMATOCRIT: 33.5 % — AB (ref 39.0–52.0)
Hemoglobin: 10.1 g/dL — ABNORMAL LOW (ref 13.0–17.0)
Lymphocytes Relative: 46 %
Lymphs Abs: 2.5 10*3/uL (ref 0.7–4.0)
MCH: 25.3 pg — ABNORMAL LOW (ref 26.0–34.0)
MCHC: 30.1 g/dL (ref 30.0–36.0)
MCV: 84 fL (ref 78.0–100.0)
MONOS PCT: 7 %
Monocytes Absolute: 0.4 10*3/uL (ref 0.1–1.0)
NEUTROS ABS: 2.4 10*3/uL (ref 1.7–7.7)
Neutrophils Relative %: 44 %
PLATELETS: 269 10*3/uL (ref 150–400)
RBC: 3.99 MIL/uL — ABNORMAL LOW (ref 4.22–5.81)
RDW: 15.3 % (ref 11.5–15.5)
WBC: 5.4 10*3/uL (ref 4.0–10.5)

## 2018-02-20 LAB — BASIC METABOLIC PANEL
ANION GAP: 10 (ref 5–15)
BUN: 10 mg/dL (ref 6–20)
CO2: 24 mmol/L (ref 22–32)
Calcium: 8.3 mg/dL — ABNORMAL LOW (ref 8.9–10.3)
Chloride: 106 mmol/L (ref 101–111)
Creatinine, Ser: 0.7 mg/dL (ref 0.61–1.24)
Glucose, Bld: 85 mg/dL (ref 65–99)
Potassium: 4 mmol/L (ref 3.5–5.1)
Sodium: 140 mmol/L (ref 135–145)

## 2018-02-20 LAB — URINE CULTURE: Culture: 20000 — AB

## 2018-02-20 LAB — MRSA PCR SCREENING: MRSA by PCR: NEGATIVE

## 2018-02-20 MED ORDER — SULFAMETHOXAZOLE-TRIMETHOPRIM 800-160 MG PO TABS: 1.0000 | ORAL_TABLET | Freq: Two times a day (BID) | ORAL | 0 refills | Status: AC

## 2018-02-20 MED ORDER — SULFAMETHOXAZOLE-TRIMETHOPRIM 800-160 MG PO TABS
1.0000 | ORAL_TABLET | Freq: Two times a day (BID) | ORAL | Status: DC
  Administered 2018-02-20: 1 via ORAL
  Filled 2018-02-20: qty 1

## 2018-02-20 MED ORDER — AMLODIPINE BESYLATE 5 MG PO TABS: 5.0000 mg | ORAL_TABLET | Freq: Every day | ORAL | 0 refills | Status: DC

## 2018-02-20 NOTE — Care Management Note (Signed)
Case Management Note  Patient Details  Name: [name] MRN: [id number] Date of Birth: 05-Apr-1966  Subjective/Objective: [name] aware of d/c today w/HHRN-f/c mgmnt,& sacral decub dsg change, colostomy mgmnt. PTAR forms on shadow chart for Nsg to call when ready. No further CM needs.                  Action/Plan:d/c home w/HHC/PTAR   Expected Discharge Date:  (unknown)               Expected Discharge Plan:  [place]  In-House Referral:     Discharge planning Services  CM Consult  Post Acute Care Choice:  Home Health(Active w/[facility]) Choice offered to:  Patient  DME Arranged:    DME Agency:     HH Arranged:  RN [place] Agency:  [place]  Status of Service:  Completed, signed off  If discussed at [place] of Stay Meetings, dates discussed:    Additional Comments:  Dessa Phi, RN 02/20/2018, 10:59 AM
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
## 2018-02-22 LAB — CULTURE, BLOOD (ROUTINE X 2)
Culture: NO GROWTH
Culture: NO GROWTH

## 2018-03-06 ENCOUNTER — Encounter (HOSPITAL_BASED_OUTPATIENT_CLINIC_OR_DEPARTMENT_OTHER): Payer: Medicare Other | Attending: Internal Medicine

## 2018-03-06 DIAGNOSIS — G8222 Paraplegia, incomplete: Secondary | ICD-10-CM | POA: Diagnosis not present

## 2018-03-06 DIAGNOSIS — L89324 Pressure ulcer of left buttock, stage 4: Secondary | ICD-10-CM | POA: Diagnosis not present

## 2018-03-06 DIAGNOSIS — F17210 Nicotine dependence, cigarettes, uncomplicated: Secondary | ICD-10-CM | POA: Diagnosis not present

## 2018-03-06 DIAGNOSIS — L89154 Pressure ulcer of sacral region, stage 4: Secondary | ICD-10-CM | POA: Insufficient documentation

## 2018-03-06 DIAGNOSIS — L89314 Pressure ulcer of right buttock, stage 4: Secondary | ICD-10-CM | POA: Diagnosis not present

## 2018-04-03 ENCOUNTER — Encounter (HOSPITAL_BASED_OUTPATIENT_CLINIC_OR_DEPARTMENT_OTHER): Payer: Medicare Other | Attending: Physician Assistant

## 2018-04-03 DIAGNOSIS — L89154 Pressure ulcer of sacral region, stage 4: Secondary | ICD-10-CM | POA: Diagnosis not present

## 2018-04-03 DIAGNOSIS — G8222 Paraplegia, incomplete: Secondary | ICD-10-CM | POA: Insufficient documentation

## 2018-04-03 DIAGNOSIS — F17210 Nicotine dependence, cigarettes, uncomplicated: Secondary | ICD-10-CM | POA: Diagnosis not present

## 2018-04-03 DIAGNOSIS — L89324 Pressure ulcer of left buttock, stage 4: Secondary | ICD-10-CM | POA: Diagnosis not present

## 2018-04-03 DIAGNOSIS — L89314 Pressure ulcer of right buttock, stage 4: Secondary | ICD-10-CM | POA: Diagnosis not present

## 2018-05-01 DIAGNOSIS — L89324 Pressure ulcer of left buttock, stage 4: Secondary | ICD-10-CM | POA: Diagnosis not present

## 2018-05-24 ENCOUNTER — Emergency Department (HOSPITAL_COMMUNITY): Payer: Medicare Other

## 2018-05-24 ENCOUNTER — Other Ambulatory Visit: Payer: Self-pay

## 2018-05-24 ENCOUNTER — Emergency Department (HOSPITAL_COMMUNITY)
Admission: EM | Admit: 2018-05-24 | Discharge: 2018-05-24 | Disposition: A | Discharge status: Home / Self Care | Payer: Medicare Other | Attending: Emergency Medicine | Admitting: Emergency Medicine

## 2018-05-24 DIAGNOSIS — F17210 Nicotine dependence, cigarettes, uncomplicated: Secondary | ICD-10-CM | POA: Diagnosis not present

## 2018-05-24 DIAGNOSIS — R51 Headache: Secondary | ICD-10-CM | POA: Diagnosis present

## 2018-05-24 DIAGNOSIS — R519 Headache, unspecified: Secondary | ICD-10-CM

## 2018-05-24 DIAGNOSIS — Z79899 Other long term (current) drug therapy: Secondary | ICD-10-CM | POA: Insufficient documentation

## 2018-05-24 DIAGNOSIS — I10 Essential (primary) hypertension: Secondary | ICD-10-CM | POA: Insufficient documentation

## 2018-05-24 LAB — CBG MONITORING, ED: GLUCOSE-CAPILLARY: 94 mg/dL (ref 70–99)

## 2018-05-24 MED ORDER — KETOROLAC TROMETHAMINE 60 MG/2ML IM SOLN
60.0000 mg | Freq: Once | INTRAMUSCULAR | Status: AC
  Administered 2018-05-24: 60 mg via INTRAMUSCULAR
  Filled 2018-05-24: qty 2

## 2018-05-24 MED ORDER — PROCHLORPERAZINE EDISYLATE 10 MG/2ML IJ SOLN
10.0000 mg | Freq: Once | INTRAMUSCULAR | Status: AC
  Administered 2018-05-24: 10 mg via INTRAMUSCULAR
  Filled 2018-05-24: qty 2

## 2018-05-24 NOTE — ED Notes (Signed)
Bed: WA09 Expected date:  Expected time:  Means of arrival:  Comments: EMS-headache

## 2018-05-24 NOTE — ED Notes (Signed)
[facility] called for patient transport home. Patient is paraplegic.

## 2018-05-24 NOTE — ED Notes (Signed)
Patient transported to CT 

## 2018-05-24 NOTE — ED Notes (Signed)
Bed: WHALB Expected date:  Expected time:  Means of arrival:  Comments: 

## 2018-05-24 NOTE — ED Triage Notes (Signed)
Patient came from home by EMS. Pt c/o of headache on both sides of his temporal per EMS. Pt has ostomy bag. Pt is paraplegic.Hx of hypertension. BP 150/78 , HR 74, CBG 103 per EMS.
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
## 2018-05-29 ENCOUNTER — Ambulatory Visit (HOSPITAL_BASED_OUTPATIENT_CLINIC_OR_DEPARTMENT_OTHER): Payer: Medicaid Other

## 2018-06-05 ENCOUNTER — Encounter (HOSPITAL_BASED_OUTPATIENT_CLINIC_OR_DEPARTMENT_OTHER): Payer: Medicare Other | Attending: Physician Assistant

## 2018-06-05 DIAGNOSIS — L89314 Pressure ulcer of right buttock, stage 4: Secondary | ICD-10-CM | POA: Insufficient documentation

## 2018-06-05 DIAGNOSIS — F17210 Nicotine dependence, cigarettes, uncomplicated: Secondary | ICD-10-CM | POA: Insufficient documentation

## 2018-06-05 DIAGNOSIS — L89154 Pressure ulcer of sacral region, stage 4: Secondary | ICD-10-CM | POA: Diagnosis not present

## 2018-06-05 DIAGNOSIS — G8222 Paraplegia, incomplete: Secondary | ICD-10-CM | POA: Insufficient documentation

## 2018-06-05 DIAGNOSIS — L89322 Pressure ulcer of left buttock, stage 2: Secondary | ICD-10-CM | POA: Insufficient documentation

## 2018-06-05 DIAGNOSIS — I10 Essential (primary) hypertension: Secondary | ICD-10-CM | POA: Insufficient documentation

## 2018-07-03 ENCOUNTER — Encounter (HOSPITAL_BASED_OUTPATIENT_CLINIC_OR_DEPARTMENT_OTHER): Payer: Self-pay

## 2018-07-03 ENCOUNTER — Encounter (HOSPITAL_BASED_OUTPATIENT_CLINIC_OR_DEPARTMENT_OTHER): Payer: Medicare Other | Attending: Physician Assistant

## 2018-07-03 DIAGNOSIS — I10 Essential (primary) hypertension: Secondary | ICD-10-CM | POA: Insufficient documentation

## 2018-07-03 DIAGNOSIS — L89154 Pressure ulcer of sacral region, stage 4: Secondary | ICD-10-CM | POA: Insufficient documentation

## 2018-07-03 DIAGNOSIS — L89324 Pressure ulcer of left buttock, stage 4: Secondary | ICD-10-CM | POA: Insufficient documentation

## 2018-07-03 DIAGNOSIS — G8222 Paraplegia, incomplete: Secondary | ICD-10-CM | POA: Insufficient documentation

## 2018-07-03 DIAGNOSIS — L89314 Pressure ulcer of right buttock, stage 4: Secondary | ICD-10-CM | POA: Insufficient documentation

## 2018-07-03 DIAGNOSIS — F17210 Nicotine dependence, cigarettes, uncomplicated: Secondary | ICD-10-CM | POA: Insufficient documentation

## 2018-07-17 DIAGNOSIS — G8222 Paraplegia, incomplete: Secondary | ICD-10-CM | POA: Diagnosis not present

## 2018-07-17 DIAGNOSIS — L89154 Pressure ulcer of sacral region, stage 4: Secondary | ICD-10-CM | POA: Diagnosis not present

## 2018-07-17 DIAGNOSIS — L89324 Pressure ulcer of left buttock, stage 4: Secondary | ICD-10-CM | POA: Diagnosis present

## 2018-07-17 DIAGNOSIS — L89314 Pressure ulcer of right buttock, stage 4: Secondary | ICD-10-CM | POA: Diagnosis not present

## 2018-07-17 DIAGNOSIS — F17210 Nicotine dependence, cigarettes, uncomplicated: Secondary | ICD-10-CM | POA: Diagnosis not present

## 2018-07-17 DIAGNOSIS — I10 Essential (primary) hypertension: Secondary | ICD-10-CM | POA: Diagnosis not present

## 2018-08-12 ENCOUNTER — Encounter (HOSPITAL_COMMUNITY): Payer: Self-pay

## 2018-08-12 ENCOUNTER — Emergency Department (HOSPITAL_COMMUNITY): Payer: Medicare Other

## 2018-08-12 ENCOUNTER — Observation Stay (HOSPITAL_COMMUNITY)
Admission: EM | Admit: 2018-08-12 | Discharge: 2018-08-13 | Disposition: A | Discharge status: Home / Self Care | Payer: Medicare Other | Attending: Internal Medicine | Admitting: Internal Medicine

## 2018-08-12 DIAGNOSIS — I10 Essential (primary) hypertension: Secondary | ICD-10-CM | POA: Insufficient documentation

## 2018-08-12 DIAGNOSIS — F17210 Nicotine dependence, cigarettes, uncomplicated: Secondary | ICD-10-CM | POA: Diagnosis not present

## 2018-08-12 DIAGNOSIS — R4182 Altered mental status, unspecified: Secondary | ICD-10-CM | POA: Diagnosis present

## 2018-08-12 DIAGNOSIS — K219 Gastro-esophageal reflux disease without esophagitis: Secondary | ICD-10-CM | POA: Insufficient documentation

## 2018-08-12 DIAGNOSIS — Z933 Colostomy status: Secondary | ICD-10-CM | POA: Insufficient documentation

## 2018-08-12 DIAGNOSIS — L89159 Pressure ulcer of sacral region, unspecified stage: Principal | ICD-10-CM | POA: Insufficient documentation

## 2018-08-12 DIAGNOSIS — L89309 Pressure ulcer of unspecified buttock, unspecified stage: Secondary | ICD-10-CM

## 2018-08-12 DIAGNOSIS — N179 Acute kidney failure, unspecified: Secondary | ICD-10-CM | POA: Diagnosis not present

## 2018-08-12 DIAGNOSIS — G8220 Paraplegia, unspecified: Secondary | ICD-10-CM | POA: Diagnosis present

## 2018-08-12 DIAGNOSIS — D649 Anemia, unspecified: Secondary | ICD-10-CM | POA: Diagnosis not present

## 2018-08-12 DIAGNOSIS — G9340 Encephalopathy, unspecified: Secondary | ICD-10-CM | POA: Diagnosis not present

## 2018-08-12 DIAGNOSIS — F1410 Cocaine abuse, uncomplicated: Secondary | ICD-10-CM | POA: Insufficient documentation

## 2018-08-12 DIAGNOSIS — Z79899 Other long term (current) drug therapy: Secondary | ICD-10-CM | POA: Insufficient documentation

## 2018-08-12 DIAGNOSIS — L8990 Pressure ulcer of unspecified site, unspecified stage: Secondary | ICD-10-CM | POA: Diagnosis present

## 2018-08-12 DIAGNOSIS — E875 Hyperkalemia: Secondary | ICD-10-CM | POA: Diagnosis not present

## 2018-08-12 DIAGNOSIS — R8281 Pyuria: Secondary | ICD-10-CM | POA: Insufficient documentation

## 2018-08-12 DIAGNOSIS — N3000 Acute cystitis without hematuria: Secondary | ICD-10-CM

## 2018-08-12 DIAGNOSIS — L8940 Pressure ulcer of contiguous site of back, buttock and hip, unspecified stage: Secondary | ICD-10-CM

## 2018-08-12 LAB — CBC WITH DIFFERENTIAL/PLATELET
ABS IMMATURE GRANULOCYTES: 0.04 10*3/uL (ref 0.00–0.07)
Basophils Absolute: 0.1 10*3/uL (ref 0.0–0.1)
Basophils Relative: 1 %
EOS PCT: 1 %
Eosinophils Absolute: 0.1 10*3/uL (ref 0.0–0.5)
HEMATOCRIT: 34.5 % — AB (ref 39.0–52.0)
HEMOGLOBIN: 10.3 g/dL — AB (ref 13.0–17.0)
Immature Granulocytes: 0 %
LYMPHS ABS: 1.5 10*3/uL (ref 0.7–4.0)
Lymphocytes Relative: 14 %
MCH: 26.3 pg (ref 26.0–34.0)
MCHC: 29.9 g/dL — ABNORMAL LOW (ref 30.0–36.0)
MCV: 88.2 fL (ref 80.0–100.0)
MONO ABS: 0.5 10*3/uL (ref 0.1–1.0)
Monocytes Relative: 5 %
NEUTROS ABS: 8.5 10*3/uL — AB (ref 1.7–7.7)
Neutrophils Relative %: 79 %
Platelets: 299 10*3/uL (ref 150–400)
RBC: 3.91 MIL/uL — AB (ref 4.22–5.81)
RDW: 14.4 % (ref 11.5–15.5)
WBC: 10.8 10*3/uL — AB (ref 4.0–10.5)
nRBC: 0 % (ref 0.0–0.2)

## 2018-08-12 LAB — COMPREHENSIVE METABOLIC PANEL
ALBUMIN: 3.1 g/dL — AB (ref 3.5–5.0)
ALT: 21 U/L (ref 0–44)
AST: 25 U/L (ref 15–41)
Alkaline Phosphatase: 69 U/L (ref 38–126)
Anion gap: 8 (ref 5–15)
BUN: 22 mg/dL — AB (ref 6–20)
CHLORIDE: 99 mmol/L (ref 98–111)
CO2: 28 mmol/L (ref 22–32)
CREATININE: 1.26 mg/dL — AB (ref 0.61–1.24)
Calcium: 8.7 mg/dL — ABNORMAL LOW (ref 8.9–10.3)
GFR calc Af Amer: >60 mL/min (ref >60)
GFR calc non Af Amer: >60 mL/min (ref >60)
Glucose, Bld: 90 mg/dL (ref 70–99)
POTASSIUM: 5.2 mmol/L — AB (ref 3.5–5.1)
Sodium: 135 mmol/L (ref 135–145)
Total Bilirubin: 0.4 mg/dL (ref 0.3–1.2)
Total Protein: 8.2 g/dL — ABNORMAL HIGH (ref 6.5–8.1)

## 2018-08-12 LAB — I-STAT CHEM 8, ED
BUN: 26 mg/dL — AB (ref 6–20)
CALCIUM ION: 1.08 mmol/L — AB (ref 1.15–1.40)
Chloride: 100 mmol/L (ref 98–111)
Creatinine, Ser: 1.2 mg/dL (ref 0.61–1.24)
Glucose, Bld: 92 mg/dL (ref 70–99)
HEMATOCRIT: 34 % — AB (ref 39.0–52.0)
HEMOGLOBIN: 11.6 g/dL — AB (ref 13.0–17.0)
Potassium: 5.3 mmol/L — ABNORMAL HIGH (ref 3.5–5.1)
SODIUM: 136 mmol/L (ref 135–145)
TCO2: 31 mmol/L (ref 22–32)

## 2018-08-12 LAB — URINALYSIS, ROUTINE W REFLEX MICROSCOPIC
Bilirubin Urine: NEGATIVE
GLUCOSE, UA: NEGATIVE mg/dL
KETONES UR: NEGATIVE mg/dL
Nitrite: POSITIVE — AB
PROTEIN: NEGATIVE mg/dL
Specific Gravity, Urine: 1.011 (ref 1.005–1.030)
WBC, UA: >50 WBC/hpf — ABNORMAL HIGH (ref 0–5)
pH: 9 — ABNORMAL HIGH (ref 5.0–8.0)

## 2018-08-12 LAB — I-STAT ARTERIAL BLOOD GAS, ED
ACID-BASE EXCESS: 3 mmol/L — AB (ref 0.0–2.0)
BICARBONATE: 27.8 mmol/L (ref 20.0–28.0)
O2 Saturation: 90 %
TCO2: 29 mmol/L (ref 22–32)
pCO2 arterial: 44.4 mmHg (ref 32.0–48.0)
pH, Arterial: 7.406 (ref 7.350–7.450)
pO2, Arterial: 60 mmHg — ABNORMAL LOW (ref 83.0–108.0)

## 2018-08-12 LAB — CK: Total CK: 281 U/L (ref 49–397)

## 2018-08-12 LAB — RAPID URINE DRUG SCREEN, HOSP PERFORMED
Amphetamines: NOT DETECTED
BARBITURATES: NOT DETECTED
BENZODIAZEPINES: POSITIVE — AB
COCAINE: NOT DETECTED
Opiates: POSITIVE — AB
Tetrahydrocannabinol: NOT DETECTED

## 2018-08-12 LAB — I-STAT CG4 LACTIC ACID, ED: LACTIC ACID, VENOUS: 1.21 mmol/L (ref 0.5–1.9)

## 2018-08-12 LAB — PROTIME-INR
INR: 1.14
Prothrombin Time: 14.5 seconds (ref 11.4–15.2)

## 2018-08-12 LAB — AMMONIA: Ammonia: 16 umol/L (ref 9–35)

## 2018-08-12 LAB — ETHANOL: Alcohol, Ethyl (B): <10 mg/dL (ref <10)

## 2018-08-12 LAB — TROPONIN I

## 2018-08-12 MED ORDER — ONDANSETRON HCL 4 MG/2ML IJ SOLN: 4.0000 mg | Freq: Four times a day (QID) | INTRAMUSCULAR | Status: DC | PRN

## 2018-08-12 MED ORDER — VANCOMYCIN HCL IN DEXTROSE 1-5 GM/200ML-% IV SOLN
1000.0000 mg | Freq: Once | INTRAVENOUS | Status: AC
  Administered 2018-08-12: 1000 mg via INTRAVENOUS
  Filled 2018-08-12: qty 200

## 2018-08-12 MED ORDER — POLYETHYLENE GLYCOL 3350 17 G PO PACK: 17.0000 g | PACK | Freq: Every day | ORAL | Status: DC | PRN

## 2018-08-12 MED ORDER — ENOXAPARIN SODIUM 40 MG/0.4ML ~~LOC~~ SOLN
40.0000 mg | SUBCUTANEOUS | Status: DC
  Administered 2018-08-12: 40 mg via SUBCUTANEOUS
  Filled 2018-08-12: qty 0.4

## 2018-08-12 MED ORDER — SODIUM CHLORIDE 0.9 % IV SOLN
INTRAVENOUS | Status: DC
  Administered 2018-08-12: 22:00:00 via INTRAVENOUS

## 2018-08-12 MED ORDER — ACETAMINOPHEN 10 MG/ML IV SOLN
1000.0000 mg | Freq: Once | INTRAVENOUS | Status: AC
  Administered 2018-08-12: 1000 mg via INTRAVENOUS
  Filled 2018-08-12: qty 100

## 2018-08-12 MED ORDER — THIAMINE HCL 100 MG/ML IJ SOLN
100.0000 mg | Freq: Every day | INTRAMUSCULAR | Status: DC
  Administered 2018-08-13: 100 mg via INTRAVENOUS
  Filled 2018-08-12: qty 2

## 2018-08-12 MED ORDER — NALOXONE HCL 0.4 MG/ML IJ SOLN
0.4000 mg | Freq: Once | INTRAMUSCULAR | Status: AC
  Administered 2018-08-12: 0.4 mg via INTRAVENOUS
  Filled 2018-08-12: qty 1

## 2018-08-12 MED ORDER — SODIUM CHLORIDE 0.9 % IV SOLN
2.0000 g | Freq: Two times a day (BID) | INTRAVENOUS | Status: DC
  Administered 2018-08-13: 2 g via INTRAVENOUS
  Filled 2018-08-12 (×2): qty 2

## 2018-08-12 MED ORDER — ACETAMINOPHEN 325 MG PO TABS: 650.0000 mg | ORAL_TABLET | Freq: Four times a day (QID) | ORAL | Status: DC | PRN

## 2018-08-12 MED ORDER — METRONIDAZOLE IN NACL 5-0.79 MG/ML-% IV SOLN
500.0000 mg | Freq: Three times a day (TID) | INTRAVENOUS | Status: DC
  Administered 2018-08-12 – 2018-08-13 (×2): 500 mg via INTRAVENOUS
  Filled 2018-08-12 (×2): qty 100

## 2018-08-12 MED ORDER — ACETAMINOPHEN 650 MG RE SUPP: 650.0000 mg | Freq: Four times a day (QID) | RECTAL | Status: DC | PRN

## 2018-08-12 MED ORDER — SODIUM CHLORIDE 0.9% FLUSH: 3.0000 mL | Freq: Two times a day (BID) | INTRAVENOUS | Status: DC

## 2018-08-12 MED ORDER — FLUMAZENIL 0.5 MG/5ML IV SOLN
0.2000 mg | Freq: Once | INTRAVENOUS | Status: AC
  Administered 2018-08-12: 0.2 mg via INTRAVENOUS
  Filled 2018-08-12: qty 5

## 2018-08-12 MED ORDER — SODIUM CHLORIDE 0.9 % IV SOLN
2.0000 g | Freq: Once | INTRAVENOUS | Status: AC
  Administered 2018-08-12: 2 g via INTRAVENOUS
  Filled 2018-08-12: qty 2

## 2018-08-12 MED ORDER — IOHEXOL 300 MG/ML  SOLN
100.0000 mL | Freq: Once | INTRAMUSCULAR | Status: AC | PRN
  Administered 2018-08-12: 100 mL via INTRAVENOUS

## 2018-08-12 MED ORDER — ONDANSETRON HCL 4 MG PO TABS: 4.0000 mg | ORAL_TABLET | Freq: Four times a day (QID) | ORAL | Status: DC | PRN

## 2018-08-12 MED ORDER — VANCOMYCIN HCL IN DEXTROSE 1-5 GM/200ML-% IV SOLN
1000.0000 mg | Freq: Two times a day (BID) | INTRAVENOUS | Status: DC
  Administered 2018-08-13: 1000 mg via INTRAVENOUS
  Filled 2018-08-12 (×2): qty 200
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
## 2018-08-12 NOTE — Progress Notes (Signed)
Pharmacy Antibiotic Note  [name] is a 52 y.o. male admitted on 08/12/2018 with sepsis.   Plan: Cefepime 2 g q12h Vanc 1 g q12h Flagyl 500 mg q8 Monitor renal fx cx vt prn  Height: 6' (182.9 cm) Weight: 231 lb 7.7 oz (105 kg) IBW/kg (Calculated) : 77.6  No data recorded.  Recent Labs  Lab 08/12/18 1331 08/12/18 1347  WBC 10.8*  --   CREATININE  --  1.20  LATICACIDVEN  --  1.21    Estimated Creatinine Clearance: 88.2 mL/min (by C-G formula based on SCr of 1.2 mg/dL).    No Known Allergies  Levester Fresh, PharmD, BCPS, BCCCP Clinical Pharmacist [phone]  Please check AMION for all [place] numbers  08/12/2018 2:04 PM

## 2018-08-12 NOTE — ED Notes (Signed)
Condom cath applied
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
## 2018-08-12 NOTE — ED Notes (Signed)
Did in and out cath on patient patient is resting with nurse at bedside and call bell in reach
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
## 2018-08-12 NOTE — ED Notes (Signed)
Pt given 16f catheter to self-cath.  393mL urine obtained.  Pt cleaned and fresh linen placed, pt tolerated well.

## 2018-08-12 NOTE — ED Notes (Signed)
Placed a condom cath on patient. ?

## 2018-08-12 NOTE — ED Notes (Signed)
Pt returned from CT, tolerated well.

## 2018-08-12 NOTE — ED Notes (Signed)
Admitting at bedside 

## 2018-08-12 NOTE — ED Notes (Signed)
Pt transported to CT via stretcher.  

## 2018-08-12 NOTE — Progress Notes (Signed)
Charge RT went to obtain ABG & patient was combative. Hold off on ABG for now per MD.

## 2018-08-12 NOTE — ED Provider Notes (Signed)
Internal medicine will admit.  Altered mental status.  Rest of patient's work-up without significant findings.  Patient reassessed by me still very drowsy.  Given a test dose of flumazenil and did wake up.  So probably his altered mental status is secondary to benzo diazepam overdose.  Patient maintaining his airway fine even prior to receiving this antidote.  Internal medicine will admit.   Fredia Sorrow, MD 08/12/18 [phone]

## 2018-08-12 NOTE — ED Notes (Signed)
Pt noted to be more alert after narcan administration; MD aware

## 2018-08-13 DIAGNOSIS — N179 Acute kidney failure, unspecified: Secondary | ICD-10-CM | POA: Diagnosis present

## 2018-08-13 LAB — BASIC METABOLIC PANEL
Anion gap: 7 (ref 5–15)
BUN: 20 mg/dL (ref 6–20)
CHLORIDE: 103 mmol/L (ref 98–111)
CO2: 27 mmol/L (ref 22–32)
CREATININE: 1.14 mg/dL (ref 0.61–1.24)
Calcium: 8.8 mg/dL — ABNORMAL LOW (ref 8.9–10.3)
GFR calc Af Amer: >60 mL/min (ref >60)
GFR calc non Af Amer: >60 mL/min (ref >60)
Glucose, Bld: 83 mg/dL (ref 70–99)
Potassium: 4.3 mmol/L (ref 3.5–5.1)
Sodium: 137 mmol/L (ref 135–145)

## 2018-08-13 LAB — CBC
HEMATOCRIT: 34.4 % — AB (ref 39.0–52.0)
Hemoglobin: 10.4 g/dL — ABNORMAL LOW (ref 13.0–17.0)
MCH: 26.3 pg (ref 26.0–34.0)
MCHC: 30.2 g/dL (ref 30.0–36.0)
MCV: 86.9 fL (ref 80.0–100.0)
Platelets: 273 10*3/uL (ref 150–400)
RBC: 3.96 MIL/uL — ABNORMAL LOW (ref 4.22–5.81)
RDW: 14.5 % (ref 11.5–15.5)
WBC: 8.5 10*3/uL (ref 4.0–10.5)
nRBC: 0 % (ref 0.0–0.2)

## 2018-08-13 LAB — MRSA PCR SCREENING: MRSA by PCR: NEGATIVE

## 2018-08-13 LAB — GLUCOSE, CAPILLARY: Glucose-Capillary: 98 mg/dL (ref 70–99)

## 2018-08-13 NOTE — Consult Note (Addendum)
WOC consult requested for colostomy assessment and sacrum wound which was present prior to admission.  Pt is preparing to discharge AMA at this time and declines offer of wound assessment. Please re-consult if further assistance is needed.  Thank-you,  Julien Girt MSN, [place], [place], [place], [place]
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
## 2018-08-13 NOTE — Discharge Instructions (Signed)
Thank you for allowing [place] to provide your care. Please follow-up with your doctor today and wound care.

## 2018-08-14 ENCOUNTER — Encounter (HOSPITAL_BASED_OUTPATIENT_CLINIC_OR_DEPARTMENT_OTHER): Payer: Medicare Other | Attending: Physician Assistant

## 2018-08-14 DIAGNOSIS — T402X1A Poisoning by other opioids, accidental (unintentional), initial encounter: Secondary | ICD-10-CM | POA: Insufficient documentation

## 2018-08-14 DIAGNOSIS — I10 Essential (primary) hypertension: Secondary | ICD-10-CM | POA: Insufficient documentation

## 2018-08-14 DIAGNOSIS — K219 Gastro-esophageal reflux disease without esophagitis: Secondary | ICD-10-CM | POA: Insufficient documentation

## 2018-08-14 LAB — URINE CULTURE

## 2018-08-14 NOTE — Progress Notes (Signed)
PHARMACY - PHYSICIAN COMMUNICATION CRITICAL VALUE ALERT - BLOOD CULTURE IDENTIFICATION (BCID)  [name] is an 52 y.o. male who presented to [facility] on 08/12/2018 with a chief complaint of AMS  Assessment: Paraplegic who was here for AMS. He was positive for opiates and benzos. He was dced yesterday. Labs called with BCID results today of 1/4 bottles positive for GPR. This is likely a contaminant. FYI paged MD  Name of physician (or Provider) Contacted: Dr. Eileen Stanford, [place]   Current antibiotics: None  Changes to prescribed antibiotics recommended:  None  Results for orders placed or performed during the hospital encounter of 07/19/16  Blood Culture ID Panel (Reflexed) (Collected: 07/19/2016 10:56 AM)  Result Value Ref Range   Enterococcus species DETECTED (A) NOT DETECTED   Vancomycin resistance NOT DETECTED NOT DETECTED   Listeria monocytogenes NOT DETECTED NOT DETECTED   Staphylococcus species NOT DETECTED NOT DETECTED   Staphylococcus aureus (BCID) NOT DETECTED NOT DETECTED   Streptococcus species NOT DETECTED NOT DETECTED   Streptococcus agalactiae NOT DETECTED NOT DETECTED   Streptococcus pneumoniae NOT DETECTED NOT DETECTED   Streptococcus pyogenes NOT DETECTED NOT DETECTED   Acinetobacter baumannii NOT DETECTED NOT DETECTED   Enterobacteriaceae species NOT DETECTED NOT DETECTED   Enterobacter cloacae complex NOT DETECTED NOT DETECTED   Escherichia coli NOT DETECTED NOT DETECTED   Klebsiella oxytoca NOT DETECTED NOT DETECTED   Klebsiella pneumoniae NOT DETECTED NOT DETECTED   Proteus species NOT DETECTED NOT DETECTED   Serratia marcescens NOT DETECTED NOT DETECTED   Haemophilus influenzae NOT DETECTED NOT DETECTED   Neisseria meningitidis NOT DETECTED NOT DETECTED   Pseudomonas aeruginosa NOT DETECTED NOT DETECTED   Candida albicans NOT DETECTED NOT DETECTED   Candida glabrata NOT DETECTED NOT DETECTED   Candida krusei NOT DETECTED NOT DETECTED   Candida  parapsilosis NOT DETECTED NOT DETECTED   Candida tropicalis NOT DETECTED NOT DETECTED    Onnie Boer, PharmD, BCIDP, AAHIVP, CPP Infectious Disease Pharmacist 08/14/2018 1:44 PM

## 2018-08-15 MED ORDER — HYDRALAZINE HCL 20 MG/ML IJ SOLN
10.00 | INTRAMUSCULAR | Status: DC
Start: ? — End: 2018-08-15

## 2018-08-15 MED ORDER — SODIUM CHLORIDE FLUSH 0.9 % IV SOLN
5.00 | INTRAVENOUS | Status: DC
Start: ? — End: 2018-08-15

## 2018-08-15 MED ORDER — ACETAMINOPHEN 325 MG PO TABS
650.00 | ORAL_TABLET | ORAL | Status: DC
Start: ? — End: 2018-08-15

## 2018-08-15 MED ORDER — ONDANSETRON HCL 4 MG/2ML IJ SOLN
4.00 | INTRAMUSCULAR | Status: DC
Start: ? — End: 2018-08-15

## 2018-08-15 MED ORDER — ENOXAPARIN SODIUM 40 MG/0.4ML ~~LOC~~ SOLN
40.00 | SUBCUTANEOUS | Status: DC
Start: 2018-08-15 — End: 2018-08-15

## 2018-08-15 MED ORDER — SODIUM CHLORIDE FLUSH 0.9 % IV SOLN
5.00 | INTRAVENOUS | Status: DC
Start: 2018-08-15 — End: 2018-08-15

## 2018-08-16 LAB — CULTURE, BLOOD (ROUTINE X 2)

## 2018-08-17 LAB — CULTURE, BLOOD (ROUTINE X 2)
Culture: NO GROWTH
SPECIAL REQUESTS: ADEQUATE

## 2018-08-20 ENCOUNTER — Emergency Department (HOSPITAL_COMMUNITY)
Admission: EM | Admit: 2018-08-20 | Discharge: 2018-08-21 | Disposition: A | Discharge status: Home / Self Care | Payer: Medicare Other | Attending: Emergency Medicine | Admitting: Emergency Medicine

## 2018-08-20 ENCOUNTER — Encounter (HOSPITAL_COMMUNITY): Payer: Self-pay | Admitting: *Deleted

## 2018-08-20 DIAGNOSIS — R45851 Suicidal ideations: Secondary | ICD-10-CM | POA: Diagnosis not present

## 2018-08-20 DIAGNOSIS — G8220 Paraplegia, unspecified: Secondary | ICD-10-CM | POA: Diagnosis not present

## 2018-08-20 DIAGNOSIS — F17210 Nicotine dependence, cigarettes, uncomplicated: Secondary | ICD-10-CM | POA: Diagnosis not present

## 2018-08-20 DIAGNOSIS — F1120 Opioid dependence, uncomplicated: Secondary | ICD-10-CM | POA: Insufficient documentation

## 2018-08-20 DIAGNOSIS — L8990 Pressure ulcer of unspecified site, unspecified stage: Secondary | ICD-10-CM | POA: Diagnosis not present

## 2018-08-20 DIAGNOSIS — I10 Essential (primary) hypertension: Secondary | ICD-10-CM | POA: Diagnosis not present

## 2018-08-20 DIAGNOSIS — F1994 Other psychoactive substance use, unspecified with psychoactive substance-induced mood disorder: Secondary | ICD-10-CM | POA: Diagnosis not present

## 2018-08-20 DIAGNOSIS — F1914 Other psychoactive substance abuse with psychoactive substance-induced mood disorder: Secondary | ICD-10-CM | POA: Insufficient documentation

## 2018-08-20 DIAGNOSIS — Z79899 Other long term (current) drug therapy: Secondary | ICD-10-CM | POA: Insufficient documentation

## 2018-08-20 DIAGNOSIS — F332 Major depressive disorder, recurrent severe without psychotic features: Secondary | ICD-10-CM | POA: Insufficient documentation

## 2018-08-20 DIAGNOSIS — F329 Major depressive disorder, single episode, unspecified: Secondary | ICD-10-CM | POA: Diagnosis present

## 2018-08-20 LAB — CBC
HEMATOCRIT: 30.7 % — AB (ref 39.0–52.0)
Hemoglobin: 9.1 g/dL — ABNORMAL LOW (ref 13.0–17.0)
MCH: 26.7 pg (ref 26.0–34.0)
MCHC: 29.6 g/dL — AB (ref 30.0–36.0)
MCV: 90 fL (ref 80.0–100.0)
PLATELETS: 276 10*3/uL (ref 150–400)
RBC: 3.41 MIL/uL — ABNORMAL LOW (ref 4.22–5.81)
RDW: 14.9 % (ref 11.5–15.5)
WBC: 8.2 10*3/uL (ref 4.0–10.5)
nRBC: 0 % (ref 0.0–0.2)

## 2018-08-20 LAB — COMPREHENSIVE METABOLIC PANEL
ALT: 22 U/L (ref 0–44)
AST: 26 U/L (ref 15–41)
Albumin: 3.4 g/dL — ABNORMAL LOW (ref 3.5–5.0)
Alkaline Phosphatase: 59 U/L (ref 38–126)
Anion gap: 9 (ref 5–15)
BUN: 23 mg/dL — ABNORMAL HIGH (ref 6–20)
CHLORIDE: 105 mmol/L (ref 98–111)
CO2: 27 mmol/L (ref 22–32)
CREATININE: 0.99 mg/dL (ref 0.61–1.24)
Calcium: 8.8 mg/dL — ABNORMAL LOW (ref 8.9–10.3)
Glucose, Bld: 99 mg/dL (ref 70–99)
POTASSIUM: 3.9 mmol/L (ref 3.5–5.1)
SODIUM: 141 mmol/L (ref 135–145)
Total Bilirubin: 0.6 mg/dL (ref 0.3–1.2)
Total Protein: 8.2 g/dL — ABNORMAL HIGH (ref 6.5–8.1)

## 2018-08-20 LAB — RAPID URINE DRUG SCREEN, HOSP PERFORMED
AMPHETAMINES: POSITIVE — AB
BENZODIAZEPINES: NOT DETECTED
Barbiturates: NOT DETECTED
Cocaine: NOT DETECTED
OPIATES: POSITIVE — AB
TETRAHYDROCANNABINOL: NOT DETECTED

## 2018-08-20 LAB — ETHANOL

## 2018-08-20 MED ORDER — ACETAMINOPHEN 500 MG PO TABS
1000.0000 mg | ORAL_TABLET | Freq: Four times a day (QID) | ORAL | Status: DC | PRN
  Administered 2018-08-21: 1000 mg via ORAL
  Filled 2018-08-20 (×2): qty 2

## 2018-08-20 MED ORDER — PANTOPRAZOLE SODIUM 40 MG PO TBEC
40.0000 mg | DELAYED_RELEASE_TABLET | Freq: Every day | ORAL | Status: DC
  Administered 2018-08-20 – 2018-08-21 (×2): 40 mg via ORAL
  Filled 2018-08-20 (×2): qty 1

## 2018-08-20 MED ORDER — AMLODIPINE BESYLATE 5 MG PO TABS
5.0000 mg | ORAL_TABLET | Freq: Every day | ORAL | Status: DC
  Administered 2018-08-21: 5 mg via ORAL
  Filled 2018-08-20: qty 1
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
## 2018-08-20 NOTE — ED Notes (Signed)
Bed: IS30 Expected date:  Expected time:  Means of arrival:  Comments: Hold for 23

## 2018-08-20 NOTE — ED Triage Notes (Signed)
Per EMS, pt from home called crisis hotline d/t suicidal ideation. Pt was supposed to go rehab facility for heroin and oxycodone abuse but was denied when he arrived to facility yesterday.   BP 148/86 HR 80 RR 18
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
## 2018-08-20 NOTE — ED Notes (Signed)
Patients wounds covered with dry gauze, secured with tape and abd pads per pt instructions.

## 2018-08-20 NOTE — ED Notes (Signed)
Bed: [facility] Expected date:  Expected time:  Means of arrival:  Comments: 

## 2018-08-20 NOTE — BHH Counselor (Signed)
Per Priscille Loveless, NP, if pt attempts to leave ED he should be IVC'ed. Pt left [facility] last week AMA & is reporting with SI on this date. He has been recommended for psychiatric hospitalization.

## 2018-08-20 NOTE — ED Notes (Addendum)
Colostomy bag changed. In and out cath performed. 225 urine output obtained,

## 2018-08-20 NOTE — Care Management Note (Signed)
Case Management Note  CM consulted for [facility] services.  CM spoke with pt at bedside to confirm PCP name.  He advised it was Dr. Jimmye Norman.  CM updated information in [facility].  Pt also advised he has an aid from [facility] that comes approx once a week.  Pt reports he has used [facility] in the past.  CM spoke with Dr. Ashok Cordia who would like [facility] wound care for pt.  CM contacted Santiago Glad with [facility] to discuss accepting pt again for services.  She advised pt is non-compliant and has chronic wounds which he is going to the wound clinic for once a month.  She was unable to accept pt for services based on this information and that he has Medicaid.    CM discussed information with Dr. Ashok Cordia who will encourage pt to follow up at the wound clinic.  No further CM needs noted at this time.  Keashia Haskins, Benjaman Lobe, RN 08/20/2018, 3:02 PM

## 2018-08-21 DIAGNOSIS — F1994 Other psychoactive substance use, unspecified with psychoactive substance-induced mood disorder: Secondary | ICD-10-CM

## 2018-08-21 DIAGNOSIS — F332 Major depressive disorder, recurrent severe without psychotic features: Secondary | ICD-10-CM | POA: Diagnosis not present

## 2018-08-21 NOTE — ED Notes (Signed)
[facility] called  

## 2018-08-21 NOTE — ED Notes (Signed)
Explained to patient he is being discharged. Patient states he can not go home. When asked why patient states he is going to kill himself.  Notified Leilani Merl, MD  states ok to proceed with discharge and have patient follow up with outpatient resources and discharge plan.

## 2018-08-21 NOTE — Patient Outreach (Signed)
ED Peer Support Specialist Patient Intake (Complete at intake & 30-60 Day Follow-up)  Name: [name]  MRN: [id number]  Age: 52 y.o.   Date of Admission: 08/21/2018  Intake: Initial Comments:      Primary Reason Admitted: heroin use, depression, SI  Lab values: Alcohol/ETOH: Negative Positive UDS? Yes Amphetamines: Yes Barbiturates: No Benzodiazepines: No Cocaine: No Opiates: Yes Cannabinoids: No  Demographic information: Gender: Male Ethnicity: African American Marital Status: Divorced Insurance Status: Medicare([employer] Medicare) Ecologist (Work Neurosurgeon, Physicist, medical, etc.: Yes(Disability) Lives with: Alone Living situation: House/Apartment  Reported Patient History: Patient reported health conditions: Depression Patient aware of HIV and hepatitis status: Yes (comment)(Negative)  In past year, has patient visited ED for any reason? Yes  Number of ED visits: 3  Reason(s) for visit: altered mental status, acute cystits without hematuria, headache, constipation, drug induced constipation  In past year, has patient been hospitalized for any reason? Yes  Number of hospitalizations: 2  Reason(s) for hospitalization: constipation, altered mental status, acute cystitis  In past year, has patient been arrested? No  Number of arrests:    Reason(s) for arrest:    In past year, has patient been incarcerated? No  Number of incarcerations:    Reason(s) for incarceration:    In past year, has patient received medication-assisted treatment? No  In past year, patient received the following treatments:    In past year, has patient received any harm reduction services? No  Did this include any of the following?    In past year, has patient received care from a mental health provider for diagnosis other than SUD? No  In past year, is this first time patient has overdosed? (has not overdosed)  Number of past overdoses:    In past  year, is this first time patient has been hospitalized for an overdose? (has not overdosed)  Number of hospitalizations for overdose(s):    Is patient currently receiving treatment for a mental health diagnosis? No  Patient reports experiencing difficulty participating in SUD treatment: No    Most important reason(s) for this difficulty?    Has patient received prior services for treatment? No  In past, patient has received services from following agencies:    Plan of Care:  Suggested follow up at these agencies/treatment centers: (Patient wants help with detox resources for his heroin use. [facility] will help with those referrals.  )  Other information: [facility] met with the patient to provide substance use recovery support and help with recovery resources. Patient tried to get into [facility], but patient was denied at their facility due to medical reasons. [facility] talked to the patient about a detox program in [facility]. C[facility] will provide other substance use recovery resources including [facility] contact information.    Mason Jim, [facility]  08/21/2018 1:08 PM

## 2018-08-21 NOTE — ED Notes (Signed)
Patient refused to discuss discharge instruction and sign discharge in e chart. Patient discharge to home via [place].

## 2018-08-21 NOTE — ED Notes (Signed)
Patient has home health needs. Social worker notified for safe discharge plan patient unable to walk.  Peer support and social worker still addressing discharge needs at this time

## 2018-08-21 NOTE — ED Notes (Signed)
Pt requesting something for pain. Reports he is "suffering from withdrawals" and the doctors are not helping him. Tylenol offered for pain but pt refused saying that tylenol will not help. MD made aware.

## 2018-08-21 NOTE — ED Notes (Signed)
[facility] called again. Reports patient was 3rd on list.

## 2018-08-21 NOTE — ED Notes (Signed)
In and out cath returned 350 cc returned.

## 2018-08-21 NOTE — ED Notes (Signed)
[facility]  here to to take patient home. Patient refusing to go home states he feels sick and is not going home. I have explained to patient he has been medically and psychiatrically cleared and he is ready for discharge. Patient still refusing Assist Director  Abigail Butts notified as well as Camera operator and [facility].

## 2018-08-21 NOTE — ED Notes (Signed)
[facility] Tammy notified patient refusing to leave with [facility].  [facility] staff said he voiced SI to them.  Psych Dr Mariea Clonts aware, she said patient is cleared to leave. Pt refusing to leave with [facility]. Pt said " I dare you to try and discharge me, I will call channel 2 news" " funkin try and put me in a wheelchair you'll be sorry"  . Pt told [facility] we have not done anything for him here at the hospital and if he goes home he will kill himself" Pt told Peer support he " didn't feel safe because of his heroin use".  Dr Mariea Clonts made aware a second time, she said pt is clear to leave. Peer support gave pt resources, discussed outpatient detox.

## 2018-08-21 NOTE — ED Notes (Signed)
Sacral dressing changed dry gauze and foam dressing applied. Patient tolerated well

## 2018-08-21 NOTE — BHH Counselor (Addendum)
11.20.27- Per Dr. Mariea Clonts patient is psychiatrically cleared for discharge to IOP.
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
## 2018-08-21 NOTE — Patient Outreach (Signed)
CPSS met with patient to provide further substance use recovery support and help substance use recovery treatment resources. CPSS explained in great detail about information regarding detox resources while providing those resources including a detox center in [facility], [facility] detox program, and [facility] to help with MAT/detox resources. Patient has been refusing to leave the hospital due to not feeling safe with SI and is very concerned that he is going to use heroin again after returning to his home after discharge from the [facility]. CPSS expressed these concerns to the psychiatry team and patient still has been psychiatrically cleared for discharge. CPSS strongly suggested the patient follow up with CPSS after discharge to have further help with getting connected to substance use treatment resources, but the patient is still refusing to leave the hospital and does not want to follow up with CPSS after discharge. CPSS still provided the patient with detox center list, residential/outpatient substance use treatment center list, [facility] contact information/flier on their services, and CPSS contact information. CPSS still strongly encouraged the patient to follow up with CPSS after discharge from the [facility], so CPSS can provide further help with substance use recovery support and further help with getting connected to a substance use treatment facility. [facility] plans to drive the patient back to his home after discharge from [facility].

## 2018-08-21 NOTE — Progress Notes (Signed)
CSW consulted to speak with patient at bedside regarding resources at home. CSW aware RNCM met with patient yesterday. Per notes, patient used to be active with [facility] for his chronic wounds but when RNCM reached out yesterday they stated they could not take patient back due to his non-compliance. Patient states he goes to the wound clinic once a month.   CSW and CPSS spoke with patient regarding substance use resources. CPSS presented various options to patient for how he could receive services for his detox. CSW and CPSS informed that patient does not have supports or friends that could assist him. CPSS explained to patient that they would be able to assist patient in the community once he has his electric wheelchair. CSW and CPSS aware patient's electric wheelchair is at his home. CSW and CPSS informed patient that he was medically and psychiatrically cleared for discharge and that [facility] would be able to take him home. Per patient, he could not leave in his condition. CSW aware [facility] has been called for transport. Patient continues to say he's not leaving. CSW will leave handoff for 2nd shift CSW.  Ollen Barges, [place] Work Department  [employer]  [phone]

## 2018-10-09 ENCOUNTER — Encounter (HOSPITAL_BASED_OUTPATIENT_CLINIC_OR_DEPARTMENT_OTHER): Payer: Medicare Other | Attending: Internal Medicine

## 2018-10-09 DIAGNOSIS — L89314 Pressure ulcer of right buttock, stage 4: Secondary | ICD-10-CM | POA: Diagnosis not present

## 2018-10-09 DIAGNOSIS — L89324 Pressure ulcer of left buttock, stage 4: Secondary | ICD-10-CM | POA: Insufficient documentation

## 2018-10-09 DIAGNOSIS — L89154 Pressure ulcer of sacral region, stage 4: Secondary | ICD-10-CM | POA: Diagnosis not present

## 2018-10-09 DIAGNOSIS — F17210 Nicotine dependence, cigarettes, uncomplicated: Secondary | ICD-10-CM | POA: Insufficient documentation

## 2018-10-09 DIAGNOSIS — G8220 Paraplegia, unspecified: Secondary | ICD-10-CM | POA: Diagnosis not present

## 2018-11-06 ENCOUNTER — Encounter (HOSPITAL_BASED_OUTPATIENT_CLINIC_OR_DEPARTMENT_OTHER): Payer: Medicare Other | Attending: Internal Medicine

## 2018-11-06 DIAGNOSIS — L89322 Pressure ulcer of left buttock, stage 2: Secondary | ICD-10-CM | POA: Insufficient documentation

## 2018-11-06 DIAGNOSIS — L89314 Pressure ulcer of right buttock, stage 4: Secondary | ICD-10-CM | POA: Diagnosis not present

## 2018-11-06 DIAGNOSIS — G8222 Paraplegia, incomplete: Secondary | ICD-10-CM | POA: Diagnosis not present

## 2018-11-06 DIAGNOSIS — L89312 Pressure ulcer of right buttock, stage 2: Secondary | ICD-10-CM | POA: Insufficient documentation

## 2018-11-06 DIAGNOSIS — I10 Essential (primary) hypertension: Secondary | ICD-10-CM | POA: Diagnosis not present

## 2018-11-06 DIAGNOSIS — F17210 Nicotine dependence, cigarettes, uncomplicated: Secondary | ICD-10-CM | POA: Insufficient documentation

## 2018-11-06 DIAGNOSIS — L89154 Pressure ulcer of sacral region, stage 4: Secondary | ICD-10-CM | POA: Insufficient documentation

## 2018-11-18 ENCOUNTER — Ambulatory Visit: Payer: Medicare Other | Attending: Specialist | Admitting: Physical Therapy

## 2018-11-18 DIAGNOSIS — M6281 Muscle weakness (generalized): Secondary | ICD-10-CM | POA: Diagnosis not present

## 2018-11-18 DIAGNOSIS — R2689 Other abnormalities of gait and mobility: Secondary | ICD-10-CM | POA: Insufficient documentation

## 2018-11-19 ENCOUNTER — Encounter: Payer: Self-pay | Admitting: Physical Therapy
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
## 2018-12-04 ENCOUNTER — Encounter (HOSPITAL_BASED_OUTPATIENT_CLINIC_OR_DEPARTMENT_OTHER): Payer: Medicare Other | Attending: Physician Assistant

## 2018-12-04 DIAGNOSIS — F17210 Nicotine dependence, cigarettes, uncomplicated: Secondary | ICD-10-CM | POA: Diagnosis not present

## 2018-12-04 DIAGNOSIS — L89214 Pressure ulcer of right hip, stage 4: Secondary | ICD-10-CM | POA: Insufficient documentation

## 2018-12-04 DIAGNOSIS — L89312 Pressure ulcer of right buttock, stage 2: Secondary | ICD-10-CM | POA: Diagnosis not present

## 2018-12-04 DIAGNOSIS — I10 Essential (primary) hypertension: Secondary | ICD-10-CM | POA: Insufficient documentation

## 2018-12-04 DIAGNOSIS — L89322 Pressure ulcer of left buttock, stage 2: Secondary | ICD-10-CM | POA: Diagnosis not present

## 2018-12-04 DIAGNOSIS — G8220 Paraplegia, unspecified: Secondary | ICD-10-CM | POA: Diagnosis not present

## 2018-12-04 DIAGNOSIS — L89154 Pressure ulcer of sacral region, stage 4: Secondary | ICD-10-CM | POA: Diagnosis not present

## 2019-01-01 ENCOUNTER — Encounter (HOSPITAL_BASED_OUTPATIENT_CLINIC_OR_DEPARTMENT_OTHER): Payer: Medicare Other | Attending: Physician Assistant

## 2019-01-01 DIAGNOSIS — Z9049 Acquired absence of other specified parts of digestive tract: Secondary | ICD-10-CM | POA: Insufficient documentation

## 2019-01-01 DIAGNOSIS — L89322 Pressure ulcer of left buttock, stage 2: Secondary | ICD-10-CM | POA: Insufficient documentation

## 2019-01-01 DIAGNOSIS — L89314 Pressure ulcer of right buttock, stage 4: Secondary | ICD-10-CM | POA: Insufficient documentation

## 2019-01-01 DIAGNOSIS — I10 Essential (primary) hypertension: Secondary | ICD-10-CM | POA: Insufficient documentation

## 2019-01-01 DIAGNOSIS — F17210 Nicotine dependence, cigarettes, uncomplicated: Secondary | ICD-10-CM | POA: Insufficient documentation

## 2019-01-01 DIAGNOSIS — Z933 Colostomy status: Secondary | ICD-10-CM | POA: Insufficient documentation

## 2019-01-01 DIAGNOSIS — L89154 Pressure ulcer of sacral region, stage 4: Secondary | ICD-10-CM | POA: Insufficient documentation

## 2019-01-01 DIAGNOSIS — G8222 Paraplegia, incomplete: Secondary | ICD-10-CM | POA: Insufficient documentation

## 2019-01-15 ENCOUNTER — Other Ambulatory Visit: Payer: Self-pay

## 2019-01-15 DIAGNOSIS — L89314 Pressure ulcer of right buttock, stage 4: Secondary | ICD-10-CM | POA: Diagnosis not present

## 2019-01-15 DIAGNOSIS — I10 Essential (primary) hypertension: Secondary | ICD-10-CM | POA: Diagnosis not present

## 2019-01-15 DIAGNOSIS — F17210 Nicotine dependence, cigarettes, uncomplicated: Secondary | ICD-10-CM | POA: Diagnosis not present

## 2019-01-15 DIAGNOSIS — Z933 Colostomy status: Secondary | ICD-10-CM | POA: Diagnosis not present

## 2019-01-15 DIAGNOSIS — G8222 Paraplegia, incomplete: Secondary | ICD-10-CM | POA: Diagnosis not present

## 2019-01-15 DIAGNOSIS — Z9049 Acquired absence of other specified parts of digestive tract: Secondary | ICD-10-CM | POA: Diagnosis not present

## 2019-01-15 DIAGNOSIS — L89154 Pressure ulcer of sacral region, stage 4: Secondary | ICD-10-CM | POA: Diagnosis not present

## 2019-01-15 DIAGNOSIS — L89322 Pressure ulcer of left buttock, stage 2: Secondary | ICD-10-CM | POA: Diagnosis not present

## 2019-03-05 ENCOUNTER — Other Ambulatory Visit: Payer: Self-pay

## 2019-03-05 ENCOUNTER — Encounter (HOSPITAL_BASED_OUTPATIENT_CLINIC_OR_DEPARTMENT_OTHER): Payer: Medicare Other | Attending: Physician Assistant

## 2019-03-05 DIAGNOSIS — G8222 Paraplegia, incomplete: Secondary | ICD-10-CM | POA: Insufficient documentation

## 2019-03-05 DIAGNOSIS — L89154 Pressure ulcer of sacral region, stage 4: Secondary | ICD-10-CM | POA: Diagnosis not present

## 2019-03-05 DIAGNOSIS — I10 Essential (primary) hypertension: Secondary | ICD-10-CM | POA: Insufficient documentation

## 2019-03-05 DIAGNOSIS — F17210 Nicotine dependence, cigarettes, uncomplicated: Secondary | ICD-10-CM | POA: Diagnosis not present

## 2019-03-05 DIAGNOSIS — L89314 Pressure ulcer of right buttock, stage 4: Secondary | ICD-10-CM | POA: Insufficient documentation

## 2019-04-02 ENCOUNTER — Encounter (HOSPITAL_BASED_OUTPATIENT_CLINIC_OR_DEPARTMENT_OTHER): Payer: Medicare Other | Attending: Physician Assistant

## 2019-04-02 DIAGNOSIS — Z933 Colostomy status: Secondary | ICD-10-CM | POA: Insufficient documentation

## 2019-04-02 DIAGNOSIS — G8222 Paraplegia, incomplete: Secondary | ICD-10-CM | POA: Insufficient documentation

## 2019-04-02 DIAGNOSIS — L89314 Pressure ulcer of right buttock, stage 4: Secondary | ICD-10-CM | POA: Insufficient documentation

## 2019-04-02 DIAGNOSIS — L89154 Pressure ulcer of sacral region, stage 4: Secondary | ICD-10-CM | POA: Insufficient documentation

## 2019-04-02 DIAGNOSIS — F17210 Nicotine dependence, cigarettes, uncomplicated: Secondary | ICD-10-CM | POA: Insufficient documentation

## 2019-04-02 DIAGNOSIS — I10 Essential (primary) hypertension: Secondary | ICD-10-CM | POA: Insufficient documentation

## 2019-04-23 ENCOUNTER — Other Ambulatory Visit: Payer: Self-pay

## 2019-04-23 DIAGNOSIS — I10 Essential (primary) hypertension: Secondary | ICD-10-CM | POA: Diagnosis not present

## 2019-04-23 DIAGNOSIS — L89154 Pressure ulcer of sacral region, stage 4: Secondary | ICD-10-CM | POA: Diagnosis not present

## 2019-04-23 DIAGNOSIS — Z933 Colostomy status: Secondary | ICD-10-CM | POA: Diagnosis not present

## 2019-04-23 DIAGNOSIS — G8222 Paraplegia, incomplete: Secondary | ICD-10-CM | POA: Diagnosis not present

## 2019-04-23 DIAGNOSIS — L89314 Pressure ulcer of right buttock, stage 4: Secondary | ICD-10-CM | POA: Diagnosis not present

## 2019-04-23 DIAGNOSIS — F17210 Nicotine dependence, cigarettes, uncomplicated: Secondary | ICD-10-CM | POA: Diagnosis not present

## 2019-06-04 ENCOUNTER — Encounter (HOSPITAL_BASED_OUTPATIENT_CLINIC_OR_DEPARTMENT_OTHER): Payer: Medicare Other

## 2019-06-25 ENCOUNTER — Other Ambulatory Visit: Payer: Self-pay

## 2019-06-25 ENCOUNTER — Encounter (HOSPITAL_BASED_OUTPATIENT_CLINIC_OR_DEPARTMENT_OTHER): Payer: Medicare Other | Attending: Physician Assistant

## 2019-06-25 DIAGNOSIS — F17210 Nicotine dependence, cigarettes, uncomplicated: Secondary | ICD-10-CM | POA: Diagnosis not present

## 2019-06-25 DIAGNOSIS — L89314 Pressure ulcer of right buttock, stage 4: Secondary | ICD-10-CM | POA: Diagnosis not present

## 2019-06-25 DIAGNOSIS — Z933 Colostomy status: Secondary | ICD-10-CM | POA: Diagnosis not present

## 2019-06-25 DIAGNOSIS — L89154 Pressure ulcer of sacral region, stage 4: Secondary | ICD-10-CM | POA: Insufficient documentation

## 2019-06-25 DIAGNOSIS — I10 Essential (primary) hypertension: Secondary | ICD-10-CM | POA: Insufficient documentation

## 2019-06-25 DIAGNOSIS — G8222 Paraplegia, incomplete: Secondary | ICD-10-CM | POA: Insufficient documentation

## 2019-06-25 DIAGNOSIS — L89324 Pressure ulcer of left buttock, stage 4: Secondary | ICD-10-CM | POA: Diagnosis present

## 2019-07-08 ENCOUNTER — Encounter (HOSPITAL_COMMUNITY): Payer: Self-pay | Admitting: Emergency Medicine

## 2019-07-08 ENCOUNTER — Emergency Department (HOSPITAL_COMMUNITY)
Admission: EM | Admit: 2019-07-08 | Discharge: 2019-07-08 | Discharge status: Against Medical Advice | Payer: Medicare Other | Source: Home / Self Care | Attending: Emergency Medicine | Admitting: Emergency Medicine

## 2019-07-08 ENCOUNTER — Other Ambulatory Visit: Payer: Self-pay

## 2019-07-08 DIAGNOSIS — T40601A Poisoning by unspecified narcotics, accidental (unintentional), initial encounter: Secondary | ICD-10-CM | POA: Insufficient documentation

## 2019-07-08 DIAGNOSIS — I10 Essential (primary) hypertension: Secondary | ICD-10-CM | POA: Insufficient documentation

## 2019-07-08 DIAGNOSIS — T405X1A Poisoning by cocaine, accidental (unintentional), initial encounter: Secondary | ICD-10-CM | POA: Diagnosis not present

## 2019-07-08 DIAGNOSIS — R400 Somnolence: Secondary | ICD-10-CM | POA: Insufficient documentation

## 2019-07-08 DIAGNOSIS — Z5329 Procedure and treatment not carried out because of patient's decision for other reasons: Secondary | ICD-10-CM | POA: Insufficient documentation

## 2019-07-08 DIAGNOSIS — N179 Acute kidney failure, unspecified: Secondary | ICD-10-CM | POA: Diagnosis not present

## 2019-07-08 DIAGNOSIS — Z79899 Other long term (current) drug therapy: Secondary | ICD-10-CM | POA: Insufficient documentation

## 2019-07-08 DIAGNOSIS — F17210 Nicotine dependence, cigarettes, uncomplicated: Secondary | ICD-10-CM | POA: Insufficient documentation
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
## 2019-07-08 NOTE — ED Notes (Signed)
[facility] called  

## 2019-07-08 NOTE — ED Notes (Signed)
PTAR at facility for transport 

## 2019-07-08 NOTE — Discharge Instructions (Signed)
There is help if you need it.  Please do not use dirty needles, this could cause you a severe infection to your skin, heart or spinal cord.  This could kill you or leave you permanently disabled.  There was a recent study done at [facility] that showed that the risk of death for someone that had unintentionally overdosed on narcotics was as high as 15% in the next year.  This is much higher than most every other medical condition.  [place] [facility] Opioid Problem ([facility]) Fixed; mobile; peer-based [name] Holleman [phone] [email or web address] Fixed site exchange at [employer], Wednesdays (2-5pm) and Thursdays (4-8pm). [address] , [place] [address] Call or text to arrange mobile and peer exchange, Mondays (1-4pm) and Fridays (4-7pm). Serving [place]  

## 2019-07-09 ENCOUNTER — Other Ambulatory Visit: Payer: Self-pay

## 2019-07-09 ENCOUNTER — Emergency Department (HOSPITAL_COMMUNITY): Payer: Medicare Other

## 2019-07-09 ENCOUNTER — Ambulatory Visit (HOSPITAL_COMMUNITY): Payer: Self-pay

## 2019-07-09 ENCOUNTER — Inpatient Hospital Stay (HOSPITAL_COMMUNITY)
Admission: EM | Admit: 2019-07-09 | Discharge: 2019-07-11 | DRG: 917 | Disposition: A | Discharge status: Home Health | Payer: Medicare Other | Attending: Internal Medicine | Admitting: Internal Medicine

## 2019-07-09 ENCOUNTER — Encounter (HOSPITAL_COMMUNITY): Payer: Self-pay | Admitting: Emergency Medicine

## 2019-07-09 DIAGNOSIS — F1390 Sedative, hypnotic, or anxiolytic use, unspecified, uncomplicated: Secondary | ICD-10-CM | POA: Diagnosis present

## 2019-07-09 DIAGNOSIS — T405X1A Poisoning by cocaine, accidental (unintentional), initial encounter: Secondary | ICD-10-CM | POA: Diagnosis present

## 2019-07-09 DIAGNOSIS — F1910 Other psychoactive substance abuse, uncomplicated: Secondary | ICD-10-CM

## 2019-07-09 DIAGNOSIS — G8220 Paraplegia, unspecified: Secondary | ICD-10-CM | POA: Diagnosis present

## 2019-07-09 DIAGNOSIS — K219 Gastro-esophageal reflux disease without esophagitis: Secondary | ICD-10-CM | POA: Diagnosis present

## 2019-07-09 DIAGNOSIS — Y92481 Parking lot as the place of occurrence of the external cause: Secondary | ICD-10-CM | POA: Diagnosis not present

## 2019-07-09 DIAGNOSIS — F1410 Cocaine abuse, uncomplicated: Secondary | ICD-10-CM | POA: Diagnosis present

## 2019-07-09 DIAGNOSIS — Z20828 Contact with and (suspected) exposure to other viral communicable diseases: Secondary | ICD-10-CM | POA: Diagnosis present

## 2019-07-09 DIAGNOSIS — I10 Essential (primary) hypertension: Secondary | ICD-10-CM | POA: Diagnosis present

## 2019-07-09 DIAGNOSIS — F17210 Nicotine dependence, cigarettes, uncomplicated: Secondary | ICD-10-CM | POA: Diagnosis present

## 2019-07-09 DIAGNOSIS — E669 Obesity, unspecified: Secondary | ICD-10-CM | POA: Diagnosis present

## 2019-07-09 DIAGNOSIS — Z933 Colostomy status: Secondary | ICD-10-CM

## 2019-07-09 DIAGNOSIS — R4182 Altered mental status, unspecified: Secondary | ICD-10-CM

## 2019-07-09 DIAGNOSIS — B9620 Unspecified Escherichia coli [E. coli] as the cause of diseases classified elsewhere: Secondary | ICD-10-CM | POA: Diagnosis present

## 2019-07-09 DIAGNOSIS — T424X1A Poisoning by benzodiazepines, accidental (unintentional), initial encounter: Secondary | ICD-10-CM | POA: Diagnosis present

## 2019-07-09 DIAGNOSIS — L89154 Pressure ulcer of sacral region, stage 4: Secondary | ICD-10-CM | POA: Diagnosis present

## 2019-07-09 DIAGNOSIS — N390 Urinary tract infection, site not specified: Secondary | ICD-10-CM | POA: Diagnosis present

## 2019-07-09 DIAGNOSIS — G92 Toxic encephalopathy: Secondary | ICD-10-CM | POA: Diagnosis present

## 2019-07-09 DIAGNOSIS — E860 Dehydration: Secondary | ICD-10-CM | POA: Diagnosis present

## 2019-07-09 DIAGNOSIS — Z87440 Personal history of urinary (tract) infections: Secondary | ICD-10-CM | POA: Diagnosis not present

## 2019-07-09 DIAGNOSIS — G9341 Metabolic encephalopathy: Secondary | ICD-10-CM | POA: Diagnosis present

## 2019-07-09 DIAGNOSIS — Z841 Family history of disorders of kidney and ureter: Secondary | ICD-10-CM | POA: Diagnosis not present

## 2019-07-09 DIAGNOSIS — D638 Anemia in other chronic diseases classified elsewhere: Secondary | ICD-10-CM | POA: Diagnosis present

## 2019-07-09 DIAGNOSIS — H5461 Unqualified visual loss, right eye, normal vision left eye: Secondary | ICD-10-CM | POA: Diagnosis present

## 2019-07-09 DIAGNOSIS — T40601A Poisoning by unspecified narcotics, accidental (unintentional), initial encounter: Secondary | ICD-10-CM | POA: Diagnosis present

## 2019-07-09 DIAGNOSIS — N179 Acute kidney failure, unspecified: Secondary | ICD-10-CM | POA: Diagnosis present

## 2019-07-09 DIAGNOSIS — D649 Anemia, unspecified: Secondary | ICD-10-CM

## 2019-07-09 LAB — CBC WITH DIFFERENTIAL/PLATELET
Abs Immature Granulocytes: 0.13 10*3/uL — ABNORMAL HIGH (ref 0.00–0.07)
Basophils Absolute: 0.1 10*3/uL (ref 0.0–0.1)
Basophils Relative: 1 %
Eosinophils Absolute: 0.2 10*3/uL (ref 0.0–0.5)
Eosinophils Relative: 2 %
HCT: 37.6 % — ABNORMAL LOW (ref 39.0–52.0)
Hemoglobin: 11.3 g/dL — ABNORMAL LOW (ref 13.0–17.0)
Immature Granulocytes: 2 %
Lymphocytes Relative: 19 %
Lymphs Abs: 1.7 10*3/uL (ref 0.7–4.0)
MCH: 28.7 pg (ref 26.0–34.0)
MCHC: 30.1 g/dL (ref 30.0–36.0)
MCV: 95.4 fL (ref 80.0–100.0)
Monocytes Absolute: 0.8 10*3/uL (ref 0.1–1.0)
Monocytes Relative: 9 %
Neutro Abs: 5.8 10*3/uL (ref 1.7–7.7)
Neutrophils Relative %: 67 %
Platelets: 214 10*3/uL (ref 150–400)
RBC: 3.94 MIL/uL — ABNORMAL LOW (ref 4.22–5.81)
RDW: 14.9 % (ref 11.5–15.5)
WBC: 8.6 10*3/uL (ref 4.0–10.5)
nRBC: 0 % (ref 0.0–0.2)

## 2019-07-09 LAB — SALICYLATE LEVEL: Salicylate Lvl: <7 mg/dL (ref 2.8–30.0)

## 2019-07-09 LAB — SODIUM, URINE, RANDOM: Sodium, Ur: 45 mmol/L

## 2019-07-09 LAB — RAPID URINE DRUG SCREEN, HOSP PERFORMED
Amphetamines: NOT DETECTED
Barbiturates: NOT DETECTED
Benzodiazepines: POSITIVE — AB
Cocaine: POSITIVE — AB
Opiates: POSITIVE — AB
Tetrahydrocannabinol: NOT DETECTED

## 2019-07-09 LAB — URINALYSIS, ROUTINE W REFLEX MICROSCOPIC
Bilirubin Urine: NEGATIVE
Glucose, UA: NEGATIVE mg/dL
Ketones, ur: NEGATIVE mg/dL
Nitrite: POSITIVE — AB
Protein, ur: NEGATIVE mg/dL
Specific Gravity, Urine: 1.014 (ref 1.005–1.030)
pH: 5 (ref 5.0–8.0)

## 2019-07-09 LAB — CBG MONITORING, ED: Glucose-Capillary: 86 mg/dL (ref 70–99)

## 2019-07-09 LAB — COMPREHENSIVE METABOLIC PANEL
ALT: 25 U/L (ref 0–44)
AST: 30 U/L (ref 15–41)
Albumin: 3.5 g/dL (ref 3.5–5.0)
Alkaline Phosphatase: 87 U/L (ref 38–126)
Anion gap: 14 (ref 5–15)
BUN: 34 mg/dL — ABNORMAL HIGH (ref 6–20)
CO2: 20 mmol/L — ABNORMAL LOW (ref 22–32)
Calcium: 9.2 mg/dL (ref 8.9–10.3)
Chloride: 105 mmol/L (ref 98–111)
Creatinine, Ser: 2.7 mg/dL — ABNORMAL HIGH (ref 0.61–1.24)
GFR calc Af Amer: 29 mL/min — ABNORMAL LOW (ref >60)
GFR calc non Af Amer: 25 mL/min — ABNORMAL LOW (ref >60)
Glucose, Bld: 82 mg/dL (ref 70–99)
Potassium: 4.7 mmol/L (ref 3.5–5.1)
Sodium: 139 mmol/L (ref 135–145)
Total Bilirubin: 0.5 mg/dL (ref 0.3–1.2)
Total Protein: 8.6 g/dL — ABNORMAL HIGH (ref 6.5–8.1)

## 2019-07-09 LAB — SARS CORONAVIRUS 2 (TAT 6-24 HRS): SARS Coronavirus 2: NEGATIVE

## 2019-07-09 LAB — ETHANOL: Alcohol, Ethyl (B): <10 mg/dL (ref <10)

## 2019-07-09 LAB — TROPONIN I (HIGH SENSITIVITY): Troponin I (High Sensitivity): 9 ng/L (ref <18)

## 2019-07-09 LAB — CK: Total CK: 528 U/L — ABNORMAL HIGH (ref 49–397)

## 2019-07-09 LAB — ACETAMINOPHEN LEVEL: Acetaminophen (Tylenol), Serum: <10 ug/mL — ABNORMAL LOW (ref 10–30)

## 2019-07-09 LAB — CREATININE, URINE, RANDOM: Creatinine, Urine: 198.16 mg/dL

## 2019-07-09 MED ORDER — NALOXONE HCL 0.4 MG/ML IJ SOLN
0.4000 mg | Freq: Once | INTRAMUSCULAR | Status: AC
  Administered 2019-07-09: 14:00:00 0.4 mg via INTRAVENOUS
  Filled 2019-07-09: qty 1

## 2019-07-09 MED ORDER — NALOXONE HCL 0.4 MG/ML IJ SOLN
0.4000 mg | INTRAMUSCULAR | Status: DC | PRN
  Administered 2019-07-09: 19:00:00 0.4 mg via INTRAVENOUS
  Filled 2019-07-09: qty 1

## 2019-07-09 MED ORDER — ACETAMINOPHEN 325 MG PO TABS: 650.0000 mg | ORAL_TABLET | Freq: Four times a day (QID) | ORAL | Status: DC | PRN

## 2019-07-09 MED ORDER — SODIUM CHLORIDE 0.9% FLUSH: 3.0000 mL | Freq: Two times a day (BID) | INTRAVENOUS | Status: DC

## 2019-07-09 MED ORDER — ACETAMINOPHEN 650 MG RE SUPP: 650.0000 mg | Freq: Four times a day (QID) | RECTAL | Status: DC | PRN

## 2019-07-09 MED ORDER — SODIUM CHLORIDE 0.9 % IV SOLN
1.0000 g | INTRAVENOUS | Status: DC
  Administered 2019-07-09 – 2019-07-10 (×2): 1 g via INTRAVENOUS
  Filled 2019-07-09: qty 1
  Filled 2019-07-09 (×2): qty 10

## 2019-07-09 MED ORDER — SODIUM CHLORIDE 0.9 % IV BOLUS
1000.0000 mL | Freq: Once | INTRAVENOUS | Status: AC
  Administered 2019-07-09: 1000 mL via INTRAVENOUS

## 2019-07-09 MED ORDER — SODIUM CHLORIDE 0.9 % IV SOLN
INTRAVENOUS | Status: DC
  Administered 2019-07-09 – 2019-07-10 (×2): via INTRAVENOUS

## 2019-07-09 MED ORDER — HEPARIN SODIUM (PORCINE) 5000 UNIT/ML IJ SOLN
5000.0000 [IU] | Freq: Three times a day (TID) | INTRAMUSCULAR | Status: DC
  Administered 2019-07-09 – 2019-07-11 (×4): 5000 [IU] via SUBCUTANEOUS
  Filled 2019-07-09 (×4): qty 1

## 2019-07-09 MED ORDER — ALBUTEROL SULFATE (2.5 MG/3ML) 0.083% IN NEBU: 2.5000 mg | INHALATION_SOLUTION | Freq: Four times a day (QID) | RESPIRATORY_TRACT | Status: DC | PRN

## 2019-07-09 NOTE — ED Notes (Signed)
Pt yells out every once in a while otherwise asleep with his bags on the bed with him at his request

## 2019-07-09 NOTE — ED Notes (Signed)
Pt's CBG result was 86. Informed Santiago Glad - RN.

## 2019-07-09 NOTE — ED Notes (Signed)
Pt is sleeping, easily aroused, O2 sats dropped to 48-60% oxygen placed at 2L/m/[place]--- PA made aware.

## 2019-07-09 NOTE — ED Notes (Signed)
ABG results given to admitting MD. Pt oxygen increased to 6L [place].

## 2019-07-09 NOTE — ED Notes (Signed)
THE PT IS  REPORTING  THAT ONE OF HIS RINGS IS MISSING  AND HE IS ALSO MISSING 1100.00 NOW  SECURITY CALLED TO MAKE A REPORT  ALL HIS BELONGING ARE IN TWO PATIENT CARE BAGS HE HAS BEEN GOING THROUGH.

## 2019-07-09 NOTE — ED Notes (Signed)
The pt is c/o not having his ring and he reports that he has a 1000.00 supposed to be in his belongings  Both bags given to him  He still has both his necklaces on and one ring
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
## 2019-07-09 NOTE — ED Notes (Signed)
Pt remains obtunded-- does respond to verbal stimuli- responded to IV stick and covid test-- speech is clear-- states that he lives in a house by himself.

## 2019-07-09 NOTE — ED Notes (Signed)
Pt. Responded to Naloxone, is alert and talking
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
## 2019-07-09 NOTE — ED Notes (Signed)
PT SCREAMING AND YELLING THAT BLACK LIVES MATTER

## 2019-07-09 NOTE — ED Notes (Addendum)
PT continuing to yell staff members names and stating that he only wants to lay down.  Pt placed on stretcher by several staff members and moved back to H21 where he was before he left.  Pt back asleep, snoring within minutes of laying down as he was while waiting for PTAR to pick him up earlier.  Pt responds to verbal stimuli and states he just wants to sleep.  Note- staff did attempt to call pt's sister earlier to go get his car and wheelchair and she did not answer.

## 2019-07-09 NOTE — ED Notes (Signed)
Pt. Responded to voice rather than pain when aroused. Pt. Followed commands and oriented, Pt. responded better than earlier.  However soon went back to sleep. Vital WDL

## 2019-07-09 NOTE — ED Notes (Signed)
Pt's vehicle is at the [employer] at [address]

## 2019-07-09 NOTE — ED Notes (Signed)
Pt. foley instead of in and out per [place] PA
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
## 2019-07-09 NOTE — Patient Outreach (Signed)
CPSS [name] and I  went to visit with Pt. Pt was sleeping and did not wake up to prompts. CPSS [name] left resources onside table along with contact information.

## 2019-07-09 NOTE — Progress Notes (Signed)
Critical ABG results given to [employer].

## 2019-07-09 NOTE — ED Notes (Signed)
Pt bladder scanned. Highest amount scanned was 293.

## 2019-07-09 NOTE — ED Notes (Signed)
Pt was calling out constantly for a nurse just after he was given narcan iv  He asked for his bags of  Belongings  Ardeen Fillers jas now gone to sleep again

## 2019-07-09 NOTE — ED Notes (Signed)
Patient transported to CT 
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
## 2019-07-10 ENCOUNTER — Encounter (HOSPITAL_COMMUNITY): Payer: Self-pay | Admitting: *Deleted

## 2019-07-10 ENCOUNTER — Other Ambulatory Visit: Payer: Self-pay

## 2019-07-10 LAB — CBC
HCT: 33.1 % — ABNORMAL LOW (ref 39.0–52.0)
Hemoglobin: 10.1 g/dL — ABNORMAL LOW (ref 13.0–17.0)
MCH: 28.3 pg (ref 26.0–34.0)
MCHC: 30.5 g/dL (ref 30.0–36.0)
MCV: 92.7 fL (ref 80.0–100.0)
Platelets: 218 10*3/uL (ref 150–400)
RBC: 3.57 MIL/uL — ABNORMAL LOW (ref 4.22–5.81)
RDW: 14.6 % (ref 11.5–15.5)
WBC: 7.6 10*3/uL (ref 4.0–10.5)
nRBC: 0 % (ref 0.0–0.2)

## 2019-07-10 LAB — BASIC METABOLIC PANEL
Anion gap: 13 (ref 5–15)
BUN: 31 mg/dL — ABNORMAL HIGH (ref 6–20)
CO2: 22 mmol/L (ref 22–32)
Calcium: 8.4 mg/dL — ABNORMAL LOW (ref 8.9–10.3)
Chloride: 107 mmol/L (ref 98–111)
Creatinine, Ser: 1.57 mg/dL — ABNORMAL HIGH (ref 0.61–1.24)
GFR calc Af Amer: 57 mL/min — ABNORMAL LOW (ref >60)
GFR calc non Af Amer: 49 mL/min — ABNORMAL LOW (ref >60)
Glucose, Bld: 97 mg/dL (ref 70–99)
Potassium: 4.5 mmol/L (ref 3.5–5.1)
Sodium: 142 mmol/L (ref 135–145)

## 2019-07-10 LAB — HIV ANTIBODY (ROUTINE TESTING W REFLEX): HIV Screen 4th Generation wRfx: NONREACTIVE

## 2019-07-10 MED ORDER — CHLORHEXIDINE GLUCONATE CLOTH 2 % EX PADS
6.0000 | MEDICATED_PAD | Freq: Every day | CUTANEOUS | Status: DC
  Administered 2019-07-10: 6 via TOPICAL

## 2019-07-10 NOTE — Progress Notes (Signed)
New Admission Note:  Arrival Method: Streycher Mental Orientation:  Alert and oriented x 4 Telemetry Box 6 ST Assessment: Completed Skin: Warm and dry. Flaky and dry  BLE. Decubitus ulcers to Sacrum , L and Right buttocks.  IV: NSL Pain: Denies  Tubes: Colostomy and foley Cath  Safety Measures: Safety Fall Prevention Plan initiated.  Admission: Completed 5 M  Orientation: Patient has been orientated to the room, unit and the staff. Welcome booklet given.  Family: None  Orders have been reviewed and implemented. Will continue to monitor the patient. Call light has been placed within reach and bed alarm has been activated.   Sima Matas BSN, RN  Phone Number: [phone]

## 2019-07-10 NOTE — ED Notes (Signed)
REPORT CALLED TO RN ON 11M
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
## 2019-07-11 LAB — BASIC METABOLIC PANEL
Anion gap: 12 (ref 5–15)
BUN: 18 mg/dL (ref 6–20)
CO2: 22 mmol/L (ref 22–32)
Calcium: 8.5 mg/dL — ABNORMAL LOW (ref 8.9–10.3)
Chloride: 109 mmol/L (ref 98–111)
Creatinine, Ser: 1.11 mg/dL (ref 0.61–1.24)
GFR calc Af Amer: >60 mL/min (ref >60)
GFR calc non Af Amer: >60 mL/min (ref >60)
Glucose, Bld: 86 mg/dL (ref 70–99)
Potassium: 4.8 mmol/L (ref 3.5–5.1)
Sodium: 143 mmol/L (ref 135–145)

## 2019-07-11 LAB — URINE CULTURE: Culture: >=100000 — AB

## 2019-07-11 LAB — CBC
HCT: 33.7 % — ABNORMAL LOW (ref 39.0–52.0)
Hemoglobin: 10.1 g/dL — ABNORMAL LOW (ref 13.0–17.0)
MCH: 28.3 pg (ref 26.0–34.0)
MCHC: 30 g/dL (ref 30.0–36.0)
MCV: 94.4 fL (ref 80.0–100.0)
Platelets: 233 10*3/uL (ref 150–400)
RBC: 3.57 MIL/uL — ABNORMAL LOW (ref 4.22–5.81)
RDW: 14.7 % (ref 11.5–15.5)
WBC: 5.9 10*3/uL (ref 4.0–10.5)
nRBC: 0 % (ref 0.0–0.2)

## 2019-07-11 MED ORDER — CEPHALEXIN 500 MG PO CAPS: 500.0000 mg | ORAL_CAPSULE | Freq: Two times a day (BID) | ORAL | 0 refills | Status: AC

## 2019-07-11 MED ORDER — OLMESARTAN-AMLODIPINE-HCTZ 40-10-25 MG PO TABS: 1.0000 | ORAL_TABLET | Freq: Every day | ORAL | Status: DC

## 2019-07-11 NOTE — Care Management Important Message (Signed)
Important Message  Patient Details  Name: Maxemiliano Montney MRN: [id number] Date of Birth: 01-06-1966   Medicare Important Message Given:  Yes     Davene Jobin 07/11/2019, 2:55 PM
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
## 2019-07-11 NOTE — Plan of Care (Signed)
Refused to change his sacral dressing but allowed to change his colostomy bag.

## 2019-07-11 NOTE — Consult Note (Signed)
[facility] Nurse wound follow up Patient receiving care in [facility] 5M05.  Patient immediately started asking/demanding that he go home.  At first, he refused wound assessment, then reconsidered and allowed.  That is when I discovered he also has a colostomy Wound type: Right and left buttock wounds are scarred but healed over.  The coccyx however is a stage 4 that is painful when people try to pack it according to the patient.  It measures 2 cm x 1.5 cm x 1.5 cm.  It is scarred down and I can't really see the wound bed. No odor, no drainage.  Dressing procedure/placement/frequency: Continue the saline moistened gauze and ABD pads to coccyx wound daily.   I also entered ostomy orders for the following supplies:  Order the following ostomy supplies:  Pouch Lawson #649, skin barrier [name] #2, barrier rings [name] [phone].  Order 3 of each.  I did not take the existing pouch off as I did not have supplies to place a new pouching system.  Monitor the wound area(s) for worsening of condition such as: Signs/symptoms of infection,  Increase in size,  Development of or worsening of odor, Development of pain, or increased pain at the affected locations.  Notify the medical team if any of these develop.  Thank you for the consult.  Discussed plan of care with the patient and bedside nurse.  [place] nurse will not follow at this time.  Please re-consult the [place] team if needed.  Val Riles, RN, MSN, CWOCN, CNS-BC, pager [phone]

## 2019-07-11 NOTE — Plan of Care (Signed)
  Problem: Skin Integrity: Goal: Risk for impaired skin integrity will decrease Outcome: Not Progressing
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
## 2019-07-23 ENCOUNTER — Encounter (HOSPITAL_BASED_OUTPATIENT_CLINIC_OR_DEPARTMENT_OTHER): Payer: Medicare Other | Attending: Physician Assistant | Admitting: Physician Assistant

## 2019-08-06 ENCOUNTER — Other Ambulatory Visit: Payer: Self-pay

## 2019-08-06 ENCOUNTER — Encounter (HOSPITAL_BASED_OUTPATIENT_CLINIC_OR_DEPARTMENT_OTHER): Payer: Medicare Other | Attending: Physician Assistant | Admitting: Physician Assistant

## 2019-08-06 DIAGNOSIS — F17210 Nicotine dependence, cigarettes, uncomplicated: Secondary | ICD-10-CM | POA: Diagnosis not present

## 2019-08-06 DIAGNOSIS — L89154 Pressure ulcer of sacral region, stage 4: Secondary | ICD-10-CM | POA: Insufficient documentation

## 2019-08-06 DIAGNOSIS — G8222 Paraplegia, incomplete: Secondary | ICD-10-CM | POA: Diagnosis not present

## 2019-08-06 DIAGNOSIS — I10 Essential (primary) hypertension: Secondary | ICD-10-CM | POA: Diagnosis not present

## 2019-08-06 DIAGNOSIS — L89324 Pressure ulcer of left buttock, stage 4: Secondary | ICD-10-CM | POA: Insufficient documentation

## 2019-08-06 DIAGNOSIS — L89314 Pressure ulcer of right buttock, stage 4: Secondary | ICD-10-CM | POA: Diagnosis not present

## 2019-08-06 NOTE — Progress Notes (Signed)
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 Past Medical History Notes: decub stg 4 of  sacrum ,wound infections , Musculoskeletal Medical History: Positive for: Osteomyelitis - w/ necrosis sacrum Negative for: Gout; Rheumatoid Arthritis; Osteoarthritis Neurologic Medical History: Positive for: Paraplegia - L1 injury Negative for: Dementia; Neuropathy; Quadriplegia; Seizure Disorder Oncologic Medical History: Negative for: Received Chemotherapy; Received Radiation Immunizations Pneumococcal Vaccine: Received Pneumococcal Vaccination: Yes Implantable Devices No devices added Hospitalization / Surgery History Type of Hospitalization/Surgery colon resection decub surgery eye surgery hip surgery IandD of abscess diverting colostomy Family and Social History Cancer: Yes - Father; Diabetes: No; Heart Disease: No; Hereditary Spherocytosis: No; Hypertension: No; Kidney Disease: Yes - Mother; Lung Disease: No; Seizures: No; Stroke: No; Thyroid Problems: No; Tuberculosis: No; Current every day smoker - 1/2 pack daily; Alcohol Use: Never; Drug Use: Prior History; Caffeine Use: Rarely; Financial Concerns: No; Food, Clothing or Shelter Needs: No; Support System Lacking: No; Transportation Concerns: Yes - [facility] Physician Affirmation I have reviewed and agree with the above information. Electronic Signature(s) Signed: 08/06/2019 6:42:58 PM By: Baruch Gouty RN, BSN Signed: 08/06/2019 6:48:11 PM By: [name] Keeler PA-C Entered By: [name] Keeler on 08/06/2019 12:26:23 -------------------------------------------------------------------------------- SuperBill Details Patient Name: Date of Service: [name], [name] 08/06/2019 Medical Record [id number] Patient Account Number: [id number] Date of Birth/Sex: Treating RN: [birth year] (53 y.o. [name] Primary Care Provider: [name] Other Clinician: Referring Provider: Treating Provider/Extender:Stone III, Delray Alt, Fuller Plan in Treatment: 105 Diagnosis Coding ICD-10 Codes Code  Description [phone] Pressure ulcer of left buttock, stage 4 L89.314 Pressure ulcer of right buttock, stage 4 L89.154 Pressure ulcer of sacral region, stage 4 G82.22 Paraplegia, incomplete F17.218 Nicotine dependence, cigarettes, with other nicotine-induced disorders Facility Procedures CPT4 Code: [id number] Description: [phone] - WOUND CARE VISIT-LEV 5 EST PT Modifier: Quantity: 1 Physician Procedures CPT4 Code: [id number] Description: [id number] - WC PHYS LEVEL 4 - EST PT ICD-10 Diagnosis Description L89.324 Pressure ulcer of left buttock, stage 4 L89.314 Pressure ulcer of right buttock, stage 4 L89.154 Pressure ulcer of sacral region, stage 4 G82.22 Paraplegia, incomplete Modifier: Quantity: 1 Electronic Signature(s) Signed: 08/06/2019 6:48:11 PM By: [name] Keeler PA-C Entered By: [name] Keeler on 08/06/2019 12:27:47

## 2019-09-03 ENCOUNTER — Encounter (HOSPITAL_BASED_OUTPATIENT_CLINIC_OR_DEPARTMENT_OTHER): Payer: Medicare Other | Attending: Physician Assistant | Admitting: Physician Assistant

## 2019-09-03 ENCOUNTER — Other Ambulatory Visit: Payer: Self-pay

## 2019-09-04 NOTE — Progress Notes (Signed)
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 Negative for: Dementia; Neuropathy; Quadriplegia; Seizure  Disorder Oncologic Medical History: Negative for: Received Chemotherapy; Received Radiation Immunizations Pneumococcal Vaccine: Received Pneumococcal Vaccination: Yes Implantable Devices No devices added Hospitalization / Surgery History Type of Hospitalization/Surgery colon resection decub surgery eye surgery hip surgery IandD of abscess diverting colostomy Family and Social History Cancer: Yes - Father; Diabetes: No; Heart Disease: No; Hereditary Spherocytosis: No; Hypertension: No; Kidney Disease: Yes - Mother; Lung Disease: No; Seizures: No; Stroke: No; Thyroid Problems: No; Tuberculosis: No; Current every day smoker - 1/2 pack daily; Alcohol Use: Never; Drug Use: Prior History; Caffeine Use: Rarely; Financial Concerns: No; Food, Clothing or Shelter Needs: No; Support System Lacking: No; Transportation Concerns: Yes - [facility] Physician Affirmation I have reviewed and agree with the above information. Electronic Signature(s) Signed: 09/04/2019 10:36:32 AM By: [name] Keeler PA-C Entered By: [name] Keeler on 09/03/2019 10:18:04 -------------------------------------------------------------------------------- SuperBill Details Patient Name: Date of Service: [name], [name] 09/03/2019 Medical Record [id number] Patient Account Number: [id number] Date of Birth/Sex: Treating RN: 1966-01-25 (53 y.o. 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

## 2019-09-09 NOTE — Progress Notes (Signed)
[name], [name] ([id number]) Visit Report for 08/06/2019 Arrival Information Details Patient Name: Date of Service: [name], [name] 08/06/2019 10:45 AM Medical Record [id number] Patient Account Number: [id number] Date of Birth/Sex: Treating RN: 03-14-1966 (53 y.o. [name] Primary Care Anay Rathe: [name] Other Clinician: Referring Glee Lashomb: Treating Maurine Mowbray/Extender:Stone III, Delray Alt, Fuller Plan in Treatment: 105 Visit Information History Since Last Visit Added or deleted any medications: No Patient Arrived: Wheel Chair Any new allergies or adverse reactions: No Arrival Time: 11:39 Had a fall or experienced change in No activities of daily living that may affect Accompanied By: friend risk of falls: Transfer Assistance: None Signs or symptoms of abuse/neglect since last No Patient Identification Verified: Yes visito Secondary Verification Process Yes Hospitalized since last visit: No Completed: Implantable device outside of the clinic excluding No Patient Requires Transmission-Based No cellular tissue based products placed in the center Precautions: since last visit: Patient Has Alerts: No Has Dressing in Place as Prescribed: Yes Pain Present Now: Yes Electronic Signature(s) Signed: 09/09/2019 3:00:22 PM By: Sandre Kitty Entered By: Sandre Kitty on 08/06/2019 11:39:36 -------------------------------------------------------------------------------- Clinic Level of Care Assessment Details Patient Name: Date of Service: [name], [name] 08/06/2019 10:45 AM Medical Record [id number] Patient Account Number: [id number] Date of Birth/Sex: Treating RN: Jan 22, 1966 (53 y.o. [name] Primary Care Reilley Latorre: [name] Other Clinician: Referring Kelvin Burpee: Treating Legacy Carrender/Extender:Stone III, Delray Alt, Fuller Plan in Treatment: 105 Clinic Level of Care Assessment Items TOOL 4 Quantity Score []  - Use when only  an EandM is performed on FOLLOW-UP visit 0 ASSESSMENTS - Nursing Assessment / Reassessment X - Reassessment of Co-morbidities (includes updates in patient status) 1 10 X - Reassessment of Adherence to Treatment Plan 1 5 ASSESSMENTS - Wound and Skin Assessment / Reassessment []  - Simple Wound Assessment / Reassessment - one wound 0 X - Complex Wound Assessment / Reassessment - multiple wounds 6 5 []  - Dermatologic / Skin Assessment (not related to wound area) 0 ASSESSMENTS - Focused Assessment []  - Circumferential Edema Measurements - multi extremities 0 []  - Nutritional Assessment / Counseling / Intervention 0 []  - Lower Extremity Assessment (monofilament, tuning fork, pulses) 0 []  - Peripheral Arterial Disease Assessment (using hand held doppler) 0 ASSESSMENTS - Ostomy and/or Continence Assessment and Care []  - Incontinence Assessment and Management 0 []  - Ostomy Care Assessment and Management (repouching, etc.) 0 PROCESS - Coordination of Care X - Simple Patient / Family Education for ongoing care 1 15 []  - Complex (extensive) Patient / Family Education for ongoing care 0 X - Staff obtains Programmer, systems, Records, Test Results / Process Orders 1 10 []  - Staff telephones HHA, Nursing Homes / Clarify orders / etc 0 []  - Routine Transfer to another Facility (non-emergent condition) 0 []  - Routine Hospital Admission (non-emergent condition) 0 []  - New Admissions / Biomedical engineer / Ordering NPWT, Apligraf, etc. 0 []  - Emergency Hospital Admission (emergent condition) 0 X - Simple Discharge Coordination 1 10 []  - Complex (extensive) Discharge Coordination 0 PROCESS - Special Needs []  - Pediatric / Minor Patient Management 0 []  - Isolation Patient Management 0 []  - Hearing / Language / Visual special needs 0 []  - Assessment of Community assistance (transportation, D/C planning, etc.) 0 []  - Additional assistance / Altered mentation 0 []  - Support Surface(s) Assessment (bed, cushion,  seat, etc.) 0 INTERVENTIONS - Wound Cleansing / Measurement []  - Simple Wound Cleansing - one wound 0 X - Complex Wound Cleansing - multiple wounds 6 5 X - Wound Imaging (photographs - any  number of wounds) 1 5 []  - Wound Tracing (instead of photographs) 0 []  - Simple Wound Measurement - one wound 0 X - Complex Wound Measurement - multiple wounds 6 5 INTERVENTIONS - Wound Dressings X - Small Wound Dressing one or multiple wounds 6 10 []  - Medium Wound Dressing one or multiple wounds 0 []  - Large Wound Dressing one or multiple wounds 0 X - Application of Medications - topical 1 5 []  - Application of Medications - injection 0 INTERVENTIONS - Miscellaneous []  - External ear exam 0 []  - Specimen Collection (cultures, biopsies, blood, body fluids, etc.) 0 []  - Specimen(s) / Culture(s) sent or taken to Lab for analysis 0 []  - Patient Transfer (multiple staff / Civil Service fast streamer / Similar devices) 0 []  - Simple Staple / Suture removal (25 or less) 0 []  - Complex Staple / Suture removal (26 or more) 0 []  - Hypo / Hyperglycemic Management (close monitor of Blood Glucose) 0 []  - Ankle / Brachial Index (ABI) - do not check if billed separately 0 X - Vital Signs 1 5 Has the patient been seen at the hospital within the last three years: Yes Total Score: 215 Level Of Care: New/Established - Level 5 Electronic Signature(s) Signed: 08/06/2019 6:42:58 PM By: Baruch Gouty RN, BSN Entered By: Baruch Gouty on 08/06/2019 12:24:19 -------------------------------------------------------------------------------- Encounter Discharge Information Details Patient Name: Date of Service: [name], [name] 08/06/2019 10:45 AM Medical Record [id number] Patient Account Number: [id number] Date of Birth/Sex: Treating RN: 1966-01-17 (53 y.o. [name] Primary Care Eloni Darius: [name] Other Clinician: Referring Brigetta Beckstrom: Treating Murat Rideout/Extender:Stone III, Delray Alt, Fuller Plan in  Treatment: [phone] Encounter Discharge Information Items Discharge Condition: Stable Ambulatory Status: Wheelchair Discharge Destination: Home Transportation: Private Auto Accompanied By: self Schedule Follow-up Appointment: Yes Clinical Summary of Care: Electronic Signature(s) Signed: 08/06/2019 6:30:51 PM By: Deon Pilling Entered By: Deon Pilling on 08/06/2019 15:15:47 -------------------------------------------------------------------------------- [place] Details Patient Name: Date of Service: [name], [name] 08/06/2019 10:45 AM Medical Record [id number] Patient Account Number: [id number] Date of Birth/Sex: Treating RN: 24-Feb-1966 (53 y.o. [name] Primary Care Rosio Weiss: [name] Other Clinician: Referring Kentavious Michele: Treating Doristine Shehan/Extender:Stone III, Delray Alt, Fuller Plan in Treatment: 105 Active Inactive Pressure Nursing Diagnoses: Knowledge deficit related to causes and risk factors for pressure ulcer development Knowledge deficit related to management of pressures ulcers Potential for impaired tissue integrity related to pressure, friction, moisture, and shear Goals: Patient will remain free from development of additional pressure ulcers Date Inactivated: 08/14/2018 Target11/20/2019 Resolution Date Initiated: 12/12/2017 Date: Goal Status: Unmet Unmet Reason: hospitalized Patient will remain free of pressure ulcers Date Initiated: 12/12/2017 Date Inactivated: 02/06/2018 Target Resolution Date: 02/06/2018 Unmet Reason: pt Goal Status: Unmet noncompliant wth offloading Patient/caregiver will verbalize understanding of pressure ulcer management Date Initiated: 12/12/2017 Target Resolution Date: 09/03/2019 Goal Status: Active Interventions: Assess: immobility, friction, shearing, incontinence upon admission and as needed Assess offloading mechanisms upon admission and as needed Assess potential for pressure ulcer upon  admission and as needed Notes: Wound/Skin Impairment Nursing Diagnoses: Impaired tissue integrity Goals: Patient/caregiver will verbalize understanding of skin care regimen Date Initiated: 03/06/2018 Target Resolution Date: 09/03/2019 Goal Status: Active Ulcer/skin breakdown will heal within 14 weeks Date Initiated: 07/31/2017 Date Inactivated: 01/09/2018 Target Resolution Date: 11/30/2017 Unmet Goal Status: Unmet Reason: noncompliant with offloading Interventions: Assess patient/caregiver ability to perform ulcer/skin care regimen upon admission and as needed Notes: Electronic Signature(s) Signed: 08/06/2019 6:42:58 PM By: Baruch Gouty RN, BSN Entered By: Baruch Gouty on 08/06/2019 12:23:01 -------------------------------------------------------------------------------- Pain Assessment  Details Patient Name: Date of Service: [name], [name] 08/06/2019 10:45 AM Medical Record [id number] Patient Account Number: [id number] Date of Birth/Sex: Treating RN: 1966/02/11 (53 y.o. [name] Primary Care Trammell Bowden: [name] Other Clinician: Referring Nikyla Navedo: Treating Jaedynn Bohlken/Extender:Stone III, Delray Alt, Fuller Plan in Treatment: 105 Active Problems Location of Pain Severity and Description of Pain Patient Has Paino Yes Site Locations Site Locations Rate the pain. Current Pain Level: 10 Pain Management and Medication Current Pain Management: Electronic Signature(s) Signed: 08/06/2019 6:42:58 PM By: Baruch Gouty RN, BSN Signed: 09/09/2019 3:00:22 PM By: Sandre Kitty Entered By: Sandre Kitty on 08/06/2019 11:44:06 -------------------------------------------------------------------------------- Patient/Caregiver Education Details Patient Name: Date of Service: JOSIA, LITWILLER 11/4/2020andnbsp10:45 AM Medical Record [id number] Patient Account Number: [id number] Date of Birth/Gender: Treating RN: 1966/01/13 (53 y.o. [name] Primary Care Physician: [name] Other Clinician: Referring Physician: Treating Physician/Extender:Stone III, Delray Alt, Fuller Plan in Treatment: [phone] Education Assessment Education Provided To: Patient Education Topics Provided Pressure: Methods: Explain/Verbal Responses: Reinforcements needed, State content correctly Wound/Skin Impairment: Methods: Explain/Verbal Responses: Reinforcements needed, State content correctly Electronic Signature(s) Signed: 08/06/2019 6:42:58 PM By: Baruch Gouty RN, BSN Entered By: Baruch Gouty on 08/06/2019 12:23:22 -------------------------------------------------------------------------------- Wound Assessment Details Patient Name: Date of Service: [name], [name] 08/06/2019 10:45 AM Medical Record [id number] Patient Account Number: [id number] Date of Birth/Sex: Treating RN: 25-Jul-1966 (53 y.o. [name] Primary Care Marsha Hillman: [name] Other Clinician: Referring Saban Heinlen: Treating Federico Maiorino/Extender:Stone III, Delray Alt, Fuller Plan in Treatment: 105 Wound Status Wound Number: 17 Primary Pressure Ulcer Etiology: Wound Location: Right Ischial Tuberosity Wound Open Wounding Event: Pressure Injury Status: Date Acquired: 10/02/2013 Comorbid Anemia, Hypertension, Colitis, Weeks Of Treatment: 105 History: Osteomyelitis, Paraplegia Clustered Wound: No Photos Wound Measurements Length: (cm) 0.8 % Reduction in Width: (cm) 0.4 % Reduction in Depth: (cm) 3.6 Epithelializati Area: (cm) 0.251 Tunneling: Volume: (cm) 0.905 Undermining: Starting Pos Ending Posit Maximum Dist Area: 98.9% Volume: 97.5% on: Small (1-33%) No Yes ition (o'clock): 12 ion (o'clock): 12 ance: (cm) 3.3 Wound Description Classification: Category/Stage IV Foul Odor Afte Wound Margin: Fibrotic scar, thickened scar Slough/Fibrino Exudate Amount: Medium Exudate Type: Serosanguineous Exudate Color: red,  brown Wound Bed Granulation Amount: Large (67-100%) Granulation Quality: Pink, Pale Fascia Exposed: Necrotic Amount: Small (1-33%) Fat Layer (Subc Necrotic Quality: Adherent Slough Tendon Exposed: Muscle Exposed: Joint Expo Bone Expos r Cleansing: No Yes Exposed Structure No utaneous Tissue) Exposed: Yes No No sed: No ed: No Electronic Signature(s) Signed: 08/08/2019 6:12:06 PM By: Baruch Gouty RN, BSN Signed: 08/11/2019 4:06:15 PM By: Mikeal Hawthorne EMT/HBOT Previous Signature: 08/06/2019 6:42:58 PM Version By: Baruch Gouty RN, BSN Entered By: Mikeal Hawthorne on 08/08/2019 09:35:19 -------------------------------------------------------------------------------- Wound Assessment Details Patient Name: Date of Service: [name], [name] 08/06/2019 10:45 AM Medical Record [id number] Patient Account Number: [id number] Date of Birth/Sex: Treating RN: Aug 08, 1966 (53 y.o. [name] Primary Care Jerett Odonohue: [name] Other Clinician: Referring Aryssa Rosamond: Treating Rosezella Kronick/Extender:Stone III, Delray Alt, Fuller Plan in Treatment: 105 Wound Status Wound Number: 19 Primary Pressure Ulcer Etiology: Wound Location: Sacrum Wound Open Wounding Event: Pressure Injury Status: Date Acquired: 10/02/2013 Comorbid Anemia, Hypertension, Colitis, Weeks Of Treatment: 105 History: Osteomyelitis, Paraplegia Clustered Wound: No Photos Wound Measurements Length: (cm) 1.5 Width: (cm) 1 Depth: (cm) 1 Area: (cm) 1.178 Volume: (cm) 1.178 % Reduction in Area: 44.9% % Reduction in Volume: 57.6% Epithelialization: Medium (34-66%) Tunneling: No Undermining: Yes Starting Position (o'clock): 12 Ending Position (o'clock): 12 Maximum Distance: (cm) 2.4 Wound Description Classification: Category/Stage IV Wound Margin: Fibrotic scar, thickened scar Exudate Amount:  Medium Exudate Type: Serosanguineous Exudate Color: red, brown Wound Bed Granulation Amount: Large  (67-100%) Granulation Quality: Pink Necrotic Amount: None Present (0%) Foul Odor After Cleansing: No Slough/Fibrino No Exposed Structure Fascia Exposed: No Fat Layer (Subcutaneous Tissue) Exposed: Yes Tendon Exposed: No Muscle Exposed: No Joint Exposed: No Bone Exposed: No Electronic Signature(s) Signed: 08/08/2019 6:12:06 PM By: Baruch Gouty RN, BSN Signed: 08/11/2019 4:06:15 PM By: Mikeal Hawthorne EMT/HBOT Previous Signature: 08/06/2019 6:42:58 PM Version By: Baruch Gouty RN, BSN Entered By: Mikeal Hawthorne on 08/08/2019 09:30:11 -------------------------------------------------------------------------------- Wound Assessment Details Patient Name: Date of Service: [name], [name] 08/06/2019 10:45 AM Medical Record [id number] Patient Account Number: [id number] Date of Birth/Sex: Treating RN: 06-09-1966 (53 y.o. [name] Primary Care Jakayla Schweppe: [name] Other Clinician: Referring Tanvi Gatling: Treating Reynolds Kittel/Extender:Stone III, Delray Alt, Fuller Plan in Treatment: 105 Wound Status Wound Number: 21 Primary Pressure Ulcer Etiology: Wound Location: Right Ischium - Medial Wound Open Wounding Event: Gradually Appeared Status: Date Acquired: 10/02/2012 Comorbid Anemia, Hypertension, Colitis, Weeks Of Treatment: 70 History: Osteomyelitis, Paraplegia Clustered Wound: No Photos Wound Measurements Length: (cm) 0.3 % Reductio Width: (cm) 1 % Reductio Depth: (cm) 0.1 Epithelial Area: (cm) 0.236 Tunneling Volume: (cm) 0.024 Undermini Wound Description Classification: Category/Stage II Wound Margin: Well defined, not attached Exudate Amount: Small Exudate Type: Serosanguineous Exudate Color: red, brown Wound Bed Granulation Amount: Large (67-100%) Granulation Quality: Pink, Pale Necrotic Amount: None Present (0%) Foul Odor After Cleansing: No Slough/Fibrino No Exposed Structure Fascia Exposed: No Fat Layer (Subcutaneous Tissue) Exposed:  Yes Tendon Exposed: No Muscle Exposed: No Joint Exposed: No Bone Exposed: No n in Area: 91.7% n in Volume: 99.6% ization: Small (1-33%) : No ng: No Electronic Signature(s) Signed: 08/08/2019 6:12:06 PM By: Baruch Gouty RN, BSN Signed: 08/11/2019 4:06:15 PM By: Mikeal Hawthorne EMT/HBOT Previous Signature: 08/06/2019 6:42:58 PM Version By: Baruch Gouty RN, BSN Entered By: Mikeal Hawthorne on 08/08/2019 09:32:43 -------------------------------------------------------------------------------- Wound Assessment Details Patient Name: Date of Service: [name], [name] 08/06/2019 10:45 AM Medical Record [id number] Patient Account Number: [id number] Date of Birth/Sex: Treating RN: 02-26-1966 (53 y.o. [name] Primary Care Keanna Tugwell: [name] Other Clinician: Referring Lexa Coronado: Treating Ivee Poellnitz/Extender:Stone III, Delray Alt, Fuller Plan in Treatment: 105 Wound Status Wound Number: 22 Primary Pressure Ulcer Etiology: Wound Location: Left Ischium Wound Open Wounding Event: Gradually Appeared Status: Date Acquired: 10/02/2013 Comorbid Anemia, Hypertension, Colitis, Weeks Of Treatment: 70 History: Osteomyelitis, Paraplegia Clustered Wound: No Photos Wound Measurements Length: (cm) 2 Width: (cm) 0.7 Depth: (cm) 2.1 Area: (cm) 1.1 Volume: (cm) 2.309 % Reduction in Area: -159.4% % Reduction in Volume: -94.4% Epithelialization: Small (1-33%) Tunneling: No Undermining: Yes Starting Position (o'clock): 12 Ending Position (o'clock): 12 Maximum Distance: (cm) 3.5 Wound Description Classification: Category/Stage II Wound Margin: Well defined, not attached Exudate Amount: Medium Exudate Type: Serosanguineous Exudate Color: red, brown Wound Bed Granulation Amount: Large (67-100%) Granulation Quality: Pink Necrotic Amount: None Present (0%) Foul Odor After Cleansing: No Slough/Fibrino No Exposed Structure Fascia Exposed: No Fat Layer (Subcutaneous  Tissue) Exposed: Yes Tendon Exposed: No Muscle Exposed: No Joint Exposed: No Bone Exposed: No Electronic Signature(s) Signed: 08/08/2019 6:12:06 PM By: Baruch Gouty RN, BSN Signed: 08/11/2019 4:06:15 PM By: Mikeal Hawthorne EMT/HBOT Previous Signature: 08/06/2019 6:42:58 PM Version By: Baruch Gouty RN, BSN Entered By: Mikeal Hawthorne on 08/08/2019 09:30:53 -------------------------------------------------------------------------------- Wound Assessment Details Patient Name: Date of Service: [name], [name] 08/06/2019 10:45 AM Medical Record [id number] Patient Account Number: [id number] Date of Birth/Sex: Treating RN: 05/09/1966 (53 y.o. [name] Primary Care Issac Moure: [name] Other  Clinician: Referring Alyzza Andringa: Treating Jorja Empie/Extender:Stone III, Delray Alt, Dwight Weeks in Treatment: 105 Wound Status Wound Number: 23 Primary Pressure Ulcer Etiology: Wound Location: Right Gluteus Wound Open Wounding Event: Gradually Appeared Status: Date Acquired: 09/18/2018 Comorbid Anemia, Hypertension, Colitis, Weeks Of Treatment: 43 History: Osteomyelitis, Paraplegia Clustered Wound: No Photos Wound Measurements Length: (cm) 0.3 % Reductio Width: (cm) 2.5 % Reductio Depth: (cm) 0.1 Epithelial Area: (cm) 0.589 Tunneling Volume: (cm) 0.059 Undermini Wound Description Classification: Category/Stage II Wound Margin: Well defined, not attached Exudate Amount: Medium Exudate Type: Serosanguineous Exudate Color: red, brown Wound Bed Granulation Amount: Large (67-100%) Granulation Quality: Pink Necrotic Amount: None Present (0%) Foul Odor After Cleansing: No Slough/Fibrino No Exposed Structure Fascia Exposed: No Fat Layer (Subcutaneous Tissue) Exposed: Yes Tendon Exposed: No Muscle Exposed: No Joint Exposed: No Bone Exposed: No n in Area: -70.2% n in Volume: 93.9% ization: Small (1-33%) : No ng: No Electronic Signature(s) Signed: 08/08/2019  6:12:06 PM By: Baruch Gouty RN, BSN Signed: 08/11/2019 4:06:15 PM By: Mikeal Hawthorne EMT/HBOT Previous Signature: 08/06/2019 6:42:58 PM Version By: Baruch Gouty RN, BSN Entered By: Mikeal Hawthorne on 08/08/2019 09:31:17 -------------------------------------------------------------------------------- Wound Assessment Details Patient Name: Date of Service: [name], [name] 08/06/2019 10:45 AM Medical Record [id number] Patient Account Number: [id number] Date of Birth/Sex: Treating RN: Dec 16, 1965 (53 y.o. [name] Primary Care Lenah Messenger: [name] Other Clinician: Referring Tiyanna Larcom: Treating Kalim Kissel/Extender:Stone III, Delray Alt, Fuller Plan in Treatment: 105 Wound Status Wound Number: 24 Primary Pressure Ulcer Etiology: Wound Location: Right Scrotum Wound Open Wounding Event: Gradually Appeared Wounding Event: Gradually Appeared Status: Date Acquired: 08/06/2019 Comorbid Anemia, Hypertension, Colitis, Weeks Of Treatment: 0 History: Osteomyelitis, Paraplegia Clustered Wound: No Photos Wound Measurements Length: (cm) 3.1 % Reduction Width: (cm) 1.1 % Reduction Depth: (cm) 0.1 Epitheliali Area: (cm) 2.678 Tunneling: Volume: (cm) 0.268 Underminin Wound Description Classification: Category/Stage III Foul Odor Wound Margin: Flat and Intact Slough/Fi Exudate Amount: Medium Exudate Type: Serosanguineous Exudate Color: red, brown Wound Bed Granulation Amount: Large (67-100%) Granulation Quality: Red Fascia Exp Necrotic Amount: Small (1-33%) Fat Layer Necrotic Quality: Adherent Slough Tendon Exp Muscle Exp Joint Expo Bone Expos After Cleansing: No brino Yes Exposed Structure osed: No (Subcutaneous Tissue) Exposed: Yes osed: No osed: No sed: No ed: No in Area: 0% in Volume: 0% zation: None No g: No Electronic Signature(s) Signed: 08/08/2019 6:12:06 PM By: Baruch Gouty RN, BSN Signed: 08/11/2019 4:06:15 PM By: Mikeal Hawthorne  EMT/HBOT Previous Signature: 08/06/2019 6:42:58 PM Version By: Baruch Gouty RN, BSN Entered By: Mikeal Hawthorne on 08/08/2019 09:30:32 -------------------------------------------------------------------------------- Vitals Details Patient Name: Date of Service: [name], [name] 08/06/2019 10:45 AM Medical Record [id number] Patient Account Number: [id number] Date of Birth/Sex: Treating RN: Dec 07, 1965 (53 y.o. [name] Primary Care Kacey Dysert: [name] Other Clinician: Referring Keyden Pavlov: Treating Lindsy Cerullo/Extender:Stone III, Delray Alt, Fuller Plan in Treatment: 105 Vital Signs Time Taken: 11:39 Temperature (F): 98.3 Height (in): 72 Pulse (bpm): 73 Weight (lbs): 215 Respiratory Rate (breaths/min): 18 Body Mass Index (BMI): 29.2 Blood Pressure (mmHg): 133/53 Reference Range: 80 - 120 mg / dl Electronic Signature(s) Signed: 09/09/2019 3:00:22 PM By: Sandre Kitty Entered By: Sandre Kitty on 08/06/2019 11:43:54

## 2019-09-09 NOTE — Progress Notes (Signed)
DEO, LATINI ([id number]) Visit Report for 06/25/2019 Arrival Information Details Patient Name: Date of Service: [name], [name] 06/25/2019 9:30 AM Medical Record [id number] Patient Account Number: [id number] Date of Birth/Sex: Treating RN: 06/20/1966 (53 y.o. [name] Primary Care Amanada Philbrick: [name] Other Clinician: Sandre Kitty Referring Tresia Revolorio: Treating Cristyn Crossno/Extender:Stone III, Delray Alt, Fuller Plan in Treatment: 99 Visit Information History Since Last Visit Added or deleted any medications: No Patient Arrived: Wheel Chair Any new allergies or adverse reactions: No Arrival Time: 09:56 Had a fall or experienced change in No activities of daily living that may affect Accompanied By: friend risk of falls: Transfer Assistance: None Signs or symptoms of abuse/neglect since last No Patient Identification Verified: Yes visito Secondary Verification Process Yes Hospitalized since last visit: No Completed: Implantable device outside of the clinic excluding No Patient Requires Transmission-Based No cellular tissue based products placed in the center Precautions: since last visit: Patient Has Alerts: No Has Dressing in Place as Prescribed: Yes Pain Present Now: Yes Electronic Signature(s) Signed: 09/09/2019 3:04:24 PM By: Sandre Kitty Entered By: Sandre Kitty on 06/25/2019 09:56:41 -------------------------------------------------------------------------------- Clinic Level of Care Assessment Details Patient Name: Date of Service: [name], [name] 06/25/2019 9:30 AM Medical Record [id number] Patient Account Number: [id number] Date of Birth/Sex: Treating RN: 07/03/1966 (53 y.o. [name] Primary Care Bryam Taborda: [name] Other Clinician: Sandre Kitty Referring Marrah Vanevery: Treating Evonne Rinks/Extender:Stone III, Delray Alt, Fuller Plan in Treatment: 99 Clinic Level of Care Assessment Items TOOL 4  Quantity Score []  - Use when only an EandM is performed on FOLLOW-UP visit 0 ASSESSMENTS - Nursing Assessment / Reassessment X - Reassessment of Co-morbidities (includes updates in patient status) 1 10 X - Reassessment of Adherence to Treatment Plan 1 5 ASSESSMENTS - Wound and Skin Assessment / Reassessment []  - Simple Wound Assessment / Reassessment - one wound 0 X - Complex Wound Assessment / Reassessment - multiple wounds 5 5 []  - Dermatologic / Skin Assessment (not related to wound area) 0 ASSESSMENTS - Focused Assessment []  - Circumferential Edema Measurements - multi extremities 0 []  - Nutritional Assessment / Counseling / Intervention 0 []  - Lower Extremity Assessment (monofilament, tuning fork, pulses) 0 []  - Peripheral Arterial Disease Assessment (using hand held doppler) 0 ASSESSMENTS - Ostomy and/or Continence Assessment and Care []  - Incontinence Assessment and Management 0 []  - Ostomy Care Assessment and Management (repouching, etc.) 0 PROCESS - Coordination of Care X - Simple Patient / Family Education for ongoing care 1 15 []  - Complex (extensive) Patient / Family Education for ongoing care 0 X - Staff obtains Programmer, systems, Records, Test Results / Process Orders 1 10 []  - Staff telephones HHA, Nursing Homes / Clarify orders / etc 0 []  - Routine Transfer to another Facility (non-emergent condition) 0 []  - Routine Hospital Admission (non-emergent condition) 0 []  - New Admissions / Biomedical engineer / Ordering NPWT, Apligraf, etc. 0 []  - Emergency Hospital Admission (emergent condition) 0 X - Simple Discharge Coordination 1 10 []  - Complex (extensive) Discharge Coordination 0 PROCESS - Special Needs []  - Pediatric / Minor Patient Management 0 []  - Isolation Patient Management 0 []  - Hearing / Language / Visual special needs 0 []  - Assessment of Community assistance (transportation, D/C planning, etc.) 0 []  - Additional assistance / Altered mentation 0 []  - Support  Surface(s) Assessment (bed, cushion, seat, etc.) 0 INTERVENTIONS - Wound Cleansing / Measurement []  - Simple Wound Cleansing - one wound 0 X - Complex Wound Cleansing - multiple wounds 5 5 X - Wound  Imaging (photographs - any number of wounds) 1 5 []  - Wound Tracing (instead of photographs) 0 []  - Simple Wound Measurement - one wound 0 X - Complex Wound Measurement - multiple wounds 5 5 INTERVENTIONS - Wound Dressings X - Small Wound Dressing one or multiple wounds 5 10 []  - Medium Wound Dressing one or multiple wounds 0 []  - Large Wound Dressing one or multiple wounds 0 X - Application of Medications - topical 1 5 []  - Application of Medications - injection 0 INTERVENTIONS - Miscellaneous []  - External ear exam 0 []  - Specimen Collection (cultures, biopsies, blood, body fluids, etc.) 0 []  - Specimen(s) / Culture(s) sent or taken to Lab for analysis 0 []  - Patient Transfer (multiple staff / Civil Service fast streamer / Similar devices) 0 []  - Simple Staple / Suture removal (25 or less) 0 []  - Complex Staple / Suture removal (26 or more) 0 []  - Hypo / Hyperglycemic Management (close monitor of Blood Glucose) 0 []  - Ankle / Brachial Index (ABI) - do not check if billed separately 0 X - Vital Signs 1 5 Has the patient been seen at the hospital within the last three years: Yes Total Score: 190 Level Of Care: New/Established - Level 5 Electronic Signature(s) Signed: 06/25/2019 5:29:01 PM By: Baruch Gouty RN, BSN Entered By: Baruch Gouty on 06/25/2019 10:33:45 -------------------------------------------------------------------------------- Encounter Discharge Information Details Patient Name: Date of Service: [name], [name] 06/25/2019 9:30 AM Medical Record [id number] Patient Account Number: [id number] Date of Birth/Sex: Treating RN: [birth year] (53 y.o. [name] Primary Care Rochele Lueck: [name] Other Clinician: Sandre Kitty Referring Jveon Pound: Treating  Annamay Laymon/Extender:Stone III, Delray Alt, Fuller Plan in Treatment: 99 Encounter Discharge Information Items Discharge Condition: Stable Ambulatory Status: Wheelchair Discharge Destination: Home Transportation: Private Auto Accompanied By: self Schedule Follow-up Appointment: Yes Clinical Summary of Care: Electronic Signature(s) Signed: 06/25/2019 5:12:23 PM By: Deon Pilling Entered By: Deon Pilling on 06/25/2019 11:03:26 -------------------------------------------------------------------------------- Lower Extremity Assessment Details Patient Name: Date of Service: [name], [name] 06/25/2019 9:30 AM Medical Record [id number] Patient Account Number: [id number] Date of Birth/Sex: Treating RN: [birth year] (53 y.o. [name] Primary Care Deigo Alonso: [name] Other Clinician: Sandre Kitty Referring Sundra Haddix: Treating Zyiere Rosemond/Extender:Stone III, Delray Alt, Fuller Plan in Treatment: [id number] Electronic Signature(s) Signed: 06/25/2019 5:29:01 PM By: Baruch Gouty RN, BSN Signed: 09/09/2019 3:04:24 PM By: Sandre Kitty Entered By: Sandre Kitty on 06/25/2019 09:57:13 -------------------------------------------------------------------------------- Multi-Disciplinary Care Plan Details Patient Name: Date of Service: [name], [name] 06/25/2019 9:30 AM Medical Record [id number] Patient Account Number: [id number] Date of Birth/Sex: Treating RN: 1966/05/17 (53 y.o. [name] Primary Care Jase Himmelberger: Other Clinician: Henrene Hawking Referring Takirah Binford: Treating Kimber Fritts/Extender:Stone III, Delray Alt, Fuller Plan in Treatment: 99 Active Inactive Pressure Nursing Diagnoses: Knowledge deficit related to causes and risk factors for pressure ulcer development Knowledge deficit related to management of pressures ulcers Potential for impaired tissue integrity related to pressure, friction, moisture, and  shear Goals: Patient will remain free from development of additional pressure ulcers Target Resolution Date Initiated: 12/12/2017 Date Inactivated: 08/14/2018 Date: 08/21/2018 Goal Status: Unmet Unmet Reason: hospitalized Patient will remain free of pressure ulcers Date Initiated: 12/12/2017 Date Inactivated: 02/06/2018 Target Resolution Date: 02/06/2018 Unmet Reason: pt Goal Status: Unmet noncompliant wth offloading Patient/caregiver will verbalize understanding of pressure ulcer management Date Initiated: 12/12/2017 Target Resolution Date: 07/23/2019 Goal Status: Active Interventions: Assess: immobility, friction, shearing, incontinence upon admission and as needed Assess offloading mechanisms upon admission and as needed Assess potential for pressure ulcer upon admission and as needed  Notes: Wound/Skin Impairment Nursing Diagnoses: Impaired tissue integrity Goals: Patient/caregiver will verbalize understanding of skin care regimen Date Initiated: 03/06/2018 Target Resolution Date: 07/16/2019 Goal Status: Active Ulcer/skin breakdown will heal within 14 weeks Date Initiated: 07/31/2017 Date Inactivated: 01/09/2018 Target Resolution Date: 11/30/2017 Unmet Goal Status: Unmet Reason: noncompliant with offloading Interventions: Assess patient/caregiver ability to perform ulcer/skin care regimen upon admission and as needed Notes: Electronic Signature(s) Signed: 06/25/2019 5:29:01 PM By: Baruch Gouty RN, BSN Entered By: Baruch Gouty on 06/25/2019 10:16:39 -------------------------------------------------------------------------------- Pain Assessment Details Patient Name: Date of Service: [name], [name] 06/25/2019 9:30 AM Medical Record [id number] Patient Account Number: [id number] Date of Birth/Sex: Treating RN: 11/24/1965 (53 y.o. [name] Primary Care Shelley Pooley: [name] Other Clinician: Sandre Kitty Referring Hawley Michel: Treating  Brendolyn Stockley/Extender:Stone III, Delray Alt, Fuller Plan in Treatment: 99 Active Problems Location of Pain Severity and Description of Pain Patient Has Paino Yes Site Locations Rate the pain. Current Pain Level: 8 Pain Management and Medication Current Pain Management: Electronic Signature(s) Signed: 06/25/2019 5:29:01 PM By: Baruch Gouty RN, BSN Signed: 09/09/2019 3:04:24 PM By: Sandre Kitty Entered By: Sandre Kitty on 06/25/2019 09:57:05 -------------------------------------------------------------------------------- Patient/Caregiver Education Details Patient Name: Date of Service: Dmitri, Tso 9/23/2020andnbsp9:30 AM Medical Record [id number] Patient Account Number: [id number] Date of Birth/Gender: 1965/10/21 (53 y.o. M) Treating RN: Baruch Gouty Primary Care Physician: [name] Other Clinician: Sandre Kitty Referring Physician: Treating Physician/Extender:Stone III, Delray Alt, Fuller Plan in Treatment: 50 Education Assessment Education Provided To: Patient Education Topics Provided Pressure: Methods: Explain/Verbal Responses: Reinforcements needed, State content correctly Wound/Skin Impairment: Methods: Explain/Verbal Responses: Reinforcements needed, State content correctly Electronic Signature(s) Signed: 06/25/2019 5:29:01 PM By: Baruch Gouty RN, BSN Entered By: Baruch Gouty on 06/25/2019 10:17:26 -------------------------------------------------------------------------------- Wound Assessment Details Patient Name: Date of Service: [name], [name] 06/25/2019 9:30 AM Medical Record [id number] Patient Account Number: [id number] Date of Birth/Sex: Treating RN: 17-Nov-1965 (53 y.o. [name] Primary Care Arley Salamone: [name] Other Clinician: Sandre Kitty Referring Marielena Harvell: Treating Kylor Valverde/Extender:Stone III, Delray Alt, Fuller Plan in Treatment: 99 Wound Status Wound Number: 17 Primary  Pressure Ulcer Etiology: Wound Location: Right Ischial Tuberosity Wound Open Wounding Event: Pressure Injury Status: Date Acquired: 10/02/2013 Comorbid Anemia, Hypertension, Colitis, Weeks Of Treatment: 99 History: Osteomyelitis, Paraplegia Clustered Wound: No Photos Wound Measurements Length: (cm) 0.3 Width: (cm) 0.5 Depth: (cm) 2.1 Area: (cm) 0.118 Volume: (cm) 0.247 % Reduction in Area: 99.5% % Reduction in Volume: 99.3% Epithelialization: Small (1-33%) Tunneling: No Undermining: Yes Starting Position (o'clock): 12 Ending Position (o'clock): 12 Maximum Distance: (cm) 3.5 Wound Description Classification: Category/Stage IV Wound Margin: Fibrotic scar, thickened scar Exudate Amount: Medium Exudate Type: Serosanguineous Exudate Color: red, brown Wound Bed Granulation Amount: Large (67-100%) Granulation Quality: Pink, Pale Necrotic Amount: Small (1-33%) Necrotic Quality: Adherent Slough Foul Odor After Cleansing: No Slough/Fibrino Yes Exposed Structure Fascia Exposed: No Fat Layer (Subcutaneous Tissue) Exposed: Yes Tendon Exposed: No Muscle Exposed: No Joint Exposed: No Bone Exposed: No Electronic Signature(s) Signed: 06/26/2019 6:12:41 PM By: Baruch Gouty RN, BSN Signed: 06/27/2019 8:42:43 AM By: Mikeal Hawthorne EMT/HBOT Previous Signature: 06/25/2019 5:29:01 PM Version By: Baruch Gouty RN, BSN Entered By: Mikeal Hawthorne on 06/26/2019 10:25:13 -------------------------------------------------------------------------------- Wound Assessment Details Patient Name: Date of Service: [name], [name] 06/25/2019 9:30 AM Medical Record [id number] Patient Account Number: [id number] Date of Birth/Sex: Treating RN: 14-Jun-1966 (53 y.o. [name] Primary Care Damarious Holtsclaw: [name] Other Clinician: Sandre Kitty Referring Roben Schliep: Treating Camryn Lampson/Extender:Stone III, Delray Alt, Fuller Plan in Treatment: 99 Wound Status Wound Number: 19  Primary Pressure Ulcer Etiology:  Wound Location: Sacrum Wound Open Wounding Event: Pressure Injury Status: Date Acquired: 10/02/2013 Comorbid Anemia, Hypertension, Colitis, Weeks Of Treatment: 99 History: Osteomyelitis, Paraplegia Clustered Wound: No Photos Wound Measurements Length: (cm) 1.2 Width: (cm) 0.5 Depth: (cm) 1.2 Area: (cm) 0.471 Volume: (cm) 0.565 % Reduction in Area: 77.9% % Reduction in Volume: 79.7% Epithelialization: Medium (34-66%) Tunneling: No Undermining: Yes Starting Position (o'clock): 12 Ending Position (o'clock): 12 Maximum Distance: (cm) 2.2 Wound Description Classification: Category/Stage IV Wound Margin: Fibrotic scar, thickened scar Exudate Amount: Medium Exudate Type: Serosanguineous Exudate Color: red, brown Wound Bed Granulation Amount: Large (67-100%) Granulation Quality: Pink Necrotic Amount: None Present (0%) Foul Odor After Cleansing: No Slough/Fibrino No Exposed Structure Fascia Exposed: No Fat Layer (Subcutaneous Tissue) Exposed: Yes Tendon Exposed: No Muscle Exposed: No Joint Exposed: No Bone Exposed: No Electronic Signature(s) Signed: 06/26/2019 6:12:41 PM By: Baruch Gouty RN, BSN Signed: 06/27/2019 8:42:43 AM By: Mikeal Hawthorne EMT/HBOT Previous Signature: 06/25/2019 5:29:01 PM Version By: Baruch Gouty RN, BSN Entered By: Mikeal Hawthorne on 06/26/2019 09:54:37 -------------------------------------------------------------------------------- Wound Assessment Details Patient Name: Date of Service: [name], [name] 06/25/2019 9:30 AM Medical Record [id number] Patient Account Number: [id number] Date of Birth/Sex: Treating RN: [birth year] (53 y.o. [name] Primary Care Camry Theiss: [name] Other Clinician: Sandre Kitty Referring Jadwiga Faidley: Treating Vraj Denardo/Extender:Stone III, Delray Alt, Fuller Plan in Treatment: 99 Wound Status Wound Number: 21 Primary Pressure Ulcer Etiology: Wound  Location: Right Ischium - Medial Wound Open Wounding Event: Gradually Appeared Status: Date Acquired: 10/02/2012 Comorbid Anemia, Hypertension, Colitis, Weeks Of Treatment: 64 History: Osteomyelitis, Paraplegia Clustered Wound: No Photos Wound Measurements Length: (cm) 0.8 Width: (cm) 0.4 Depth: (cm) 3 Area: (cm) 0.251 Volume: (cm) 0.754 Wound Description Classification: Category/Stage II Wound Margin: Well defined, not attached Exudate Amount: Small Exudate Type: Serosanguineous Exudate Color: red, brown Wound Bed Granulation Amount: Large (67-100%) Granulation Quality: Pink, Pale Necrotic Amount: None Present (0%) After Cleansing: No rino No Exposed Structure osed: No (Subcutaneous Tissue) Exposed: Yes osed: No osed: No sed: No ed: No % Reduction in Area: 91.1% % Reduction in Volume: 86.7% Epithelialization: Small (1-33%) Tunneling: No Undermining: No Foul Odor Slough/Fib Fascia Exp Fat Layer Tendon Exp Muscle Exp Joint Expo Bone Expos Electronic Signature(s) Signed: 06/26/2019 6:12:41 PM By: Baruch Gouty RN, BSN Signed: 06/27/2019 8:42:43 AM By: Mikeal Hawthorne EMT/HBOT Previous Signature: 06/25/2019 5:29:01 PM Version By: Baruch Gouty RN, BSN Entered By: Mikeal Hawthorne on 06/26/2019 10:52:37 -------------------------------------------------------------------------------- Wound Assessment Details Patient Name: Date of Service: [name], [name] 06/25/2019 9:30 AM Medical Record [id number] Patient Account Number: [id number] Date of Birth/Sex: Treating RN: 1966/07/31 (53 y.o. [name] Primary Care Sabriel Borromeo: Other Clinician: Henrene Hawking Referring Kemya Shed: Treating Shaily Librizzi/Extender:Stone III, Delray Alt, Fuller Plan in Treatment: 99 Wound Status Wound Number: 22 Primary Pressure Ulcer Etiology: Wound Location: Left Ischium Wound Open Wounding Event: Gradually Appeared Status: Date Acquired:  10/02/2013 Comorbid Anemia, Hypertension, Colitis, Weeks Of Treatment: 64 History: Osteomyelitis, Paraplegia Clustered Wound: No Photos Wound Measurements Length: (cm) 2 Width: (cm) 0.5 Depth: (cm) 2 Area: (cm) 0.785 Volume: (cm) 1.571 % Reduction in Area: -85.1% % Reduction in Volume: -32.2% Epithelialization: Small (1-33%) Tunneling: No Undermining: Yes Starting Position (o'clock): 12 Ending Position (o'clock): 12 Maximum Distance: (cm) 3.5 Wound Description Classification: Category/Stage II Wound Margin: Well defined, not attached Exudate Amount: Medium Exudate Type: Serosanguineous Exudate Color: red, brown Wound Bed Granulation Amount: Large (67-100%) Granulation Quality: Pink Necrotic Amount: None Present (0%) Foul Odor After Cleansing: No Slough/Fibrino No Exposed Structure Fascia Exposed: No Fat Layer (  Subcutaneous Tissue) Exposed: Yes Tendon Exposed: No Muscle Exposed: No Joint Exposed: No Bone Exposed: No Electronic Signature(s) Signed: 06/26/2019 6:12:41 PM By: Baruch Gouty RN, BSN Signed: 06/27/2019 8:42:43 AM By: Mikeal Hawthorne EMT/HBOT Previous Signature: 06/25/2019 5:29:01 PM Version By: Baruch Gouty RN, BSN Entered By: Mikeal Hawthorne on 06/26/2019 10:17:19 -------------------------------------------------------------------------------- Wound Assessment Details Patient Name: Date of Service: [name], [name] 06/25/2019 9:30 AM Medical Record [id number] Patient Account Number: [id number] Date of Birth/Sex: Treating RN: [birth year] (53 y.o. [name] Primary Care Derric Dealmeida: [name] Other Clinician: Sandre Kitty Referring Jarelly Rinck: Treating Tariq Pernell/Extender:Stone III, Delray Alt, Fuller Plan in Treatment: 99 Wound Status Wound Number: 23 Primary Pressure Ulcer Etiology: Wound Location: Right Gluteus Wound Open Wounding Event: Gradually Appeared Status: Date Acquired: 09/18/2018 Comorbid Anemia, Hypertension,  Colitis, Weeks Of Treatment: 37 History: Osteomyelitis, Paraplegia Clustered Wound: No Photos Wound Measurements Length: (cm) 0.3 % Reduction in Are Width: (cm) 2 % Reduction in Vol Depth: (cm) 1 Epithelialization: Area: (cm) 0.471 Tunneling: Volume: (cm) 0.471 Undermining: Wound Description Classification: Category/Stage II Foul Odor After C Wound Margin: Well defined, not attached Slough/Fibrino Exudate Amount: Small Exudate Type: Serosanguineous Exudate Color: red, brown Wound Bed Granulation Amount: Large (67-100%) Granulation Quality: Pink Fascia Exposed: Necrotic Amount: None Present (0%) Fat Layer (Subcut Tendon Exposed: Muscle Exposed: Joint Exposed: Bone Exposed: leansing: No No Exposed Structure No aneous Tissue) Exposed: Yes No No No No a: -36.1% ume: 51.3% Small (1-33%) No No Electronic Signature(s) Signed: 06/26/2019 6:12:41 PM By: Baruch Gouty RN, BSN Signed: 06/27/2019 8:42:43 AM By: Mikeal Hawthorne EMT/HBOT Previous Signature: 06/25/2019 5:29:01 PM Version By: Baruch Gouty RN, BSN Entered By: Mikeal Hawthorne on 06/26/2019 10:17:57 -------------------------------------------------------------------------------- Vitals Details Patient Name: Date of Service: P[place], Nichols 06/25/2019 9:30 AM Medical Record [id number] Patient Account Number: [id number] Date of Birth/Sex: Treating RN: [birth year] (53 y.o. [name] Primary Care Drew Herman: [name] Other Clinician: Sandre Kitty Referring Kentravious Lipford: Treating Laporsche Hoeger/Extender:Stone III, Delray Alt, Fuller Plan in Treatment: 99 Vital Signs Time Taken: 09:56 Temperature (F): 98.0 Height (in): 72 Pulse (bpm): 72 Weight (lbs): 215 Respiratory Rate (breaths/min): 18 Body Mass Index (BMI): 29.2 Blood Pressure (mmHg): 123/46 Reference Range: 80 - 120 mg / dl Electronic Signature(s) Signed: 09/09/2019 3:04:24 PM By: Sandre Kitty Entered By: Sandre Kitty  on 06/25/2019 09:56:58

## 2019-09-09 NOTE — Progress Notes (Signed)
[name] [name], [name] [name] ([id number]) Visit Report for 09/03/2019 Arrival Information Details Patient Name: Date of Service: [name] [name], [name] 09/03/2019 9:00 AM Medical Record [id number] Patient Account Number: [id number] Date of Birth/Sex: Treating RN: Jan 25, 1966 (53 y.o. Janyth Contes Primary Care Valma Rotenberg: [name] Other Clinician: Referring Roizy Harold: Treating Camrynn Mcclintic/Extender:Stone III, Delray Alt, Fuller Plan in Treatment: 109 Visit Information History Since Last Visit Added or deleted any medications: No Patient Arrived: Wheel Chair Any new allergies or adverse reactions: No Arrival Time: 09:16 Had a fall or experienced change in No activities of daily living that may affect Accompanied By: alone risk of falls: Transfer Assistance: None Signs or symptoms of abuse/neglect since last No Patient Identification Verified: Yes visito Secondary Verification Process Yes Hospitalized since last visit: No Completed: Implantable device outside of the clinic excluding No Patient Requires Transmission-Based No cellular tissue based products placed in the center Precautions: since last visit: Patient Has Alerts: No Has Dressing in Place as Prescribed: Yes Pain Present Now: Yes Electronic Signature(s) Signed: 09/08/2019 5:51:44 PM By: Levan Hurst RN, BSN Entered By: Levan Hurst on 09/03/2019 09:17:13 -------------------------------------------------------------------------------- Clinic Level of Care Assessment Details Patient Name: Date of Service: [name] [name], [name] [name] 09/03/2019 9:00 AM Medical Record [id number] Patient Account Number: [id number] Date of Birth/Sex: Treating RN: 1966-06-20 (53 y.o. [name] Primary Care Olufemi Mofield: [name] Other Clinician: Referring Myalynn Lingle: Treating Raffaela Ladley/Extender:Stone III, Delray Alt, Fuller Plan in Treatment: 109 Clinic Level of Care Assessment Items TOOL 4 Quantity Score []  - Use when only  an EandM is performed on FOLLOW-UP visit 0 ASSESSMENTS - Nursing Assessment / Reassessment X - Reassessment of Co-morbidities (includes updates in patient status) 1 10 X - Reassessment of Adherence to Treatment Plan 1 5 ASSESSMENTS - Wound and Skin Assessment / Reassessment []  - Simple Wound Assessment / Reassessment - one wound 0 X - Complex Wound Assessment / Reassessment - multiple wounds 6 5 []  - Dermatologic / Skin Assessment (not related to wound area) 0 ASSESSMENTS - Focused Assessment []  - Circumferential Edema Measurements - multi extremities 0 []  - Nutritional Assessment / Counseling / Intervention 0 []  - Lower Extremity Assessment (monofilament, tuning fork, pulses) 0 []  - Peripheral Arterial Disease Assessment (using hand held doppler) 0 ASSESSMENTS - Ostomy and/or Continence Assessment and Care []  - Incontinence Assessment and Management 0 []  - Ostomy Care Assessment and Management (repouching, etc.) 0 PROCESS - Coordination of Care X - Simple Patient / Family Education for ongoing care 1 15 []  - Complex (extensive) Patient / Family Education for ongoing care 0 X - Staff obtains Programmer, systems, Records, Test Results / Process Orders 1 10 []  - Staff telephones HHA, Nursing Homes / Clarify orders / etc 0 []  - Routine Transfer to another Facility (non-emergent condition) 0 []  - Routine Hospital Admission (non-emergent condition) 0 []  - New Admissions / Biomedical engineer / Ordering NPWT, Apligraf, etc. 0 []  - Emergency Hospital Admission (emergent condition) 0 X - Simple Discharge Coordination 1 10 []  - Complex (extensive) Discharge Coordination 0 PROCESS - Special Needs []  - Pediatric / Minor Patient Management 0 []  - Isolation Patient Management 0 []  - Hearing / Language / Visual special needs 0 []  - Assessment of Community assistance (transportation, D/C planning, etc.) 0 []  - Additional assistance / Altered mentation 0 []  - Support Surface(s) Assessment (bed, cushion,  seat, etc.) 0 INTERVENTIONS - Wound Cleansing / Measurement []  - Simple Wound Cleansing - one wound 0 X - Complex Wound Cleansing - multiple wounds 6 5 X - Wound Imaging (photographs -  any number of wounds) 1 5 []  - Wound Tracing (instead of photographs) 0 []  - Simple Wound Measurement - one wound 0 X - Complex Wound Measurement - multiple wounds 6 5 INTERVENTIONS - Wound Dressings X - Small Wound Dressing one or multiple wounds 6 10 []  - Medium Wound Dressing one or multiple wounds 0 []  - Large Wound Dressing one or multiple wounds 0 X - Application of Medications - topical 1 5 []  - Application of Medications - injection 0 INTERVENTIONS - Miscellaneous []  - External ear exam 0 []  - Specimen Collection (cultures, biopsies, blood, body fluids, etc.) 0 []  - Specimen(s) / Culture(s) sent or taken to Lab for analysis 0 []  - Patient Transfer (multiple staff / Civil Service fast streamer / Similar devices) 0 []  - Simple Staple / Suture removal (25 or less) 0 []  - Complex Staple / Suture removal (26 or more) 0 []  - Hypo / Hyperglycemic Management (close monitor of Blood Glucose) 0 []  - Ankle / Brachial Index (ABI) - do not check if billed separately 0 X - Vital Signs 1 5 Has the patient been seen at the hospital within the last three years: Yes Total Score: 215 Level Of Care: New/Established - Level 5 Electronic Signature(s) Signed: 09/03/2019 6:22:02 PM By: Baruch Gouty RN, BSN Entered By: Baruch Gouty on 09/03/2019 09:54:01 -------------------------------------------------------------------------------- Encounter Discharge Information Details Patient Name: Date of Service: [name] [name], [name] [name] 09/03/2019 9:00 AM Medical Record [id number] Patient Account Number: [id number] Date of Birth/Sex: Treating RN: Jan 01, 1966 (53 y.o. [name] Primary Care Enriqueta Augusta: [name] Other Clinician: Referring Romell Wolden: Treating Joniel Graumann/Extender:Stone III, Delray Alt, Fuller Plan in  Treatment: 109 Encounter Discharge Information Items Discharge Condition: Stable Ambulatory Status: Wheelchair Discharge Destination: Home Transportation: Private Auto Accompanied By: self Schedule Follow-up Appointment: Yes Clinical Summary of Care: Patient Declined Electronic Signature(s) Signed: 09/09/2019 2:52:36 PM By: Carlene Coria RN Entered By: Carlene Coria on 09/03/2019 14:11:26 -------------------------------------------------------------------------------- Ray Details Patient Name: Date of Service: [name] [name], [name] [name] 09/03/2019 9:00 AM Medical Record [id number] Patient Account Number: [id number] Date of Birth/Sex: Treating RN: January 14, 1966 (53 y.o. [name] Primary Care Reshma Hoey: [name] Other Clinician: Referring Daeton Kluth: Treating Joud Pettinato/Extender:Stone III, Delray Alt, Fuller Plan in Treatment: 109 Active Inactive Pressure Nursing Diagnoses: Knowledge deficit related to causes and risk factors for pressure ulcer development Knowledge deficit related to management of pressures ulcers Potential for impaired tissue integrity related to pressure, friction, moisture, and shear Goals: Patient will remain free from development of additional pressure ulcers Date Inactivated: 08/14/2018 Target11/20/2019 Resolution Date Initiated: 12/12/2017 Date: Goal Status: Unmet Unmet Reason: hospitalized Patient will remain free of pressure ulcers Date Initiated: 12/12/2017 Date Inactivated: 02/06/2018 Target Resolution Date: 02/06/2018 Unmet Reason: pt Goal Status: Unmet noncompliant wth offloading Patient/caregiver will verbalize understanding of pressure ulcer management Date Initiated: 12/12/2017 Target Resolution Date: 10/01/2019 Goal Status: Active Interventions: Assess: immobility, friction, shearing, incontinence upon admission and as needed Assess offloading mechanisms upon admission and as needed Assess potential for  pressure ulcer upon admission and as needed Notes: Wound/Skin Impairment Nursing Diagnoses: Impaired tissue integrity Goals: Patient/caregiver will verbalize understanding of skin care regimen Date Initiated: 03/06/2018 Target Resolution Date: 10/01/2019 Goal Status: Active Ulcer/skin breakdown will heal within 14 weeks Date Initiated: 07/31/2017 Date Inactivated: 01/09/2018 Target Resolution Date: 11/30/2017 Unmet Goal Status: Unmet Reason: noncompliant with offloading Interventions: Assess patient/caregiver ability to perform ulcer/skin care regimen upon admission and as needed Notes: Electronic Signature(s) Signed: 09/03/2019 6:22:02 PM By: Baruch Gouty RN, BSN Entered By: Baruch Gouty on 09/03/2019  09:52:46 -------------------------------------------------------------------------------- Pain Assessment Details Patient Name: Date of Service: [name], [name] [name] 09/03/2019 9:00 AM Medical Record [id number] Patient Account Number: [id number] Date of Birth/Sex: Treating RN: 07-23-1966 (53 y.o. Janyth Contes Primary Care Jushua Waltman: [name] Other Clinician: Referring Taesean Reth: Treating Ghada Abbett/Extender:Stone III, Delray Alt, Fuller Plan in Treatment: 109 Active Problems Location of Pain Severity and Description of Pain Patient Has Paino Yes Site Locations Site Locations Pain Location: Pain in Ulcers With Dressing Change: Yes Duration of the Pain. Constant / Intermittento Constant Rate the pain. Current Pain Level: 8 Worst Pain Level: 10 Least Pain Level: 4 Character of Pain Describe the Pain: Throbbing Pain Management and Medication Current Pain Management: Medication: Yes Cold Application: No Rest: No Massage: No Activity: No T.E.N.S.: No Heat Application: No Leg drop or elevation: No Is the Current Pain Management Adequate: Adequate How does your wound impact your activities of daily livingo Sleep: No Bathing: No Appetite:  No Relationship With Others: No Bladder Continence: No Emotions: No Bowel Continence: No Work: No Toileting: No Drive: No Dressing: No Hobbies: No Electronic Signature(s) Signed: 09/08/2019 5:51:44 PM By: Levan Hurst RN, BSN Entered By: Levan Hurst on 09/03/2019 09:17:43 -------------------------------------------------------------------------------- Patient/Caregiver Education Details Patient Name: Date of Service: Hayden Pedro 12/2/2020andnbsp9:00 AM Medical Record [id number] Patient Account Number: [id number] Date of Birth/Gender: Treating RN: 05-Sep-1966 (53 y.o. [name] Primary Care Physician: [name] Other Clinician: Referring Physician: Treating Physician/Extender:Stone III, Delray Alt, Fuller Plan in Treatment: 109 Education Assessment Education Provided To: Patient Education Topics Provided Pressure: Methods: Explain/Verbal Responses: Reinforcements needed, State content correctly Wound/Skin Impairment: Methods: Explain/Verbal Responses: Reinforcements needed, State content correctly Electronic Signature(s) Signed: 09/03/2019 6:22:02 PM By: Baruch Gouty RN, BSN Entered By: Baruch Gouty on 09/03/2019 09:53:11 -------------------------------------------------------------------------------- Wound Assessment Details Patient Name: Date of Service: [name] [name], [name] [name] 09/03/2019 9:00 AM Medical Record [id number] Patient Account Number: [id number] Date of Birth/Sex: Treating RN: Nov 05, 1965 (53 y.o. Janyth Contes Primary Care Leelynd Maldonado: [name] Other Clinician: Referring Niza Soderholm: Treating Cadarius Nevares/Extender:Stone III, Delray Alt, Fuller Plan in Treatment: 109 Wound Status Wound Number: 17 Primary Pressure Ulcer Etiology: Wound Location: Right Ischial Tuberosity Wound Open Wounding Event: Pressure Injury Status: Date Acquired: 10/02/2013 Comorbid Anemia, Hypertension, Colitis, Weeks Of Treatment:  109 History: Osteomyelitis, Paraplegia Clustered Wound: No Photos Wound Measurements Length: (cm) 1 Width: (cm) 1.6 Depth: (cm) 1.2 Area: (cm) 1.257 Volume: (cm) 1.508 Wound Description Classification: Category/Stage IV Wound Margin: Fibrotic scar, thickened scar Exudate Amount: Medium Exudate Type: Serosanguineous Exudate Color: red, brown Wound Bed Granulation Amount: Large (67-100%) Granulation Quality: Pink, Pale Necrotic Amount: None Present (0%) Foul Odor After Cleansing: N Slough/Fibrino Y Exposed Structure Fascia Exposed: No Fat Layer (Subcutaneous Tissue) Exposed: Ye Tendon Exposed: No Muscle Exposed: No Joint Exposed: No Bone Exposed: No % Reduction in Area: 94.4% % Reduction in Volume: 95.8% Epithelialization: Small (1-33%) Tunneling: No Undermining: No o es s Treatment Notes Wound #17 (Right Ischial Tuberosity) 1. Cleanse With Wound Cleanser 3. Primary Dressing Applied Calcium Alginate Ag 4. Secondary Dressing ABD Pad Dry Gauze 5. Secured With Recruitment consultant) Signed: 09/04/2019 3:43:57 PM By: Mikeal Hawthorne EMT/HBOT Signed: 09/08/2019 5:51:44 PM By: Levan Hurst RN, BSN Entered By: Mikeal Hawthorne on 09/04/2019 13:53:44 -------------------------------------------------------------------------------- Wound Assessment Details Patient Name: Date of Service: [name] [name], [name] [name] 09/03/2019 9:00 AM Medical Record [id number] Patient Account Number: [id number] Date of Birth/Sex: Treating RN: [birth year] (53 y.o. Janyth Contes Primary Care Rodell Marrs: [name] Other Clinician: Referring Chery Giusto: Treating Destry Bezdek/Extender:Stone III, Delray Alt, Dwight Weeks in Treatment: 109 Wound Status  Wound Number: 19 Primary Pressure Ulcer Etiology: Wound Location: Sacrum Wound Open Wounding Event: Pressure Injury Status: Date Acquired: 10/02/2013 Comorbid Anemia, Hypertension, Colitis, Weeks Of Treatment: 109 History:  Osteomyelitis, Paraplegia Clustered Wound: No Photos Wound Measurements Length: (cm) 0.9 Width: (cm) 0.8 Depth: (cm) 1.4 Area: (cm) 0.565 Volume: (cm) 0.792 Wound Description Classification: Category/Stage IV Wound Margin: Fibrotic scar, thickened scar Exudate Amount: Medium Exudate Type: Serosanguineous Exudate Color: red, brown Wound Bed Granulation Amount: Large (67-100%) Granulation Quality: Red Necrotic Amount: None Present (0%) fter Cleansing: No ino No Exposed Structure ed: No ubcutaneous Tissue) Exposed: Yes ed: No ed: No d: No : No % Reduction in Area: 73.5% % Reduction in Volume: 71.5% Epithelialization: Medium (34-66%) Tunneling: No Undermining: No Foul Odor A Slough/Fibr Fascia Expos Fat Layer (S Tendon Expos Muscle Expos Joint Expose Bone Exposed Treatment Notes Wound #19 (Sacrum) 1. Cleanse With Wound Cleanser 3. Primary Dressing Applied Calcium Alginate Ag 4. Secondary Dressing ABD Pad Dry Gauze 5. Secured With Recruitment consultant) Signed: 09/04/2019 3:43:57 PM By: Mikeal Hawthorne EMT/HBOT Signed: 09/08/2019 5:51:44 PM By: Levan Hurst RN, BSN Entered By: Mikeal Hawthorne on 09/04/2019 13:55:51 -------------------------------------------------------------------------------- Wound Assessment Details Patient Name: Date of Service: [name], [name] [name] 09/03/2019 9:00 AM Medical Record [id number] Patient Account Number: [id number] Date of Birth/Sex: Treating RN: 29-Oct-1965 (54 y.o. Janyth Contes Primary Care Ashiyah Pavlak: [name] Other Clinician: Referring Donnalynn Wheeless: Treating Zoee Heeney/Extender:Stone III, Delray Alt, Fuller Plan in Treatment: 109 Wound Status Wound Number: 21 Primary Pressure Ulcer Etiology: Wound Location: Right Ischium - Medial Wound Open Wounding Event: Gradually Appeared Status: Date Acquired: 10/02/2012 Comorbid Anemia, Hypertension, Colitis, Weeks Of Treatment: 74 History: Osteomyelitis,  Paraplegia Clustered Wound: No Photos Wound Measurements Length: (cm) 0.2 % Reduction in Are Width: (cm) 0.3 % Reduction in Vol Depth: (cm) 1.5 Epithelialization: Area: (cm) 0.047 Tunneling: Volume: (cm) 0.071 Undermining: Starting Positi Ending Position Maximum Distanc a: 98.3% ume: 98.7% Small (1-33%) No Yes on (o'clock): 12 (o'clock): 12 e: (cm) 4.2 Wound Description Classification: Category/Stage II Foul Odor After C Wound Margin: Well defined, not attached Slough/Fibrino Exudate Amount: Small Exudate Type: Serosanguineous Exudate Color: red, brown Wound Bed Granulation Amount: Large (67-100%) Granulation Quality: Pink Fascia Exposed: Necrotic Amount: None Present (0%) Fat Layer (Subcut Tendon Exposed: Muscle Exposed: Joint Exposed: Bone Exposed: leansing: No No Exposed Structure No aneous Tissue) Exposed: Yes No No No No Treatment Notes Wound #21 (Right, Medial Ischium) 1. Cleanse With Wound Cleanser 3. Primary Dressing Applied Calcium Alginate Ag 4. Secondary Dressing ABD Pad Dry Gauze 5. Secured With Recruitment consultant) Signed: 09/04/2019 3:43:57 PM By: Mikeal Hawthorne EMT/HBOT Signed: 09/08/2019 5:51:44 PM By: Levan Hurst RN, BSN Entered By: Mikeal Hawthorne on 09/04/2019 13:54:08 -------------------------------------------------------------------------------- Wound Assessment Details Patient Name: Date of Service: [name] [name], [name] [name] 09/03/2019 9:00 AM Medical Record [id number] Patient Account Number: [id number] Date of Birth/Sex: Treating RN: Apr 26, 1966 (53 y.o. Janyth Contes Primary Care Laquinta Hazell: [name] Other Clinician: Referring Sabriah Hobbins: Treating Allesha Aronoff/Extender:Stone III, Delray Alt, Fuller Plan in Treatment: 109 Wound Status Wound Number: 22 Primary Pressure Ulcer Etiology: Wound Location: Left Ischium Wound Open Wounding Event: Gradually Appeared Status: Date Acquired: 10/02/2013 Comorbid  Anemia, Hypertension, Colitis, Weeks Of Treatment: 74 History: Osteomyelitis, Paraplegia Clustered Wound: No Photos Wound Measurements Length: (cm) 2.5 % Reduction i Width: (cm) 0.6 % Reduction i Depth: (cm) 3.2 Epithelializa Area: (cm) 1.178 Tunneling: Volume: (cm) 3.77 Undermining: Starting P Ending Pos Maximum Di n Area: -177.8% n Volume: -217.3% tion: Small (1-33%) No Yes osition (o'clock): 12 ition (o'clock):  12 stance: (cm) 4.4 Wound Description Classification: Category/Stage II Wound Margin: Well defined, not attached Exudate Amount: Medium Exudate Type: Serosanguineous Exudate Color: red, brown Wound Bed Granulation Amount: Large (67-100%) Granulation Quality: Pink Necrotic Amount: None Present (0%) Foul Odor After Cleansing: No Slough/Fibrino No Exposed Structure Fascia Exposed: No Fat Layer (Subcutaneous Tissue) Exposed: Yes Tendon Exposed: No Muscle Exposed: No Joint Exposed: No Bone Exposed: No Treatment Notes Wound #22 (Left Ischium) 1. Cleanse With Wound Cleanser 3. Primary Dressing Applied Calcium Alginate Ag 4. Secondary Dressing ABD Pad Dry Gauze 5. Secured With Recruitment consultant) Signed: 09/04/2019 3:43:57 PM By: Mikeal Hawthorne EMT/HBOT Signed: 09/08/2019 5:51:44 PM By: Levan Hurst RN, BSN Entered By: Mikeal Hawthorne on 09/04/2019 13:54:55 -------------------------------------------------------------------------------- Wound Assessment Details Patient Name: Date of Service: [name], [name] [name] 09/03/2019 9:00 AM Medical Record [id number] Patient Account Number: [id number] Date of Birth/Sex: Treating RN: [birth year] (53 y.o. Janyth Contes Primary Care Joshaua Epple: [name] Other Clinician: Referring Peggye Poon: Treating Jovita Persing/Extender:Stone III, Delray Alt, Fuller Plan in Treatment: 109 Wound Status Wound Number: 23 Primary Pressure Ulcer Etiology: Wound Location: Right Gluteus Wound Open Wounding  Event: Gradually Appeared Status: Date Acquired: 09/18/2018 Comorbid Anemia, Hypertension, Colitis, Weeks Of Treatment: 47 History: Osteomyelitis, Paraplegia Clustered Wound: No Photos Wound Measurements Length: (cm) 0.5 Width: (cm) 0.5 Depth: (cm) 1 Area: (cm) 0.196 Volume: (cm) 0.196 Wound Description Classification: Category/Stage II Wound Margin: Well defined, not attached Exudate Amount: Medium Exudate Type: Serosanguineous Exudate Color: red, brown Wound Bed Granulation Amount: Large (67-100%) Granulation Quality: Pink Necrotic Amount: None Present (0%) ter Cleansing: No no No Exposed Structure d: No bcutaneous Tissue) Exposed: Yes d: No d: No : No No % Reduction in Area: 43.4% % Reduction in Volume: 79.8% Epithelialization: Small (1-33%) Tunneling: No Undermining: No Foul Odor Af Slough/Fibri Fascia Expose Fat Layer (Su Tendon Expose Muscle Expose Joint Exposed Bone Exposed: Treatment Notes Wound #23 (Right Gluteus) 1. Cleanse With Wound Cleanser 3. Primary Dressing Applied Calcium Alginate Ag 4. Secondary Dressing ABD Pad Dry Gauze 5. Secured With Recruitment consultant) Signed: 09/04/2019 3:43:57 PM By: Mikeal Hawthorne EMT/HBOT Signed: 09/08/2019 5:51:44 PM By: Levan Hurst RN, BSN Entered By: Mikeal Hawthorne on 09/04/2019 13:54:32 -------------------------------------------------------------------------------- Wound Assessment Details Patient Name: Date of Service: [name] [name], [name] [name] 09/03/2019 9:00 AM Medical Record [id number] Patient Account Number: [id number] Date of Birth/Sex: Treating RN: [birth year] (53 y.o. Janyth Contes Primary Care Valisa Karpel: [name] Other Clinician: Referring Vora Clover: Treating Drianna Chandran/Extender:Stone III, Delray Alt, Fuller Plan in Treatment: 109 Wound Status Wound Number: 24 Primary Pressure Ulcer Etiology: Wound Location: Right Scrotum Wound Open Wounding Event: Gradually  Appeared Status: Date Acquired: 08/06/2019 Comorbid Anemia, Hypertension, Colitis, Weeks Of Treatment: 4 History: Osteomyelitis, Paraplegia Clustered Wound: No Photos Wound Measurements Length: (cm) 0.1 % Reduction in Area Width: (cm) 0.1 % Reduction in Volu Depth: (cm) 0.1 Epithelialization: Area: (cm) 0.008 Tunneling: Volume: (cm) 0.001 Undermining: Wound Description Classification: Category/Stage III Foul Odor After Cl Wound Margin: Flat and Intact Slough/Fibrino Exudate Amount: Small Exudate Type: Serosanguineous Exudate Color: red, brown Wound Bed Granulation Amount: Large (67-100%) E Granulation Quality: Red Fascia Exposed: Necrotic Amount: None Present (0%) Fat Layer (Subcuta Tendon Exposed: Muscle Exposed: Joint Exposed: Bone Exposed: eansing: No Yes xposed Structure No neous Tissue) Exposed: Yes No No No No : 99.7% me: 99.6% Large (67-100%) No No Treatment Notes Wound #24 (Right Scrotum) 1. Cleanse With Wound Cleanser 3. Primary Dressing Applied Calcium Alginate Ag 4. Secondary Dressing ABD Pad Dry Gauze 5. Secured With Recruitment consultant) Signed:  09/04/2019 3:43:57 PM By: Mikeal Hawthorne EMT/HBOT Signed: 09/08/2019 5:51:44 PM By: Levan Hurst RN, BSN Entered By: Mikeal Hawthorne on 09/04/2019 13:55:26 -------------------------------------------------------------------------------- Vitals Details Patient Name: Date of Service: [name] [name], [name] [name] 09/03/2019 9:00 AM Medical Record [id number] Patient Account Number: [id number] Date of Birth/Sex: Treating RN: 02-28-1966 (53 y.o. Janyth Contes Primary Care Stephone Gum: [name] Other Clinician: Referring Taqwa Deem: Treating Annaliese Saez/Extender:Stone III, Delray Alt, Fuller Plan in Treatment: 109 Vital Signs Time Taken: 09:20 Temperature (F): 98.7 Height (in): 72 Pulse (bpm): 81 Weight (lbs): 215 Respiratory Rate (breaths/min): 20 Body Mass Index (BMI):  29.2 Blood Pressure (mmHg): 112/59 Reference Range: 80 - 120 mg / dl Electronic Signature(s) Signed: 09/08/2019 5:51:44 PM By: Levan Hurst RN, BSN Entered By: Levan Hurst on 09/03/2019 09:20:34

## 2019-10-08 ENCOUNTER — Other Ambulatory Visit: Payer: Self-pay

## 2019-10-08 ENCOUNTER — Encounter (HOSPITAL_BASED_OUTPATIENT_CLINIC_OR_DEPARTMENT_OTHER): Payer: Medicare Other | Attending: Physician Assistant | Admitting: Physician Assistant

## 2019-10-08 DIAGNOSIS — K529 Noninfective gastroenteritis and colitis, unspecified: Secondary | ICD-10-CM | POA: Diagnosis not present

## 2019-10-08 DIAGNOSIS — G8222 Paraplegia, incomplete: Secondary | ICD-10-CM | POA: Insufficient documentation

## 2019-10-08 DIAGNOSIS — L89314 Pressure ulcer of right buttock, stage 4: Secondary | ICD-10-CM | POA: Insufficient documentation

## 2019-10-08 DIAGNOSIS — L89154 Pressure ulcer of sacral region, stage 4: Secondary | ICD-10-CM | POA: Diagnosis not present

## 2019-10-08 DIAGNOSIS — F17218 Nicotine dependence, cigarettes, with other nicotine-induced disorders: Secondary | ICD-10-CM | POA: Diagnosis not present

## 2019-10-08 DIAGNOSIS — L89893 Pressure ulcer of other site, stage 3: Secondary | ICD-10-CM | POA: Insufficient documentation

## 2019-10-08 DIAGNOSIS — D649 Anemia, unspecified: Secondary | ICD-10-CM | POA: Insufficient documentation

## 2019-10-08 DIAGNOSIS — N5089 Other specified disorders of the male genital organs: Secondary | ICD-10-CM | POA: Diagnosis not present

## 2019-10-08 DIAGNOSIS — Z9119 Patient's noncompliance with other medical treatment and regimen: Secondary | ICD-10-CM | POA: Insufficient documentation

## 2019-10-08 DIAGNOSIS — L89892 Pressure ulcer of other site, stage 2: Secondary | ICD-10-CM | POA: Insufficient documentation

## 2019-10-08 DIAGNOSIS — E46 Unspecified protein-calorie malnutrition: Secondary | ICD-10-CM | POA: Diagnosis not present

## 2019-10-08 DIAGNOSIS — L89324 Pressure ulcer of left buttock, stage 4: Secondary | ICD-10-CM | POA: Diagnosis not present

## 2019-10-08 DIAGNOSIS — I10 Essential (primary) hypertension: Secondary | ICD-10-CM | POA: Insufficient documentation

## 2019-10-08 DIAGNOSIS — Z933 Colostomy status: Secondary | ICD-10-CM | POA: Insufficient documentation

## 2019-10-08 DIAGNOSIS — K219 Gastro-esophageal reflux disease without esophagitis: Secondary | ICD-10-CM | POA: Insufficient documentation

## 2019-10-08 DIAGNOSIS — G894 Chronic pain syndrome: Secondary | ICD-10-CM | POA: Insufficient documentation

## 2019-10-08 DIAGNOSIS — Z6829 Body mass index (BMI) 29.0-29.9, adult: Secondary | ICD-10-CM | POA: Diagnosis not present

## 2019-10-08 NOTE — Progress Notes (Addendum)
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 dependence, cigarettes, with other nicotine-induced  disorders Procedures Wound #17 Pre-procedure diagnosis of Wound #17 is a Pressure Ulcer located on the Right Ischial Tuberosity . There was a Excisional Skin/Subcutaneous Tissue Debridement with a total area of 1 sq cm performed by [name] Keeler, PA. With the following instrument(s): Forceps, and Scissors to remove Viable and Non-Viable tissue/material. Material removed includes Subcutaneous Tissue and Slough and. No specimens were taken. A time out was conducted at 10:35, prior to the start of the procedure. There was no bleeding. The procedure was tolerated well with a pain level of Insensate throughout and a pain level of Insensate following the procedure. Post Debridement Measurements: 2.5cm length x 2.5cm width x 1.5cm depth; 7.363cm^3 volume. Post debridement Stage noted as Category/Stage IV. Character of Wound/Ulcer Post Debridement is improved. Post procedure Diagnosis Wound #17: Same as Pre-Procedure Wound #26 Pre-procedure diagnosis of Wound #26 is a Pressure Ulcer located on the Left,Dorsal Foot . There was a Excisional Skin/Subcutaneous Tissue Debridement with a total area of 2.4 sq cm performed by [name] Keeler, PA. With the following instrument(s): Curette to remove Viable and Non-Viable tissue/material. Material removed includes Subcutaneous Tissue and Slough and. No specimens were taken. A time out was conducted at 10:35, prior to the start of the procedure. A Minimum amount of bleeding was controlled with Pressure. The procedure was tolerated well with a pain level of Insensate throughout and a pain level of Insensate following the procedure. Post Debridement Measurements: 0.6cm length x 6cm width x 0.2cm depth; 0.565cm^3 volume. Post debridement Stage noted as Category/Stage III. Character of Wound/Ulcer Post Debridement is improved. Post procedure Diagnosis Wound #26: Same as Pre-Procedure Plan Follow-up Appointments: Return Appointment in: - 1 month Dressing Change  Frequency: Change dressing every day. - all wounds except feet Wound #25 Right,Dorsal Foot: Change Dressing every other day. Wound #26 Left,Dorsal Foot: Change Dressing every other day. Wound Cleansing: Clean wound with Normal Saline. May shower and wash wound with soap and water. - on days that dressing is changed Primary Wound Dressing: Wound #17 Right Ischial Tuberosity: Calcium Alginate with Silver - pack lightly into wounds Wound #19 Sacrum: Calcium Alginate with Silver - pack lightly into wounds Wound #21 Right,Medial Ischium: Calcium Alginate with Silver - pack lightly into wounds Wound #22 Left Ischium: Calcium Alginate with  Silver - pack lightly into wounds Wound #23 Right Gluteus: Calcium Alginate with Silver Wound #24 Right Scrotum: Calcium Alginate with Silver Wound #25 Right,Dorsal Foot: Silver Collagen - moisten with saline Wound #26 Left,Dorsal Foot: Silver Collagen - moisten with saline Secondary Dressing: Wound #17 Right Ischial Tuberosity: ABD pad Wound #19 Sacrum: ABD pad Wound #21 Right,Medial Ischium: ABD pad Wound #22 Left Ischium: ABD pad Wound #23 Right Gluteus: ABD pad Wound #24 Right Scrotum: Dry Gauze Wound #25 Right,Dorsal Foot: Kerlix/Rolled Gauze - or secure with tape Dry Gauze Wound #26 Left,Dorsal Foot: Kerlix/Rolled Gauze - or secure with tape Dry Gauze Edema Control: Elevate legs to the level of the heart or above for 30 minutes daily and/or when sitting, a frequency of: - throught the day Off-Loading: Turn and reposition every 2 hours Other: - up in chair no more than 2 hour increments 1. I would recommend that we continue with the silver alginate dressing to all wounds except for the dorsal foot ulcers well we will use silver collagen. The patient is in agreement with that plan. 2. I would recommend as well that we go ahead and continue to cover the areas with ABD pads at all locations we can do so on the dorsal feet as well  since he is not really wearing the compression stockings anymore he states he is not risking any more injuries to his legs. 3. The patient needs to continue to be very cautious about keeping his wounds offloaded obviously I think this is of utmost importance in general. The more he can keep pressure off the better he will do. We will see patient back for reevaluation in 1 month here in the clinic. If anything worsens or changes patient will contact our office for additional recommendations. Electronic Signature(s) Signed: 10/08/2019 10:48:27 AM By: [name] Keeler PA-C Entered By: [name] Keeler on 10/08/2019 10:48:27 -------------------------------------------------------------------------------- SuperBill Details Patient Name: Date of Service: ARAEL, KOLTERMAN 10/08/2019 Medical Record [id number] Patient Account Number: [id number] Date of Birth/Sex: Treating RN: 1966-01-05 (54 y.o. 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 Nicotine dependence, cigarettes, with other nicotine-induced disorders Facility Procedures CPT4 Code: [id number] Description: [id number] - DEB SUBQ TISSUE 20 SQ CM/< ICD-10 Diagnosis Description L89.893 Pressure ulcer of other site, stage 3 L89.324 Pressure ulcer of left buttock, stage 4 Modifier: Quantity: 1 Physician Procedures CPT4 Code: [id number] Description: 11042 - WC PHYS SUBQ TISS 20 SQ CM ICD-10 Diagnosis Description L89.893 Pressure ulcer of other site, stage 3 L89.324 Pressure ulcer of left buttock,  stage 4 Modifier: Quantity: 1 Electronic Signature(s) Signed: 10/08/2019 10:48:43 AM By: [name] Keeler PA-C Entered By: [name] Keeler on 10/08/2019 10:48:42

## 2019-10-08 NOTE — Progress Notes (Addendum)
[name], [name] ([id number]) Visit Report for 10/08/2019 Arrival Information Details Patient Name: Date of Service: [name], [name] 10/08/2019 9:00 AM Medical Record [id number] Patient Account Number: [id number] Date of Birth/Sex: Treating RN: Oct 06, 1965 (54 y.o. Lorette Ang, Meta.Reding Primary Care Frankee Gritz: [name] Other Clinician: Referring Rabecka Brendel: Treating Bodhi Stenglein/Extender:Stone III, Delray Alt, Fuller Plan in Treatment: 75 Visit Information History Since Last Visit Added or deleted any medications: No Patient Arrived: Wheel Chair Any new allergies or adverse reactions: No Arrival Time: 09:27 Had a fall or experienced change in No activities of daily living that may affect Accompanied By: self risk of falls: Transfer Assistance: None Signs or symptoms of abuse/neglect since last No Patient Identification Verified: Yes visito Secondary Verification Process Yes Hospitalized since last visit: No Completed: Implantable device outside of the clinic excluding No Patient Requires Transmission-Based No cellular tissue based products placed in the center Precautions: since last visit: Patient Has Alerts: No Has Dressing in Place as Prescribed: Yes Pain Present Now: No Electronic Signature(s) Signed: 10/08/2019 5:41:11 PM By: Deon Pilling Entered By: Deon Pilling on 10/08/2019 09:31:06 -------------------------------------------------------------------------------- Encounter Discharge Information Details Patient Name: Date of Service: [name], [name] 10/08/2019 9:00 AM Medical Record [id number] Patient Account Number: [id number] Date of Birth/Sex: Treating RN: 10-27-1965 (53 y.o. [name] Primary Care Temisha Murley: [name] Other Clinician: Referring Paizley Ramella: Treating Shykeria Sakamoto/Extender:Stone III, Delray Alt, Dwight Weeks in Treatment: 114 Encounter Discharge Information Items Post Procedure Vitals Discharge Condition: Stable Temperature  (F): 98.4 Ambulatory Status: Wheelchair Pulse (bpm): 75 Discharge Destination: Home Respiratory Rate (breaths/min): 20 Transportation: Private Auto Blood Pressure (mmHg): 126/58 Accompanied By: self Schedule Follow-up Appointment: Yes Clinical Summary of Care: Patient Declined Electronic Signature(s) Signed: 10/08/2019 5:42:41 PM By: Carlene Coria RN Entered By: Carlene Coria on 10/08/2019 11:09:19 -------------------------------------------------------------------------------- Lower Extremity Assessment Details Patient Name: Date of Service: [name], [name] 10/08/2019 9:00 AM Medical Record [id number] Patient Account Number: [id number] Date of Birth/Sex: Treating RN: [birth year] (54 y.o. [name] Primary Care Ambera Fedele: [name] Other Clinician: Referring Talene Glastetter: Treating Azaria Bartell/Extender:Stone III, Delray Alt, Fuller Plan in Treatment: 114 Edema Assessment Assessed: [Left: Yes] [Right: Yes] Edema: [Left: Yes] [Right: Yes] Calf Left: Right: Point of Measurement: 28 cm From Medial Instep 34 cm 29 cm Ankle Left: Right: Point of Measurement: 9 cm From Medial Instep 31 cm 31 cm Vascular Assessment Pulses: Dorsalis Pedis Palpable: [Left:No] [Right:No] Electronic Signature(s) Signed: 10/08/2019 5:41:11 PM By: Deon Pilling Entered By: Deon Pilling on 10/08/2019 09:37:42 -------------------------------------------------------------------------------- [place] Details Patient Name: Date of Service: [name], [name] 10/08/2019 9:00 AM Medical Record [id number] Patient Account Number: [id number] Date of Birth/Sex: Treating RN: 06-Feb-1966 (54 y.o. [name] Primary Care Othniel Maret: [name] Other Clinician: Referring Bethaney Oshana: Treating Jewell Ryans/Extender:Stone III, Delray Alt, Fuller Plan in Treatment: 114 Active Inactive Pressure Nursing Diagnoses: Knowledge deficit related to causes and risk factors for  pressure ulcer development Knowledge deficit related to management of pressures ulcers Potential for impaired tissue integrity related to pressure, friction, moisture, and shear Goals: Patient will remain free from development of additional pressure ulcers Target Resolution Date Initiated: 12/12/2017 Date Inactivated: 08/14/2018 Date: 08/21/2018 Goal Status: Unmet Unmet Reason: hospitalized Patient will remain free of pressure ulcers Date Initiated: 12/12/2017 Date Inactivated: 02/06/2018 Target Resolution Date: 02/06/2018 Unmet Reason: pt Goal Status: Unmet noncompliant wth offloading Patient/caregiver will verbalize understanding of pressure ulcer management Date Initiated: 12/12/2017 Target Resolution Date: 11/05/2019 Goal Status: Active Interventions: Assess: immobility, friction, shearing, incontinence upon admission and as needed Assess offloading mechanisms upon admission and as needed Assess  potential for pressure ulcer upon admission and as needed Notes: Wound/Skin Impairment Nursing Diagnoses: Impaired tissue integrity Goals: Patient/caregiver will verbalize understanding of skin care regimen Date Initiated: 03/06/2018 Target Resolution Date: 11/05/2019 Goal Status: Active Ulcer/skin breakdown will heal within 14 weeks Date Initiated: 07/31/2017 Date Inactivated: 01/09/2018 Target Resolution Date: 11/30/2017 Unmet Goal Status: Unmet Reason: noncompliant with offloading Interventions: Assess patient/caregiver ability to perform ulcer/skin care regimen upon admission and as needed Notes: Electronic Signature(s) Signed: 10/08/2019 6:06:10 PM By: Baruch Gouty RN, BSN Entered By: Baruch Gouty on 10/08/2019 09:32:03 -------------------------------------------------------------------------------- Pain Assessment Details Patient Name: Date of Service: [name], [name] 10/08/2019 9:00 AM Medical Record [id number] Patient Account Number: [id number] Date of  Birth/Sex: Treating RN: 06-May-1966 (54 y.o. [name] Primary Care Yuritza Paulhus: [name] Other Clinician: Referring Shinichi Anguiano: Treating Camani Sesay/Extender:Stone III, Delray Alt, Fuller Plan in Treatment: 114 Active Problems Location of Pain Severity and Description of Pain Patient Has Paino Yes Site Locations Pain Location: Generalized Pain, Pain in Ulcers Rate the pain. Current Pain Level: 7 Worst Pain Level: 10 Least Pain Level: 0 Tolerable Pain Level: 9 Character of Pain Describe the Pain: Aching, Heavy, Sharp Pain Management and Medication Current Pain Management: Medication: No Cold Application: No Rest: No Massage: No Activity: No T.E.N.S.: No Heat Application: No Leg drop or elevation: No Is the Current Pain Management Adequate: Adequate How does your wound impact your activities of daily livingo Sleep: No Bathing: No Appetite: No Relationship With Others: No Bladder Continence: No Emotions: No Bowel Continence: No Work: No Toileting: No Drive: No Dressing: No Hobbies: No Electronic Signature(s) Signed: 10/08/2019 5:41:11 PM By: Deon Pilling Entered By: Deon Pilling on 10/08/2019 09:31:39 -------------------------------------------------------------------------------- Patient/Caregiver Education Details Patient Name: Date of Service: [name], [name] 1/6/2021andnbsp9:00 AM Medical Record [id number] Patient Account Number: [id number] Date of Birth/Gender: Treating RN: 14-Sep-1966 (54 y.o. [name] Primary Care Physician: [name] Other Clinician: Referring Physician: Treating Physician/Extender:Stone III, Delray Alt, Fuller Plan in Treatment: [place] Education Assessment Education Provided To: Patient Education Topics Provided Pressure: Methods: Explain/Verbal Responses: Reinforcements needed, State content correctly Wound/Skin Impairment: Methods: Explain/Verbal Responses: Reinforcements needed, State  content correctly Electronic Signature(s) Signed: 10/08/2019 6:06:10 PM By: Baruch Gouty RN, BSN Entered By: Baruch Gouty on 10/08/2019 09:32:25 -------------------------------------------------------------------------------- Wound Assessment Details Patient Name: Date of Service: [name], [name] 10/08/2019 9:00 AM Medical Record [id number] Patient Account Number: [id number] Date of Birth/Sex: Treating RN: [birth year] (54 y.o. [name] Primary Care Colisha Redler: [name] Other Clinician: Referring Kalia Vahey: Treating Shaundrea Carrigg/Extender:Stone III, Delray Alt, Fuller Plan in Treatment: 114 Wound Status Wound Number: 17 Primary Pressure Ulcer Etiology: Wound Location: Right Ischial Tuberosity Wound Open Wounding Event: Pressure Injury Status: Date Acquired: 10/02/2013 Comorbid Anemia, Hypertension, Colitis, Weeks Of Treatment: 114 History: Osteomyelitis, Paraplegia Clustered Wound: No Photos Wound Measurements Length: (cm) 2.5 Width: (cm) 2.5 Depth: (cm) 1.5 Area: (cm) 4.909 Volume: (cm) 7.363 % Reduction in Area: 78.1% % Reduction in Volume: 79.4% Epithelialization: Small (1-33%) Tunneling: No Undermining: Yes Starting Position (o'clock): 3 Ending Position (o'clock): 6 Maximum Distance: (cm) 1.7 Wound Description Classification: Category/Stage IV Wound Margin: Fibrotic scar, thickened scar Exudate Amount: Medium Exudate Type: Serosanguineous Exudate Color: red, brown Wound Bed Granulation Amount: Large (67-100%) Granulation Quality: Pink, Pale Necrotic Amount: Small (1-33%) Necrotic Quality: Adherent Slough Foul Odor After Cleansing: No Slough/Fibrino Yes Exposed Structure Fascia Exposed: No Fat Layer (Subcutaneous Tissue) Exposed: Yes Tendon Exposed: No Muscle Exposed: No Joint Exposed: No Bone Exposed: No Treatment Notes Wound #17 (Right Ischial Tuberosity) 1. Cleanse With Wound Cleanser  3. Primary Dressing Applied Calcium  Alginate Ag 4. Secondary Dressing ABD Pad Dry Gauze 5. Secured With Recruitment consultant) Signed: 10/10/2019 3:20:21 PM By: Mikeal Hawthorne EMT/HBOT Signed: 10/20/2019 5:45:39 PM By: Deon Pilling Previous Signature: 10/08/2019 5:41:11 PM Version By: Deon Pilling Entered By: Mikeal Hawthorne on 10/10/2019 13:31:55 -------------------------------------------------------------------------------- Wound Assessment Details Patient Name: Date of Service: [name], [name] 10/08/2019 9:00 AM Medical Record [id number] Patient Account Number: [id number] Date of Birth/Sex: Treating RN: 14-Feb-1966 (54 y.o. [name] Primary Care Elasha Tess: [name] Other Clinician: Referring Iven Earnhart: Treating Brendyn Mclaren/Extender:Stone III, Delray Alt, Fuller Plan in Treatment: 114 Wound Status Wound Number: 19 Primary Pressure Ulcer Etiology: Wound Location: Sacrum Wound Open Wounding Event: Pressure Injury Status: Date Acquired: 10/02/2013 Comorbid Anemia, Hypertension, Colitis, Weeks Of Treatment: 114 History: Osteomyelitis, Paraplegia Clustered Wound: No Photos Wound Measurements Length: (cm) 1.5 % Reduction in A Width: (cm) 1.5 % Reduction in V Depth: (cm) 0.9 Epithelializatio Area: (cm) 1.767 Tunneling: Volume: (cm) 1.59 Undermining: Starting Posi Ending Positi Maximum Dista rea: 17.3% olume: 42.7% n: Medium (34-66%) No Yes tion (o'clock): 12 on (o'clock): 12 nce: (cm) 1.3 Wound Description Classification: Category/Stage IV Foul Odor After Wound Margin: Fibrotic scar, thickened scar Slough/Fibrino Exudate Amount: Medium Exudate Type: Serosanguineous Exudate Color: red, brown Wound Bed Granulation Amount: Large (67-100%) Granulation Quality: Red Fascia Exposed: Necrotic Amount: None Present (0%) Fat Layer (Subcu Tendon Exposed: Muscle Exposed: Joint Exposed: Bone Exposed: Treatment Notes Wound #19 (Sacrum) 1. Cleanse With Wound Cleanser 3. Primary  Dressing Applied Calcium Alginate Ag 4. Secondary Dressing ABD Pad Dry Gauze 5. Secured With Tape Cleansing: No No Exposed Structure No taneous Tissue) Exposed: Yes No No No No Electronic Signature(s) Signed: 10/10/2019 3:20:21 PM By: Mikeal Hawthorne EMT/HBOT Signed: 10/20/2019 5:45:39 PM By: Deon Pilling Previous Signature: 10/08/2019 5:41:11 PM Version By: Deon Pilling Entered By: Mikeal Hawthorne on 10/10/2019 13:37:38 -------------------------------------------------------------------------------- Wound Assessment Details Patient Name: Date of Service: [name], [name] 10/08/2019 9:00 AM Medical Record [id number] Patient Account Number: [id number] Date of Birth/Sex: Treating RN: 1966/02/14 (54 y.o. [name] Primary Care Bay Wayson: [name] Other Clinician: Referring Cynda Soule: Treating Shandelle Borrelli/Extender:Stone III, Delray Alt, Fuller Plan in Treatment: 114 Wound Status Wound Number: 21 Primary Pressure Ulcer Etiology: Wound Location: Right Ischium - Medial Wound Open Wounding Event: Gradually Appeared Status: Date Acquired: 10/02/2012 Comorbid Anemia, Hypertension, Colitis, Weeks Of Treatment: 79 History: Osteomyelitis, Paraplegia Clustered Wound: No Photos Wound Measurements Length: (cm) 0.4 % Reductio Width: (cm) 1.4 % Reductio Depth: (cm) 2.3 Epithelial Area: (cm) 0.44 Tunneling Volume: (cm) 1.012 Undermini Wound Description Classification: Category/Stage II Wound Margin: Well defined, not attached Exudate Amount: Small Exudate Type: Serosanguineous Exudate Color: red, brown Wound Bed Granulation Amount: Large (67-100%) Granulation Quality: Pink Necrotic Amount: None Present (0%) Foul Odor After Cleansing: No Slough/Fibrino No Exposed Structure Fascia Exposed: No Fat Layer (Subcutaneous Tissue) Exposed: Yes Tendon Exposed: No Muscle Exposed: No Joint Exposed: No Bone Exposed: No n in Area: 84.4% n in Volume: 82.1% ization:  Small (1-33%) : No ng: No Treatment Notes Wound #21 (Right, Medial Ischium) 1. Cleanse With Wound Cleanser 3. Primary Dressing Applied Calcium Alginate Ag 4. Secondary Dressing ABD Pad Dry Gauze 5. Secured With Recruitment consultant) Signed: 10/10/2019 3:20:21 PM By: Mikeal Hawthorne EMT/HBOT Signed: 10/20/2019 5:45:39 PM By: Deon Pilling Previous Signature: 10/08/2019 5:41:11 PM Version By: Deon Pilling Entered By: Mikeal Hawthorne on 10/10/2019 13:31:31 -------------------------------------------------------------------------------- Wound Assessment Details Patient Name: Date of Service: [name], [name] 10/08/2019 9:00 AM Medical Record [id number] Patient Account Number: [id number]  Date of Birth/Sex: Treating RN: August 28, 1966 (54 y.o. [name] Primary Care Burnell Hurta: [name] Other Clinician: Referring Aubriee Szeto: Treating Gotti Alwin/Extender:Stone III, Delray Alt, Fuller Plan in Treatment: 114 Wound Status Wound Number: 22 Primary Pressure Ulcer Etiology: Wound Location: Left Ischium Wound Open Wounding Event: Gradually Appeared Status: Date Acquired: 10/02/2013 Comorbid Anemia, Hypertension, Colitis, Weeks Of Treatment: 79 History: Osteomyelitis, Paraplegia Clustered Wound: No Photos Wound Measurements Length: (cm) 2 Width: (cm) 0.8 Depth: (cm) 3 Area: (cm) 1.257 Volume: (cm) 3.77 % Reduction in Area: -196.5% % Reduction in Volume: -217.3% Epithelialization: Small (1-33%) Undermining: Yes Starting Position (o'clock): 12 Ending Position (o'clock): 12 Maximum Distance: (cm) 2.3 Wound Description Classification: Category/Stage II Wound Margin: Well defined, not attached Exudate Amount: Medium Exudate Type: Serosanguineous Exudate Color: red, brown Wound Bed Granulation Amount: Large (67-100%) Granulation Quality: Pink Necrotic Amount: None Present (0%) Foul Odor After Cleansing: No Slough/Fibrino No Exposed Structure Fascia  Exposed: No Fat Layer (Subcutaneous Tissue) Exposed: Yes Tendon Exposed: No Muscle Exposed: No Joint Exposed: No Bone Exposed: No Treatment Notes Wound #22 (Left Ischium) 1. Cleanse With Wound Cleanser 3. Primary Dressing Applied Calcium Alginate Ag 4. Secondary Dressing ABD Pad Dry Gauze 5. Secured With Recruitment consultant) Signed: 10/10/2019 3:20:21 PM By: Mikeal Hawthorne EMT/HBOT Signed: 10/20/2019 5:45:39 PM By: Deon Pilling Previous Signature: 10/08/2019 5:41:11 PM Version By: Deon Pilling Entered By: Mikeal Hawthorne on 10/10/2019 12:03:42 -------------------------------------------------------------------------------- Wound Assessment Details Patient Name: Date of Service: [name], [name] 10/08/2019 9:00 AM Medical Record [id number] Patient Account Number: [id number] Date of Birth/Sex: Treating RN: [birth year] (55 y.o. [name] Primary Care Berdia Lachman: [name] Other Clinician: Referring Argus Caraher: Treating Rondal Vandevelde/Extender:Stone III, Delray Alt, Fuller Plan in Treatment: 114 Wound Status Wound Number: 23 Primary Pressure Ulcer Etiology: Wound Location: Right Gluteus Wound Open Wounding Event: Gradually Appeared Status: Date Acquired: 09/18/2018 Comorbid Anemia, Hypertension, Colitis, Weeks Of Treatment: 52 History: Osteomyelitis, Paraplegia Clustered Wound: No Photos Wound Measurements Length: (cm) 0.4 % Reduction in Are Width: (cm) 0.5 % Reduction in Vol Depth: (cm) 1.3 Epithelialization: Area: (cm) 0.157 Tunneling: Volume: (cm) 0.204 Undermining: Wound Description Classification: Category/Stage II Foul Odor After C Wound Margin: Well defined, not attached Slough/Fibrino Exudate Amount: Medium Exudate Type: Serosanguineous Exudate Color: red, brown Wound Bed Granulation Amount: Large (67-100%) Granulation Quality: Pink Fascia Exposed: Necrotic Amount: None Present (0%) Fat Layer (Subcuta Tendon Exposed: Muscle  Exposed: Joint Exposed: Bone Exposed: leansing: No No Exposed Structure No neous Tissue) Exposed: Yes No No No No a: 54.6% ume: 78.9% Small (1-33%) No No Treatment Notes Wound #23 (Right Gluteus) 1. Cleanse With Wound Cleanser 3. Primary Dressing Applied Calcium Alginate Ag 4. Secondary Dressing ABD Pad Dry Gauze 5. Secured With Recruitment consultant) Signed: 10/10/2019 3:20:21 PM By: Mikeal Hawthorne EMT/HBOT Signed: 10/20/2019 5:45:39 PM By: Deon Pilling Previous Signature: 10/08/2019 5:41:11 PM Version By: Deon Pilling Entered By: Mikeal Hawthorne on 10/10/2019 12:04:40 -------------------------------------------------------------------------------- Wound Assessment Details Patient Name: Date of Service: ELDRIC, JOACHIM 10/08/2019 9:00 AM Medical Record [id number] Patient Account Number: [id number] Date of Birth/Sex: Treating RN: 03-09-1966 (54 y.o. [name] Primary Care Jerrik Housholder: [name] Other Clinician: Referring Lynasia Meloche: Treating Guyla Bless/Extender:Stone III, Delray Alt, Fuller Plan in Treatment: 114 Wound Status Wound Number: 24 Primary Pressure Ulcer Etiology: Wound Location: Right Scrotum Wound Open Wounding Event: Gradually Appeared Status: Date Acquired: 08/06/2019 Comorbid Anemia, Hypertension, Colitis, Weeks Of Treatment: 9 History: Osteomyelitis, Paraplegia Clustered Wound: No Photos Wound Measurements Length: (cm) 1 Width: (cm) 0.4 Depth: (cm) 0.1 Area: (cm) 0.314 Volume: (cm)  0.031 Wound Description Classification: Category/Stage III Wound Margin: Flat and Intact Exudate Amount: Medium Exudate Type: Serosanguineous Exudate Color: red, brown Wound Bed Granulation Amount: Large (67-100%) Granulation Quality: Red Necrotic Amount: Small (1-33%) Necrotic Quality: Adherent Slough Foul Odor After Cleansing: No Slough/Fibrino Ye Exposed Structure Fascia Exposed: N Fat Layer (Subcutaneous Tissue) Exposed:  Y Tendon Exposed: N Muscle Exposed: N Joint Exposed: N Bone Exposed: N % Reduction in Area: 88.3% % Reduction in Volume: 88.4% Epithelialization: Large (67-100%) Tunneling: No Undermining: No s o es o o o o Treatment Notes Wound #24 (Right Scrotum) 1. Cleanse With Wound Cleanser 3. Primary Dressing Applied Calcium Alginate Ag 4. Secondary Dressing ABD Pad Dry Gauze 5. Secured With Recruitment consultant) Signed: 10/10/2019 3:20:21 PM By: Mikeal Hawthorne EMT/HBOT Signed: 10/20/2019 5:45:39 PM By: Deon Pilling Previous Signature: 10/08/2019 5:41:11 PM Version By: Deon Pilling Entered By: Mikeal Hawthorne on 10/10/2019 13:37:16 -------------------------------------------------------------------------------- Wound Assessment Details Patient Name: Date of Service: DELSHAUN, MONTIEL 10/08/2019 9:00 AM Medical Record [id number] Patient Account Number: [id number] Date of Birth/Sex: Treating RN: 1966/03/11 (54 y.o. [name] Primary Care Jusiah Aguayo: [name] Other Clinician: Referring Alaric Gladwin: Treating Topaz Raglin/Extender:Stone III, Delray Alt, Fuller Plan in Treatment: 114 Wound Status Wound Number: 25 Primary Pressure Ulcer Etiology: Wound Location: Right Foot - Dorsal Wound Open Wounding Event: Shear/Friction Status: Date Acquired: 09/24/2019 Comorbid Anemia, Hypertension, Colitis, Weeks Of Treatment: 0 History: Osteomyelitis, Paraplegia Clustered Wound: No Photos Wound Measurements Length: (cm) 0.5 % Reductio Width: (cm) 0.8 % Reductio Depth: (cm) 0.1 Epithelial Area: (cm) 0.314 Tunneling Volume: (cm) 0.031 Undermini Wound Description Classification: Category/Stage II Wound Margin: Distinct, outline attached Exudate Amount: Medium Exudate Type: Serosanguineous Exudate Color: red, brown Wound Bed Granulation Amount: Large (67-100%) Granulation Quality: Red, Pink Necrotic Amount: None Present (0%) Foul Odor After Cleansing:  No Slough/Fibrino Yes Exposed Structure Fascia Exposed: No Fat Layer (Subcutaneous Tissue) Exposed: No Tendon Exposed: No Muscle Exposed: No Joint Exposed: No Bone Exposed: No n in Area: 0% n in Volume: 0% ization: Small (1-33%) : No ng: No Treatment Notes Wound #25 (Right, Dorsal Foot) 1. Cleanse With Wound Cleanser 3. Primary Dressing Applied Colle[employer] AG 4. Secondary Dressing Dry Gauze Roll Gauze 5. Secured With Recruitment consultant) Signed: 10/10/2019 3:20:21 PM By: Mikeal Hawthorne EMT/HBOT Signed: 10/20/2019 5:45:39 PM By: Deon Pilling Previous Signature: 10/08/2019 5:41:11 PM Version By: Deon Pilling Entered By: Mikeal Hawthorne on 10/10/2019 12:02:56 -------------------------------------------------------------------------------- Wound Assessment Details Patient Name: Date of Service: JEFERSON, REYMOND 10/08/2019 9:00 AM Medical Record [id number] Patient Account Number: [id number] Date of Birth/Sex: Treating RN: [birth year] (54 y.o. [name] Primary Care Haydan Mansouri: [name] Other Clinician: Referring Tamelia Michalowski: Treating Shonn Farruggia/Extender:Stone III, Delray Alt, Fuller Plan in Treatment: 114 Wound Status Wound Number: 26 Primary Pressure Ulcer Etiology: Wound Location: Left Foot - Dorsal Wound Open Wounding Event: Shear/Friction Status: Date Acquired: 09/24/2019 Comorbid Anemia, Hypertension, Colitis, Weeks Of Treatment: 0 History: Osteomyelitis, Paraplegia Clustered Wound: No Photos Wound Measurements Length: (cm) 0.6 % Reduction i Width: (cm) 6 % Reduction i Depth: (cm) 0.2 Epithelializa Area: (cm) 2.827 Tunneling: Volume: (cm) 0.565 Undermining: Wound Description Classification: Category/Stage III Foul Odor Af Wound Margin: Distinct, outline attached Slough/Fibri Exudate Amount: Medium Exudate Type: Serosanguineous Exudate Color: red, brown Wound Bed Granulation Amount: Small (1-33%) Granulation Quality: Red Fascia  Expose Necrotic Amount: Large (67-100%) Fat Layer (Su Necrotic Quality: Eschar, Adherent Slough Tendon Expose Muscle Expose Joint Exposed Bone Exposed: ter Cleansing: No no Yes Exposed Structure d: No bcutaneous Tissue) Exposed: No d: No d:  No : No No n Area: 0% n Volume: 0% tion: None No No Treatment Notes Wound #26 (Left, Dorsal Foot) 1. Cleanse With Wound Cleanser 3. Primary Dressing Applied Collegen AG 4. Secondary Dressing Dry Gauze Roll Gauze 5. Secured With Recruitment consultant) Signed: 10/10/2019 3:20:21 PM By: Mikeal Hawthorne EMT/HBOT Signed: 10/20/2019 5:45:39 PM By: Deon Pilling Previous Signature: 10/08/2019 5:41:11 PM Version By: Deon Pilling Previous Signature: 10/08/2019 6:06:10 PM Version By: Baruch Gouty RN, BSN Entered By: Mikeal Hawthorne on 10/10/2019 12:03:20 -------------------------------------------------------------------------------- Vitals Details Patient Name: Date of Service: CAMAR, [place] 10/08/2019 9:00 AM Medical Record [id number] Patient Account Number: [id number] Date of Birth/Sex: Treating RN: [birth year] (54 y.o. [name] Primary Care Jerimie Mancuso: [name] Other Clinician: Referring Alain Deschene: Treating Prabhjot Maddux/Extender:Stone III, Delray Alt, Fuller Plan in Treatment: 114 Vital Signs Time Taken: 09:30 Temperature (F): 98.4 Height (in): 72 Pulse (bpm): 76 Weight (lbs): 215 Respiratory Rate (breaths/min): 20 Body Mass Index (BMI): 29.2 Blood Pressure (mmHg): 126/58 Reference Range: 80 - 120 mg / dl Electronic Signature(s) Signed: 10/08/2019 5:41:11 PM By: Deon Pilling Entered By: Deon Pilling on 10/08/2019 [id number]

## 2019-11-05 ENCOUNTER — Other Ambulatory Visit: Payer: Self-pay

## 2019-11-05 ENCOUNTER — Encounter (HOSPITAL_BASED_OUTPATIENT_CLINIC_OR_DEPARTMENT_OTHER): Payer: Medicare Other | Attending: Physician Assistant | Admitting: Physician Assistant

## 2019-11-05 DIAGNOSIS — F17210 Nicotine dependence, cigarettes, uncomplicated: Secondary | ICD-10-CM | POA: Diagnosis not present

## 2019-11-05 DIAGNOSIS — L89892 Pressure ulcer of other site, stage 2: Secondary | ICD-10-CM | POA: Diagnosis not present

## 2019-11-05 DIAGNOSIS — L89324 Pressure ulcer of left buttock, stage 4: Secondary | ICD-10-CM | POA: Insufficient documentation

## 2019-11-05 DIAGNOSIS — L89154 Pressure ulcer of sacral region, stage 4: Secondary | ICD-10-CM | POA: Insufficient documentation

## 2019-11-05 DIAGNOSIS — L89314 Pressure ulcer of right buttock, stage 4: Secondary | ICD-10-CM | POA: Diagnosis not present

## 2019-11-05 DIAGNOSIS — G8222 Paraplegia, incomplete: Secondary | ICD-10-CM | POA: Diagnosis not present

## 2019-11-05 DIAGNOSIS — L89893 Pressure ulcer of other site, stage 3: Secondary | ICD-10-CM | POA: Diagnosis not present

## 2019-11-06 NOTE — Progress Notes (Addendum)
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 -------------------------------------------------------------------------------- SuperBill Details Patient Name: Date of Service: ARMEN, LUST 11/05/2019 Medical Record [id number] Patient Account Number: [id number] Date of Birth/Sex: Treating RN: [birth year] (53 y.o. 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, incomplete F17.218 Nicotine dependence, cigarettes, with other nicotine-induced disorders Facility Procedures CPT4 Code: [id number] Description: [id number] - WOUND CARE VISIT-LEV 5 EST PT Modifier: Quantity: 1 Physician Procedures CPT4 Code: [id number] Description: [id number] - WC PHYS LEVEL 4 - EST PT ICD-10 Diagnosis Description L89.324 Pressure ulcer of left buttock, stage 4 L89.314 Pressure ulcer of right buttock, stage 4 L89.154 Pressure ulcer of sacral region, stage 4 L89.892 Pressure ulcer of other  site, stage 2 Modifier: Quantity: 1 Electronic Signature(s) Signed: 11/05/2019 10:10:03 AM By: [name] Keeler PA-C Entered By: [name] Keeler on 11/05/2019 10:10:02

## 2019-11-11 NOTE — Progress Notes (Signed)
[name], [name] ([id number]) Visit Report for 11/05/2019 Arrival Information Details Patient Name: Date of Service: [name], [name] 11/05/2019 9:00 AM Medical Record [id number] Patient Account Number: [id number] Date of Birth/Sex: Treating RN: [birth year] (54 y.o. Male) Levan Hurst Primary Care Allsion [place]: [name] Other Clinician: Referring Danya Spearman: Treating Diaz Crago/Extender:Stone III, Delray Alt, Fuller Plan in Treatment: 79 Visit Information History Since Last Visit Added or deleted any medications: No Patient Arrived: Wheel Chair Any new allergies or adverse reactions: No Arrival Time: 09:11 Had a fall or experienced change in No activities of daily living that may affect Accompanied By: alone risk of falls: Transfer Assistance: None Signs or symptoms of abuse/neglect since last No Patient Identification Verified: Yes visito Secondary Verification Process Yes Hospitalized since last visit: No Completed: Implantable device outside of the clinic excluding No Patient Requires Transmission-Based No cellular tissue based products placed in the center Precautions: since last visit: Patient Has Alerts: No Has Dressing in Place as Prescribed: Yes Pain Present Now: Yes Electronic Signature(s) Signed: 11/11/2019 5:30:42 PM By: Levan Hurst RN, BSN Entered By: Levan Hurst on 11/05/2019 09:11:31 -------------------------------------------------------------------------------- Clinic Level of Care Assessment Details Patient Name: Date of Service: [name], [name] 11/05/2019 9:00 AM Medical Record [id number] Patient Account Number: [id number] Date of Birth/Sex: Treating RN: 01-14-1966 (54 y.o. Male) Baruch Gouty Primary Care Lorretta Kerce: [name] Other Clinician: Referring Tashawn Greff: Treating Heyli Min/Extender:Stone III, Delray Alt, Fuller Plan in Treatment: 118 Clinic Level of Care Assessment Items TOOL 4 Quantity Score []  - Use when  only an EandM is performed on FOLLOW-UP visit 0 ASSESSMENTS - Nursing Assessment / Reassessment X - Reassessment of Co-morbidities (includes updates in patient status) 1 10 X - Reassessment of Adherence to Treatment Plan 1 5 ASSESSMENTS - Wound and Skin Assessment / Reassessment []  - Simple Wound Assessment / Reassessment - one wound 0 X - Complex Wound Assessment / Reassessment - multiple wounds 8 5 []  - Dermatologic / Skin Assessment (not related to wound area) 0 ASSESSMENTS - Focused Assessment []  - Circumferential Edema Measurements - multi extremities 0 []  - Nutritional Assessment / Counseling / Intervention 0 X - Lower Extremity Assessment (monofilament, tuning fork, pulses) 1 5 []  - Peripheral Arterial Disease Assessment (using hand held doppler) 0 ASSESSMENTS - Ostomy and/or Continence Assessment and Care []  - Incontinence Assessment and Management 0 []  - Ostomy Care Assessment and Management (repouching, etc.) 0 PROCESS - Coordination of Care X - Simple Patient / Family Education for ongoing care 1 15 []  - Complex (extensive) Patient / Family Education for ongoing care 0 X - Staff obtains Consents, Records, Test Results / Process Orders 1 10 []  - Staff telephones HHA, Nursing Homes / Clarify orders / etc 0 []  - Routine Transfer to another Facility (non-emergent condition) 0 []  - Routine Hospital Admission (non-emergent condition) 0 []  - New Admissions / Biomedical engineer / Ordering NPWT, Apligraf, etc. 0 []  - Emergency Hospital Admission (emergent condition) 0 X - Simple Discharge Coordination 1 10 []  - Complex (extensive) Discharge Coordination 0 PROCESS - Special Needs []  - Pediatric / Minor Patient Management 0 []  - Isolation Patient Management 0 []  - Hearing / Language / Visual special needs 0 []  - Assessment of Community assistance (transportation, D/C planning, etc.) 0 []  - Additional assistance / Altered mentation 0 []  - Support Surface(s) Assessment (bed,  cushion, seat, etc.) 0 INTERVENTIONS - Wound Cleansing / Measurement []  - Simple Wound Cleansing - one wound 0 X - Complex Wound Cleansing - multiple wounds 8 5 X - Wound Imaging (  photographs - any number of wounds) 1 5 []  - Wound Tracing (instead of photographs) 0 []  - Simple Wound Measurement - one wound 0 X - Complex Wound Measurement - multiple wounds 8 5 INTERVENTIONS - Wound Dressings X - Small Wound Dressing one or multiple wounds 7 10 []  - Medium Wound Dressing one or multiple wounds 0 []  - Large Wound Dressing one or multiple wounds 0 X - Application of Medications - topical 1 5 []  - Application of Medications - injection 0 INTERVENTIONS - Miscellaneous []  - External ear exam 0 []  - Specimen Collection (cultures, biopsies, blood, body fluids, etc.) 0 []  - Specimen(s) / Culture(s) sent or taken to Lab for analysis 0 []  - Patient Transfer (multiple staff / Harrel Lemon Lift / Similar devices) 0 []  - Simple Staple / Suture removal (25 or less) 0 []  - Complex Staple / Suture removal (26 or more) 0 []  - Hypo / Hyperglycemic Management (close monitor of Blood Glucose) 0 []  - Ankle / Brachial Index (ABI) - do not check if billed separately 0 X - Vital Signs 1 5 Has the patient been seen at the hospital within the last three years: Yes Total Score: 260 Level Of Care: New/Established - Level 5 Electronic Signature(s) Signed: 11/05/2019 6:36:45 PM By: Baruch Gouty RN, BSN Entered By: Baruch Gouty on 11/05/2019 09:59:41 -------------------------------------------------------------------------------- Encounter Discharge Information Details Patient Name: Date of Service: [name], [name] 11/05/2019 9:00 AM Medical Record [id number] Patient Account Number: [id number] Date of Birth/Sex: Treating RN: Dec 10, 1965 (54 y.o. Male) Carlene Coria Primary Care Geryl Dohn: [name] Other Clinician: Referring Bowie Delia: Treating Elanora Quin/Extender:Stone III, Delray Alt, Fuller Plan in  Treatment: 118 Encounter Discharge Information Items Discharge Condition: Stable Ambulatory Status: Wheelchair Discharge Destination: Home Transportation: Private Auto Accompanied By: self Schedule Follow-up Appointment: Yes Clinical Summary of Care: Patient Declined Electronic Signature(s) Signed: 11/07/2019 5:25:54 PM By: Carlene Coria RN Entered By: Carlene Coria on 11/05/2019 10:25:05 -------------------------------------------------------------------------------- Lower Extremity Assessment Details Patient Name: Date of Service: [name], [name] 11/05/2019 9:00 AM Medical Record [id number] Patient Account Number: [id number] Date of Birth/Sex: Treating RN: 1966-07-27 (54 y.o. Male) Levan Hurst Primary Care Shannyn Jankowiak: [name] Other Clinician: Referring Kassadie Pancake: Treating Kourtnei Rauber/Extender:Stone III, Delray Alt, Dwight Weeks in Treatment: 118 Edema Assessment Assessed: [Left: No] [Right: No] Edema: [Left: Yes] [Right: Yes] Calf Left: Right: Point of Measurement: 28 cm From Medial Instep 34 cm 30 cm Ankle Left: Right: Point of Measurement: 9 cm From Medial Instep 30 cm 30.5 cm Vascular Assessment Pulses: Dorsalis Pedis Palpable: [Left:Yes] [Right:Yes] Electronic Signature(s) Signed: 11/11/2019 5:30:42 PM By: Levan Hurst RN, BSN Entered By: Levan Hurst on 11/05/2019 09:20:01 -------------------------------------------------------------------------------- Multi-Disciplinary Care Plan Details Patient Name: Date of Service: [name], [name] 11/05/2019 9:00 AM Medical Record [id number] Patient Account Number: [id number] Date of Birth/Sex: Treating RN: [birth year] (54 y.o. Male) Baruch Gouty Primary Care Catelynn Sparger: [name] Other Clinician: Referring Crystalann Korf: Treating Rosmary Dionisio/Extender:Stone III, Delray Alt, Fuller Plan in Treatment: 118 Active Inactive Pressure Nursing Diagnoses: Knowledge deficit related to causes and risk factors  for pressure ulcer development Knowledge deficit related to management of pressures ulcers Potential for impaired tissue integrity related to pressure, friction, moisture, and shear Goals: Patient will remain free from development of additional pressure ulcers Date Inactivated: 08/14/2018 Target Resolution Date Initiated: 12/12/2017 Date: 08/21/2018 Goal Status: Unmet Unmet Reason: hospitalized Patient will remain free of pressure ulcers Date Initiated: 12/12/2017 Date Inactivated: 02/06/2018 Target Resolution Date: 02/06/2018 Unmet Reason: pt Goal Status: Unmet noncompliant wth offloading Patient/caregiver will verbalize understanding of pressure ulcer management  Date Initiated: 12/12/2017 Target Resolution Date: 12/03/2019 Goal Status: Active Interventions: Assess: immobility, friction, shearing, incontinence upon admission and as needed Assess offloading mechanisms upon admission and as needed Assess potential for pressure ulcer upon admission and as needed Notes: Wound/Skin Impairment Nursing Diagnoses: Impaired tissue integrity Goals: Patient/caregiver will verbalize understanding of skin care regimen Date Initiated: 03/06/2018 Target Resolution Date: 12/03/2019 Goal Status: Active Ulcer/skin breakdown will heal within 14 weeks Date Initiated: 07/31/2017 Date Inactivated: 01/09/2018 Target Resolution Date: 11/30/2017 Unmet Goal Status: Unmet Reason: noncompliant with offloading Interventions: Assess patient/caregiver ability to perform ulcer/skin care regimen upon admission and as needed Notes: Electronic Signature(s) Signed: 11/05/2019 6:36:45 PM By: Baruch Gouty RN, BSN Entered By: Baruch Gouty on 11/05/2019 09:56:42 -------------------------------------------------------------------------------- Patient/Caregiver Education Details Patient Name: Date of Service: [name], [name] 2/3/2021andnbsp9:00 AM Medical Record Patient Account Number:  [id number] Number: Treating RN: Baruch Gouty Date of Birth/Gender: Oct 24, 1965 (54 y.o. Other Clinician: Male) Treating [name] Keeler Primary Care Physician: [name] Physician/Extender: Referring Physician: Dionisio Paschal in Treatment: 118 Education Assessment Education Provided To: Patient Education Topics Provided Pressure: Methods: Explain/Verbal Responses: Reinforcements needed, State content correctly Wound/Skin Impairment: Methods: Explain/Verbal Responses: Reinforcements needed, State content correctly Electronic Signature(s) Signed: 11/05/2019 6:36:45 PM By: Baruch Gouty RN, BSN Entered By: Baruch Gouty on 11/05/2019 09:57:12 -------------------------------------------------------------------------------- Wound Assessment Details Patient Name: Date of Service: [name], [name] 11/05/2019 9:00 AM Medical Record [id number] Patient Account Number: [id number] Date of Birth/Sex: Treating RN: October 26, 1965 (54 y.o. Male) Primary Care Zanasia Hickson: [name] Other Clinician: Referring Yoandri Congrove: Treating Giorgia Wahler/Extender:Stone III, Delray Alt, Dwight Weeks in Treatment: 118 Wound Status Wound Number: 17 Primary Pressure Ulcer Etiology: Wound Location: Right Ischial Tuberosity Wound Open Wounding Event: Pressure Injury Status: Date Acquired: 10/02/2013 Comorbid Anemia, Hypertension, Colitis, Weeks Of Treatment: 118 History: Osteomyelitis, Paraplegia Clustered Wound: No Photos Wound Measurements Length: (cm) 1.1 % Reduction in A Width: (cm) 1.9 % Reduction in V Depth: (cm) 1.1 Epithelializatio Area: (cm) 1.641 Undermining: Volume: (cm) 1.806 Starting Pos Ending Positi Maximum Dista rea: 92.7% olume: 95% n: Small (1-33%) Yes ition (o'clock): 3 on (o'clock): 8 nce: (cm) 1.5 Wound Description Classification: Category/Stage IV Foul Odor After Wound Margin: Fibrotic scar, thickened scar Slough/Fibrino Exudate  Amount: Medium Exudate Type: Serosanguineous Exudate Color: red, brown Wound Bed Granulation Amount: Large (67-100%) Granulation Quality: Pink, Pale Fascia Exposed: Necrotic Amount: Small (1-33%) Fat Layer (Subcu Necrotic Quality: Adherent Slough Tendon Exposed: Muscle Exposed: Joint Exposed: Bone Exposed: Treatment Notes Wound #17 (Right Ischial Tuberosity) 1. Cleanse With Wound Cleanser 3. Primary Dressing Applied Calcium Alginate Ag 4. Secondary Dressing ABD Pad 5. Secured With Tape Cleansing: No Yes Exposed Structure No taneous Tissue) Exposed: Yes No No No No Electronic Signature(s) Signed: 11/10/2019 4:47:04 PM By: Mikeal Hawthorne EMT/HBOT Entered By: Mikeal Hawthorne on 11/10/2019 14:47:21 -------------------------------------------------------------------------------- Wound Assessment Details Patient Name: Date of Service: [name], [name] 11/05/2019 9:00 AM Medical Record [id number] Patient Account Number: [id number] Date of Birth/Sex: Treating RN: Mar 14, 1966 (54 y.o. Male) Levan Hurst Primary Care Daqwan Dougal: [name] Other Clinician: Referring Mckinnley Cottier: Treating Chawn Spraggins/Extender:Stone III, Delray Alt, Dwight Weeks in Treatment: 118 Wound Status Wound Number: 19 Primary Pressure Ulcer Etiology: Wound Location: Sacrum Wound Open Wounding Event: Pressure Injury Status: Date Acquired: 10/02/2013 Comorbid Anemia, Hypertension, Colitis, Weeks Of Treatment: 118 History: Osteomyelitis, Paraplegia Clustered Wound: No Wound Measurements Length: (cm) 1 Width: (cm) 0.8 Depth: (cm) 0.7 Area: (cm) 0.628 Volume: (cm) 0.44 % Reduction in Area: 70.6% % Reduction in Volume: 84.2% Epithelialization: Medium (34-66%) Undermining: Yes Starting Position (o'clock): 2  Ending Position (o'clock): 11 Maximum Distance: (cm) 1.5 Wound Description Classification: Category/Stage IV Foul Odor A Wound Margin: Fibrotic scar, thickened scar  Slough/Fibr Exudate Amount: Medium Exudate Type: Serosanguineous Exudate Color: red, brown Wound Bed Granulation Amount: Large (67-100%) Granulation Quality: Red Fascia Expos Necrotic Amount: None Present (0%) Fat Layer (S Tendon Expos Muscle Expos Joint Expose Bone Exposed fter Cleansing: No ino No Exposed Structure ed: No ubcutaneous Tissue) Exposed: Yes ed: No ed: No d: No : No Treatment Notes Wound #19 (Sacrum) 1. Cleanse With Wound Cleanser 3. Primary Dressing Applied Calcium Alginate Ag 4. Secondary Dressing ABD Pad 5. Secured With Recruitment consultant) Signed: 11/11/2019 5:30:42 PM By: Levan Hurst RN, BSN Entered By: Levan Hurst on 11/05/2019 09:26:37 -------------------------------------------------------------------------------- Wound Assessment Details Patient Name: Date of Service: JAHMALI, HINNANT 11/05/2019 9:00 AM Medical Record [id number] Patient Account Number: [id number] Date of Birth/Sex: Treating RN: 1966-04-10 (54 y.o. Male) Primary Care Corayma Cashatt: [name] Other Clinician: Referring Makaya Juneau: Treating Rushie Brazel/Extender:Stone III, Delray Alt, Dwight Weeks in Treatment: 118 Wound Status Wound Number: 21 Primary Pressure Ulcer Etiology: Wound Location: Right Ischium - Medial Wound Open Wounding Event: Gradually Appeared Status: Date Acquired: 10/02/2012 Comorbid Anemia, Hypertension, Colitis, Weeks Of Treatment: 83 History: Osteomyelitis, Paraplegia Clustered Wound: No Photos Wound Measurements Length: (cm) 0.4 Width: (cm) 0.4 Depth: (cm) 2.3 Area: (cm) 0.126 Volume: (cm) 0.289 Wound Description Classification: Category/Stage II Wound Margin: Well defined, not attached Exudate Amount: Small Exudate Type: Serosanguineous Exudate Color: red, brown Wound Bed Granulation Amount: Large (67-100%) Granulation Quality: Pink Necrotic Amount: None Present (0%) ter Cleansing: No no No Exposed Structure d:  No bcutaneous Tissue) Exposed: Yes d: No d: No : No No % Reduction in Area: 95.5% % Reduction in Volume: 94.9% Epithelialization: Small (1-33%) Tunneling: No Undermining: No Foul Odor Af Slough/Fibri Fascia Expose Fat Layer (Su Tendon Expose Muscle Expose Joint Exposed Bone Exposed: Treatment Notes Wound #21 (Right, Medial Ischium) 1. Cleanse With Wound Cleanser 3. Primary Dressing Applied Calcium Alginate Ag 4. Secondary Dressing ABD Pad 5. Secured With Recruitment consultant) Signed: 11/10/2019 4:47:04 PM By: Mikeal Hawthorne EMT/HBOT Entered By: Mikeal Hawthorne on 11/10/2019 14:47:50 -------------------------------------------------------------------------------- Wound Assessment Details Patient Name: Date of Service: [name], [name] 11/05/2019 9:00 AM Medical Record [id number] Patient Account Number: [id number] Date of Birth/Sex: Treating RN: May 04, 1966 (54 y.o. Male) Primary Care Melis Trochez: [name] Other Clinician: Referring Kenzie Thoreson: Treating Derrick Tiegs/Extender:Stone III, Delray Alt, Dwight Weeks in Treatment: 118 Wound Status Wound Number: 22 Primary Pressure Ulcer Etiology: Wound Location: Left Ischium Wound Open Wounding Event: Gradually Appeared Status: Date Acquired: 10/02/2013 Comorbid Anemia, Hypertension, Colitis, Weeks Of Treatment: 83 Weeks Of Treatment: 83 History: Osteomyelitis, Paraplegia Clustered Wound: No Photos Wound Measurements Length: (cm) 2.3 % Reduction i Width: (cm) 0.5 % Reduction i Depth: (cm) 2.3 Epithelializa Area: (cm) 0.903 Undermining: Volume: (cm) 2.077 Starting Ending Pos Maximum Di n Area: -113% n Volume: -74.8% tion: Small (1-33%) Yes Position (o'clock): 12 ition (o'clock): 12 stance: (cm) 2.5 Wound Description Classification: Category/Stage II Foul Odor Af Wound Margin: Epibole Slough/Fibri Exudate Amount: Medium Exudate Type: Serosanguineous Exudate Color: red, brown Wound  Bed Granulation Amount: Large (67-100%) Granulation Quality: Pink Fascia Expose Necrotic Amount: None Present (0%) Fat Layer (Su Tendon Expose Muscle Expose Joint Exposed Bone Exposed: ter Cleansing: No no No Exposed Structure d: No bcutaneous Tissue) Exposed: Yes d: No d: No : No No Treatment Notes Wound #22 (Left Ischium) 1. Cleanse With Wound Cleanser 3. Primary Dressing Applied Calcium Alginate Ag 4. Secondary Dressing ABD Pad  5. Secured With Recruitment consultant) Signed: 11/10/2019 4:47:04 PM By: Mikeal Hawthorne EMT/HBOT Entered By: Mikeal Hawthorne on 11/10/2019 14:46:29 -------------------------------------------------------------------------------- Wound Assessment Details Patient Name: Date of Service: [name], [name] 11/05/2019 9:00 AM Medical Record [id number] Patient Account Number: [id number] Date of Birth/Sex: Treating RN: 1965-11-08 (54 y.o. Male) Primary Care Malala Trenkamp: [name] Other Clinician: Referring Chau Savell: Treating Caress Reffitt/Extender:Stone III, Delray Alt, Dwight Weeks in Treatment: 118 Wound Status Wound Number: 23 Primary Pressure Ulcer Etiology: Wound Location: Right Gluteus Wound Open Wounding Event: Gradually Appeared Status: Date Acquired: 09/18/2018 Comorbid Anemia, Hypertension, Colitis, Weeks Of Treatment: 56 History: Osteomyelitis, Paraplegia Clustered Wound: No Photos Wound Measurements Length: (cm) 0.4 % Reduction in A Width: (cm) 0.3 % Reduction in V Depth: (cm) 2.6 Epithelializatio Area: (cm) 0.094 Tunneling: Volume: (cm) 0.245 Undermining: Wound Description Classification: Category/Stage II Foul Odor After Wound Margin: Well defined, not attached Slough/Fibrino Exudate Amount: Medium Exudate Type: Serosanguineous Exudate Color: red, brown Wound Bed Granulation Amount: Large (67-100%) Granulation Quality: Pink Fascia Exposed: Necrotic Amount: None Present (0%) Fat Layer (Subcu Tendon  Exposed: Muscle Exposed: Joint Exposed: Bone Exposed: Cleansing: No No Exposed Structure No taneous Tissue) Exposed: Yes No No No No rea: 72.8% olume: 74.7% n: Medium (34-66%) No No Treatment Notes Wound #23 (Right Gluteus) 1. Cleanse With Wound Cleanser 3. Primary Dressing Applied Calcium Alginate Ag 4. Secondary Dressing ABD Pad 5. Secured With Recruitment consultant) Signed: 11/10/2019 4:47:04 PM By: Mikeal Hawthorne EMT/HBOT Entered By: Mikeal Hawthorne on 11/10/2019 14:55:02 -------------------------------------------------------------------------------- Wound Assessment Details Patient Name: Date of Service: [name], [name] 11/05/2019 9:00 AM Medical Record [id number] Patient Account Number: [id number] Date of Birth/Sex: Treating RN: [birth year] (54 y.o. Male) Primary Care Noa Constante: [name] Other Clinician: Referring Konnor Vondrasek: Treating Borghild Thaker/Extender:Stone III, Delray Alt, Dwight Weeks in Treatment: 118 Wound Status Wound Number: 24 Primary Pressure Ulcer Etiology: Wound Location: Right Scrotum Wound Open Wounding Event: Gradually Appeared Status: Date Acquired: 08/06/2019 Comorbid Anemia, Hypertension, Colitis, Weeks Of Treatment: 13 History: Osteomyelitis, Paraplegia Clustered Wound: No Photos Wound Measurements Length: (cm) 0.9 Width: (cm) 0.4 Depth: (cm) 0.1 Area: (cm) 0.283 Volume: (cm) 0.028 Wound Description Classification: Category/Stage III Wound Margin: Flat and Intact Exudate Amount: Medium Exudate Type: Serosanguineous Exudate Color: red, brown Wound Bed Granulation Amount: Large (67-100%) Granulation Quality: Red Necrotic Amount: Small (1-33%) Necrotic Quality: Adherent Slough ter Cleansing: No no Yes Exposed Structure sed: No Subcutaneous Tissue) Exposed: Yes sed: No sed: No ed: No d: No % Reduction in Area: 89.4% % Reduction in Volume: 89.6% Epithelialization: Medium (34-66%) Tunneling:  No Undermining: No Foul Odor Af Slough/Fibri Fascia Expo Fat Layer ( Tendon Expo Muscle Expo Joint Expos Bone Expose Treatment Notes Wound #24 (Right Scrotum) 1. Cleanse With Wound Cleanser 3. Primary Dressing Applied Calcium Alginate Ag 4. Secondary Dressing ABD Pad 5. Secured With Recruitment consultant) Signed: 11/10/2019 4:47:04 PM By: Mikeal Hawthorne EMT/HBOT Entered By: Mikeal Hawthorne on 11/10/2019 14:46:58 -------------------------------------------------------------------------------- Wound Assessment Details Patient Name: Date of Service: [name], [name] 11/05/2019 9:00 AM Medical Record [id number] Patient Account Number: [id number] Date of Birth/Sex: Treating RN: [birth year] (54 y.o. Male) Primary Care Cambry Spampinato: [name] Other Clinician: Referring Decker Cogdell: Treating Nasteho Glantz/Extender:Stone III, Delray Alt, Dwight Weeks in Treatment: 118 Wound Status Wound Number: 25 Primary Pressure Ulcer Etiology: Wound Location: Right Foot - Dorsal Wound Healed - Epithelialized Wounding Event: Shear/Friction Status: Date Acquired: 09/24/2019 Comorbid Anemia, Hypertension, Colitis, Weeks Of Treatment: 4 History: Osteomyelitis, Paraplegia Clustered Wound: No Photos Wound Measurements Length: (cm) 0 % Reduction Width: (cm) 0 % Reduction Depth: (  cm) 0 Epitheliali Area: (cm) 0 Tunneling: Volume: (cm) 0 Underminin Wound Description Classification: Category/Stage II Foul Odor Wound Margin: Distinct, outline attached Slough/Fib Exudate Amount: None Present Wound Bed Granulation Amount: None Present (0%) Necrotic Amount: None Present (0%) Fascia Expo Fat Layer ( Tendon Expo Muscle Expo Joint Expos Bone Expose After Cleansing: No rino No Exposed Structure sed: No Subcutaneous Tissue) Exposed: No sed: No sed: No ed: No d: No in Area: 100% in Volume: 100% zation: Large (67-100%) No g: No Electronic Signature(s) Signed: 11/10/2019  4:47:04 PM By: Mikeal Hawthorne EMT/HBOT Entered By: Mikeal Hawthorne on 11/10/2019 14:45:39 -------------------------------------------------------------------------------- Wound Assessment Details Patient Name: Date of Service: [name], MARINEZ 11/05/2019 9:00 AM Medical Record [id number] Patient Account Number: [id number] Date of Birth/Sex: Treating RN: 05-Apr-1966 (54 y.o. Male) Primary Care Luqman Perrelli: [name] Other Clinician: Referring Joson Sapp: Treating Genavie Boettger/Extender:Stone III, Delray Alt, Dwight Weeks in Treatment: 118 Wound Status Wound Number: 26 Primary Pressure Ulcer Etiology: Wound Location: Left Foot - Dorsal Wound Open Wounding Event: Shear/Friction Status: Date Acquired: 09/24/2019 Comorbid Anemia, Hypertension, Colitis, Weeks Of Treatment: 4 History: Osteomyelitis, Paraplegia Clustered Wound: No Photos Wound Measurements Length: (cm) 0.5 % Reductio Width: (cm) 4.3 % Reductio Depth: (cm) 0.3 Epithelial Area: (cm) 1.689 Tunneling Volume: (cm) 0.507 Undermini Wound Description Classification: Category/Stage III Wound Margin: Distinct, outline attached Exudate Amount: Medium Exudate Type: Serosanguineous Exudate Color: red, brown Wound Bed Granulation Amount: Medium (34-66%) Granulation Quality: Pink Necrotic Amount: Medium (34-66%) Necrotic Quality: Adherent Slough Foul Odor After Cleansing: No Slough/Fibrino Yes Exposed Structure Fascia Exposed: No Fat Layer (Subcutaneous Tissue) Exposed: Yes Tendon Exposed: No Muscle Exposed: No Joint Exposed: No Bone Exposed: No n in Area: 40.3% n in Volume: 10.3% ization: Small (1-33%) : No ng: No Treatment Notes Wound #26 (Left, Dorsal Foot) 1. Cleanse With Wound Cleanser 3. Primary Dressing Applied Collegen AG 4. Secondary Dressing Dry Gauze Roll Gauze 5. Secured With Recruitment consultant) Signed: 11/10/2019 4:47:04 PM By: Mikeal Hawthorne EMT/HBOT Entered By: Mikeal Hawthorne  on 11/10/2019 14:46:04 -------------------------------------------------------------------------------- Vitals Details Patient Name: Date of Service: ELIN, ZAKOWSKI 11/05/2019 9:00 AM Medical Record [id number] Patient Account Number: [id number] Date of Birth/Sex: Treating RN: 03-15-1966 (54 y.o. Male) Levan Hurst Primary Care Nasiah Polinsky: [name] Other Clinician: Referring Dragon Thrush: Treating Kellianne Ek/Extender:Stone III, Delray Alt, Dwight Weeks in Treatment: 118 Vital Signs Time Taken: 09:12 Temperature (F): 98.6 Height (in): 72 Pulse (bpm): 82 Weight (lbs): 215 Respiratory Rate (breaths/min): 18 Body Mass Index (BMI): 29.2 Blood Pressure (mmHg): 129/64 Reference Range: 80 - 120 mg / dl Electronic Signature(s) Signed: 11/11/2019 5:30:42 PM By: Levan Hurst RN, BSN Entered By: Levan Hurst on 11/05/2019 09:19:17

## 2019-12-10 ENCOUNTER — Encounter (HOSPITAL_BASED_OUTPATIENT_CLINIC_OR_DEPARTMENT_OTHER): Payer: Medicare Other | Attending: Physician Assistant | Admitting: Physician Assistant

## 2019-12-10 ENCOUNTER — Other Ambulatory Visit: Payer: Self-pay

## 2019-12-10 DIAGNOSIS — L89314 Pressure ulcer of right buttock, stage 4: Secondary | ICD-10-CM | POA: Insufficient documentation

## 2019-12-10 DIAGNOSIS — G8222 Paraplegia, incomplete: Secondary | ICD-10-CM | POA: Insufficient documentation

## 2019-12-10 DIAGNOSIS — F17218 Nicotine dependence, cigarettes, with other nicotine-induced disorders: Secondary | ICD-10-CM | POA: Insufficient documentation

## 2019-12-10 DIAGNOSIS — L89892 Pressure ulcer of other site, stage 2: Secondary | ICD-10-CM | POA: Diagnosis not present

## 2019-12-10 DIAGNOSIS — L89154 Pressure ulcer of sacral region, stage 4: Secondary | ICD-10-CM | POA: Insufficient documentation

## 2019-12-10 DIAGNOSIS — L89324 Pressure ulcer of left buttock, stage 4: Secondary | ICD-10-CM | POA: Insufficient documentation

## 2019-12-10 DIAGNOSIS — L89893 Pressure ulcer of other site, stage 3: Secondary | ICD-10-CM | POA: Insufficient documentation

## 2019-12-10 NOTE — Progress Notes (Addendum)
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 not seem to improve then he may need to go to the ER for further evaluation and treatment obviously if anything worsens that would be the case as well. We will see  patient back for reevaluation in 4 weeks here in the clinic. If anything worsens or changes patient will contact our office for additional recommendations. Electronic Signature(s) Signed: 12/10/2019 11:02:38 AM By: [name] Keeler PA-C Entered By: [name] Keeler on 12/10/2019 11:02:38 -------------------------------------------------------------------------------- SuperBill Details Patient Name: Date of Service: [name], [name] 12/10/2019 Medical Record [id number] Patient Account Number: [id number] Date of Birth/Sex: Treating RN: 02/29/1966 (54 y.o. 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, incomplete F17.218 Nicotine dependence, cigarettes, with other nicotine-induced disorders Facility Procedures CPT4 Code: [id number] Description: [id number] - WOUND CARE VISIT-LEV 5 EST PT Modifier: Quantity: 1 Physician Procedures CPT4 Code: [id number] Description: [id number] - WC PHYS LEVEL 4 - EST PT ICD-10 Diagnosis Description L89.324 Pressure ulcer of left buttock, stage 4 L89.314 Pressure ulcer of right buttock, stage 4 L89.154 Pressure ulcer of sacral region, stage 4 L89.892 Pressure ulcer of other  site, stage 2 Modifier: Quantity: 1 Electronic Signature(s) Signed: 12/10/2019 11:02:52  AM By: [name] Keeler PA-C Entered By: [name] Keeler on 12/10/2019 11:02:51

## 2020-01-07 ENCOUNTER — Encounter (HOSPITAL_BASED_OUTPATIENT_CLINIC_OR_DEPARTMENT_OTHER): Payer: Medicare Other | Admitting: Physician Assistant

## 2020-01-14 ENCOUNTER — Encounter (HOSPITAL_BASED_OUTPATIENT_CLINIC_OR_DEPARTMENT_OTHER): Payer: Medicare Other | Attending: Physician Assistant | Admitting: Physician Assistant

## 2020-01-14 ENCOUNTER — Other Ambulatory Visit: Payer: Self-pay

## 2020-01-14 DIAGNOSIS — L89893 Pressure ulcer of other site, stage 3: Secondary | ICD-10-CM | POA: Diagnosis not present

## 2020-01-14 DIAGNOSIS — L89892 Pressure ulcer of other site, stage 2: Secondary | ICD-10-CM | POA: Diagnosis not present

## 2020-01-14 DIAGNOSIS — G8222 Paraplegia, incomplete: Secondary | ICD-10-CM | POA: Insufficient documentation

## 2020-01-14 DIAGNOSIS — L89154 Pressure ulcer of sacral region, stage 4: Secondary | ICD-10-CM | POA: Insufficient documentation

## 2020-01-14 DIAGNOSIS — L89324 Pressure ulcer of left buttock, stage 4: Secondary | ICD-10-CM | POA: Insufficient documentation

## 2020-01-14 DIAGNOSIS — L89314 Pressure ulcer of right buttock, stage 4: Secondary | ICD-10-CM | POA: Diagnosis not present

## 2020-01-14 DIAGNOSIS — F17218 Nicotine dependence, cigarettes, with other nicotine-induced disorders: Secondary | ICD-10-CM | POA: Diagnosis not present

## 2020-01-14 NOTE — Progress Notes (Signed)
[name], [name] ([id number]) Visit Report for 12/10/2019 Arrival Information Details Patient Name: Date of Service: [name], [name] 12/10/2019 9:00 AM Medical Record [id number] Patient Account Number: [id number] Date of Birth/Sex: Treating RN: 1966/04/03 (54 y.o. [name] Primary Care Sosha Shepherd: [name] Other Clinician: Referring Islah Eve: Treating Tandy Lewin/Extender:Stone III, Delray Alt, Fuller Plan in Treatment: 21 Visit Information History Since Last Visit Added or deleted any medications: No Patient Arrived: Wheel Chair Any new allergies or adverse reactions: No Arrival Time: 09:52 Had a fall or experienced change in No activities of daily living that may affect Accompanied By: caregiver risk of falls: Transfer Assistance: [name] Signs or symptoms of abuse/neglect since last No Patient Identification Verified: Yes visito Secondary Verification Process Completed: Yes Hospitalized since last visit: No Patient Requires Transmission-Based No Implantable device outside of the clinic excluding No Precautions: cellular tissue based products placed in the center Patient Has Alerts: No since last visit: Has Dressing in Place as Prescribed: Yes Pain Present Now: Yes Electronic Signature(s) Signed: 01/14/2020 9:21:08 AM By: Sandre Kitty Entered By: Sandre Kitty on 12/10/2019 09:53:34 -------------------------------------------------------------------------------- Clinic Level of Care Assessment Details Patient Name: Date of Service: [name], [name] 12/10/2019 9:00 AM Medical Record [id number] Patient Account Number: [id number] Date of Birth/Sex: Treating RN: 07-27-1966 (54 y.o. [name] Primary Care Giovanie Lefebre: [name] Other Clinician: Referring Kohl Polinsky: Treating Adasia Hoar/Extender:Stone III, Delray Alt, Fuller Plan in Treatment: 59 Clinic Level of Care Assessment Items TOOL 4 Quantity Score []  - Use when only  an EandM is performed on FOLLOW-UP visit 0 ASSESSMENTS - Nursing Assessment / Reassessment X - Reassessment of Co-morbidities (includes updates in patient status) 1 10 X - Reassessment of Adherence to Treatment Plan 1 5 ASSESSMENTS - Wound and Skin Assessment / Reassessment []  - Simple Wound Assessment / Reassessment - one wound 0 X - Complex Wound Assessment / Reassessment - multiple wounds 7 5 []  - Dermatologic / Skin Assessment (not related to wound area) 0 ASSESSMENTS - Focused Assessment []  - Circumferential Edema Measurements - multi extremities 0 []  - Nutritional Assessment / Counseling / Intervention 0 X - Lower Extremity Assessment (monofilament, tuning fork, pulses) 1 5 []  - Peripheral Arterial Disease Assessment (using hand held doppler) 0 ASSESSMENTS - Ostomy and/or Continence Assessment and Care []  - Incontinence Assessment and Management 0 []  - Ostomy Care Assessment and Management (repouching, etc.) 0 PROCESS - Coordination of Care X - Simple Patient / Family Education for ongoing care 1 15 []  - Complex (extensive) Patient / Family Education for ongoing care 0 X - Staff obtains Programmer, systems, Records, Test Results / Process Orders 1 10 []  - Staff telephones HHA, Nursing Homes / Clarify orders / etc 0 []  - Routine Transfer to another Facility (non-emergent condition) 0 []  - Routine Hospital Admission (non-emergent condition) 0 []  - New Admissions / Biomedical engineer / Ordering NPWT, Apligraf, etc. 0 []  - Emergency Hospital Admission (emergent condition) 0 X - Simple Discharge Coordination 1 10 []  - Complex (extensive) Discharge Coordination 0 PROCESS - Special Needs []  - Pediatric / Minor Patient Management 0 []  - Isolation Patient Management 0 []  - Hearing / Language / Visual special needs 0 []  - Assessment of Community assistance (transportation, D/C planning, etc.) 0 []  - Additional assistance / Altered mentation 0 []  - Support Surface(s) Assessment (bed, cushion,  seat, etc.) 0 INTERVENTIONS - Wound Cleansing / Measurement []  - Simple Wound Cleansing - one wound 0 X - Complex Wound Cleansing - multiple wounds 7 5 X - Wound Imaging (photographs -  any number of wounds) 1 5 []  - Wound Tracing (instead of photographs) 0 []  - Simple Wound Measurement - one wound 0 X - Complex Wound Measurement - multiple wounds 7 5 INTERVENTIONS - Wound Dressings X - Small Wound Dressing one or multiple wounds 1 10 X - Medium Wound Dressing one or multiple wounds 2 15 []  - Large Wound Dressing one or multiple wounds 0 X - Application of Medications - topical 1 5 []  - Application of Medications - injection 0 INTERVENTIONS - Miscellaneous []  - External ear exam 0 []  - Specimen Collection (cultures, biopsies, blood, body fluids, etc.) 0 []  - Specimen(s) / Culture(s) sent or taken to Lab for analysis 0 []  - Patient Transfer (multiple staff / Civil Service fast streamer / Similar devices) 0 []  - Simple Staple / Suture removal (25 or less) 0 []  - Complex Staple / Suture removal (26 or more) 0 []  - Hypo / Hyperglycemic Management (close monitor of Blood Glucose) 0 []  - Ankle / Brachial Index (ABI) - do not check if billed separately 0 X - Vital Signs 1 5 Has the patient been seen at the hospital within the last three years: Yes Total Score: 215 Level Of Care: New/Established - Level 5 Electronic Signature(s) Signed: 12/10/2019 5:43:44 PM By: Baruch Gouty RN, BSN Entered By: Baruch Gouty on 12/10/2019 10:56:10 -------------------------------------------------------------------------------- Encounter Discharge Information Details Patient Name: Date of Service: [name], CRANMER 12/10/2019 9:00 AM Medical Record [id number] Patient Account Number: [id number] Date of Birth/Sex: Treating RN: Jul 10, 1966 (54 y.o. [name] Primary Care Lawonda Pretlow: [name] Other Clinician: Referring [name]: Treating [name]/Extender:Stone III, Delray Alt, Fuller Plan in  Treatment: 123 Encounter Discharge Information Items Discharge Condition: Stable Ambulatory Status: Wheelchair Discharge Destination: Home Transportation: Private Auto Accompanied By: self Schedule Follow-up Appointment: Yes Clinical Summary of Care: Patient Declined Electronic Signature(s) Signed: 12/10/2019 5:35:23 PM By: Carlene Coria RN Entered By: Carlene Coria on 12/10/2019 11:17:24 -------------------------------------------------------------------------------- Lower Extremity Assessment Details Patient Name: Date of Service: [name], HEMP 12/10/2019 9:00 AM Medical Record [id number] Patient Account Number: [id number] Date of Birth/Sex: Treating RN: 09-Apr-1966 (54 y.o. [name] Primary Care Ronit Marczak: [name] Other Clinician: Referring Narcissa Melder: Treating Cashton Hosley/Extender:Stone III, Delray Alt, Dwight Weeks in Treatment: 123 Edema Assessment Assessed: [Left: Yes] [Right: No] Edema: [Left: Yes] [Right: Yes] Calf Left: Right: Point of Measurement: 28 cm From Medial Instep 34 cm 30 cm Ankle Left: Right: Point of Measurement: 9 cm From Medial Instep 30 cm 30.5 cm Electronic Signature(s) Signed: 12/10/2019 5:34:34 PM By: Deon Pilling Entered By: Deon Pilling on 12/10/2019 10:13:08 -------------------------------------------------------------------------------- [place] Details Patient Name: Date of Service: MAKOY, WYER 12/10/2019 9:00 AM Medical Record [id number] Patient Account Number: [id number] Date of Birth/Sex: Treating RN: 11-Oct-1965 (54 y.o. [name] Primary Care Betzayda Braxton: [name] Other Clinician: Referring Rayner Erman: Treating Stephone Gum/Extender:Stone III, Delray Alt, Fuller Plan in Treatment: 123 Active Inactive Pressure Nursing Diagnoses: Knowledge deficit related to causes and risk factors for pressure ulcer development Knowledge deficit related to management of pressures  ulcers Potential for impaired tissue integrity related to pressure, friction, moisture, and shear Goals: Patient will remain free from development of additional pressure ulcers Date Inactivated: 08/14/2018 Target Resolution Date Initiated: 12/12/2017 Date: 08/21/2018 Goal Status: Unmet Unmet Reason: hospitalized Patient will remain free of pressure ulcers Date Initiated: 12/12/2017 Date Inactivated: 02/06/2018 Target Resolution Date: 02/06/2018 Unmet Reason: pt Goal Status: Unmet noncompliant wth offloading Patient/caregiver will verbalize understanding of pressure ulcer management Date Initiated: 12/12/2017 Target Resolution Date: 12/31/2019 Goal Status: Active Interventions:  Assess: immobility, friction, shearing, incontinence upon admission and as needed Assess offloading mechanisms upon admission and as needed Assess potential for pressure ulcer upon admission and as needed Notes: Wound/Skin Impairment Nursing Diagnoses: Impaired tissue integrity Goals: Patient/caregiver will verbalize understanding of skin care regimen Date Initiated: 03/06/2018 Target Resolution Date: 12/31/2019 Goal Status: Active Ulcer/skin breakdown will heal within 14 weeks Date Initiated: 07/31/2017 Date Inactivated: 01/09/2018 Target Resolution Date: 11/30/2017 Unmet Goal Status: Unmet Reason: noncompliant with offloading Interventions: Assess patient/caregiver ability to perform ulcer/skin care regimen upon admission and as needed Notes: Electronic Signature(s) Signed: 12/10/2019 5:43:44 PM By: Baruch Gouty RN, BSN Entered By: Baruch Gouty on 12/10/2019 10:53:42 -------------------------------------------------------------------------------- Pain Assessment Details Patient Name: Date of Service: RAHN, MILONE 12/10/2019 9:00 AM Medical Record [id number] Patient Account Number: [id number] Date of Birth/Sex: Treating RN: Jul 03, 1966 (54 y.o. [name] Primary Care Jonavin Seder:  [name] Other Clinician: Referring Arizona Nordquist: Treating Jaquavious Mercer/Extender:Stone III, Delray Alt, Fuller Plan in Treatment: 123 Active Problems Location of Pain Severity and Description of Pain Patient Has Paino Yes Site Locations Rate the pain. Current Pain Level: 8 Pain Management and Medication Current Pain Management: Electronic Signature(s) Signed: 12/10/2019 5:43:44 PM By: Baruch Gouty RN, BSN Signed: 01/14/2020 9:21:08 AM By: Sandre Kitty Entered By: Sandre Kitty on 12/10/2019 09:54:21 -------------------------------------------------------------------------------- Patient/Caregiver Education Details Patient Name: Date of Service: KIRILL, BETIT 3/10/2021andnbsp9:00 AM Medical Record [id number] Patient Account Number: [id number] Date of Birth/Gender: Treating RN: 03/07/1966 (54 y.o. [name] Primary Care Physician: [name] Other Clinician: Referring Physician: Treating Physician/Extender:Stone III, Delray Alt, Fuller Plan in Treatment: 123 Education Assessment Education Provided To: Patient Education Topics Provided Pressure: Methods: Explain/Verbal Responses: Reinforcements needed, State content correctly Wound/Skin Impairment: Methods: Explain/Verbal Responses: Reinforcements needed, State content correctly Electronic Signature(s) Signed: 12/10/2019 5:43:44 PM By: Baruch Gouty RN, BSN Entered By: Baruch Gouty on 12/10/2019 10:54:33 -------------------------------------------------------------------------------- Wound Assessment Details Patient Name: Date of Service: PLUMMER, LAPRADE 12/10/2019 9:00 AM Medical Record [id number] Patient Account Number: [id number] Date of Birth/Sex: Treating RN: Apr 08, 1966 (54 y.o. [name] Primary Care Katrisha Segall: [name] Other Clinician: Referring Christobal Morado: Treating Keiondra Brookover/Extender:Stone III, Delray Alt, Fuller Plan in Treatment: 123 Wound  Status Wound Number: 17 Primary Pressure Ulcer Etiology: Wound Location: Right Ischial Tuberosity Wound Open Wounding Event: Pressure Injury Status: Date Acquired: 10/02/2013 Comorbid Anemia, Hypertension, Colitis, Weeks Of Treatment: 123 History: Osteomyelitis, Paraplegia Clustered Wound: No Wound Measurements Length: (cm) 0.8 Width: (cm) 0.4 Depth: (cm) 0.6 Area: (cm) 0.251 Volume: (cm) 0.151 % Reduction in Area: 98.9% % Reduction in Volume: 99.6% Epithelialization: Small (1-33%) Tunneling: Yes Position (o'clock): 5 Maximum Distance: (cm) 4.5 Wound Description Classification: Category/Stage IV Wound Margin: Fibrotic scar, thickened scar Exudate Amount: Medium Exudate Type: Serosanguineous Exudate Color: red, brown Wound Bed Granulation Amount: Large (67-100%) Granulation Quality: Pink, Pale Necrotic Amount: Small (1-33%) Necrotic Quality: Adherent Slough Foul Odor After Cleansing: No Slough/Fibrino Yes Exposed Structure Fascia Exposed: No Fat Layer (Subcutaneous Tissue) Exposed: Yes Tendon Exposed: No Muscle Exposed: No Joint Exposed: No Bone Exposed: No Electronic Signature(s) Signed: 12/10/2019 5:34:34 PM By: Deon Pilling Entered By: Deon Pilling on 12/10/2019 10:15:26 -------------------------------------------------------------------------------- Wound Assessment Details Patient Name: Date of Service: HAROUT, AROYO 12/10/2019 9:00 AM Medical Record [id number] Patient Account Number: [id number] Date of Birth/Sex: Treating RN: 04-26-1966 (54 y.o. [name] Primary Care Dung Prien: [name] Other Clinician: Referring Shequita Peplinski: Treating Jurney Overacker/Extender:Stone III, Delray Alt, Fuller Plan in Treatment: 123 Wound Status Wound Number: 19 Primary Pressure Ulcer Etiology: Wound Location: Sacrum Wound Open Wounding Event: Pressure Injury Status:  Date Acquired: 10/02/2013 Comorbid Anemia, Hypertension, Colitis, Weeks Of  Treatment: 123 History: Osteomyelitis, Paraplegia Clustered Wound: No Photos Wound Measurements Length: (cm) 1 % Reducti Width: (cm) 1 % Reducti Depth: (cm) 1.1 Epithelia Area: (cm) 0.785 Tunnelin Volume: (cm) 0.864 Undermin Wound Description Classification: Category/Stage IV Wound Margin: Fibrotic scar, thickened scar Exudate Amount: Medium Exudate Type: Serosanguineous Exudate Color: red, brown Wound Bed Granulation Amount: Large (67-100%) Granulation Quality: Red Necrotic Amount: [name] Present (0%) Foul Odor After Cleansing: No Slough/Fibrino No Exposed Structure Fascia Exposed: No Fat Layer (Subcutaneous Tissue) Exposed: Yes Tendon Exposed: No Muscle Exposed: No Joint Exposed: No Bone Exposed: No on in Area: 63.2% on in Volume: 68.9% lization: Medium (34-66%) g: No ing: No Electronic Signature(s) Signed: 12/12/2019 4:47:30 PM By: Mikeal Hawthorne EMT/HBOT Signed: 12/12/2019 5:31:10 PM By: Baruch Gouty RN, BSN Previous Signature: 12/10/2019 5:34:34 PM Version By: Deon Pilling Entered By: Mikeal Hawthorne on 12/12/2019 13:16:20 -------------------------------------------------------------------------------- Wound Assessment Details Patient Name: Date of Service: JAMESEDWARD, NEELS 12/10/2019 9:00 AM Medical Record [id number] Patient Account Number: [id number] Date of Birth/Sex: Treating RN: Mar 11, 1966 (54 y.o. [name] Primary Care Kalenna Millett: [name] Other Clinician: Referring Lenyx Boody: Treating Jylan Loeza/Extender:Stone III, Delray Alt, Fuller Plan in Treatment: 123 Wound Status Wound Number: 21 Primary Pressure Ulcer Etiology: Wound Location: Right Ischium - Medial Wound Open Wounding Event: Gradually Appeared Wounding Event: Gradually Appeared Status: Date Acquired: 10/02/2012 Comorbid Anemia, Hypertension, Colitis, Weeks Of Treatment: 88 History: Osteomyelitis, Paraplegia Clustered Wound: No Photos Wound Measurements Length:  (cm) 0.5 Width: (cm) 0.3 Depth: (cm) 2.2 Area: (cm) 0.118 Volume: (cm) 0.259 Wound Description Classification: Category/Stage II Wound Margin: Well defined, not attached Exudate Amount: Small Exudate Type: Serosanguineous Exudate Color: red, brown Wound Bed Granulation Amount: Large (67-100%) Granulation Quality: Pink Necrotic Amount: [name] Present (0%) After Cleansing: No brino No Exposed Structure osed: No (Subcutaneous Tissue) Exposed: Yes osed: No osed: No sed: No ed: No % Reduction in Area: 95.8% % Reduction in Volume: 95.4% Epithelialization: Small (1-33%) Tunneling: No Undermining: No Foul Odor Slough/Fi Fascia Exp Fat Layer Tendon Exp Muscle Exp Joint Expo Bone Expos Electronic Signature(s) Signed: 12/12/2019 4:47:30 PM By: Mikeal Hawthorne EMT/HBOT Signed: 12/12/2019 5:31:10 PM By: Baruch Gouty RN, BSN Previous Signature: 12/10/2019 5:34:34 PM Version By: Deon Pilling Entered By: Mikeal Hawthorne on 12/12/2019 13:17:50 -------------------------------------------------------------------------------- Wound Assessment Details Patient Name: Date of Service: CESARE, [place] 12/10/2019 9:00 AM Medical Record [id number] Patient Account Number: [id number] Date of Birth/Sex: Treating RN: 1966-06-15 (54 y.o. [name] Primary Care Nikko Quast: [name] Other Clinician: Referring Ailsa Mireles: Treating Tryson Lumley/Extender:Stone III, Delray Alt, Fuller Plan in Treatment: 123 Wound Status Wound Number: 22 Primary Pressure Ulcer Etiology: Wound Location: Left Ischium Wound Open Wounding Event: Gradually Appeared Status: Date Acquired: 10/02/2013 Comorbid Anemia, Hypertension, Colitis, Weeks Of Treatment: 88 History: Osteomyelitis, Paraplegia Clustered Wound: No Photos Wound Measurements Length: (cm) 2 Width: (cm) 0.5 Depth: (cm) 3.5 Area: (cm) 0.785 Volume: (cm) 2.749 Wound Description Classification: Category/Stage II Wound  Margin: Epibole Exudate Amount: Medium Exudate Type: Serosanguineous Exudate Color: red, brown Wound Bed Granulation Amount: Large (67-100%) Granulation Quality: Pink Necrotic Amount: [name] Present (0%) After Cleansing: No brino No Exposed Structure osed: No (Subcutaneous Tissue) Exposed: Yes osed: No osed: No sed: No ed: No % Reduction in Area: -85.1% % Reduction in Volume: -131.4% Epithelialization: Small (1-33%) Tunneling: No Undermining: No Foul Odor Slough/Fi Fascia Exp Fat Layer Tendon Exp Muscle Exp Joint Expo Bone Expos Electronic Signature(s) Signed: 12/12/2019 4:47:30 PM By: Mikeal Hawthorne EMT/HBOT Signed: 12/12/2019 5:31:10 PM  By: Baruch Gouty RN, BSN Previous Signature: 12/10/2019 5:34:34 PM Version By: Deon Pilling Entered By: Mikeal Hawthorne on 12/12/2019 13:17:30 -------------------------------------------------------------------------------- Wound Assessment Details Patient Name: Date of Service: [name], PYO 12/10/2019 9:00 AM Medical Record [id number] Patient Account Number: [id number] Date of Birth/Sex: Treating RN: 1966-07-09 (54 y.o. [name] Primary Care Dearl Rudden: [name] Other Clinician: Referring Cyndi Montejano: Treating Nicanor Mendolia/Extender:Stone III, Delray Alt, Fuller Plan in Treatment: 123 Wound Status Wound Number: 23 Primary Pressure Ulcer Etiology: Wound Location: Right Gluteus Wound Open Wounding Event: Gradually Appeared Status: Date Acquired: 09/18/2018 Comorbid Anemia, Hypertension, Colitis, Weeks Of Treatment: 61 History: Osteomyelitis, Paraplegia Clustered Wound: No Photos Wound Measurements Length: (cm) 1.5 Width: (cm) 2.5 Depth: (cm) 1.5 Area: (cm) 2.945 Volume: (cm) 4.418 Wound Description Classification: Category/Stage II Wound Margin: Well defined, not attached Exudate Amount: Medium Exudate Type: Serosanguineous Exudate Color: red, brown Wound Bed Granulation Amount: Large  (67-100%) Granulation Quality: Pink Necrotic Amount: [name] Present (0%) After Cleansing: No brino No Exposed Structure osed: No (Subcutaneous Tissue) Exposed: Yes osed: No osed: No sed: No ed: No % Reduction in Area: -751.2% % Reduction in Volume: -356.4% Epithelialization: Medium (34-66%) Tunneling: No Undermining: No Foul Odor Slough/Fi Fascia Exp Fat Layer Tendon Exp Muscle Exp Joint Expo Bone Expos Electronic Signature(s) Signed: 12/12/2019 4:47:30 PM By: Mikeal Hawthorne EMT/HBOT Signed: 12/12/2019 5:31:10 PM By: Baruch Gouty RN, BSN Previous Signature: 12/10/2019 5:34:34 PM Version By: Deon Pilling Entered By: Mikeal Hawthorne on 12/12/2019 13:18:14 -------------------------------------------------------------------------------- Wound Assessment Details Patient Name: Date of Service: [name], ARRICK 12/10/2019 9:00 AM Medical Record [id number] Patient Account Number: [id number] Date of Birth/Sex: Treating RN: Sep 25, 1966 (54 y.o. [name] Primary Care Anagabriela Jokerst: [name] Other Clinician: Referring Kyliah Deanda: Treating Natalynn Pedone/Extender:Stone III, Delray Alt, Fuller Plan in Treatment: 123 Wound Status Wound Number: 24 Primary Pressure Ulcer Etiology: Wound Location: Right Scrotum Wound Open Wounding Event: Gradually Appeared Status: Date Acquired: 08/06/2019 Comorbid Anemia, Hypertension, Colitis, Weeks Of Treatment: 18 History: Osteomyelitis, Paraplegia Clustered Wound: No Photos Wound Measurements Length: (cm) 0.3 Width: (cm) 2 Depth: (cm) 0.1 Area: (cm) 0.471 Volume: (cm) 0.047 Wound Description Classification: Category/Stage III Wound Margin: Flat and Intact Exudate Amount: Medium Exudate Type: Serosanguineous Exudate Color: red, brown Wound Bed Granulation Amount: Large (67-100%) Granulation Quality: Red Necrotic Amount: [name] Present (0%) ter Cleansing: No no No Exposed Structure d: No bcutaneous Tissue)  Exposed: Yes d: No d: No : No No % Reduction in Area: 82.4% % Reduction in Volume: 82.5% Epithelialization: Medium (34-66%) Tunneling: No Undermining: No Foul Odor Af Slough/Fibri Fascia Expose Fat Layer (Su Tendon Expose Muscle Expose Joint Exposed Bone Exposed: Electronic Signature(s) Signed: 12/12/2019 4:47:30 PM By: Mikeal Hawthorne EMT/HBOT Signed: 12/12/2019 5:31:10 PM By: Baruch Gouty RN, BSN Previous Signature: 12/10/2019 5:34:34 PM Version By: Deon Pilling Entered By: Mikeal Hawthorne on 12/12/2019 13:16:42 -------------------------------------------------------------------------------- Wound Assessment Details Patient Name: Date of Service: [name], BORING 12/10/2019 9:00 AM Medical Record [id number] Patient Account Number: [id number] Date of Birth/Sex: Treating RN: [birth year] (54 y.o. [name] Primary Care Garlan Drewes: [name] Other Clinician: Referring Aviela Blundell: Treating Shyleigh Daughtry/Extender:Stone III, Delray Alt, Fuller Plan in Treatment: 123 Wound Status Wound Number: 26 Primary Pressure Ulcer Etiology: Wound Location: Left Foot - Dorsal Wound Open Wounding Event: Shear/Friction Status: Date Acquired: 09/24/2019 Comorbid Anemia, Hypertension, Colitis, Weeks Of Treatment: 9 History: Osteomyelitis, Paraplegia Clustered Wound: No Photos Wound Measurements Length: (cm) 0.5 % Reductio Width: (cm) 2.7 % Reductio Depth: (cm) 0.1 Epithelial Area: (cm) 1.06 Tunneling Volume: (cm) 0.106 Undermini Wound Description Classification: Category/Stage III  Wound Margin: Distinct, outline attached Exudate Amount: Medium Exudate Type: Serosanguineous Exudate Color: red, brown Wound Bed Granulation Amount: Large (67-100%) Granulation Quality: Pink Necrotic Amount: Small (1-33%) Necrotic Quality: Adherent Slough Foul Odor After Cleansing: No Slough/Fibrino Yes Exposed Structure Fascia Exposed: No Fat Layer (Subcutaneous Tissue)  Exposed: Yes Tendon Exposed: No Muscle Exposed: No Joint Exposed: No Bone Exposed: No n in Area: 62.5% n in Volume: 81.2% ization: Medium (34-66%) : No ng: No Electronic Signature(s) Signed: 12/12/2019 4:47:30 PM By: Mikeal Hawthorne EMT/HBOT Signed: 12/12/2019 5:31:10 PM By: Baruch Gouty RN, BSN Previous Signature: 12/10/2019 5:34:34 PM Version By: Deon Pilling Entered By: Mikeal Hawthorne on 12/12/2019 11:47:02 -------------------------------------------------------------------------------- Vitals Details Patient Name: Date of Service: [name], BURCK 12/10/2019 9:00 AM Medical Record [id number] Patient Account Number: [id number] Date of Birth/Sex: Treating RN: 1966/02/07 (54 y.o. [name] Primary Care Woods Gangemi: [name] Other Clinician: Referring Aragorn Recker: Treating Lesli Issa/Extender:Stone III, Delray Alt, Fuller Plan in Treatment: 123 Vital Signs Time Taken: 09:53 Temperature (F): 98.4 Height (in): 72 Pulse (bpm): 84 Weight (lbs): 215 Respiratory Rate (breaths/min): 18 Body Mass Index (BMI): 29.2 Blood Pressure (mmHg): 124/68 Reference Range: 80 - 120 mg / dl Electronic Signature(s) Signed: 01/14/2020 9:21:08 AM By: Sandre Kitty Entered By: Sandre Kitty on 12/10/2019 09:54:03

## 2020-01-14 NOTE — Progress Notes (Addendum)
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 contact our office for additional recommendations. Electronic Signature(s) Signed: 01/14/2020 9:24:26 AM By: [name] Keeler  PA-C Entered By: [name] Keeler on 01/14/2020 09:24:26 -------------------------------------------------------------------------------- SuperBill Details Patient Name: Date of Service: [name], [name] 01/14/2020 Medical Record [id number] Patient Account Number: [id number] Date of Birth/Sex: Treating RN: 01/14/1966 (54 y.o. 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, incomplete F17.218 Nicotine dependence, cigarettes, with other nicotine-induced disorders Facility Procedures CPT4 Code: [id number] Description: [id number] - WOUND CARE VISIT-LEV 5 EST PT Modifier: Quantity: 1 Physician Procedures CPT4 Code: [id number] Description: [id number] - WC PHYS LEVEL 4 - EST PT ICD-10 Diagnosis Description L89.324 Pressure ulcer of left buttock, stage 4 L89.314 Pressure ulcer of right buttock, stage 4 L89.154 Pressure ulcer of sacral region, stage 4 L89.892 Pressure ulcer of other  site, stage 2 Modifier: Quantity: 1 Electronic Signature(s)  Signed: 01/14/2020 9:25:01 AM By: [name] Keeler PA-C Entered By: [name] Keeler on 01/14/2020 09:25:00

## 2020-02-11 ENCOUNTER — Encounter (HOSPITAL_BASED_OUTPATIENT_CLINIC_OR_DEPARTMENT_OTHER): Payer: Medicare Other | Admitting: Physician Assistant

## 2020-02-18 ENCOUNTER — Ambulatory Visit (HOSPITAL_BASED_OUTPATIENT_CLINIC_OR_DEPARTMENT_OTHER): Payer: Medicare Other | Admitting: Physician Assistant

## 2020-02-23 NOTE — Progress Notes (Signed)
[name], [name] ([id number]) Visit Report for 01/14/2020 Arrival Information Details Patient Name: Date of Service: [name], [name] 01/14/2020 9:00 A M Medical Record Number: [id number] Patient Account Number: [id number] Date of Birth/Sex: Treating RN: 1966/03/07 (54 y.o. [name] Primary Care [name]: [name] Other Clinician: Referring [name]: Treating [name]/Extender: [name] in Treatment: 128 Visit Information History Since Last Visit Added or deleted any medications: No Patient Arrived: Wheel Chair Any new allergies or adverse reactions: No Arrival Time: 08:12 Had a fall or experienced change in No Accompanied By: alone activities of daily living that may affect Transfer Assistance: None risk of falls: Patient Identification Verified: Yes Signs or symptoms of abuse/neglect since last visito No Secondary Verification Process Completed: Yes Hospitalized since last visit: No Patient Requires Transmission-Based Precautions: No Implantable device outside of the clinic excluding No Patient Has Alerts: No cellular tissue based products placed in the center since last visit: Has Dressing in Place as Prescribed: Yes Pain Present Now: Yes Electronic Signature(s) Signed: 01/14/2020 5:43:43 PM By: Levan Hurst RN, BSN Entered By: Levan Hurst on 01/14/2020 08:12:39 -------------------------------------------------------------------------------- Clinic Level of Care Assessment Details Patient Name: Date of Service: [name] RA [name], [name] 01/14/2020 9:00 [place] Record Number: [id number] Patient Account Number: [id number] Date of Birth/Sex: Treating RN: 03/31/1966 (54 y.o. [name] Primary Care Keitra Carusone: [name] Other Clinician: Referring [name]: Treating [name]/Extender: [name] in Treatment: 128 Clinic Level of Care Assessment Items TOOL 4 Quantity Score []  - 0 Use  when only an EandM is performed on FOLLOW-UP visit ASSESSMENTS - Nursing Assessment / Reassessment X- 1 10 Reassessment of Co-morbidities (includes updates in patient status) X- 1 5 Reassessment of Adherence to Treatment Plan ASSESSMENTS - Wound and Skin A ssessment / Reassessment []  - 0 Simple Wound Assessment / Reassessment - one wound X- 7 5 Complex Wound Assessment / Reassessment - multiple wounds []  - 0 Dermatologic / Skin Assessment (not related to wound area) ASSESSMENTS - Focused Assessment []  - 0 Circumferential Edema Measurements - multi extremities []  - 0 Nutritional Assessment / Counseling / Intervention []  - 0 Lower Extremity Assessment (monofilament, tuning fork, pulses) []  - 0 Peripheral Arterial Disease Assessment (using hand held doppler) ASSESSMENTS - Ostomy and/or Continence Assessment and Care []  - 0 Incontinence Assessment and Management []  - 0 Ostomy Care Assessment and Management (repouching, etc.) PROCESS - Coordination of Care X - Simple Patient / Family Education for ongoing care 1 15 []  - 0 Complex (extensive) Patient / Family Education for ongoing care X- 1 10 Staff obtains Consents, Records, T Results / Process Orders est []  - 0 Staff telephones HHA, Nursing Homes / Clarify orders / etc []  - 0 Routine Transfer to another Facility (non-emergent condition) []  - 0 Routine Hospital Admission (non-emergent condition) []  - 0 New Admissions / Biomedical engineer / Ordering NPWT Apligraf, etc. , []  - 0 Emergency Hospital Admission (emergent condition) X- 1 10 Simple Discharge Coordination []  - 0 Complex (extensive) Discharge Coordination PROCESS - Special Needs []  - 0 Pediatric / Minor Patient Management []  - 0 Isolation Patient Management []  - 0 Hearing / Language / Visual special needs []  - 0 Assessment of Community assistance (transportation, D/C planning, etc.) []  - 0 Additional assistance / Altered mentation []  - 0 Support  Surface(s) Assessment (bed, cushion, seat, etc.) INTERVENTIONS - Wound Cleansing / Measurement []  - 0 Simple Wound Cleansing - one wound X- 7 5 Complex Wound  Cleansing - multiple wounds X- 1 5 Wound Imaging (photographs - any number of wounds) []  - 0 Wound Tracing (instead of photographs) []  - 0 Simple Wound Measurement - one wound X- 7 5 Complex Wound Measurement - multiple wounds INTERVENTIONS - Wound Dressings X - Small Wound Dressing one or multiple wounds 5 10 []  - 0 Medium Wound Dressing one or multiple wounds []  - 0 Large Wound Dressing one or multiple wounds X- 1 5 Application of Medications - topical []  - 0 Application of Medications - injection INTERVENTIONS - Miscellaneous []  - 0 External ear exam []  - 0 Specimen Collection (cultures, biopsies, blood, body fluids, etc.) []  - 0 Specimen(s) / Culture(s) sent or taken to Lab for analysis []  - 0 Patient Transfer (multiple staff / Civil Service fast streamer / Similar devices) []  - 0 Simple Staple / Suture removal (25 or less) []  - 0 Complex Staple / Suture removal (26 or more) []  - 0 Hypo / Hyperglycemic Management (close monitor of Blood Glucose) []  - 0 Ankle / Brachial Index (ABI) - do not check if billed separately X- 1 5 Vital Signs Has the patient been seen at the hospital within the last three years: Yes Total Score: 220 Level Of Care: New/Established - Level 5 Electronic Signature(s) Signed: 01/14/2020 5:43:24 PM By: Baruch Gouty RN, BSN Entered By: Baruch Gouty on 01/14/2020 09:22:23 -------------------------------------------------------------------------------- Encounter Discharge Information Details Patient Name: Date of Service: [name] RA [name], [name] 01/14/2020 9:00 A M Medical Record Number: [id number] Patient Account Number: [id number] Date of Birth/Sex: Treating RN: 1966-09-26 (54 y.o. [name] Primary Care Keandra Medero: [name] Other Clinician: Referring [name]: Treating  [name]/Extender: [name] in Treatment: 128 Encounter Discharge Information Items Discharge Condition: Stable Ambulatory Status: Wheelchair Discharge Destination: Home Transportation: Private Auto Accompanied By: self Schedule Follow-up Appointment: Yes Clinical Summary of Care: Electronic Signature(s) Signed: 02/23/2020 1:31:05 PM By: Deon Pilling Entered By: Deon Pilling on 01/14/2020 10:05:52 -------------------------------------------------------------------------------- [place] Details Patient Name: Date of Service: [name] RA [place], [name] 01/14/2020 9:00 A M Medical Record Number: [id number] Patient Account Number: [id number] Date of Birth/Sex: Treating RN: [birth year] (54 y.o. Ulyses Amor, Vaughan Basta Primary Care Kjirsten Bloodgood: [name] Other Clinician: Referring Teagyn Fishel: Treating Hamzah Savoca/Extender: Seymour Bars Weeks in Treatment: 128 Active Inactive Pressure Nursing Diagnoses: Knowledge deficit related to causes and risk factors for pressure ulcer development Knowledge deficit related to management of pressures ulcers Potential for impaired tissue integrity related to pressure, friction, moisture, and shear Goals: Patient will remain free from development of additional pressure ulcers Date Initiated: 12/12/2017 Date Inactivated: 08/14/2018 Target Resolution Date: 08/21/2018 Goal Status: Unmet Unmet Reason: hospitalized Patient will remain free of pressure ulcers Date Initiated: 12/12/2017 Date Inactivated: 02/06/2018 Target Resolution Date: 02/06/2018 Unmet Reason: pt noncompliant wth Goal Status: Unmet offloading Patient/caregiver will verbalize understanding of pressure ulcer management Date Initiated: 12/12/2017 Target Resolution Date: 02/11/2020 Goal Status: Active Interventions: Assess: immobility, friction, shearing, incontinence upon admission and as needed Assess offloading mechanisms upon  admission and as needed Assess potential for pressure ulcer upon admission and as needed Notes: Wound/Skin Impairment Nursing Diagnoses: Impaired tissue integrity Goals: Patient/caregiver will verbalize understanding of skin care regimen Date Initiated: 03/06/2018 Target Resolution Date: 02/11/2020 Goal Status: Active Ulcer/skin breakdown will heal within 14 weeks Date Initiated: 07/31/2017 Date Inactivated: 01/09/2018 Target Resolution Date: 11/30/2017 Unmet Reason: noncompliant with Goal Status: Unmet offloading Interventions: Assess patient/caregiver ability to perform ulcer/skin care regimen upon admission and as needed Notes: Electronic  Signature(s) Signed: 01/14/2020 5:43:24 PM By: Baruch Gouty RN, BSN Entered By: Baruch Gouty on 01/14/2020 09:19:50 -------------------------------------------------------------------------------- Pain Assessment Details Patient Name: Date of Service: [name] RA [name], [name] 01/14/2020 9:00 A M Medical Record Number: [id number] Patient Account Number: [id number] Date of Birth/Sex: Treating RN: [birth year] (54 y.o. [name] Primary Care Jeramie Scogin: [name] Other Clinician: Referring Devan Danzer: Treating Ramiel Forti/Extender: Seymour Bars Weeks in Treatment: 128 Active Problems Location of Pain Severity and Description of Pain Patient Has Paino No Site Locations Pain Management and Medication Current Pain Management: Electronic Signature(s) Signed: 01/14/2020 5:43:43 PM By: Levan Hurst RN, BSN Entered By: Levan Hurst on 01/14/2020 08:15:36 -------------------------------------------------------------------------------- Patient/Caregiver Education Details Patient Name: Date of Service: [name] RA [name], [name] 4/14/2021andnbsp9:00 A M Medical Record Number: [id number] Patient Account Number: [id number] Date of Birth/Gender: Treating RN: 1965-11-11 (54 y.o. [name] Primary Care Physician:  [name] Other Clinician: Referring Physician: Treating Physician/Extender: [name] in Treatment: 128 Education Assessment Education Provided To: Patient Education Topics Provided Pressure: Methods: Explain/Verbal Responses: Reinforcements needed, State content correctly Wound/Skin Impairment: Methods: Explain/Verbal Responses: Reinforcements needed, State content correctly Electronic Signature(s) Signed: 01/14/2020 5:43:24 PM By: Baruch Gouty RN, BSN Entered By: Baruch Gouty on 01/14/2020 09:20:27 -------------------------------------------------------------------------------- Wound Assessment Details Patient Name: Date of Service: [name] RA [name], [name] 01/14/2020 9:00 A M Medical Record Number: [id number] Patient Account Number: [id number] Date of Birth/Sex: Treating RN: [birth year] (54 y.o. [name] Primary Care Chrishawna Farina: [name] Other Clinician: Referring Yamel Bale: Treating Kenyan Karnes/Extender: Seymour Bars Weeks in Treatment: 128 Wound Status Wound Number: 17 Primary Etiology: Pressure Ulcer Wound Location: Right Ischial Tuberosity Wound Status: Open Wounding Event: Pressure Injury Comorbid History: Anemia, Hypertension, Colitis, Osteomyelitis, Paraplegia Date Acquired: 10/02/2013 Weeks Of Treatment: 128 Clustered Wound: No Photos Photo Uploaded By: Mikeal Hawthorne on 01/15/2020 12:03:09 Wound Measurements Length: (cm) 0.5 Width: (cm) 0.5 Depth: (cm) 2.5 Area: (cm) 0.196 Volume: (cm) 0.491 % Reduction in Area: 99.1% % Reduction in Volume: 98.6% Epithelialization: Small (1-33%) Tunneling: No Undermining: No Wound Description Classification: Category/Stage IV Wound Margin: Fibrotic scar, thickened scar Exudate Amount: Medium Exudate Type: Serosanguineous Exudate Color: red, brown Foul Odor After Cleansing: No Slough/Fibrino Yes Wound Bed Granulation Amount: Large (67-100%)  Exposed Structure Granulation Quality: Pink Fascia Exposed: No Necrotic Amount: None Present (0%) Fat Layer (Subcutaneous Tissue) Exposed: Yes Tendon Exposed: No Muscle Exposed: No Joint Exposed: No Bone Exposed: No Treatment Notes Wound #17 (Right Ischial Tuberosity) 1. Cleanse With Wound Cleanser 3. Primary Dressing Applied Calcium Alginate Ag 4. Secondary Dressing ABD Pad 5. Secured With [facility]) Signed: 01/14/2020 5:43:43 PM By: Levan Hurst RN, BSN Entered By: Levan Hurst on 01/14/2020 08:35:12 -------------------------------------------------------------------------------- Wound Assessment Details Patient Name: Date of Service: [name] RA [name], [name] 01/14/2020 9:00 A M Medical Record Number: [id number] Patient Account Number: [id number] Date of Birth/Sex: Treating RN: 01-10-1966 (53 y.o. [name] Primary Care Arth Nicastro: [name] Other Clinician: Referring Purcell Jungbluth: Treating Amiayah Giebel/Extender: Seymour Bars Weeks in Treatment: 128 Wound Status Wound Number: 19 Primary Etiology: Pressure Ulcer Wound Location: Sacrum Wound Status: Open Wounding Event: Pressure Injury Comorbid History: Anemia, Hypertension, Colitis, Osteomyelitis, Paraplegia Date Acquired: 10/02/2013 Weeks Of Treatment: 128 Clustered Wound: No Photos Photo Uploaded By: Mikeal Hawthorne on 01/15/2020 12:03:34 Wound Measurements Length: (cm) 1 Width: (cm) 0.8 Depth: (cm) 1 Area: (cm) 0.628 Volume: (cm) 0.628 % Reduction in Area: 70.6% % Reduction in Volume: 77.4% Epithelialization: Medium (  34-66%) Tunneling: No Undermining: Yes Starting Position (o'clock): 12 Ending Position (o'clock): 12 Maximum Distance: (cm) 1.5 Wound Description Classification: Category/Stage IV Wound Margin: Fibrotic scar, thickened scar Exudate Amount: Medium Exudate Type: Serosanguineous Exudate Color: red, brown Foul Odor After Cleansing:  No Slough/Fibrino No Wound Bed Granulation Amount: Large (67-100%) Exposed Structure Granulation Quality: Red, Pink Fascia Exposed: No Necrotic Amount: None Present (0%) Fat Layer (Subcutaneous Tissue) Exposed: Yes Tendon Exposed: No Muscle Exposed: No Joint Exposed: No Bone Exposed: No Treatment Notes Wound #19 (Sacrum) 1. Cleanse With Wound Cleanser 3. Primary Dressing Applied Calcium Alginate Ag 4. Secondary Dressing ABD Pad 5. Secured With [facility]) Signed: 01/14/2020 5:43:43 PM By: Levan Hurst RN, BSN Entered By: Levan Hurst on 01/14/2020 08:33:00 -------------------------------------------------------------------------------- Wound Assessment Details Patient Name: Date of Service: [name] RA [place], [name] 01/14/2020 9:00 A M Medical Record Number: [id number] Patient Account Number: [id number] Date of Birth/Sex: Treating RN: [birth year] (54 y.o. [name] Primary Care Killian Schwer: [name] Other Clinician: Referring Jonai Weyland: Treating Mahmud Keithly/Extender: Seymour Bars Weeks in Treatment: 128 Wound Status Wound Number: 21 Primary Etiology: Pressure Ulcer Wound Location: Right, Medial Ischium Wound Status: Open Wounding Event: Gradually Appeared Comorbid History: Anemia, Hypertension, Colitis, Osteomyelitis, Paraplegia Date Acquired: 10/02/2012 Weeks Of Treatment: 93 Clustered Wound: No Photos Photo Uploaded By: Mikeal Hawthorne on 01/15/2020 11:13:48 Wound Measurements Length: (cm) 0.2 Width: (cm) 0.5 Depth: (cm) 2.4 Area: (cm) 0.079 Volume: (cm) 0.188 % Reduction in Area: 97.2% % Reduction in Volume: 96.7% Epithelialization: Small (1-33%) Tunneling: No Undermining: No Wound Description Classification: Category/Stage II Wound Margin: Well defined, not attached Exudate Amount: Small Exudate Type: Serosanguineous Exudate Color: red, brown Foul Odor After Cleansing: No Slough/Fibrino No Wound  Bed Granulation Amount: Large (67-100%) Exposed Structure Granulation Quality: Pink Fascia Exposed: No Necrotic Amount: None Present (0%) Fat Layer (Subcutaneous Tissue) Exposed: Yes Tendon Exposed: No Muscle Exposed: No Joint Exposed: No Bone Exposed: No Treatment Notes Wound #21 (Right, Medial Ischium) 1. Cleanse With Wound Cleanser 3. Primary Dressing Applied Calcium Alginate Ag 4. Secondary Dressing ABD Pad 5. Secured With [facility]) Signed: 01/14/2020 5:43:43 PM By: Levan Hurst RN, BSN Entered By: Levan Hurst on 01/14/2020 08:35:35 -------------------------------------------------------------------------------- Wound Assessment Details Patient Name: Date of Service: [name] RA [name], [name] 01/14/2020 9:00 A M Medical Record Number: [id number] Patient Account Number: [id number] Date of Birth/Sex: Treating RN: April 14, 1966 (54 y.o. [name] Primary Care Emine Lopata: [name] Other Clinician: Referring Perla Echavarria: Treating Meily Glowacki/Extender: Seymour Bars Weeks in Treatment: 128 Wound Status Wound Number: 22 Primary Etiology: Pressure Ulcer Wound Location: Left Ischium Wound Status: Open Wounding Event: Gradually Appeared Comorbid History: Anemia, Hypertension, Colitis, Osteomyelitis, Paraplegia Date Acquired: 10/02/2013 Weeks Of Treatment: 93 Clustered Wound: No Photos Photo Uploaded By: Mikeal Hawthorne on 01/15/2020 12:03:55 Wound Measurements Length: (cm) 2.4 % Redu Width: (cm) 0.5 % Redu Depth: (cm) 3.5 Epithe Area: (cm) 0.942 [place] Volume: (cm) 3.299 Under Sta End Max ction in Area: -122.2% ction in Volume: -177.7% lialization: Small (1-33%) ling: No mining: Yes rting Position (o'clock): 12 ing Position (o'clock): 12 imum Distance: (cm) 4.5 Wound Description Classification: Category/Stage II Foul Wound Margin: Epibole Sloug Exudate Amount: Medium Exudate Type: Serosanguineous Exudate  Color: red, brown Odor After Cleansing: No h/Fibrino No Wound Bed Granulation Amount: Large (67-100%) Exposed Structure Granulation Quality: Pink Fascia Exposed: No Necrotic Amount: None Present (0%) Fat Layer (Subcutaneous Tissue) Exposed: Yes Tendon Exposed: No Muscle Exposed: No Joint Exposed: No Bone Exposed:  No Treatment Notes Wound #22 (Left Ischium) 1. Cleanse With Wound Cleanser 3. Primary Dressing Applied Calcium Alginate Ag 4. Secondary Dressing ABD Pad 5. Secured With [facility]) Signed: 01/14/2020 5:43:43 PM By: Levan Hurst RN, BSN Entered By: Levan Hurst on 01/14/2020 08:32:26 -------------------------------------------------------------------------------- Wound Assessment Details Patient Name: Date of Service: [name] RA [name], [name] 01/14/2020 9:00 A M Medical Record Number: [id number] Patient Account Number: [id number] Date of Birth/Sex: Treating RN: 06/07/1966 (54 y.o. [name] Primary Care Addalyn Speedy: [name] Other Clinician: Referring Saylah Ketner: Treating Vivika Poythress/Extender: Seymour Bars Weeks in Treatment: 128 Wound Status Wound Number: 23 Primary Etiology: Pressure Ulcer Wound Location: Right Gluteus Wound Status: Open Wounding Event: Gradually Appeared Comorbid History: Anemia, Hypertension, Colitis, Osteomyelitis, Paraplegia Date Acquired: 09/18/2018 Weeks Of Treatment: 66 Clustered Wound: No Photos Photo Uploaded By: Mikeal Hawthorne on 01/15/2020 09:21:50 Wound Measurements Length: (cm) 1.3 Width: (cm) 0.7 Depth: (cm) 1 Area: (cm) 0.715 Volume: (cm) 0.715 % Reduction in Area: -106.6% % Reduction in Volume: 26.1% Epithelialization: Medium (34-66%) Tunneling: No Undermining: Yes Starting Position (o'clock): 12 Ending Position (o'clock): 4 Maximum Distance: (cm) 1.4 Wound Description Classification: Category/Stage II Wound Margin: Well defined, not attached Exudate  Amount: Medium Exudate Type: Serosanguineous Exudate Color: red, brown Foul Odor After Cleansing: No Slough/Fibrino No Wound Bed Granulation Amount: Large (67-100%) Exposed Structure Granulation Quality: Pink Fascia Exposed: No Necrotic Amount: None Present (0%) Fat Layer (Subcutaneous Tissue) Exposed: Yes Tendon Exposed: No Muscle Exposed: No Joint Exposed: No Bone Exposed: No Treatment Notes Wound #23 (Right Gluteus) 1. Cleanse With Wound Cleanser 3. Primary Dressing Applied Calcium Alginate Ag 4. Secondary Dressing ABD Pad 5. Secured With [facility]) Signed: 01/14/2020 5:43:43 PM By: Levan Hurst RN, BSN Entered By: Levan Hurst on 01/14/2020 08:36:10 -------------------------------------------------------------------------------- Wound Assessment Details Patient Name: Date of Service: [name] RA [place], [name] 01/14/2020 9:00 A M Medical Record Number: [id number] Patient Account Number: [id number] Date of Birth/Sex: Treating RN: January 24, 1966 (54 y.o. [name] Primary Care Asaad Gulley: [name] Other Clinician: Referring Clif Serio: Treating Casondra Gasca/Extender: Seymour Bars Weeks in Treatment: 128 Wound Status Wound Number: 24 Primary Etiology: Pressure Ulcer Wound Location: Right Scrotum Wound Status: Healed - Epithelialized Wounding Event: Gradually Appeared Comorbid History: Anemia, Hypertension, Colitis, Osteomyelitis, Paraplegia Date Acquired: 08/06/2019 Weeks Of Treatment: 23 Clustered Wound: No Photos Photo Uploaded By: Mikeal Hawthorne on 01/15/2020 12:03:34 Wound Measurements Length: (cm) Width: (cm) Depth: (cm) Area: (cm) Volume: (cm) 0 % Reduction in Area: 100% 0 % Reduction in Volume: 100% 0 Epithelialization: Large (67-100%) 0 Tunneling: No 0 Undermining: No Wound Description Classification: Category/Stage III Wound Margin: Flat and Intact Exudate Amount: None Present Wound  Bed Granulation Amount: None Present (0%) Necrotic Amount: None Present (0%) Foul Odor After Cleansing: No Slough/Fibrino No Exposed Structure Fascia Exposed: No Fat Layer (Subcutaneous Tissue) Exposed: No Tendon Exposed: No Muscle Exposed: No Joint Exposed: No Bone Exposed: No Electronic Signature(s) Signed: 01/14/2020 5:43:43 PM By: Levan Hurst RN, BSN Entered By: Levan Hurst on 01/14/2020 08:33:43 -------------------------------------------------------------------------------- Wound Assessment Details Patient Name: Date of Service: [name] RA [name], [name] 01/14/2020 9:00 A M Medical Record Number: [id number] Patient Account Number: [id number] Date of Birth/Sex: Treating RN: 1966-09-09 (54 y.o. [name] Primary Care Mikiyah Glasner: [name] Other Clinician: Referring Tanay Misuraca: Treating Amonda Brillhart/Extender: Seymour Bars Weeks in Treatment: 128 Wound Status Wound Number: 26 Primary Etiology: Pressure Ulcer Wound Location: Left, Dorsal Foot Wound Status: Healed - Epithelialized Wounding Event:  Shear/Friction Comorbid History: Anemia, Hypertension, Colitis, Osteomyelitis, Paraplegia Date Acquired: 09/24/2019 Weeks Of Treatment: 14 Clustered Wound: No Photos Photo Uploaded By: Mikeal Hawthorne on 01/15/2020 09:21:51 Wound Measurements Length: (cm) Width: (cm) Depth: (cm) Area: (cm) Volume: (cm) 0 % Reduction in Area: 100% 0 % Reduction in Volume: 100% 0 Epithelialization: Large (67-100%) 0 Tunneling: No 0 Undermining: No Wound Description Classification: Category/Stage III Wound Margin: Distinct, outline attached Exudate Amount: None Present Foul Odor After Cleansing: No Slough/Fibrino No Wound Bed Granulation Amount: None Present (0%) Exposed Structure Necrotic Amount: None Present (0%) Fascia Exposed: No Fat Layer (Subcutaneous Tissue) Exposed: No Tendon Exposed: No Muscle Exposed: No Joint Exposed: No Bone Exposed:  No Electronic Signature(s) Signed: 01/14/2020 5:43:43 PM By: Levan Hurst RN, BSN Entered By: Levan Hurst on 01/14/2020 08:36:47 -------------------------------------------------------------------------------- [place] Details Patient Name: Date of Service: [name] RA [name], [name] 01/14/2020 9:00 A M Medical Record Number: [id number] Patient Account Number: [id number] Date of Birth/Sex: Treating RN: 02/25/1966 (54 y.o. [name] Primary Care Joyann Spidle: [name] Other Clinician: Referring Denishia Citro: Treating Anastyn Ayars/Extender: Seymour Bars Weeks in Treatment: 128 Vital Signs Time Taken: 08:15 Temperature (F): 98.4 Height (in): 72 Pulse (bpm): 85 Weight (lbs): 215 Respiratory Rate (breaths/min): 18 Body Mass Index (BMI): 29.2 Blood Pressure (mmHg): 142/76 Reference Range: 80 - 120 mg / dl Electronic Signature(s) Signed: 01/14/2020 5:43:43 PM By: Levan Hurst RN, BSN Entered By: Levan Hurst on 01/14/2020 08:15:29

## 2020-02-25 ENCOUNTER — Encounter (HOSPITAL_BASED_OUTPATIENT_CLINIC_OR_DEPARTMENT_OTHER): Payer: Medicare Other | Attending: Physician Assistant | Admitting: Physician Assistant

## 2020-02-25 DIAGNOSIS — L89324 Pressure ulcer of left buttock, stage 4: Secondary | ICD-10-CM | POA: Insufficient documentation

## 2020-02-25 DIAGNOSIS — G8222 Paraplegia, incomplete: Secondary | ICD-10-CM | POA: Insufficient documentation

## 2020-02-25 DIAGNOSIS — Z87440 Personal history of urinary (tract) infections: Secondary | ICD-10-CM | POA: Insufficient documentation

## 2020-02-25 DIAGNOSIS — L89893 Pressure ulcer of other site, stage 3: Secondary | ICD-10-CM | POA: Diagnosis not present

## 2020-02-25 DIAGNOSIS — Z933 Colostomy status: Secondary | ICD-10-CM | POA: Insufficient documentation

## 2020-02-25 DIAGNOSIS — F17218 Nicotine dependence, cigarettes, with other nicotine-induced disorders: Secondary | ICD-10-CM | POA: Diagnosis not present

## 2020-02-25 DIAGNOSIS — L89892 Pressure ulcer of other site, stage 2: Secondary | ICD-10-CM | POA: Insufficient documentation

## 2020-02-25 DIAGNOSIS — G894 Chronic pain syndrome: Secondary | ICD-10-CM | POA: Diagnosis not present

## 2020-02-25 DIAGNOSIS — K219 Gastro-esophageal reflux disease without esophagitis: Secondary | ICD-10-CM | POA: Insufficient documentation

## 2020-02-25 DIAGNOSIS — Z6829 Body mass index (BMI) 29.0-29.9, adult: Secondary | ICD-10-CM | POA: Diagnosis not present

## 2020-02-25 DIAGNOSIS — I10 Essential (primary) hypertension: Secondary | ICD-10-CM | POA: Diagnosis not present

## 2020-02-25 DIAGNOSIS — L89154 Pressure ulcer of sacral region, stage 4: Secondary | ICD-10-CM | POA: Diagnosis not present

## 2020-02-25 DIAGNOSIS — L89314 Pressure ulcer of right buttock, stage 4: Secondary | ICD-10-CM | POA: Diagnosis not present

## 2020-02-25 DIAGNOSIS — E46 Unspecified protein-calorie malnutrition: Secondary | ICD-10-CM | POA: Insufficient documentation

## 2020-02-25 NOTE — Progress Notes (Addendum)
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 cigarettes, with other nicotine-induced disorders Facility Procedures Physician Procedures : CPT4 Code Description Modifier [id number] - WC PHYS LEVEL 3 - EST PT ICD-10 Diagnosis  Description L89.324 Pressure ulcer of left buttock, stage 4 L89.314 Pressure ulcer of right buttock, stage 4 L89.154 Pressure ulcer of sacral region, stage 4  L89.892 Pressure ulcer of other site, stage 2 Quantity: 1 Electronic Signature(s) Signed: 02/25/2020 10:00:22 AM By: [name] Keeler PA-C Entered By: [name] Keeler on 02/25/2020 10:00:21

## 2020-02-26 NOTE — Progress Notes (Signed)
[name], [name] ([id number]) Visit Report for 02/25/2020 Arrival Information Details Patient Name: Date of Service: CA RA [name] [name] 02/25/2020 9:00 A M Medical Record Number: [id number] Patient Account Number: [id number] Date of Birth/Sex: Treating RN: March 06, 1966 (54 y.o. Lorette Ang, Meta.Reding Primary Care Satoria Dunlop: [name] Other Clinician: Referring Myonna Chisom: Treating Ani Deoliveira/Extender: [name] in Treatment: 75 Visit Information History Since Last Visit Added or deleted any medications: No Patient Arrived: Wheel Chair Any new allergies or adverse reactions: No Arrival Time: 09:12 Had a fall or experienced change in No Accompanied By: self activities of daily living that may affect Transfer Assistance: None risk of falls: Patient Identification Verified: Yes Signs or symptoms of abuse/neglect since last visito No Secondary Verification Process Completed: Yes Hospitalized since last visit: No Patient Requires Transmission-Based Precautions: No Implantable device outside of the clinic excluding No Patient Has Alerts: No cellular tissue based products placed in the center since last visit: Has Dressing in Place as Prescribed: Yes Pain Present Now: No Electronic Signature(s) Signed: 02/25/2020 1:35:02 PM By: Deon Pilling Entered By: Deon Pilling on 02/25/2020 09:18:19 -------------------------------------------------------------------------------- Clinic Level of Care Assessment Details Patient Name: Date of Service: [name] 02/25/2020 9:00 [place] Record Number: [id number] Patient Account Number: [id number] Date of Birth/Sex: Treating RN: 1966/05/15 (54 y.o. [name] Primary Care Alfa Leibensperger: [name] Other Clinician: Referring Rozina Pointer: Treating Silena Wyss/Extender: [name] in Treatment: 134 Clinic Level of Care Assessment Items TOOL 4 Quantity Score []  - 0 Use when only an  EandM is performed on FOLLOW-UP visit ASSESSMENTS - Nursing Assessment / Reassessment X- 1 10 Reassessment of Co-morbidities (includes updates in patient status) X- 1 5 Reassessment of Adherence to Treatment Plan ASSESSMENTS - Wound and Skin A ssessment / Reassessment []  - 0 Simple Wound Assessment / Reassessment - one wound X- 5 5 Complex Wound Assessment / Reassessment - multiple wounds []  - 0 Dermatologic / Skin Assessment (not related to wound area) ASSESSMENTS - Focused Assessment []  - 0 Circumferential Edema Measurements - multi extremities []  - 0 Nutritional Assessment / Counseling / Intervention []  - 0 Lower Extremity Assessment (monofilament, tuning fork, pulses) []  - 0 Peripheral Arterial Disease Assessment (using hand held doppler) ASSESSMENTS - Ostomy and/or Continence Assessment and Care []  - 0 Incontinence Assessment and Management []  - 0 Ostomy Care Assessment and Management (repouching, etc.) PROCESS - Coordination of Care X - Simple Patient / Family Education for ongoing care 1 15 []  - 0 Complex (extensive) Patient / Family Education for ongoing care X- 1 10 Staff obtains Consents, Records, T Results / Process Orders est []  - 0 Staff telephones HHA, Nursing Homes / Clarify orders / etc []  - 0 Routine Transfer to another Facility (non-emergent condition) []  - 0 Routine Hospital Admission (non-emergent condition) []  - 0 New Admissions / Biomedical engineer / Ordering NPWT Apligraf, etc. , []  - 0 Emergency Hospital Admission (emergent condition) X- 1 10 Simple Discharge Coordination []  - 0 Complex (extensive) Discharge Coordination PROCESS - Special Needs []  - 0 Pediatric / Minor Patient Management []  - 0 Isolation Patient Management []  - 0 Hearing / Language / Visual special needs []  - 0 Assessment of Community assistance (transportation, D/C planning, etc.) []  - 0 Additional assistance / Altered mentation []  - 0 Support Surface(s)  Assessment (bed, cushion, seat, etc.) INTERVENTIONS - Wound Cleansing / Measurement []  - 0 Simple Wound Cleansing - one wound X- 5 5 Complex Wound Cleansing -  multiple wounds X- 1 5 Wound Imaging (photographs - any number of wounds) []  - 0 Wound Tracing (instead of photographs) []  - 0 Simple Wound Measurement - one wound X- 5 5 Complex Wound Measurement - multiple wounds INTERVENTIONS - Wound Dressings X - Small Wound Dressing one or multiple wounds 5 10 []  - 0 Medium Wound Dressing one or multiple wounds []  - 0 Large Wound Dressing one or multiple wounds X- 1 5 Application of Medications - topical []  - 0 Application of Medications - injection INTERVENTIONS - Miscellaneous []  - 0 External ear exam []  - 0 Specimen Collection (cultures, biopsies, blood, body fluids, etc.) []  - 0 Specimen(s) / Culture(s) sent or taken to Lab for analysis []  - 0 Patient Transfer (multiple staff / Civil Service fast streamer / Similar devices) []  - 0 Simple Staple / Suture removal (25 or less) []  - 0 Complex Staple / Suture removal (26 or more) []  - 0 Hypo / Hyperglycemic Management (close monitor of Blood Glucose) []  - 0 Ankle / Brachial Index (ABI) - do not check if billed separately X- 1 5 Vital Signs Has the patient been seen at the hospital within the last three years: Yes Total Score: 190 Level Of Care: New/Established - Level 5 Electronic Signature(s) Signed: 02/25/2020 2:12:16 PM By: Baruch Gouty RN, BSN Entered By: Baruch Gouty on 02/25/2020 09:53:55 -------------------------------------------------------------------------------- Encounter Discharge Information Details Patient Name: Date of Service: CA RA [name], [name] RRIN 02/25/2020 9:00 A M Medical Record Number: [id number] Patient Account Number: [id number] Date of Birth/Sex: Treating RN: [birth year] (54 y.o. Janyth Contes Primary Care Katianna Mcclenney: [name] Other Clinician: Referring Quanda Pavlicek: Treating Keiran Sias/Extender: [name] in Treatment: [phone] Encounter Discharge Information Items Discharge Condition: Stable Ambulatory Status: Wheelchair Discharge Destination: Home Transportation: Private Auto Accompanied By: alone Schedule Follow-up Appointment: Yes Clinical Summary of Care: Patient Declined Electronic Signature(s) Signed: 02/26/2020 5:36:53 PM By: Levan Hurst RN, BSN Entered By: Levan Hurst on 02/25/2020 10:43:39 -------------------------------------------------------------------------------- Lower Extremity Assessment Details Patient Name: Date of Service: CA RA [name], [name] 02/25/2020 9:00 A M Medical Record Number: [id number] Patient Account Number: [id number] Date of Birth/Sex: Treating RN: 06-17-1966 (54 y.o. [name] Primary Care Hajime Asfaw: [name] Other Clinician: Referring Tylie Golonka: Treating Izear Pine/Extender: Seymour Bars Weeks in Treatment: 134 Electronic Signature(s) Signed: 02/25/2020 1:35:02 PM By: Deon Pilling Entered By: Deon Pilling on 02/25/2020 09:18:54 -------------------------------------------------------------------------------- [place] Details Patient Name: Date of Service: CA RA [place], [place] 02/25/2020 9:00 A M Medical Record Number: [id number] Patient Account Number: [id number] Date of Birth/Sex: Treating RN: September 02, 1966 (54 y.o. Ulyses Amor, Vaughan Basta Primary Care Chelesa Weingartner: [name] Other Clinician: Referring Porschia Willbanks: Treating Jessyka Austria/Extender: [name] in Treatment: 134 Active Inactive Pressure Nursing Diagnoses: Knowledge deficit related to causes and risk factors for pressure ulcer development Knowledge deficit related to management of pressures ulcers Potential for impaired tissue integrity related to pressure, friction, moisture, and shear Goals: Patient will remain free from development of additional pressure ulcers Date  Initiated: 12/12/2017 Date Inactivated: 08/14/2018 Target Resolution Date: 08/21/2018 Goal Status: Unmet Unmet Reason: hospitalized Patient will remain free of pressure ulcers Date Initiated: 12/12/2017 Date Inactivated: 02/06/2018 Target Resolution Date: 02/06/2018 Unmet Reason: pt noncompliant wth Goal Status: Unmet offloading Patient/caregiver will verbalize understanding of pressure ulcer management Date Initiated: 12/12/2017 Target Resolution Date: 03/26/2020 Goal Status: Active Interventions: Assess: immobility, friction, shearing, incontinence upon admission and as needed Assess offloading mechanisms upon admission and as needed Assess potential  for pressure ulcer upon admission and as needed Notes: Wound/Skin Impairment Nursing Diagnoses: Impaired tissue integrity Goals: Patient/caregiver will verbalize understanding of skin care regimen Date Initiated: 03/06/2018 Target Resolution Date: 03/26/2020 Goal Status: Active Ulcer/skin breakdown will heal within 14 weeks Date Initiated: 07/31/2017 Date Inactivated: 01/09/2018 Target Resolution Date: 11/30/2017 Unmet Reason: noncompliant with Goal Status: Unmet offloading Interventions: Assess patient/caregiver ability to perform ulcer/skin care regimen upon admission and as needed Notes: Electronic Signature(s) Signed: 02/25/2020 2:12:16 PM By: Baruch Gouty RN, BSN Entered By: Baruch Gouty on 02/25/2020 09:13:16 -------------------------------------------------------------------------------- Pain Assessment Details Patient Name: Date of Service: CA RA [name], [name] RRIN 02/25/2020 9:00 [name] Record Number: [id number] Patient Account Number: [id number] Date of Birth/Sex: Treating RN: October 02, 1966 (54 y.o. [name] Primary Care Lakshya Mcgillicuddy: [name] Other Clinician: Referring Tiara Maultsby: Treating Leeum Sankey/Extender: [name] in Treatment: 134 Active Problems Location of Pain Severity  and Description of Pain Patient Has Paino Yes Site Locations Pain Location: Pain in Ulcers Rate the pain. Current Pain Level: 7 Worst Pain Level: 10 Least Pain Level: 0 Tolerable Pain Level: 8 Pain Management and Medication Current Pain Management: Medication: No Cold Application: No Rest: No Massage: No Activity: No T.E.N.S.: No Heat Application: No Leg drop or elevation: No Is the Current Pain Management Adequate: Adequate How does your wound impact your activities of daily livingo Sleep: No Bathing: No Appetite: No Relationship With Others: No Bladder Continence: No Emotions: No Bowel Continence: No Work: No Toileting: No Drive: No Dressing: No Hobbies: No Electronic Signature(s) Signed: 02/25/2020 1:35:02 PM By: Deon Pilling Entered By: Deon Pilling on 02/25/2020 09:37:30 -------------------------------------------------------------------------------- Patient/Caregiver Education Details Patient Name: Date of Service: CA RA [name], [name] RRIN 5/26/2021andnbsp9:00 A M Medical Record Number: [id number] Patient Account Number: [id number] Date of Birth/Gender: Treating RN: [birth year] (54 y.o. [name] Primary Care Physician: [name] Other Clinician: Referring Physician: Treating Physician/Extender: [name] in Treatment: 134 Education Assessment Education Provided To: Patient Education Topics Provided Pressure: Methods: Explain/Verbal Responses: Reinforcements needed, State content correctly Wound/Skin Impairment: Methods: Explain/Verbal Responses: Reinforcements needed, State content correctly Electronic Signature(s) Signed: 02/25/2020 2:12:16 PM By: Baruch Gouty RN, BSN Entered By: Baruch Gouty on 02/25/2020 09:15:13 -------------------------------------------------------------------------------- Wound Assessment Details Patient Name: Date of Service: CA RA [place], [name] RRIN 02/25/2020 9:00 [place]  Record Number: [id number] Patient Account Number: [id number] Date of Birth/Sex: Treating RN: 1966-07-15 (54 y.o. [name] Primary Care Patric Vanpelt: [name] Other Clinician: Referring Shara Hartis: Treating Sanayah Munro/Extender: Seymour Bars Weeks in Treatment: 134 Wound Status Wound Number: 17 Primary Etiology: Pressure Ulcer Wound Location: Right Ischial Tuberosity Wound Status: Open Wounding Event: Pressure Injury Comorbid History: Anemia, Hypertension, Colitis, Osteomyelitis, Paraplegia Date Acquired: 10/02/2013 Weeks Of Treatment: 134 Clustered Wound: No Photos Photo Uploaded By: Mikeal Hawthorne on 02/26/2020 15:11:37 Wound Measurements Length: (cm) 0.3 Width: (cm) 0.8 Depth: (cm) 3 Area: (cm) 0.188 Volume: (cm) 0.565 % Reduction in Area: 99.2% % Reduction in Volume: 98.4% Epithelialization: Small (1-33%) Tunneling: No Undermining: No Wound Description Classification: Category/Stage IV Wound Margin: Fibrotic scar, thickened scar Exudate Amount: Medium Exudate Type: Serosanguineous Exudate Color: red, brown Foul Odor After Cleansing: No Slough/Fibrino No Wound Bed Granulation Amount: Large (67-100%) Exposed Structure Granulation Quality: Pink Fascia Exposed: No Necrotic Amount: None Present (0%) Fat Layer (Subcutaneous Tissue) Exposed: Yes Tendon Exposed: No Muscle Exposed: No Joint Exposed: No Bone Exposed: Yes Treatment Notes Wound #17 (Right Ischial Tuberosity) 1. Cleanse With Wound Cleanser 3. Primary  Dressing Applied Calcium Alginate Ag 4. Secondary Dressing ABD Pad Electronic Signature(s) Signed: 02/25/2020 1:35:02 PM By: Deon Pilling Entered By: Deon Pilling on 02/25/2020 09:35:19 -------------------------------------------------------------------------------- Wound Assessment Details Patient Name: Date of Service: [name] RRIN 02/25/2020 9:00 A M Medical Record Number: [id number] Patient Account Number:  [id number] Date of Birth/Sex: Treating RN: 1966/08/23 (54 y.o. [name] Primary Care Eveline Sauve: [name] Other Clinician: Referring Omar Orrego: Treating Riot Waterworth/Extender: Seymour Bars Weeks in Treatment: 134 Wound Status Wound Number: 19 Primary Etiology: Pressure Ulcer Wound Location: Sacrum Wound Status: Open Wounding Event: Pressure Injury Comorbid History: Anemia, Hypertension, Colitis, Osteomyelitis, Paraplegia Date Acquired: 10/02/2013 Weeks Of Treatment: 134 Clustered Wound: No Photos Photo Uploaded By: Mikeal Hawthorne on 02/26/2020 15:11:11 Wound Measurements Length: (cm) 1 Width: (cm) 0.9 Depth: (cm) 1.1 Area: (cm) 0.707 Volume: (cm) 0.778 % Reduction in Area: 66.9% % Reduction in Volume: 72% Epithelialization: Medium (34-66%) Tunneling: No Undermining: No Wound Description Classification: Category/Stage IV Wound Margin: Fibrotic scar, thickened scar Exudate Amount: Medium Exudate Type: Serosanguineous Exudate Color: red, brown Foul Odor After Cleansing: No Slough/Fibrino No Wound Bed Granulation Amount: Large (67-100%) Exposed Structure Granulation Quality: Red, Pink Fascia Exposed: No Necrotic Amount: None Present (0%) Fat Layer (Subcutaneous Tissue) Exposed: Yes Tendon Exposed: No Muscle Exposed: No Joint Exposed: No Bone Exposed: No Treatment Notes Wound #19 (Sacrum) 1. Cleanse With Wound Cleanser 3. Primary Dressing Applied Calcium Alginate Ag 4. Secondary Dressing ABD Pad Electronic Signature(s) Signed: 02/25/2020 1:35:02 PM By: Deon Pilling Entered By: Deon Pilling on 02/25/2020 09:35:56 -------------------------------------------------------------------------------- Wound Assessment Details Patient Name: Date of Service: [name] RRIN 02/25/2020 9:00 A M Medical Record Number: [id number] Patient Account Number: [id number] Date of Birth/Sex: Treating RN: 02-Dec-1965 (54 y.o. [name] Primary Care Eldean Nanna: [name] Other Clinician: Referring Kylieann Eagles: Treating Murtaza Shell/Extender: Seymour Bars Weeks in Treatment: 134 Wound Status Wound Number: 21 Primary Etiology: Pressure Ulcer Wound Location: Right, Medial Ischium Wound Status: Open Wounding Event: Gradually Appeared Comorbid History: Anemia, Hypertension, Colitis, Osteomyelitis, Paraplegia Date Acquired: 10/02/2012 Weeks Of Treatment: 99 Clustered Wound: No Photos Photo Uploaded By: Mikeal Hawthorne on 02/26/2020 15:11:52 Wound Measurements Length: (cm) 0.4 Width: (cm) 0.4 Depth: (cm) 2.9 Area: (cm) 0.126 Volume: (cm) 0.364 % Reduction in Area: 95.5% % Reduction in Volume: 93.6% Epithelialization: Small (1-33%) Tunneling: No Undermining: No Wound Description Classification: Category/Stage II Wound Margin: Well defined, not attached Exudate Amount: Small Exudate Type: Serosanguineous Exudate Color: red, brown Foul Odor After Cleansing: No Slough/Fibrino No Wound Bed Granulation Amount: Large (67-100%) Exposed Structure Granulation Quality: Pink Fascia Exposed: No Necrotic Amount: None Present (0%) Fat Layer (Subcutaneous Tissue) Exposed: Yes Tendon Exposed: No Muscle Exposed: No Joint Exposed: No Bone Exposed: No Treatment Notes Wound #21 (Right, Medial Ischium) 1. Cleanse With Wound Cleanser 3. Primary Dressing Applied Calcium Alginate Ag 4. Secondary Dressing ABD Pad Electronic Signature(s) Signed: 02/25/2020 1:35:02 PM By: Deon Pilling Entered By: Deon Pilling on 02/25/2020 09:36:35 -------------------------------------------------------------------------------- Wound Assessment Details Patient Name: Date of Service: CA [name] RRIN 02/25/2020 9:00 A M Medical Record Number: [id number] Patient Account Number: [id number] Date of Birth/Sex: Treating RN: 08-19-1966 (54 y.o. [name] Primary Care Allesha Aronoff: [name] Other  Clinician: Referring Pennie Vanblarcom: Treating Calyse Murcia/Extender: Seymour Bars Weeks in Treatment: 134 Wound Status Wound Number: 22 Primary Etiology: Pressure Ulcer Wound Location: Left Ischium Wound Status: Open Wounding Event: Gradually Appeared Comorbid History: Anemia, Hypertension, Colitis, Osteomyelitis, Paraplegia Date Acquired: 10/02/2013 [name]  Of Treatment: 99 Clustered Wound: No Photos Photo Uploaded By: Mikeal Hawthorne on 02/26/2020 15:12:06 Wound Measurements Length: (cm) 2.3 Width: (cm) 0.7 Depth: (cm) 3 Area: (cm) 1.264 Volume: (cm) 3.793 % Reduction in Area: -198.1% % Reduction in Volume: -219.3% Epithelialization: Small (1-33%) Tunneling: No Undermining: Yes Starting Position (o'clock): 6 Ending Position (o'clock): 12 Maximum Distance: (cm) 4.7 Wound Description Classification: Category/Stage II Wound Margin: Epibole Exudate Amount: Medium Exudate Type: Serosanguineous Exudate Color: red, brown Foul Odor After Cleansing: Yes Due to Product Use: No Slough/Fibrino No Wound Bed Granulation Amount: Large (67-100%) Exposed Structure Granulation Quality: Pink Fascia Exposed: No Necrotic Amount: None Present (0%) Fat Layer (Subcutaneous Tissue) Exposed: Yes Tendon Exposed: No Muscle Exposed: No Joint Exposed: No Bone Exposed: No Treatment Notes Wound #22 (Left Ischium) 1. Cleanse With Wound Cleanser 3. Primary Dressing Applied Calcium Alginate Ag 4. Secondary Dressing ABD Pad Electronic Signature(s) Signed: 02/25/2020 1:35:02 PM By: Deon Pilling Entered By: Deon Pilling on 02/25/2020 09:37:08 -------------------------------------------------------------------------------- Wound Assessment Details Patient Name: Date of Service: CA [name] RRIN 02/25/2020 9:00 A M Medical Record Number: [id number] Patient Account Number: [id number] Date of Birth/Sex: Treating RN: [birth year] (54 y.o. [name] Primary Care Mikaya Bunner:  [name] Other Clinician: Referring Tyrez Berrios: Treating Dory Verdun/Extender: Seymour Bars Weeks in Treatment: 134 Wound Status Wound Number: 23 Primary Etiology: Pressure Ulcer Wound Location: Right Gluteus Wound Status: Open Wounding Event: Gradually Appeared Comorbid History: Anemia, Hypertension, Colitis, Osteomyelitis, Paraplegia Date Acquired: 09/18/2018 Weeks Of Treatment: 72 Clustered Wound: No Photos Photo Uploaded By: Mikeal Hawthorne on 02/26/2020 15:11:12 Wound Measurements Length: (cm) 0.1 Width: (cm) 0.1 Depth: (cm) 0.1 Area: (cm) 0.008 Volume: (cm) 0.001 % Reduction in Area: 97.7% % Reduction in Volume: 99.9% Epithelialization: Large (67-100%) Tunneling: No Undermining: No Wound Description Classification: Category/Stage II Wound Margin: Well defined, not attached Exudate Amount: None Present Foul Odor After Cleansing: No Slough/Fibrino No Wound Bed Granulation Amount: None Present (0%) Exposed Structure Necrotic Amount: None Present (0%) Fascia Exposed: No Fat Layer (Subcutaneous Tissue) Exposed: No Tendon Exposed: No Muscle Exposed: No Joint Exposed: No Bone Exposed: No Treatment Notes Wound #23 (Right Gluteus) 1. Cleanse With Wound Cleanser 3. Primary Dressing Applied Calcium Alginate Ag 4. Secondary Dressing ABD Pad Electronic Signature(s) Signed: 02/25/2020 1:35:02 PM By: Deon Pilling Entered By: Deon Pilling on 02/25/2020 09:34:31 -------------------------------------------------------------------------------- Vitals Details Patient Name: Date of Service: CA RA [name], [name] RRIN 02/25/2020 9:00 A M Medical Record Number: [id number] Patient Account Number: [id number] Date of Birth/Sex: Treating RN: 07/30/1966 (54 y.o. [name] Primary Care Deane Melick: [name] Other Clinician: Referring Latorie Montesano: Treating Omir Cooprider/Extender: Seymour Bars Weeks in Treatment: 134 Vital  Signs Time Taken: 09:15 Temperature (F): 98.4 Height (in): 72 Pulse (bpm): 81 Weight (lbs): 215 Respiratory Rate (breaths/min): 20 Body Mass Index (BMI): 29.2 Blood Pressure (mmHg): 125/68 Reference Range: 80 - 120 mg / dl Electronic Signature(s) Signed: 02/25/2020 1:35:02 PM By: Deon Pilling Entered By: Deon Pilling on 02/25/2020 09:18:37

## 2020-03-24 ENCOUNTER — Encounter (HOSPITAL_BASED_OUTPATIENT_CLINIC_OR_DEPARTMENT_OTHER): Payer: Medicare Other | Attending: Physician Assistant | Admitting: Physician Assistant

## 2020-03-24 DIAGNOSIS — Z6829 Body mass index (BMI) 29.0-29.9, adult: Secondary | ICD-10-CM | POA: Diagnosis not present

## 2020-03-24 DIAGNOSIS — G8222 Paraplegia, incomplete: Secondary | ICD-10-CM | POA: Diagnosis not present

## 2020-03-24 DIAGNOSIS — L89314 Pressure ulcer of right buttock, stage 4: Secondary | ICD-10-CM | POA: Diagnosis not present

## 2020-03-24 DIAGNOSIS — L89154 Pressure ulcer of sacral region, stage 4: Secondary | ICD-10-CM | POA: Insufficient documentation

## 2020-03-24 DIAGNOSIS — L89324 Pressure ulcer of left buttock, stage 4: Secondary | ICD-10-CM | POA: Diagnosis not present

## 2020-03-24 DIAGNOSIS — F17218 Nicotine dependence, cigarettes, with other nicotine-induced disorders: Secondary | ICD-10-CM | POA: Insufficient documentation

## 2020-03-24 DIAGNOSIS — L89892 Pressure ulcer of other site, stage 2: Secondary | ICD-10-CM | POA: Insufficient documentation

## 2020-03-24 DIAGNOSIS — L89893 Pressure ulcer of other site, stage 3: Secondary | ICD-10-CM | POA: Diagnosis not present

## 2020-03-24 DIAGNOSIS — E669 Obesity, unspecified: Secondary | ICD-10-CM | POA: Diagnosis not present

## 2020-03-24 NOTE — Progress Notes (Addendum)
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 Patient Name: Date of Service: CA RA [place], [name] 03/24/2020 Medical Record Number: [id number] Patient Account Number: [id number] Date of Birth/Sex: Treating RN: 08-04-1966 (54 y.o. 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 Nicotine dependence, cigarettes, with other nicotine-induced disorders Facility Procedures CPT4 Code: [id number] Description: [id number] - WOUND CARE VISIT-LEV 5 EST PT Modifier: Quantity: 1 Physician Procedures : CPT4 Code Description Modifier [id number] - WC PHYS LEVEL 3 - EST PT ICD-10 Diagnosis Description L89.324 Pressure ulcer of left buttock, stage 4 L89.314 Pressure ulcer of right buttock, stage 4 L89.154 Pressure ulcer of sacral region, stage 4  L89.892 Pressure ulcer of other site, stage 2 Quantity: 1 Electronic Signature(s) Signed: 03/24/2020 4:25:30 PM By: Baruch Gouty RN, BSN Signed: 03/26/2020  3:47:20 PM By: [name] Keeler PA-C Previous Signature: 03/24/2020 10:16:42 AM Version By: [name] Keeler PA-C Entered By: Baruch Gouty on 03/24/2020 10:19:39

## 2020-03-26 NOTE — Progress Notes (Signed)
[name] ([id number]) Visit Report for 03/24/2020 Arrival Information Details Patient Name: Date of Service: CA RA [name] [name] [name] 03/24/2020 9:00 A M Medical Record Number: [id number] Patient Account Number: [id number] Date of Birth/Sex: Treating RN: August 10, 1966 (54 y.o. [name] Primary Care [name]: [name] Other Clinician: Referring [name] : [name] Visit Information History Since Last Visit Added or deleted any medications: No Patient Arrived: Wheel Chair Any new allergies or adverse reactions: No Arrival Time: 09:28 Had a fall or experienced change in No Accompanied By: alone activities of daily living that may affect Transfer Assistance: None risk of falls: Patient Identification Verified: Yes Signs or symptoms of abuse/neglect since last visito No Secondary Verification Process Completed: Yes Hospitalized since last visit: No Patient Requires Transmission-Based Precautions: No Implantable device outside of the clinic excluding No Patient Has Alerts: No cellular tissue based products placed in the center since last visit: Has Dressing in Place as Prescribed: Yes Pain Present Now: Yes Electronic Signature(s) Signed: 03/26/2020 5:45:26 PM By: [name] Hurst RN, BSN Entered By: [name] on 03/24/2020 09:28:52 -------------------------------------------------------------------------------- Clinic Level of Care Assessment Details Patient Name: Date of Service: CA RA [name], [place] 03/24/2020 9:00 [place] Record Number: [id number] Patient Account Number: [id number] Date of Birth/Sex: Treating RN: 1966-02-22 (54 y.o. [name] Primary Care Lula Kolton: [name] Other Clinician: Referring [name]: [name] in Treatment: [name] Level of Care Assessment [name] TOOL 4 Quantity Score []  - 0 Use  when only an EandM is performed on FOLLOW-UP visit ASSESSMENTS - Nursing Assessment / Reassessment X- 1 10 Reassessment of Co-morbidities (includes updates in patient status) X- 1 5 Reassessment of Adherence to Treatment Plan ASSESSMENTS - Wound and Skin A ssessment / Reassessment []  - 0 Simple Wound Assessment / Reassessment - one wound X- 5 5 Complex Wound Assessment / Reassessment - multiple wounds []  - 0 Dermatologic / Skin Assessment (not related to wound area) ASSESSMENTS - Focused Assessment []  - 0 Circumferential Edema Measurements - multi extremities []  - 0 Nutritional Assessment / Counseling / Intervention []  - 0 Lower Extremity Assessment (monofilament, tuning fork, pulses) []  - 0 Peripheral Arterial Disease Assessment (using hand held doppler) ASSESSMENTS - Ostomy and/or Continence Assessment and Care []  - 0 Incontinence Assessment and Management []  - 0 Ostomy Care Assessment and Management (repouching, etc.) PROCESS - Coordination of Care X - Simple Patient / Family Education for ongoing care 1 15 []  - 0 Complex (extensive) Patient / Family Education for ongoing care X- 1 10 Staff obtains Consents, Records, T Results / Process Orders est []  - 0 Staff telephones HHA, Nursing Homes / Clarify orders / etc []  - 0 Routine Transfer to another Facility (non-emergent condition) []  - 0 Routine Hospital Admission (non-emergent condition) []  - 0 New Admissions / Biomedical engineer / Ordering NPWT Apligraf, etc. , []  - 0 Emergency Hospital Admission (emergent condition) X- 1 10 Simple Discharge Coordination []  - 0 Complex (extensive) Discharge Coordination PROCESS - Special Needs []  - 0 Pediatric / Minor Patient Management []  - 0 Isolation Patient Management []  - 0 Hearing / Language / Visual special needs []  - 0 Assessment of Community assistance (transportation, D/C planning, etc.) []  - 0 Additional assistance / Altered mentation []  - 0 Support  Surface(s) Assessment (bed, cushion, seat, etc.) INTERVENTIONS - Wound Cleansing / Measurement []  - 0 Simple Wound Cleansing - one wound X- 5 5 Complex Wound  Cleansing - multiple wounds X- 1 5 Wound Imaging (photographs - any number of wounds) []  - 0 Wound Tracing (instead of photographs) []  - 0 Simple Wound Measurement - one wound X- 5 5 Complex Wound Measurement - multiple wounds INTERVENTIONS - Wound Dressings X - Small Wound Dressing one or multiple wounds 4 10 []  - 0 Medium Wound Dressing one or multiple wounds []  - 0 Large Wound Dressing one or multiple wounds X- 1 5 Application of Medications - topical []  - 0 Application of Medications - injection INTERVENTIONS - Miscellaneous []  - 0 External ear exam []  - 0 Specimen Collection (cultures, biopsies, blood, body fluids, etc.) []  - 0 Specimen(s) / Culture(s) sent or taken to Lab for analysis []  - 0 Patient Transfer (multiple staff / Civil Service fast streamer / Similar devices) []  - 0 Simple Staple / Suture removal (25 or less) []  - 0 Complex Staple / Suture removal (26 or more) []  - 0 Hypo / Hyperglycemic Management (close monitor of Blood Glucose) []  - 0 Ankle / Brachial Index (ABI) - do not check if billed separately X- 1 5 Vital Signs Has the patient been seen at the hospital within the last three years: Yes Total Score: 180 Level Of Care: New/Established - Level 5 Electronic Signature(s) Signed: 03/24/2020 4:25:30 PM By: [name] Gouty RN, BSN Entered By: [name] on 03/24/2020 10:19:20 -------------------------------------------------------------------------------- Encounter Discharge Information Details Patient Name: Date of Service: CA RA [name] 03/24/2020 9:00 A M Medical Record Number: [id number] Patient Account Number: [id number] Date of Birth/Sex: Treating RN: 11/28/1965 (54 y.o. [name] Primary Care [name] Other Clinician: Referring [name]: [name]/Extender:  [name] Discharge Condition: Stable Ambulatory Status: Wheelchair Discharge Destination: Home Transportation: Private Auto Accompanied By: self Schedule Follow-up Appointment: Yes Clinical Summary of Care: Patient Declined Electronic Signature(s) Signed: 03/24/2020 4:51:47 PM By: Carlene Coria RN Entered By: Carlene Coria on 03/24/2020 10:43:21 -------------------------------------------------------------------------------- [place] Details Patient Name: Date of Service: CA RA [place], [name] 03/24/2020 9:00 A M Medical Record Number: [id number] Patient Account Number: [id number] Date of Birth/Sex: Treating RN: [birth year] (54 y.o. Ulyses Amor, Vaughan Basta Primary Care Quaneisha Hanisch: [name] Other Clinician: Referring Jceon Alverio: [name] Raekwan Spelman/Extender: [name] Weeks in Treatment: [phone] Active Inactive Pressure Nursing Diagnoses: Knowledge deficit related to causes and risk factors for pressure ulcer development Knowledge deficit related to management of pressures ulcers Potential for impaired tissue integrity related to pressure, friction, moisture, and shear Goals: Patient will remain free from development of additional pressure ulcers Date Initiated: 12/12/2017 Date Inactivated: 08/14/2018 Target Resolution Date: 08/21/2018 Goal Status: Unmet Unmet Reason: hospitalized Patient will remain free of pressure ulcers Date Initiated: 12/12/2017 Date Inactivated: 02/06/2018 Target Resolution Date: 02/06/2018 Unmet Reason: pt noncompliant wth Goal Status: Unmet offloading Patient/caregiver will verbalize understanding of pressure ulcer management Date Initiated: 12/12/2017 Target Resolution Date: 04/21/2020 Goal Status: Active Interventions: Assess: immobility, friction, shearing, incontinence upon admission and as needed Assess offloading mechanisms upon  admission and as needed Assess potential for pressure ulcer upon admission and as needed Notes: Wound/Skin Impairment Nursing Diagnoses: Impaired tissue integrity Goals: Patient/caregiver will verbalize understanding of skin care regimen Date Initiated: 03/06/2018 Target Resolution Date: 04/21/2020 Goal Status: Active Ulcer/skin breakdown will heal within 14 weeks Date Initiated: 07/31/2017 Date Inactivated: 01/09/2018 Target Resolution Date: 11/30/2017 Unmet Reason: noncompliant with Goal Status: Unmet offloading Interventions: Assess patient/caregiver ability to perform ulcer/skin care regimen upon admission and as  needed Notes: Electronic Signature(s) Signed: 03/24/2020 4:25:30 PM By: [name] Gouty RN, BSN Entered By: [name] on 03/24/2020 10:13:48 -------------------------------------------------------------------------------- Pain Assessment Details Patient Name: Date of Service: CA RA [place], [name] 03/24/2020 9:00 A M Medical Record Number: [id number] Patient Account Number: [id number] Date of Birth/Sex: Treating RN: 1966-07-24 (55 y.o. [name] Primary Care Arrie Borrelli: [name] Other Clinician: Referring Roman Sandall: [name] Kinsey Karch/Extender: [name] Weeks in Treatment: [phone] Active Problems Location of Pain Severity and Description of Pain Patient Has Paino Yes Site Locations Pain Location: Pain in Ulcers With Dressing Change: Yes Duration of the Pain. Constant / Intermittento Intermittent Rate the pain. Current Pain Level: 5 Character of Pain Describe the Pain: Tender, Throbbing Pain Management and Medication Current Pain Management: Medication: Yes Cold Application: No Rest: No Massage: No Activity: No T.E.N.S.: No Heat Application: No Leg drop or elevation: No Is the Current Pain Management Adequate: Adequate How does your wound impact your activities of daily livingo Sleep: No Bathing: No Appetite:  No Relationship With Others: No Bladder Continence: No Emotions: No Bowel Continence: No Work: No Toileting: No Drive: No Dressing: No Hobbies: No Engineer, maintenance) Signed: 03/26/2020 5:45:26 PM By: [name] Hurst RN, BSN Entered By: [name] on 03/24/2020 09:29:29 -------------------------------------------------------------------------------- Patient/Caregiver Education Details Patient Name: Date of Service: CA RA [name] [name] 6/23/2021andnbsp9:00 A M Medical Record Number: [id number] Patient Account Number: [id number] Date of Birth/Gender: Treating RN: 03-28-1966 (54 y.o. [name] Primary Care Physician: [name] Other Clinician: Referring Physician: Treating Physician/Extender: [name] in Treatment: [phone] Education Assessment Education Provided To: Patient Education Topics Provided Pressure: Methods: Explain/Verbal Responses: Reinforcements needed, State content correctly Wound/Skin Impairment: Methods: Explain/Verbal Responses: Reinforcements needed, State content correctly Electronic Signature(s) Signed: 03/24/2020 4:25:30 PM By: [name] Gouty RN, BSN Entered By: [name] on 03/24/2020 10:14:12 -------------------------------------------------------------------------------- Wound Assessment Details Patient Name: Date of Service: CA RA [place], [name] 03/24/2020 9:00 A M Medical Record Number: [id number] Patient Account Number: [id number] Date of Birth/Sex: Treating RN: 17-Mar-1966 (54 y.o. [name] Primary Care Cheyenne Schumm: Other Clinician: York [name] [name] Denyla Cortese: [name] Dayra Rapley/Extender: [name] Wound Status Wound Number: 17 Primary Etiology: Pressure Ulcer Wound Location: Right Ischial Tuberosity Wound Status: Healed - Epithelialized Wounding Event: Pressure Injury Comorbid History: Anemia, Hypertension, Colitis,  Osteomyelitis, Paraplegia Date Acquired: 10/02/2013 Weeks Of Treatment: [name] Clustered Wound: No Photos Wound Measurements Length: (cm) Width: (cm) Depth: (cm) Area: (cm) Volume: (cm) 0 % Reduction in Area: 100% 0 % Reduction in Volume: 100% 0 Epithelialization: Large (67-100%) 0 Tunneling: No 0 Undermining: No Wound Description Classification: Category/Stage IV Wound Margin: Fibrotic scar, thickened scar Exudate Amount: None Present Foul Odor After Cleansing: No Slough/Fibrino No Wound Bed Granulation Amount: None Present (0%) Exposed Structure Necrotic Amount: None Present (0%) Fascia Exposed: No Fat Layer (Subcutaneous Tissue) Exposed: No Tendon Exposed: No Muscle Exposed: No Joint Exposed: No Bone Exposed: No Electronic Signature(s) Signed: 03/24/2020 5:20:20 PM By: Minerva Fester Signed: 03/26/2020 5:45:26 PM By: [name] Hurst RN, BSN Entered By: Minerva Fester on 03/24/2020 16:27:34 -------------------------------------------------------------------------------- Wound Assessment Details Patient Name: Date of Service: CA RA [name] 03/24/2020 9:00 A M Medical Record Number: [id number] Patient Account Number: [id number] Date of Birth/Sex: Treating RN: March 26, 1966 (54 y.o. [name] Primary Care Chick Cousins: [name] Other Clinician: Referring Maxine Huynh: [name] Hanifah Royse/Extender: [name] Wound Status Wound Number: 19 Primary Etiology: Pressure Ulcer  Wound Location: Sacrum Wound Status: Open Wounding Event: Pressure Injury Comorbid History: Anemia, Hypertension, Colitis, Osteomyelitis, Paraplegia Date Acquired: 10/02/2013 Weeks Of Treatment: [name] Clustered Wound: No Photos Wound Measurements Length: (cm) 1 Width: (cm) 0.8 Depth: (cm) 1.5 Area: (cm) 0.628 Volume: (cm) 0.942 % Reduction in Area: 70.6% % Reduction in Volume: 66.1% Epithelialization: Medium (34-66%) Tunneling:  No Undermining: Yes Starting Position (o'clock): 12 Ending Position (o'clock): 12 Maximum Distance: (cm) 2.2 Wound Description Classification: Category/Stage IV Wound Margin: Fibrotic scar, thickened scar Exudate Amount: Medium Exudate Type: Serosanguineous Exudate Color: red, brown Foul Odor After Cleansing: No Slough/Fibrino No Wound Bed Granulation Amount: Large (67-100%) Exposed Structure Granulation Quality: Red, Friable Fascia Exposed: No Necrotic Amount: None Present (0%) Fat Layer (Subcutaneous Tissue) Exposed: Yes Tendon Exposed: No Muscle Exposed: No Joint Exposed: No Bone Exposed: No Treatment Notes Wound #19 (Sacrum) 1. Cleanse With Wound Cleanser 3. Primary Dressing Applied Calcium Alginate Ag 4. Secondary Dressing ABD Pad 5. Secured With Recruitment consultant) Signed: 03/24/2020 5:20:20 PM By: Minerva Fester Signed: 03/26/2020 5:45:26 PM By: [name] Hurst RN, BSN Entered By: Minerva Fester on 03/24/2020 16:49:36 -------------------------------------------------------------------------------- Wound Assessment Details Patient Name: Date of Service: CA RA [name] 03/24/2020 9:00 A M Medical Record Number: [id number] Patient Account Number: [id number] Date of Birth/Sex: Treating RN: 06-19-1966 (55 y.o. [name] Primary Care Marticia Reifschneider: [name] Other Clinician: Referring Carizma Dunsworth: [name] Dvaughn Fickle/Extender: [name] Wound Status Wound Number: 21 Primary Etiology: Pressure Ulcer Wound Location: Right, Medial Ischium Wound Status: Open Wounding Event: Gradually Appeared Comorbid History: Anemia, Hypertension, Colitis, Osteomyelitis, Paraplegia Date Acquired: 10/02/2012 Weeks Of Treatment: 103 Clustered Wound: No Photos Wound Measurements Length: (cm) 0.3 Width: (cm) 0.3 Depth: (cm) 2.6 Area: (cm) 0.071 Volume: (cm) 0.184 % Reduction in Area: 97.5% % Reduction in Volume:  96.7% Epithelialization: Small (1-33%) Tunneling: No Undermining: No Wound Description Classification: Category/Stage II Wound Margin: Well defined, not attached Exudate Amount: Small Exudate Type: Serosanguineous Exudate Color: red, brown Foul Odor After Cleansing: No Slough/Fibrino No Wound Bed Granulation Amount: Large (67-100%) Exposed Structure Granulation Quality: Pink Fascia Exposed: No Necrotic Amount: None Present (0%) Fat Layer (Subcutaneous Tissue) Exposed: Yes Tendon Exposed: No Muscle Exposed: No Joint Exposed: No Bone Exposed: No Treatment Notes Wound #21 (Right, Medial Ischium) 1. Cleanse With Wound Cleanser 3. Primary Dressing Applied Calcium Alginate Ag 4. Secondary Dressing ABD Pad 5. Secured With Recruitment consultant) Signed: 03/24/2020 5:20:20 PM By: Minerva Fester Signed: 03/26/2020 5:45:26 PM By: [name] Hurst RN, BSN Entered By: Minerva Fester on 03/24/2020 16:48:44 -------------------------------------------------------------------------------- Wound Assessment Details Patient Name: Date of Service: CA RA [name] 03/24/2020 9:00 A M Medical Record Number: [id number] Patient Account Number: [id number] Date of Birth/Sex: Treating RN: April 11, 1966 (54 y.o. [name] Primary Care Abdirahman Chittum: [name] Other Clinician: Referring Phelan Goers: [name] Anysia Choi/Extender: [name] Wound Status Wound Number: 22 Primary Etiology: Pressure Ulcer Wound Location: Left Ischium Wound Status: Open Wounding Event: Gradually Appeared Comorbid History: Anemia, Hypertension, Colitis, Osteomyelitis, Paraplegia Date Acquired: 10/02/2013 Weeks Of Treatment: 103 Clustered Wound: No Photos Wound Measurements Length: (cm) 2 % Redu Width: (cm) 1 % Redu Depth: (cm) 3 Epithe Area: (cm) 1.571 [place] Volume: (cm) 4.712 Under Sta End Max ction in Area: -270.5% ction in Volume:  -296.6% lialization: Small (1-33%) ling: No mining: Yes rting Position (o'clock): 6 ing Position (o'clock): 12 imum Distance: (cm) 4.5 Wound Description Classification: Category/Stage II Foul Wound Margin: Epibole  Sloug Exudate Amount: Medium Exudate Type: Serosanguineous Exudate Color: red, brown Odor After Cleansing: No h/Fibrino No Wound Bed Granulation Amount: Large (67-100%) Exposed Structure Granulation Quality: Pink Fascia Exposed: No Necrotic Amount: None Present (0%) Fat Layer (Subcutaneous Tissue) Exposed: Yes Tendon Exposed: No Muscle Exposed: No Joint Exposed: No Bone Exposed: No Treatment Notes Wound #22 (Left Ischium) 1. Cleanse With Wound Cleanser 3. Primary Dressing Applied Calcium Alginate Ag 4. Secondary Dressing ABD Pad 5. Secured With Recruitment consultant) Signed: 03/24/2020 5:20:20 PM By: Minerva Fester Signed: 03/26/2020 5:45:26 PM By: [name] Hurst RN, BSN Entered By: Minerva Fester on 03/24/2020 16:27:03 -------------------------------------------------------------------------------- Wound Assessment Details Patient Name: Date of Service: CA RA [name] 03/24/2020 9:00 A M Medical Record Number: [id number] Patient Account Number: [id number] Date of Birth/Sex: Treating RN: 1965/12/10 (54 y.o. [name] Primary Care Zarah Carbon: [name] Other Clinician: Referring Casey Fye: [name] Chari Parmenter/Extender: [name] Wound Status Wound Number: 23 Primary Etiology: Pressure Ulcer Wound Location: Right Gluteus Wound Status: Open Wounding Event: Gradually Appeared Comorbid History: Anemia, Hypertension, Colitis, Osteomyelitis, Paraplegia Date Acquired: 09/18/2018 Weeks Of Treatment: 76 Clustered Wound: No Photos Wound Measurements Length: (cm) 0.2 Width: (cm) 0.2 Depth: (cm) 3 Area: (cm) 0.031 Volume: (cm) 0.094 % Reduction in Area: 91% % Reduction in Volume:  90.3% Epithelialization: Small (1-33%) Tunneling: No Undermining: No Wound Description Classification: Category/Stage II Wound Margin: Well defined, not attached Exudate Amount: Small Exudate Type: Serosanguineous Exudate Color: red, brown Foul Odor After Cleansing: No Slough/Fibrino No Wound Bed Granulation Amount: Large (67-100%) Exposed Structure Granulation Quality: Pink Fascia Exposed: No Necrotic Amount: None Present (0%) Fat Layer (Subcutaneous Tissue) Exposed: Yes Tendon Exposed: No Muscle Exposed: No Joint Exposed: No Bone Exposed: No Treatment Notes Wound #23 (Right Gluteus) 1. Cleanse With Wound Cleanser 3. Primary Dressing Applied Calcium Alginate Ag 4. Secondary Dressing ABD Pad 5. Secured With Recruitment consultant) Signed: 03/24/2020 5:20:20 PM By: Minerva Fester Signed: 03/26/2020 5:45:26 PM By: [name] Hurst RN, BSN Entered By: Minerva Fester on 03/24/2020 16:49:11 -------------------------------------------------------------------------------- [place] Details Patient Name: Date of Service: CA RA [name] 03/24/2020 9:00 A M Medical Record Number: [id number] Patient Account Number: [id number] Date of Birth/Sex: Treating RN: 07-25-1966 (54 y.o. [name] Primary Care Mayte Diers: [name] Other Clinician: Referring Luqman Perrelli: [name] Shonice Wrisley/Extender: [name] Vital Signs Time Taken: 09:28 Temperature (F): 98.5 Height (in): 72 Pulse (bpm): 82 Weight (lbs): 215 Respiratory Rate (breaths/min): 18 Body Mass Index (BMI): 29.2 Blood Pressure (mmHg): 157/76 Reference Range: 80 - 120 mg / dl Electronic Signature(s) Signed: 03/26/2020 5:45:26 PM By: [name] Hurst RN, BSN Entered By: [name] on 03/24/2020 09:29:10

## 2020-04-21 ENCOUNTER — Encounter (HOSPITAL_BASED_OUTPATIENT_CLINIC_OR_DEPARTMENT_OTHER): Payer: Medicare Other | Attending: Physician Assistant | Admitting: Physician Assistant

## 2020-04-21 ENCOUNTER — Other Ambulatory Visit: Payer: Self-pay

## 2020-04-21 DIAGNOSIS — Z6829 Body mass index (BMI) 29.0-29.9, adult: Secondary | ICD-10-CM | POA: Insufficient documentation

## 2020-04-21 DIAGNOSIS — F17218 Nicotine dependence, cigarettes, with other nicotine-induced disorders: Secondary | ICD-10-CM | POA: Diagnosis not present

## 2020-04-21 DIAGNOSIS — L89314 Pressure ulcer of right buttock, stage 4: Secondary | ICD-10-CM | POA: Insufficient documentation

## 2020-04-21 DIAGNOSIS — L89893 Pressure ulcer of other site, stage 3: Secondary | ICD-10-CM | POA: Diagnosis not present

## 2020-04-21 DIAGNOSIS — G8222 Paraplegia, incomplete: Secondary | ICD-10-CM | POA: Diagnosis not present

## 2020-04-21 DIAGNOSIS — L89154 Pressure ulcer of sacral region, stage 4: Secondary | ICD-10-CM | POA: Insufficient documentation

## 2020-04-21 DIAGNOSIS — L89324 Pressure ulcer of left buttock, stage 4: Secondary | ICD-10-CM | POA: Diagnosis not present

## 2020-04-21 DIAGNOSIS — L89892 Pressure ulcer of other site, stage 2: Secondary | ICD-10-CM | POA: Diagnosis not present

## 2020-04-21 DIAGNOSIS — E669 Obesity, unspecified: Secondary | ICD-10-CM | POA: Insufficient documentation

## 2020-04-21 NOTE — Progress Notes (Addendum)
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 worsens or changes patient will contact our office for additional recommendations. Electronic Signature(s) Signed: 04/21/2020 10:09:55 AM By: [name] Keeler PA-C Entered By: [name] Keeler on 04/21/2020  10:09:55 -------------------------------------------------------------------------------- SuperBill Details Patient Name: Date of Service: [place] RA [place], [name] 04/21/2020 Medical Record Number: [id number] Patient Account Number: [id number] Date of Birth/Sex: Treating RN: 1965/11/03 (54 y.o. 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

## 2020-05-19 ENCOUNTER — Encounter (HOSPITAL_BASED_OUTPATIENT_CLINIC_OR_DEPARTMENT_OTHER): Payer: Medicare Other | Admitting: Physician Assistant

## 2020-05-26 ENCOUNTER — Encounter (HOSPITAL_BASED_OUTPATIENT_CLINIC_OR_DEPARTMENT_OTHER): Payer: Medicare Other | Attending: Physician Assistant | Admitting: Physician Assistant

## 2020-05-26 DIAGNOSIS — L89154 Pressure ulcer of sacral region, stage 4: Secondary | ICD-10-CM | POA: Diagnosis not present

## 2020-05-26 DIAGNOSIS — L89324 Pressure ulcer of left buttock, stage 4: Secondary | ICD-10-CM | POA: Insufficient documentation

## 2020-05-26 DIAGNOSIS — L89892 Pressure ulcer of other site, stage 2: Secondary | ICD-10-CM | POA: Diagnosis not present

## 2020-05-26 DIAGNOSIS — L89893 Pressure ulcer of other site, stage 3: Secondary | ICD-10-CM | POA: Diagnosis not present

## 2020-05-26 DIAGNOSIS — G8222 Paraplegia, incomplete: Secondary | ICD-10-CM | POA: Diagnosis not present

## 2020-05-26 DIAGNOSIS — L89314 Pressure ulcer of right buttock, stage 4: Secondary | ICD-10-CM | POA: Diagnosis not present

## 2020-05-26 DIAGNOSIS — F17210 Nicotine dependence, cigarettes, uncomplicated: Secondary | ICD-10-CM | POA: Diagnosis not present

## 2020-05-26 NOTE — Progress Notes (Addendum)
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 of the heart or above for 30 minutes daily and/or when sitting, a frequency of: - throught the day Off-Loading: Turn and reposition every 2 hours Other: - up in chair no more than 2 hour  increments 1. I would recommend that we go and continue with the wound care measures as before specifically with regard to the silver alginate though he is not like talking this and like I would like for him to be just laying over top. 2. I am also can recommend that we have the patient continue to be monitored for any signs of worsening infection we do see him once a month to this and to ensure that nothing is getting any worse. 3. I am also can recommend appropriate and continued offloading to keep pressure off of the gluteal region. We will see patient back for reevaluation in 4 weeks here in the clinic. If anything worsens or changes patient will contact our  office for additional recommendations. Electronic Signature(s) Signed: 05/26/2020 10:21:06 AM By: [name] Keeler PA-C Entered By: [name] Keeler on 05/26/2020 10:21:06 -------------------------------------------------------------------------------- SuperBill Details Patient Name: Date of Service: CA RA [place], [name] 05/26/2020 Medical Record Number: [id number] Patient Account Number: [id number] Date of Birth/Sex: Treating RN: [birth year] (54 y.o. 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 Nicotine dependence, cigarettes, with other nicotine-induced disorders Facility Procedures CPT4 Code: [id number] Description: [phone] - WOUND CARE VISIT-LEV 5 EST PT Modifier: Quantity: 1 Physician Procedures : CPT4 Code Description Modifier [id number] - WC PHYS LEVEL 3 - EST PT ICD-10  Diagnosis Description L89.324 Pressure ulcer of left buttock, stage 4 L89.154 Pressure ulcer of sacral region, stage 4 L89.314 Pressure ulcer of right buttock, stage 4  L89.892 Pressure ulcer of other site, stage 2 Quantity: 1 Electronic Signature(s) Signed: 05/26/2020 10:21:19 AM By: [name] Keeler PA-C Entered By: [name] Keeler on 05/26/2020 10:21:17

## 2020-06-22 NOTE — Progress Notes (Signed)
[name], [name] ([id number]) Visit Report for 04/21/2020 Arrival Information Details Patient Name: Date of Service: CA RA [name] 04/21/2020 9:00 A M Medical Record Number: [id number] Patient Account Number: [id number] Date of Birth/Sex: Treating RN: 1966-04-07 (54 y.o. [name] Primary Care [name]: [name] Other Clinician: Referring [name]: Treating [name]/Extender: [name] in Treatment: 40 Visit Information History Since Last Visit Added or deleted any medications: No Patient Arrived: Wheel Chair Any new allergies or adverse reactions: No Arrival Time: 09:15 Had a fall or experienced change in No Accompanied By: caregiver activities of daily living that may affect Transfer Assistance: None risk of falls: Patient Identification Verified: Yes Signs or symptoms of abuse/neglect since last visito No Secondary Verification Process Completed: Yes Hospitalized since last visit: No Patient Requires Transmission-Based Precautions: No Implantable device outside of the clinic excluding No Patient Has Alerts: No cellular tissue based products placed in the center since last visit: Has Dressing in Place as Prescribed: Yes Pain Present Now: Yes Electronic Signature(s) Signed: 06/22/2020 8:09:04 AM By: Sandre Kitty Entered By: Sandre Kitty on 04/21/2020 09:16:14 -------------------------------------------------------------------------------- Clinic Level of Care Assessment Details Patient Name: Date of Service: [name] 04/21/2020 9:00 A M Medical Record Number: [id number] Patient Account Number: [id number] Date of Birth/Sex: Treating RN: January 19, 1966 (54 y.o. [name] Primary Care Oral Remache: [name] Other Clinician: Referring [name]: Treating [name]/Extender: [name] in Treatment: 142 Clinic Level of Care Assessment Items TOOL 4 Quantity Score []  -  0 Use when only an EandM is performed on FOLLOW-UP visit ASSESSMENTS - Nursing Assessment / Reassessment X- 1 10 Reassessment of Co-morbidities (includes updates in patient status) X- 1 5 Reassessment of Adherence to Treatment Plan ASSESSMENTS - Wound and Skin A ssessment / Reassessment []  - 0 Simple Wound Assessment / Reassessment - one wound X- 4 5 Complex Wound Assessment / Reassessment - multiple wounds []  - 0 Dermatologic / Skin Assessment (not related to wound area) ASSESSMENTS - Focused Assessment []  - 0 Circumferential Edema Measurements - multi extremities []  - 0 Nutritional Assessment / Counseling / Intervention []  - 0 Lower Extremity Assessment (monofilament, tuning fork, pulses) []  - 0 Peripheral Arterial Disease Assessment (using hand held doppler) ASSESSMENTS - Ostomy and/or Continence Assessment and Care []  - 0 Incontinence Assessment and Management []  - 0 Ostomy Care Assessment and Management (repouching, etc.) PROCESS - Coordination of Care X - Simple Patient / Family Education for ongoing care 1 15 []  - 0 Complex (extensive) Patient / Family Education for ongoing care X- 1 10 Staff obtains Programmer, systems, Records, T Results / Process Orders est []  - 0 Staff telephones HHA, Nursing Homes / Clarify orders / etc []  - 0 Routine Transfer to another Facility (non-emergent condition) []  - 0 Routine Hospital Admission (non-emergent condition) []  - 0 New Admissions / Biomedical engineer / Ordering NPWT Apligraf, etc. , []  - 0 Emergency Hospital Admission (emergent condition) X- 1 10 Simple Discharge Coordination []  - 0 Complex (extensive) Discharge Coordination PROCESS - Special Needs []  - 0 Pediatric / Minor Patient Management []  - 0 Isolation Patient Management []  - 0 Hearing / Language / Visual special needs []  - 0 Assessment of Community assistance (transportation, D/C planning, etc.) []  - 0 Additional assistance / Altered mentation []  -  0 Support Surface(s) Assessment (bed, cushion, seat, etc.) INTERVENTIONS - Wound Cleansing / Measurement []  - 0 Simple Wound Cleansing - one wound X- 4 5 Complex Wound Cleansing -  multiple wounds X- 1 5 Wound Imaging (photographs - any number of wounds) []  - 0 Wound Tracing (instead of photographs) []  - 0 Simple Wound Measurement - one wound X- 4 5 Complex Wound Measurement - multiple wounds INTERVENTIONS - Wound Dressings X - Small Wound Dressing one or multiple wounds 4 10 []  - 0 Medium Wound Dressing one or multiple wounds []  - 0 Large Wound Dressing one or multiple wounds X- 1 5 Application of Medications - topical []  - 0 Application of Medications - injection INTERVENTIONS - Miscellaneous []  - 0 External ear exam []  - 0 Specimen Collection (cultures, biopsies, blood, body fluids, etc.) []  - 0 Specimen(s) / Culture(s) sent or taken to Lab for analysis []  - 0 Patient Transfer (multiple staff / Civil Service fast streamer / Similar devices) []  - 0 Simple Staple / Suture removal (25 or less) []  - 0 Complex Staple / Suture removal (26 or more) []  - 0 Hypo / Hyperglycemic Management (close monitor of Blood Glucose) []  - 0 Ankle / Brachial Index (ABI) - do not check if billed separately X- 1 5 Vital Signs Has the patient been seen at the hospital within the last three years: Yes Total Score: 165 Level Of Care: New/Established - Level 5 Electronic Signature(s) Signed: 04/22/2020 4:56:58 PM By: Baruch Gouty RN, BSN Entered By: Baruch Gouty on 04/21/2020 10:04:21 -------------------------------------------------------------------------------- Encounter Discharge Information Details Patient Name: Date of Service: [place] RA [place], [name] 04/21/2020 9:00 A M Medical Record Number: [id number] Patient Account Number: [id number] Date of Birth/Sex: Treating RN: 1966/03/05 (54 y.o. [name] Primary Care Mykal Kirchman: [name] Other Clinician: Referring Tineshia Becraft: Treating  Runell Kovich/Extender: [name] Weeks in Treatment: [phone] Encounter Discharge Information Items Discharge Condition: Stable Ambulatory Status: Wheelchair Discharge Destination: Home Transportation: Private Auto Accompanied By: self Schedule Follow-up Appointment: Yes Clinical Summary of Care: Patient Declined Electronic Signature(s) Signed: 04/23/2020 5:42:58 PM By: Carlene Coria RN Entered By: Carlene Coria on 04/21/2020 10:24:32 -------------------------------------------------------------------------------- [place] Details Patient Name: Date of Service: CA RA [place], [name] 04/21/2020 9:00 A M Medical Record Number: [id number] Patient Account Number: [id number] Date of Birth/Sex: Treating RN: [birth year] (54 y.o. Ulyses Amor, Vaughan Basta Primary Care Fahed Morten: [name] Other Clinician: Referring Ruhi Kopke: Treating Manal Kreutzer/Extender: [name] in Treatment: [phone] Active Inactive Pressure Nursing Diagnoses: Knowledge deficit related to causes and risk factors for pressure ulcer development Knowledge deficit related to management of pressures ulcers Potential for impaired tissue integrity related to pressure, friction, moisture, and shear Goals: Patient will remain free from development of additional pressure ulcers Date Initiated: 12/12/2017 Date Inactivated: 08/14/2018 Target Resolution Date: 08/21/2018 Goal Status: Unmet Unmet Reason: hospitalized Patient will remain free of pressure ulcers Date Initiated: 12/12/2017 Date Inactivated: 02/06/2018 Target Resolution Date: 02/06/2018 Unmet Reason: pt noncompliant wth Goal Status: Unmet offloading Patient/caregiver will verbalize understanding of pressure ulcer management Date Initiated: 12/12/2017 Target Resolution Date: 05/19/2020 Goal Status: Active Interventions: Assess: immobility, friction, shearing, incontinence upon admission and as needed Assess offloading  mechanisms upon admission and as needed Assess potential for pressure ulcer upon admission and as needed Notes: Wound/Skin Impairment Nursing Diagnoses: Impaired tissue integrity Goals: Patient/caregiver will verbalize understanding of skin care regimen Date Initiated: 03/06/2018 Target Resolution Date: 05/19/2020 Goal Status: Active Ulcer/skin breakdown will heal within 14 weeks Date Initiated: 07/31/2017 Date Inactivated: 01/09/2018 Target Resolution Date: 11/30/2017 Unmet Reason: noncompliant with Goal Status: Unmet offloading Interventions: Assess patient/caregiver ability to perform ulcer/skin care regimen upon admission and as needed Notes:  Electronic Signature(s) Signed: 04/22/2020 4:56:58 PM By: Baruch Gouty RN, BSN Entered By: Baruch Gouty on 04/21/2020 09:06:06 -------------------------------------------------------------------------------- Pain Assessment Details Patient Name: Date of Service: [name], [name] 04/21/2020 9:00 A M Medical Record Number: [id number] Patient Account Number: [id number] Date of Birth/Sex: Treating RN: [birth year] (54 y.o. [name] Primary Care Magdala Brahmbhatt: [name] Other Clinician: Referring Vernica Wachtel: Treating Maecie Sevcik/Extender: [name] Weeks in Treatment: [phone] Active Problems Location of Pain Severity and Description of Pain Patient Has Paino Yes Site Locations Rate the pain. Current Pain Level: 8 Pain Management and Medication Current Pain Management: Electronic Signature(s) Signed: 04/22/2020 4:56:58 PM By: Baruch Gouty RN, BSN Signed: 06/22/2020 8:09:04 AM By: Sandre Kitty Entered By: Sandre Kitty on 04/21/2020 09:16:48 -------------------------------------------------------------------------------- Patient/Caregiver Education Details Patient Name: Date of Service: CA RA [name], [name] 7/21/2021andnbsp9:00 A M Medical Record Number: [id number] Patient Account Number:  [id number] Date of Birth/Gender: Treating RN: [birth year] (54 y.o. [name] Primary Care Physician: [name] Other Clinician: Referring Physician: Treating Physician/Extender: [name] in Treatment: [phone] Education Assessment Education Provided To: Patient Education Topics Provided Pressure: Methods: Explain/Verbal Responses: Reinforcements needed, State content correctly Wound/Skin Impairment: Methods: Explain/Verbal Responses: Reinforcements needed, State content correctly Electronic Signature(s) Signed: 04/22/2020 4:56:58 PM By: Baruch Gouty RN, BSN Entered By: Baruch Gouty on 04/21/2020 09:06:27 -------------------------------------------------------------------------------- Wound Assessment Details Patient Name: Date of Service: [name], [name] 04/21/2020 9:00 A M Medical Record Number: [id number] Patient Account Number: [id number] Date of Birth/Sex: Treating RN: Jan 11, 1966 (54 y.o. [name] Primary Care Sabella Traore: [name] Other Clinician: Referring Quanah Majka: Treating Ingris Pasquarella/Extender: [name] Weeks in Treatment: 142 Wound Status Wound Number: 19 Primary Etiology: Pressure Ulcer Wound Location: Sacrum Wound Status: Open Wounding Event: Pressure Injury Comorbid History: Anemia, Hypertension, Colitis, Osteomyelitis, Paraplegia Date Acquired: 10/02/2013 Weeks Of Treatment: 142 Clustered Wound: No Photos Photo Uploaded By: Mikeal Hawthorne on 04/21/2020 15:05:42 Wound Measurements Length: (cm) 1 Width: (cm) 1 Depth: (cm) 0.9 Area: (cm) 0.785 Volume: (cm) 0.707 % Reduction in Area: 63.2% % Reduction in Volume: 74.5% Epithelialization: Medium (34-66%) Tunneling: No Undermining: Yes Starting Position (o'clock): 12 Ending Position (o'clock): 12 Maximum Distance: (cm) 2 Wound Description Classification: Category/Stage IV Wound Margin: Fibrotic scar, thickened  scar Exudate Amount: Medium Exudate Type: Serosanguineous Exudate Color: red, brown Foul Odor After Cleansing: No Slough/Fibrino No Wound Bed Granulation Amount: Large (67-100%) Exposed Structure Granulation Quality: Red, Friable Fascia Exposed: No Necrotic Amount: None Present (0%) Fat Layer (Subcutaneous Tissue) Exposed: Yes Tendon Exposed: No Muscle Exposed: No Joint Exposed: No Bone Exposed: No Electronic Signature(s) Signed: 04/22/2020 4:56:58 PM By: Baruch Gouty RN, BSN Signed: 04/22/2020 5:02:14 PM By: Levan Hurst RN, BSN Entered By: Levan Hurst on 04/21/2020 09:39:55 -------------------------------------------------------------------------------- Wound Assessment Details Patient Name: Date of Service: [name], [name] 04/21/2020 9:00 A M Medical Record Number: [id number] Patient Account Number: [id number] Date of Birth/Sex: Treating RN: Nov 04, 1965 (54 y.o. [name] Primary Care Glendy Barsanti: [name] Other Clinician: Referring Pietrina Jagodzinski: Treating Danil Wedge/Extender: [name] Weeks in Treatment: 142 Wound Status Wound Number: 21 Primary Etiology: Pressure Ulcer Wound Location: Right, Medial Ischium Wound Status: Open Wounding Event: Gradually Appeared Comorbid History: Anemia, Hypertension, Colitis, Osteomyelitis, Paraplegia Date Acquired: 10/02/2012 Weeks Of Treatment: 107 Clustered Wound: No Photos Photo Uploaded By: Mikeal Hawthorne on 04/21/2020 15:06:13 Wound Measurements Length: (cm) 0.3 Width: (cm) 0.3 Depth: (cm) 2.5 Area: (cm) 0.071 Volume: (cm) 0.177 % Reduction in Area:  97.5% % Reduction in Volume: 96.9% Epithelialization: Medium (34-66%) Tunneling: No Undermining: No Wound Description Classification: Category/Stage II Wound Margin: Well defined, not attached Exudate Amount: Small Exudate Type: Serosanguineous Exudate Color: red, brown Foul Odor After Cleansing: No Slough/Fibrino No Wound  Bed Granulation Amount: Large (67-100%) Exposed Structure Granulation Quality: Pink, Pale Fascia Exposed: No Necrotic Amount: None Present (0%) Fat Layer (Subcutaneous Tissue) Exposed: Yes Tendon Exposed: No Muscle Exposed: No Joint Exposed: No Bone Exposed: No Electronic Signature(s) Signed: 04/22/2020 4:56:58 PM By: Baruch Gouty RN, BSN Signed: 04/22/2020 5:02:14 PM By: Levan Hurst RN, BSN Entered By: Levan Hurst on 04/21/2020 09:40:23 -------------------------------------------------------------------------------- Wound Assessment Details Patient Name: Date of Service: [name], [name] 04/21/2020 9:00 A M Medical Record Number: [id number] Patient Account Number: [id number] Date of Birth/Sex: Treating RN: [birth year] (54 y.o. [name] Primary Care Nayef [employer]: [name] Other Clinician: Referring Kerrington Greenhalgh: Treating Tzipporah Nagorski/Extender: [name] Weeks in Treatment: 142 Wound Status Wound Number: 22 Primary Etiology: Pressure Ulcer Wound Location: Left Ischium Wound Status: Open Wounding Event: Gradually Appeared Comorbid History: Anemia, Hypertension, Colitis, Osteomyelitis, Paraplegia Date Acquired: 10/02/2013 Weeks Of Treatment: 107 Clustered Wound: No Photos Photo Uploaded By: Mikeal Hawthorne on 04/21/2020 15:05:42 Wound Measurements Length: (cm) 2 Width: (cm) 1 Depth: (cm) 3.5 Area: (cm) 1.571 Volume: (cm) 5.498 % Reduction in Area: -270.5% % Reduction in Volume: -362.8% Epithelialization: Small (1-33%) Tunneling: No Undermining: Yes Starting Position (o'clock): 6 Ending Position (o'clock): 12 Maximum Distance: (cm) 4.5 Wound Description Classification: Category/Stage II Wound Margin: Epibole Exudate Amount: Medium Exudate Type: Serosanguineous Exudate Color: red, brown Foul Odor After Cleansing: No Slough/Fibrino No Wound Bed Granulation Amount: Large (67-100%) Exposed Structure Granulation Quality:  Pink Fascia Exposed: No Necrotic Amount: None Present (0%) Fat Layer (Subcutaneous Tissue) Exposed: Yes Tendon Exposed: No Muscle Exposed: No Joint Exposed: No Bone Exposed: No Electronic Signature(s) Signed: 04/22/2020 4:56:58 PM By: Baruch Gouty RN, BSN Signed: 04/22/2020 5:02:14 PM By: Levan Hurst RN, BSN Entered By: Levan Hurst on 04/21/2020 09:40:59 -------------------------------------------------------------------------------- Wound Assessment Details Patient Name: Date of Service: [name], [name] 04/21/2020 9:00 A M Medical Record Number: [id number] Patient Account Number: [id number] Date of Birth/Sex: Treating RN: April 09, 1966 (54 y.o. [name] Primary Care Zigmond Trela: [name] Other Clinician: Referring Jaynie Hitch: Treating Stryker Veasey/Extender: [name] Weeks in Treatment: 142 Wound Status Wound Number: 23 Primary Etiology: Pressure Ulcer Wound Location: Right Gluteus Wound Status: Open Wounding Event: Gradually Appeared Comorbid History: Anemia, Hypertension, Colitis, Osteomyelitis, Paraplegia Date Acquired: 09/18/2018 Weeks Of Treatment: 80 Clustered Wound: No Photos Photo Uploaded By: Mikeal Hawthorne on 04/21/2020 15:06:14 Wound Measurements Length: (cm) 0.2 Width: (cm) 0.2 Depth: (cm) 3.2 Area: (cm) 0.031 Volume: (cm) 0.101 % Reduction in Area: 91% % Reduction in Volume: 89.6% Epithelialization: Small (1-33%) Tunneling: No Undermining: No Wound Description Classification: Category/Stage II Wound Margin: Well defined, not attached Exudate Amount: Small Exudate Type: Serosanguineous Exudate Color: red, brown Foul Odor After Cleansing: No Slough/Fibrino No Wound Bed Granulation Amount: Large (67-100%) Exposed Structure Granulation Quality: Pink Fascia Exposed: No Necrotic Amount: None Present (0%) Fat Layer (Subcutaneous Tissue) Exposed: Yes Tendon Exposed: No Muscle Exposed: No Joint Exposed:  No Bone Exposed: No Electronic Signature(s) Signed: 04/22/2020 4:56:58 PM By: Baruch Gouty RN, BSN Signed: 04/22/2020 5:02:14 PM By: Levan Hurst RN, BSN Entered By: Levan Hurst on 04/21/2020 09:41:24 -------------------------------------------------------------------------------- [place] Details Patient Name: Date of Service: [name], [name] 04/21/2020 9:00 A M Medical Record Number: [id number] Patient Account Number:  [id number] Date of Birth/Sex: Treating RN: Sep 19, 1966 (54 y.o. [name] Primary Care Zauria Dombek: [name] Other Clinician: Referring Rozell Kettlewell: Treating Sharaya Boruff/Extender: [name] Weeks in Treatment: 142 Vital Signs Time Taken: 09:16 Temperature (F): 98.0 Height (in): 72 Pulse (bpm): 80 Weight (lbs): 215 Respiratory Rate (breaths/min): 18 Body Mass Index (BMI): 29.2 Blood Pressure (mmHg): 135/82 Reference Range: 80 - 120 mg / dl Electronic Signature(s) Signed: 06/22/2020 8:09:04 AM By: Sandre Kitty Entered By: Sandre Kitty on 04/21/2020 09:16:36

## 2020-06-23 ENCOUNTER — Encounter (HOSPITAL_BASED_OUTPATIENT_CLINIC_OR_DEPARTMENT_OTHER): Payer: Medicare Other | Admitting: Physician Assistant

## 2020-07-07 ENCOUNTER — Encounter (HOSPITAL_BASED_OUTPATIENT_CLINIC_OR_DEPARTMENT_OTHER): Payer: Medicare Other | Admitting: Physician Assistant

## 2020-08-04 ENCOUNTER — Encounter (HOSPITAL_BASED_OUTPATIENT_CLINIC_OR_DEPARTMENT_OTHER): Payer: Medicare Other | Attending: Physician Assistant | Admitting: Physician Assistant

## 2020-08-04 ENCOUNTER — Other Ambulatory Visit: Payer: Self-pay

## 2020-08-04 DIAGNOSIS — K219 Gastro-esophageal reflux disease without esophagitis: Secondary | ICD-10-CM | POA: Insufficient documentation

## 2020-08-04 DIAGNOSIS — L89892 Pressure ulcer of other site, stage 2: Secondary | ICD-10-CM | POA: Diagnosis not present

## 2020-08-04 DIAGNOSIS — G894 Chronic pain syndrome: Secondary | ICD-10-CM | POA: Insufficient documentation

## 2020-08-04 DIAGNOSIS — G8222 Paraplegia, incomplete: Secondary | ICD-10-CM | POA: Insufficient documentation

## 2020-08-04 DIAGNOSIS — L89893 Pressure ulcer of other site, stage 3: Secondary | ICD-10-CM | POA: Insufficient documentation

## 2020-08-04 DIAGNOSIS — L89314 Pressure ulcer of right buttock, stage 4: Secondary | ICD-10-CM | POA: Insufficient documentation

## 2020-08-04 DIAGNOSIS — L89324 Pressure ulcer of left buttock, stage 4: Secondary | ICD-10-CM | POA: Insufficient documentation

## 2020-08-04 DIAGNOSIS — Z933 Colostomy status: Secondary | ICD-10-CM | POA: Insufficient documentation

## 2020-08-04 DIAGNOSIS — Z6829 Body mass index (BMI) 29.0-29.9, adult: Secondary | ICD-10-CM | POA: Diagnosis not present

## 2020-08-04 DIAGNOSIS — L89154 Pressure ulcer of sacral region, stage 4: Secondary | ICD-10-CM | POA: Diagnosis not present

## 2020-08-04 DIAGNOSIS — F17218 Nicotine dependence, cigarettes, with other nicotine-induced disorders: Secondary | ICD-10-CM | POA: Diagnosis not present

## 2020-08-04 DIAGNOSIS — I10 Essential (primary) hypertension: Secondary | ICD-10-CM | POA: Diagnosis not present

## 2020-08-04 NOTE — Progress Notes (Addendum)
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 Wound #19 Sacrum: ABD pad Wound #21 Right,Medial Ischium: ABD pad Wound #22 Left Ischium: ABD pad Wound #23 Right Gluteus: ABD pad Edema Control: Elevate legs to the level of the heart or above for 30 minutes daily and/or when sitting, a  frequency of: - throught the day Off-Loading: Turn and reposition every 2 hours Other: - up in chair no more than 2 hour increments 1. Would recommend currently that we continue with the silver alginate. Again as always I would prefer that he were packing this into the wound location but unfortunately he is not. For that reason we will continue as best we can with the silver alginate. 2. I am also can recommend at this time that the patient continue to monitor for any signs of worsening infection. Obviously if he has any issues then he should contact us to let us know otherwise we will continue with just intermittent monitoring to make sure that nothing worsens in general. We will see patient back for reevaluation  in 4 weeks here in the clinic. If anything worsens or changes patient will contact our office for additional recommendations. Electronic Signature(s) Signed: 08/04/2020 10:09:16 AM By: [name] Keeler PA-C Entered By: [name] Keeler on 08/04/2020 10:09:16 -------------------------------------------------------------------------------- SuperBill Details Patient Name: Date of Service: CA RA [place], [name] [name] RRIN 08/04/2020 Medical Record Number: [id number] Patient Account Number: [id number] Date of Birth/Sex: Treating RN: [birth year] (54 y.o. 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 Nicotine dependence, cigarettes, with other nicotine-induced disorders Facility Procedures CPT4 Code: [id number] Description: [phone] - WOUND CARE  VISIT-LEV 5 EST PT Modifier: Quantity: 1 Physician Procedures : CPT4 Code Description Modifier [id number] - WC PHYS LEVEL 3 - EST PT ICD-10 Diagnosis Description L89.324 Pressure ulcer of left buttock, stage 4 L89.314 Pressure ulcer of right buttock, stage 4 L89.154 Pressure ulcer of sacral region, stage 4  L89.892 Pressure ulcer of other site, stage 2 Quantity: 1 Electronic Signature(s) Signed: 08/04/2020 10:09:30 AM By: [name] Keeler PA-C Entered By: [name] Keeler on 08/04/2020 10:09:28

## 2020-08-06 NOTE — Progress Notes (Signed)
CHUKWUEMEKA, ARTOLA ([id number]) Visit Report for 08/04/2020 Arrival Information Details Patient Name: Date of Service: [name] [name] 08/04/2020 9:00 A M Medical Record Number: [id number] Patient Account Number: [id number] Date of Birth/Sex: Treating RN: 1965/12/16 (54 y.o. Janyth Contes Primary Care Tashyra Adduci: [name] Other Clinician: Referring Jaydalyn Demattia: Treating Kamylle Axelson/Extender: [name] in Treatment: 157 Visit Information History Since Last Visit Added or deleted any medications: No Patient Arrived: Wheel Chair Any new allergies or adverse reactions: No Arrival Time: 09:13 Had a fall or experienced change in No Accompanied By: alone activities of daily living that may affect Transfer Assistance: None risk of falls: Patient Identification Verified: Yes Signs or symptoms of abuse/neglect since last visito No Secondary Verification Process Completed: Yes Hospitalized since last visit: No Patient Requires Transmission-Based Precautions: No Implantable device outside of the clinic excluding No Patient Has Alerts: No cellular tissue based products placed in the center since last visit: Has Dressing in Place as Prescribed: Yes Pain Present Now: Yes Electronic Signature(s) Signed: 08/05/2020 5:54:11 PM By: Levan Hurst RN, BSN Entered By: Levan Hurst on 08/04/2020 09:13:26 -------------------------------------------------------------------------------- Clinic Level of Care Assessment Details Patient Name: Date of Service: CA RA [place], [place] 08/04/2020 9:00 [place] Record Number: [id number] Patient Account Number: [id number] Date of Birth/Sex: Treating RN: [birth year] (54 y.o. [name] Primary Care Madeeha Costantino: [name] Other Clinician: Referring Devonda Pequignot: Treating Jerris Keltz/Extender: [name] in Treatment: [place] Level of Care Assessment Items TOOL 4 Quantity Score []  - 0 Use  when only an EandM is performed on FOLLOW-UP visit ASSESSMENTS - Nursing Assessment / Reassessment []  - 0 Reassessment of Co-morbidities (includes updates in patient status) []  - 0 Reassessment of Adherence to Treatment Plan ASSESSMENTS - Wound and Skin A ssessment / Reassessment []  - 0 Simple Wound Assessment / Reassessment - one wound X- 4 5 Complex Wound Assessment / Reassessment - multiple wounds []  - 0 Dermatologic / Skin Assessment (not related to wound area) ASSESSMENTS - Focused Assessment []  - 0 Circumferential Edema Measurements - multi extremities []  - 0 Nutritional Assessment / Counseling / Intervention []  - 0 Lower Extremity Assessment (monofilament, tuning fork, pulses) []  - 0 Peripheral Arterial Disease Assessment (using hand held doppler) ASSESSMENTS - Ostomy and/or Continence Assessment and Care []  - 0 Incontinence Assessment and Management []  - 0 Ostomy Care Assessment and Management (repouching, etc.) PROCESS - Coordination of Care X - Simple Patient / Family Education for ongoing care 1 15 []  - 0 Complex (extensive) Patient / Family Education for ongoing care X- 1 10 Staff obtains Programmer, systems, Records, T Results / Process Orders est []  - 0 Staff telephones HHA, Nursing Homes / Clarify orders / etc []  - 0 Routine Transfer to another Facility (non-emergent condition) []  - 0 Routine Hospital Admission (non-emergent condition) []  - 0 New Admissions / Biomedical engineer / Ordering NPWT Apligraf, etc. , []  - 0 Emergency Hospital Admission (emergent condition) X- 1 10 Simple Discharge Coordination []  - 0 Complex (extensive) Discharge Coordination PROCESS - Special Needs []  - 0 Pediatric / Minor Patient Management []  - 0 Isolation Patient Management []  - 0 Hearing / Language / Visual special needs []  - 0 Assessment of Community assistance (transportation, D/C planning, etc.) []  - 0 Additional assistance / Altered mentation []  - 0 Support  Surface(s) Assessment (bed, cushion, seat, etc.) INTERVENTIONS - Wound Cleansing / Measurement []  - 0 Simple Wound Cleansing - one wound X- 4 5 Complex Wound  Cleansing - multiple wounds X- 1 5 Wound Imaging (photographs - any number of wounds) []  - 0 Wound Tracing (instead of photographs) []  - 0 Simple Wound Measurement - one wound X- 4 5 Complex Wound Measurement - multiple wounds INTERVENTIONS - Wound Dressings X - Small Wound Dressing one or multiple wounds 4 10 []  - 0 Medium Wound Dressing one or multiple wounds []  - 0 Large Wound Dressing one or multiple wounds X- 1 5 Application of Medications - topical []  - 0 Application of Medications - injection INTERVENTIONS - Miscellaneous []  - 0 External ear exam []  - 0 Specimen Collection (cultures, biopsies, blood, body fluids, etc.) []  - 0 Specimen(s) / Culture(s) sent or taken to Lab for analysis []  - 0 Patient Transfer (multiple staff / Civil Service fast streamer / Similar devices) []  - 0 Simple Staple / Suture removal (25 or less) []  - 0 Complex Staple / Suture removal (26 or more) []  - 0 Hypo / Hyperglycemic Management (close monitor of Blood Glucose) []  - 0 Ankle / Brachial Index (ABI) - do not check if billed separately X- 1 5 Vital Signs Has the patient been seen at the hospital within the last three years: Yes Total Score: 150 Level Of Care: New/Established - Level 4 Electronic Signature(s) Signed: 08/04/2020 5:51:45 PM By: Baruch Gouty RN, BSN Entered By: Baruch Gouty on 08/04/2020 09:53:42 -------------------------------------------------------------------------------- Encounter Discharge Information Details Patient Name: Date of Service: CA RA [place], [place] 08/04/2020 9:00 A M Medical Record Number: [id number] Patient Account Number: [id number] Date of Birth/Sex: Treating RN: [birth year] (54 y.o. [name] Primary Care Heatherly Stenner: [name] Other Clinician: Referring Fayola Meckes: Treating Cece Milhouse/Extender:  Seymour Bars Weeks in Treatment: 157 Encounter Discharge Information Items Discharge Condition: Stable Ambulatory Status: Wheelchair Discharge Destination: Home Transportation: Private Auto Accompanied By: self Schedule Follow-up Appointment: Yes Clinical Summary of Care: Patient Declined Electronic Signature(s) Signed: 08/06/2020 5:00:24 PM By: Carlene Coria RN Entered By: Carlene Coria on 08/04/2020 10:15:25 -------------------------------------------------------------------------------- [place] Details Patient Name: Date of Service: CA RA [place], [name] 08/04/2020 9:00 A M Medical Record Number: [id number] Patient Account Number: [id number] Date of Birth/Sex: Treating RN: [birth year] (54 y.o. Ulyses Amor, Vaughan Basta Primary Care Kamsiyochukwu Buist: [name] Other Clinician: Referring Lanette Ell: Treating Soriya Worster/Extender: Seymour Bars Weeks in Treatment: [phone] Active Inactive Pressure Nursing Diagnoses: Knowledge deficit related to causes and risk factors for pressure ulcer development Knowledge deficit related to management of pressures ulcers Potential for impaired tissue integrity related to pressure, friction, moisture, and shear Goals: Patient will remain free from development of additional pressure ulcers Date Initiated: 12/12/2017 Date Inactivated: 08/14/2018 Target Resolution Date: 08/21/2018 Goal Status: Unmet Unmet Reason: hospitalized Patient will remain free of pressure ulcers Date Initiated: 12/12/2017 Date Inactivated: 02/06/2018 Target Resolution Date: 02/06/2018 Unmet Reason: pt noncompliant wth Goal Status: Unmet offloading Patient/caregiver will verbalize understanding of pressure ulcer management Date Initiated: 12/12/2017 Target Resolution Date: 09/01/2020 Goal Status: Active Interventions: Assess: immobility, friction, shearing, incontinence upon admission and as needed Assess offloading mechanisms upon  admission and as needed Assess potential for pressure ulcer upon admission and as needed Notes: Wound/Skin Impairment Nursing Diagnoses: Impaired tissue integrity Goals: Patient/caregiver will verbalize understanding of skin care regimen Date Initiated: 03/06/2018 Target Resolution Date: 09/01/2020 Goal Status: Active Ulcer/skin breakdown will heal within 14 weeks Date Initiated: 07/31/2017 Date Inactivated: 01/09/2018 Target Resolution Date: 11/30/2017 Unmet Reason: noncompliant with Goal Status: Unmet offloading Interventions: Assess patient/caregiver ability to perform ulcer/skin care regimen upon admission and as  needed Notes: Electronic Signature(s) Signed: 08/04/2020 5:51:45 PM By: Baruch Gouty RN, BSN Entered By: Baruch Gouty on 08/04/2020 09:52:27 -------------------------------------------------------------------------------- Pain Assessment Details Patient Name: Date of Service: [name], [name] 08/04/2020 9:00 A M Medical Record Number: [id number] Patient Account Number: [id number] Date of Birth/Sex: Treating RN: 07/03/1966 (54 y.o. Janyth Contes Primary Care Jamal Pavon: [name] Other Clinician: Referring Ayren Zumbro: Treating Louay Myrie/Extender: Seymour Bars Weeks in Treatment: [phone] Active Problems Location of Pain Severity and Description of Pain Patient Has Paino Yes Site Locations Pain Location: Pain in Ulcers With Dressing Change: Yes Duration of the Pain. Constant / Intermittento Constant Rate the pain. Current Pain Level: 8 Character of Pain Describe the Pain: Throbbing Pain Management and Medication Current Pain Management: Medication: Yes Cold Application: No Rest: No Massage: No Activity: No T.E.N.S.: No Heat Application: No Leg drop or elevation: No Is the Current Pain Management Adequate: Adequate How does your wound impact your activities of daily livingo Sleep: No Bathing: No Appetite: No Relationship  With Others: No Bladder Continence: No Emotions: No Bowel Continence: No Work: No Toileting: No Drive: No Dressing: No Hobbies: No Electronic Signature(s) Signed: 08/05/2020 5:54:11 PM By: Levan Hurst RN, BSN Entered By: Levan Hurst on 08/04/2020 09:14:40 -------------------------------------------------------------------------------- Patient/Caregiver Education Details Patient Name: Date of Service: [name], [name] 11/3/2021andnbsp9:00 A M Medical Record Number: [id number] Patient Account Number: [id number] Date of Birth/Gender: Treating RN: 1966/03/17 (54 y.o. [name] Primary Care Physician: [name] Other Clinician: Referring Physician: Treating Physician/Extender: [name] in Treatment: [phone] Education Assessment Education Provided To: Patient Education Topics Provided Pressure: Methods: Explain/Verbal Responses: Reinforcements needed, State content correctly Wound/Skin Impairment: Methods: Explain/Verbal Responses: Reinforcements needed, State content correctly Electronic Signature(s) Signed: 08/04/2020 5:51:45 PM By: Baruch Gouty RN, BSN Entered By: Baruch Gouty on 08/04/2020 09:52:59 -------------------------------------------------------------------------------- Wound Assessment Details Patient Name: Date of Service: [name], [name] 08/04/2020 9:00 A M Medical Record Number: [id number] Patient Account Number: [id number] Date of Birth/Sex: Treating RN: 06-26-1966 (54 y.o. Janyth Contes Primary Care Jeramia Saleeby: Other Clinician: [name] Referring Albino Bufford: Treating Jahel Wavra/Extender: Seymour Bars Weeks in Treatment: 157 Wound Status Wound Number: 19 Primary Etiology: Pressure Ulcer Wound Location: Sacrum Wound Status: Open Wounding Event: Pressure Injury Comorbid History: Anemia, Hypertension, Colitis, Osteomyelitis, Paraplegia Date Acquired: 10/02/2013 Weeks  Of Treatment: 157 Clustered Wound: No Photos Photo Uploaded By: Mikeal Hawthorne on 08/06/2020 14:40:16 Wound Measurements Length: (cm) 1 Width: (cm) 1 Depth: (cm) 0.8 Area: (cm) 0.785 Volume: (cm) 0.628 % Reduction in Area: 63.2% % Reduction in Volume: 77.4% Epithelialization: Medium (34-66%) Tunneling: No Undermining: No Wound Description Classification: Category/Stage IV Wound Margin: Fibrotic scar, thickened scar Exudate Amount: Medium Exudate Type: Serosanguineous Exudate Color: red, brown Foul Odor After Cleansing: No Slough/Fibrino No Wound Bed Granulation Amount: Large (67-100%) Exposed Structure Granulation Quality: Pink, Pale Fascia Exposed: No Necrotic Amount: None Present (0%) Fat Layer (Subcutaneous Tissue) Exposed: Yes Tendon Exposed: No Muscle Exposed: No Joint Exposed: No Bone Exposed: No Treatment Notes Wound #19 (Sacrum) 1. Cleanse With Wound Cleanser 3. Primary Dressing Applied Calcium Alginate Ag 4. Secondary Dressing ABD Pad Dry Gauze 5. Secured With Recruitment consultant) Signed: 08/05/2020 5:54:11 PM By: Levan Hurst RN, BSN Entered By: Levan Hurst on 08/04/2020 09:37:01 -------------------------------------------------------------------------------- Wound Assessment Details Patient Name: Date of Service: [name], [name] 08/04/2020 9:00 A M Medical Record Number: [id number] Patient Account Number: [id number] Date of Birth/Sex: Treating RN: March 30, 1966 (54  y.o. Jerilynn Mages) Donnal Debar, [place] Primary Care Kamiryn Bezanson: [name] Other Clinician: Referring Kaitlan Bin: Treating Lerlene Treadwell/Extender: Seymour Bars Weeks in Treatment: 157 Wound Status Wound Number: 21 Primary Etiology: Pressure Ulcer Wound Location: Right, Medial Ischium Wound Status: Open Wounding Event: Gradually Appeared Comorbid History: Anemia, Hypertension, Colitis, Osteomyelitis, Paraplegia Date Acquired: 10/02/2012 Weeks Of Treatment:  122 Clustered Wound: No Photos Photo Uploaded By: Mikeal Hawthorne on 08/06/2020 14:39:51 Wound Measurements Length: (cm) 1.2 % Re Width: (cm) 1 % Re Depth: (cm) 1.8 Epit Area: (cm) 0.942 Tun Volume: (cm) 1.696 Und duction in Area: 66.7% duction in Volume: 70% helialization: Medium (34-66%) neling: No ermining: No Wound Description Classification: Category/Stage II Fou Wound Margin: Well defined, not attached Slo Exudate Amount: Small Exudate Type: Serosanguineous Exudate Color: red, brown l Odor After Cleansing: No ugh/Fibrino No Wound Bed Granulation Amount: Large (67-100%) Exposed Structure Granulation Quality: Pink, Pale Fascia Exposed: No Necrotic Amount: None Present (0%) Fat Layer (Subcutaneous Tissue) Exposed: Yes Tendon Exposed: No Muscle Exposed: No Joint Exposed: No Bone Exposed: No Treatment Notes Wound #21 (Right, Medial Ischium) 1. Cleanse With Wound Cleanser 3. Primary Dressing Applied Calcium Alginate Ag 4. Secondary Dressing ABD Pad Dry Gauze 5. Secured With Recruitment consultant) Signed: 08/05/2020 5:54:11 PM By: Levan Hurst RN, BSN Entered By: Levan Hurst on 08/04/2020 09:37:14 -------------------------------------------------------------------------------- Wound Assessment Details Patient Name: Date of Service: [name], [name] 08/04/2020 9:00 A M Medical Record Number: [id number] Patient Account Number: [id number] Date of Birth/Sex: Treating RN: Apr 04, 1966 (54 y.o. Janyth Contes Primary Care Nkosi Cortright: [name] Other Clinician: Referring Nicoya Friel: Treating Hanad Leino/Extender: Seymour Bars Weeks in Treatment: 157 Wound Status Wound Number: 22 Primary Etiology: Pressure Ulcer Wound Location: Left Ischium Wound Status: Open Wounding Event: Gradually Appeared Comorbid History: Anemia, Hypertension, Colitis, Osteomyelitis, Paraplegia Date Acquired: 10/02/2013 Weeks Of Treatment: 122 Clustered  Wound: No Photos Photo Uploaded By: Mikeal Hawthorne on 08/06/2020 14:40:17 Wound Measurements Length: (cm) 2.5 Width: (cm) 0.5 Depth: (cm) 3.6 Area: (cm) 0.982 Volume: (cm) 3.534 % Reduction in Area: -131.6% % Reduction in Volume: -197.5% Epithelialization: Small (1-33%) Tunneling: No Undermining: No Wound Description Classification: Category/Stage II Wound Margin: Epibole Exudate Amount: Medium Exudate Type: Serosanguineous Exudate Color: red, brown Foul Odor After Cleansing: No Slough/Fibrino No Wound Bed Granulation Amount: Large (67-100%) Exposed Structure Granulation Quality: Pink, Pale Fascia Exposed: No Necrotic Amount: None Present (0%) Fat Layer (Subcutaneous Tissue) Exposed: Yes Tendon Exposed: No Muscle Exposed: No Joint Exposed: No Bone Exposed: No Treatment Notes Wound #22 (Left Ischium) 1. Cleanse With Wound Cleanser 3. Primary Dressing Applied Calcium Alginate Ag 4. Secondary Dressing ABD Pad Dry Gauze 5. Secured With Recruitment consultant) Signed: 08/05/2020 5:54:11 PM By: Levan Hurst RN, BSN Entered By: Levan Hurst on 08/04/2020 09:37:28 -------------------------------------------------------------------------------- Wound Assessment Details Patient Name: Date of Service: [name], [name] 08/04/2020 9:00 A M Medical Record Number: [id number] Patient Account Number: [id number] Date of Birth/Sex: Treating RN: [birth year] (54 y.o. Janyth Contes Primary Care Blen Ransome: [name] Other Clinician: Referring Shirrell Solinger: Treating Niels Cranshaw/Extender: Seymour Bars Weeks in Treatment: 157 Wound Status Wound Number: 23 Primary Etiology: Pressure Ulcer Wound Location: Right Gluteus Wound Status: Open Wounding Event: Gradually Appeared Comorbid History: Anemia, Hypertension, Colitis, Osteomyelitis, Paraplegia Date Acquired: 09/18/2018 Weeks Of Treatment: 95 Clustered Wound: No Photos Photo Uploaded By:  Mikeal Hawthorne on 08/06/2020 14:39:52 Wound Measurements Length: (cm) 1.2 Width: (cm) 0.7 Depth: (cm) 3.4 Area: (cm) 0.66 Volume: (cm) 2.243 % Reduction in Area: -  90.8% % Reduction in Volume: -131.7% Epithelialization: Small (1-33%) Tunneling: No Undermining: No Wound Description Classification: Category/Stage II Wound Margin: Well defined, not attached Exudate Amount: Small Exudate Type: Serosanguineous Exudate Color: red, brown Foul Odor After Cleansing: No Slough/Fibrino No Wound Bed Granulation Amount: Large (67-100%) Exposed Structure Granulation Quality: Pink Fascia Exposed: No Necrotic Amount: None Present (0%) Fat Layer (Subcutaneous Tissue) Exposed: Yes Tendon Exposed: No Muscle Exposed: No Joint Exposed: No Bone Exposed: No Treatment Notes Wound #23 (Right Gluteus) 1. Cleanse With Wound Cleanser 3. Primary Dressing Applied Calcium Alginate Ag 4. Secondary Dressing ABD Pad Dry Gauze 5. Secured With Recruitment consultant) Signed: 08/05/2020 5:54:11 PM By: Levan Hurst RN, BSN Entered By: Levan Hurst on 08/04/2020 09:37:38 -------------------------------------------------------------------------------- [place] Details Patient Name: Date of Service: [name], [name] 08/04/2020 9:00 A M Medical Record Number: [id number] Patient Account Number: [id number] Date of Birth/Sex: Treating RN: Mar 24, 1966 (54 y.o. Janyth Contes Primary Care Macallister Ashmead: [name] Other Clinician: Referring Trejon Duford: Treating Ousmane Seeman/Extender: Seymour Bars Weeks in Treatment: 157 Vital Signs Time Taken: 09:16 Temperature (F): 98.7 Height (in): 72 Pulse (bpm): 82 Weight (lbs): 215 Respiratory Rate (breaths/min): 20 Body Mass Index (BMI): 29.2 Blood Pressure (mmHg): 158/89 Reference Range: 80 - 120 mg / dl Electronic Signature(s) Signed: 08/05/2020 5:54:11 PM By: Levan Hurst RN, BSN Entered By: Levan Hurst on 08/04/2020  09:16:34

## 2020-09-01 ENCOUNTER — Encounter (HOSPITAL_BASED_OUTPATIENT_CLINIC_OR_DEPARTMENT_OTHER): Payer: Medicare Other | Attending: Physician Assistant | Admitting: Physician Assistant

## 2020-09-15 ENCOUNTER — Encounter (HOSPITAL_BASED_OUTPATIENT_CLINIC_OR_DEPARTMENT_OTHER): Payer: Medicare Other | Attending: Physician Assistant | Admitting: Physician Assistant

## 2020-09-15 ENCOUNTER — Other Ambulatory Visit: Payer: Self-pay

## 2020-09-15 DIAGNOSIS — E669 Obesity, unspecified: Secondary | ICD-10-CM | POA: Diagnosis not present

## 2020-09-15 DIAGNOSIS — L89893 Pressure ulcer of other site, stage 3: Secondary | ICD-10-CM | POA: Diagnosis not present

## 2020-09-15 DIAGNOSIS — L89154 Pressure ulcer of sacral region, stage 4: Secondary | ICD-10-CM | POA: Insufficient documentation

## 2020-09-15 DIAGNOSIS — L89314 Pressure ulcer of right buttock, stage 4: Secondary | ICD-10-CM | POA: Insufficient documentation

## 2020-09-15 DIAGNOSIS — L89892 Pressure ulcer of other site, stage 2: Secondary | ICD-10-CM | POA: Diagnosis not present

## 2020-09-15 DIAGNOSIS — Z6829 Body mass index (BMI) 29.0-29.9, adult: Secondary | ICD-10-CM | POA: Diagnosis not present

## 2020-09-15 DIAGNOSIS — F17218 Nicotine dependence, cigarettes, with other nicotine-induced disorders: Secondary | ICD-10-CM | POA: Insufficient documentation

## 2020-09-15 DIAGNOSIS — G8222 Paraplegia, incomplete: Secondary | ICD-10-CM | POA: Diagnosis not present

## 2020-09-15 DIAGNOSIS — L89324 Pressure ulcer of left buttock, stage 4: Secondary | ICD-10-CM | POA: Insufficient documentation

## 2020-09-15 NOTE — Progress Notes (Addendum)
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 Ischium. The wound measures 2.5cm length x 0.5cm width x 5cm depth; 0.982cm^2 area and 4.909cm^3 volume. There is Fat Layer (Subcutaneous Tissue) exposed. There is no tunneling or undermining noted.  There is a medium amount of serosanguineous drainage noted. The wound margin is epibole. There is large (67-100%) pink, pale granulation within the wound bed. There is no necrotic tissue within the wound bed. Wound #23 status is Open. Original cause of wound was Gradually Appeared. The wound is located on the Right Gluteus. The wound measures 1.4cm length x 0.8cm width x 1.5cm depth; 0.88cm^2 area and 1.319cm^3 volume. There is Fat Layer (Subcutaneous Tissue) exposed. There is no tunneling or undermining noted. There is a small amount of serosanguineous drainage noted. The wound margin is well defined and not attached to the wound base. There is large (67- 100%) pink granulation within the wound bed. There is no necrotic tissue within the wound bed. Assessment Active Problems ICD-10 Pressure ulcer of left buttock, stage 4 Pressure ulcer of right buttock, stage 4 Pressure ulcer of sacral region, stage 4 Pressure ulcer of other site, stage 2  Pressure ulcer of other site, stage 3 Paraplegia, incomplete Nicotine dependence, cigarettes, with other nicotine-induced disorders Plan Follow-up Appointments: Return appointment in 1 month. Bathing/ Shower/ Hygiene: May shower and wash wound with soap and water. Off-Loading: Low air-loss mattress (Group 2) Turn and reposition every 2 hours WOUND #19: - Sacrum Wound Laterality: Prim Dressing: KerraCel Ag Gelling Fiber Dressing, 4x5 in (silver alginate) ary Discharge Instructions: Apply silver alginate to wound bed, do not pack, just tuck lightly into wounds as instructed Secondary Dressing: ABD Pad, 8x10 Discharge Instructions: Apply over primary dressing as directed. Secured With: 42M Medipore H Soft Cloth Surgical T 4 x 2 (in/yd) ape Discharge Instructions: Secure dressing with tape as directed. WOUND #21: - Ischium Wound Laterality: Right, Medial Prim Dressing: KerraCel Ag Gelling Fiber Dressing, 4x5 in (silver alginate) ary Discharge  Instructions: Apply silver alginate to wound bed, do not pack, just tuck lightly into wounds as instructed Secondary Dressing: ABD Pad, 8x10 Discharge Instructions: Apply over primary dressing as directed. Secured With: 42M Medipore H Soft Cloth Surgical T 4 x 2 (in/yd) ape Discharge Instructions: Secure dressing with tape as directed. WOUND #22: - Ischium Wound Laterality: Left Prim Dressing: KerraCel Ag Gelling Fiber Dressing, 4x5 in (silver alginate) ary Discharge Instructions: Apply silver alginate to wound bed, do not pack, just tuck lightly into wounds as instructed Secondary Dressing: ABD Pad, 8x10 Discharge Instructions: Apply over primary dressing as directed. Secured With: 42M Medipore H Soft Cloth Surgical T 4 x 2 (in/yd) ape Discharge Instructions: Secure dressing with tape as directed. WOUND #23: - Gluteus Wound Laterality: Right Prim Dressing: KerraCel Ag Gelling Fiber Dressing, 4x5 in (silver alginate) ary Discharge Instructions: Apply silver alginate to wound bed, do not pack, just tuck lightly into wounds as instructed Secondary Dressing: ABD Pad, 8x10 Discharge Instructions: Apply over primary dressing as directed. Secured With: 42M Medipore H Soft Cloth Surgical T 4 x 2 (in/yd) ape Discharge Instructions: Secure dressing with tape as directed. 1. I would recommend currently that we actually go ahead and continue with the wound care measures as before with regard to the silver alginate dressing obviously we want these not to be packed as the patient states that if they are packed they cause a lot of discomfort. It would be ideal to have been packed but again he does not tolerate this. 2. I would recommend as well he continue with the ABD pad to cover secured with tape. 3. I would recommend appropriate offloading we have ordered him a new bed apparently there was some issue with the order were trying to get that straightened out we had nothing sent back to [place] but apparently  they needed some additional information regarding the mattress. We will see patient back for reevaluation in 1 Month here in the clinic. If anything worsens or changes patient will contact our office for additional recommendations. Electronic Signature(s) Signed: 09/15/2020 10:22:22 AM By: [name] Keeler PA-C Entered By: [name] Keeler on 09/15/2020 10:22:22 -------------------------------------------------------------------------------- SuperBill Details Patient Name: Date of Service: [place] RA [place], [name] 09/15/2020 Medical Record Number: [id number] Patient Account Number: [id number] Date of Birth/Sex: Treating RN: 1966/04/04 (54 y.o. [name] Primary Care Provider: [name] Other Clinician: Referring Provider: Treating Provider/Extender: Seymour Bars Weeks in Treatment: 163 Diagnosis Coding ICD-10 Codes Code Description [phone] Pressure ulcer of left buttock, stage 4 L89.314 Pressure ulcer of right buttock, stage 4 L89.154 Pressure ulcer of sacral region, stage 4 L89.892 Pressure ulcer of other site, stage  2 L89.893 Pressure ulcer of other site, stage 3 G82.22 Paraplegia, incomplete F17.218 Nicotine dependence, cigarettes, with other nicotine-induced disorders Facility Procedures CPT4 Code: [id number] Description: [phone] - WOUND CARE VISIT-LEV 5 EST PT Modifier: Quantity: 1 Physician Procedures : CPT4 Code Description Modifier [id number] - WC PHYS LEVEL 3 - EST PT ICD-10 Diagnosis Description L89.324 Pressure ulcer of left buttock, stage 4 L89.314 Pressure ulcer of right buttock, stage 4 L89.154 Pressure ulcer of sacral region, stage 4  L89.892 Pressure ulcer of other site, stage 2 Quantity: 1 Electronic Signature(s) Signed: 09/15/2020 10:22:42 AM By: [name] Keeler PA-C Entered By: [name] Keeler on 09/15/2020 10:22:41

## 2020-09-17 NOTE — Progress Notes (Signed)
[name], [name] ([id number]) Visit Report for 09/15/2020 Arrival Information Details Patient Name: Date of Service: CA RA [name] [name] 09/15/2020 9:00 A M Medical Record Number: [id number] Patient Account Number: [id number] Date of Birth/Sex: Treating RN: March 22, 1966 (54 y.o. [name] Primary Care Daune Divirgilio: [name] Other Clinician: Referring Odeal Welden: Treating Parker Wherley/Extender: [name] in Treatment: 163 Visit Information History Since Last Visit Added or deleted any medications: No Patient Arrived: Wheel Chair Any new allergies or adverse reactions: No Arrival Time: 09:18 Had a fall or experienced change in No Accompanied By: alone activities of daily living that may affect Transfer Assistance: None risk of falls: Patient Identification Verified: Yes Signs or symptoms of abuse/neglect since last visito No Secondary Verification Process Completed: Yes Hospitalized since last visit: No Patient Requires Transmission-Based Precautions: No Implantable device outside of the clinic excluding No Patient Has Alerts: No cellular tissue based products placed in the center since last visit: Has Dressing in Place as Prescribed: Yes Pain Present Now: Yes Electronic Signature(s) Signed: 09/17/2020 5:46:53 PM By: Levan Hurst RN, BSN Entered By: Levan Hurst on 09/15/2020 09:27:10 -------------------------------------------------------------------------------- Clinic Level of Care Assessment Details Patient Name: Date of Service: CA RA [name], [name] 09/15/2020 9:00 [place] Record Number: [id number] Patient Account Number: [id number] Date of Birth/Sex: Treating RN: [birth year] (54 y.o. [name] Primary Care Derry Arbogast: [name] Other Clinician: Referring Roshan Roback: Treating Jaanvi Fizer/Extender: [name] in Treatment: 163 Clinic Level of Care Assessment Items TOOL 4 Quantity Score []  -  0 Use when only an EandM is performed on FOLLOW-UP visit ASSESSMENTS - Nursing Assessment / Reassessment X- 1 10 Reassessment of Co-morbidities (includes updates in patient status) X- 1 5 Reassessment of Adherence to Treatment Plan ASSESSMENTS - Wound and Skin A ssessment / Reassessment []  - 0 Simple Wound Assessment / Reassessment - one wound X- 4 5 Complex Wound Assessment / Reassessment - multiple wounds []  - 0 Dermatologic / Skin Assessment (not related to wound area) ASSESSMENTS - Focused Assessment []  - 0 Circumferential Edema Measurements - multi extremities []  - 0 Nutritional Assessment / Counseling / Intervention []  - 0 Lower Extremity Assessment (monofilament, tuning fork, pulses) []  - 0 Peripheral Arterial Disease Assessment (using hand held doppler) ASSESSMENTS - Ostomy and/or Continence Assessment and Care []  - 0 Incontinence Assessment and Management []  - 0 Ostomy Care Assessment and Management (repouching, etc.) PROCESS - Coordination of Care X - Simple Patient / Family Education for ongoing care 1 15 []  - 0 Complex (extensive) Patient / Family Education for ongoing care X- 1 10 Staff obtains Consents, Records, T Results / Process Orders est []  - 0 Staff telephones HHA, Nursing Homes / Clarify orders / etc []  - 0 Routine Transfer to another Facility (non-emergent condition) []  - 0 Routine Hospital Admission (non-emergent condition) []  - 0 New Admissions / Biomedical engineer / Ordering NPWT Apligraf, etc. , []  - 0 Emergency Hospital Admission (emergent condition) X- 1 10 Simple Discharge Coordination []  - 0 Complex (extensive) Discharge Coordination PROCESS - Special Needs []  - 0 Pediatric / Minor Patient Management []  - 0 Isolation Patient Management []  - 0 Hearing / Language / Visual special needs []  - 0 Assessment of Community assistance (transportation, D/C planning, etc.) []  - 0 Additional assistance / Altered mentation []  -  0 Support Surface(s) Assessment (bed, cushion, seat, etc.) INTERVENTIONS - Wound Cleansing / Measurement []  - 0 Simple Wound Cleansing - one wound X- 4 5 Complex Wound  Cleansing - multiple wounds X- 1 5 Wound Imaging (photographs - any number of wounds) []  - 0 Wound Tracing (instead of photographs) []  - 0 Simple Wound Measurement - one wound X- 4 5 Complex Wound Measurement - multiple wounds INTERVENTIONS - Wound Dressings X - Small Wound Dressing one or multiple wounds 4 10 []  - 0 Medium Wound Dressing one or multiple wounds []  - 0 Large Wound Dressing one or multiple wounds X- 1 5 Application of Medications - topical []  - 0 Application of Medications - injection INTERVENTIONS - Miscellaneous []  - 0 External ear exam []  - 0 Specimen Collection (cultures, biopsies, blood, body fluids, etc.) []  - 0 Specimen(s) / Culture(s) sent or taken to Lab for analysis []  - 0 Patient Transfer (multiple staff / Civil Service fast streamer / Similar devices) []  - 0 Simple Staple / Suture removal (25 or less) []  - 0 Complex Staple / Suture removal (26 or more) []  - 0 Hypo / Hyperglycemic Management (close monitor of Blood Glucose) []  - 0 Ankle / Brachial Index (ABI) - do not check if billed separately X- 1 5 Vital Signs Has the patient been seen at the hospital within the last three years: Yes Total Score: 165 Level Of Care: New/Established - Level 5 Electronic Signature(s) Signed: 09/15/2020 5:29:15 PM By: Baruch Gouty RN, BSN Entered By: Baruch Gouty on 09/15/2020 10:19:07 -------------------------------------------------------------------------------- Encounter Discharge Information Details Patient Name: Date of Service: [name], [name] 09/15/2020 9:00 A M Medical Record Number: [id number] Patient Account Number: [id number] Date of Birth/Sex: Treating RN: 1966/05/12 (54 y.o. [name] Primary Care Rudolfo Brandow: [name] Other Clinician: Referring Clarence Dunsmore: Treating  Jaylean Buenaventura/Extender: [name] in Treatment: [phone] Encounter Discharge Information Items Discharge Condition: Stable Ambulatory Status: Wheelchair Discharge Destination: Home Transportation: Private Auto Accompanied By: self Schedule Follow-up Appointment: Yes Clinical Summary of Care: Electronic Signature(s) Signed: 09/15/2020 5:09:50 PM By: Deon Pilling Entered By: Deon Pilling on 09/15/2020 11:38:33 -------------------------------------------------------------------------------- [place] Details Patient Name: Date of Service: CA RA [place], [name] 09/15/2020 9:00 A M Medical Record Number: [id number] Patient Account Number: [id number] Date of Birth/Sex: Treating RN: March 26, 1966 (54 y.o. [name], [name] Primary Care Rakisha Pincock: [name] Other Clinician: Referring Shenandoah Yeats: Treating Andrews Tener/Extender: [name] in Treatment: 163 Active Inactive Pressure Nursing Diagnoses: Knowledge deficit related to causes and risk factors for pressure ulcer development Knowledge deficit related to management of pressures ulcers Potential for impaired tissue integrity related to pressure, friction, moisture, and shear Goals: Patient will remain free from development of additional pressure ulcers Date Initiated: 12/12/2017 Date Inactivated: 08/14/2018 Target Resolution Date: 08/21/2018 Goal Status: Unmet Unmet Reason: hospitalized Patient will remain free of pressure ulcers Date Initiated: 12/12/2017 Date Inactivated: 02/06/2018 Target Resolution Date: 02/06/2018 Unmet Reason: pt noncompliant wth Goal Status: Unmet offloading Patient/caregiver will verbalize understanding of pressure ulcer management Date Initiated: 12/12/2017 Target Resolution Date: 09/01/2020 Goal Status: Active Interventions: Assess: immobility, friction, shearing, incontinence upon admission and as needed Assess offloading mechanisms upon  admission and as needed Assess potential for pressure ulcer upon admission and as needed Notes: Wound/Skin Impairment Nursing Diagnoses: Impaired tissue integrity Goals: Patient/caregiver will verbalize understanding of skin care regimen Date Initiated: 03/06/2018 Target Resolution Date: 09/01/2020 Goal Status: Active Ulcer/skin breakdown will heal within 14 weeks Date Initiated: 07/31/2017 Date Inactivated: 01/09/2018 Target Resolution Date: 11/30/2017 Unmet Reason: noncompliant with Goal Status: Unmet offloading Interventions: Assess patient/caregiver ability to perform ulcer/skin care regimen upon admission and as needed Notes: Electronic  Signature(s) Signed: 09/15/2020 5:29:15 PM By: Baruch Gouty RN, BSN Entered By: Baruch Gouty on 09/15/2020 09:22:57 -------------------------------------------------------------------------------- Pain Assessment Details Patient Name: Date of Service: [name], [name] 09/15/2020 9:00 A M Medical Record Number: [id number] Patient Account Number: [id number] Date of Birth/Sex: Treating RN: Oct 07, 1965 (54 y.o. [name] Primary Care [place]: [name] Other Clinician: Referring Tell Rozelle: Treating Lajuane Leatham/Extender: [name] in Treatment: 163 Active Problems Location of Pain Severity and Description of Pain Patient Has Paino Yes Site Locations Pain Location: Pain in Ulcers With Dressing Change: Yes Duration of the Pain. Constant / Intermittento Constant Rate the pain. Current Pain Level: 8 Character of Pain Describe the Pain: Throbbing Pain Management and Medication Current Pain Management: Medication: Yes Cold Application: No Rest: No Massage: No Activity: No T.E.N.S.: No Heat Application: No Leg drop or elevation: No Is the Current Pain Management Adequate: Adequate How does your wound impact your activities of daily livingo Sleep: No Bathing: No Appetite:  No Relationship With Others: No Bladder Continence: No Emotions: No Bowel Continence: No Work: No Toileting: No Drive: No Dressing: No Hobbies: No Electronic Signature(s) Signed: 09/17/2020 5:46:53 PM By: Levan Hurst RN, BSN Entered By: Levan Hurst on 09/15/2020 09:28:03 -------------------------------------------------------------------------------- Patient/Caregiver Education Details Patient Name: Date of Service: CA RA [name], [name] 12/15/2021andnbsp9:00 A M Medical Record Number: [id number] Patient Account Number: [id number] Date of Birth/Gender: Treating RN: [birth year] (54 y.o. [name] Primary Care Physician: [name] Other Clinician: Referring Physician: Treating Physician/Extender: [name] in Treatment: 62 Education Assessment Education Provided To: Patient Education Topics Provided Pressure: Methods: Explain/Verbal Responses: Reinforcements needed, State content correctly Wound/Skin Impairment: Methods: Explain/Verbal Responses: Reinforcements needed, State content correctly Electronic Signature(s) Signed: 09/15/2020 5:29:15 PM By: Baruch Gouty RN, BSN Entered By: Baruch Gouty on 09/15/2020 51:02:58 -------------------------------------------------------------------------------- Wound Assessment Details Patient Name: Date of Service: [name], [name] 09/15/2020 9:00 A M Medical Record Number: [id number] Patient Account Number: [id number] Date of Birth/Sex: Treating RN: 1965/10/28 (54 y.o. [name] Primary Care Quintavius Niebuhr: Other Clinician: [name] Referring Lamaya Hyneman: Treating Ahuva Poynor/Extender: Seymour Bars Weeks in Treatment: 163 Wound Status Wound Number: 19 Primary Etiology: Pressure Ulcer Wound Location: Sacrum Wound Status: Open Wounding Event: Pressure Injury Comorbid History: Anemia, Hypertension, Colitis, Osteomyelitis, Paraplegia Date  Acquired: 10/02/2013 Weeks Of Treatment: 163 Clustered Wound: No Photos Photo Uploaded By: Mikeal Hawthorne on 09/17/2020 14:27:52 Wound Measurements Length: (cm) 1 Width: (cm) 1.2 Depth: (cm) 1.1 Area: (cm) 0.942 Volume: (cm) 1.037 % Reduction in Area: 55.9% % Reduction in Volume: 62.7% Epithelialization: Medium (34-66%) Tunneling: No Undermining: Yes Starting Position (o'clock): 6 Ending Position (o'clock): 9 Maximum Distance: (cm) 1.4 Wound Description Classification: Category/Stage IV Wound Margin: Fibrotic scar, thickened scar Exudate Amount: Medium Exudate Type: Serosanguineous Exudate Color: red, brown Foul Odor After Cleansing: No Slough/Fibrino No Wound Bed Granulation Amount: Large (67-100%) Exposed Structure Granulation Quality: Pink, Pale Fascia Exposed: No Necrotic Amount: None Present (0%) Fat Layer (Subcutaneous Tissue) Exposed: Yes Tendon Exposed: No Muscle Exposed: No Joint Exposed: No Bone Exposed: No Treatment Notes Wound #19 (Sacrum) Cleanser Peri-Wound Care Topical Primary Dressing KerraCel Ag Gelling Fiber Dressing, 4x5 in (silver alginate) Discharge Instruction: Apply silver alginate to wound bed, do not pack, just tuck lightly into wounds as instructed Secondary Dressing ABD Pad, 8x10 Discharge Instruction: Apply over primary dressing as directed. Secured With 14M Medipore H Soft Cloth Surgical T 4 x 2 (in/yd) ape Discharge Instruction: Secure dressing with  tape as directed. Compression Wrap Compression Stockings Add-Ons Electronic Signature(s) Signed: 09/17/2020 5:46:53 PM By: Levan Hurst RN, BSN Entered By: Levan Hurst on 09/15/2020 09:43:44 -------------------------------------------------------------------------------- Wound Assessment Details Patient Name: Date of Service: [name], [name] 09/15/2020 9:00 A M Medical Record Number: [id number] Patient Account Number: [id number] Date of Birth/Sex: Treating RN: 09-28-1966  (54 y.o. [name] Primary Care Sheena Simonis: [name] Other Clinician: Referring Dianelys Scinto: Treating Anila Bojarski/Extender: Seymour Bars Weeks in Treatment: 163 Wound Status Wound Number: 21 Primary Etiology: Pressure Ulcer Wound Location: Right, Medial Ischium Wound Status: Open Wounding Event: Gradually Appeared Comorbid History: Anemia, Hypertension, Colitis, Osteomyelitis, Paraplegia Date Acquired: 10/02/2012 Weeks Of Treatment: 128 Clustered Wound: No Photos Photo Uploaded By: Mikeal Hawthorne on 09/17/2020 14:27:33 Wound Measurements Length: (cm) 1 Width: (cm) 2 Depth: (cm) 1.7 Area: (cm) 1.571 Volume: (cm) 2.67 % Reduction in Area: 44.4% % Reduction in Volume: 52.8% Epithelialization: Medium (34-66%) Tunneling: No Undermining: Yes Starting Position (o'clock): 12 Ending Position (o'clock): 3 Maximum Distance: (cm) 1.5 Wound Description Classification: Category/Stage II Wound Margin: Well defined, not attached Exudate Amount: Small Exudate Type: Serosanguineous Exudate Color: red, brown Foul Odor After Cleansing: No Slough/Fibrino No Wound Bed Granulation Amount: Large (67-100%) Exposed Structure Granulation Quality: Pink, Pale Fascia Exposed: No Necrotic Amount: None Present (0%) Fat Layer (Subcutaneous Tissue) Exposed: Yes Tendon Exposed: No Muscle Exposed: No Joint Exposed: No Bone Exposed: No Treatment Notes Wound #21 (Ischium) Wound Laterality: Right, Medial Cleanser Peri-Wound Care Topical Primary Dressing KerraCel Ag Gelling Fiber Dressing, 4x5 in (silver alginate) Discharge Instruction: Apply silver alginate to wound bed, do not pack, just tuck lightly into wounds as instructed Secondary Dressing ABD Pad, 8x10 Discharge Instruction: Apply over primary dressing as directed. Secured With 35M Medipore H Soft Cloth Surgical T 4 x 2 (in/yd) ape Discharge Instruction: Secure dressing with tape as directed. Compression  Wrap Compression Stockings Add-Ons Electronic Signature(s) Signed: 09/17/2020 5:46:53 PM By: Levan Hurst RN, BSN Entered By: Levan Hurst on 09/15/2020 09:44:52 -------------------------------------------------------------------------------- Wound Assessment Details Patient Name: Date of Service: [name], [name] 09/15/2020 9:00 A M Medical Record Number: [id number] Patient Account Number: [id number] Date of Birth/Sex: Treating RN: 28-Nov-1965 (54 y.o. [name] Primary Care Shyan Scalisi: [name] Other Clinician: Referring Chelsy Parrales: Treating Makaveli Hoard/Extender: Seymour Bars Weeks in Treatment: 163 Wound Status Wound Number: 22 Primary Etiology: Pressure Ulcer Wound Location: Left Ischium Wound Status: Open Wounding Event: Gradually Appeared Comorbid History: Anemia, Hypertension, Colitis, Osteomyelitis, Paraplegia Date Acquired: 10/02/2013 Weeks Of Treatment: 128 Clustered Wound: No Photos Photo Uploaded By: Mikeal Hawthorne on 09/17/2020 14:07:37 Wound Measurements Length: (cm) 2.5 Width: (cm) 0.5 Depth: (cm) 5 Area: (cm) 0.982 Volume: (cm) 4.909 % Reduction in Area: -131.6% % Reduction in Volume: -313.2% Epithelialization: Small (1-33%) Tunneling: No Undermining: No Wound Description Classification: Category/Stage II Wound Margin: Epibole Exudate Amount: Medium Exudate Type: Serosanguineous Exudate Color: red, brown Foul Odor After Cleansing: No Slough/Fibrino No Wound Bed Granulation Amount: Large (67-100%) Exposed Structure Granulation Quality: Pink, Pale Fascia Exposed: No Necrotic Amount: None Present (0%) Fat Layer (Subcutaneous Tissue) Exposed: Yes Tendon Exposed: No Muscle Exposed: No Joint Exposed: No Bone Exposed: No Treatment Notes Wound #22 (Ischium) Wound Laterality: Left Cleanser Peri-Wound Care Topical Primary Dressing KerraCel Ag Gelling Fiber Dressing, 4x5 in (silver alginate) Discharge  Instruction: Apply silver alginate to wound bed, do not pack, just tuck lightly into wounds as instructed Secondary Dressing ABD Pad, 8x10 Discharge Instruction: Apply over primary dressing as directed. Secured  With 20M Medipore H Soft Cloth Surgical T 4 x 2 (in/yd) ape Discharge Instruction: Secure dressing with tape as directed. Compression Wrap Compression Stockings Add-Ons Electronic Signature(s) Signed: 09/17/2020 5:46:53 PM By: Levan Hurst RN, BSN Entered By: Levan Hurst on 09/15/2020 09:43:15 -------------------------------------------------------------------------------- Wound Assessment Details Patient Name: Date of Service: [name], [name] 09/15/2020 9:00 A M Medical Record Number: [id number] Patient Account Number: [id number] Date of Birth/Sex: Treating RN: 1965-12-16 (54 y.o. [name] Primary Care Izela Altier: [name] Other Clinician: Referring Matison Nuccio: Treating Derian Pfost/Extender: Seymour Bars Weeks in Treatment: 163 Wound Status Wound Number: 23 Primary Etiology: Pressure Ulcer Wound Location: Right Gluteus Wound Status: Open Wounding Event: Gradually Appeared Comorbid History: Anemia, Hypertension, Colitis, Osteomyelitis, Paraplegia Date Acquired: 09/18/2018 Weeks Of Treatment: 101 Clustered Wound: No Photos Photo Uploaded By: Mikeal Hawthorne on 09/17/2020 14:27:34 Wound Measurements Length: (cm) 1.4 Width: (cm) 0.8 Depth: (cm) 1.5 Area: (cm) 0.88 Volume: (cm) 1.319 % Reduction in Area: -154.3% % Reduction in Volume: -36.3% Epithelialization: Small (1-33%) Tunneling: No Undermining: No Wound Description Classification: Category/Stage II Wound Margin: Well defined, not attached Exudate Amount: Small Exudate Type: Serosanguineous Exudate Color: red, brown Foul Odor After Cleansing: No Slough/Fibrino No Wound Bed Granulation Amount: Large (67-100%) Exposed Structure Granulation Quality: Pink Fascia  Exposed: No Necrotic Amount: None Present (0%) Fat Layer (Subcutaneous Tissue) Exposed: Yes Tendon Exposed: No Muscle Exposed: No Joint Exposed: No Bone Exposed: No Treatment Notes Wound #23 (Gluteus) Wound Laterality: Right Cleanser Peri-Wound Care Topical Primary Dressing KerraCel Ag Gelling Fiber Dressing, 4x5 in (silver alginate) Discharge Instruction: Apply silver alginate to wound bed, do not pack, just tuck lightly into wounds as instructed Secondary Dressing ABD Pad, 8x10 Discharge Instruction: Apply over primary dressing as directed. Secured With 20M Medipore H Soft Cloth Surgical T 4 x 2 (in/yd) ape Discharge Instruction: Secure dressing with tape as directed. Compression Wrap Compression Stockings Add-Ons Electronic Signature(s) Signed: 09/17/2020 5:46:53 PM By: Levan Hurst RN, BSN Entered By: Levan Hurst on 09/15/2020 09:44:08 -------------------------------------------------------------------------------- [place] Details Patient Name: Date of Service: [name], [name] 09/15/2020 9:00 A M Medical Record Number: [id number] Patient Account Number: [id number] Date of Birth/Sex: Treating RN: Apr 28, 1966 (54 y.o. [name] Primary Care Justin Meisenheimer: [name] Other Clinician: Referring Javen Hinderliter: Treating Anikah Hogge/Extender: Seymour Bars Weeks in Treatment: 163 Vital Signs Time Taken: 09:27 Temperature (F): 98.3 Height (in): 72 Pulse (bpm): 80 Weight (lbs): 215 Respiratory Rate (breaths/min): 18 Body Mass Index (BMI): 29.2 Blood Pressure (mmHg): 114/76 Reference Range: 80 - 120 mg / dl Electronic Signature(s) Signed: 09/17/2020 5:46:53 PM By: Levan Hurst RN, BSN Entered By: Levan Hurst on 09/15/2020 09:27:44

## 2020-10-05 ENCOUNTER — Other Ambulatory Visit: Payer: Self-pay

## 2020-10-05 ENCOUNTER — Ambulatory Visit: Payer: Medicare Other | Attending: Internal Medicine

## 2020-10-05 DIAGNOSIS — Z23 Encounter for immunization: Secondary | ICD-10-CM

## 2020-10-05 NOTE — Progress Notes (Signed)
   Covid-19 Vaccination Clinic  Name:  [name]    MRN: [id number] DOB: 1966-03-16  10/05/2020  [name] was observed post Covid-19 immunization for 15 minutes without incident. He was provided with Vaccine Information Sheet and instruction to access the V-Safe system.   [name] was instructed to call 911 with any severe reactions post vaccine: [name] Difficulty breathing  . Swelling of face and throat  . A fast heartbeat  . A bad rash all over body  . Dizziness and weakness   Immunizations Administered    Name Date Dose VIS Date Route   Pfizer COVID-19 Vaccine 10/05/2020  3:17 PM 0.3 mL 07/21/2020 Intramuscular   Manufacturer: [employer], [employer]   Lot: [id number]   NDC: [address]-6

## 2020-10-27 ENCOUNTER — Encounter (HOSPITAL_BASED_OUTPATIENT_CLINIC_OR_DEPARTMENT_OTHER): Payer: Medicare Other | Admitting: Physician Assistant

## 2020-11-03 ENCOUNTER — Other Ambulatory Visit: Payer: Self-pay

## 2020-11-03 ENCOUNTER — Encounter (HOSPITAL_BASED_OUTPATIENT_CLINIC_OR_DEPARTMENT_OTHER): Payer: Medicare Other | Attending: Physician Assistant | Admitting: Physician Assistant

## 2020-11-03 DIAGNOSIS — G894 Chronic pain syndrome: Secondary | ICD-10-CM | POA: Insufficient documentation

## 2020-11-03 DIAGNOSIS — L89893 Pressure ulcer of other site, stage 3: Secondary | ICD-10-CM | POA: Insufficient documentation

## 2020-11-03 DIAGNOSIS — L89314 Pressure ulcer of right buttock, stage 4: Secondary | ICD-10-CM | POA: Insufficient documentation

## 2020-11-03 DIAGNOSIS — E46 Unspecified protein-calorie malnutrition: Secondary | ICD-10-CM | POA: Insufficient documentation

## 2020-11-03 DIAGNOSIS — L89154 Pressure ulcer of sacral region, stage 4: Secondary | ICD-10-CM | POA: Diagnosis not present

## 2020-11-03 DIAGNOSIS — L89892 Pressure ulcer of other site, stage 2: Secondary | ICD-10-CM | POA: Diagnosis not present

## 2020-11-03 DIAGNOSIS — F17218 Nicotine dependence, cigarettes, with other nicotine-induced disorders: Secondary | ICD-10-CM | POA: Insufficient documentation

## 2020-11-03 DIAGNOSIS — L89324 Pressure ulcer of left buttock, stage 4: Secondary | ICD-10-CM | POA: Insufficient documentation

## 2020-11-03 DIAGNOSIS — G8222 Paraplegia, incomplete: Secondary | ICD-10-CM | POA: Insufficient documentation

## 2020-11-03 DIAGNOSIS — Z933 Colostomy status: Secondary | ICD-10-CM | POA: Diagnosis not present

## 2020-11-03 NOTE — Progress Notes (Addendum)
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 that standpoint as he is not moving as much as he was in the past. All of this is leading to him having some pretty significant issues at this point all of which I think are completely real but all of which I think may be at least propagated in part by his weight gain. He has not talked to his primary care provider yet about  any of this. Objective Constitutional Obese and well-hydrated in no acute distress. Vitals Time Taken: 8:48 AM, Height: 72 in, Weight: 215 lbs, BMI: 29.2, Temperature: 98.4 F, Pulse: 90 bpm, Respiratory Rate: 20 breaths/min, Blood Pressure: 175/75 mmHg. Respiratory normal  breathing without difficulty. Psychiatric this patient is able to make decisions and demonstrates good insight into disease process. Alert and Oriented x 3. pleasant and cooperative. General Notes: Upon evaluation today patient's wounds appear to be doing fairly well. Overall I do not see any signs of infection though there really has not been significant improvement from the standpoint of healing as best I can see although I was not able to see all wounds due to the fact of his positioning he insisted on laying on his right side which made it very difficult to see the majority of his wounds. Integumentary (Hair, Skin) Wound #19 status is Open. Original cause of wound was Pressure Injury. The wound is located on the Sacrum. The wound measures 1.5cm length x 1cm width x 0.5cm depth; 1.178cm^2 area and 0.589cm^3 volume. There is Fat Layer (Subcutaneous Tissue) exposed. There is no tunneling or undermining noted. There is a medium amount of serosanguineous drainage noted. The wound margin is fibrotic, thickened scar. There is large (67-100%) pink, pale granulation within the wound bed. There is no necrotic tissue within the wound bed. Wound #21 status is Open. Original cause of wound was Gradually Appeared. The wound is located on the Right,Medial Ischium. The wound measures 1cm length x 2cm width x 1.7cm depth; 1.571cm^2 area and 2.67cm^3 volume. There is Fat Layer (Subcutaneous Tissue) exposed. There is no tunneling or undermining noted. There is a small amount of serosanguineous drainage noted. The wound margin is well defined and not attached to the wound base. There is large (67-100%) pink, pale granulation within the wound  bed. There is no necrotic tissue within the wound bed. Wound #22 status is Open. Original cause of wound was Gradually Appeared. The wound is located on the Left Ischium. The wound measures 2cm length x 0.5cm width x 3.4cm depth; 0.785cm^2 area and 2.67cm^3 volume. There is Fat Layer (Subcutaneous Tissue) exposed. There is no tunneling or undermining noted. There is a medium amount of serosanguineous drainage noted. The wound margin is epibole. There is large (67-100%) pink, pale granulation within the wound bed. There is no necrotic tissue within the wound bed. Wound #23 status is Open. Original cause of wound was Gradually Appeared. The wound is located on the Right Gluteus. The wound measures 1.4cm length x 0.8cm width x 1.5cm depth; 0.88cm^2 area and 1.319cm^3 volume. There is Fat Layer (Subcutaneous Tissue) exposed. There is no tunneling or undermining noted. There is a medium amount of serosanguineous drainage noted. The wound margin is well defined and not attached to the wound base. There is large (67- 100%) pink granulation within the wound bed. There is no necrotic tissue within the wound bed. Assessment Active Problems ICD-10 Pressure ulcer of left buttock, stage 4 Pressure ulcer of right buttock, stage 4 Pressure ulcer of sacral region, stage 4 Pressure ulcer of other site, stage 2 Pressure ulcer of other site, stage 3 Paraplegia, incomplete Nicotine dependence, cigarettes, with other nicotine-induced disorders Plan Follow-up Appointments: Return Appointment in: - 2 months Bathing/ Shower/ Hygiene: May shower and wash wound with soap and water. Off-Loading: Low air-loss mattress (Group 2) Turn and reposition every 2 hours WOUND #19: - Sacrum Wound Laterality: Prim Dressing: KerraCel Ag Gelling Fiber Dressing, 4x5 in (silver alginate) ary Discharge Instructions: Apply silver alginate to wound bed, do not pack, just tuck lightly into wounds as instructed Secondary Dressing:  ABD Pad, 8x10 Discharge Instructions: Apply over primary dressing as directed. Secured With: 39M Medipore H Public affairs consultant Surgical T 4  x 2 (in/yd) ape Discharge Instructions: Secure dressing with tape as directed. WOUND #21: - Ischium Wound Laterality: Right, Medial Prim Dressing: KerraCel Ag Gelling Fiber Dressing, 4x5 in (silver alginate) ary Discharge Instructions: Apply silver alginate to wound bed, do not pack, just tuck lightly into wounds as instructed Secondary Dressing: ABD Pad, 8x10 Discharge Instructions: Apply over primary dressing as directed. Secured With: 32M Medipore H Soft Cloth Surgical T 4 x 2 (in/yd) ape Discharge Instructions: Secure dressing with tape as directed. WOUND #22: - Ischium Wound Laterality: Left Prim Dressing: KerraCel Ag Gelling Fiber Dressing, 4x5 in (silver alginate) ary Discharge Instructions: Apply silver alginate to wound bed, do not pack, just tuck lightly into wounds as instructed Secondary Dressing: ABD Pad, 8x10 Discharge Instructions: Apply over primary dressing as directed. Secured With: 32M Medipore H Soft Cloth Surgical T 4 x 2 (in/yd) ape Discharge Instructions: Secure dressing with tape as directed. WOUND #23: - Gluteus Wound Laterality: Right Prim Dressing: KerraCel Ag Gelling Fiber Dressing, 4x5 in (silver alginate) ary Discharge Instructions: Apply silver alginate to wound bed, do not pack, just tuck lightly into wounds as instructed Secondary Dressing: ABD Pad, 8x10 Discharge Instructions: Apply over primary dressing as directed. Secured With: 32M Medipore H Soft Cloth Surgical T 4 x 2 (in/yd) ape Discharge Instructions: Secure dressing with tape as directed. 1. Would recommend currently that we go ahead and continue with the wound care measures as before and the patient is in agreement the plan this includes the use of the silver alginate dressing he is just laying this on top which really is not accomplishing the main goal that we want  with packing but he is not able to tolerate the packing. 2. I am also can recommend the patient needs to reposition in the bed. He tells me that he really cannot as it is just too hard for him. I suggested he needs to contact his primary care provider to see if they can get him potentially a grab bar to be able to help with repositioning such as a trapeze or something of that nature ordered. Also think that he likely is going require Harrel Lemon as far as movement is concerned at home going forward. All of this was discussed with the patient today. We will see patient back for reevaluation in 2 months here in the clinic. If anything worsens or changes patient will contact our office for additional recommendations. Addendum: The patient does have a bariatric bed with air mattress. I do feel like considering especially the fact that he is more bedbound now than ever this is of utmost importance to try to help keep the pressure off of the wounds and prevent them from deteriorating further. The patient does seem to be benefiting from this in my opinion and as such I would recommend that we continue with the use of the bariatric bed with air mattress on this in combination as the gold standard for treatment at this point. Obviously this doesn't negate the fact that he needs to reposition in order to prevent any areas from receiving too much pressure but nonetheless this definitely benefits him in my opinion with his ongoing treatment. It is very difficult for this patient to reposition to be honest especially at this point where he is very weak and in general having a hard time getting even from his chair back into the bed. Electronic Signature(s) Signed: 11/12/2020 4:22:37 PM By: [name] Keeler PA-C Previous Signature: 11/03/2020 9:24:13 AM Version By: Melburn Hake,  Margarita Grizzle PA-C Entered By: [name] [name] on 11/12/2020  16:22:37 -------------------------------------------------------------------------------- SuperBill Details Patient Name: Date of Service: [place] RA [name] 11/03/2020 Medical Record Number: [id number] Patient Account Number: [id number] Date of Birth/Sex: Treating RN: [id number] (55 y.o. [name] Primary Care Provider: York [name] Other Clinician: Referring Provider: Treating Provider/Extender: [name] in Treatment: 170 Diagnosis Coding ICD-10 Codes Code Description [phone] Pressure ulcer of left buttock, stage 4 L89.314 Pressure ulcer of right buttock, stage 4 L89.154 Pressure ulcer of sacral region, stage 4 L89.892 Pressure ulcer of other site, stage 2 L89.893 Pressure ulcer of other site, stage 3 G82.22 Paraplegia, incomplete F17.218 Nicotine dependence, cigarettes, with other nicotine-induced disorders Facility Procedures CPT4 Code: [id number] Description: 9215 - WOUND CARE VISIT-LEV 5 EST PT Modifier: Quantity: 1 Physician Procedures : CPT4 Code Description Modifier [id number] - WC PHYS LEVEL 3 - EST PT ICD-10 Diagnosis Description L89.324 Pressure ulcer of left buttock, stage 4 L89.314 Pressure ulcer of right buttock, stage 4 L89.154 Pressure ulcer of sacral region, stage 4  L89.892 Pressure ulcer of other site, stage 2 Quantity: 1 Electronic Signature(s) Signed: 11/03/2020 9:24:28 AM By: [name] Keeler PA-C Entered By: [name] [name] on 11/03/2020 09:24:27

## 2020-11-08 NOTE — Progress Notes (Signed)
AYVEN, PHEASANT ([id number]) Visit Report for 11/03/2020 Arrival Information Details Patient Name: Date of Service: [name] [name] 11/03/2020 8:45 A M Medical Record Number: [id number] Patient Account Number: [id number] Date of Birth/Sex: Treating RN: 1965-11-02 (55 y.o. Janyth Contes Primary Care Vicy Medico: [name] Other Clinician: Referring Shayana Hornstein: Treating Sherie Dobrowolski/Extender: [name] in Treatment: 170 Visit Information History Since Last Visit Added or deleted any medications: No Patient Arrived: Wheel Chair Any new allergies or adverse reactions: No Arrival Time: 08:47 Had a fall or experienced change in No Accompanied By: alone activities of daily living that may affect Transfer Assistance: Harrel Lemon Lift risk of falls: Patient Identification Verified: Yes Signs or symptoms of abuse/neglect since last visito No Secondary Verification Process Completed: Yes Hospitalized since last visit: No Patient Requires Transmission-Based Precautions: No Implantable device outside of the clinic excluding No Patient Has Alerts: No cellular tissue based products placed in the center since last visit: Has Dressing in Place as Prescribed: Yes Pain Present Now: Yes Electronic Signature(s) Signed: 11/03/2020 5:46:35 PM By: Levan Hurst RN, BSN Entered By: Levan Hurst on 11/03/2020 08:47:40 -------------------------------------------------------------------------------- Clinic Level of Care Assessment Details Patient Name: Date of Service: CA RA [place], [place] 11/03/2020 8:45 A M Medical Record Number: [id number] Patient Account Number: [id number] Date of Birth/Sex: Treating RN: [birth year] (55 y.o. [name] Primary Care Rainna Nearhood: [name] Other Clinician: Referring Kathrynn Backstrom: Treating Charmel Pronovost/Extender: [name] in Treatment: 170 Clinic Level of Care Assessment Items TOOL 4 Quantity Score []  -  0 Use when only an EandM is performed on FOLLOW-UP visit ASSESSMENTS - Nursing Assessment / Reassessment X- 1 10 Reassessment of Co-morbidities (includes updates in patient status) X- 1 5 Reassessment of Adherence to Treatment Plan ASSESSMENTS - Wound and Skin A ssessment / Reassessment []  - 0 Simple Wound Assessment / Reassessment - one wound X- 4 5 Complex Wound Assessment / Reassessment - multiple wounds []  - 0 Dermatologic / Skin Assessment (not related to wound area) ASSESSMENTS - Focused Assessment []  - 0 Circumferential Edema Measurements - multi extremities []  - 0 Nutritional Assessment / Counseling / Intervention []  - 0 Lower Extremity Assessment (monofilament, tuning fork, pulses) []  - 0 Peripheral Arterial Disease Assessment (using hand held doppler) ASSESSMENTS - Ostomy and/or Continence Assessment and Care []  - 0 Incontinence Assessment and Management []  - 0 Ostomy Care Assessment and Management (repouching, etc.) PROCESS - Coordination of Care X - Simple Patient / Family Education for ongoing care 1 15 []  - 0 Complex (extensive) Patient / Family Education for ongoing care X- 1 10 Staff obtains Programmer, systems, Records, T Results / Process Orders est []  - 0 Staff telephones HHA, Nursing Homes / Clarify orders / etc []  - 0 Routine Transfer to another Facility (non-emergent condition) []  - 0 Routine Hospital Admission (non-emergent condition) []  - 0 New Admissions / Biomedical engineer / Ordering NPWT Apligraf, etc. , []  - 0 Emergency Hospital Admission (emergent condition) X- 1 10 Simple Discharge Coordination []  - 0 Complex (extensive) Discharge Coordination PROCESS - Special Needs []  - 0 Pediatric / Minor Patient Management []  - 0 Isolation Patient Management []  - 0 Hearing / Language / Visual special needs []  - 0 Assessment of Community assistance (transportation, D/C planning, etc.) []  - 0 Additional assistance / Altered mentation []  -  0 Support Surface(s) Assessment (bed, cushion, seat, etc.) INTERVENTIONS - Wound Cleansing / Measurement []  - 0 Simple Wound Cleansing - one wound X- 4 5 Complex  Wound Cleansing - multiple wounds X- 1 5 Wound Imaging (photographs - any number of wounds) []  - 0 Wound Tracing (instead of photographs) []  - 0 Simple Wound Measurement - one wound X- 4 5 Complex Wound Measurement - multiple wounds INTERVENTIONS - Wound Dressings X - Small Wound Dressing one or multiple wounds 4 10 []  - 0 Medium Wound Dressing one or multiple wounds []  - 0 Large Wound Dressing one or multiple wounds X- 1 5 Application of Medications - topical []  - 0 Application of Medications - injection INTERVENTIONS - Miscellaneous []  - 0 External ear exam []  - 0 Specimen Collection (cultures, biopsies, blood, body fluids, etc.) []  - 0 Specimen(s) / Culture(s) sent or taken to Lab for analysis X- 1 10 Patient Transfer (multiple staff / Civil Service fast streamer / Similar devices) []  - 0 Simple Staple / Suture removal (25 or less) []  - 0 Complex Staple / Suture removal (26 or more) []  - 0 Hypo / Hyperglycemic Management (close monitor of Blood Glucose) []  - 0 Ankle / Brachial Index (ABI) - do not check if billed separately X- 1 5 Vital Signs Has the patient been seen at the hospital within the last three years: Yes Total Score: 175 Level Of Care: New/Established - Level 5 Electronic Signature(s) Signed: 11/03/2020 5:50:42 PM By: Baruch Gouty RN, BSN Entered By: Baruch Gouty on 11/03/2020 09:07:36 -------------------------------------------------------------------------------- Encounter Discharge Information Details Patient Name: Date of Service: [place] RA [place], [name] 11/03/2020 8:45 A M Medical Record Number: [id number] Patient Account Number: [id number] Date of Birth/Sex: Treating RN: 1965-11-25 (55 y.o. Erie Noe Primary Care Alexiya Franqui: [name] Other Clinician: Referring Tristian Sickinger: Treating  Jaquasha Carnevale/Extender: [name] in Treatment: [phone] Encounter Discharge Information Items Discharge Condition: Stable Ambulatory Status: Wheelchair Discharge Destination: Home Transportation: Private Auto Accompanied By: self Schedule Follow-up Appointment: Yes Clinical Summary of Care: Patient Declined Electronic Signature(s) Signed: 11/08/2020 9:09:54 AM By: Rhae Hammock RN Entered By: Rhae Hammock on 11/03/2020 09:29:33 -------------------------------------------------------------------------------- Lower Extremity Assessment Details Patient Name: Date of Service: CA RA [place], [place] 11/03/2020 8:45 A M Medical Record Number: [id number] Patient Account Number: [id number] Date of Birth/Sex: Treating RN: 1966/04/27 (55 y.o. Janyth Contes Primary Care Donisha Hoch: [name] Other Clinician: Referring Loletta Harper: Treating Nissi Doffing/Extender: Seymour Bars Weeks in Treatment: 170 Electronic Signature(s) Signed: 11/03/2020 5:46:35 PM By: Levan Hurst RN, BSN Entered By: Levan Hurst on 11/03/2020 08:57:14 -------------------------------------------------------------------------------- Multi-Disciplinary Care Plan Details Patient Name: Date of Service: CA RA [place], [name] 11/03/2020 8:45 A M Medical Record Number: [id number] Patient Account Number: [id number] Date of Birth/Sex: Treating RN: [birth year] (55 y.o. Ulyses Amor, Vaughan Basta Primary Care Filomeno Cromley: [name] Other Clinician: Referring Tracer Gutridge: Treating Tenleigh Byer/Extender: Seymour Bars Weeks in Treatment: 170 Active Inactive Pressure Nursing Diagnoses: Knowledge deficit related to causes and risk factors for pressure ulcer development Knowledge deficit related to management of pressures ulcers Potential for impaired tissue integrity related to pressure, friction, moisture, and shear Goals: Patient will remain free from development of  additional pressure ulcers Date Initiated: 12/12/2017 Date Inactivated: 08/14/2018 Target Resolution Date: 08/21/2018 Goal Status: Unmet Unmet Reason: hospitalized Patient will remain free of pressure ulcers Date Initiated: 12/12/2017 Date Inactivated: 02/06/2018 Target Resolution Date: 02/06/2018 Unmet Reason: pt noncompliant wth Goal Status: Unmet offloading Patient/caregiver will verbalize understanding of pressure ulcer management Date Initiated: 12/12/2017 Target Resolution Date: 12/01/2020 Goal Status: Active Interventions: Assess: immobility, friction, shearing, incontinence upon admission and as needed Assess offloading mechanisms upon admission and  as needed Assess potential for pressure ulcer upon admission and as needed Notes: Wound/Skin Impairment Nursing Diagnoses: Impaired tissue integrity Goals: Patient/caregiver will verbalize understanding of skin care regimen Date Initiated: 03/06/2018 Target Resolution Date: 12/01/2020 Goal Status: Active Ulcer/skin breakdown will heal within 14 weeks Date Initiated: 07/31/2017 Date Inactivated: 01/09/2018 Target Resolution Date: 11/30/2017 Unmet Reason: noncompliant with Goal Status: Unmet offloading Interventions: Assess patient/caregiver ability to perform ulcer/skin care regimen upon admission and as needed Notes: Electronic Signature(s) Signed: 11/03/2020 5:50:42 PM By: Baruch Gouty RN, BSN Entered By: Baruch Gouty on 11/03/2020 09:05:01 -------------------------------------------------------------------------------- Pain Assessment Details Patient Name: Date of Service: CA RA [place], [name] 11/03/2020 8:45 A M Medical Record Number: [id number] Patient Account Number: [id number] Date of Birth/Sex: Treating RN: 11-15-1965 (55 y.o. Janyth Contes Primary Care Legend Tumminello: [name] Other Clinician: Referring Kalani Baray: Treating Miara Emminger/Extender: Seymour Bars Weeks in Treatment: [id number] Active  Problems Location of Pain Severity and Description of Pain Patient Has Paino Yes Site Locations Pain Location: Generalized Pain With Dressing Change: Yes Duration of the Pain. Constant / Intermittento Constant Rate the pain. Current Pain Level: 10 Character of Pain Describe the Pain: Throbbing Pain Management and Medication Current Pain Management: Medication: Yes Cold Application: No Rest: No Massage: No Activity: No T.E.N.S.: No Heat Application: No Leg drop or elevation: No Is the Current Pain Management Adequate: Adequate How does your wound impact your activities of daily livingo Sleep: No Bathing: No Appetite: No Relationship With Others: No Bladder Continence: No Emotions: No Bowel Continence: No Work: No Toileting: No Drive: No Dressing: No Hobbies: No Engineer, maintenance) Signed: 11/03/2020 5:46:35 PM By: Levan Hurst RN, BSN Entered By: Levan Hurst on 11/03/2020 08:48:02 -------------------------------------------------------------------------------- Patient/Caregiver Education Details Patient Name: Date of Service: [name], [name] 2/2/2022andnbsp8:45 A M Medical Record Number: [id number] Patient Account Number: [id number] Date of Birth/Gender: Treating RN: [birth year] (55 y.o. [name] Primary Care Physician: [name] Other Clinician: Referring Physician: Treating Physician/Extender: [name] in Treatment: [phone] Education Assessment Education Provided To: Patient Education Topics Provided Pressure: Methods: Explain/Verbal Responses: Reinforcements needed, State content correctly Wound/Skin Impairment: Methods: Explain/Verbal Responses: Reinforcements needed, State content correctly Electronic Signature(s) Signed: 11/03/2020 5:50:42 PM By: Baruch Gouty RN, BSN Entered By: Baruch Gouty on 11/03/2020  09:05:33 -------------------------------------------------------------------------------- Wound Assessment Details Patient Name: Date of Service: CA RA [place], [name] 11/03/2020 8:45 A M Medical Record Number: [id number] Patient Account Number: [id number] Date of Birth/Sex: Treating RN: 1966-06-02 (55 y.o. Janyth Contes Primary Care Jode Lippe: [name] Other Clinician: Referring Gigi Onstad: Treating Zelpha Messing/Extender: Seymour Bars Weeks in Treatment: 170 Wound Status Wound Number: 19 Primary Etiology: Pressure Ulcer Wound Location: Sacrum Wound Status: Open Wounding Event: Pressure Injury Comorbid History: Anemia, Hypertension, Colitis, Osteomyelitis, Paraplegia Date Acquired: 10/02/2013 Weeks Of Treatment: 170 Clustered Wound: No Wound Measurements Length: (cm) 1.5 Width: (cm) 1 Depth: (cm) 0.5 Area: (cm) 1.178 Volume: (cm) 0.589 % Reduction in Area: 44.9% % Reduction in Volume: 78.8% Epithelialization: Medium (34-66%) Tunneling: No Undermining: No Wound Description Classification: Category/Stage IV Wound Margin: Fibrotic scar, thickened scar Exudate Amount: Medium Exudate Type: Serosanguineous Exudate Color: red, brown Foul Odor After Cleansing: No Slough/Fibrino No Wound Bed Granulation Amount: Large (67-100%) Exposed Structure Granulation Quality: Pink, Pale Fascia Exposed: No Necrotic Amount: None Present (0%) Fat Layer (Subcutaneous Tissue) Exposed: Yes Tendon Exposed: No Muscle Exposed: No Joint Exposed: No Bone Exposed: No Treatment Notes Wound #19 (Sacrum) Cleanser Peri-Wound Care Topical Primary Dressing KerraCel  Ag Gelling Fiber Dressing, 4x5 in (silver alginate) Discharge Instruction: Apply silver alginate to wound bed, do not pack, just tuck lightly into wounds as instructed Secondary Dressing ABD Pad, 8x10 Discharge Instruction: Apply over primary dressing as directed. Secured With 28M Medipore H Soft Cloth  Surgical T 4 x 2 (in/yd) ape Discharge Instruction: Secure dressing with tape as directed. Compression Wrap Compression Stockings Add-Ons Electronic Signature(s) Signed: 11/03/2020 5:46:35 PM By: Levan Hurst RN, BSN Entered By: Levan Hurst on 11/03/2020 08:54:09 -------------------------------------------------------------------------------- Wound Assessment Details Patient Name: Date of Service: CA RA [place], [name] 11/03/2020 8:45 A M Medical Record Number: [id number] Patient Account Number: [id number] Date of Birth/Sex: Treating RN: [birth year] (55 y.o. Janyth Contes Primary Care Ady Heimann: [name] Other Clinician: Referring Lagretta Loseke: Treating Jarel Cuadra/Extender: Seymour Bars Weeks in Treatment: 170 Wound Status Wound Number: 21 Primary Etiology: Pressure Ulcer Wound Location: Right, Medial Ischium Wound Status: Open Wounding Event: Gradually Appeared Comorbid History: Anemia, Hypertension, Colitis, Osteomyelitis, Paraplegia Date Acquired: 10/02/2012 Weeks Of Treatment: 135 Clustered Wound: No Wound Measurements Length: (cm) 1 Width: (cm) 2 Depth: (cm) 1.7 Area: (cm) 1.571 Volume: (cm) 2.67 % Reduction in Area: 44.4% % Reduction in Volume: 52.8% Epithelialization: Medium (34-66%) Tunneling: No Undermining: No Wound Description Classification: Category/Stage II Wound Margin: Well defined, not attached Exudate Amount: Small Exudate Type: Serosanguineous Exudate Color: red, brown Foul Odor After Cleansing: No Slough/Fibrino No Wound Bed Granulation Amount: Large (67-100%) Exposed Structure Granulation Quality: Pink, Pale Fascia Exposed: No Necrotic Amount: None Present (0%) Fat Layer (Subcutaneous Tissue) Exposed: Yes Tendon Exposed: No Muscle Exposed: No Joint Exposed: No Bone Exposed: No Treatment Notes Wound #21 (Ischium) Wound Laterality: Right, Medial Cleanser Peri-Wound Care Topical Primary Dressing KerraCel Ag  Gelling Fiber Dressing, 4x5 in (silver alginate) Discharge Instruction: Apply silver alginate to wound bed, do not pack, just tuck lightly into wounds as instructed Secondary Dressing ABD Pad, 8x10 Discharge Instruction: Apply over primary dressing as directed. Secured With 28M Medipore H Soft Cloth Surgical T 4 x 2 (in/yd) ape Discharge Instruction: Secure dressing with tape as directed. Compression Wrap Compression Stockings Add-Ons Electronic Signature(s) Signed: 11/03/2020 5:46:35 PM By: Levan Hurst RN, BSN Entered By: Levan Hurst on 11/03/2020 08:56:37 -------------------------------------------------------------------------------- Wound Assessment Details Patient Name: Date of Service: CA RA [place], [name] 11/03/2020 8:45 A M Medical Record Number: [id number] Patient Account Number: [id number] Date of Birth/Sex: Treating RN: 1966/04/11 (55 y.o. Janyth Contes Primary Care Diann Bangerter: [name] Other Clinician: Referring Dorothey Oetken: Treating Dorothey Oetken/Extender: Seymour Bars Weeks in Treatment: 170 Wound Status Wound Number: 22 Primary Etiology: Pressure Ulcer Wound Location: Left Ischium Wound Status: Open Wounding Event: Gradually Appeared Comorbid History: Anemia, Hypertension, Colitis, Osteomyelitis, Paraplegia Date Acquired: 10/02/2013 Weeks Of Treatment: 135 Clustered Wound: No Wound Measurements Length: (cm) 2 Width: (cm) 0.5 Depth: (cm) 3.4 Area: (cm) 0.785 Volume: (cm) 2.67 % Reduction in Area: -85.1% % Reduction in Volume: -124.7% Epithelialization: Small (1-33%) Tunneling: No Undermining: No Wound Description Classification: Category/Stage II Wound Margin: Epibole Exudate Amount: Medium Exudate Type: Serosanguineous Exudate Color: red, brown Foul Odor After Cleansing: No Slough/Fibrino No Wound Bed Granulation Amount: Large (67-100%) Exposed Structure Granulation Quality: Pink, Pale Fascia Exposed: No Necrotic  Amount: None Present (0%) Fat Layer (Subcutaneous Tissue) Exposed: Yes Tendon Exposed: No Muscle Exposed: No Joint Exposed: No Bone Exposed: No Treatment Notes Wound #22 (Ischium) Wound Laterality: Left Cleanser Peri-Wound Care Topical Primary Dressing KerraCel Ag Gelling Fiber Dressing, 4x5 in (silver alginate) Discharge Instruction: Apply silver  alginate to wound bed, do not pack, just tuck lightly into wounds as instructed Secondary Dressing ABD Pad, 8x10 Discharge Instruction: Apply over primary dressing as directed. Secured With 62M Medipore H Soft Cloth Surgical T 4 x 2 (in/yd) ape Discharge Instruction: Secure dressing with tape as directed. Compression Wrap Compression Stockings Add-Ons Electronic Signature(s) Signed: 11/03/2020 5:46:35 PM By: Levan Hurst RN, BSN Entered By: Levan Hurst on 11/03/2020 08:56:17 -------------------------------------------------------------------------------- Wound Assessment Details Patient Name: Date of Service: CA RA [place], [name] 11/03/2020 8:45 A M Medical Record Number: [id number] Patient Account Number: [id number] Date of Birth/Sex: Treating RN: 1966-02-17 (55 y.o. Janyth Contes Primary Care Lang Zingg: [name] Other Clinician: Referring Dayra Rapley: Treating Seara Hinesley/Extender: Seymour Bars Weeks in Treatment: 170 Wound Status Wound Number: 23 Primary Etiology: Pressure Ulcer Wound Location: Right Gluteus Wound Status: Open Wounding Event: Gradually Appeared Comorbid History: Anemia, Hypertension, Colitis, Osteomyelitis, Paraplegia Date Acquired: 09/18/2018 Weeks Of Treatment: 108 Clustered Wound: No Wound Measurements Length: (cm) 1.4 Width: (cm) 0.8 Depth: (cm) 1.5 Area: (cm) 0.88 Volume: (cm) 1.319 % Reduction in Area: -154.3% % Reduction in Volume: -36.3% Epithelialization: Small (1-33%) Tunneling: No Undermining: No Wound Description Classification: Category/Stage II Wound  Margin: Well defined, not attached Exudate Amount: Medium Exudate Type: Serosanguineous Exudate Color: red, brown Foul Odor After Cleansing: No Slough/Fibrino No Wound Bed Granulation Amount: Large (67-100%) Exposed Structure Granulation Quality: Pink Fascia Exposed: No Necrotic Amount: None Present (0%) Fat Layer (Subcutaneous Tissue) Exposed: Yes Tendon Exposed: No Muscle Exposed: No Joint Exposed: No Bone Exposed: No Treatment Notes Wound #23 (Gluteus) Wound Laterality: Right Cleanser Peri-Wound Care Topical Primary Dressing KerraCel Ag Gelling Fiber Dressing, 4x5 in (silver alginate) Discharge Instruction: Apply silver alginate to wound bed, do not pack, just tuck lightly into wounds as instructed Secondary Dressing ABD Pad, 8x10 Discharge Instruction: Apply over primary dressing as directed. Secured With 62M Medipore H Soft Cloth Surgical T 4 x 2 (in/yd) ape Discharge Instruction: Secure dressing with tape as directed. Compression Wrap Compression Stockings Add-Ons Electronic Signature(s) Signed: 11/03/2020 5:46:35 PM By: Levan Hurst RN, BSN Entered By: Levan Hurst on 11/03/2020 08:57:06 -------------------------------------------------------------------------------- Vitals Details Patient Name: Date of Service: [name], [name] 11/03/2020 8:45 A M Medical Record Number: [id number] Patient Account Number: [id number] Date of Birth/Sex: Treating RN: 1965/11/08 (55 y.o. Janyth Contes Primary Care Aviva Wolfer: [name] Other Clinician: Referring Anira Senegal: Treating Rejoice Heatwole/Extender: Seymour Bars Weeks in Treatment: 170 Vital Signs Time Taken: 08:48 Temperature (F): 98.4 Height (in): 72 Pulse (bpm): 90 Weight (lbs): 215 Respiratory Rate (breaths/min): 20 Body Mass Index (BMI): 29.2 Blood Pressure (mmHg): 175/75 Reference Range: 80 - 120 mg / dl Electronic Signature(s) Signed: 11/03/2020 5:46:35 PM By: Levan Hurst RN,  BSN Entered By: Levan Hurst on 11/03/2020 08:48:27

## 2020-11-12 ENCOUNTER — Inpatient Hospital Stay (HOSPITAL_COMMUNITY)
Admission: EM | Admit: 2020-11-12 | Discharge: 2020-11-18 | DRG: 871 | Disposition: A | Discharge status: Home Health | Payer: Medicare Other | Attending: Internal Medicine | Admitting: Internal Medicine

## 2020-11-12 ENCOUNTER — Inpatient Hospital Stay (HOSPITAL_COMMUNITY): Payer: Medicare Other

## 2020-11-12 ENCOUNTER — Emergency Department (HOSPITAL_COMMUNITY): Payer: Medicare Other

## 2020-11-12 DIAGNOSIS — A419 Sepsis, unspecified organism: Principal | ICD-10-CM

## 2020-11-12 DIAGNOSIS — F1414 Cocaine abuse with cocaine-induced mood disorder: Secondary | ICD-10-CM | POA: Diagnosis present

## 2020-11-12 DIAGNOSIS — W3400XS Accidental discharge from unspecified firearms or gun, sequela: Secondary | ICD-10-CM | POA: Diagnosis not present

## 2020-11-12 DIAGNOSIS — G9340 Encephalopathy, unspecified: Principal | ICD-10-CM

## 2020-11-12 DIAGNOSIS — E662 Morbid (severe) obesity with alveolar hypoventilation: Secondary | ICD-10-CM | POA: Diagnosis present

## 2020-11-12 DIAGNOSIS — Z6841 Body Mass Index (BMI) 40.0 and over, adult: Secondary | ICD-10-CM

## 2020-11-12 DIAGNOSIS — F1994 Other psychoactive substance use, unspecified with psychoactive substance-induced mood disorder: Secondary | ICD-10-CM | POA: Diagnosis present

## 2020-11-12 DIAGNOSIS — M549 Dorsalgia, unspecified: Secondary | ICD-10-CM | POA: Diagnosis present

## 2020-11-12 DIAGNOSIS — I10 Essential (primary) hypertension: Secondary | ICD-10-CM | POA: Diagnosis present

## 2020-11-12 DIAGNOSIS — J9601 Acute respiratory failure with hypoxia: Secondary | ICD-10-CM | POA: Diagnosis present

## 2020-11-12 DIAGNOSIS — K219 Gastro-esophageal reflux disease without esophagitis: Secondary | ICD-10-CM | POA: Diagnosis present

## 2020-11-12 DIAGNOSIS — Z23 Encounter for immunization: Secondary | ICD-10-CM | POA: Diagnosis not present

## 2020-11-12 DIAGNOSIS — Z993 Dependence on wheelchair: Secondary | ICD-10-CM

## 2020-11-12 DIAGNOSIS — G894 Chronic pain syndrome: Secondary | ICD-10-CM | POA: Diagnosis present

## 2020-11-12 DIAGNOSIS — G8220 Paraplegia, unspecified: Secondary | ICD-10-CM | POA: Diagnosis present

## 2020-11-12 DIAGNOSIS — R55 Syncope and collapse: Secondary | ICD-10-CM | POA: Diagnosis present

## 2020-11-12 DIAGNOSIS — N179 Acute kidney failure, unspecified: Secondary | ICD-10-CM | POA: Diagnosis present

## 2020-11-12 DIAGNOSIS — G929 Unspecified toxic encephalopathy: Secondary | ICD-10-CM | POA: Diagnosis present

## 2020-11-12 DIAGNOSIS — Z79899 Other long term (current) drug therapy: Secondary | ICD-10-CM

## 2020-11-12 DIAGNOSIS — Z960 Presence of urogenital implants: Secondary | ICD-10-CM | POA: Diagnosis present

## 2020-11-12 DIAGNOSIS — S34109S Unspecified injury to unspecified level of lumbar spinal cord, sequela: Secondary | ICD-10-CM

## 2020-11-12 DIAGNOSIS — B59 Pneumocystosis: Secondary | ICD-10-CM | POA: Diagnosis not present

## 2020-11-12 DIAGNOSIS — T17998A Other foreign object in respiratory tract, part unspecified causing other injury, initial encounter: Secondary | ICD-10-CM | POA: Diagnosis present

## 2020-11-12 DIAGNOSIS — J189 Pneumonia, unspecified organism: Secondary | ICD-10-CM | POA: Diagnosis not present

## 2020-11-12 DIAGNOSIS — T68XXXA Hypothermia, initial encounter: Secondary | ICD-10-CM | POA: Diagnosis present

## 2020-11-12 DIAGNOSIS — Z841 Family history of disorders of kidney and ureter: Secondary | ICD-10-CM

## 2020-11-12 DIAGNOSIS — L89154 Pressure ulcer of sacral region, stage 4: Secondary | ICD-10-CM | POA: Diagnosis present

## 2020-11-12 DIAGNOSIS — Z933 Colostomy status: Secondary | ICD-10-CM | POA: Diagnosis not present

## 2020-11-12 DIAGNOSIS — M25519 Pain in unspecified shoulder: Secondary | ICD-10-CM | POA: Diagnosis present

## 2020-11-12 DIAGNOSIS — Z20822 Contact with and (suspected) exposure to covid-19: Secondary | ICD-10-CM | POA: Diagnosis present

## 2020-11-12 DIAGNOSIS — T40414A Poisoning by fentanyl or fentanyl analogs, undetermined, initial encounter: Secondary | ICD-10-CM | POA: Diagnosis present

## 2020-11-12 DIAGNOSIS — R6520 Severe sepsis without septic shock: Secondary | ICD-10-CM | POA: Diagnosis present

## 2020-11-12 DIAGNOSIS — J690 Pneumonitis due to inhalation of food and vomit: Secondary | ICD-10-CM | POA: Diagnosis present

## 2020-11-12 DIAGNOSIS — F17210 Nicotine dependence, cigarettes, uncomplicated: Secondary | ICD-10-CM | POA: Diagnosis present

## 2020-11-12 DIAGNOSIS — T50901S Poisoning by unspecified drugs, medicaments and biological substances, accidental (unintentional), sequela: Secondary | ICD-10-CM | POA: Diagnosis not present

## 2020-11-12 LAB — URINALYSIS, MICROSCOPIC (REFLEX)

## 2020-11-12 LAB — COMPREHENSIVE METABOLIC PANEL
ALT: 21 U/L (ref 0–44)
AST: 23 U/L (ref 15–41)
Albumin: 3.4 g/dL — ABNORMAL LOW (ref 3.5–5.0)
Alkaline Phosphatase: 79 U/L (ref 38–126)
Anion gap: 12 (ref 5–15)
BUN: 36 mg/dL — ABNORMAL HIGH (ref 6–20)
CO2: 22 mmol/L (ref 22–32)
Calcium: 8.8 mg/dL — ABNORMAL LOW (ref 8.9–10.3)
Chloride: 103 mmol/L (ref 98–111)
Creatinine, Ser: 1.52 mg/dL — ABNORMAL HIGH (ref 0.61–1.24)
GFR, Estimated: 53 mL/min — ABNORMAL LOW (ref >60)
Glucose, Bld: 117 mg/dL — ABNORMAL HIGH (ref 70–99)
Potassium: 4.4 mmol/L (ref 3.5–5.1)
Sodium: 137 mmol/L (ref 135–145)
Total Bilirubin: 0.4 mg/dL (ref 0.3–1.2)
Total Protein: 8 g/dL (ref 6.5–8.1)

## 2020-11-12 LAB — URINALYSIS, ROUTINE W REFLEX MICROSCOPIC
Bilirubin Urine: NEGATIVE
Glucose, UA: NEGATIVE mg/dL
Ketones, ur: NEGATIVE mg/dL
Nitrite: POSITIVE — AB
Protein, ur: 100 mg/dL — AB
Specific Gravity, Urine: 1.02 (ref 1.005–1.030)
pH: 7 (ref 5.0–8.0)

## 2020-11-12 LAB — CBG MONITORING, ED: Glucose-Capillary: 182 mg/dL — ABNORMAL HIGH (ref 70–99)

## 2020-11-12 LAB — I-STAT ARTERIAL BLOOD GAS, ED
Acid-base deficit: 3 mmol/L — ABNORMAL HIGH (ref 0.0–2.0)
Bicarbonate: 25 mmol/L (ref 20.0–28.0)
Calcium, Ion: 1.2 mmol/L (ref 1.15–1.40)
HCT: 43 % (ref 39.0–52.0)
Hemoglobin: 14.6 g/dL (ref 13.0–17.0)
O2 Saturation: 92 %
Patient temperature: 94.3
Potassium: 4.2 mmol/L (ref 3.5–5.1)
Sodium: 138 mmol/L (ref 135–145)
TCO2: 27 mmol/L (ref 22–32)
pCO2 arterial: 51.8 mmHg — ABNORMAL HIGH (ref 32.0–48.0)
pH, Arterial: 7.28 — ABNORMAL LOW (ref 7.350–7.450)
pO2, Arterial: 65 mmHg — ABNORMAL LOW (ref 83.0–108.0)

## 2020-11-12 LAB — RAPID URINE DRUG SCREEN, HOSP PERFORMED
Amphetamines: NOT DETECTED
Barbiturates: NOT DETECTED
Benzodiazepines: NOT DETECTED
Cocaine: NOT DETECTED
Opiates: NOT DETECTED
Tetrahydrocannabinol: NOT DETECTED

## 2020-11-12 LAB — LACTIC ACID, PLASMA
Lactic Acid, Venous: 6.1 mmol/L (ref 0.5–1.9)
Lactic Acid, Venous: 2.9 mmol/L (ref 0.5–1.9)

## 2020-11-12 LAB — CBC WITH DIFFERENTIAL/PLATELET
Abs Immature Granulocytes: 0.09 10*3/uL — ABNORMAL HIGH (ref 0.00–0.07)
Basophils Absolute: 0 10*3/uL (ref 0.0–0.1)
Basophils Relative: 0 %
Eosinophils Absolute: 0 10*3/uL (ref 0.0–0.5)
Eosinophils Relative: 0 %
HCT: 46.6 % (ref 39.0–52.0)
Hemoglobin: 13 g/dL (ref 13.0–17.0)
Immature Granulocytes: 1 %
Lymphocytes Relative: 9 %
Lymphs Abs: 1.1 10*3/uL (ref 0.7–4.0)
MCH: 27.5 pg (ref 26.0–34.0)
MCHC: 27.9 g/dL — ABNORMAL LOW (ref 30.0–36.0)
MCV: 98.7 fL (ref 80.0–100.0)
Monocytes Absolute: 0.4 10*3/uL (ref 0.1–1.0)
Monocytes Relative: 3 %
Neutro Abs: 9.8 10*3/uL — ABNORMAL HIGH (ref 1.7–7.7)
Neutrophils Relative %: 87 %
Platelets: 294 10*3/uL (ref 150–400)
RBC: 4.72 MIL/uL (ref 4.22–5.81)
RDW: 16.1 % — ABNORMAL HIGH (ref 11.5–15.5)
WBC: 11.3 10*3/uL — ABNORMAL HIGH (ref 4.0–10.5)
nRBC: 0.2 % (ref 0.0–0.2)

## 2020-11-12 LAB — PROTIME-INR
INR: 1 (ref 0.8–1.2)
Prothrombin Time: 13.1 seconds (ref 11.4–15.2)

## 2020-11-12 LAB — RESP PANEL BY RT-PCR (FLU A&B, COVID) ARPGX2
Influenza A by PCR: NEGATIVE
Influenza B by PCR: NEGATIVE
SARS Coronavirus 2 by RT PCR: NEGATIVE

## 2020-11-12 LAB — ETHANOL: Alcohol, Ethyl (B): <10 mg/dL (ref <10)

## 2020-11-12 LAB — APTT: aPTT: 27 seconds (ref 24–36)

## 2020-11-12 MED ORDER — SODIUM CHLORIDE 0.9 % IV SOLN
2.0000 g | Freq: Three times a day (TID) | INTRAVENOUS | Status: DC
  Administered 2020-11-13 – 2020-11-15 (×8): 2 g via INTRAVENOUS
  Filled 2020-11-12 (×8): qty 2

## 2020-11-12 MED ORDER — LACTATED RINGERS IV SOLN: INTRAVENOUS | Status: AC

## 2020-11-12 MED ORDER — HEPARIN SODIUM (PORCINE) 5000 UNIT/ML IJ SOLN
5000.0000 [IU] | Freq: Three times a day (TID) | INTRAMUSCULAR | Status: DC
  Administered 2020-11-13 – 2020-11-18 (×17): 5000 [IU] via SUBCUTANEOUS
  Filled 2020-11-12 (×18): qty 1

## 2020-11-12 MED ORDER — VANCOMYCIN HCL 1750 MG/350ML IV SOLN
1750.0000 mg | INTRAVENOUS | Status: DC
  Filled 2020-11-12: qty 350

## 2020-11-12 MED ORDER — IPRATROPIUM-ALBUTEROL 0.5-2.5 (3) MG/3ML IN SOLN
3.0000 mL | Freq: Four times a day (QID) | RESPIRATORY_TRACT | Status: DC
  Administered 2020-11-12 – 2020-11-14 (×6): 3 mL via RESPIRATORY_TRACT
  Filled 2020-11-12 (×6): qty 3

## 2020-11-12 MED ORDER — NALOXONE HCL 2 MG/2ML IJ SOSY
PREFILLED_SYRINGE | INTRAMUSCULAR | Status: AC
  Administered 2020-11-12: 1 mg via INTRAVENOUS
  Filled 2020-11-12: qty 2

## 2020-11-12 MED ORDER — LACTATED RINGERS IV BOLUS (SEPSIS)
1000.0000 mL | Freq: Once | INTRAVENOUS | Status: AC
  Administered 2020-11-12: 1000 mL via INTRAVENOUS

## 2020-11-12 MED ORDER — METRONIDAZOLE IN NACL 5-0.79 MG/ML-% IV SOLN
500.0000 mg | Freq: Once | INTRAVENOUS | Status: AC
  Administered 2020-11-12: 500 mg via INTRAVENOUS
  Filled 2020-11-12: qty 100

## 2020-11-12 MED ORDER — VANCOMYCIN HCL 10 G IV SOLR
2500.0000 mg | Freq: Once | INTRAVENOUS | Status: AC
  Administered 2020-11-12: 2500 mg via INTRAVENOUS
  Filled 2020-11-12: qty 2500

## 2020-11-12 MED ORDER — SODIUM CHLORIDE 0.9 % IV SOLN
2.0000 g | Freq: Once | INTRAVENOUS | Status: AC
  Administered 2020-11-12: 2 g via INTRAVENOUS
  Filled 2020-11-12: qty 2

## 2020-11-12 MED ORDER — VANCOMYCIN HCL 1000 MG/200ML IV SOLN: 1000.0000 mg | Freq: Once | INTRAVENOUS | Status: DC

## 2020-11-12 MED ORDER — DICLOFENAC SODIUM 1 % EX GEL
4.0000 g | Freq: Four times a day (QID) | CUTANEOUS | Status: DC
  Administered 2020-11-13 – 2020-11-18 (×23): 4 g via TOPICAL
  Filled 2020-11-12 (×2): qty 100

## 2020-11-12 MED ORDER — LACTATED RINGERS IV BOLUS (SEPSIS)
500.0000 mL | Freq: Once | INTRAVENOUS | Status: AC
  Administered 2020-11-12: 500 mL via INTRAVENOUS

## 2020-11-12 MED ORDER — METRONIDAZOLE IN NACL 5-0.79 MG/ML-% IV SOLN
500.0000 mg | Freq: Three times a day (TID) | INTRAVENOUS | Status: DC
  Administered 2020-11-13 – 2020-11-15 (×8): 500 mg via INTRAVENOUS
  Filled 2020-11-12 (×8): qty 100

## 2020-11-12 MED ORDER — HYDROMORPHONE HCL 1 MG/ML IJ SOLN: 0.5000 mg | INTRAMUSCULAR | Status: DC | PRN

## 2020-11-12 MED ORDER — KETOROLAC TROMETHAMINE 15 MG/ML IJ SOLN: 15.0000 mg | Freq: Four times a day (QID) | INTRAMUSCULAR | Status: AC | PRN

## 2020-11-12 MED ORDER — PANTOPRAZOLE SODIUM 40 MG IV SOLR
40.0000 mg | INTRAVENOUS | Status: DC
  Administered 2020-11-12 – 2020-11-13 (×2): 40 mg via INTRAVENOUS
  Filled 2020-11-12 (×2): qty 40

## 2020-11-12 MED ORDER — NALOXONE HCL 2 MG/2ML IJ SOSY: 1.0000 mg | PREFILLED_SYRINGE | Freq: Once | INTRAMUSCULAR | Status: AC

## 2020-11-12 NOTE — ED Notes (Signed)
Pt's ostomy full of formed stool, separated from skin, light brown liquid stool leaking, ostomy supplies ordered. IV team at bedside for PIV start.
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
## 2020-11-12 NOTE — ED Provider Notes (Signed)
This patient has been reexamined by myself, he intermittently wakes up, he seems to be mildly agitated when you ask him to do things, it is very difficult to rollover but he does have some sacral decubiti which appear to be in good repair, he is morbidly obese making it difficult to care for this.  He has a indwelling Foley catheter now after his visit today to get urine, drug screens negative, urine shows possible UTI, no obvious infection in the chest.  Oxygen levels are better, he is now on a 5 L nonrebreather.  His hypothermia has been aggressively treated and is coming up now 95.3 degrees.  He is normotensive, heart rate of 68, labs have been rather reassuring.  The cause of his encephalopathy is not entirely clear.  At change of shift I added on Tylenol and salicylate levels to ensure that there was no coingestions.  The brother is now arrived and states that he has lost friends recently, he has not been talking depression or suicide but has a history of some type of substance abuse and overdose in the past.  I discussed this care with the hospitalist who will come to admit.   Noemi Chapel, MD 11/12/20 [phone]

## 2020-11-12 NOTE — ED Notes (Signed)
Ostomy bag repolaced

## 2020-11-12 NOTE — ED Notes (Signed)
Pt returned from CT scan.
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
## 2020-11-12 NOTE — ED Triage Notes (Signed)
Pt BIBA from home. EMS was called by brother when he found the patient slumped over and unresponsive. Pt was 70% on RA with agonal breathing. EMS provided some rescue breaths and 2mg  Narcan with response. Pt is awake and aggressive towards staff and stating "Save me, I want to live. I'm dying". Resp even and labored.

## 2020-11-12 NOTE — Sepsis Progress Note (Signed)
eLink is monitoring this Code Sepsis. °

## 2020-11-12 NOTE — Sepsis Progress Note (Signed)
Secure chat sent to Dr [name] Heck requesting he write a note about the IBW that will be used to determine weight based fluid dosing. He replied that he would include that value in his note.

## 2020-11-13 ENCOUNTER — Inpatient Hospital Stay (HOSPITAL_COMMUNITY): Payer: Medicare Other

## 2020-11-13 DIAGNOSIS — B59 Pneumocystosis: Secondary | ICD-10-CM

## 2020-11-13 LAB — CBC
HCT: 37.4 % — ABNORMAL LOW (ref 39.0–52.0)
Hemoglobin: 11.1 g/dL — ABNORMAL LOW (ref 13.0–17.0)
MCH: 28 pg (ref 26.0–34.0)
MCHC: 29.7 g/dL — ABNORMAL LOW (ref 30.0–36.0)
MCV: 94.2 fL (ref 80.0–100.0)
Platelets: 192 10*3/uL (ref 150–400)
RBC: 3.97 MIL/uL — ABNORMAL LOW (ref 4.22–5.81)
RDW: 15.9 % — ABNORMAL HIGH (ref 11.5–15.5)
WBC: 15 10*3/uL — ABNORMAL HIGH (ref 4.0–10.5)
nRBC: 0.1 % (ref 0.0–0.2)

## 2020-11-13 LAB — BASIC METABOLIC PANEL
Anion gap: 8 (ref 5–15)
BUN: 22 mg/dL — ABNORMAL HIGH (ref 6–20)
CO2: 30 mmol/L (ref 22–32)
Calcium: 8.4 mg/dL — ABNORMAL LOW (ref 8.9–10.3)
Chloride: 103 mmol/L (ref 98–111)
Creatinine, Ser: 1.2 mg/dL (ref 0.61–1.24)
GFR, Estimated: >60 mL/min (ref >60)
Glucose, Bld: 102 mg/dL — ABNORMAL HIGH (ref 70–99)
Potassium: 5.3 mmol/L — ABNORMAL HIGH (ref 3.5–5.1)
Sodium: 141 mmol/L (ref 135–145)

## 2020-11-13 LAB — BLOOD GAS, ARTERIAL
Acid-Base Excess: 3.2 mmol/L — ABNORMAL HIGH (ref 0.0–2.0)
Bicarbonate: 28.6 mmol/L — ABNORMAL HIGH (ref 20.0–28.0)
Drawn by: 519031
FIO2: 44
O2 Saturation: 97.5 %
Patient temperature: 36.5
pCO2 arterial: 53.8 mmHg — ABNORMAL HIGH (ref 32.0–48.0)
pH, Arterial: 7.341 — ABNORMAL LOW (ref 7.350–7.450)
pO2, Arterial: 91.6 mmHg (ref 83.0–108.0)

## 2020-11-13 LAB — MRSA PCR SCREENING: MRSA by PCR: NEGATIVE

## 2020-11-13 MED ORDER — ORAL CARE MOUTH RINSE
15.0000 mL | Freq: Two times a day (BID) | OROMUCOSAL | Status: DC
  Administered 2020-11-14 – 2020-11-18 (×9): 15 mL via OROMUCOSAL

## 2020-11-13 MED ORDER — CHLORHEXIDINE GLUCONATE CLOTH 2 % EX PADS
6.0000 | MEDICATED_PAD | Freq: Every day | CUTANEOUS | Status: DC
  Administered 2020-11-13 – 2020-11-18 (×5): 6 via TOPICAL

## 2020-11-13 MED ORDER — SODIUM ZIRCONIUM CYCLOSILICATE 5 G PO PACK
5.0000 g | PACK | Freq: Every day | ORAL | Status: AC
  Administered 2020-11-13: 5 g via ORAL
  Filled 2020-11-13: qty 1

## 2020-11-13 MED ORDER — VANCOMYCIN HCL 1250 MG/250ML IV SOLN
1250.0000 mg | Freq: Two times a day (BID) | INTRAVENOUS | Status: DC
  Administered 2020-11-13 – 2020-11-15 (×4): 1250 mg via INTRAVENOUS
  Filled 2020-11-13 (×5): qty 250

## 2020-11-13 MED ORDER — VANCOMYCIN HCL 2000 MG/400ML IV SOLN
2000.0000 mg | Freq: Two times a day (BID) | INTRAVENOUS | Status: DC
  Filled 2020-11-13: qty 400

## 2020-11-13 MED ORDER — SODIUM ZIRCONIUM CYCLOSILICATE 5 G PO PACK: 5.0000 g | PACK | Freq: Two times a day (BID) | ORAL | Status: DC
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
## 2020-11-13 NOTE — ED Notes (Signed)
Pt brother updated via telephone
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
## 2020-11-13 NOTE — ED Notes (Signed)
Patient's sister updated via telphone

## 2020-11-13 NOTE — Plan of Care (Signed)

## 2020-11-13 NOTE — Progress Notes (Signed)
RT NOTES: ABG obtained and sent to lab. Lab tech notified.  

## 2020-11-14 DIAGNOSIS — A419 Sepsis, unspecified organism: Principal | ICD-10-CM

## 2020-11-14 DIAGNOSIS — R6520 Severe sepsis without septic shock: Secondary | ICD-10-CM

## 2020-11-14 LAB — COMPREHENSIVE METABOLIC PANEL
ALT: 16 U/L (ref 0–44)
AST: 22 U/L (ref 15–41)
Albumin: 2.8 g/dL — ABNORMAL LOW (ref 3.5–5.0)
Alkaline Phosphatase: 59 U/L (ref 38–126)
Anion gap: 8 (ref 5–15)
BUN: 17 mg/dL (ref 6–20)
CO2: 28 mmol/L (ref 22–32)
Calcium: 8.5 mg/dL — ABNORMAL LOW (ref 8.9–10.3)
Chloride: 105 mmol/L (ref 98–111)
Creatinine, Ser: 1.16 mg/dL (ref 0.61–1.24)
GFR, Estimated: >60 mL/min (ref >60)
Glucose, Bld: 108 mg/dL — ABNORMAL HIGH (ref 70–99)
Potassium: 4.7 mmol/L (ref 3.5–5.1)
Sodium: 141 mmol/L (ref 135–145)
Total Bilirubin: 0.7 mg/dL (ref 0.3–1.2)
Total Protein: 6.7 g/dL (ref 6.5–8.1)

## 2020-11-14 LAB — CBC
HCT: 37.1 % — ABNORMAL LOW (ref 39.0–52.0)
Hemoglobin: 10.8 g/dL — ABNORMAL LOW (ref 13.0–17.0)
MCH: 27.1 pg (ref 26.0–34.0)
MCHC: 29.1 g/dL — ABNORMAL LOW (ref 30.0–36.0)
MCV: 93.2 fL (ref 80.0–100.0)
Platelets: 188 10*3/uL (ref 150–400)
RBC: 3.98 MIL/uL — ABNORMAL LOW (ref 4.22–5.81)
RDW: 16.3 % — ABNORMAL HIGH (ref 11.5–15.5)
WBC: 9.3 10*3/uL (ref 4.0–10.5)
nRBC: 0 % (ref 0.0–0.2)

## 2020-11-14 LAB — MAGNESIUM: Magnesium: 2 mg/dL (ref 1.7–2.4)

## 2020-11-14 MED ORDER — PANTOPRAZOLE SODIUM 40 MG PO TBEC
40.0000 mg | DELAYED_RELEASE_TABLET | Freq: Every day | ORAL | Status: DC
  Administered 2020-11-14 – 2020-11-18 (×5): 40 mg via ORAL
  Filled 2020-11-14 (×5): qty 1

## 2020-11-14 MED ORDER — ACETAMINOPHEN 325 MG PO TABS
650.0000 mg | ORAL_TABLET | Freq: Four times a day (QID) | ORAL | Status: DC | PRN
  Administered 2020-11-14 (×2): 650 mg via ORAL
  Filled 2020-11-14 (×2): qty 2

## 2020-11-14 MED ORDER — IPRATROPIUM-ALBUTEROL 0.5-2.5 (3) MG/3ML IN SOLN: 3.0000 mL | Freq: Four times a day (QID) | RESPIRATORY_TRACT | Status: DC | PRN

## 2020-11-14 NOTE — Plan of Care (Signed)
Patient has period of apnea despite CPAP, desat into low 60s.  Patient quickly compensates once aroused.  Respiratory to bedside to assess mask and adjustment of CPAP settings.  Patient reports that he does not wear a CPap/Bipap at home.  He states that he has wore one a long time ago and does not have any of the supplies.

## 2020-11-14 NOTE — Progress Notes (Signed)
MD called for patient c/o right thigh pain and temp increased to 100.3 oral. Request for tylenol, will continue to monitor.

## 2020-11-14 NOTE — Progress Notes (Signed)
Had long discussion this AM concerning patients pain.  Patient states that he "want help with my chronic arthritis and back pain".  Patient states that he feels "no one wants to help me get on medications to help my arthritis".  He stated that he was able to get himself to and from a wheelchair however since his pain has increased over the last months he is unable to get himself out of the bed and he cries out in pain throughout the day and night and fears that his brother will not want to continue to help him in the state that he is in.

## 2020-11-14 NOTE — Plan of Care (Signed)

## 2020-11-14 NOTE — Progress Notes (Signed)

## 2020-11-14 NOTE — TOC Initial Note (Signed)
Transition of Care (TOC) - Initial/Assessment Note  Marvetta Gibbons RN, BSN Transitions of Care Unit 4E- RN Case Manager See Treatment Team for direct phone # Weekend cross coverage   Patient Details  Name: [name] MRN: [id number] Date of Birth: 03-Aug-1966  Transition of Care [facility]) CM/SW Contact:    Dawayne Patricia, RN Phone Number: 11/14/2020, 2:21 PM  Clinical Narrative:                 Noted referral for home trilogy, call made to [employer] to review eligibility, [employer] to place order form on shadow chart, narrative note placed for MD signature- once those have been signed- [employer] will f/u and start insurance approval process.  TOC to continue to follow for transition of care needs.   Expected Discharge Plan: [place] Barriers to Discharge: Continued Medical Work up   Patient Goals and CMS Choice        Expected Discharge Plan and Services Expected Discharge Plan: [place] Acute Care Choice: Durable Medical Equipment                   DME Arranged: NIV DME Agency: [employer] Date DME Agency Contacted: 11/14/20 Time DME Agency Contacted: 10 Representative spoke with at DME Agency: [name]            Prior Living Arrangements/Services     Patient language and need for interpreter reviewed:: Yes        Need for Family Participation in Patient Care: Yes (Comment) Care giver support system in place?: Yes (comment)   Criminal Activity/Legal Involvement Pertinent to Current Situation/Hospitalization: No - Comment as needed  Activities of Daily Living      Permission Sought/Granted                  Emotional Assessment           Psych Involvement: No (comment)  Admission diagnosis:  Encephalopathy [G93.40] PNA (pneumonia) [J18.9] Sepsis ([place]) [A41.9] Patient Active Problem List   Diagnosis Date Noted  . Sepsis ([place]) 11/12/2020  . Encephalopathy   . Pneumonia of both lungs due to  infectious organism   . ARF (acute renal failure) ([place]) 07/09/2019  . Acute metabolic encephalopathy 28/00/3491  . Polysubstance abuse ([place]) 07/09/2019  . Substance induced mood disorder ([place]) 08/21/2018  . Acute kidney injury ([place]) 08/13/2018  . Altered mental status 02/17/2018  . Acute lower UTI 02/17/2018  . Vomiting feces   . Constipation 02/14/2017  . Colitis 02/13/2017  . Pressure injury of skin 02/13/2017  . Fecal impaction ([place]) 12/07/2016  . Cocaine abuse ([place])   . Decubitus ulcer of sacral region, stage 4 ([place])   . Urinary tract infection associated with indwelling urethral catheter ([place])   . Enteritis due to Clostridium difficile   . Abdominal pain 07/19/2016  . Osteomyelitis with necrosis of sacrum  06/01/2016  . Colostomy in place for fecal diversion 06/01/2016  . Protein-calorie malnutrition, severe ([place]) 05/31/2016  . Acute renal failure (ARF) ([place]) 05/26/2016  . Dehydration 05/26/2016  . Chronic pain 05/26/2016  . Leukocytosis 05/26/2016  . Decubitus ulcers 05/26/2016  . Wound infection 05/26/2016  . Normocytic anemia 05/26/2016  . Hypertension 05/26/2016  . Paraplegia [facility])    PCP:  Harvie Junior, MD Pharmacy:   [facility] [address] [place] [address] Phone: [phone] Fax: [place]  [facility] [place], [place] - [address] [place] [address] Phone: [phone] Fax: [phone]     Social Determinants of Health (SDOH) Interventions    Readmission Risk Interventions No flowsheet data found.
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
## 2020-11-15 ENCOUNTER — Encounter (HOSPITAL_COMMUNITY): Payer: Self-pay | Admitting: Internal Medicine

## 2020-11-15 ENCOUNTER — Other Ambulatory Visit: Payer: Self-pay

## 2020-11-15 DIAGNOSIS — T50901S Poisoning by unspecified drugs, medicaments and biological substances, accidental (unintentional), sequela: Secondary | ICD-10-CM

## 2020-11-15 DIAGNOSIS — L89154 Pressure ulcer of sacral region, stage 4: Secondary | ICD-10-CM

## 2020-11-15 LAB — URINE CULTURE: Culture: >=100000 — AB

## 2020-11-15 LAB — BASIC METABOLIC PANEL
Anion gap: 10 (ref 5–15)
BUN: 17 mg/dL (ref 6–20)
CO2: 27 mmol/L (ref 22–32)
Calcium: 8.7 mg/dL — ABNORMAL LOW (ref 8.9–10.3)
Chloride: 106 mmol/L (ref 98–111)
Creatinine, Ser: 1.01 mg/dL (ref 0.61–1.24)
GFR, Estimated: >60 mL/min (ref >60)
Glucose, Bld: 96 mg/dL (ref 70–99)
Potassium: 4.9 mmol/L (ref 3.5–5.1)
Sodium: 143 mmol/L (ref 135–145)

## 2020-11-15 LAB — CBC
HCT: 37.3 % — ABNORMAL LOW (ref 39.0–52.0)
Hemoglobin: 11.5 g/dL — ABNORMAL LOW (ref 13.0–17.0)
MCH: 27.8 pg (ref 26.0–34.0)
MCHC: 30.8 g/dL (ref 30.0–36.0)
MCV: 90.1 fL (ref 80.0–100.0)
Platelets: 191 10*3/uL (ref 150–400)
RBC: 4.14 MIL/uL — ABNORMAL LOW (ref 4.22–5.81)
RDW: 15.9 % — ABNORMAL HIGH (ref 11.5–15.5)
WBC: 8.7 10*3/uL (ref 4.0–10.5)
nRBC: 0 % (ref 0.0–0.2)

## 2020-11-15 MED ORDER — INFLUENZA VAC SPLIT QUAD 0.5 ML IM SUSY
0.5000 mL | PREFILLED_SYRINGE | INTRAMUSCULAR | Status: AC
  Administered 2020-11-16: 0.5 mL via INTRAMUSCULAR
  Filled 2020-11-15: qty 0.5

## 2020-11-15 MED ORDER — AMOXICILLIN-POT CLAVULANATE 875-125 MG PO TABS
1.0000 | ORAL_TABLET | Freq: Two times a day (BID) | ORAL | Status: DC
  Administered 2020-11-15 – 2020-11-18 (×7): 1 via ORAL
  Filled 2020-11-15 (×7): qty 1

## 2020-11-15 MED ORDER — LIDOCAINE 5 % EX PTCH
2.0000 | MEDICATED_PATCH | CUTANEOUS | Status: DC
  Administered 2020-11-16 – 2020-11-18 (×3): 2 via TRANSDERMAL
  Filled 2020-11-15 (×4): qty 2

## 2020-11-15 NOTE — Plan of Care (Signed)
  Problem: Education: Goal: Knowledge of General Education information will improve Description: Including pain rating scale, medication(s)/side effects and non-pharmacologic comfort measures Outcome: Progressing   Problem: Health Behavior/Discharge Planning: Goal: Ability to manage health-related needs will improve Outcome: Progressing   Problem: Clinical Measurements: Goal: Ability to maintain clinical measurements within normal limits will improve Outcome: Progressing Goal: Respiratory complications will improve Outcome: Progressing Goal: Cardiovascular complication will be avoided Outcome: Progressing   Problem: Nutrition: Goal: Adequate nutrition will be maintained Outcome: Progressing   Problem: Coping: Goal: Level of anxiety will decrease Outcome: Progressing   Problem: Elimination: Goal: Will not experience complications related to bowel motility Outcome: Progressing Goal: Will not experience complications related to urinary retention Outcome: Progressing   Problem: Pain Managment: Goal: General experience of comfort will improve Outcome: Progressing   Problem: Safety: Goal: Ability to remain free from injury will improve Outcome: Progressing

## 2020-11-15 NOTE — Plan of Care (Signed)

## 2020-11-15 NOTE — Care Management Important Message (Signed)
Important Message  Patient Details  Name: [name] MRN: [id number] Date of Birth: [birth year]   Medicare Important Message Given:  Yes     Eros Montour 11/15/2020, 2:09 PM
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
## 2020-11-16 LAB — CBC
HCT: 35.8 % — ABNORMAL LOW (ref 39.0–52.0)
Hemoglobin: 11.1 g/dL — ABNORMAL LOW (ref 13.0–17.0)
MCH: 27.6 pg (ref 26.0–34.0)
MCHC: 31 g/dL (ref 30.0–36.0)
MCV: 89.1 fL (ref 80.0–100.0)
Platelets: 210 10*3/uL (ref 150–400)
RBC: 4.02 MIL/uL — ABNORMAL LOW (ref 4.22–5.81)
RDW: 16 % — ABNORMAL HIGH (ref 11.5–15.5)
WBC: 10.4 10*3/uL (ref 4.0–10.5)
nRBC: 0 % (ref 0.0–0.2)

## 2020-11-16 LAB — BASIC METABOLIC PANEL
Anion gap: 9 (ref 5–15)
BUN: 16 mg/dL (ref 6–20)
CO2: 26 mmol/L (ref 22–32)
Calcium: 8.8 mg/dL — ABNORMAL LOW (ref 8.9–10.3)
Chloride: 105 mmol/L (ref 98–111)
Creatinine, Ser: 1.12 mg/dL (ref 0.61–1.24)
GFR, Estimated: >60 mL/min (ref >60)
Glucose, Bld: 104 mg/dL — ABNORMAL HIGH (ref 70–99)
Potassium: 4.7 mmol/L (ref 3.5–5.1)
Sodium: 140 mmol/L (ref 135–145)

## 2020-11-16 MED ORDER — FAMOTIDINE 20 MG PO TABS
20.0000 mg | ORAL_TABLET | Freq: Every day | ORAL | Status: DC
  Administered 2020-11-16 – 2020-11-18 (×3): 20 mg via ORAL
  Filled 2020-11-16 (×3): qty 1

## 2020-11-16 NOTE — Plan of Care (Signed)
  Problem: Education: Goal: Knowledge of General Education information will improve Description: Including pain rating scale, medication(s)/side effects and non-pharmacologic comfort measures Outcome: Progressing   Problem: Health Behavior/Discharge Planning: Goal: Ability to manage health-related needs will improve Outcome: Progressing   Problem: Clinical Measurements: Goal: Ability to maintain clinical measurements within normal limits will improve Outcome: Progressing   Problem: Clinical Measurements: Goal: Diagnostic test results will improve Outcome: Progressing   Problem: Nutrition: Goal: Adequate nutrition will be maintained Outcome: Progressing   Problem: Pain Managment: Goal: General experience of comfort will improve Outcome: Progressing   Problem: Skin Integrity: Goal: Risk for impaired skin integrity will decrease Outcome: Progressing

## 2020-11-16 NOTE — TOC Progression Note (Addendum)
Transition of Care [facility]) - Progression Note    Patient Details  Name: [name] MRN: [id number] Date of Birth: 1965/12/01  Transition [facility]) CM/SW Contact  Zenon [place], RN Phone Number: 11/16/2020, 4:14 PM  Clinical Narrative:    NCM spoke with patient, offered choice, he states he does not have a preference for [facility] agency for HHPT.  NCM made referral to [facility] for [facility]. Awaiting call back.  Patient states he will need hoyer lift and shower chair , ok with [employer] supplying, NCM made referral to [facility] for this DME , it will be shipped to his home. Also Zach with [employer] is working on the NIV.  Patient states he will need ambulance transport home and his friend [name] helps him at home. Per [facility] , they are unable to take referral due to staffing. NCM made referral to [facility] with [facility]. Awaiting call back. They are unable to take due to staffing.  NCM made referral to Amy with [facility]. She is able to take referral for HHPT.  Soc will begin 24 to 48 hrs post dc.   Expected Discharge Plan: [place] Barriers to Discharge: Continued Medical Work up  Expected Discharge Plan and Services Expected Discharge Plan: [place] Acute Care Choice: Durable Medical Equipment                   DME Arranged: NIV DME Agency: [employer] Date DME Agency Contacted: 11/14/20 Time DME Agency Contacted: 1330 Representative spoke with at DME Agency: [name]             Social Determinants of Health ([place]) Interventions    Readmission Risk Interventions No flowsheet data found.
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
## 2020-11-17 LAB — CULTURE, BLOOD (ROUTINE X 2)
Culture: NO GROWTH
Special Requests: ADEQUATE

## 2020-11-17 MED ORDER — AMOXICILLIN-POT CLAVULANATE 875-125 MG PO TABS: 1.0000 | ORAL_TABLET | Freq: Two times a day (BID) | ORAL | 0 refills | Status: AC
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
## 2020-11-17 NOTE — Plan of Care (Signed)
  Problem: Education: Goal: Knowledge of General Education information will improve Description: Including pain rating scale, medication(s)/side effects and non-pharmacologic comfort measures Outcome: Progressing   Problem: Clinical Measurements: Goal: Ability to maintain clinical measurements within normal limits will improve Outcome: Progressing   Problem: Clinical Measurements: Goal: Diagnostic test results will improve Outcome: Progressing   Problem: Clinical Measurements: Goal: Cardiovascular complication will be avoided Outcome: Progressing   Problem: Nutrition: Goal: Adequate nutrition will be maintained Outcome: Progressing   Problem: Elimination: Goal: Will not experience complications related to urinary retention Outcome: Progressing   Problem: Pain Managment: Goal: General experience of comfort will improve Outcome: Progressing   Problem: Skin Integrity: Goal: Risk for impaired skin integrity will decrease Outcome: Progressing

## 2020-11-17 NOTE — TOC Transition Note (Signed)
Transition of Care [facility]) - CM/SW Discharge Note   Patient Details  Name: [name] MRN: [id number] Date of Birth: 05-15-1966  Transition of Care [facility]) CM/SW Contact:  Zenon Mayo, RN Phone Number: 11/17/2020, 4:09 PM   Clinical Narrative:    Patient is set up with [facility] for HHPT, we are awaiting auth for the NIV from his insurance company.  Once auth received patient can be discharged.  He will need ambulance transport home. He will also have hoyer lift delivered to his home.      Final next level of care: [place] Barriers to Discharge: Continued Medical Work up   Patient Goals and CMS Choice Patient states their goals for this hospitalization and ongoing recovery are:: get better CMS Medicare.gov Compare Post Acute Care list provided to:: Patient Choice offered to / list presented to : Patient  Discharge Placement                       Discharge Plan and Services     Post Acute Care Choice: Durable Medical Equipment          DME Arranged: NIV,hoyer lift DME Agency: [employer] Date DME Agency Contacted: 11/14/20 Time DME Agency Contacted: 1330 Representative spoke with at DME Agency: [place]: PT [place] Date [place]: 11/17/20 Time [place]: 1608 Representative spoke with at [place]: Amy  Social Determinants of Health ([place]) Interventions     Readmission Risk Interventions No flowsheet data found.

## 2020-11-18 LAB — CULTURE, BLOOD (ROUTINE X 2): Culture: NO GROWTH

## 2020-11-18 NOTE — Progress Notes (Signed)
Per Notice of Denial of Medical Coverage from [name] on November 18, 2020 for member number: [id number], Patient name: [name], NIV therapy cannot be provided in the home at this time.  Patient is appropriate for discharge today and in an effort to support patient's respiratory stability, attempt to prevent hospital readmission, prevent the risk of exacerbation, and manage this patient's hypercapnia due to morbid obesity with thoracic restriction, we will order BiLevel in hopes to prevent the decline in patient's health status.  Discharging without oxygen would is unsafe for the patient at this time.    Order to place:  Bipap S and supplies  8/4 with 5 LPM O2 bled in

## 2020-11-18 NOTE — TOC Transition Note (Signed)
Transition of Care [facility]) - CM/SW Discharge Note   Patient Details  Name: [name] MRN: [id number] Date of Birth: 1966/09/06  Transition of Care [facility]) CM/SW Contact:  Zenon Mayo, RN Phone Number: 11/18/2020, 4:49 PM   Clinical Narrative:    Patient is for dc today, the hoyer lift has been delivered to his home, with the shower chair , he will have the bipap delivered here to the hospital. Per [name] [employer] rep the resp therapist will be here at 6:30 to meet with patient and go over the bipap.  NCM has scheduled ptar transport , they state it will be a couple of hours maybe around 9 pm tonight for transport.   Final next level of care: [place] Barriers to Discharge: No Barriers Identified   Patient Goals and CMS Choice Patient states their goals for this hospitalization and ongoing recovery are:: get better CMS Medicare.gov Compare Post Acute Care list provided to:: Patient Choice offered to / list presented to : Patient  Discharge Placement                       Discharge Plan and Services     Post Acute Care Choice: Durable Medical Equipment          DME Arranged: Bipap,Shower stool (hoyer lift) DME Agency: [employer] Date DME Agency Contacted: 11/14/20 Time DME Agency Contacted: 1330 Representative spoke with at DME Agency: [place]: PT [place] Date [place]: 11/17/20 Time [place]: 1608 Representative spoke with at [place]: Amy  Social Determinants of Health ([place]) Interventions     Readmission Risk Interventions No flowsheet data found.

## 2020-11-18 NOTE — Progress Notes (Signed)
Patient remains stable for discharge today pending Trilogy / BiPAP, apparently his insurance company [facility]) denied coverage despite having a medical indication. Working to get a BiPAP per case management  Baudelio Karnes M. Cruzita Lederer, MD, PhD [facility]  Between 7 am - 7 pm you can contact me via [place] or [place].  I am not available 7 pm - 7 am, please contact night coverage MD/APP via Am[place]

## 2020-11-18 NOTE — Plan of Care (Signed)

## 2020-11-18 NOTE — Plan of Care (Signed)
  Problem: Education: Goal: Knowledge of General Education information will improve Description: Including pain rating scale, medication(s)/side effects and non-pharmacologic comfort measures 11/18/2020 1308 by Heriberto Antigua D, RN Outcome: Progressing 11/18/2020 1307 by Burt Knack, Rhapsody Wolven D, RN Outcome: Progressing   Problem: Health Behavior/Discharge Planning: Goal: Ability to manage health-related needs will improve 11/18/2020 1308 by Burt Knack, Tehilla Coffel D, RN Outcome: Progressing 11/18/2020 1307 by Burt Knack, Raymar Joiner D, RN Outcome: Progressing   Problem: Clinical Measurements: Goal: Ability to maintain clinical measurements within normal limits will improve 11/18/2020 1308 by Burt Knack, Adel Neyer D, RN Outcome: Progressing 11/18/2020 1307 by Burt Knack, Naela Nodal D, RN Outcome: Progressing Goal: Will remain free from infection 11/18/2020 1308 by Burt Knack, Falecia Vannatter D, RN Outcome: Progressing 11/18/2020 1307 by Burt Knack, Annalysia Willenbring D, RN Outcome: Progressing Goal: Diagnostic test results will improve 11/18/2020 1308 by Heriberto Antigua D, RN Outcome: Progressing 11/18/2020 1307 by Burt Knack, Jeramy Dimmick D, RN Outcome: Progressing Goal: Respiratory complications will improve 11/18/2020 1308 by Heriberto Antigua D, RN Outcome: Progressing 11/18/2020 1307 by Burt Knack, Jihan Rudy D, RN Outcome: Progressing Goal: Cardiovascular complication will be avoided 11/18/2020 1308 by Heriberto Antigua D, RN Outcome: Progressing 11/18/2020 1307 by Burt Knack, Isabela Nardelli D, RN Outcome: Progressing   Problem: Activity: Goal: Risk for activity intolerance will decrease 11/18/2020 1308 by Heriberto Antigua D, RN Outcome: Progressing 11/18/2020 1307 by Burt Knack, Khing Belcher D, RN Outcome: Progressing   Problem: Nutrition: Goal: Adequate nutrition will be maintained 11/18/2020 1308 by Heriberto Antigua D, RN Outcome: Progressing 11/18/2020 1307 by Burt Knack, Yovany Clock D, RN Outcome: Progressing   Problem: Coping: Goal: Level of anxiety will decrease 11/18/2020 1308 by Heriberto Antigua D, RN Outcome:  Progressing 11/18/2020 1307 by Burt Knack, Joscelyne Renville D, RN Outcome: Progressing   Problem: Elimination: Goal: Will not experience complications related to bowel motility 11/18/2020 1308 by Heriberto Antigua D, RN Outcome: Progressing 11/18/2020 1307 by Burt Knack, Robbin Escher D, RN Outcome: Progressing Goal: Will not experience complications related to urinary retention 11/18/2020 1308 by Heriberto Antigua D, RN Outcome: Progressing 11/18/2020 1307 by Burt Knack, Jurney Overacker D, RN Outcome: Progressing   Problem: Pain Managment: Goal: General experience of comfort will improve 11/18/2020 1308 by Burt Knack, Timmie Calix D, RN Outcome: Progressing 11/18/2020 1307 by Burt Knack, Chryl Holten D, RN Outcome: Progressing   Problem: Safety: Goal: Ability to remain free from injury will improve 11/18/2020 1308 by Burt Knack, Laurie Lovejoy D, RN Outcome: Progressing 11/18/2020 1307 by Burt Knack, Maxamilian Amadon D, RN Outcome: Progressing   Problem: Skin Integrity: Goal: Risk for impaired skin integrity will decrease 11/18/2020 1308 by Heriberto Antigua D, RN Outcome: Progressing 11/18/2020 1307 by Burt Knack, Blayke Pinera D, RN Outcome: Progressing

## 2020-11-18 NOTE — Plan of Care (Signed)
  Problem: Education: Goal: Knowledge of General Education information will improve Description: Including pain rating scale, medication(s)/side effects and non-pharmacologic comfort measures 11/18/2020 2008 by Lubertha South, RN Outcome: Adequate for Discharge   Problem: Health Behavior/Discharge Planning: Goal: Ability to manage health-related needs will improve 11/18/2020 2008 by Lubertha South, RN Outcome: Adequate for Discharge   Problem: Clinical Measurements: Goal: Ability to maintain clinical measurements within normal limits will improve 11/18/2020 2008 by Lubertha South, RN Outcome: Adequate for Discharge   Problem: Clinical Measurements: Goal: Will remain free from infection 11/18/2020 2008 by Lubertha South, RN Outcome: Adequate for Discharge   Problem: Nutrition: Goal: Adequate nutrition will be maintained 11/18/2020 2008 by Lubertha South, RN Outcome: Adequate for Discharge   Problem: Elimination: Goal: Will not experience complications related to bowel motility 11/18/2020 2008 by Lubertha South, RN Outcome: Adequate for Discharge   Problem: Pain Managment: Goal: General experience of comfort will improve 11/18/2020 2008 by Lubertha South, RN Outcome: Adequate for Discharge   Problem: Skin Integrity: Goal: Risk for impaired skin integrity will decrease 11/18/2020 2008 by Lubertha South, RN Outcome: Adequate for Discharge

## 2020-11-23 NOTE — TOC Progression Note (Addendum)
Transition of Care [facility]) - Progression Note    Patient Details  Name: [name] MRN: [id number] Date of Birth: 1966/10/01  Transition of Care [facility]) CM/SW Contact  Zenon Mayo, RN Phone Number: 11/23/2020, 1:37 PM  Clinical Narrative:    NCM received information from Amy with [facility] that she spoke with patient this am and he told her that his PCP is no longer going to service him due to his insurance, he has no idea who he'll go to now.  NCM made a hospital follow up for him at the [facility] clinic on 3/28 at 9:30 for a virtual  Follow up with Geryl Rankins, since he is paraplegic.  Amy states she will give the patient this information.   Expected Discharge Plan: [place] Barriers to Discharge: No Barriers Identified  Expected Discharge Plan and Services Expected Discharge Plan: [place] Choice: Durable Medical Equipment   Expected Discharge Date: 11/18/20               DME Arranged: Bipap,Shower stool (hoyer lift) DME Agency: [employer] Date DME Agency Contacted: 11/14/20 Time DME Agency Contacted: 1330 Representative spoke with at DME Agency: [place]: PT [address] Date [place]: 11/17/20 Time [place]: [id number] Representative spoke with at [place]: Amy   Social Determinants of Health ([place]) Interventions    Readmission Risk Interventions No flowsheet data found.

## 2020-11-24 ENCOUNTER — Telehealth: Payer: Self-pay

## 2020-11-24 NOTE — Telephone Encounter (Signed)
Copied from Center [id number]. Topic: General - Inquiry >> Nov 23, 2020  3:09 PM Greggory Keen D wrote: Reason for CRM: Santiago Glad with [facility] called asking if Dr Raul Del would sign home health and pt services before she actually sees him as a patient.  He has an appt in March  CB#  [phone]  Patient has a HFU in March. Please advise.

## 2020-11-24 NOTE — Telephone Encounter (Signed)
S/W Juliann Pulse. They will attempt to contact the MD who initially ordered [facility] upon patient discharge from hospital

## 2020-12-01 ENCOUNTER — Encounter (HOSPITAL_BASED_OUTPATIENT_CLINIC_OR_DEPARTMENT_OTHER): Payer: Medicare Other | Admitting: Physician Assistant

## 2020-12-27 ENCOUNTER — Other Ambulatory Visit: Payer: Self-pay

## 2020-12-27 ENCOUNTER — Ambulatory Visit: Payer: Medicare Other | Attending: Nurse Practitioner | Admitting: Nurse Practitioner

## 2020-12-27 ENCOUNTER — Encounter: Payer: Self-pay | Admitting: Nurse Practitioner

## 2020-12-27 DIAGNOSIS — I10 Essential (primary) hypertension: Secondary | ICD-10-CM

## 2020-12-27 DIAGNOSIS — N390 Urinary tract infection, site not specified: Secondary | ICD-10-CM

## 2020-12-27 DIAGNOSIS — F1994 Other psychoactive substance use, unspecified with psychoactive substance-induced mood disorder: Secondary | ICD-10-CM

## 2020-12-27 DIAGNOSIS — Z7689 Persons encountering health services in other specified circumstances: Secondary | ICD-10-CM | POA: Diagnosis not present

## 2020-12-27 DIAGNOSIS — K219 Gastro-esophageal reflux disease without esophagitis: Secondary | ICD-10-CM

## 2020-12-27 DIAGNOSIS — G8220 Paraplegia, unspecified: Secondary | ICD-10-CM

## 2020-12-27 MED ORDER — PANTOPRAZOLE SODIUM 20 MG PO TBEC: 20.0000 mg | DELAYED_RELEASE_TABLET | Freq: Every morning | ORAL | 0 refills | Status: DC

## 2020-12-27 MED ORDER — BLOOD PRESSURE MONITOR DEVI: 0 refills | Status: DC

## 2020-12-27 MED ORDER — FAMOTIDINE 40 MG PO TABS: 40.0000 mg | ORAL_TABLET | Freq: Two times a day (BID) | ORAL | 3 refills | Status: DC

## 2020-12-27 MED ORDER — OLMESARTAN-AMLODIPINE-HCTZ 40-10-25 MG PO TABS: 1.0000 | ORAL_TABLET | Freq: Every day | ORAL | 0 refills | Status: DC

## 2020-12-27 MED ORDER — BAYER LOW DOSE 81 MG PO TBEC
162.0000 mg | DELAYED_RELEASE_TABLET | Freq: Every morning | ORAL | 1 refills | Status: AC
Start: 2020-12-27 — End: 2021-03-27
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
## 2020-12-29 ENCOUNTER — Other Ambulatory Visit: Payer: Self-pay

## 2020-12-29 ENCOUNTER — Encounter (HOSPITAL_BASED_OUTPATIENT_CLINIC_OR_DEPARTMENT_OTHER): Payer: Medicare Other | Attending: Physician Assistant | Admitting: Physician Assistant

## 2020-12-29 DIAGNOSIS — Z933 Colostomy status: Secondary | ICD-10-CM | POA: Insufficient documentation

## 2020-12-29 DIAGNOSIS — F17210 Nicotine dependence, cigarettes, uncomplicated: Secondary | ICD-10-CM | POA: Diagnosis not present

## 2020-12-29 DIAGNOSIS — G8222 Paraplegia, incomplete: Secondary | ICD-10-CM | POA: Insufficient documentation

## 2020-12-29 DIAGNOSIS — L89324 Pressure ulcer of left buttock, stage 4: Secondary | ICD-10-CM | POA: Diagnosis present

## 2020-12-29 DIAGNOSIS — L89154 Pressure ulcer of sacral region, stage 4: Secondary | ICD-10-CM | POA: Diagnosis not present

## 2020-12-29 DIAGNOSIS — L89892 Pressure ulcer of other site, stage 2: Secondary | ICD-10-CM | POA: Diagnosis not present

## 2020-12-29 DIAGNOSIS — L89893 Pressure ulcer of other site, stage 3: Secondary | ICD-10-CM | POA: Insufficient documentation

## 2020-12-29 DIAGNOSIS — L89314 Pressure ulcer of right buttock, stage 4: Secondary | ICD-10-CM | POA: Diagnosis not present
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
## 2020-12-30 NOTE — Progress Notes (Signed)
[name], [name] ([id number]) Visit Report for 12/29/2020 Arrival Information Details Patient Name: Date of Service: [name] 12/29/2020 8:45 A M Medical Record Number: [id number] Patient Account Number: [id number] Date of Birth/Sex: Treating RN: 05/03/1966 (55 y.o. [name] Primary Care [name]: Geryl Rankins Other Clinician: Referring Berenice Oehlert: Treating [name] Alaniz/Extender: [name] in [name]: 45 Visit Information History Since Last Visit Added or deleted any medications: No Patient Arrived: Wheel Chair Any new allergies or adverse reactions: No Arrival Time: 08:33 Had a fall or experienced change in No Accompanied By: alone activities of daily living that may affect Transfer Assistance: None risk of falls: Patient Requires Transmission-Based Precautions: No Signs or symptoms of abuse/neglect since last visito No Patient Has Alerts: No Hospitalized since last visit: Yes Implantable device outside of the clinic excluding No cellular tissue based products placed in the center since last visit: Has Dressing in Place as Prescribed: Yes Pain Present Now: Yes Electronic Signature(s) Signed: 12/30/2020 5:46:28 PM By: Levan Hurst RN, BSN Entered By: Levan Hurst on 12/29/2020 08:33:48 -------------------------------------------------------------------------------- Clinic Level of Care Assessment Details Patient Name: Date of Service: [name] RA [name] 12/29/2020 8:45 A M Medical Record Number: [id number] Patient Account Number: [id number] Date of Birth/Sex: Treating RN: 09-21-1966 (55 y.o. [name] Primary Care [name]: Geryl Rankins Other Clinician: Referring [name]: Treating [name]: [name] Level of Care Assessment Items TOOL 4 Quantity Score []  - 0 Use when only an EandM is performed on FOLLOW-UP visit ASSESSMENTS - Nursing Assessment /  Reassessment X- 1 10 Reassessment of Co-morbidities (includes updates in patient status) X- 1 5 Reassessment of Adherence to [name] Plan ASSESSMENTS - Wound and Skin A ssessment / Reassessment []  - 0 Simple Wound Assessment / Reassessment - one wound X- 4 5 Complex Wound Assessment / Reassessment - multiple wounds []  - 0 Dermatologic / Skin Assessment (not related to wound area) ASSESSMENTS - Focused Assessment []  - 0 Circumferential Edema Measurements - multi extremities []  - 0 Nutritional Assessment / Counseling / Intervention []  - 0 Lower Extremity Assessment (monofilament, tuning fork, pulses) []  - 0 Peripheral Arterial Disease Assessment (using hand held doppler) ASSESSMENTS - Ostomy and/or Continence Assessment and Care []  - 0 Incontinence Assessment and Management []  - 0 Ostomy Care Assessment and Management (repouching, etc.) PROCESS - Coordination of Care X - Simple Patient / Family Education for ongoing care 1 15 []  - 0 Complex (extensive) Patient / Family Education for ongoing care X- 1 10 Staff obtains Programmer, systems, Records, T Results / Process Orders est []  - 0 Staff telephones HHA, Nursing Homes / Clarify orders / etc []  - 0 Routine Transfer to another Facility (non-emergent condition) []  - 0 Routine Hospital Admission (non-emergent condition) []  - 0 New Admissions / Biomedical engineer / Ordering NPWT Apligraf, etc. , []  - 0 Emergency Hospital Admission (emergent condition) X- 1 10 Simple Discharge Coordination []  - 0 Complex (extensive) Discharge Coordination PROCESS - Special Needs []  - 0 Pediatric / Minor Patient Management []  - 0 Isolation Patient Management []  - 0 Hearing / Language / Visual special needs []  - 0 Assessment of Community assistance (transportation, D/C planning, etc.) []  - 0 Additional assistance / Altered mentation []  - 0 Support Surface(s) Assessment (bed, cushion, seat, etc.) INTERVENTIONS - Wound Cleansing /  Measurement []  - 0 Simple Wound Cleansing - one wound X- 4 5 Complex Wound Cleansing - multiple wounds X- 1 5 Wound Imaging (  photographs - any number of wounds) []  - 0 Wound Tracing (instead of photographs) []  - 0 Simple Wound Measurement - one wound X- 4 5 Complex Wound Measurement - multiple wounds INTERVENTIONS - Wound Dressings X - Small Wound Dressing one or multiple wounds 3 10 []  - 0 Medium Wound Dressing one or multiple wounds []  - 0 Large Wound Dressing one or multiple wounds X- 1 5 Application of Medications - topical []  - 0 Application of Medications - injection INTERVENTIONS - Miscellaneous []  - 0 External ear exam []  - 0 Specimen Collection (cultures, biopsies, blood, body fluids, etc.) []  - 0 Specimen(s) / Culture(s) sent or taken to Lab for analysis []  - 0 Patient Transfer (multiple staff / Civil Service fast streamer / Similar devices) []  - 0 Simple Staple / Suture removal (25 or less) []  - 0 Complex Staple / Suture removal (26 or more) []  - 0 Hypo / Hyperglycemic Management (close monitor of Blood Glucose) []  - 0 Ankle / Brachial Index (ABI) - do not check if billed separately X- 1 5 Vital Signs Has the patient been seen at the hospital within the last three years: Yes Total Score: 155 Level Of Care: New/Established - Level 4 Electronic Signature(s) Signed: 12/29/2020 5:50:20 PM By: Baruch Gouty RN, BSN Entered By: Baruch Gouty on 12/29/2020 09:27:42 -------------------------------------------------------------------------------- Encounter Discharge Information Details Patient Name: Date of Service: [name] RA [name] 12/29/2020 8:45 A M Medical Record Number: [id number] Patient Account Number: [id number] Date of Birth/Sex: Treating RN: 07/21/1966 (55 y.o. [name] Primary Care Kyiah Canepa: Geryl Rankins Other Clinician: Referring Danyiel Crespin: Treating [name]/Extender: [name] in [name]: [phone] Encounter Discharge  Information Items Discharge Condition: Stable Ambulatory Status: Wheelchair Discharge Destination: Home Transportation: Private Auto Accompanied By: Mady Gemma Schedule Follow-up Appointment: Yes Clinical Summary of Care: Patient Declined Electronic Signature(s) Signed: 12/29/2020 5:43:14 PM By: Rhae Hammock RN Entered By: Rhae Hammock on 12/29/2020 09:36:59 -------------------------------------------------------------------------------- [place] Details Patient Name: Date of Service: [name] RA [place], DA [name] 12/29/2020 8:45 A M Medical Record Number: [id number] Patient Account Number: [id number] Date of Birth/Sex: Treating RN: Apr 15, 1966 (55 y.o. [name] Primary Care Yovani Cogburn: Geryl Rankins Other Clinician: Referring Sharonna Vinje: Treating Sharesa Kemp/Extender: [name] in [name]: [place] reviewed with physician Active Inactive Pressure Nursing Diagnoses: Knowledge deficit related to causes and risk factors for pressure ulcer development Knowledge deficit related to management of pressures ulcers Potential for impaired tissue integrity related to pressure, friction, moisture, and shear Goals: Patient will remain free from development of additional pressure ulcers Date Initiated: 12/12/2017 Date Inactivated: 08/14/2018 Target Resolution Date: 08/21/2018 Goal Status: Unmet Unmet Reason: hospitalized Patient will remain free of pressure ulcers Date Initiated: 12/12/2017 Date Inactivated: 02/06/2018 Target Resolution Date: 02/06/2018 Unmet Reason: pt noncompliant wth Goal Status: Unmet offloading Patient/caregiver will verbalize understanding of pressure ulcer management Date Initiated: 12/12/2017 Target Resolution Date: 01/26/2021 Goal Status: Active Interventions: Assess: immobility, friction, shearing, incontinence upon admission and as needed Assess offloading mechanisms upon admission and as  needed Assess potential for pressure ulcer upon admission and as needed Notes: Wound/Skin Impairment Nursing Diagnoses: Impaired tissue integrity Goals: Patient/caregiver will verbalize understanding of skin care regimen Date Initiated: 03/06/2018 Target Resolution Date: 01/26/2021 Goal Status: Active Ulcer/skin breakdown will heal within 14 weeks Date Initiated: 07/31/2017 Date Inactivated: 01/09/2018 Target Resolution Date: 11/30/2017 Unmet Reason: noncompliant with Goal Status: Unmet offloading Interventions: Assess patient/caregiver ability to perform ulcer/skin care regimen upon admission and as needed Notes: Electronic  Signature(s) Signed: 12/29/2020 5:50:20 PM By: Baruch Gouty RN, BSN Entered By: Baruch Gouty on 12/29/2020 09:21:57 -------------------------------------------------------------------------------- Pain Assessment Details Patient Name: Date of Service: [name] RA [place], DA [name] 12/29/2020 8:45 A M Medical Record Number: [id number] Patient Account Number: [id number] Date of Birth/Sex: Treating RN: 07-Dec-1965 (55 y.o. [name] Primary Care Deshawn Skelley: Geryl Rankins Other Clinician: Referring Gilberto Stanforth: Treating Dunia Pringle/Extender: Seymour Bars Weeks in [name]: [phone] Active Problems Location of Pain Severity and Description of Pain Patient Has Paino Yes Site Locations Pain Location: Pain Location: Generalized Pain With Dressing Change: Yes Duration of the Pain. Constant / Intermittento Constant Rate the pain. Current Pain Level: 8 Character of Pain Describe the Pain: Aching, Throbbing Pain Management and Medication Current Pain Management: Medication: Yes Cold Application: No Rest: No Massage: No Activity: No T.E.N.S.: No Heat Application: No Leg drop or elevation: No Is the Current Pain Management Adequate: Adequate How does your wound impact your activities of daily livingo Sleep: No Bathing: No Appetite:  No Relationship With Others: No Bladder Continence: No Emotions: No Bowel Continence: No Work: No Toileting: No Drive: No Dressing: No Hobbies: No Electronic Signature(s) Signed: 12/30/2020 5:46:28 PM By: Levan Hurst RN, BSN Entered By: Levan Hurst on 12/29/2020 08:41:09 -------------------------------------------------------------------------------- Patient/Caregiver Education Details Patient Name: Date of Service: [name] RA [name], DA [name] 3/30/2022andnbsp8:45 A M Medical Record Number: [id number] Patient Account Number: [id number] Date of Birth/Gender: Treating RN: [birth year] (55 y.o. [name] Primary Care Physician: Geryl Rankins Other Clinician: Referring Physician: Treating Physician/Extender: [name] in [name]: [phone] Education Assessment Education Provided To: Patient Education Topics Provided Pressure: Methods: Explain/Verbal Responses: Reinforcements needed, State content correctly Wound/Skin Impairment: Methods: Explain/Verbal Responses: Reinforcements needed, State content correctly Electronic Signature(s) Signed: 12/29/2020 5:50:20 PM By: Baruch Gouty RN, BSN Entered By: Baruch Gouty on 12/29/2020 09:22:56 -------------------------------------------------------------------------------- Wound Assessment Details Patient Name: Date of Service: [name] RA [place], DA [name] 12/29/2020 8:45 A M Medical Record Number: [id number] Patient Account Number: [id number] Date of Birth/Sex: Treating RN: [birth year] (55 y.o. [name] Primary Care Meshulem Onorato: Geryl Rankins Other Clinician: Referring Hydeia Mcatee: Treating Dewaine Morocho/Extender: Seymour Bars Weeks in [name]: 178 Wound Status Wound Number: 19 Primary Etiology: Pressure Ulcer Wound Location: Sacrum Wound Status: Open Wounding Event: Pressure Injury Comorbid History: Anemia, Hypertension, Colitis, Osteomyelitis, Paraplegia Date Acquired:  10/02/2013 Weeks Of [name]: 178 Clustered Wound: No Photos Wound Measurements Length: (cm) 0.8 Width: (cm) 0.8 Depth: (cm) 0.5 Area: (cm) 0.503 Volume: (cm) 0.251 % Reduction in Area: 76.5% % Reduction in Volume: 91% Epithelialization: Medium (34-66%) Tunneling: No Undermining: No Wound Description Classification: Category/Stage IV Wound Margin: Fibrotic scar, thickened scar Exudate Amount: Medium Exudate Type: Serosanguineous Exudate Color: red, brown Foul Odor After Cleansing: No Slough/Fibrino No Wound Bed Granulation Amount: Large (67-100%) Exposed Structure Granulation Quality: Pink, Pale Fascia Exposed: No Necrotic Amount: None Present (0%) Fat Layer (Subcutaneous Tissue) Exposed: Yes Tendon Exposed: No Muscle Exposed: No Joint Exposed: No Bone Exposed: No [name] Notes Wound #19 (Sacrum) Cleanser Peri-Wound Care Topical Primary Dressing KerraCel Ag Gelling Fiber Dressing, 4x5 in (silver alginate) Discharge Instruction: Apply silver alginate to wound bed, do not pack, just tuck lightly into wounds as instructed Secondary Dressing ABD Pad, 8x10 Discharge Instruction: Apply over primary dressing as directed. Secured With 59M Medipore H Soft Cloth Surgical T 4 x 2 (in/yd) ape Discharge Instruction: Secure dressing with tape as directed. Compression Wrap Compression Stockings Add-Ons Electronic Signature(s) Signed: 12/29/2020 5:25:01 PM By: Sandre Kitty Signed:  12/30/2020 5:46:28 PM By: Levan Hurst RN, BSN Entered By: Sandre Kitty on 12/29/2020 16:39:48 -------------------------------------------------------------------------------- Wound Assessment Details Patient Name: Date of Service: [name] RA [place], DA [name] 12/29/2020 8:45 A M Medical Record Number: [id number] Patient Account Number: [id number] Date of Birth/Sex: Treating RN: 08-01-1966 (55 y.o. [name] Primary Care Kathelyn Gombos: Geryl Rankins Other Clinician: Referring  Verdine Grenfell: Treating Wilbert Hayashi/Extender: Seymour Bars Weeks in [name]: 178 Wound Status Wound Number: 21 Primary Etiology: Pressure Ulcer Wound Location: Right, Medial Ischium Wound Status: Open Wounding Event: Gradually Appeared Comorbid History: Anemia, Hypertension, Colitis, Osteomyelitis, Paraplegia Date Acquired: 10/02/2012 Weeks Of [name]: 143 Clustered Wound: No Photos Wound Measurements Length: (cm) 0.2 Width: (cm) 0.5 Depth: (cm) 2 Area: (cm) 0.079 Volume: (cm) 0.157 % Reduction in Area: 97.2% % Reduction in Volume: 97.2% Epithelialization: Medium (34-66%) Tunneling: No Undermining: No Wound Description Classification: Category/Stage II Wound Margin: Well defined, not attached Exudate Amount: Small Exudate Type: Serosanguineous Exudate Color: red, brown Foul Odor After Cleansing: No Slough/Fibrino No Wound Bed Granulation Amount: Large (67-100%) Exposed Structure Granulation Quality: Pink, Pale Fascia Exposed: No Necrotic Amount: None Present (0%) Fat Layer (Subcutaneous Tissue) Exposed: Yes Tendon Exposed: No Muscle Exposed: No Joint Exposed: No Bone Exposed: No [name] Notes Wound #21 (Ischium) Wound Laterality: Right, Medial Cleanser Peri-Wound Care Topical Primary Dressing KerraCel Ag Gelling Fiber Dressing, 4x5 in (silver alginate) Discharge Instruction: Apply silver alginate to wound bed, do not pack, just tuck lightly into wounds as instructed Secondary Dressing ABD Pad, 8x10 Discharge Instruction: Apply over primary dressing as directed. Secured With 46M Medipore H Soft Cloth Surgical T 4 x 2 (in/yd) ape Discharge Instruction: Secure dressing with tape as directed. Compression Wrap Compression Stockings Add-Ons Electronic Signature(s) Signed: 12/29/2020 5:25:01 PM By: Sandre Kitty Signed: 12/30/2020 5:46:28 PM By: Levan Hurst RN, BSN Entered By: Sandre Kitty on 12/29/2020  16:39:10 -------------------------------------------------------------------------------- Wound Assessment Details Patient Name: Date of Service: [name] RA [place], DA [name] 12/29/2020 8:45 A M Medical Record Number: [id number] Patient Account Number: [id number] Date of Birth/Sex: Treating RN: 1966/09/08 (55 y.o. [name] Primary Care Alakai Macbride: Geryl Rankins Other Clinician: Referring Yorel Redder: Treating Pollie Poma/Extender: Seymour Bars Weeks in [name]: 178 Wound Status Wound Number: 22 Primary Etiology: Pressure Ulcer Wound Location: Left Ischium Wound Status: Open Wounding Event: Gradually Appeared Comorbid History: Anemia, Hypertension, Colitis, Osteomyelitis, Paraplegia Date Acquired: 10/02/2013 Weeks Of [name]: 143 Clustered Wound: No Photos Wound Measurements Length: (cm) 2 Width: (cm) 0.5 Depth: (cm) 4 Area: (cm) 0.785 Volume: (cm) 3.142 % Reduction in Area: -85.1% % Reduction in Volume: -164.5% Epithelialization: Small (1-33%) Tunneling: No Undermining: No Wound Description Classification: Category/Stage II Wound Margin: Epibole Exudate Amount: Medium Exudate Type: Serosanguineous Exudate Color: red, brown Foul Odor After Cleansing: No Slough/Fibrino No Wound Bed Granulation Amount: Large (67-100%) Exposed Structure Granulation Quality: Pink, Pale Fascia Exposed: No Necrotic Amount: None Present (0%) Fat Layer (Subcutaneous Tissue) Exposed: Yes Tendon Exposed: No Muscle Exposed: No Joint Exposed: No Bone Exposed: No [name] Notes Wound #22 (Ischium) Wound Laterality: Left Cleanser Peri-Wound Care Topical Primary Dressing KerraCel Ag Gelling Fiber Dressing, 4x5 in (silver alginate) Discharge Instruction: Apply silver alginate to wound bed, do not pack, just tuck lightly into wounds as instructed Secondary Dressing ABD Pad, 8x10 Discharge Instruction: Apply over primary dressing as directed. Secured With 46M Medipore H  Soft Cloth Surgical T 4 x 2 (in/yd) ape Discharge Instruction: Secure dressing with tape as directed. Compression Wrap Compression Stockings Add-Ons Electronic Signature(s) Signed: 12/29/2020 5:25:01 PM  BySandre Kitty Signed: 12/30/2020 5:46:28 PM By: Levan Hurst RN, BSN Entered By: Sandre Kitty on 12/29/2020 16:38:33 -------------------------------------------------------------------------------- Wound Assessment Details Patient Name: Date of Service: [name] RA [place], DA [name] 12/29/2020 8:45 A M Medical Record Number: [id number] Patient Account Number: [id number] Date of Birth/Sex: Treating RN: 1966-04-26 (55 y.o. [name] Primary Care Leiann Sporer: Geryl Rankins Other Clinician: Referring Alma Muegge: Treating Leshay Desaulniers/Extender: Seymour Bars Weeks in [name]: 178 Wound Status Wound Number: 23 Primary Etiology: Pressure Ulcer Wound Location: Right Gluteus Wound Status: Open Wounding Event: Gradually Appeared Comorbid History: Anemia, Hypertension, Colitis, Osteomyelitis, Paraplegia Date Acquired: 09/18/2018 Weeks Of [name]: 116 Clustered Wound: No Wound Measurements Length: (cm) Width: (cm) Depth: (cm) Area: (cm) Volume: (cm) 0 % Reduction in Area: 100% 0 % Reduction in Volume: 100% 0 Epithelialization: Large (67-100%) 0 Tunneling: No 0 Undermining: No Wound Description Classification: Category/Stage II Wound Margin: Well defined, not attached Exudate Amount: None Present Foul Odor After Cleansing: No Slough/Fibrino No Wound Bed Granulation Amount: None Present (0%) Exposed Structure Necrotic Amount: None Present (0%) Fascia Exposed: No Fat Layer (Subcutaneous Tissue) Exposed: No Tendon Exposed: No Muscle Exposed: No Joint Exposed: No Bone Exposed: No Electronic Signature(s) Signed: 12/30/2020 5:46:28 PM By: Levan Hurst RN, BSN Entered By: Levan Hurst on 12/29/2020  08:55:50 -------------------------------------------------------------------------------- [place] Details Patient Name: Date of Service: [name] RA [name], DA [name] 12/29/2020 8:45 A M Medical Record Number: [id number] Patient Account Number: [id number] Date of Birth/Sex: Treating RN: [birth year] (55 y.o. [name] Primary Care Akhil Piscopo: Geryl Rankins Other Clinician: Referring Tonetta Napoles: Treating Genita Nilsson/Extender: Seymour Bars Weeks in [name]: 178 Vital Signs Time Taken: 08:40 Temperature (F): 98.3 Height (in): 72 Pulse (bpm): 80 Weight (lbs): 215 Respiratory Rate (breaths/min): 20 Body Mass Index (BMI): 29.2 Blood Pressure (mmHg): 163/83 Reference Range: 80 - 120 mg / dl Electronic Signature(s) Signed: 12/30/2020 5:46:28 PM By: Levan Hurst RN, BSN Entered By: Levan Hurst on 12/29/2020 08:40:51

## 2021-02-15 ENCOUNTER — Emergency Department (HOSPITAL_COMMUNITY): Payer: Medicare Other

## 2021-02-15 ENCOUNTER — Encounter (HOSPITAL_COMMUNITY): Payer: Self-pay | Admitting: Emergency Medicine

## 2021-02-15 ENCOUNTER — Observation Stay (HOSPITAL_COMMUNITY)
Admission: EM | Admit: 2021-02-15 | Discharge: 2021-02-16 | Disposition: A | Discharge status: Home / Self Care | Payer: Medicare Other | Attending: Infectious Diseases | Admitting: Infectious Diseases

## 2021-02-15 ENCOUNTER — Other Ambulatory Visit: Payer: Self-pay

## 2021-02-15 DIAGNOSIS — Z20822 Contact with and (suspected) exposure to covid-19: Secondary | ICD-10-CM | POA: Diagnosis not present

## 2021-02-15 DIAGNOSIS — I10 Essential (primary) hypertension: Secondary | ICD-10-CM | POA: Diagnosis not present

## 2021-02-15 DIAGNOSIS — E875 Hyperkalemia: Secondary | ICD-10-CM | POA: Diagnosis not present

## 2021-02-15 DIAGNOSIS — R0600 Dyspnea, unspecified: Secondary | ICD-10-CM | POA: Insufficient documentation

## 2021-02-15 DIAGNOSIS — F1910 Other psychoactive substance abuse, uncomplicated: Secondary | ICD-10-CM | POA: Insufficient documentation

## 2021-02-15 DIAGNOSIS — R0602 Shortness of breath: Secondary | ICD-10-CM | POA: Diagnosis present

## 2021-02-15 DIAGNOSIS — J9620 Acute and chronic respiratory failure, unspecified whether with hypoxia or hypercapnia: Principal | ICD-10-CM

## 2021-02-15 DIAGNOSIS — F17210 Nicotine dependence, cigarettes, uncomplicated: Secondary | ICD-10-CM | POA: Insufficient documentation

## 2021-02-15 DIAGNOSIS — N179 Acute kidney failure, unspecified: Secondary | ICD-10-CM | POA: Diagnosis not present

## 2021-02-15 DIAGNOSIS — Z79899 Other long term (current) drug therapy: Secondary | ICD-10-CM | POA: Diagnosis not present

## 2021-02-15 LAB — CBC WITH DIFFERENTIAL/PLATELET
Abs Immature Granulocytes: 0.07 10*3/uL (ref 0.00–0.07)
Basophils Absolute: 0 10*3/uL (ref 0.0–0.1)
Basophils Relative: 0 %
Eosinophils Absolute: 0.1 10*3/uL (ref 0.0–0.5)
Eosinophils Relative: 1 %
HCT: 33.8 % — ABNORMAL LOW (ref 39.0–52.0)
Hemoglobin: 9.7 g/dL — ABNORMAL LOW (ref 13.0–17.0)
Immature Granulocytes: 1 %
Lymphocytes Relative: 11 %
Lymphs Abs: 1.1 10*3/uL (ref 0.7–4.0)
MCH: 27.2 pg (ref 26.0–34.0)
MCHC: 28.7 g/dL — ABNORMAL LOW (ref 30.0–36.0)
MCV: 94.9 fL (ref 80.0–100.0)
Monocytes Absolute: 0.6 10*3/uL (ref 0.1–1.0)
Monocytes Relative: 6 %
Neutro Abs: 8 10*3/uL — ABNORMAL HIGH (ref 1.7–7.7)
Neutrophils Relative %: 81 %
Platelets: 282 10*3/uL (ref 150–400)
RBC: 3.56 MIL/uL — ABNORMAL LOW (ref 4.22–5.81)
RDW: 15 % (ref 11.5–15.5)
WBC: 9.9 10*3/uL (ref 4.0–10.5)
nRBC: 0.3 % — ABNORMAL HIGH (ref 0.0–0.2)

## 2021-02-15 LAB — URINALYSIS, ROUTINE W REFLEX MICROSCOPIC
Bilirubin Urine: NEGATIVE
Glucose, UA: NEGATIVE mg/dL
Ketones, ur: NEGATIVE mg/dL
Nitrite: NEGATIVE
Protein, ur: 30 mg/dL — AB
Specific Gravity, Urine: 1.012 (ref 1.005–1.030)
WBC, UA: >50 WBC/hpf — ABNORMAL HIGH (ref 0–5)
pH: 7 (ref 5.0–8.0)

## 2021-02-15 LAB — BASIC METABOLIC PANEL
Anion gap: 8 (ref 5–15)
BUN: 21 mg/dL — ABNORMAL HIGH (ref 6–20)
CO2: 27 mmol/L (ref 22–32)
Calcium: 8.2 mg/dL — ABNORMAL LOW (ref 8.9–10.3)
Chloride: 102 mmol/L (ref 98–111)
Creatinine, Ser: 2.18 mg/dL — ABNORMAL HIGH (ref 0.61–1.24)
GFR, Estimated: 34 mL/min — ABNORMAL LOW (ref >60)
Glucose, Bld: 148 mg/dL — ABNORMAL HIGH (ref 70–99)
Potassium: 5.4 mmol/L — ABNORMAL HIGH (ref 3.5–5.1)
Sodium: 137 mmol/L (ref 135–145)

## 2021-02-15 LAB — RESP PANEL BY RT-PCR (FLU A&B, COVID) ARPGX2
Influenza A by PCR: NEGATIVE
Influenza B by PCR: NEGATIVE
SARS Coronavirus 2 by RT PCR: NEGATIVE

## 2021-02-15 LAB — TROPONIN I (HIGH SENSITIVITY)
Troponin I (High Sensitivity): 22 ng/L — ABNORMAL HIGH (ref <18)
Troponin I (High Sensitivity): 21 ng/L — ABNORMAL HIGH (ref <18)

## 2021-02-15 LAB — LACTIC ACID, PLASMA: Lactic Acid, Venous: 1.6 mmol/L (ref 0.5–1.9)

## 2021-02-15 LAB — SODIUM, URINE, RANDOM: Sodium, Ur: 102 mmol/L

## 2021-02-15 LAB — CREATININE, URINE, RANDOM: Creatinine, Urine: 123.82 mg/dL

## 2021-02-15 LAB — D-DIMER, QUANTITATIVE: D-Dimer, Quant: 2.18 ug/mL-FEU — ABNORMAL HIGH (ref 0.00–0.50)

## 2021-02-15 MED ORDER — OLMESARTAN-AMLODIPINE-HCTZ 40-10-25 MG PO TABS: 1.0000 | ORAL_TABLET | Freq: Every day | ORAL | Status: DC

## 2021-02-15 MED ORDER — IRBESARTAN 300 MG PO TABS
300.0000 mg | ORAL_TABLET | Freq: Every day | ORAL | Status: DC
  Filled 2021-02-15: qty 1

## 2021-02-15 MED ORDER — AMLODIPINE BESYLATE 10 MG PO TABS
10.0000 mg | ORAL_TABLET | Freq: Every day | ORAL | Status: DC
  Filled 2021-02-15: qty 1

## 2021-02-15 MED ORDER — CHLORHEXIDINE GLUCONATE CLOTH 2 % EX PADS
6.0000 | MEDICATED_PAD | Freq: Every day | CUTANEOUS | Status: DC
  Administered 2021-02-16 (×2): 6 via TOPICAL

## 2021-02-15 MED ORDER — LIDOCAINE 5 % EX PTCH
1.0000 | MEDICATED_PATCH | CUTANEOUS | Status: DC
  Administered 2021-02-15: 1 via TRANSDERMAL
  Filled 2021-02-15: qty 1

## 2021-02-15 MED ORDER — ASPIRIN EC 81 MG PO TBEC
162.0000 mg | DELAYED_RELEASE_TABLET | Freq: Every morning | ORAL | Status: DC
  Administered 2021-02-16: 162 mg via ORAL
  Filled 2021-02-15: qty 2

## 2021-02-15 MED ORDER — TECHNETIUM TO 99M ALBUMIN AGGREGATED
4.4000 | Freq: Once | INTRAVENOUS | Status: AC | PRN
  Administered 2021-02-15: 4.4 via INTRAVENOUS

## 2021-02-15 MED ORDER — HYDROCHLOROTHIAZIDE 25 MG PO TABS
25.0000 mg | ORAL_TABLET | Freq: Every day | ORAL | Status: DC
  Filled 2021-02-15: qty 1

## 2021-02-15 MED ORDER — SODIUM CHLORIDE 0.9 % IV BOLUS
500.0000 mL | Freq: Once | INTRAVENOUS | Status: AC
  Administered 2021-02-15: 500 mL via INTRAVENOUS

## 2021-02-15 MED ORDER — SENNOSIDES-DOCUSATE SODIUM 8.6-50 MG PO TABS: 1.0000 | ORAL_TABLET | Freq: Every evening | ORAL | Status: DC | PRN

## 2021-02-15 MED ORDER — HEPARIN SODIUM (PORCINE) 5000 UNIT/ML IJ SOLN
5000.0000 [IU] | Freq: Three times a day (TID) | INTRAMUSCULAR | Status: DC
  Administered 2021-02-15 – 2021-02-16 (×4): 5000 [IU] via SUBCUTANEOUS
  Filled 2021-02-15 (×4): qty 1

## 2021-02-15 MED ORDER — LACTATED RINGERS IV SOLN: INTRAVENOUS | Status: AC

## 2021-02-15 MED ORDER — ACETAMINOPHEN 650 MG RE SUPP: 650.0000 mg | Freq: Four times a day (QID) | RECTAL | Status: DC | PRN

## 2021-02-15 MED ORDER — PANTOPRAZOLE SODIUM 20 MG PO TBEC: 20.0000 mg | DELAYED_RELEASE_TABLET | Freq: Every morning | ORAL | Status: DC

## 2021-02-15 MED ORDER — SODIUM ZIRCONIUM CYCLOSILICATE 5 G PO PACK
5.0000 g | PACK | Freq: Once | ORAL | Status: AC
  Administered 2021-02-15: 5 g via ORAL
  Filled 2021-02-15: qty 1

## 2021-02-15 MED ORDER — ACETAMINOPHEN 325 MG PO TABS: 650.0000 mg | ORAL_TABLET | Freq: Four times a day (QID) | ORAL | Status: DC | PRN
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
## 2021-02-15 NOTE — Plan of Care (Signed)

## 2021-02-15 NOTE — ED Notes (Addendum)
Report given to RN assuming care on 6N

## 2021-02-15 NOTE — ED Notes (Signed)
Patient transported to Ultrasound 

## 2021-02-15 NOTE — Progress Notes (Signed)
Attempted to place CPAP on patient. Patient not able to tolerate mask. Will get someone to bring his mask up here and will try CPAP again later.

## 2021-02-15 NOTE — ED Triage Notes (Signed)
Per ems, pt from home. Pt reports last night he started feeling "bad" with a productive cough. Denies SOB at this time. Pt reports feels like pneumonia again. 99% on 2L. Pt A&Ox4, denies pain. Pt wears CPAP at night, paraplegic, bed bound. Colostomy and foley.

## 2021-02-15 NOTE — ED Notes (Signed)
X-ray at bedside

## 2021-02-16 DIAGNOSIS — J9620 Acute and chronic respiratory failure, unspecified whether with hypoxia or hypercapnia: Secondary | ICD-10-CM | POA: Diagnosis not present

## 2021-02-16 LAB — BASIC METABOLIC PANEL
Anion gap: 8 (ref 5–15)
BUN: 19 mg/dL (ref 6–20)
CO2: 28 mmol/L (ref 22–32)
Calcium: 8.4 mg/dL — ABNORMAL LOW (ref 8.9–10.3)
Chloride: 104 mmol/L (ref 98–111)
Creatinine, Ser: 1.47 mg/dL — ABNORMAL HIGH (ref 0.61–1.24)
GFR, Estimated: 55 mL/min — ABNORMAL LOW (ref >60)
Glucose, Bld: 82 mg/dL (ref 70–99)
Potassium: 4.5 mmol/L (ref 3.5–5.1)
Sodium: 140 mmol/L (ref 135–145)

## 2021-02-16 LAB — FERRITIN: Ferritin: 109 ng/mL (ref 24–336)

## 2021-02-16 LAB — CBC
HCT: 31.8 % — ABNORMAL LOW (ref 39.0–52.0)
Hemoglobin: 9.3 g/dL — ABNORMAL LOW (ref 13.0–17.0)
MCH: 26.9 pg (ref 26.0–34.0)
MCHC: 29.2 g/dL — ABNORMAL LOW (ref 30.0–36.0)
MCV: 91.9 fL (ref 80.0–100.0)
Platelets: 279 10*3/uL (ref 150–400)
RBC: 3.46 MIL/uL — ABNORMAL LOW (ref 4.22–5.81)
RDW: 14.7 % (ref 11.5–15.5)
WBC: 7.9 10*3/uL (ref 4.0–10.5)
nRBC: 0.3 % — ABNORMAL HIGH (ref 0.0–0.2)

## 2021-02-16 LAB — HEMOGLOBIN A1C
Hgb A1c MFr Bld: 5.8 % — ABNORMAL HIGH (ref 4.8–5.6)
Mean Plasma Glucose: 119.76 mg/dL

## 2021-02-16 LAB — IRON AND TIBC
Iron: 25 ug/dL — ABNORMAL LOW (ref 45–182)
Saturation Ratios: 10 % — ABNORMAL LOW (ref 17.9–39.5)
TIBC: 262 ug/dL (ref 250–450)
UIBC: 237 ug/dL

## 2021-02-16 LAB — HIV ANTIBODY (ROUTINE TESTING W REFLEX): HIV Screen 4th Generation wRfx: NONREACTIVE

## 2021-02-16 MED ORDER — ZINC OXIDE 12.8 % EX OINT
1.0000 "application " | TOPICAL_OINTMENT | Freq: Two times a day (BID) | CUTANEOUS | Status: DC
  Administered 2021-02-16: 1 via TOPICAL
  Filled 2021-02-16: qty 56.7

## 2021-02-16 MED ORDER — GUAIFENESIN-DM 100-10 MG/5ML PO SYRP
5.0000 mL | ORAL_SOLUTION | Freq: Once | ORAL | Status: AC
  Administered 2021-02-16: 5 mL via ORAL
  Filled 2021-02-16: qty 5

## 2021-02-16 MED ORDER — OXYMETAZOLINE HCL 0.05 % NA SOLN: 1.0000 | Freq: Once | NASAL | Status: DC

## 2021-02-16 MED ORDER — ZINC OXIDE 12.8 % EX OINT: 1.0000 "application " | TOPICAL_OINTMENT | CUTANEOUS | Status: DC | PRN

## 2021-02-16 NOTE — TOC Initial Note (Addendum)
Transition of Care [facility]) - Initial/Assessment Note    Patient Details  Name: [name] MRN: [id number] Date of Birth: 1965-10-16  [facility] of [facility]) CM/SW Contact:    [name] Favre, RN Phone Number: 02/16/2021, 12:23 PM  Clinical Narrative:                 Spoke to patient at bedside. Patient from home, confirmed address. Patient has an aide who assists him at home.   Patient has home oxygen just for  CPAP   through [facility], however, patient states [facility] has not shown him how to use the CPAP.   NCM called [name] with [facility]. He will review patient and arrange an appointment with patient for education. NCM asked [name] to call patient directly to discuss.   Patient has spoken directly to [name] with [facility]. They confirmed patient's address and phone number and made sure patient programmed their number in his phone. Patient instructed [name] to go to his address now and explain to his aide how to use CPAP/oxygen. [name] made sure patient did not want [name] to wait until he was home himself.   Patient does not have a PCP . [name] listed, however he has never seen her before. He is asking if Internal Medicine Teaching Service can follow post discharge. Secure chatted [name] awaiting response. [name] will have his clinic call patient and schedule an appointment   Discussed above and patient in agreement. NCM will leave PTAR paperwork in chart . Patient has someone at home awaiting his arrival. [name] called 3 hour wait. Patient aware.   Expected Discharge Plan: Home/Self Care     Patient Goals and CMS Choice Patient states their goals for this hospitalization and ongoing recovery are:: to return to home CMS Medicare.gov Compare Post Acute Care list provided to:: Patient Choice offered to / list presented to : Patient  Expected Discharge Plan and Services Expected Discharge Plan: Home/Self Care In-house Referral: PCP /  [facility] Discharge Planning Services: CM Consult Post Acute Care Choice: Durable Medical Equipment Living arrangements for the past 2 months: Apartment Expected Discharge Date: 02/16/21               DME Arranged: N/A DME Agency: NA       HH Arranged: NA          Prior Living Arrangements/Services Living arrangements for the past 2 months: Apartment Lives with:: Self Patient language and need for interpreter reviewed:: Yes        Need for Family Participation in Patient Care: Yes (Comment) Care giver support system in place?: Yes (comment)   Criminal Activity/Legal Involvement Pertinent to Current Situation/Hospitalization: No - Comment as needed  Activities of Daily Living      Permission Sought/Granted   Permission granted to share information with : No              Emotional Assessment Appearance:: Appears stated age Attitude/Demeanor/Rapport: Engaged Affect (typically observed): Accepting Orientation: : Oriented to Self,Oriented to Place,Oriented to  Time,Oriented to Situation Alcohol / Substance Use: Not Applicable Psych Involvement: No (comment)  Admission diagnosis:  Hyperkalemia [E87.5] Dyspnea [R06.00] AKI (acute kidney injury) ([place]) [N17.9] Patient Active Problem List   Diagnosis Date Noted  . Dyspnea 02/15/2021  . Acute on chronic respiratory failure ([place]) 02/15/2021  . Sepsis ([place]) 11/12/2020  . Encephalopathy   . Pneumonia of both lungs due to infectious organism   . ARF (acute  renal failure) ([place]) 07/09/2019  . Acute metabolic encephalopathy 56/38/7564  . Polysubstance abuse ([place]) 07/09/2019  . Substance induced mood disorder ([place]) 08/21/2018  . Acute kidney injury ([place]) 08/13/2018  . Altered mental status 02/17/2018  . Acute lower UTI 02/17/2018  . Vomiting feces   . Constipation 02/14/2017  . Colitis 02/13/2017  . Pressure injury of skin 02/13/2017  . Fecal impaction ([place]) 12/07/2016  . Cocaine abuse ([place])   . Decubitus ulcer  of sacral region, stage 4 ([place])   . Urinary tract infection associated with indwelling urethral catheter ([place])   . Enteritis due to Clostridium difficile   . Abdominal pain 07/19/2016  . Osteomyelitis with necrosis of sacrum  06/01/2016  . Colostomy in place for fecal diversion 06/01/2016  . Protein-calorie malnutrition, severe ([place]) 05/31/2016  . Acute renal failure (ARF) ([place]) 05/26/2016  . Dehydration 05/26/2016  . Chronic pain 05/26/2016  . Leukocytosis 05/26/2016  . Decubitus ulcers 05/26/2016  . Wound infection 05/26/2016  . Normocytic anemia 05/26/2016  . Hypertension 05/26/2016  . Paraplegia [facility])    PCP:  [name] Pounds, NP Pharmacy:   [facility] [address] [place] [address] Phone: [phone] Fax: [place], [place] - [address] [place] [address] Phone: [phone] Fax: [phone]     Social Determinants of Health (SDOH) Interventions    Readmission Risk Interventions No flowsheet data found.

## 2021-02-16 NOTE — Plan of Care (Signed)
  Problem: Education: Goal: Knowledge of General Education information will improve Description: Including pain rating scale, medication(s)/side effects and non-pharmacologic comfort measures 02/16/2021 1207 by Camillia Herter, RN Outcome: Adequate for Discharge 02/16/2021 1206 by Camillia Herter, RN Outcome: Progressing   Problem: Health Behavior/Discharge Planning: Goal: Ability to manage health-related needs will improve 02/16/2021 1207 by Camillia Herter, RN Outcome: Adequate for Discharge 02/16/2021 1206 by Camillia Herter, RN Outcome: Progressing   Problem: Clinical Measurements: Goal: Ability to maintain clinical measurements within normal limits will improve 02/16/2021 1207 by Camillia Herter, RN Outcome: Adequate for Discharge 02/16/2021 1206 by Camillia Herter, RN Outcome: Progressing Goal: Will remain free from infection 02/16/2021 1207 by Camillia Herter, RN Outcome: Adequate for Discharge 02/16/2021 1206 by Camillia Herter, RN Outcome: Progressing Goal: Diagnostic test results will improve 02/16/2021 1207 by Camillia Herter, RN Outcome: Adequate for Discharge 02/16/2021 1206 by Camillia Herter, RN Outcome: Progressing Goal: Respiratory complications will improve 02/16/2021 1207 by Camillia Herter, RN Outcome: Adequate for Discharge 02/16/2021 1206 by Camillia Herter, RN Outcome: Progressing Goal: Cardiovascular complication will be avoided 02/16/2021 1207 by Camillia Herter, RN Outcome: Adequate for Discharge 02/16/2021 1206 by Camillia Herter, RN Outcome: Progressing   Problem: Activity: Goal: Risk for activity intolerance will decrease 02/16/2021 1207 by Camillia Herter, RN Outcome: Adequate for Discharge 02/16/2021 1206 by Camillia Herter, RN Outcome: Progressing   Problem: Nutrition: Goal: Adequate nutrition will be maintained 02/16/2021 1207 by Camillia Herter, RN Outcome: Adequate for Discharge 02/16/2021 1206 by Camillia Herter, RN Outcome:  Progressing   Problem: Coping: Goal: Level of anxiety will decrease 02/16/2021 1207 by Camillia Herter, RN Outcome: Adequate for Discharge 02/16/2021 1206 by Camillia Herter, RN Outcome: Progressing   Problem: Elimination: Goal: Will not experience complications related to bowel motility 02/16/2021 1207 by Camillia Herter, RN Outcome: Adequate for Discharge 02/16/2021 1206 by Camillia Herter, RN Outcome: Progressing Goal: Will not experience complications related to urinary retention 02/16/2021 1207 by Camillia Herter, RN Outcome: Adequate for Discharge 02/16/2021 1206 by Camillia Herter, RN Outcome: Progressing   Problem: Pain Managment: Goal: General experience of comfort will improve 02/16/2021 1207 by Camillia Herter, RN Outcome: Adequate for Discharge 02/16/2021 1206 by Camillia Herter, RN Outcome: Progressing   Problem: Safety: Goal: Ability to remain free from injury will improve 02/16/2021 1207 by Camillia Herter, RN Outcome: Adequate for Discharge 02/16/2021 1206 by Camillia Herter, RN Outcome: Progressing   Problem: Skin Integrity: Goal: Risk for impaired skin integrity will decrease 02/16/2021 1207 by Camillia Herter, RN Outcome: Adequate for Discharge 02/16/2021 1206 by Camillia Herter, RN Outcome: Progressing

## 2021-02-16 NOTE — Progress Notes (Signed)
Patient discharged by day shift nurse [place].Patient awaiting [facility] for pickup.

## 2021-02-16 NOTE — Plan of Care (Signed)

## 2021-02-16 NOTE — Progress Notes (Signed)
Patient placed on CPAP using his mask from home. Patient on auto titrate min 15 max 24 due to patient not knowing home CPAP settings and patient stated not enough pressure on lower settings. 3L bled in. Patient tolerating well at this time.
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
## 2021-02-16 NOTE — Discharge Summary (Signed)
Name: [name] MRN: [id number] DOB: 1965-11-14 55 y.o. PCP: Gildardo Pounds, NP  Date of Admission: 02/15/2021  5:10 AM Date of Discharge: 02/16/2021 Attending Physician: Campbell Riches, MD  Discharge Diagnosis: 1. Acute on chronic respiratory failure 2. Acute kidney injury w/ hyperkalemia 3. Hypertension 4. Hyperglycemia 5. Anemia of chronic disease 6. Sacral wound  Discharge Medications: Allergies as of 02/16/2021   No Known Allergies     Medication List    TAKE these medications   Bayer Low Dose 81 MG EC tablet Generic drug: aspirin Take 2 tablets (162 mg total) by mouth in the morning. Swallow whole.   Blood Pressure Monitor Devi Please provide patient with insurance approved blood pressure monitor. ICD10 I.10   famotidine 40 MG tablet Commonly known as: PEPCID Take 1 tablet (40 mg total) by mouth in the morning and at bedtime.   methadone 10 MG tablet Commonly known as: DOLOPHINE Take 85 mg by mouth daily.   Olmesartan-amLODIPine-HCTZ 40-10-25 MG Tabs Take 1 tablet by mouth daily.   OMEGA-3 FISH OIL PO Take 3 capsules by mouth daily.   pantoprazole 20 MG tablet Commonly known as: PROTONIX Take 1 tablet (20 mg total) by mouth in the morning.            Discharge Care Instructions  (From admission, onward)         Start     Ordered   02/16/21 0000  Discharge wound care:       Comments: Thoroughly clean entire groin and buttocks area with soap and water, rinse and dry very well. Cover entire groin area with triple paste, apply BID or PRN soiling.   02/16/21 1153          Disposition and follow-up:   [name] was discharged from [facility] in Stable condition.  At the hospital follow up visit please address:  1. Acute on chronic respiratory failure: Likely 2/2 CPAP non-compliance. Please ensure patient has had Home Health aide come to his house.  2. Acute kidney injury w/ hyperkalemia: Renal function  improved with IVF. UCx pending, please follow-up. Patient afebrile without leukocytosis during admission.  3. Anemia of chronic disease: Can consider iron supplementation.  4. Sacral wound: Please make sure to check wound for any signs of infection.   5.  Labs / imaging needed at time of follow-up: CBC, BMP   6.  Pending labs/ test needing follow-up: Urine culture  Follow-up Appointments:    Follow-up Information    [facility] Follow up.   Why: We will call you to set up an appointment! Please make sure to arrive at the clinic 15 minutes prior to your scheduled time. Contact information: [address] [place] [facility] Follow up.   Why: [facility]  Contact information: [place] [id number] [place] by problem list: 1. Acute on chronic respiratory failure: Patient arrived to the ED 5/17 afebrile, hemodynamically stable on his home 2L supplemental oxygen. Work-up in the ED negative for pneumonia, acute heart failure, or PE. Upon further investigation, patient had experienced mild altered mental status and dyspnea after waking up from not wearing CPAP. Patient was admitted overnight for observation. He wore CPAP and felt back to baseline the next morning. He does report a nurse or aid was supposed  to come to his house to explain how to use the CPAP machine, but no one ever came. Discussed with TOC, who have arranged for [place] to visit patient for further education. Patient to follow-up with [facility].  2. Acute kidney injury w/ hyperkalemia: Lab work on arrival revealed creatinine 2.18, increased from baseline of 1.0-1.1. Patient has Foley catheter, which had not been replaced for 3 months prior to the week before presentation. Currently not experiencing any symptoms. Patient was given fluids overnight with improvement of creatinine to 1.47. Urine  analysis showed possible urinary tract infection, although given he has chronic indwelling catheter unclear whether there is acute infection. Urine cultures were obtained, will have PCP follow-up. Potassium elevated at 5.6 on arrival, likely due to acute kidney injury. Patient was given lokelma and potassium normalized upon lab work the next morning.   3. Hypertension: Patient remained hemodynamically stable throughout admission. No changes in medications needed upon discharge.  4. Hyperglycemia: Last A1c prior to arrival 4 years ago. A1c on arrival 5.8%. Glucose remained at goal throughout rest of admission.  5. Anemia of chronic disease: No signs or symptoms of acute bleeding. Lab work revealed anemia of chronic disease. Did not start iron supplementation during admission, but can consider as outpatient.  6. Sacral wound: Wound care nurses were consulted for chronic sacral wound. Right buttock has full thickness wound that is pink without drainage. Tunneling wound present in left intergluteal fold. Instructions given to clean groin and buttocks area well with soap and water. Ostomy pouch was also examined, no signs of infection. Will need to change ostomy twice weekly.  Discharge Exam:   BP (!) 124/59 (BP Location: Right Wrist)   Pulse 66   Temp 98.2 F (36.8 C) (Oral)   Resp 17   Ht 6\' 1"  (1.854 m)   Wt (!) 144.2 kg   SpO2 100%   BMI 41.96 kg/m  General: Laying in bed, no acute distress CV: Regular rate, rhythm. Distal pulses 2+ bilaterally. Pulm: Normal WOB, clear to auscultation bilaterally Abd: Soft, non-tender, non-distended. Normoactive bowel sounds. MSK: Bilateral lower extremities without edema Skin: Sacral wound covered with bandage Neuro: Awake, alert, answering questions appropriately.  Pertinent Labs, Studies, and Procedures:  CBC Latest Ref Rng & Units 02/16/2021 02/15/2021 11/16/2020  WBC 4.0 - 10.5 K/uL 7.9 9.9 10.4  Hemoglobin 13.0 - 17.0 g/dL 9.3(L) 9.7(L) 11.1(L)   Hematocrit 39.0 - 52.0 % 31.8(L) 33.8(L) 35.8(L)  Platelets 150 - 400 K/uL 279 282 210   BMP Latest Ref Rng & Units 02/16/2021 02/15/2021 11/16/2020  Glucose 70 - 99 mg/dL 82 148(H) 104(H)  BUN 6 - 20 mg/dL 19 21(H) 16  Creatinine 0.61 - 1.24 mg/dL 1.47(H) 2.18(H) 1.12  Sodium 135 - 145 mmol/L 140 137 140  Potassium 3.5 - 5.1 mmol/L 4.5 5.4(H) 4.7  Chloride 98 - 111 mmol/L 104 102 105  CO2 22 - 32 mmol/L 28 27 26   Calcium 8.9 - 10.3 mg/dL 8.4(L) 8.2(L) 8.8(L)   Iron/TIBC/Ferritin/ %Sat    Component Value Date/Time   IRON 25 (L) 02/16/2021 0710   TIBC 262 02/16/2021 0710   FERRITIN 109 02/16/2021 0710   IRONPCTSAT 10 (L) 02/16/2021 0710   Discharge Instructions:   [name],  I am glad you are feeling better and can be discharged today! Likely your symptoms were due to not wearing your CPAP the night prior. We did not find any signs of infection on imaging or lab work. In addition, appears you were dehydrated  which caused a minor kidney injury. We gave you fluids overnight and your kidney function improved. We would like for you to continue eating and drinking well. Please make sure to follow-up with your urologist and change your Foley catheter every 2-4 weeks. Our social workers should help get someone out to your house to help with CPAP and set up a primary care visit.   It was a pleasure to meet you, [name]. I wish you the best and hope you stay happy and healthy!   Thank you,  [name], MD  Signed: [name], MD 02/16/2021, 6:51 AM   Pager: [phone]
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
## 2021-02-16 NOTE — Progress Notes (Signed)
[facility] here to transport patient home.  

## 2021-02-17 DIAGNOSIS — J9620 Acute and chronic respiratory failure, unspecified whether with hypoxia or hypercapnia: Secondary | ICD-10-CM | POA: Diagnosis not present

## 2021-02-18 ENCOUNTER — Telehealth: Payer: Self-pay | Admitting: Student

## 2021-02-18 ENCOUNTER — Other Ambulatory Visit: Payer: Self-pay | Admitting: Student

## 2021-02-18 DIAGNOSIS — N390 Urinary tract infection, site not specified: Secondary | ICD-10-CM

## 2021-02-18 LAB — URINE CULTURE: Culture: >=100000 — AB

## 2021-02-18 MED ORDER — SULFAMETHOXAZOLE-TRIMETHOPRIM 400-80 MG PO TABS
2.0000 | ORAL_TABLET | Freq: Every day | ORAL | 0 refills | Status: DC
Start: 2021-02-18 — End: 2021-03-03

## 2021-02-18 NOTE — Progress Notes (Signed)
UPDATE:  Patient was discharged from hospitalization on 5/18 for acute on chronic respiratory failure from CPAP non-compliance and AKI. After discharge, urine culture resulted with Proteus and Citrobacter >100,000 colonies. Sensitivities returned today. Called Mr. Hamberger this afternoon to discuss this. He states he has been doing well since discharge. Discussed diagnosis of urinary tract infection and that this will need to be treated. Patient requests rx sent to [facility]. Patient to follow-up in [facility] on 5/26.  - Start Bactrim 800-160mg  daily x14 days for catheter-associated urinary tract infection - Follow-up with [facility] on 02/24/21

## 2021-02-18 NOTE — Telephone Encounter (Signed)
TOC HFU [facility] FOR 02/24/2021 @ 1:15 PM WITH DR Barnet Glasgow

## 2021-02-20 LAB — CULTURE, BLOOD (SINGLE)
Culture: NO GROWTH
Special Requests: ADEQUATE

## 2021-02-21 NOTE — Telephone Encounter (Signed)
Transition Care Management Follow-up Telephone Call  Date of discharge and from where:Discharged 02/16/21 from the hospital.  How have you been since you were released from the hospital?  Stated he has been doing "pretty good".  Any questions or concerns?  Voiced no concerns.  Items Reviewed:  Did the pt receive and understand the discharge instructions provided? Yes   Medications obtained and verified? Yes   Any new allergies since your discharge? No   Dietary orders reviewed? Yes.  Do you have support at home?  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  Home Care and Equipment/Supplies: Stated he had oxygen prior to admission via [employer].  Stated he has w/c, oxygen, hospital bed, CPAP; "I have everything I need".  Functional Questionnaire: (I = Independent and D = Dependent) ADLs: Needs assistance - he has an Aide.  Bathing/Dressing- Needs assistance.  Meal Prep- Needs assistance.  Eating- I  Maintaining continence- Needs assistance; has ostomy/foley.  Transferring/Ambulation-  Paraplegic.Stated he has been w/c bound for 40 yrs.  Managing Meds- I  Follow up appointments reviewed:   PCP Hospital f/u appt confirmed? Yes  Scheduled to see Dr Johnney Ou on 5/26/2  @ 1315 PM.  Are transportation arrangements needed? No . Directions given to [facility].  If their condition worsens, is the pt aware to call PCP or go to the Emergency Dept.? Yes  Was the patient provided with contact information for the PCP's office or ED? Yes  Was to pt encouraged to call back with questions or concerns? Yes

## 2021-02-23 ENCOUNTER — Encounter (HOSPITAL_BASED_OUTPATIENT_CLINIC_OR_DEPARTMENT_OTHER): Payer: Medicare Other | Attending: Physician Assistant | Admitting: Internal Medicine

## 2021-02-23 ENCOUNTER — Other Ambulatory Visit: Payer: Self-pay

## 2021-02-23 DIAGNOSIS — L89314 Pressure ulcer of right buttock, stage 4: Secondary | ICD-10-CM | POA: Diagnosis not present

## 2021-02-23 DIAGNOSIS — G8222 Paraplegia, incomplete: Secondary | ICD-10-CM | POA: Insufficient documentation

## 2021-02-23 DIAGNOSIS — I10 Essential (primary) hypertension: Secondary | ICD-10-CM | POA: Insufficient documentation

## 2021-02-23 DIAGNOSIS — S30813A Abrasion of scrotum and testes, initial encounter: Secondary | ICD-10-CM | POA: Diagnosis not present

## 2021-02-23 DIAGNOSIS — L89324 Pressure ulcer of left buttock, stage 4: Secondary | ICD-10-CM | POA: Diagnosis not present

## 2021-02-23 DIAGNOSIS — F17218 Nicotine dependence, cigarettes, with other nicotine-induced disorders: Secondary | ICD-10-CM | POA: Diagnosis not present

## 2021-02-23 DIAGNOSIS — L89154 Pressure ulcer of sacral region, stage 4: Secondary | ICD-10-CM | POA: Diagnosis not present

## 2021-02-23 DIAGNOSIS — G894 Chronic pain syndrome: Secondary | ICD-10-CM | POA: Insufficient documentation

## 2021-02-23 DIAGNOSIS — L89892 Pressure ulcer of other site, stage 2: Secondary | ICD-10-CM | POA: Insufficient documentation

## 2021-02-23 DIAGNOSIS — L89893 Pressure ulcer of other site, stage 3: Secondary | ICD-10-CM | POA: Insufficient documentation

## 2021-02-23 DIAGNOSIS — Z933 Colostomy status: Secondary | ICD-10-CM | POA: Diagnosis not present

## 2021-02-24 ENCOUNTER — Encounter: Payer: Self-pay | Admitting: Student

## 2021-02-24 ENCOUNTER — Ambulatory Visit (INDEPENDENT_AMBULATORY_CARE_PROVIDER_SITE_OTHER): Payer: Medicare Other | Admitting: Student

## 2021-02-24 VITALS — BP 116/56 | HR 71 | Temp 98.5°F | Ht 72.0 in

## 2021-02-24 DIAGNOSIS — D638 Anemia in other chronic diseases classified elsewhere: Secondary | ICD-10-CM | POA: Insufficient documentation

## 2021-02-24 DIAGNOSIS — T83511D Infection and inflammatory reaction due to indwelling urethral catheter, subsequent encounter: Secondary | ICD-10-CM

## 2021-02-24 DIAGNOSIS — K219 Gastro-esophageal reflux disease without esophagitis: Secondary | ICD-10-CM | POA: Diagnosis not present

## 2021-02-24 DIAGNOSIS — N179 Acute kidney failure, unspecified: Secondary | ICD-10-CM

## 2021-02-24 DIAGNOSIS — Z933 Colostomy status: Secondary | ICD-10-CM

## 2021-02-24 DIAGNOSIS — N390 Urinary tract infection, site not specified: Secondary | ICD-10-CM

## 2021-02-24 DIAGNOSIS — I10 Essential (primary) hypertension: Secondary | ICD-10-CM | POA: Diagnosis not present

## 2021-02-24 DIAGNOSIS — D649 Anemia, unspecified: Secondary | ICD-10-CM

## 2021-02-24 DIAGNOSIS — G4733 Obstructive sleep apnea (adult) (pediatric): Secondary | ICD-10-CM | POA: Insufficient documentation

## 2021-02-24 MED ORDER — OLMESARTAN-AMLODIPINE-HCTZ 40-10-25 MG PO TABS: 1.0000 | ORAL_TABLET | Freq: Every day | ORAL | 3 refills | Status: DC

## 2021-02-24 MED ORDER — FAMOTIDINE 40 MG PO TABS: 40.0000 mg | ORAL_TABLET | Freq: Two times a day (BID) | ORAL | 3 refills | Status: DC

## 2021-02-24 NOTE — Assessment & Plan Note (Signed)
Assessment: Blood pressure well controlled on olmesartan-amlodipine-HCTZ.  We will continue patient on this   Plan: -Continue olmesartan-amlodipine-HCTZ 40-10-25 mg daily

## 2021-02-24 NOTE — Assessment & Plan Note (Signed)
Colostomy in place without any complications.  Stoma is pink and moist on my exam.

## 2021-02-24 NOTE — Assessment & Plan Note (Signed)
Assessment: Patient with history of obstructive sleep apnea, was not using CPAP for a while.  This was thought to be the cause of his acute on chronic respiratory failure.  He notes adherence to CPAP since being home and have the assistance of home health aide and setting up his machine.  He has no concerns or complaints and states that he has been sleeping better since he has been using this consistently.  Plan: -Continue use of CPAP

## 2021-02-24 NOTE — Patient Instructions (Signed)
Thank you, [name] for allowing [place] to provide your care today. Today we discussed   Obstructive sleep apnea Please use your CPAP every night.  Please continue to work with home health aides in order to make sure that you have everything you need to use her CPAP machine at home.  If you like you are missing supplies or unable to use it, please call our clinic.  Urinary tract infection You were diagnosed with a urinary tract infection during her hospitalization.  Please continue your course of antibiotics.  If you feel as though that your symptoms return please call our clinic.  Anemia Believe that your anemia is coming from your chronic wounds.  As they continue to heal your anemia will improve.  If you find that you are having blood in your ostomy bag or blood in your urine, please give our clinic a call.  If you feel like you are having excessive bleeding please go to your closest emergency room or call 911.  Sacral wounds  Please continue to follow-up with wound care concerning your wounds.  Please continue to apply baby powder and keep the area dry.  Please continue using zinc  I have ordered the following labs for you:   Lab Orders     CBC no Diff     BMP8+Anion Gap    Referrals ordered today:   Referral Orders  No referral(s) requested today     I have ordered the following medication/changed the following medications:   Stop the following medications: There are no discontinued medications.   Start the following medications: No orders of the defined types were placed in this encounter.    Follow up: 4-6 months    Should you have any questions or concerns please call the internal medicine clinic at [phone].     Sanjuana Letters, D.O. [place]
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
## 2021-02-24 NOTE — Assessment & Plan Note (Signed)
Assessment: Patient did not have an AKI during his hospital admission.  We will repeat BMP today  Plan: -BMP pending

## 2021-02-24 NOTE — Assessment & Plan Note (Addendum)
Assessment: History of GERD well-controlled with famotidine 40 mg twice daily and Protonix 20 mg daily.  Plan: -Continue famotidine 40 mg twice daily and Protonix 10 mg daily

## 2021-02-24 NOTE — Assessment & Plan Note (Signed)
Assessment: During patient's admission he was found to have burning while urinating as well as positive urine cultures for Citrobacter and Proteus mirabilis.  Both are sensitive to Bactrim.  Patient was written for Bactrim 400-80 mg twice daily for 14 days.  Patient states since being started antibiotics his burning while urinating has decreased.  Plan: -Finish course of Bactrim 400-80 mg twice daily for 14 days course.

## 2021-02-24 NOTE — Assessment & Plan Note (Addendum)
Assessment: Patient found to have anemia of chronic disease, iron studies as well as ferritin consistent with this.  I do believe that this is secondary to the chronic inflammation he has from his chronic sacral wounds.  Denies dark tarry or bright red blood in his ostomy bag also denies hematuria.  Patient follows with wound care I believe that as his wounds continue to heal his anemia will improve.  Do not think patient needs iron or blood supplement at this time.  We will repeat CBC today  Plan: -CBC pending  Anemia Stable. Patient also found to have leukocytosis. Patient afebrile and without tachycardia. Will call and discuss findings with him to return to clinic if he develops any new symptoms or if he feels as though his wounds are worsening. Do not suspect this is from his UTI as symptoms have improved.

## 2021-02-25 LAB — CBC
Hematocrit: 30.6 % — ABNORMAL LOW (ref 37.5–51.0)
Hemoglobin: 9.4 g/dL — ABNORMAL LOW (ref 13.0–17.7)
MCH: 26.9 pg (ref 26.6–33.0)
MCHC: 30.7 g/dL — ABNORMAL LOW (ref 31.5–35.7)
MCV: 87 fL (ref 79–97)
Platelets: 377 10*3/uL (ref 150–450)
RBC: 3.5 x10E6/uL — ABNORMAL LOW (ref 4.14–5.80)
RDW: 14.3 % (ref 11.6–15.4)
WBC: 12.1 10*3/uL — ABNORMAL HIGH (ref 3.4–10.8)

## 2021-02-25 LAB — BMP8+ANION GAP
Anion Gap: 20 mmol/L — ABNORMAL HIGH (ref 10.0–18.0)
BUN/Creatinine Ratio: 12 (ref 9–20)
BUN: 32 mg/dL — ABNORMAL HIGH (ref 6–24)
CO2: 20 mmol/L (ref 20–29)
Calcium: 8.9 mg/dL (ref 8.7–10.2)
Chloride: 98 mmol/L (ref 96–106)
Creatinine, Ser: 2.75 mg/dL — ABNORMAL HIGH (ref 0.76–1.27)
Glucose: 86 mg/dL (ref 65–99)
Potassium: 4.7 mmol/L (ref 3.5–5.2)
Sodium: 138 mmol/L (ref 134–144)
eGFR: 26 mL/min/{1.73_m2} — ABNORMAL LOW (ref >59)

## 2021-03-01 NOTE — Progress Notes (Signed)
[name], [name] ([id number]) Visit Report for 02/23/2021 Arrival Information Details Patient Name: Date of Service: CA RA [name] 02/23/2021 9:45 A M Medical Record Number: [id number] Patient Account Number: [id number] Date of Birth/Sex: Treating RN: [birth year] (55 y.o. [name] Primary Care [name]: [name] Other Clinician: Referring Veronia Laprise: Treating [name]/Extender: Lamount Cranker in Treatment: 3 Visit Information History Since Last Visit Added or deleted any medications: No Patient Arrived: Wheel Chair Any new allergies or adverse reactions: No Arrival Time: 10:17 Had a fall or experienced change in No Accompanied By: caregiver activities of daily living that may affect Transfer Assistance: Manual risk of falls: Patient Identification Verified: Yes Signs or symptoms of abuse/neglect since last visito No Secondary Verification Process Completed: Yes Hospitalized since last visit: No Patient Requires Transmission-Based Precautions: No Implantable device outside of the clinic excluding No Patient Has Alerts: No cellular tissue based products placed in the center since last visit: Has Dressing in Place as Prescribed: Yes Pain Present Now: Yes Electronic Signature(s) Signed: 02/23/2021 1:58:35 PM By: Sandre Kitty Entered By: Sandre Kitty on 02/23/2021 10:17:27 -------------------------------------------------------------------------------- Clinic Level of Care Assessment Details Patient Name: Date of Service: CA [name] 02/23/2021 9:45 A M Medical Record Number: [id number] Patient Account Number: [id number] Date of Birth/Sex: Treating RN: 1965-12-27 (55 y.o. [name] Primary Care Monzerrath Mcburney: [name] Other Clinician: Referring Maryah Marinaro: Treating Kaena Santori/Extender: Mack Hook Weeks in Treatment: 186 Clinic Level of Care Assessment Items TOOL 4 Quantity Score []  -  0 Use when only an EandM is performed on FOLLOW-UP visit ASSESSMENTS - Nursing Assessment / Reassessment X- 1 10 Reassessment of Co-morbidities (includes updates in patient status) X- 1 5 Reassessment of Adherence to Treatment Plan ASSESSMENTS - Wound and Skin A ssessment / Reassessment []  - 0 Simple Wound Assessment / Reassessment - one wound X- 4 5 Complex Wound Assessment / Reassessment - multiple wounds []  - 0 Dermatologic / Skin Assessment (not related to wound area) ASSESSMENTS - Focused Assessment []  - 0 Circumferential Edema Measurements - multi extremities []  - 0 Nutritional Assessment / Counseling / Intervention []  - 0 Lower Extremity Assessment (monofilament, tuning fork, pulses) []  - 0 Peripheral Arterial Disease Assessment (using hand held doppler) ASSESSMENTS - Ostomy and/or Continence Assessment and Care []  - 0 Incontinence Assessment and Management []  - 0 Ostomy Care Assessment and Management (repouching, etc.) PROCESS - Coordination of Care X - Simple Patient / Family Education for ongoing care 1 15 []  - 0 Complex (extensive) Patient / Family Education for ongoing care X- 1 10 Staff obtains Programmer, systems, Records, T Results / Process Orders est []  - 0 Staff telephones HHA, Nursing Homes / Clarify orders / etc []  - 0 Routine Transfer to another Facility (non-emergent condition) []  - 0 Routine Hospital Admission (non-emergent condition) []  - 0 New Admissions / Biomedical engineer / Ordering NPWT Apligraf, etc. , []  - 0 Emergency Hospital Admission (emergent condition) X- 1 10 Simple Discharge Coordination []  - 0 Complex (extensive) Discharge Coordination PROCESS - Special Needs []  - 0 Pediatric / Minor Patient Management []  - 0 Isolation Patient Management []  - 0 Hearing / Language / Visual special needs []  - 0 Assessment of Community assistance (transportation, D/C planning, etc.) []  - 0 Additional assistance / Altered mentation []  -  0 Support Surface(s) Assessment (bed, cushion, seat, etc.) INTERVENTIONS - Wound Cleansing / Measurement []  - 0 Simple Wound Cleansing - one wound X- 4 5 Complex Wound Cleansing - multiple wounds  X- 1 5 Wound Imaging (photographs - any number of wounds) []  - 0 Wound Tracing (instead of photographs) []  - 0 Simple Wound Measurement - one wound X- 4 5 Complex Wound Measurement - multiple wounds INTERVENTIONS - Wound Dressings X - Small Wound Dressing one or multiple wounds 4 10 []  - 0 Medium Wound Dressing one or multiple wounds []  - 0 Large Wound Dressing one or multiple wounds X- 1 5 Application of Medications - topical []  - 0 Application of Medications - injection INTERVENTIONS - Miscellaneous []  - 0 External ear exam []  - 0 Specimen Collection (cultures, biopsies, blood, body fluids, etc.) []  - 0 Specimen(s) / Culture(s) sent or taken to Lab for analysis []  - 0 Patient Transfer (multiple staff / Civil Service fast streamer / Similar devices) []  - 0 Simple Staple / Suture removal (25 or less) []  - 0 Complex Staple / Suture removal (26 or more) []  - 0 Hypo / Hyperglycemic Management (close monitor of Blood Glucose) []  - 0 Ankle / Brachial Index (ABI) - do not check if billed separately X- 1 5 Vital Signs Has the patient been seen at the hospital within the last three years: Yes Total Score: 165 Level Of Care: New/Established - Level 5 Electronic Signature(s) Signed: 02/23/2021 4:28:15 PM By: Baruch Gouty RN, BSN Entered By: Baruch Gouty on 02/23/2021 11:44:42 -------------------------------------------------------------------------------- Encounter Discharge Information Details Patient Name: Date of Service: CA RA [name] RRIN 02/23/2021 9:45 A M Medical Record Number: [id number] Patient Account Number: [id number] Date of Birth/Sex: Treating RN: 1966-02-08 (55 y.o. [name], [name] Primary Care Denia Mcvicar: [name] Other Clinician: Referring Kairi Harshbarger: Treating  Blia Totman/Extender: Mack Hook Weeks in Treatment: [phone] Encounter Discharge Information Items Discharge Condition: Stable Ambulatory Status: Wheelchair Discharge Destination: Home Transportation: Private Auto Accompanied By: self Schedule Follow-up Appointment: Yes Clinical Summary of Care: Patient Declined Electronic Signature(s) Signed: 02/23/2021 4:26:30 PM By: Rhae Hammock RN Entered By: Rhae Hammock on 02/23/2021 11:54:17 -------------------------------------------------------------------------------- Multi Wound Chart Details Patient Name: Date of Service: CA RA [place], [place] 02/23/2021 9:45 A M Medical Record Number: [id number] Patient Account Number: [id number] Date of Birth/Sex: Treating RN: 1966/02/09 (55 y.o. [name] Primary Care Nikiya Starn: [name] Other Clinician: Referring Chrishawn Kring: Treating Alise Calais/Extender: Mack Hook Weeks in Treatment: 186 Vital Signs Height(in): 72 Pulse(bpm): 80 Weight(lbs): 215 Blood Pressure(mmHg): 143/72 Body Mass Index(BMI): 29 Temperature(F): 98.1 Respiratory Rate(breaths/min): 20 Photos: [19:No Photos Sacrum] [21:No Photos Right, Medial Ischium] [22:No Photos Left Ischium] Wound Location: [19:Pressure Injury] [21:Gradually Appeared] [22:Gradually Appeared] Wounding Event: [19:Pressure Ulcer] [21:Pressure Ulcer] [22:Pressure Ulcer] Primary Etiology: [19:Anemia, Hypertension, Colitis,] [21:Anemia, Hypertension, Colitis,] [22:Anemia, Hypertension, Colitis,] Comorbid History: [19:Osteomyelitis, Paraplegia 10/02/2013] [21:Osteomyelitis, Paraplegia 10/02/2012] [22:Osteomyelitis, Paraplegia 10/02/2013] Date Acquired: [19:186] [21:151] [22:151] Weeks of Treatment: [19:Open] [21:Open] [22:Open] Wound Status: [19:No] [21:No] [22:No] Clustered Wound: [19:N/A] [21:N/A] [22:N/A] Clustered Quantity: [19:1x0.7x0.6] [21:0.3x0.3x3] [22:2x0.8x4.5] Measurements L x W x D (cm) [19:0.55]  [21:0.071] [22:1.257] A (cm) : rea [19:0.33] [21:0.212] [22:5.655] Volume (cm) : [19:74.30%] [21:97.50%] [22:-196.50%] % Reduction in A rea: [19:88.10%] [21:96.30%] [22:-376.00%] % Reduction in Volume: [22:12] Starting Position 1 (o'clock): [22:12] Ending Position 1 (o'clock): [22:4] Maximum Distance 1 (cm): [19:No] [21:No] [22:Yes] Undermining: [19:Category/Stage IV] [21:Category/Stage II] [22:Category/Stage II] Classification: [19:Medium] [21:Small] [22:Medium] Exudate A mount: [19:Serosanguineous] [21:Serosanguineous] [22:Serosanguineous] Exudate Type: [19:red, brown] [21:red, brown] [22:red, brown] Exudate Color: [19:Fibrotic scar, thickened scar] [21:Well defined, not attached] [22:Epibole] Wound Margin: [19:Large (67-100%)] [21:Large (67-100%)] [22:Large (67-100%)] Granulation A mount: [19:Pink, Pale] [21:Pink] [22:Pink, Pale] Granulation Quality: [19:None Present (0%)] [21:None  Present (0%)] [22:None Present (0%)] Necrotic A mount: [19:Fat Layer (Subcutaneous Tissue): Yes Fat Layer (Subcutaneous Tissue): Yes Fat Layer (Subcutaneous Tissue): Yes] Exposed Structures: [19:Fascia: No Tendon: No Muscle: No Joint: No Bone: No Medium (34-66%)] [21:Fascia: No Tendon: No Muscle: No Joint: No Bone: No Medium (34-66%)] [22:Fascia: No Tendon: No Muscle: No Joint: No Bone: No Small (1-33%)] Wound Number: 27 N/A N/A Photos: No Photos N/A N/A Scrotum N/A N/A Wound Location: Pressure Injury N/A N/A Wounding Event: Pressure Ulcer N/A N/A Primary Etiology: Anemia, Hypertension, Colitis, N/A N/A Comorbid History: Osteomyelitis, Paraplegia 02/23/2021 N/A N/A Date Acquired: 0 N/A N/A Weeks of Treatment: Open N/A N/A Wound Status: Yes N/A N/A Clustered Wound: 5 N/A N/A Clustered Quantity: 11.5x7x0.1 N/A N/A Measurements L x W x D (cm) 63.225 N/A N/A A (cm) : rea 6.322 N/A N/A Volume (cm) : N/A N/A N/A % Reduction in A rea: N/A N/A N/A % Reduction in Volume: No N/A  N/A Undermining: Category/Stage III N/A N/A Classification: Medium N/A N/A Exudate A mount: Serosanguineous N/A N/A Exudate Type: red, brown N/A N/A Exudate Color: Flat and Intact N/A N/A Wound Margin: Large (67-100%) N/A N/A Granulation A mount: Red, Pink N/A N/A Granulation Quality: Small (1-33%) N/A N/A Necrotic A mount: Fat Layer (Subcutaneous Tissue): Yes N/A N/A Exposed Structures: Fascia: No Tendon: No Muscle: No Joint: No Bone: No None N/A N/A Epithelialization: Treatment Notes Electronic Signature(s) Signed: 02/23/2021 11:06:02 AM By: Kalman Shan DO Signed: 02/23/2021 4:28:15 PM By: Baruch Gouty RN, BSN Entered By: Kalman Shan on 02/23/2021 11:00:24 -------------------------------------------------------------------------------- Multi-Disciplinary Care Plan Details Patient Name: Date of Service: CA RA [place], [name] RRIN 02/23/2021 9:45 A M Medical Record Number: [id number] Patient Account Number: [id number] Date of Birth/Sex: Treating RN: 07-17-1966 (55 y.o. [name] Primary Care Lenyx Boody: [name] Other Clinician: Referring Brizeida Mcmurry: Treating Quinlyn Tep/Extender: Lamount Cranker in Treatment: [place] reviewed with physician Active Inactive Pressure Nursing Diagnoses: Knowledge deficit related to causes and risk factors for pressure ulcer development Knowledge deficit related to management of pressures ulcers Potential for impaired tissue integrity related to pressure, friction, moisture, and shear Goals: Patient will remain free from development of additional pressure ulcers Date Initiated: 12/12/2017 Date Inactivated: 08/14/2018 Target Resolution Date: 08/21/2018 Goal Status: Unmet Unmet Reason: hospitalized Patient will remain free of pressure ulcers Date Initiated: 12/12/2017 Date Inactivated: 02/06/2018 Target Resolution Date: 02/06/2018 Unmet Reason: pt noncompliant wth Goal  Status: Unmet offloading Patient/caregiver will verbalize understanding of pressure ulcer management Date Initiated: 12/12/2017 Target Resolution Date: 03/23/2021 Goal Status: Active Interventions: Assess: immobility, friction, shearing, incontinence upon admission and as needed Assess offloading mechanisms upon admission and as needed Assess potential for pressure ulcer upon admission and as needed Notes: Wound/Skin Impairment Nursing Diagnoses: Impaired tissue integrity Goals: Patient/caregiver will verbalize understanding of skin care regimen Date Initiated: 03/06/2018 Target Resolution Date: 03/23/2021 Goal Status: Active Ulcer/skin breakdown will heal within 14 weeks Date Initiated: 07/31/2017 Date Inactivated: 01/09/2018 Target Resolution Date: 11/30/2017 Unmet Reason: noncompliant with Goal Status: Unmet offloading Interventions: Assess patient/caregiver ability to perform ulcer/skin care regimen upon admission and as needed Notes: Electronic Signature(s) Signed: 02/23/2021 4:28:15 PM By: Baruch Gouty RN, BSN Entered By: Baruch Gouty on 02/23/2021 11:43:37 -------------------------------------------------------------------------------- Pain Assessment Details Patient Name: Date of Service: CA RA [place], [name] RRIN 02/23/2021 9:45 A M Medical Record Number: [id number] Patient Account Number: [id number] Date of Birth/Sex: Treating RN: 1966-03-30 (55 y.o. [name] Primary Care Angelette Ganus: [name] Other Clinician: Referring Ezmeralda Stefanick: Treating Aanya Haynes/Extender:  Mack Hook Weeks in Treatment: 186 Active Problems Location of Pain Severity and Description of Pain Patient Has Paino Yes Site Locations Rate the pain. Current Pain Level: 4 Pain Management and Medication Current Pain Management: Electronic Signature(s) Signed: 02/23/2021 1:58:35 PM By: Sandre Kitty Signed: 02/23/2021 4:28:15 PM By: Baruch Gouty RN, BSN Entered By:  Sandre Kitty on 02/23/2021 10:17:51 -------------------------------------------------------------------------------- Patient/Caregiver Education Details Patient Name: Date of Service: CA RA [name], [name] RRIN 5/25/2022andnbsp9:45 [place] Record Number: [id number] Patient Account Number: [id number] Date of Birth/Gender: Treating RN: 10/27/1965 (55 y.o. [name] Primary Care Physician: [name] Other Clinician: Referring Physician: Treating Physician/Extender: Lamount Cranker in Treatment: [phone] Education Assessment Education Provided To: Patient Education Topics Provided Offloading: Methods: Explain/Verbal Responses: Reinforcements needed, State content correctly Wound/Skin Impairment: Methods: Explain/Verbal Responses: Reinforcements needed, State content correctly Electronic Signature(s) Signed: 02/23/2021 4:28:15 PM By: Baruch Gouty RN, BSN Entered By: Baruch Gouty on 02/23/2021 11:44:01 -------------------------------------------------------------------------------- Wound Assessment Details Patient Name: Date of Service: CA RA [place], [name] RRIN 02/23/2021 9:45 A M Medical Record Number: [id number] Patient Account Number: [id number] Date of Birth/Sex: Treating RN: 1966/04/06 (55 y.o. Janyth Contes Primary Care Kaicen Desena: [name] Other Clinician: Referring Jazmeen Axtell: Treating Kohle Winner/Extender: Mack Hook Weeks in Treatment: 186 Wound Status Wound Number: 19 Primary Etiology: Pressure Ulcer Wound Location: Sacrum Wound Status: Open Wounding Event: Pressure Injury Comorbid History: Anemia, Hypertension, Colitis, Osteomyelitis, Paraplegia Date Acquired: 10/02/2013 Weeks Of Treatment: 186 Clustered Wound: No Photos Wound Measurements Length: (cm) 1 Width: (cm) 0.7 Depth: (cm) 0.6 Area: (cm) 0.55 Volume: (cm) 0.33 % Reduction in Area: 74.3% % Reduction in Volume: 88.1% Epithelialization:  Medium (34-66%) Tunneling: No Undermining: No Wound Description Classification: Category/Stage IV Wound Margin: Fibrotic scar, thickened scar Exudate Amount: Medium Exudate Type: Serosanguineous Exudate Color: red, brown Foul Odor After Cleansing: No Slough/Fibrino No Wound Bed Granulation Amount: Large (67-100%) Exposed Structure Granulation Quality: Pink, Pale Fascia Exposed: No Necrotic Amount: None Present (0%) Fat Layer (Subcutaneous Tissue) Exposed: Yes Tendon Exposed: No Muscle Exposed: No Joint Exposed: No Bone Exposed: No Treatment Notes Wound #19 (Sacrum) Cleanser Peri-Wound Care Topical Primary Dressing KerraCel Ag Gelling Fiber Dressing, 4x5 in (silver alginate) Discharge Instruction: Apply silver alginate to wound bed, do not pack, just tuck lightly into wounds as instructed Secondary Dressing ABD Pad, 8x10 Discharge Instruction: Apply over primary dressing as directed. Secured With 28M Medipore H Soft Cloth Surgical T 4 x 2 (in/yd) ape Discharge Instruction: Secure dressing with tape as directed. Compression Wrap Compression Stockings Add-Ons Electronic Signature(s) Signed: 02/23/2021 4:40:56 PM By: Levan Hurst RN, BSN Signed: 03/01/2021 5:24:49 PM By: Sandre Kitty Entered By: Sandre Kitty on 02/23/2021 16:02:44 -------------------------------------------------------------------------------- Wound Assessment Details Patient Name: Date of Service: CA RA [place], [name] RRIN 02/23/2021 9:45 A M Medical Record Number: [id number] Patient Account Number: [id number] Date of Birth/Sex: Treating RN: 22-Feb-1966 (55 y.o. Janyth Contes Primary Care Traveon Louro: [name] Other Clinician: Referring Yoav Okane: Treating Alekai Pocock/Extender: Mack Hook Weeks in Treatment: 186 Wound Status Wound Number: 21 Primary Etiology: Pressure Ulcer Wound Location: Right, Medial Ischium Wound Status: Open Wounding Event: Gradually  Appeared Comorbid History: Anemia, Hypertension, Colitis, Osteomyelitis, Paraplegia Date Acquired: 10/02/2012 Weeks Of Treatment: 151 Clustered Wound: No Photos Wound Measurements Length: (cm) 0.3 Width: (cm) 0.3 Depth: (cm) 3 Area: (cm) 0.071 Volume: (cm) 0.212 % Reduction in Area: 97.5% % Reduction in Volume: 96.3% Epithelialization: Medium (34-66%) Tunneling: No Undermining: No Wound Description Classification: Category/Stage II Wound Margin: Well  defined, not attached Exudate Amount: Small Exudate Type: Serosanguineous Exudate Color: red, brown Foul Odor After Cleansing: No Slough/Fibrino No Wound Bed Granulation Amount: Large (67-100%) Exposed Structure Granulation Quality: Pink Fascia Exposed: No Necrotic Amount: None Present (0%) Fat Layer (Subcutaneous Tissue) Exposed: Yes Tendon Exposed: No Muscle Exposed: No Joint Exposed: No Bone Exposed: No Treatment Notes Wound #21 (Ischium) Wound Laterality: Right, Medial Cleanser Peri-Wound Care Topical Primary Dressing KerraCel Ag Gelling Fiber Dressing, 4x5 in (silver alginate) Discharge Instruction: Apply silver alginate to wound bed, do not pack, just tuck lightly into wounds as instructed Secondary Dressing ABD Pad, 8x10 Discharge Instruction: Apply over primary dressing as directed. Secured With 39M Medipore H Soft Cloth Surgical T 4 x 2 (in/yd) ape Discharge Instruction: Secure dressing with tape as directed. Compression Wrap Compression Stockings Add-Ons Electronic Signature(s) Signed: 02/23/2021 4:40:56 PM By: Levan Hurst RN, BSN Signed: 03/01/2021 5:24:49 PM By: Sandre Kitty Entered By: Sandre Kitty on 02/23/2021 16:04:11 -------------------------------------------------------------------------------- Wound Assessment Details Patient Name: Date of Service: CA RA [place], [name] RRIN 02/23/2021 9:45 A M Medical Record Number: [id number] Patient Account Number: [id number] Date of  Birth/Sex: Treating RN: [birth year] (55 y.o. Janyth Contes Primary Care Ivery Nanney: [name] Other Clinician: Referring Kylinn Shropshire: Treating Darcelle Herrada/Extender: Mack Hook Weeks in Treatment: 186 Wound Status Wound Number: 22 Primary Etiology: Pressure Ulcer Wound Location: Left Ischium Wound Status: Open Wounding Event: Gradually Appeared Comorbid History: Anemia, Hypertension, Colitis, Osteomyelitis, Paraplegia Date Acquired: 10/02/2013 Weeks Of Treatment: 151 Clustered Wound: No Photos Wound Measurements Length: (cm) 2 Width: (cm) 0.8 Depth: (cm) 4.5 Area: (cm) 1.257 Volume: (cm) 5.655 % Reduction in Area: -196.5% % Reduction in Volume: -376% Epithelialization: Small (1-33%) Tunneling: No Undermining: Yes Starting Position (o'clock): 12 Ending Position (o'clock): 12 Maximum Distance: (cm) 4 Wound Description Classification: Category/Stage II Wound Margin: Epibole Exudate Amount: Medium Exudate Type: Serosanguineous Exudate Color: red, brown Foul Odor After Cleansing: No Slough/Fibrino No Wound Bed Granulation Amount: Large (67-100%) Exposed Structure Granulation Quality: Pink, Pale Fascia Exposed: No Necrotic Amount: None Present (0%) Fat Layer (Subcutaneous Tissue) Exposed: Yes Tendon Exposed: No Muscle Exposed: No Joint Exposed: No Bone Exposed: No Treatment Notes Wound #22 (Ischium) Wound Laterality: Left Cleanser Peri-Wound Care Topical Primary Dressing KerraCel Ag Gelling Fiber Dressing, 4x5 in (silver alginate) Discharge Instruction: Apply silver alginate to wound bed, do not pack, just tuck lightly into wounds as instructed Secondary Dressing ABD Pad, 8x10 Discharge Instruction: Apply over primary dressing as directed. Secured With 39M Medipore H Soft Cloth Surgical T 4 x 2 (in/yd) ape Discharge Instruction: Secure dressing with tape as directed. Compression Wrap Compression Stockings Add-Ons Electronic  Signature(s) Signed: 02/23/2021 4:40:56 PM By: Levan Hurst RN, BSN Signed: 03/01/2021 5:24:49 PM By: Sandre Kitty Entered By: Sandre Kitty on 02/23/2021 16:03:47 -------------------------------------------------------------------------------- Wound Assessment Details Patient Name: Date of Service: CA RA [place], [name] RRIN 02/23/2021 9:45 A M Medical Record Number: [id number] Patient Account Number: [id number] Date of Birth/Sex: Treating RN: 1966-04-18 (55 y.o. Janyth Contes Primary Care Shamarra Warda: [name] Other Clinician: Referring Tayler Lassen: Treating Abubakr Wieman/Extender: Mack Hook Weeks in Treatment: 186 Wound Status Wound Number: 27 Primary Etiology: Pressure Ulcer Wound Location: Scrotum Wound Status: Open Wounding Event: Pressure Injury Comorbid History: Anemia, Hypertension, Colitis, Osteomyelitis, Paraplegia Date Acquired: 02/23/2021 Weeks Of Treatment: 0 Clustered Wound: Yes Photos Wound Measurements Length: (cm) 11.5 Width: (cm) 7 Depth: (cm) 0.1 Clustered Quantity: 5 Area: (cm) 63.225 Volume: (cm) 6.322 % Reduction in Area: 0% % Reduction in Volume: 0% Epithelialization: None  Tunneling: No Undermining: No Wound Description Classification: Category/Stage III Wound Margin: Flat and Intact Exudate Amount: Medium Exudate Type: Serosanguineous Exudate Color: red, brown Foul Odor After Cleansing: No Slough/Fibrino Yes Wound Bed Granulation Amount: Large (67-100%) Exposed Structure Granulation Quality: Red, Pink Fascia Exposed: No Necrotic Amount: Small (1-33%) Fat Layer (Subcutaneous Tissue) Exposed: Yes Necrotic Quality: Adherent Slough Tendon Exposed: No Muscle Exposed: No Joint Exposed: No Bone Exposed: No Treatment Notes Wound #27 (Scrotum) Cleanser Peri-Wound Care Zinc Oxide Ointment 30g tube Discharge Instruction: Apply Zinc Oxide to periwound with each dressing change Topical Primary Dressing KerraCel Ag  Gelling Fiber Dressing, 4x5 in (silver alginate) Discharge Instruction: Apply silver alginate to wound bed as instructed Secondary Dressing ABD Pad, 8x10 Discharge Instruction: Apply over primary dressing, may change daily to keep dry Secured With Compression Wrap Compression Stockings Add-Ons Electronic Signature(s) Signed: 02/23/2021 4:40:56 PM By: Levan Hurst RN, BSN Signed: 03/01/2021 5:24:49 PM By: Sandre Kitty Entered By: Sandre Kitty on 02/23/2021 16:02:18 -------------------------------------------------------------------------------- Vitals Details Patient Name: Date of Service: [name], [name] RRIN 02/23/2021 9:45 A M Medical Record Number: [id number] Patient Account Number: [id number] Date of Birth/Sex: Treating RN: [birth year] (55 y.o. [name] Primary Care Marguriete Wootan: [name] Other Clinician: Referring Doc Mandala: Treating Gotham Raden/Extender: Mack Hook Weeks in Treatment: 186 Vital Signs Time Taken: 10:17 Temperature (F): 98.1 Height (in): 72 Pulse (bpm): 80 Weight (lbs): 215 Respiratory Rate (breaths/min): 20 Body Mass Index (BMI): 29.2 Blood Pressure (mmHg): 143/72 Reference Range: 80 - 120 mg / dl Electronic Signature(s) Signed: 02/23/2021 1:58:35 PM By: Sandre Kitty Entered By: Sandre Kitty on 02/23/2021 10:17:42

## 2021-03-02 ENCOUNTER — Other Ambulatory Visit: Payer: Self-pay

## 2021-03-02 ENCOUNTER — Observation Stay (HOSPITAL_COMMUNITY)
Admission: EM | Admit: 2021-03-02 | Discharge: 2021-03-03 | Disposition: A | Discharge status: Home / Self Care | Payer: Medicare Other | Attending: Internal Medicine | Admitting: Internal Medicine

## 2021-03-02 ENCOUNTER — Emergency Department (HOSPITAL_COMMUNITY): Payer: Medicare Other

## 2021-03-02 DIAGNOSIS — N189 Chronic kidney disease, unspecified: Secondary | ICD-10-CM | POA: Insufficient documentation

## 2021-03-02 DIAGNOSIS — L89309 Pressure ulcer of unspecified buttock, unspecified stage: Secondary | ICD-10-CM

## 2021-03-02 DIAGNOSIS — S3130XD Unspecified open wound of scrotum and testes, subsequent encounter: Principal | ICD-10-CM | POA: Insufficient documentation

## 2021-03-02 DIAGNOSIS — T148XXA Other injury of unspecified body region, initial encounter: Secondary | ICD-10-CM

## 2021-03-02 DIAGNOSIS — T8130XD Disruption of wound, unspecified, subsequent encounter: Secondary | ICD-10-CM | POA: Diagnosis not present

## 2021-03-02 DIAGNOSIS — W3400XD Accidental discharge from unspecified firearms or gun, subsequent encounter: Secondary | ICD-10-CM | POA: Diagnosis not present

## 2021-03-02 DIAGNOSIS — L89159 Pressure ulcer of sacral region, unspecified stage: Secondary | ICD-10-CM | POA: Insufficient documentation

## 2021-03-02 DIAGNOSIS — Z79899 Other long term (current) drug therapy: Secondary | ICD-10-CM | POA: Diagnosis not present

## 2021-03-02 DIAGNOSIS — Z20822 Contact with and (suspected) exposure to covid-19: Secondary | ICD-10-CM | POA: Insufficient documentation

## 2021-03-02 DIAGNOSIS — I129 Hypertensive chronic kidney disease with stage 1 through stage 4 chronic kidney disease, or unspecified chronic kidney disease: Secondary | ICD-10-CM | POA: Diagnosis not present

## 2021-03-02 DIAGNOSIS — N179 Acute kidney failure, unspecified: Secondary | ICD-10-CM | POA: Diagnosis not present

## 2021-03-02 DIAGNOSIS — F17210 Nicotine dependence, cigarettes, uncomplicated: Secondary | ICD-10-CM | POA: Insufficient documentation

## 2021-03-02 LAB — URINALYSIS, COMPLETE (UACMP) WITH MICROSCOPIC
Bilirubin Urine: NEGATIVE
Glucose, UA: NEGATIVE mg/dL
Hgb urine dipstick: NEGATIVE
Ketones, ur: NEGATIVE mg/dL
Nitrite: NEGATIVE
Protein, ur: NEGATIVE mg/dL
Specific Gravity, Urine: 1.011 (ref 1.005–1.030)
pH: 6 (ref 5.0–8.0)

## 2021-03-02 LAB — CBC WITH DIFFERENTIAL/PLATELET
Abs Immature Granulocytes: 0.04 10*3/uL (ref 0.00–0.07)
Basophils Absolute: 0.1 10*3/uL (ref 0.0–0.1)
Basophils Relative: 1 %
Eosinophils Absolute: 0.5 10*3/uL (ref 0.0–0.5)
Eosinophils Relative: 5 %
HCT: 30.9 % — ABNORMAL LOW (ref 39.0–52.0)
Hemoglobin: 9.1 g/dL — ABNORMAL LOW (ref 13.0–17.0)
Immature Granulocytes: 0 %
Lymphocytes Relative: 21 %
Lymphs Abs: 1.9 10*3/uL (ref 0.7–4.0)
MCH: 26.8 pg (ref 26.0–34.0)
MCHC: 29.4 g/dL — ABNORMAL LOW (ref 30.0–36.0)
MCV: 91.2 fL (ref 80.0–100.0)
Monocytes Absolute: 0.8 10*3/uL (ref 0.1–1.0)
Monocytes Relative: 9 %
Neutro Abs: 5.9 10*3/uL (ref 1.7–7.7)
Neutrophils Relative %: 64 %
Platelets: 293 10*3/uL (ref 150–400)
RBC: 3.39 MIL/uL — ABNORMAL LOW (ref 4.22–5.81)
RDW: 14.7 % (ref 11.5–15.5)
WBC: 9.2 10*3/uL (ref 4.0–10.5)
nRBC: 0 % (ref 0.0–0.2)

## 2021-03-02 LAB — COMPREHENSIVE METABOLIC PANEL
ALT: 20 U/L (ref 0–44)
AST: 21 U/L (ref 15–41)
Albumin: 3 g/dL — ABNORMAL LOW (ref 3.5–5.0)
Alkaline Phosphatase: 60 U/L (ref 38–126)
Anion gap: 11 (ref 5–15)
BUN: 42 mg/dL — ABNORMAL HIGH (ref 6–20)
CO2: 22 mmol/L (ref 22–32)
Calcium: 8.9 mg/dL (ref 8.9–10.3)
Chloride: 103 mmol/L (ref 98–111)
Creatinine, Ser: 2.62 mg/dL — ABNORMAL HIGH (ref 0.61–1.24)
GFR, Estimated: 28 mL/min — ABNORMAL LOW (ref >60)
Glucose, Bld: 97 mg/dL (ref 70–99)
Potassium: 5.3 mmol/L — ABNORMAL HIGH (ref 3.5–5.1)
Sodium: 136 mmol/L (ref 135–145)
Total Bilirubin: 0.3 mg/dL (ref 0.3–1.2)
Total Protein: 7.7 g/dL (ref 6.5–8.1)

## 2021-03-02 LAB — CREATININE, URINE, RANDOM: Creatinine, Urine: 76.92 mg/dL

## 2021-03-02 LAB — TYPE AND SCREEN
ABO/RH(D): B POS
Antibody Screen: NEGATIVE

## 2021-03-02 LAB — RESP PANEL BY RT-PCR (FLU A&B, COVID) ARPGX2
Influenza A by PCR: NEGATIVE
Influenza B by PCR: NEGATIVE
SARS Coronavirus 2 by RT PCR: NEGATIVE

## 2021-03-02 LAB — LACTIC ACID, PLASMA: Lactic Acid, Venous: 1.1 mmol/L (ref 0.5–1.9)

## 2021-03-02 LAB — SODIUM, URINE, RANDOM: Sodium, Ur: 92 mmol/L

## 2021-03-02 MED ORDER — ZINC OXIDE 12.8 % EX OINT
TOPICAL_OINTMENT | CUTANEOUS | Status: DC | PRN
  Filled 2021-03-02: qty 56.7

## 2021-03-02 MED ORDER — ENOXAPARIN SODIUM 40 MG/0.4ML IJ SOSY: 40.0000 mg | PREFILLED_SYRINGE | INTRAMUSCULAR | Status: DC

## 2021-03-02 MED ORDER — ENOXAPARIN SODIUM 80 MG/0.8ML IJ SOSY
70.0000 mg | PREFILLED_SYRINGE | INTRAMUSCULAR | Status: DC
  Administered 2021-03-03: 70 mg via SUBCUTANEOUS
  Filled 2021-03-02: qty 0.8

## 2021-03-02 MED ORDER — FLUTICASONE PROPIONATE 50 MCG/ACT NA SUSP
1.0000 | Freq: Every day | NASAL | Status: DC
  Administered 2021-03-03: 1 via NASAL
  Filled 2021-03-02: qty 16

## 2021-03-02 MED ORDER — ACETAMINOPHEN 325 MG PO TABS: 650.0000 mg | ORAL_TABLET | Freq: Four times a day (QID) | ORAL | Status: DC | PRN

## 2021-03-02 MED ORDER — CHLORHEXIDINE GLUCONATE CLOTH 2 % EX PADS
6.0000 | MEDICATED_PAD | Freq: Every day | CUTANEOUS | Status: DC
  Administered 2021-03-03: 6 via TOPICAL

## 2021-03-02 MED ORDER — SODIUM CHLORIDE 0.9 % IV BOLUS
500.0000 mL | Freq: Once | INTRAVENOUS | Status: AC
  Administered 2021-03-02: 500 mL via INTRAVENOUS

## 2021-03-02 MED ORDER — ACETAMINOPHEN 650 MG RE SUPP: 650.0000 mg | Freq: Four times a day (QID) | RECTAL | Status: DC | PRN

## 2021-03-02 MED ORDER — METHADONE HCL 10 MG PO TABS
75.0000 mg | ORAL_TABLET | Freq: Every day | ORAL | Status: DC
  Administered 2021-03-03: 75 mg via ORAL
  Filled 2021-03-02: qty 7

## 2021-03-02 MED ORDER — SODIUM CHLORIDE 0.9% FLUSH
3.0000 mL | Freq: Two times a day (BID) | INTRAVENOUS | Status: DC
  Administered 2021-03-02 – 2021-03-03 (×2): 3 mL via INTRAVENOUS
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
## 2021-03-02 NOTE — ED Triage Notes (Signed)
Pt bib gems c/o moderate genital bleeding coming from under his testicles starting at 0500. Pt arrives with indwelling foley that was inserted approx 16 months ago. Colostomy also in place. Pt not complaining of any pain or other symptoms.   HR: 70  BP: 116 palp  CBG: 108  Spo2: 96%
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
## 2021-03-02 NOTE — Progress Notes (Signed)
Attempted to place pt on CPAP but pt stated it "doesn't feel right" and he "feels like he is choking". He stated he would call someone to bring home machine.
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
## 2021-03-03 DIAGNOSIS — G8220 Paraplegia, unspecified: Secondary | ICD-10-CM | POA: Diagnosis not present

## 2021-03-03 DIAGNOSIS — F1190 Opioid use, unspecified, uncomplicated: Secondary | ICD-10-CM

## 2021-03-03 DIAGNOSIS — N179 Acute kidney failure, unspecified: Secondary | ICD-10-CM | POA: Diagnosis not present

## 2021-03-03 DIAGNOSIS — S3130XD Unspecified open wound of scrotum and testes, subsequent encounter: Secondary | ICD-10-CM | POA: Diagnosis not present

## 2021-03-03 DIAGNOSIS — N189 Chronic kidney disease, unspecified: Secondary | ICD-10-CM

## 2021-03-03 LAB — BASIC METABOLIC PANEL
Anion gap: 5 (ref 5–15)
Anion gap: 6 (ref 5–15)
BUN: 36 mg/dL — ABNORMAL HIGH (ref 6–20)
BUN: 30 mg/dL — ABNORMAL HIGH (ref 6–20)
CO2: 25 mmol/L (ref 22–32)
CO2: 25 mmol/L (ref 22–32)
Calcium: 8.7 mg/dL — ABNORMAL LOW (ref 8.9–10.3)
Calcium: 9 mg/dL (ref 8.9–10.3)
Chloride: 107 mmol/L (ref 98–111)
Chloride: 107 mmol/L (ref 98–111)
Creatinine, Ser: 1.99 mg/dL — ABNORMAL HIGH (ref 0.61–1.24)
Creatinine, Ser: 1.82 mg/dL — ABNORMAL HIGH (ref 0.61–1.24)
GFR, Estimated: 38 mL/min — ABNORMAL LOW (ref >60)
GFR, Estimated: 43 mL/min — ABNORMAL LOW (ref >60)
Glucose, Bld: 107 mg/dL — ABNORMAL HIGH (ref 70–99)
Glucose, Bld: 129 mg/dL — ABNORMAL HIGH (ref 70–99)
Potassium: 5.8 mmol/L — ABNORMAL HIGH (ref 3.5–5.1)
Potassium: 5.6 mmol/L — ABNORMAL HIGH (ref 3.5–5.1)
Sodium: 137 mmol/L (ref 135–145)
Sodium: 138 mmol/L (ref 135–145)

## 2021-03-03 LAB — CBC
HCT: 28.1 % — ABNORMAL LOW (ref 39.0–52.0)
Hemoglobin: 8.4 g/dL — ABNORMAL LOW (ref 13.0–17.0)
MCH: 27 pg (ref 26.0–34.0)
MCHC: 29.9 g/dL — ABNORMAL LOW (ref 30.0–36.0)
MCV: 90.4 fL (ref 80.0–100.0)
Platelets: 326 10*3/uL (ref 150–400)
RBC: 3.11 MIL/uL — ABNORMAL LOW (ref 4.22–5.81)
RDW: 14.6 % (ref 11.5–15.5)
WBC: 8.4 10*3/uL (ref 4.0–10.5)
nRBC: 0 % (ref 0.0–0.2)

## 2021-03-03 LAB — GLUCOSE, CAPILLARY: Glucose-Capillary: 93 mg/dL (ref 70–99)

## 2021-03-03 MED ORDER — FAMOTIDINE 20 MG PO TABS
40.0000 mg | ORAL_TABLET | Freq: Two times a day (BID) | ORAL | Status: DC
  Administered 2021-03-03 (×2): 40 mg via ORAL
  Filled 2021-03-03 (×2): qty 2

## 2021-03-03 MED ORDER — SODIUM ZIRCONIUM CYCLOSILICATE 10 G PO PACK
10.0000 g | PACK | Freq: Once | ORAL | Status: AC
  Administered 2021-03-03: 10 g via ORAL
  Filled 2021-03-03: qty 1

## 2021-03-03 NOTE — Discharge Instructions (Signed)
Mr. Cooksey, thank you for allowing [place] to care for you during her hospitalization.  You were found to have splitting of your penis, this was thought to be due to constant rubbing of your catheter.  You were seen by the urologist, the genital specialist, recommended you follow-up in their clinic.  They schedule you an appointment to be seen on June 7 at 1030.  Please do not miss this appointment.  We would also like to see you in clinic within the next week or so for repeat labs.  You had a high potassium level and we will give you medicine to lower this.  While no surgical intervention was recommended at this time, I do recommend that you keep a close eye on this area and if you notice any drainage or increased bleeding to have it evaluated immediately.  Please try to keep the area as clean as possible.  If you feel as though you are having an infection and having symptoms of this such as fevers, chills, worsening pain, please call your doctor or go to the closest emergency department if you feel as though you are having a medical emergency.  Dr. Raliegh Ip
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
## 2021-03-03 NOTE — TOC Initial Note (Addendum)
Transition of Care [facility]) - Initial/Assessment Note    Patient Details  Name: [name] MRN: [id number] Date of Birth: [birth year]  Transition of [facility]) CM/SW Contact:    Verdell Carmine, RN Phone Number: 03/03/2021, 12:36 PM  Clinical Narrative:                 Patient admitted for AKI, wounds. Has chronic wounds, is paraplegic, has aides come to care for him.                                                                                                                                                  WOC saw him and made recommendations.  Is not currently with a HH servie other than aides Has PP listed, chronic indwelling foley per urology. CM will follow for needs.   52 MD stated he ordered [facility] RN for DC previously had [facility] in 2019 called to speak to patient, busy at present.   [facility] [place] ordered for patient patient has been with [name] and [facility] in past. He wanted to try both of those first and then anyone else who will work with his insurance. He stated he had equipment and aide. Need to go home via [facility]< he has his key to get in house.  Medical necessity form started.  [facility], [facility],, [facility] cannot take. Contacted [facility].   2230: [facility] cannot take patient will call [facility]  1600 Nanine Means is reviewing  For [facility] Expected Discharge Plan: [place] Barriers to Discharge: Continued Medical Work up   Patient Goals and CMS Choice     Choice offered to / list presented to : Patient  Expected Discharge Plan and Services Expected Discharge Plan: [place]   Discharge Planning Services: CM Consult   Living arrangements for the past 2 months: Apartment                                      Prior Living Arrangements/Services Living arrangements for the past 2 months: Apartment Lives with:: Self Patient language and need for interpreter reviewed:: Yes        Need for Family Participation in Patient Care: Yes  (Comment) Care giver support system in place?: Yes (comment) Current home services: Homehealth aide Criminal Activity/Legal Involvement Pertinent to Current Situation/Hospitalization: No - Comment as needed  Activities of Daily Living      Permission Sought/Granted                  Emotional Assessment       Orientation: : Oriented to Situation,Oriented to  Time,Oriented to Place,Oriented to Self Alcohol / Substance Use: Not Applicable Psych Involvement: No (comment)  Admission diagnosis:  AKI (acute kidney injury) ([place]) [N17.9] Acute  kidney injury ([place]) [N17.9] Bleeding from wound [T14.8XXA] Pressure injury of skin of buttock, unspecified injury stage, unspecified laterality [L89.309] Patient Active Problem List   Diagnosis Date Noted  . AKI (acute kidney injury) ([place]) 03/02/2021  . Obstructive sleep apnea 02/24/2021  . Anemia of chronic disease 02/24/2021  . GERD (gastroesophageal reflux disease) 02/24/2021  . Dyspnea 02/15/2021  . Acute on chronic respiratory failure ([place]) 02/15/2021  . Sepsis ([place]) 11/12/2020  . Encephalopathy   . Pneumonia of both lungs due to infectious organism   . ARF (acute renal failure) ([place]) 07/09/2019  . Acute metabolic encephalopathy 80/99/8338  . Polysubstance abuse ([place]) 07/09/2019  . Substance induced mood disorder ([place]) 08/21/2018  . Acute kidney injury ([place]) 08/13/2018  . Altered mental status 02/17/2018  . Acute lower UTI 02/17/2018  . Vomiting feces   . Constipation 02/14/2017  . Colitis 02/13/2017  . Pressure injury of skin 02/13/2017  . Fecal impaction ([place]) 12/07/2016  . Cocaine abuse ([place])   . Decubitus ulcer of sacral region, stage 4 ([place])   . Urinary tract infection associated with indwelling urethral catheter ([place])   . Enteritis due to Clostridium difficile   . Abdominal pain 07/19/2016  . Osteomyelitis with necrosis of sacrum  06/01/2016  . Colostomy in place for fecal diversion 06/01/2016  . Protein-calorie  malnutrition, severe ([place]) 05/31/2016  . Dehydration 05/26/2016  . Chronic pain 05/26/2016  . Leukocytosis 05/26/2016  . Wound infection 05/26/2016  . Normocytic anemia 05/26/2016  . Hypertension 05/26/2016  . Paraplegia [facility])    PCP:  [name], MD Pharmacy:   [facility] [address] [place] [address] Phone: [phone] Fax: [place], [place] - [address] [place] [address] Phone: [phone] Fax: [phone]     Social Determinants of Health (SDOH) Interventions    Readmission Risk Interventions No flowsheet data found.

## 2021-03-03 NOTE — Care Management (Signed)
[facility] called will be there between 1.5 and 2 hours
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
## 2021-03-04 ENCOUNTER — Telehealth: Payer: Self-pay | Admitting: Student

## 2021-03-04 NOTE — Telephone Encounter (Signed)
Hosp F/U per Dr Beryle Beams; pt 06/08 245pm

## 2021-03-04 NOTE — Care Management (Signed)
[facility] has no RN availability to take patient

## 2021-03-05 NOTE — Progress Notes (Signed)
Internal Medicine Clinic Attending  Case discussed with Dr. Katsadouros  At the time of the visit.  We reviewed the resident's history and exam and pertinent patient test results.  I agree with the assessment, diagnosis, and plan of care documented in the resident's note.  

## 2021-03-07 NOTE — Telephone Encounter (Signed)
Transition Care Management Follow-up Telephone Call  Date of discharge and from where: Discharged 03/03/21 from the hospital.  How have you been since you were released from the hospital? Stated he has been ok.  Any questions or concerns? He asked about changing his catheter.  He wanted to know if he has an appt tomorrow 6/7-per discharge summary, pt informed he has an appt with [facility] tomorrow@ 1030 Am.  Items Reviewed:  Did the pt receive and understand the discharge instructions provided? Yes   Medications obtained and verified? Yes   Any new allergies since your discharge? No   Dietary orders reviewed? Yes Do you have support at home? He lives alone; he has an Engineer, production.  Home Care and Equipment/Supplies: Were home health services ordered?  Stated he has every thing he needs. He has oxygen via [employer], w/c, CPAP,hospital bed.  Functional Questionnaire: (I = Independent and D = Dependent) ADLs: He needs assistance; he has an Aide.  Bathing/Dressing- Needs assistance.  Meal Prep- Needs assistance.  Eating- I  Maintaining continence-  Needs assistance; he has a foley and ostomy.  Transferring/Ambulation- Paraplegic; w/c bound.  Managing Meds- I  Follow up appointments reviewed:   PCP Hospital f/u appt confirmed? Yes  Scheduled to see [name] on 03/09/21  @ 1545 PM. Pt reminded.   Are transportation arrangements needed?  No  If their condition worsens, is the pt aware to call PCP or go to the Emergency Dept.? Yes  Was the patient provided with contact information for the PCP's office or ED? Yes  Was to pt encouraged to call back with questions or concerns? Yes

## 2021-03-09 ENCOUNTER — Encounter (HOSPITAL_BASED_OUTPATIENT_CLINIC_OR_DEPARTMENT_OTHER): Payer: Medicare Other | Admitting: Physician Assistant

## 2021-03-09 ENCOUNTER — Encounter: Payer: Medicare Other | Admitting: Student

## 2021-03-09 ENCOUNTER — Encounter (HOSPITAL_BASED_OUTPATIENT_CLINIC_OR_DEPARTMENT_OTHER): Payer: Self-pay

## 2021-03-09 ENCOUNTER — Encounter: Payer: Self-pay | Admitting: Student

## 2021-03-09 ENCOUNTER — Other Ambulatory Visit: Payer: Self-pay

## 2021-03-10 ENCOUNTER — Telehealth: Payer: Self-pay | Admitting: Student

## 2021-03-10 NOTE — Telephone Encounter (Signed)
Called patient and LVM advising I was calling from [facility] in regards to his appointment with [place] for tomorrow 6/10 at 2:10pm. Advised that the provider had determined that this appointment would be done virtually, he does not need to come into the office. Advised Zelda will call his number at 2:10pm for his appointment. Advised patient to call [phone] with any questions/concerns.

## 2021-03-11 ENCOUNTER — Other Ambulatory Visit: Payer: Self-pay

## 2021-03-11 ENCOUNTER — Ambulatory Visit: Payer: Medicare Other | Admitting: Nurse Practitioner

## 2021-03-13 ENCOUNTER — Telehealth: Payer: Self-pay

## 2021-03-13 NOTE — Telephone Encounter (Signed)
Called [facility], cannot take patient. Continue to attempt to find RN for patient from hospitalization. Called [name] and updated him on Rn search. He only goes to wound clinic once a month. He needs RN to do dressing changes. And check on wound more frequently. Will call office on Monday to see if they can assist in finding RN assistance.

## 2021-03-17 ENCOUNTER — Telehealth: Payer: Self-pay

## 2021-03-17 ENCOUNTER — Telehealth: Payer: Self-pay | Admitting: *Deleted

## 2021-03-17 DIAGNOSIS — L89309 Pressure ulcer of unspecified buttock, unspecified stage: Secondary | ICD-10-CM

## 2021-03-17 NOTE — Telephone Encounter (Signed)
Colletta Maryland, CM with [facility], called requesting referral to the [facility] for this patient. States she has been trying to get [facility] RN for 2 weeks without success 2/2 patient's insurance, lack of RN availability, and the fact that these are older wounds.

## 2021-03-17 NOTE — Telephone Encounter (Signed)
Called MD office to let them know that we cannot find RN for wound care. Patient needs to go more often to wound center and needs orders. Patient is aware that I am calling office .  Will continue trying to reach office for orders called Tuesday and Today

## 2021-03-17 NOTE — Telephone Encounter (Signed)
Got through to office through Hospital operator spoke with [place], about issues with obtaining RN, NO staffing, insurance and old wounds, etc. Suggested that their CSW take over, or they look at sending him to wound clinic 2-3 times a week. She will notify MD and work on this, also notify patient.

## 2021-03-23 ENCOUNTER — Other Ambulatory Visit: Payer: Self-pay

## 2021-03-23 ENCOUNTER — Encounter (HOSPITAL_BASED_OUTPATIENT_CLINIC_OR_DEPARTMENT_OTHER): Payer: Medicare Other | Attending: Physician Assistant | Admitting: Physician Assistant

## 2021-03-23 DIAGNOSIS — I10 Essential (primary) hypertension: Secondary | ICD-10-CM | POA: Insufficient documentation

## 2021-03-23 DIAGNOSIS — G8222 Paraplegia, incomplete: Secondary | ICD-10-CM | POA: Insufficient documentation

## 2021-03-23 DIAGNOSIS — L89892 Pressure ulcer of other site, stage 2: Secondary | ICD-10-CM | POA: Diagnosis not present

## 2021-03-23 DIAGNOSIS — E46 Unspecified protein-calorie malnutrition: Secondary | ICD-10-CM | POA: Insufficient documentation

## 2021-03-23 DIAGNOSIS — G894 Chronic pain syndrome: Secondary | ICD-10-CM | POA: Diagnosis not present

## 2021-03-23 DIAGNOSIS — L89314 Pressure ulcer of right buttock, stage 4: Secondary | ICD-10-CM | POA: Insufficient documentation

## 2021-03-23 DIAGNOSIS — F17218 Nicotine dependence, cigarettes, with other nicotine-induced disorders: Secondary | ICD-10-CM | POA: Diagnosis not present

## 2021-03-23 DIAGNOSIS — L89893 Pressure ulcer of other site, stage 3: Secondary | ICD-10-CM | POA: Insufficient documentation

## 2021-03-23 DIAGNOSIS — L89154 Pressure ulcer of sacral region, stage 4: Secondary | ICD-10-CM | POA: Diagnosis not present

## 2021-03-23 DIAGNOSIS — Z6829 Body mass index (BMI) 29.0-29.9, adult: Secondary | ICD-10-CM | POA: Diagnosis not present

## 2021-03-23 DIAGNOSIS — Z933 Colostomy status: Secondary | ICD-10-CM | POA: Diagnosis not present

## 2021-03-23 DIAGNOSIS — L89324 Pressure ulcer of left buttock, stage 4: Secondary | ICD-10-CM | POA: Diagnosis present

## 2021-03-23 NOTE — Progress Notes (Addendum)
[name] ([id number]) Visit Report for 03/23/2021 Arrival Information Details Patient Name: Date of Service: CA RA [name] [name] 03/23/2021 9:45 A M Medical Record Number: [id number] Patient Account Number: [id number] Date of Birth/Sex: Treating RN: Feb 26, 1966 (55 y.o. [name] Primary Care Ketrina Boateng: [name] Other Clinician: Referring Joene Gelder: Treating Grey Schlauch/Extender: Lenox Ahr in Treatment: 83 Visit Information History Since Last Visit Added or deleted any medications: No Patient Arrived: Wheel Chair Any new allergies or adverse reactions: No Arrival Time: 10:21 Had a fall or experienced change in No Accompanied By: Caregiver activities of daily living that may affect Transfer Assistance: Manual risk of falls: Secondary Verification Process Completed: Yes Signs or symptoms of abuse/neglect since last visito No Patient Requires Transmission-Based Precautions: No Hospitalized since last visit: No Patient Has Alerts: No Implantable device outside of the clinic excluding No cellular tissue based products placed in the center since last visit: Has Dressing in Place as Prescribed: Yes Pain Present Now: Yes Electronic Signature(s) Signed: 03/23/2021 5:44:30 PM By: Lorrin Jackson Entered By: Lorrin Jackson on 03/23/2021 10:36:18 -------------------------------------------------------------------------------- Clinic Level of Care Assessment Details Patient Name: Date of Service: CA [name] 03/23/2021 9:45 A M Medical Record Number: [id number] Patient Account Number: [id number] Date of Birth/Sex: Treating RN: 05-08-1966 (56 y.o. [name] Primary Care Alys Dulak: [name] Other Clinician: Referring Jaziah Kwasnik: Treating Rilley Stash/Extender: Lenox Ahr in Treatment: 190 Clinic Level of Care Assessment Items TOOL 4 Quantity Score []  - 0 Use when only an EandM is performed on  FOLLOW-UP visit ASSESSMENTS - Nursing Assessment / Reassessment X- 1 10 Reassessment of Co-morbidities (includes updates in patient status) X- 1 5 Reassessment of Adherence to Treatment Plan ASSESSMENTS - Wound and Skin A ssessment / Reassessment []  - 0 Simple Wound Assessment / Reassessment - one wound X- 4 5 Complex Wound Assessment / Reassessment - multiple wounds []  - 0 Dermatologic / Skin Assessment (not related to wound area) ASSESSMENTS - Focused Assessment []  - 0 Circumferential Edema Measurements - multi extremities []  - 0 Nutritional Assessment / Counseling / Intervention []  - 0 Lower Extremity Assessment (monofilament, tuning fork, pulses) []  - 0 Peripheral Arterial Disease Assessment (using hand held doppler) ASSESSMENTS - Ostomy and/or Continence Assessment and Care []  - 0 Incontinence Assessment and Management []  - 0 Ostomy Care Assessment and Management (repouching, etc.) PROCESS - Coordination of Care X - Simple Patient / Family Education for ongoing care 1 15 []  - 0 Complex (extensive) Patient / Family Education for ongoing care X- 1 10 Staff obtains Programmer, systems, Records, T Results / Process Orders est []  - 0 Staff telephones HHA, Nursing Homes / Clarify orders / etc []  - 0 Routine Transfer to another Facility (non-emergent condition) []  - 0 Routine Hospital Admission (non-emergent condition) []  - 0 New Admissions / Biomedical engineer / Ordering NPWT Apligraf, etc. , []  - 0 Emergency Hospital Admission (emergent condition) X- 1 10 Simple Discharge Coordination []  - 0 Complex (extensive) Discharge Coordination PROCESS - Special Needs []  - 0 Pediatric / Minor Patient Management []  - 0 Isolation Patient Management []  - 0 Hearing / Language / Visual special needs []  - 0 Assessment of Community assistance (transportation, D/C planning, etc.) []  - 0 Additional assistance / Altered mentation []  - 0 Support Surface(s) Assessment (bed, cushion,  seat, etc.) INTERVENTIONS - Wound Cleansing / Measurement []  - 0 Simple Wound Cleansing - one wound X- 4 5 Complex Wound Cleansing - multiple wounds X- 1  5 Wound Imaging (photographs - any number of wounds) []  - 0 Wound Tracing (instead of photographs) []  - 0 Simple Wound Measurement - one wound X- 4 5 Complex Wound Measurement - multiple wounds INTERVENTIONS - Wound Dressings X - Small Wound Dressing one or multiple wounds 4 10 []  - 0 Medium Wound Dressing one or multiple wounds []  - 0 Large Wound Dressing one or multiple wounds X- 1 5 Application of Medications - topical []  - 0 Application of Medications - injection INTERVENTIONS - Miscellaneous []  - 0 External ear exam []  - 0 Specimen Collection (cultures, biopsies, blood, body fluids, etc.) []  - 0 Specimen(s) / Culture(s) sent or taken to Lab for analysis []  - 0 Patient Transfer (multiple staff / Civil Service fast streamer / Similar devices) []  - 0 Simple Staple / Suture removal (25 or less) []  - 0 Complex Staple / Suture removal (26 or more) []  - 0 Hypo / Hyperglycemic Management (close monitor of Blood Glucose) []  - 0 Ankle / Brachial Index (ABI) - do not check if billed separately X- 1 5 Vital Signs Has the patient been seen at the hospital within the last three years: Yes Total Score: 165 Level Of Care: New/Established - Level 5 Electronic Signature(s) Signed: 03/23/2021 5:44:00 PM By: Baruch Gouty RN, BSN Entered By: Baruch Gouty on 03/23/2021 10:44:47 -------------------------------------------------------------------------------- Lower Extremity Assessment Details Patient Name: Date of Service: CA RA [name], [name] 03/23/2021 9:45 A M Medical Record Number: [id number] Patient Account Number: [id number] Date of Birth/Sex: Treating RN: [birth year] (55 y.o. [name] Primary Care Elaisha Zahniser: [name] Other Clinician: Referring Ysmael Hires: Treating Emon Miggins/Extender: Lenox Ahr in Treatment: 190 Electronic Signature(s) Signed: 03/23/2021 5:44:30 PM By: Lorrin Jackson Entered By: Lorrin Jackson on 03/23/2021 10:24:15 -------------------------------------------------------------------------------- Multi Wound Chart Details Patient Name: Date of Service: CA RA [place], [name] 03/23/2021 9:45 A M Medical Record Number: [id number] Patient Account Number: [id number] Date of Birth/Sex: Treating RN: [birth year] (55 y.o. [name] Primary Care Janis Sol: [name] Other Clinician: Referring Bernardine Langworthy: Treating Jago Carton/Extender: Lenox Ahr in Treatment: 190 Vital Signs Height(in): 72 Pulse(bpm): 80 Weight(lbs): 215 Blood Pressure(mmHg): 128/72 Body Mass Index(BMI): 29 Temperature(F): 98.3 Respiratory Rate(breaths/min): 20 Photos: Sacrum Right, Medial Ischium Left Ischium Wound Location: Pressure Injury Gradually Appeared Gradually Appeared Wounding Event: Pressure Ulcer Pressure Ulcer Pressure Ulcer Primary Etiology: Anemia, Hypertension, Colitis, Anemia, Hypertension, Colitis, Anemia, Hypertension, Colitis, Comorbid History: Osteomyelitis, Paraplegia Osteomyelitis, Paraplegia Osteomyelitis, Paraplegia 10/02/2013 10/02/2012 10/02/2013 Date Acquired: 190 155 155 Weeks of Treatment: Open Open Open Wound Status: No No No Clustered Wound: N/A N/A N/A Clustered Quantity: 0.7x0.8x0.6 0.9x0.3x2.6 2.3x1x2.7 Measurements L x W x D (cm) 0.44 0.212 1.806 A (cm) : rea 0.264 0.551 4.877 Volume (cm) : 79.40% 92.50% -325.90% % Reduction in A rea: 90.50% 90.30% -310.50% % Reduction in Volume: Category/Stage IV Category/Stage II Category/Stage II Classification: Medium Small Medium Exudate A mount: Serosanguineous Serosanguineous Serosanguineous Exudate Type: red, brown red, brown red, brown Exudate Color: Fibrotic scar, thickened scar Well defined, not attached Epibole Wound Margin: Large (67-100%)  Large (67-100%) Large (67-100%) Granulation A mount: Pink, Pale Pink Pink, Pale Granulation Quality: None Present (0%) None Present (0%) None Present (0%) Necrotic A mount: Fat Layer (Subcutaneous Tissue): Yes Fat Layer (Subcutaneous Tissue): Yes Fat Layer (Subcutaneous Tissue): Yes Exposed Structures: Fascia: No Fascia: No Fascia: No Tendon: No Tendon: No Tendon: No Muscle: No Muscle: No Muscle: No Joint: No Joint: No Joint: No Bone: No Bone: No Bone: No  Medium (34-66%) Large (67-100%) Small (1-33%) Epithelialization: Wound Number: 27 N/A N/A Photos: N/A N/A Scrotum N/A N/A Wound Location: Pressure Injury N/A N/A Wounding Event: Pressure Ulcer N/A N/A Primary Etiology: Anemia, Hypertension, Colitis, N/A N/A Comorbid History: Osteomyelitis, Paraplegia 02/23/2021 N/A N/A Date Acquired: 4 N/A N/A Weeks of Treatment: Open N/A N/A Wound Status: Yes N/A N/A Clustered Wound: 5 N/A N/A Clustered Quantity: 6.5x3.5x0.1 N/A N/A Measurements L x W x D (cm) 17.868 N/A N/A A (cm) : rea 1.787 N/A N/A Volume (cm) : 71.70% N/A N/A % Reduction in A rea: 71.70% N/A N/A % Reduction in Volume: Category/Stage III N/A N/A Classification: Medium N/A N/A Exudate A mount: Serosanguineous N/A N/A Exudate Type: red, brown N/A N/A Exudate Color: Distinct, outline attached N/A N/A Wound Margin: Large (67-100%) N/A N/A Granulation A mount: Red, Pink, Friable N/A N/A Granulation Quality: None Present (0%) N/A N/A Necrotic A mount: Fat Layer (Subcutaneous Tissue): Yes N/A N/A Exposed Structures: Fascia: No Tendon: No Muscle: No Joint: No Bone: No None N/A N/A Epithelialization: Treatment Notes Wound #19 (Sacrum) Cleanser Peri-Wound Care Topical Primary Dressing KerraCel Ag Gelling Fiber Dressing, 4x5 in (silver alginate) Discharge Instruction: Apply silver alginate to wound bed, do not pack, just tuck lightly into wounds as instructed Secondary Dressing ABD  Pad, 8x10 Discharge Instruction: Apply over primary dressing as directed. Secured With 69M Medipore H Soft Cloth Surgical T 4 x 2 (in/yd) ape Discharge Instruction: Secure dressing with tape as directed. Compression Wrap Compression Stockings Add-Ons Wound #21 (Ischium) Wound Laterality: Right, Medial Cleanser Peri-Wound Care Topical Primary Dressing KerraCel Ag Gelling Fiber Dressing, 4x5 in (silver alginate) Discharge Instruction: Apply silver alginate to wound bed, do not pack, just tuck lightly into wounds as instructed Secondary Dressing ABD Pad, 8x10 Discharge Instruction: Apply over primary dressing as directed. Secured With 69M Medipore H Soft Cloth Surgical T 4 x 2 (in/yd) ape Discharge Instruction: Secure dressing with tape as directed. Compression Wrap Compression Stockings Add-Ons Wound #22 (Ischium) Wound Laterality: Left Cleanser Peri-Wound Care Topical Primary Dressing KerraCel Ag Gelling Fiber Dressing, 4x5 in (silver alginate) Discharge Instruction: Apply silver alginate to wound bed, do not pack, just tuck lightly into wounds as instructed Secondary Dressing ABD Pad, 8x10 Discharge Instruction: Apply over primary dressing as directed. Secured With 69M Medipore H Soft Cloth Surgical T 4 x 2 (in/yd) ape Discharge Instruction: Secure dressing with tape as directed. Compression Wrap Compression Stockings Add-Ons Wound #27 (Scrotum) Cleanser Peri-Wound Care Zinc Oxide Ointment 30g tube Discharge Instruction: Apply Zinc Oxide to periwound with each dressing change Topical Primary Dressing KerraCel Ag Gelling Fiber Dressing, 4x5 in (silver alginate) Discharge Instruction: Apply silver alginate to wound bed as instructed Secondary Dressing ABD Pad, 8x10 Discharge Instruction: Apply over primary dressing, may change daily to keep dry Secured With Compression Wrap Compression Stockings Add-Ons Electronic Signature(s) Signed: 06/30/2021 3:07:54 PM By:  Baruch Gouty RN, BSN Entered By: Baruch Gouty on 06/29/2021 17:23:08 -------------------------------------------------------------------------------- [place] Details Patient Name: Date of Service: CA RA [place], [name] 03/23/2021 9:45 A M Medical Record Number: [id number] Patient Account Number: [id number] Date of Birth/Sex: Treating RN: [birth year] (55 y.o. [name] Primary Care Aasia Peavler: [name] Other Clinician: Referring Rayfield Beem: Treating Jadrian Bulman/Extender: Lenox Ahr in Treatment: [place] reviewed with physician Active Inactive Pressure Nursing Diagnoses: Knowledge deficit related to causes and risk factors for pressure ulcer development Knowledge deficit related to management of pressures ulcers Potential for impaired tissue integrity related to  pressure, friction, moisture, and shear Goals: Patient will remain free from development of additional pressure ulcers Date Initiated: 12/12/2017 Date Inactivated: 08/14/2018 Target Resolution Date: 08/21/2018 Goal Status: Unmet Unmet Reason: hospitalized Patient will remain free of pressure ulcers Date Initiated: 12/12/2017 Date Inactivated: 02/06/2018 Target Resolution Date: 02/06/2018 Unmet Reason: pt noncompliant wth Goal Status: Unmet offloading Patient/caregiver will verbalize understanding of pressure ulcer management Date Initiated: 12/12/2017 Target Resolution Date: 04/20/2021 Goal Status: Active Interventions: Assess: immobility, friction, shearing, incontinence upon admission and as needed Assess offloading mechanisms upon admission and as needed Assess potential for pressure ulcer upon admission and as needed Notes: Wound/Skin Impairment Nursing Diagnoses: Impaired tissue integrity Goals: Patient/caregiver will verbalize understanding of skin care regimen Date Initiated: 03/06/2018 Target Resolution Date: 04/20/2021 Goal  Status: Active Ulcer/skin breakdown will heal within 14 weeks Date Initiated: 07/31/2017 Date Inactivated: 01/09/2018 Target Resolution Date: 11/30/2017 Unmet Reason: noncompliant with Goal Status: Unmet offloading Interventions: Assess patient/caregiver ability to perform ulcer/skin care regimen upon admission and as needed Notes: Electronic Signature(s) Signed: 03/23/2021 5:44:00 PM By: Baruch Gouty RN, BSN Entered By: Baruch Gouty on 03/23/2021 10:43:16 -------------------------------------------------------------------------------- Pain Assessment Details Patient Name: Date of Service: CA RA [place], [name] 03/23/2021 9:45 A M Medical Record Number: [id number] Patient Account Number: [id number] Date of Birth/Sex: Treating RN: 1965-11-16 (56 y.o. [name] Primary Care Tarron Krolak: [name] Other Clinician: Referring Evaline Waltman: Treating Excell Neyland/Extender: Lenox Ahr in Treatment: 190 Active Problems Location of Pain Severity and Description of Pain Patient Has Paino Yes Site Locations Rate the pain. Current Pain Level: 5 Character of Pain Describe the Pain: Burning, Tender Pain Management and Medication Current Pain Management: Medication: Yes Cold Application: No Rest: Yes Massage: No Activity: No T.E.N.S.: No Heat Application: No Leg drop or elevation: No Is the Current Pain Management Adequate: Inadequate How does your wound impact your activities of daily livingo Sleep: No Bathing: No Appetite: No Relationship With Others: No Bladder Continence: No Emotions: No Bowel Continence: No Work: No Toileting: No Drive: No Dressing: No Hobbies: No Electronic Signature(s) Signed: 03/23/2021 5:44:30 PM By: Lorrin Jackson Entered By: Lorrin Jackson on 03/23/2021 10:36:05 -------------------------------------------------------------------------------- Patient/Caregiver Education Details Patient Name: Date of  Service: CA RA [name], [name] 6/22/2022andnbsp9:45 A M Medical Record Number: [id number] Patient Account Number: [id number] Date of Birth/Gender: Treating RN: March 11, 1966 (55 y.o. [name] Primary Care Physician: [name] Other Clinician: Referring Physician: Treating Physician/Extender: Lenox Ahr in Treatment: [phone] Education Assessment Education Provided To: Patient Education Topics Provided Pressure: Methods: Explain/Verbal Responses: Reinforcements needed, State content correctly Wound/Skin Impairment: Methods: Explain/Verbal Responses: Reinforcements needed, State content correctly Electronic Signature(s) Signed: 03/23/2021 5:44:00 PM By: Baruch Gouty RN, BSN Entered By: Baruch Gouty on 03/23/2021 10:43:48 -------------------------------------------------------------------------------- Wound Assessment Details Patient Name: Date of Service: CA RA [place], [name] 03/23/2021 9:45 A M Medical Record Number: [id number] Patient Account Number: [id number] Date of Birth/Sex: Treating RN: Feb 10, 1966 (55 y.o. [name] Primary Care Zamirah Denny: [name] Other Clinician: Referring Bellarae Lizer: Treating Frazier Balfour/Extender: Lenox Ahr in Treatment: 190 Wound Status Wound Number: 19 Primary Etiology: Pressure Ulcer Wound Location: Sacrum Wound Status: Open Wounding Event: Pressure Injury Comorbid History: Anemia, Hypertension, Colitis, Osteomyelitis, Paraplegia Date Acquired: 10/02/2013 Weeks Of Treatment: 190 Clustered Wound: No Photos Wound Measurements Length: (cm) 0.7 Width: (cm) 0.8 Depth: (cm) 0.6 Area: (cm) 0.44 Volume: (cm) 0.264 % Reduction in Area: 79.4% % Reduction in Volume: 90.5% Epithelialization: Medium (34-66%) Tunneling: No Undermining: No Wound  Description Classification: Category/Stage IV Wound Margin: Fibrotic scar, thickened scar Exudate Amount:  Medium Exudate Type: Serosanguineous Exudate Color: red, brown Foul Odor After Cleansing: No Slough/Fibrino No Wound Bed Granulation Amount: Large (67-100%) Exposed Structure Granulation Quality: Pink, Pale Fascia Exposed: No Necrotic Amount: None Present (0%) Fat Layer (Subcutaneous Tissue) Exposed: Yes Tendon Exposed: No Muscle Exposed: No Joint Exposed: No Bone Exposed: No Electronic Signature(s) Signed: 03/23/2021 4:31:55 PM By: Sandre Kitty Signed: 03/23/2021 5:44:30 PM By: Lorrin Jackson Entered By: Sandre Kitty on 03/23/2021 16:12:34 -------------------------------------------------------------------------------- Wound Assessment Details Patient Name: Date of Service: [name], [name] 03/23/2021 9:45 A M Medical Record Number: [id number] Patient Account Number: [id number] Date of Birth/Sex: Treating RN: 07/03/1966 (55 y.o. [name] Primary Care Sudie Bandel: [name] Other Clinician: Referring Meara Wiechman: Treating Kendre Sires/Extender: Lenox Ahr in Treatment: 190 Wound Status Wound Number: 21 Primary Etiology: Pressure Ulcer Wound Location: Right, Medial Ischium Wound Status: Open Wounding Event: Gradually Appeared Comorbid History: Anemia, Hypertension, Colitis, Osteomyelitis, Paraplegia Date Acquired: 10/02/2012 Weeks Of Treatment: 155 Clustered Wound: No Photos Wound Measurements Length: (cm) 0.9 Width: (cm) 0.3 Depth: (cm) 2.6 Area: (cm) 0.212 Volume: (cm) 0.551 % Reduction in Area: 92.5% % Reduction in Volume: 90.3% Epithelialization: Large (67-100%) Tunneling: No Undermining: No Wound Description Classification: Category/Stage II Wound Margin: Well defined, not attached Exudate Amount: Small Exudate Type: Serosanguineous Exudate Color: red, brown Foul Odor After Cleansing: No Slough/Fibrino No Wound Bed Granulation Amount: Large (67-100%) Exposed Structure Granulation Quality: Pink Fascia  Exposed: No Necrotic Amount: None Present (0%) Fat Layer (Subcutaneous Tissue) Exposed: Yes Tendon Exposed: No Muscle Exposed: No Joint Exposed: No Bone Exposed: No Electronic Signature(s) Signed: 03/23/2021 4:31:55 PM By: Sandre Kitty Signed: 03/23/2021 5:44:30 PM By: Lorrin Jackson Entered By: Sandre Kitty on 03/23/2021 16:11:19 -------------------------------------------------------------------------------- Wound Assessment Details Patient Name: Date of Service: [name], [name] 03/23/2021 9:45 A M Medical Record Number: [id number] Patient Account Number: [id number] Date of Birth/Sex: Treating RN: December 01, 1965 (55 y.o. [name] Primary Care Ancel Easler: [name] Other Clinician: Referring Jazir Newey: Treating Alaine Loughney/Extender: Lenox Ahr in Treatment: 190 Wound Status Wound Number: 22 Primary Etiology: Pressure Ulcer Wound Location: Left Ischium Wound Status: Open Wounding Event: Gradually Appeared Comorbid History: Anemia, Hypertension, Colitis, Osteomyelitis, Paraplegia Date Acquired: 10/02/2013 Weeks Of Treatment: 155 Clustered Wound: No Photos Wound Measurements Length: (cm) 2.3 Width: (cm) 1 Depth: (cm) 2.7 Area: (cm) 1.806 Volume: (cm) 4.877 % Reduction in Area: -325.9% % Reduction in Volume: -310.5% Epithelialization: Small (1-33%) Tunneling: No Undermining: No Wound Description Classification: Category/Stage II Wound Margin: Epibole Exudate Amount: Medium Exudate Type: Serosanguineous Exudate Color: red, brown Foul Odor After Cleansing: No Slough/Fibrino No Wound Bed Granulation Amount: Large (67-100%) Exposed Structure Granulation Quality: Pink, Pale Fascia Exposed: No Necrotic Amount: None Present (0%) Fat Layer (Subcutaneous Tissue) Exposed: Yes Tendon Exposed: No Muscle Exposed: No Joint Exposed: No Bone Exposed: No Electronic Signature(s) Signed: 03/23/2021 4:31:55 PM By: Sandre Kitty Signed: 03/23/2021 5:44:30 PM By: Lorrin Jackson Entered By: Sandre Kitty on 03/23/2021 16:12:10 -------------------------------------------------------------------------------- Wound Assessment Details Patient Name: Date of Service: [name], [name] 03/23/2021 9:45 A M Medical Record Number: [id number] Patient Account Number: [id number] Date of Birth/Sex: Treating RN: 1966-08-22 (55 y.o. [name] Primary Care Nakai Pollio: [name] Other Clinician: Referring Brytni Dray: Treating Namrata Dangler/Extender: Lenox Ahr in Treatment: 190 Wound Status Wound Number: 27 Primary Etiology: Pressure Ulcer Wound Location: Scrotum Wound Status: Open Wounding Event: Pressure Injury  Comorbid History: Anemia, Hypertension, Colitis, Osteomyelitis, Paraplegia Date Acquired: 02/23/2021 Weeks Of Treatment: 4 Clustered Wound: Yes Photos Wound Measurements Length: (cm) 6.5 Width: (cm) 3.5 Depth: (cm) 0.1 Clustered Quantity: 5 Area: (cm) 17.868 Volume: (cm) 1.787 % Reduction in Area: 71.7% % Reduction in Volume: 71.7% Epithelialization: None Tunneling: No Undermining: No Wound Description Classification: Category/Stage III Wound Margin: Distinct, outline attached Exudate Amount: Medium Exudate Type: Serosanguineous Exudate Color: red, brown Foul Odor After Cleansing: No Slough/Fibrino No Wound Bed Granulation Amount: Large (67-100%) Exposed Structure Granulation Quality: Red, Pink, Friable Fascia Exposed: No Necrotic Amount: None Present (0%) Fat Layer (Subcutaneous Tissue) Exposed: Yes Tendon Exposed: No Muscle Exposed: No Joint Exposed: No Bone Exposed: No Electronic Signature(s) Signed: 03/23/2021 4:31:55 PM By: Sandre Kitty Signed: 03/23/2021 5:44:30 PM By: Lorrin Jackson Entered By: Sandre Kitty on 03/23/2021 16:11:44 -------------------------------------------------------------------------------- Vitals  Details Patient Name: Date of Service: [name], [name] 03/23/2021 9:45 A M Medical Record Number: [id number] Patient Account Number: [id number] Date of Birth/Sex: Treating RN: [birth year] (55 y.o. [name] Primary Care Jola Critzer: [name] Other Clinician: Referring Lorine Iannaccone: Treating Kalayah Leske/Extender: Lenox Ahr in Treatment: 190 Vital Signs Time Taken: 10:20 Temperature (F): 98.3 Height (in): 72 Pulse (bpm): 80 Weight (lbs): 215 Respiratory Rate (breaths/min): 20 Body Mass Index (BMI): 29.2 Blood Pressure (mmHg): 128/72 Reference Range: 80 - 120 mg / dl Electronic Signature(s) Signed: 03/23/2021 5:44:30 PM By: Lorrin Jackson Entered By: Lorrin Jackson on 03/23/2021 10:23:37

## 2021-03-23 NOTE — Progress Notes (Addendum)
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 better or worse. 08/04/2020 upon evaluation today patient actually appears to be doing quite well with regard to his wounds. I see nothing that appears to be worse at this time. Has been tolerating the dressing changes without  complication. Fortunately there is no signs of active infection at this time. No fevers, chills, nausea, vomiting, or diarrhea. 09/15/2020 on evaluation today patient appears to be doing well with regard to his wounds all things considered they are doing about the same. There is no signs of active infection at this time. No fevers, chills, nausea, vomiting, or diarrhea. 11/03/2020 upon evaluation today patient appears to be doing unfortunately not too great. He has gained quite a bit of weight and to be honest  this coupled with having issues with what he calls "tendinitis" in his elbows, knees, wrist, has caused issues with him being able to transfer he can no longer transfer himself. I do believe that this likely is also at least in part or if not in full related to significant weight gain even since I saw him last on December 15. I am not sure how much she has gained but he has gained quite a bit. He does have a bed that we did get replaced for him this is an air mattress which is alternating. With that being said unfortunately even with this I think he is mainly staying in bed all the time now he does not get up in his wheelchair much at all. Nonetheless I think that this is leading to even more issues from that standpoint as he is not moving as much as he was in the past. All of this is leading to him having some pretty significant issues at this point all of which I think are completely real but all of which I think may be at least propagated in part by his weight gain. He has not talked to his primary care provider yet about any of this. 12/29/2020 on evaluation today patient appears to be doing well with regard to his wounds all things considered. In fact on the right side gluteal region I really cannot find anything open at this point which is great news. Fortunately I think that he is not showing any signs of significant infection which is also great news. No fevers, chills, nausea, vomiting, or  diarrhea. 5/25; patient presents for 14-month follow-up. He reports he is using silver alginate to the wounds. He has no complaints or issues today except for that he has a new wound to his scrotum. He denies signs of infection today. 03/23/2021 upon evaluation today patient appears to be doing well with regard to his wounds in general. Fortunately there is no signs of active infection at this time which is great news. No fevers, chills, nausea, vomiting, or diarrhea. Objective Constitutional Obese and well-hydrated in no acute distress. Vitals Time Taken: 10:20 AM, Height: 72 in, Weight: 215 lbs, BMI: 29.2, Temperature: 98.3 F, Pulse: 80 bpm, Respiratory Rate: 20 breaths/min, Blood Pressure: 128/72 mmHg. Respiratory normal breathing without difficulty. Psychiatric this patient is able to make decisions and demonstrates good insight into disease process. Alert and Oriented x 3. pleasant and cooperative. General Notes: Upon inspection patient's wound bed actually showed signs of good granulation epithelization at this point. There does not appear to be any signs of active infection which is great and overall I am extremely pleased with where things stand today. No fevers, chills, nausea, vomiting, or diarrhea. Integumentary (Hair, Skin) Wound #19 status is Open. Original cause of wound was Pressure Injury. The date acquired was: 10/02/2013. The wound has been in treatment 190 weeks. The wound is located on the Sacrum. The wound measures 0.7cm length x 0.8cm width x 0.6cm depth; 0.44cm^2 area and 0.264cm^3 volume. There is Fat Layer (Subcutaneous Tissue) exposed. There is no tunneling or undermining noted. There is a medium amount of serosanguineous drainage noted. The wound margin is fibrotic, thickened scar. There is large (67-100%) pink, pale granulation within the wound bed. There is no necrotic tissue within the wound bed. Wound #21 status is Open. Original cause of wound was Gradually  Appeared. The date acquired was: 10/02/2012. The wound has been in treatment 155 weeks. The wound is located on the Right,Medial  Ischium. The wound measures 0.9cm length x 0.3cm width x 2.6cm depth; 0.212cm^2 area and 0.551cm^3 volume. There is Fat Layer (Subcutaneous Tissue) exposed. There is no tunneling or undermining noted. There is a small amount of serosanguineous drainage noted. The wound margin is well defined and not attached to the wound base. There is large (67-100%) pink granulation within the wound bed. There is no necrotic tissue within the wound bed. Wound #22 status is Open. Original cause of wound was Gradually Appeared. The date acquired was: 10/02/2013. The wound has been in treatment 155 weeks. The wound is located on the Left Ischium. The wound measures 2.3cm length x 1cm width x 2.7cm depth; 1.806cm^2 area and 4.877cm^3 volume. There is Fat Layer (Subcutaneous Tissue) exposed. There is no tunneling or undermining noted. There is a medium amount of serosanguineous drainage noted. The wound margin is epibole. There is large (67-100%) pink, pale granulation within the wound bed. There is no necrotic tissue within the wound bed. Wound #27 status is Open. Original cause of wound was Pressure Injury. The date acquired was: 02/23/2021. The wound has been in treatment 4 weeks. The wound is located on the Scrotum. The wound measures 6.5cm length x 3.5cm width x 0.1cm depth; 17.868cm^2 area and 1.787cm^3 volume. There is Fat Layer (Subcutaneous Tissue) exposed. There is no tunneling or undermining noted. There is a medium amount of serosanguineous drainage noted. The wound margin is distinct with the outline attached to the wound base. There is large (67-100%) red, pink, friable granulation within the wound bed. There is no necrotic tissue within the wound bed. Assessment Active Problems ICD-10 Pressure ulcer of left buttock, stage 4 Pressure ulcer of right buttock, stage 4 Pressure ulcer  of sacral region, stage 4 Pressure ulcer of other site, stage 2 Pressure ulcer of other site, stage 3 Paraplegia, incomplete Nicotine dependence, cigarettes, with other nicotine-induced disorders Abrasion of scrotum and testes, initial encounter Plan Follow-up Appointments: Return appointment in 1 month. - with [name] Shower/ Hygiene: May shower and wash wound with soap and water. Off-Loading: Low air-loss mattress (Group 2) Turn and reposition every 2 hours Other: - place folded towel under scrotum to elevate WOUND #19: - Sacrum Wound Laterality: Prim Dressing: KerraCel Ag Gelling Fiber Dressing, 4x5 in (silver alginate) ary Discharge Instructions: Apply silver alginate to wound bed, do not pack, just tuck lightly into wounds as instructed Secondary Dressing: ABD Pad, 8x10 Discharge Instructions: Apply over primary dressing as directed. Secured With: 78M Medipore H Soft Cloth Surgical T 4 x 2 (in/yd) ape Discharge Instructions: Secure dressing with tape as directed. WOUND #21: - Ischium Wound Laterality: Right, Medial Prim Dressing: KerraCel Ag Gelling Fiber Dressing, 4x5 in (silver alginate) ary Discharge Instructions: Apply silver alginate to wound bed, do not pack, just tuck lightly into wounds as instructed Secondary Dressing: ABD Pad, 8x10 Discharge Instructions: Apply over primary dressing as directed. Secured With: 78M Medipore H Soft Cloth Surgical T 4 x 2 (in/yd) ape Discharge Instructions: Secure dressing with tape as directed. WOUND #22: - Ischium Wound Laterality: Left Prim Dressing: KerraCel Ag Gelling Fiber Dressing, 4x5 in (silver alginate) ary Discharge Instructions: Apply silver alginate to wound bed, do not pack, just tuck lightly into wounds as instructed Secondary Dressing: ABD Pad, 8x10 Discharge Instructions: Apply over primary dressing as directed. Secured With: 78M Medipore H Soft Cloth Surgical T 4 x 2 (in/yd) ape Discharge Instructions: Secure  dressing with tape as directed. WOUND #27: - Scrotum Wound Laterality: Peri-Wound Care: Zinc  Oxide Ointment 30g tube Every Other Day/30 Days Discharge Instructions: Apply Zinc Oxide to periwound with each dressing change Prim Dressing: KerraCel Ag Gelling Fiber Dressing, 4x5 in (silver alginate) Every Other Day/30 Days ary Discharge Instructions: Apply silver alginate to wound bed as instructed Secondary Dressing: ABD Pad, 8x10 Every Other Day/30 Days Discharge Instructions: Apply over primary dressing, may change daily to keep dry 1. Would recommend that we going continue with the wound care measures as before and the patient is in agreement with plan. This includes the use of the silver alginate dressing which I think is doing a great job I wish he could packed some of this into the wound openings but again he tells me that it is much too painful for that. 2. I am also can recommend that we have the patient going continue to monitor for any signs of worsening or infection. Obviously if he develops any issues he should let me know soon as possible. We will see patient back for reevaluation in 1 month here in the clinic. If anything worsens or changes patient will contact our office for additional recommendations. Electronic Signature(s) Signed: 03/23/2021 10:53:23 AM By: [name] Keeler PA-C Entered By: [name] Keeler on 03/23/2021 10:53:23 -------------------------------------------------------------------------------- SuperBill Details Patient Name: Date of Service: CA RA [place], [name] 03/23/2021 Medical Record Number: [id number] Patient Account Number: [id number] Date of Birth/Sex: Treating RN: July 08, 1966 (55 y.o. [name] Primary Care Provider: [name] Other Clinician: Referring Provider: Treating Provider/Extender: Lenox Ahr in Treatment: 190 Diagnosis Coding ICD-10 Codes Code Description [phone] Pressure ulcer of left buttock,  stage 4 L89.314 Pressure ulcer of right buttock, stage 4 L89.154 Pressure ulcer of sacral region, stage 4 L89.892 Pressure ulcer of other site, stage 2 L89.893 Pressure ulcer of other site, stage 3 G82.22 Paraplegia, incomplete F17.218 Nicotine dependence, cigarettes, with other nicotine-induced disorders S30.813A Abrasion of scrotum and testes, initial encounter Facility Procedures Physician Procedures : CPT4 Code Description Modifier [id number] - WC PHYS LEVEL 3 - EST PT ICD-10 Diagnosis Description L89.324 Pressure ulcer of left buttock, stage 4 L89.314 Pressure ulcer of right buttock, stage 4 L89.154 Pressure ulcer of sacral region, stage 4  L89.892 Pressure ulcer of other site, stage 2 Quantity: 1 Electronic Signature(s) Signed: 03/23/2021 10:53:48 AM By: [name] Keeler PA-C Entered By: [name] Keeler on 03/23/2021 10:53:47

## 2021-03-24 ENCOUNTER — Telehealth: Payer: Self-pay

## 2021-03-24 NOTE — Telephone Encounter (Signed)
Patient was a n/s for his 03/09/21 appt with Dr. Collene Gobble.  RTC, patient c/o burning with urination, foul odor, and dark urine.  States the symptoms started yesterday.  He has a foley catheter in place.  Informed patient he will need to be seen for evaluation and urine sample.  Appt offered today at 1515 with Dr. Marva Panda, he declines appointment and states he lives across town and can't get here by then.   He states he is a patient at [facility], RN informed him to call [facility] to see if he could get a later appt.  He was instructed if he could not get a later appt w/ [facility], he should report to urgent care.  He verbalized understanding.  Will forward to Dr. Marva Panda to see if she agrees or wants to do telehealth. SChaplin, RN,BSN

## 2021-03-24 NOTE — Telephone Encounter (Signed)
Agree that patient will likely need to be seen in person for urine sample. If he is able to get an appointment with [facility] in the next 1-2 days, would recommend following up with them. Otherwise, would recommend going to urgent care or ED for evaluation.

## 2021-03-24 NOTE — Telephone Encounter (Signed)
Pt is requesting a call back he stated that [name] told him to give him a call if he  UTI  again .. pt stated that he has been having  burning and a smell when he urinates ..  pt stated it all started 03/23/21

## 2021-03-28 ENCOUNTER — Emergency Department (HOSPITAL_COMMUNITY)
Admission: EM | Admit: 2021-03-28 | Discharge: 2021-03-28 | Disposition: A | Discharge status: Home / Self Care | Payer: Medicare Other | Attending: Emergency Medicine | Admitting: Emergency Medicine

## 2021-03-28 ENCOUNTER — Emergency Department (HOSPITAL_COMMUNITY): Payer: Medicare Other

## 2021-03-28 DIAGNOSIS — Z79899 Other long term (current) drug therapy: Secondary | ICD-10-CM | POA: Insufficient documentation

## 2021-03-28 DIAGNOSIS — K5900 Constipation, unspecified: Secondary | ICD-10-CM | POA: Insufficient documentation

## 2021-03-28 DIAGNOSIS — R140 Abdominal distension (gaseous): Secondary | ICD-10-CM | POA: Insufficient documentation

## 2021-03-28 DIAGNOSIS — F17210 Nicotine dependence, cigarettes, uncomplicated: Secondary | ICD-10-CM | POA: Insufficient documentation

## 2021-03-28 DIAGNOSIS — I10 Essential (primary) hypertension: Secondary | ICD-10-CM | POA: Diagnosis not present

## 2021-03-28 DIAGNOSIS — R222 Localized swelling, mass and lump, trunk: Secondary | ICD-10-CM | POA: Insufficient documentation

## 2021-03-28 DIAGNOSIS — R109 Unspecified abdominal pain: Secondary | ICD-10-CM | POA: Insufficient documentation

## 2021-03-28 DIAGNOSIS — K219 Gastro-esophageal reflux disease without esophagitis: Secondary | ICD-10-CM | POA: Diagnosis not present

## 2021-03-28 DIAGNOSIS — K5903 Drug induced constipation: Secondary | ICD-10-CM

## 2021-03-28 LAB — COMPREHENSIVE METABOLIC PANEL
ALT: 16 U/L (ref 0–44)
AST: 15 U/L (ref 15–41)
Albumin: 3.3 g/dL — ABNORMAL LOW (ref 3.5–5.0)
Alkaline Phosphatase: 65 U/L (ref 38–126)
Anion gap: 11 (ref 5–15)
BUN: 25 mg/dL — ABNORMAL HIGH (ref 6–20)
CO2: 23 mmol/L (ref 22–32)
Calcium: 9.3 mg/dL (ref 8.9–10.3)
Chloride: 103 mmol/L (ref 98–111)
Creatinine, Ser: 1.58 mg/dL — ABNORMAL HIGH (ref 0.61–1.24)
GFR, Estimated: 51 mL/min — ABNORMAL LOW (ref >60)
Glucose, Bld: 97 mg/dL (ref 70–99)
Potassium: 5 mmol/L (ref 3.5–5.1)
Sodium: 137 mmol/L (ref 135–145)
Total Bilirubin: 0.3 mg/dL (ref 0.3–1.2)
Total Protein: 8.3 g/dL — ABNORMAL HIGH (ref 6.5–8.1)

## 2021-03-28 LAB — CBC WITH DIFFERENTIAL/PLATELET
Abs Immature Granulocytes: 0.03 10*3/uL (ref 0.00–0.07)
Basophils Absolute: 0.1 10*3/uL (ref 0.0–0.1)
Basophils Relative: 1 %
Eosinophils Absolute: 0.3 10*3/uL (ref 0.0–0.5)
Eosinophils Relative: 4 %
HCT: 31.9 % — ABNORMAL LOW (ref 39.0–52.0)
Hemoglobin: 9.2 g/dL — ABNORMAL LOW (ref 13.0–17.0)
Immature Granulocytes: 0 %
Lymphocytes Relative: 26 %
Lymphs Abs: 2 10*3/uL (ref 0.7–4.0)
MCH: 26.3 pg (ref 26.0–34.0)
MCHC: 28.8 g/dL — ABNORMAL LOW (ref 30.0–36.0)
MCV: 91.1 fL (ref 80.0–100.0)
Monocytes Absolute: 0.6 10*3/uL (ref 0.1–1.0)
Monocytes Relative: 8 %
Neutro Abs: 4.6 10*3/uL (ref 1.7–7.7)
Neutrophils Relative %: 61 %
Platelets: 297 10*3/uL (ref 150–400)
RBC: 3.5 MIL/uL — ABNORMAL LOW (ref 4.22–5.81)
RDW: 15.2 % (ref 11.5–15.5)
WBC: 7.5 10*3/uL (ref 4.0–10.5)
nRBC: 0 % (ref 0.0–0.2)

## 2021-03-28 LAB — LIPASE, BLOOD: Lipase: 44 U/L (ref 11–51)

## 2021-03-28 MED ORDER — LACTULOSE 10 GM/15ML PO SOLN: 20.0000 g | Freq: Every day | ORAL | 0 refills | Status: DC | PRN

## 2021-03-28 MED ORDER — LACTULOSE 10 GM/15ML PO SOLN
30.0000 g | Freq: Once | ORAL | Status: AC
  Administered 2021-03-28: 30 g via ORAL
  Filled 2021-03-28: qty 45

## 2021-03-28 MED ORDER — SODIUM CHLORIDE 0.9 % IV BOLUS
1000.0000 mL | Freq: Once | INTRAVENOUS | Status: AC
Start: 2021-03-28 — End: 2021-03-28
  Administered 2021-03-28: 1000 mL via INTRAVENOUS

## 2021-03-28 NOTE — Discharge Instructions (Addendum)
A prescription was sent to your pharmacy to use for lactulose if become constipated.  You can use it as needed if the other medications you have a home do not work.  The CAT scan today was normal without signs of blockage.  It did show constipation.  Labs were normal today.

## 2021-03-28 NOTE — ED Notes (Signed)
Patient given water for PO challenge. Patient noted stool in ostomy bag. RN notified.

## 2021-03-28 NOTE — ED Provider Notes (Signed)
Assumed care from Dr. Stark Jock at 7 AM.  Patient here with diffuse abdominal tenderness, bloating and cramping.  He has had minimal output from his ostomy and reports this has been worsening over the last few months since he started methadone.  Patient did take 4 scoops of MiraLAX and 2 stool softeners yesterday but still has not had much of a bowel movement.  Because of the significant bloating it makes him feel short of breath however he wears 4 L of oxygen at home and is satting 100% on his baseline settings.  CT of the abdomen pelvis showed no evidence of bowel obstruction and an unremarkable left distending colostomy and rectal stump.  He does have moderate stool burden with retained contrast from a CT on 03/02/2021 as well as bilateral chronic sacral decubitus ulcers without evidence of osteomyelitis.  Patient is not displaying symptoms concerning for sepsis.  Vital signs are reassuring.  Discussed the findings with the patient and he does not feel like he can go home feeling like this.  Discussed with the patient that he is severely constipated most likely related to the methadone.  We will give him a dose of lactulose here and can give him a prescription.  12:19 PM Pt had a BM here prior to d/c.   Blanchie Dessert, MD 03/28/21 1219
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
## 2021-03-28 NOTE — ED Notes (Signed)
PTAR on their way as of 80

## 2021-03-30 ENCOUNTER — Telehealth: Payer: Self-pay | Admitting: *Deleted

## 2021-03-30 ENCOUNTER — Ambulatory Visit (HOSPITAL_COMMUNITY)
Admission: RE | Admit: 2021-03-30 | Discharge: 2021-03-30 | Disposition: A | Discharge status: Home / Self Care | Payer: Medicare Other | Source: Ambulatory Visit | Attending: Internal Medicine | Admitting: Internal Medicine

## 2021-03-30 ENCOUNTER — Other Ambulatory Visit: Payer: Self-pay

## 2021-03-30 ENCOUNTER — Ambulatory Visit (INDEPENDENT_AMBULATORY_CARE_PROVIDER_SITE_OTHER): Payer: Medicare Other | Admitting: Student

## 2021-03-30 VITALS — BP 147/55 | HR 72 | Temp 98.1°F

## 2021-03-30 DIAGNOSIS — I10 Essential (primary) hypertension: Secondary | ICD-10-CM

## 2021-03-30 DIAGNOSIS — R0600 Dyspnea, unspecified: Secondary | ICD-10-CM

## 2021-03-30 DIAGNOSIS — Z978 Presence of other specified devices: Secondary | ICD-10-CM | POA: Diagnosis not present

## 2021-03-30 DIAGNOSIS — R0601 Orthopnea: Secondary | ICD-10-CM | POA: Diagnosis not present

## 2021-03-30 MED ORDER — FUROSEMIDE 40 MG PO TABS: 40.0000 mg | ORAL_TABLET | Freq: Every day | ORAL | 0 refills | Status: DC

## 2021-03-30 NOTE — Telephone Encounter (Signed)
Patient called in stating there are times when it is difficult to "catch my breath." States he has to have the [facility] on all the time because the heat makes it difficult to breathe. Appt given today at 1515 with PCP.
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
## 2021-03-30 NOTE — Patient Instructions (Signed)
Mr.Daymond Salata, it was a pleasure seeing you today!  Today we discussed: - Shortness of breath: I have ordered a chest x-ray and ultrasound of your heart. In addition, I have ordered a fluid pill for you to take over the next five days. I would like to see you back in one week to re-evaluate and check lab work.  Tests ordered today:  - Chest x-ray - Echocardiogram  I have ordered the following medication/changed the following medications:   Start the following medications: Meds ordered this encounter  Medications   furosemide (LASIX) 40 MG tablet    Sig: Take 1 tablet (40 mg total) by mouth daily for 5 days.    Dispense:  5 tablet    Refill:  0     Follow-up:  1 week    Please make sure to arrive 15 minutes prior to your next appointment. If you arrive late, you may be asked to reschedule.   We look forward to seeing you next time. Please call our clinic at [phone] if you have any questions or concerns. The best time to call is Monday-Friday from 9am-4pm, but there is someone available 24/7. If after hours or the weekend, call the main hospital number and ask for the Internal Medicine Resident On-Call. If you need medication refills, please notify your pharmacy one week in advance and they will send [place] a request.  Thank you for letting us take part in your care. Wishing you the best!  Thank you, [name], MD

## 2021-04-01 DIAGNOSIS — Z978 Presence of other specified devices: Secondary | ICD-10-CM | POA: Insufficient documentation

## 2021-04-01 DIAGNOSIS — R0601 Orthopnea: Secondary | ICD-10-CM | POA: Insufficient documentation

## 2021-04-01 NOTE — Assessment & Plan Note (Signed)
Mr. Daoust is presenting to clinic today to discuss recent intermittent dyspnea. He mentions this has been occurring for the last month or so. He endorses dyspnea when exerting himself. Notes that he also experiences this when he lays down flat. He usually sleeps in his wheelchair but has been sleeping more upright recently. Denies paroxysmal nocturnal dyspnea, chest pain. No previous cardiac history or echocardiogram in the chart.   Mr. Chavarin does have several risk factors for cardiovascular disease including obesity, hypertension, previous cocaine use, and obstructive sleep apnea. On exam, no JVD but 1-2+ pitting edema present. Will plan on obtaining chest x-ray and echocardiogram for further evaluation. Mr. Hawker had lab work done two days ago, which revealed renal function back to baseline. Will have Mr. Riling take short course of diuretics and return in one week for re-evaluation.   - Chest x-ray, echocardiogram ordered - Furosemide 40mg  x5 days - Return to clinic in one week

## 2021-04-06 ENCOUNTER — Telehealth: Payer: Self-pay

## 2021-04-06 NOTE — Telephone Encounter (Signed)
Please call pt back about feeling bloated.

## 2021-04-06 NOTE — Telephone Encounter (Signed)
Returned call to patient. States he was seen in ED for constipation. Had some relief but then "it comes back." Drinks plenty of fluids but does not eat enough fiber. States when he feels bloated it affects his breathing. Denies any difficulty breathing at present. No available appt this week. Patient states he will go to ED or UC for this. Recommended he f/u in clinic next week for help establishing bowel regimen. Patient in agreement. He has appt [facility] on Tuesday at 1015. He is requesting appt before or directly after that appt. Only available appt is with Intern. Appt given with Dr. Jeanice Lim at [phone]. Will forward to [employer]	[place] to change this appt if not appropriate.

## 2021-04-07 ENCOUNTER — Emergency Department (HOSPITAL_COMMUNITY): Payer: Medicare Other

## 2021-04-07 ENCOUNTER — Other Ambulatory Visit: Payer: Self-pay

## 2021-04-07 ENCOUNTER — Emergency Department (HOSPITAL_COMMUNITY)
Admission: EM | Admit: 2021-04-07 | Discharge: 2021-04-07 | Disposition: A | Discharge status: Home / Self Care | Payer: Medicare Other | Attending: Emergency Medicine | Admitting: Emergency Medicine

## 2021-04-07 ENCOUNTER — Encounter (HOSPITAL_COMMUNITY): Payer: Self-pay

## 2021-04-07 DIAGNOSIS — Z79899 Other long term (current) drug therapy: Secondary | ICD-10-CM | POA: Diagnosis not present

## 2021-04-07 DIAGNOSIS — K5900 Constipation, unspecified: Secondary | ICD-10-CM | POA: Diagnosis not present

## 2021-04-07 DIAGNOSIS — I10 Essential (primary) hypertension: Secondary | ICD-10-CM | POA: Diagnosis not present

## 2021-04-07 DIAGNOSIS — K219 Gastro-esophageal reflux disease without esophagitis: Secondary | ICD-10-CM | POA: Diagnosis not present

## 2021-04-07 DIAGNOSIS — R140 Abdominal distension (gaseous): Secondary | ICD-10-CM | POA: Diagnosis present

## 2021-04-07 DIAGNOSIS — F17210 Nicotine dependence, cigarettes, uncomplicated: Secondary | ICD-10-CM | POA: Diagnosis not present

## 2021-04-07 DIAGNOSIS — R197 Diarrhea, unspecified: Secondary | ICD-10-CM | POA: Diagnosis not present

## 2021-04-07 LAB — CBC WITH DIFFERENTIAL/PLATELET
Abs Immature Granulocytes: 0.02 10*3/uL (ref 0.00–0.07)
Basophils Absolute: 0.1 10*3/uL (ref 0.0–0.1)
Basophils Relative: 1 %
Eosinophils Absolute: 0.4 10*3/uL (ref 0.0–0.5)
Eosinophils Relative: 5 %
HCT: 31.9 % — ABNORMAL LOW (ref 39.0–52.0)
Hemoglobin: 9.4 g/dL — ABNORMAL LOW (ref 13.0–17.0)
Immature Granulocytes: 0 %
Lymphocytes Relative: 32 %
Lymphs Abs: 2.2 10*3/uL (ref 0.7–4.0)
MCH: 26.3 pg (ref 26.0–34.0)
MCHC: 29.5 g/dL — ABNORMAL LOW (ref 30.0–36.0)
MCV: 89.4 fL (ref 80.0–100.0)
Monocytes Absolute: 0.7 10*3/uL (ref 0.1–1.0)
Monocytes Relative: 10 %
Neutro Abs: 3.6 10*3/uL (ref 1.7–7.7)
Neutrophils Relative %: 52 %
Platelets: 266 10*3/uL (ref 150–400)
RBC: 3.57 MIL/uL — ABNORMAL LOW (ref 4.22–5.81)
RDW: 15.4 % (ref 11.5–15.5)
WBC: 6.9 10*3/uL (ref 4.0–10.5)
nRBC: 0 % (ref 0.0–0.2)

## 2021-04-07 LAB — URINALYSIS, ROUTINE W REFLEX MICROSCOPIC
Bilirubin Urine: NEGATIVE
Glucose, UA: NEGATIVE mg/dL
Ketones, ur: NEGATIVE mg/dL
Leukocytes,Ua: NEGATIVE
Nitrite: NEGATIVE
Protein, ur: NEGATIVE mg/dL
Specific Gravity, Urine: 1.003 — ABNORMAL LOW (ref 1.005–1.030)
pH: 6 (ref 5.0–8.0)

## 2021-04-07 LAB — COMPREHENSIVE METABOLIC PANEL
ALT: 14 U/L (ref 0–44)
AST: 19 U/L (ref 15–41)
Albumin: 3.6 g/dL (ref 3.5–5.0)
Alkaline Phosphatase: 62 U/L (ref 38–126)
Anion gap: 9 (ref 5–15)
BUN: 27 mg/dL — ABNORMAL HIGH (ref 6–20)
CO2: 25 mmol/L (ref 22–32)
Calcium: 9.4 mg/dL (ref 8.9–10.3)
Chloride: 100 mmol/L (ref 98–111)
Creatinine, Ser: 1.44 mg/dL — ABNORMAL HIGH (ref 0.61–1.24)
GFR, Estimated: 57 mL/min — ABNORMAL LOW (ref >60)
Glucose, Bld: 101 mg/dL — ABNORMAL HIGH (ref 70–99)
Potassium: 4.6 mmol/L (ref 3.5–5.1)
Sodium: 134 mmol/L — ABNORMAL LOW (ref 135–145)
Total Bilirubin: 0.5 mg/dL (ref 0.3–1.2)
Total Protein: 8.3 g/dL — ABNORMAL HIGH (ref 6.5–8.1)

## 2021-04-07 LAB — LIPASE, BLOOD: Lipase: 44 U/L (ref 11–51)

## 2021-04-07 MED ORDER — SODIUM CHLORIDE 0.9 % IV BOLUS: 1000.0000 mL | Freq: Once | INTRAVENOUS | Status: DC

## 2021-04-07 MED ORDER — DICYCLOMINE HCL 20 MG PO TABS: 20.0000 mg | ORAL_TABLET | Freq: Two times a day (BID) | ORAL | 0 refills | Status: DC | PRN

## 2021-04-07 MED ORDER — DICYCLOMINE HCL 10 MG PO CAPS
10.0000 mg | ORAL_CAPSULE | Freq: Once | ORAL | Status: AC
  Administered 2021-04-07: 10 mg via ORAL
  Filled 2021-04-07: qty 1

## 2021-04-07 NOTE — ED Notes (Signed)
Patient transported to CT 

## 2021-04-07 NOTE — ED Notes (Signed)
[facility] contacted, transport arranged for patient.
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
## 2021-04-07 NOTE — ED Triage Notes (Signed)
Pt bib EMS from home with complaint of abdominal bloating each time he eats. Pt reports difficulty breathing with bloating, and states he has not eaten very much over the last few days but has been drinking plenty of water. Pt paralyzed from the waist down.   EMS vitals: BP 140/66 HR 82 SPO2 97% RR 20

## 2021-04-07 NOTE — Discharge Instructions (Addendum)
Your CT scan did not show any bowel obstruction.  He had multiple scans this month already and at this point I do recommend that you just follow-up with the specialist.  I have referred you to a GI doctor and surgeon for follow-up.  Please call the number for follow-up or have your primary care doctor do a referral  Take Bentyl for cramps  Continue current meds.  Return to ER if you have worse abdominal pain, vomiting, trouble breathing, fever.

## 2021-04-07 NOTE — ED Notes (Signed)
Patient returned from CT

## 2021-04-08 LAB — URINE CULTURE: Culture: <10000 — AB

## 2021-04-11 ENCOUNTER — Ambulatory Visit (INDEPENDENT_AMBULATORY_CARE_PROVIDER_SITE_OTHER): Payer: Medicare Other | Admitting: Student

## 2021-04-11 ENCOUNTER — Telehealth: Payer: Self-pay

## 2021-04-11 ENCOUNTER — Other Ambulatory Visit: Payer: Self-pay

## 2021-04-11 VITALS — BP 123/47 | HR 85 | Temp 98.0°F | Ht 73.0 in | Wt 320.0 lb

## 2021-04-11 DIAGNOSIS — R140 Abdominal distension (gaseous): Secondary | ICD-10-CM | POA: Insufficient documentation

## 2021-04-11 NOTE — Assessment & Plan Note (Addendum)
[name] is presenting to clinic today with over two weeks of abdominal distension. He was originally seen in the Emergency Department on 6/27 for two days of decreased stool output and abdominal distension. At that time imaging revealed moderate stool burden without evidence of obstruction. It was thought this could be opioid-induced constipation. He was given lactulose and mag citrate, which allowed him to have stool output for the next 4-5 days. During this time he abruptly stopped his methadone. He denies any withdrawal symptoms. Unfortunately, he has not had any stool output since this time. He returned to the Emergency Department on 7/7 for the same issue. Imaging revealed nonobstructive gas pattern with minimal stool burden. He was prescribed Bentyl and discharged from the ED.  Since this time, [name] reports his symptoms have persisted without relief. States that he does have an appetite, but is afraid to eat because it will exacerbate his bloating. He mentions he was only able to eat half a cup of broth yesterday before symptoms worsened. Also states he has not been able to drink except a few sips of water daily. [name] notes that when the bloating is bad, he becomes dyspneic. He denies nausea, vomiting, fevers, chills, palpitations. Currently he has the same ostomy bag that was placed eight days ago as there has not been any gas or stool output.   On exam, his abdomen is soft, tympanic, non-tender, and distended with hyperactive bowel sounds. Imaging from his recent Emergency Department visits were reviewed. At this time unsure if a laxative will help his symptoms given there is minimal stool. In addition, I have instructed him to stop Bentyl due to anti-cholinergic side effects. He denies taking any over the counter medications that could be exacerbating this. Will hold other prokinetic medications such as metoclopramide and erythromycin as likely etiology for his symptoms is in the  lower GI tract. Given he is having minimal po intake and symptoms have been persisting for two weeks, I believe he would most benefit from an urgent gastroenterology referral. In the meantime, I will reach out to our online consult service for any suggestions for symptomatic relief until he can be seen by GI in person.  - Urgent gastroenterology referral - Follow-up with online consult service - Stop Bentyl - ED precautions given   ADDENDUM: Per online consult service, multiple different treatment options, including a laxative purge vs trial of peppermint oil with enteric coating. We will start with trial of peppermint oil and have patient call the clinic back if symptoms are not relieved. In addition, will place referral for ostomy clinic for evaluation. - IBGARD (peppermint oil) two tablets before each meal - Ostomy clinic referral
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
## 2021-04-11 NOTE — Telephone Encounter (Signed)
TC transferred to triage from front desk. Patient states he has been to the ED twice recently for abdominal distension and constipation.  He states "the ED hasn't done anything for me and I need help, something isn't right".  Patient c/o abdominal bloating and gas. Reports last BM 7 days ago.  Reports imaging in ED.   States he can't eat because he bloats up so much which is causing him SOB.  Pt paraplegic in w/c.  He has an appt tomorrow w/ blue team, states he can come today. Will forward to PCP to advise. SChaplin, RN,BSN

## 2021-04-11 NOTE — Patient Instructions (Signed)
Mr.Jeran Killings, it was a pleasure seeing you today!  Today we discussed: - Abdominal bloating: It appears that you have a lot of air in your abdomen. I am going to reach out to our consult service today to see if they have any recommendations. In the mean time, I have placed an urgent referral to our GI doctors to get you in as soon as possible.   - Stop Bentyl  Referrals ordered today:   Referral Orders  Ambulatory referral to Gastroenterology     I have ordered the following medication/changed the following medications:   Stop the following medications: Medications Discontinued During This Encounter  Medication Reason   dicyclomine (BENTYL) 20 MG tablet     Follow-up:  if symptoms do not improve    Please make sure to arrive 15 minutes prior to your next appointment. If you arrive late, you may be asked to reschedule.   We look forward to seeing you next time. Please call our clinic at [phone] if you have any questions or concerns. The best time to call is Monday-Friday from 9am-4pm, but there is someone available 24/7. If after hours or the weekend, call the main hospital number and ask for the Internal Medicine Resident On-Call. If you need medication refills, please notify your pharmacy one week in advance and they will send [place] a request.  Thank you for letting us take part in your care. Wishing you the best!  Thank you, [name], MD

## 2021-04-11 NOTE — Telephone Encounter (Signed)
RN spoke to PCP and patient added to his schedule at 1445.  Patient called and notified, states he will be here for the appt today. SChaplin, RN,BSN

## 2021-04-12 ENCOUNTER — Ambulatory Visit (HOSPITAL_COMMUNITY): Admission: RE | Admit: 2021-04-12 | Payer: Medicare Other | Source: Ambulatory Visit

## 2021-04-12 ENCOUNTER — Encounter: Payer: Medicare Other | Admitting: Internal Medicine

## 2021-04-12 ENCOUNTER — Other Ambulatory Visit: Payer: Self-pay | Admitting: Student

## 2021-04-12 DIAGNOSIS — R140 Abdominal distension (gaseous): Secondary | ICD-10-CM

## 2021-04-12 MED ORDER — IBGARD 90 MG PO CPCR: 2.0000 | ORAL_CAPSULE | Freq: Three times a day (TID) | ORAL | 0 refills | Status: DC

## 2021-04-12 MED ORDER — IBGARD 90 MG PO CPCR: 2.0000 | ORAL_CAPSULE | Freq: Three times a day (TID) | ORAL | 0 refills | Status: AC

## 2021-04-12 NOTE — Addendum Note (Signed)
Addended bySanjuan Dame on: 04/12/2021 04:46 PM   Modules accepted: Orders

## 2021-04-19 ENCOUNTER — Other Ambulatory Visit: Payer: Self-pay | Admitting: Student

## 2021-04-19 DIAGNOSIS — R140 Abdominal distension (gaseous): Secondary | ICD-10-CM

## 2021-04-19 MED ORDER — SENNA-DOCUSATE SODIUM 8.6-50 MG PO TABS: 2.0000 | ORAL_TABLET | Freq: Every day | ORAL | 0 refills | Status: AC

## 2021-04-19 NOTE — Progress Notes (Signed)
Internal Medicine Clinic Attending ? ?Case discussed with Dr. Braswell  At the time of the visit.  We reviewed the resident?s history and exam and pertinent patient test results.  I agree with the assessment, diagnosis, and plan of care documented in the resident?s note.  ?

## 2021-04-20 ENCOUNTER — Encounter (HOSPITAL_BASED_OUTPATIENT_CLINIC_OR_DEPARTMENT_OTHER): Payer: Medicare Other | Attending: Physician Assistant | Admitting: Physician Assistant

## 2021-04-22 ENCOUNTER — Emergency Department (HOSPITAL_COMMUNITY)
Admission: EM | Admit: 2021-04-22 | Discharge: 2021-04-22 | Disposition: A | Discharge status: Home / Self Care | Payer: Medicare Other | Attending: Emergency Medicine | Admitting: Emergency Medicine

## 2021-04-22 ENCOUNTER — Encounter (HOSPITAL_COMMUNITY): Payer: Self-pay | Admitting: Emergency Medicine

## 2021-04-22 ENCOUNTER — Ambulatory Visit (HOSPITAL_BASED_OUTPATIENT_CLINIC_OR_DEPARTMENT_OTHER)
Admission: RE | Admit: 2021-04-22 | Discharge: 2021-04-22 | Disposition: A | Discharge status: Home / Self Care | Payer: Medicare Other | Source: Ambulatory Visit

## 2021-04-22 ENCOUNTER — Other Ambulatory Visit: Payer: Self-pay

## 2021-04-22 ENCOUNTER — Emergency Department (HOSPITAL_COMMUNITY): Payer: Medicare Other

## 2021-04-22 DIAGNOSIS — K5904 Chronic idiopathic constipation: Secondary | ICD-10-CM | POA: Diagnosis not present

## 2021-04-22 DIAGNOSIS — Z933 Colostomy status: Secondary | ICD-10-CM | POA: Insufficient documentation

## 2021-04-22 DIAGNOSIS — R109 Unspecified abdominal pain: Secondary | ICD-10-CM | POA: Insufficient documentation

## 2021-04-22 DIAGNOSIS — K219 Gastro-esophageal reflux disease without esophagitis: Secondary | ICD-10-CM | POA: Insufficient documentation

## 2021-04-22 DIAGNOSIS — I10 Essential (primary) hypertension: Secondary | ICD-10-CM | POA: Insufficient documentation

## 2021-04-22 DIAGNOSIS — F17210 Nicotine dependence, cigarettes, uncomplicated: Secondary | ICD-10-CM | POA: Diagnosis not present

## 2021-04-22 DIAGNOSIS — R140 Abdominal distension (gaseous): Secondary | ICD-10-CM | POA: Insufficient documentation

## 2021-04-22 DIAGNOSIS — R1012 Left upper quadrant pain: Secondary | ICD-10-CM | POA: Insufficient documentation

## 2021-04-22 DIAGNOSIS — Z79899 Other long term (current) drug therapy: Secondary | ICD-10-CM | POA: Diagnosis not present

## 2021-04-22 DIAGNOSIS — K5900 Constipation, unspecified: Secondary | ICD-10-CM | POA: Diagnosis not present

## 2021-04-22 DIAGNOSIS — R1084 Generalized abdominal pain: Secondary | ICD-10-CM

## 2021-04-22 DIAGNOSIS — K5909 Other constipation: Secondary | ICD-10-CM

## 2021-04-22 LAB — COMPREHENSIVE METABOLIC PANEL
ALT: 18 U/L (ref 0–44)
AST: 18 U/L (ref 15–41)
Albumin: 3.3 g/dL — ABNORMAL LOW (ref 3.5–5.0)
Alkaline Phosphatase: 56 U/L (ref 38–126)
Anion gap: 11 (ref 5–15)
BUN: 21 mg/dL — ABNORMAL HIGH (ref 6–20)
CO2: 20 mmol/L — ABNORMAL LOW (ref 22–32)
Calcium: 8.9 mg/dL (ref 8.9–10.3)
Chloride: 108 mmol/L (ref 98–111)
Creatinine, Ser: 1.17 mg/dL (ref 0.61–1.24)
GFR, Estimated: >60 mL/min (ref >60)
Glucose, Bld: 110 mg/dL — ABNORMAL HIGH (ref 70–99)
Potassium: 3.7 mmol/L (ref 3.5–5.1)
Sodium: 139 mmol/L (ref 135–145)
Total Bilirubin: 0.3 mg/dL (ref 0.3–1.2)
Total Protein: 7.7 g/dL (ref 6.5–8.1)

## 2021-04-22 LAB — CBC WITH DIFFERENTIAL/PLATELET
Abs Immature Granulocytes: 0.02 10*3/uL (ref 0.00–0.07)
Basophils Absolute: 0 10*3/uL (ref 0.0–0.1)
Basophils Relative: 1 %
Eosinophils Absolute: 0.4 10*3/uL (ref 0.0–0.5)
Eosinophils Relative: 7 %
HCT: 36.2 % — ABNORMAL LOW (ref 39.0–52.0)
Hemoglobin: 10.7 g/dL — ABNORMAL LOW (ref 13.0–17.0)
Immature Granulocytes: 0 %
Lymphocytes Relative: 32 %
Lymphs Abs: 1.8 10*3/uL (ref 0.7–4.0)
MCH: 26.6 pg (ref 26.0–34.0)
MCHC: 29.6 g/dL — ABNORMAL LOW (ref 30.0–36.0)
MCV: 90 fL (ref 80.0–100.0)
Monocytes Absolute: 0.4 10*3/uL (ref 0.1–1.0)
Monocytes Relative: 7 %
Neutro Abs: 3 10*3/uL (ref 1.7–7.7)
Neutrophils Relative %: 53 %
Platelets: 246 10*3/uL (ref 150–400)
RBC: 4.02 MIL/uL — ABNORMAL LOW (ref 4.22–5.81)
RDW: 16.4 % — ABNORMAL HIGH (ref 11.5–15.5)
WBC: 5.7 10*3/uL (ref 4.0–10.5)
nRBC: 0 % (ref 0.0–0.2)

## 2021-04-22 LAB — LIPASE, BLOOD: Lipase: 50 U/L (ref 11–51)

## 2021-04-22 MED ORDER — SORBITOL 70 % SOLN
300.0000 mL | TOPICAL_OIL | Freq: Once | ORAL | Status: DC
  Filled 2021-04-22: qty 90

## 2021-04-22 NOTE — ED Provider Notes (Signed)
Emergency Medicine Provider Triage Evaluation Note  [name] , a 55 y.o. male  was evaluated in triage.  Pt complains of constipation & abdominal pain. Last BM 5 days prior, still passing gas. Tried multiple meds without relief.   Review of Systems  Positive: Abdominal pain, constipation. Negative: Fever, vomiting.   Physical Exam  BP 118/61   Pulse 71   Temp 97.8 F (36.6 C) (Oral)   Resp 18   Ht 6' (1.829 m)   Wt (!) 173.3 kg   SpO2 99%   BMI 51.81 kg/m  Gen:   Awake, no distress   Resp:  Normal effort  MSK:   Moves extremities without difficulty  Other:  Abdominal exam: Ostomy in place, actively having stool output into ostomy at this time. Mild generalized tenderness.   Medical Decision Making  Medically screening exam initiated at 11:05 AM.  Appropriate orders placed.  Jakwan Librarian, academic was informed that the remainder of the evaluation will be completed by another provider, this initial triage assessment does not replace that evaluation, and the importance of remaining in the ED until their evaluation is complete.  Will check labs & X-ray, holding off on CT imaging for now from triage given patient having active BM at this time and no peritoneal signs on abdominal exam.   Constipation.      Leafy Kindle 04/22/21 1108    Pattricia Boss, MD 04/26/21 2259

## 2021-04-22 NOTE — Discharge Instructions (Addendum)
It was our pleasure to provide your ER care today - we hope that you feel better.  Drink plenty of fluids/stay well hydrated. Get adequate fiber in diet. Minimize opiate use. Continue stool softener.   Follow up with primary care doctor and gi doctor as planned.   Return to ER if worse, new symptoms, fevers, persistent vomiting, severe pain, or other concern.
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
## 2021-04-22 NOTE — ED Triage Notes (Signed)
Pt endorses constipation and last BM 5 days ago. Endorses generalized abd pain. No relief with OTC meds.

## 2021-04-22 NOTE — Progress Notes (Signed)
[facility]   Reason for visit:  LUQ colostomy with ongoing abdominal bloating and pain.   HPI:  Colostomy, stage 4 sacral wounds, managed by wound care center.  ROS  Review of Systems  Gastrointestinal:  Positive for abdominal distention, abdominal pain and constipation.  Skin:  Positive for color change.       Loss of melanin to peristomal skin from adhesive stripping.  Due to supply issues, has been taping old pouch with strong house hold tape and re using bags  Vital signs:  BP (!) 108/53 (BP Location: Left Arm)   Pulse 71   Temp 98 F (36.7 C) (Oral)   Resp 18   SpO2 98%  Exam:  Physical Exam Abdominal:     General: The ostomy site is clean. Bowel sounds are decreased. There is distension.     Tenderness: There is generalized abdominal tenderness.    Skin:    Comments: House hold tape to secure pouch has stripped melanin to peristomal skin.     Stoma type/location:  LUQ colostomy Stomal assessment/size:  Oval:  2.5 cm x 5 cm pale pink and moist  No output in 4 days, he reports.  Peristomal assessment:  Abdomen and round and tender to touch.  Treatment options for stomal/peristomal skin: Barrier ring and 2 3/4" pouch to fit irrigation sleeve.  Output: None.  I will perform a SMOG enema via the stoma. I digitize the stoma and feel dry, hard stool just inside the os.  Patient was in ED 04/11/21 and had bowel clean out iwith lactulose and mag citrate.  HE  has had ongoing issues with constipation since then. He is scheduled to see GI team on 05/20/21.  He has been using peppermint oil with minimal effectiveness.  He has stopped taking his methadone to minimize constipation at this time  Ostomy pouching: 2pc. 2 3/4" pouch.  I digitally explored the stoma and felt hard dry stool.  I then inserted the cone in the direction of the bowel.  I Connected to irrigation sleeve and administered SMOG enema.  Patient received 350 ml of SMOG solution over the course of an hour with 1  hard pellet of feculent material and approximately 200 ml of the SMOG solution.  I explored the stoma and could no longer feel any stool.  Patient feels certain he is constipated and I explain it may be farther back in the bowel than the enema can reach. He would like to go back to the ED for a clean out.  I call down and inform the charge nurse that he is on the way.  Patient reports a feeling of fullness and bowel sounds are more active after administration.  Education provided:  Caregiver with patient at bedside.  Patient performs most of his own ostomy care.  [facility] nurse recently stopped coming to his home.  They were providing pouches and he has no supply company set up.  I will set him up with [facility] today.     Impression/dx  Constipation Discussion  Little stool from SMOG enema. Due to discomfort, patient wants to be seen in ED for clean out and diagnostic tests.  Plan  Patient reporting to ED for ongoing evaluation and testing not available in clinic.  Will set up for supplies.  Will see GI specialist for ongoing diagnostic work up    Visit time: 120  minutes.   [name] FNP-BC

## 2021-04-22 NOTE — Discharge Instructions (Addendum)
Today, we administered an edema into your stoma to relieve constipation.  Please keep appointment with GI doctor to further diagnose the cause and best treatment going forward.  See me in clinic  in one week. 7/29 at 9:00 AM

## 2021-04-29 ENCOUNTER — Ambulatory Visit (HOSPITAL_COMMUNITY): Payer: Medicare Other

## 2021-05-09 ENCOUNTER — Encounter (HOSPITAL_BASED_OUTPATIENT_CLINIC_OR_DEPARTMENT_OTHER): Payer: Medicare Other | Attending: Physician Assistant | Admitting: Physician Assistant

## 2021-05-09 ENCOUNTER — Other Ambulatory Visit: Payer: Self-pay

## 2021-05-09 DIAGNOSIS — F17218 Nicotine dependence, cigarettes, with other nicotine-induced disorders: Secondary | ICD-10-CM | POA: Diagnosis not present

## 2021-05-09 DIAGNOSIS — L89314 Pressure ulcer of right buttock, stage 4: Secondary | ICD-10-CM | POA: Insufficient documentation

## 2021-05-09 DIAGNOSIS — E46 Unspecified protein-calorie malnutrition: Secondary | ICD-10-CM | POA: Diagnosis not present

## 2021-05-09 DIAGNOSIS — Z933 Colostomy status: Secondary | ICD-10-CM | POA: Diagnosis not present

## 2021-05-09 DIAGNOSIS — L89893 Pressure ulcer of other site, stage 3: Secondary | ICD-10-CM | POA: Insufficient documentation

## 2021-05-09 DIAGNOSIS — G8222 Paraplegia, incomplete: Secondary | ICD-10-CM | POA: Insufficient documentation

## 2021-05-09 DIAGNOSIS — Z6829 Body mass index (BMI) 29.0-29.9, adult: Secondary | ICD-10-CM | POA: Insufficient documentation

## 2021-05-09 DIAGNOSIS — L89892 Pressure ulcer of other site, stage 2: Secondary | ICD-10-CM | POA: Diagnosis not present

## 2021-05-09 DIAGNOSIS — G894 Chronic pain syndrome: Secondary | ICD-10-CM | POA: Insufficient documentation

## 2021-05-09 DIAGNOSIS — L89324 Pressure ulcer of left buttock, stage 4: Secondary | ICD-10-CM | POA: Insufficient documentation

## 2021-05-09 DIAGNOSIS — L89154 Pressure ulcer of sacral region, stage 4: Secondary | ICD-10-CM | POA: Diagnosis not present

## 2021-05-09 NOTE — Progress Notes (Addendum)
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 really talking in the dressings just laying them on top. Again I think this is going to be a very slow process if he has a chance of healing this at all to be honest. With that being said there is no evidence of active severe infection at this point which is good news. 05/26/2020 on evaluation today patient appears to be doing  okay with regard to his wounds. There is no signs of active infection at this time which is great news. Overall there is really no significant change for better or worse. 08/04/2020 upon evaluation today patient actually appears to be doing quite well with regard to his wounds. I see nothing that appears to be worse at this time. Has been tolerating the dressing changes without complication. Fortunately there is no signs of  active infection at this time. No fevers, chills, nausea, vomiting, or diarrhea. 09/15/2020 on evaluation today patient appears to be doing well with regard to his wounds all things considered they are doing about the same. There is no signs of active infection at this time. No fevers, chills, nausea, vomiting, or diarrhea. 11/03/2020 upon evaluation today patient appears to be doing unfortunately not too great. He has gained quite a bit of weight and to be honest this coupled with having issues with what he calls "tendinitis" in his elbows, knees, wrist, has caused issues with him being able to transfer he can no longer transfer himself. I do believe that this likely is also at least in part or if not in full related to significant weight gain even since I saw him last on December 15. I am not sure how much she has gained but he has gained quite a bit. He does have a bed that we did get replaced for him this is an air mattress which is alternating. With that being said unfortunately even with this I think he is mainly staying in bed all the time now he does not get up in his wheelchair much at all. Nonetheless I think that this is leading to even more issues from that standpoint as he is not moving as much as he was in the past. All of this is leading to him having some pretty significant issues at this point all of which I think are completely real but all of which I think may be at least propagated in part by his weight gain. He has not talked to his primary care provider yet about  any of this. 12/29/2020 on evaluation today patient appears to be doing well with regard to his wounds all things considered. In fact on the right side gluteal region I really cannot find anything open at this point which is great news. Fortunately I think that he is not showing any signs of significant infection which is also great news. No fevers, chills, nausea, vomiting, or diarrhea. 5/25; patient presents for 9-monthfollow-up. He reports he is using silver alginate to the wounds. He has no complaints or issues today except for that he has a new wound to his scrotum. He denies signs of infection today. 03/23/2021 upon evaluation today patient appears to be doing well with regard to his wounds in general. Fortunately there is no signs of active infection at this time which is great news. No fevers, chills, nausea, vomiting, or diarrhea. 05/09/2021 upon evaluation today patient appears to be doing about the same really in regard to his wound. Really nothing much is changed in general things are in some ways a [name] smaller but again overall I do not think there is a significant improvement compared to where we were previous. We cannot really pack the wounds and in fact if we do he gets very upset stating that we cause discomfort and pain. Objective Constitutional Obese and well-hydrated in no acute distress. Vitals Time Taken: 10:18 AM, Height: 72 in, Source: Stated, Weight: 215 lbs, Source: Stated, BMI: 29.2, Temperature: 98.1 F, Pulse: 98 bpm, Respiratory Rate: 20 breaths/min, Blood Pressure: 126/76 mmHg. Respiratory normal breathing without difficulty. Psychiatric this patient is able to make decisions and demonstrates good insight into disease process. Alert and Oriented x 3. pleasant and cooperative. General Notes: Upon inspection patient's wound bed actually showed signs of good granulation epithelization at this point. There does not appear to be any signs of active  infection which is  great news and overall I am extremely pleased with where things stand today. All things considered I think this is about as best as we can hope for. Integumentary (Hair, Skin) Wound #19 status is Open. Original cause of wound was Pressure Injury. The date acquired was: 10/02/2013. The wound has been in treatment 196 weeks. The wound is located on the Sacrum. The wound measures 1cm length x 1cm width x 0.8cm depth; 0.785cm^2 area and 0.628cm^3 volume. There is Fat Layer (Subcutaneous Tissue) exposed. There is no tunneling noted, however, there is undermining starting at 1:00 and ending at 11:00 with a maximum distance of 0.9cm. There is a medium amount of serosanguineous drainage noted. The wound margin is fibrotic, thickened scar. There is large (67-100%) pink, pale granulation within the wound bed. There is no necrotic tissue within the wound bed. Wound #21 status is Open. Original cause of wound was Gradually Appeared. The date acquired was: 10/02/2012. The wound has been in treatment 161 weeks. The wound is located on the Right,Medial Ischium. The wound measures 0.5cm length x 1.1cm width x 1.5cm depth; 0.432cm^2 area and 0.648cm^3 volume. There is Fat Layer (Subcutaneous Tissue) exposed. There is no tunneling or undermining noted. There is a small amount of serosanguineous drainage noted. The wound margin is well defined and not attached to the wound base. There is large (67-100%) pink granulation within the wound bed. There is no necrotic tissue within the wound bed. Wound #22 status is Open. Original cause of wound was Gradually Appeared. The date acquired was: 10/02/2013. The wound has been in treatment 161 weeks. The wound is located on the Left Ischium. The wound measures 2.2cm length x 0.8cm width x 1.9cm depth; 1.382cm^2 area and 2.626cm^3 volume. There is Fat Layer (Subcutaneous Tissue) exposed. There is tunneling at 11:00 with a maximum distance of 4.2cm. There is a medium amount of  serosanguineous drainage noted. The wound margin is epibole. There is large (67-100%) pink, pale granulation within the wound bed. There is no necrotic tissue within the wound bed. Wound #27 status is Open. Original cause of wound was Pressure Injury. The date acquired was: 02/23/2021. The wound has been in treatment 10 weeks. The wound is located on the Scrotum. The wound measures 2.5cm length x 2.4cm width x 0.1cm depth; 4.712cm^2 area and 0.471cm^3 volume. There is Fat Layer (Subcutaneous Tissue) exposed. There is no tunneling or undermining noted. There is a medium amount of serosanguineous drainage noted. The wound margin is distinct with the outline attached to the wound base. There is large (67-100%) red, pink, friable granulation within the wound bed. There is no necrotic tissue within the wound bed. Assessment Active Problems ICD-10 Pressure ulcer of left buttock, stage 4 Pressure ulcer of right buttock, stage 4 Pressure ulcer of sacral region, stage 4 Pressure ulcer of other site, stage 2 Pressure ulcer of other site, stage 3 Paraplegia, incomplete Nicotine dependence, cigarettes, with other nicotine-induced disorders Abrasion of scrotum and testes, initial encounter Plan Follow-up Appointments: Return appointment in 1 month. - with [name] Shower/ Hygiene: May shower and wash wound with soap and water. Off-Loading: Low air-loss mattress (Group 2) Turn and reposition every 2 hours Other: - place folded towel under scrotum to elevate WOUND #19: - Sacrum Wound Laterality: Prim Dressing: KerraCel Ag Gelling Fiber Dressing, 4x5 in (silver alginate) (Dispense As Written) 1 x Per Day/30 Days ary Discharge Instructions: Apply silver alginate to wound bed, do not pack, just tuck lightly into wounds  as instructed Secondary Dressing: ABD Pad, 8x10 (Generic) 1 x Per Day/30 Days Discharge Instructions: Apply over primary dressing as directed. Secured With: 65M Medipore H Soft  Cloth Surgical T 4 x 2 (in/yd) (Generic) 1 x Per Day/30 Days ape Discharge Instructions: Secure dressing with tape as directed. WOUND #21: - Ischium Wound Laterality: Right, Medial Prim Dressing: KerraCel Ag Gelling Fiber Dressing, 4x5 in (silver alginate) (Dispense As Written) 1 x Per Day/30 Days ary Discharge Instructions: Apply silver alginate to wound bed, do not pack, just tuck lightly into wounds as instructed Secondary Dressing: ABD Pad, 8x10 (Generic) 1 x Per Day/30 Days Discharge Instructions: Apply over primary dressing as directed. Secured With: 65M Medipore H Soft Cloth Surgical T 4 x 2 (in/yd) (Generic) 1 x Per Day/30 Days ape Discharge Instructions: Secure dressing with tape as directed. WOUND #22: - Ischium Wound Laterality: Left Prim Dressing: KerraCel Ag Gelling Fiber Dressing, 4x5 in (silver alginate) (Dispense As Written) 1 x Per Day/30 Days ary Discharge Instructions: Apply silver alginate to wound bed, do not pack, just tuck lightly into wounds as instructed Secondary Dressing: ABD Pad, 8x10 (Generic) 1 x Per Day/30 Days Discharge Instructions: Apply over primary dressing as directed. Secured With: 65M Medipore H Soft Cloth Surgical T 4 x 2 (in/yd) (Generic) 1 x Per Day/30 Days ape Discharge Instructions: Secure dressing with tape as directed. WOUND #27: - Scrotum Wound Laterality: Peri-Wound Care: Zinc Oxide Ointment 30g tube Every Other Day/30 Days Discharge Instructions: Apply Zinc Oxide to periwound with each dressing change Prim Dressing: KerraCel Ag Gelling Fiber Dressing, 4x5 in (silver alginate) (Dispense As Written) Every Other Day/30 Days ary Discharge Instructions: Apply silver alginate to wound bed as instructed Secondary Dressing: ABD Pad, 8x10 (Generic) Every Other Day/30 Days Discharge Instructions: Apply over primary dressing, may change daily to keep dry 1. Would recommend currently that going continue with the wound care measures as before. This  includes the use of the silver alginate dressing which I think are doing quite well for him. 2. I would also recommend currently that we going continue with the changing daily which I think is doing at home with on actually even order the supplies for him he has a friend who actually helps take care of him and perform the dressing changes which is a blessing. We will see patient back for reevaluation in 4 weeks here in the clinic. If anything worsens or changes patient will contact our office for additional recommendations. Electronic Signature(s) Signed: 05/09/2021 2:26:42 PM By: [name] Keeler PA-C Entered By: [name] on 05/09/2021 14:26:42 -------------------------------------------------------------------------------- SuperBill Details Patient Name: Date of Service: [name] RA [place], DA [name] 05/09/2021 Medical Record Number: [id number] Patient Account Number: [id number] Date of Birth/Sex: Treating RN: 1966-01-20 (55 y.o. [name] Primary Care Provider: Sanjuan [name] Other Clinician: Referring Provider: Treating Provider/Extender: [name] in Treatment: 196 Diagnosis Coding ICD-10 Codes Code Description [phone] Pressure ulcer of left buttock, stage 4 L89.314 Pressure ulcer of right buttock, stage 4 L89.154 Pressure ulcer of sacral region, stage 4 L89.892 Pressure ulcer of other site, stage 2 L89.893 Pressure ulcer of other site, stage 3 G82.22 Paraplegia, incomplete F17.218 Nicotine dependence, cigarettes, with other nicotine-induced disorders S30.813A Abrasion of scrotum and testes, initial encounter Facility Procedures CPT4 Code: [id number] Description: [id number] - WOUND CARE VISIT-LEV 5 EST PT Modifier: Quantity: 1 Physician Procedures : CPT4 Code Description Modifier [id number] - WC PHYS LEVEL 4 - EST PT ICD-10 Diagnosis Description L89.324 Pressure  ulcer of left buttock, stage 4 L89.314 Pressure ulcer of right buttock, stage 4  L89.892 Pressure ulcer of other site, stage 2 L89.154  Pressure ulcer of sacral region, stage 4 Quantity: 1 Electronic Signature(s) Signed: 05/09/2021 2:26:58 PM By: [name] Keeler PA-C Entered By: [name] on 05/09/2021 14:26:57

## 2021-05-10 NOTE — Progress Notes (Addendum)
[name], [name] ([id number]) Visit Report for 05/09/2021 Arrival Information Details Patient Name: Date of Service: [name] [name] 05/09/2021 10:45 A M Medical Record Number: [id number] Patient Account Number: [id number] Date of Birth/Sex: Treating RN: 01-Jul-1966 (55 y.o. [name] Primary Care Shoji Pertuit: [name] Other Clinician: Referring Ame Heagle: Treating Ketra Duchesne/Extender: Lenox Ahr in Treatment: 65 Visit Information History Since Last Visit Added or deleted any medications: No Patient Arrived: Wheel Chair Any new allergies or adverse reactions: No Arrival Time: 10:08 Had a fall or experienced change in No Accompanied By: friend activities of daily living that may affect Transfer Assistance: None risk of falls: Patient Identification Verified: Yes Signs or symptoms of abuse/neglect since last visito No Secondary Verification Process Completed: Yes Hospitalized since last visit: No Patient Requires Transmission-Based Precautions: No Implantable device outside of the clinic excluding No Patient Has Alerts: No cellular tissue based products placed in the center since last visit: Has Dressing in Place as Prescribed: Yes Pain Present Now: Yes Electronic Signature(s) Signed: 05/10/2021 5:39:27 PM By: Baruch Gouty RN, BSN Entered By: Baruch Gouty on 05/09/2021 10:17:33 -------------------------------------------------------------------------------- Clinic Level of Care Assessment Details Patient Name: Date of Service: [name] RA [place], [name] 05/09/2021 10:45 A M Medical Record Number: [id number] Patient Account Number: [id number] Date of Birth/Sex: Treating RN: [birth year] (55 y.o. [name] Primary Care Shantae Vantol: [name] Other Clinician: Referring Maliya Marich: Treating Brandie Lopes/Extender: Lenox Ahr in Treatment: 196 Clinic Level of Care Assessment Items TOOL 4 Quantity Score X- 1  0 Use when only an EandM is performed on FOLLOW-UP visit ASSESSMENTS - Nursing Assessment / Reassessment X- 1 10 Reassessment of Co-morbidities (includes updates in patient status) X- 1 5 Reassessment of Adherence to Treatment Plan ASSESSMENTS - Wound and Skin A ssessment / Reassessment '[]'$  - 0 Simple Wound Assessment / Reassessment - one wound X- 4 5 Complex Wound Assessment / Reassessment - multiple wounds '[]'$  - 0 Dermatologic / Skin Assessment (not related to wound area) ASSESSMENTS - Focused Assessment '[]'$  - 0 Circumferential Edema Measurements - multi extremities '[]'$  - 0 Nutritional Assessment / Counseling / Intervention '[]'$  - 0 Lower Extremity Assessment (monofilament, tuning fork, pulses) '[]'$  - 0 Peripheral Arterial Disease Assessment (using hand held doppler) ASSESSMENTS - Ostomy and/or Continence Assessment and Care '[]'$  - 0 Incontinence Assessment and Management '[]'$  - 0 Ostomy Care Assessment and Management (repouching, etc.) PROCESS - Coordination of Care '[]'$  - 0 Simple Patient / Family Education for ongoing care X- 1 20 Complex (extensive) Patient / Family Education for ongoing care '[]'$  - 0 Staff obtains Programmer, systems, Records, T Results / Process Orders est '[]'$  - 0 Staff telephones HHA, Nursing Homes / Clarify orders / etc '[]'$  - 0 Routine Transfer to another Facility (non-emergent condition) '[]'$  - 0 Routine Hospital Admission (non-emergent condition) '[]'$  - 0 New Admissions / Biomedical engineer / Ordering NPWT Apligraf, etc. , '[]'$  - 0 Emergency Hospital Admission (emergent condition) '[]'$  - 0 Simple Discharge Coordination '[]'$  - 0 Complex (extensive) Discharge Coordination PROCESS - Special Needs '[]'$  - 0 Pediatric / Minor Patient Management '[]'$  - 0 Isolation Patient Management '[]'$  - 0 Hearing / Language / Visual special needs '[]'$  - 0 Assessment of Community assistance (transportation, D/C planning, etc.) '[]'$  - 0 Additional assistance / Altered mentation '[]'$  -  0 Support Surface(s) Assessment (bed, cushion, seat, etc.) INTERVENTIONS - Wound Cleansing / Measurement '[]'$  - 0 Simple Wound Cleansing - one wound X- 4 5 Complex Wound Cleansing -  multiple wounds X- 1 5 Wound Imaging (photographs - any number of wounds) '[]'$  - 0 Wound Tracing (instead of photographs) '[]'$  - 0 Simple Wound Measurement - one wound X- 4 5 Complex Wound Measurement - multiple wounds INTERVENTIONS - Wound Dressings '[]'$  - 0 Small Wound Dressing one or multiple wounds X- 4 15 Medium Wound Dressing one or multiple wounds '[]'$  - 0 Large Wound Dressing one or multiple wounds '[]'$  - 0 Application of Medications - topical '[]'$  - 0 Application of Medications - injection INTERVENTIONS - Miscellaneous '[]'$  - 0 External ear exam '[]'$  - 0 Specimen Collection (cultures, biopsies, blood, body fluids, etc.) '[]'$  - 0 Specimen(s) / Culture(s) sent or taken to Lab for analysis '[]'$  - 0 Patient Transfer (multiple staff / Civil Service fast streamer / Similar devices) '[]'$  - 0 Simple Staple / Suture removal (25 or less) '[]'$  - 0 Complex Staple / Suture removal (26 or more) '[]'$  - 0 Hypo / Hyperglycemic Management (close monitor of Blood Glucose) '[]'$  - 0 Ankle / Brachial Index (ABI) - do not check if billed separately X- 1 5 Vital Signs Has the patient been seen at the hospital within the last three years: Yes Total Score: 165 Level Of Care: New/Established - Level 5 Electronic Signature(s) Signed: 05/09/2021 5:53:47 PM By: Lorrin Jackson Entered By: Lorrin Jackson on 05/09/2021 10:38:37 -------------------------------------------------------------------------------- Encounter Discharge Information Details Patient Name: Date of Service: [name] RA [place], [name] 05/09/2021 10:45 A M Medical Record Number: [id number] Patient Account Number: [id number] Date of Birth/Sex: Treating RN: [birth year] (55 y.o. [name] Primary Care Choua Ikner: [name] Other Clinician: Referring Sage Kopera: Treating  Amirr Achord/Extender: Lenox Ahr in Treatment: 196 Encounter Discharge Information Items Discharge Condition: Stable Ambulatory Status: Wheelchair Discharge Destination: Home Transportation: Other Accompanied By: caregiver Schedule Follow-up Appointment: Yes Clinical Summary of Care: Patient Declined Notes SCAT Electronic Signature(s) Signed: 05/10/2021 5:39:27 PM By: Baruch Gouty RN, BSN Entered By: Baruch Gouty on 05/09/2021 10:52:28 -------------------------------------------------------------------------------- Lower Extremity Assessment Details Patient Name: Date of Service: [name] RA [place], [name] 05/09/2021 10:45 A M Medical Record Number: [id number] Patient Account Number: [id number] Date of Birth/Sex: Treating RN: Jan 23, 1966 (55 y.o. [name] Primary Care Lavel Rieman: [name] Other Clinician: Referring Eladia Frame: Treating Javares Kaufhold/Extender: Lenox Ahr in Treatment: [place] Signature(s) Signed: 05/10/2021 5:39:27 PM By: Baruch Gouty RN, BSN Entered By: Baruch Gouty on 05/09/2021 10:20:56 -------------------------------------------------------------------------------- Multi Wound Chart Details Patient Name: Date of Service: [name] RA [place], [name] 05/09/2021 10:45 A M Medical Record Number: [id number] Patient Account Number: [id number] Date of Birth/Sex: Treating RN: [birth year] (55 y.o. [name] Primary Care Cyan Clippinger: [name] Other Clinician: Referring Melton Walls: Treating Lynzee Lindquist/Extender: Lenox Ahr in Treatment: 196 Vital Signs Height(in): 72 Pulse(bpm): 98 Weight(lbs): 215 Blood Pressure(mmHg): 126/76 Body Mass Index(BMI): 29 Temperature(F): 98.1 Respiratory Rate(breaths/min): 20 Photos: Sacrum Right, Medial Ischium Left Ischium Wound Location: Pressure Injury Gradually Appeared Gradually Appeared Wounding Event: Pressure Ulcer  Pressure Ulcer Pressure Ulcer Primary Etiology: Anemia, Hypertension, Colitis, Anemia, Hypertension, Colitis, Anemia, Hypertension, Colitis, Comorbid History: Osteomyelitis, Paraplegia Osteomyelitis, Paraplegia Osteomyelitis, Paraplegia 10/02/2013 10/02/2012 10/02/2013 Date Acquired: 196 161 161 Weeks of Treatment: Open Open Open Wound Status: No No No Clustered Wound: N/A N/A N/A Clustered Quantity: 1x1x0.8 0.5x1.1x1.5 2.2x0.8x1.9 Measurements L x W x D (cm) 0.785 0.432 1.382 A (cm) : rea 0.628 0.648 2.626 Volume (cm) : 63.20% 84.70% -225.90% % Reduction in A rea: 77.40% 88.50% -121.00% % Reduction in Volume: 11 Position 1 (  o'clock): 4.2 Maximum Distance 1 (cm): 1 Starting Position 1 (o'clock): 11 Ending Position 1 (o'clock): 0.9 Maximum Distance 1 (cm): No No Yes Tunneling: Yes No N/A Undermining: Category/Stage IV Category/Stage II Category/Stage II Classification: Medium Small Medium Exudate A mount: Serosanguineous Serosanguineous Serosanguineous Exudate Type: red, brown red, brown red, brown Exudate Color: Fibrotic scar, thickened scar Well defined, not attached Epibole Wound Margin: Large (67-100%) Large (67-100%) Large (67-100%) Granulation A mount: Pink, Pale Pink Pink, Pale Granulation Quality: None Present (0%) None Present (0%) None Present (0%) Necrotic A mount: Fat Layer (Subcutaneous Tissue): Yes Fat Layer (Subcutaneous Tissue): Yes Fat Layer (Subcutaneous Tissue): Yes Exposed Structures: Fascia: No Fascia: No Fascia: No Tendon: No Tendon: No Tendon: No Muscle: No Muscle: No Muscle: No Joint: No Joint: No Joint: No Bone: No Bone: No Bone: No Medium (34-66%) Large (67-100%) Medium (34-66%) Epithelialization: Wound Number: 27 N/A N/A Photos: N/A N/A Scrotum N/A N/A Wound Location: Pressure Injury N/A N/A Wounding Event: Pressure Ulcer N/A N/A Primary Etiology: Anemia, Hypertension, Colitis, N/A N/A Comorbid  History: Osteomyelitis, Paraplegia 02/23/2021 N/A N/A Date Acquired: 10 N/A N/A Weeks of Treatment: Open N/A N/A Wound Status: Yes N/A N/A Clustered Wound: 1 N/A N/A Clustered Quantity: 2.5x2.4x0.1 N/A N/A Measurements L x W x D (cm) 4.712 N/A N/A A (cm) : rea 0.471 N/A N/A Volume (cm) : 92.50% N/A N/A % Reduction in A rea: 92.50% N/A N/A % Reduction in Volume: No N/A N/A Tunneling: No N/A N/A Undermining: Category/Stage III N/A N/A Classification: Medium N/A N/A Exudate A mount: Serosanguineous N/A N/A Exudate Type: red, brown N/A N/A Exudate Color: Distinct, outline attached N/A N/A Wound Margin: Large (67-100%) N/A N/A Granulation A mount: Red, Pink, Friable N/A N/A Granulation Quality: None Present (0%) N/A N/A Necrotic A mount: Fat Layer (Subcutaneous Tissue): Yes N/A N/A Exposed Structures: Fascia: No Tendon: No Muscle: No Joint: No Bone: No Medium (34-66%) N/A N/A Epithelialization: Treatment Notes Wound #19 (Sacrum) Cleanser Peri-Wound Care Topical Primary Dressing KerraCel Ag Gelling Fiber Dressing, 4x5 in (silver alginate) Discharge Instruction: Apply silver alginate to wound bed, do not pack, just tuck lightly into wounds as instructed Secondary Dressing ABD Pad, 8x10 Discharge Instruction: Apply over primary dressing as directed. Secured With 32M Medipore H Soft Cloth Surgical T 4 x 2 (in/yd) ape Discharge Instruction: Secure dressing with tape as directed. Compression Wrap Compression Stockings Add-Ons Wound #21 (Ischium) Wound Laterality: Right, Medial Cleanser Peri-Wound Care Topical Primary Dressing KerraCel Ag Gelling Fiber Dressing, 4x5 in (silver alginate) Discharge Instruction: Apply silver alginate to wound bed, do not pack, just tuck lightly into wounds as instructed Secondary Dressing ABD Pad, 8x10 Discharge Instruction: Apply over primary dressing as directed. Secured With 32M Medipore H Soft Cloth Surgical T 4 x  2 (in/yd) ape Discharge Instruction: Secure dressing with tape as directed. Compression Wrap Compression Stockings Add-Ons Wound #22 (Ischium) Wound Laterality: Left Cleanser Peri-Wound Care Topical Primary Dressing KerraCel Ag Gelling Fiber Dressing, 4x5 in (silver alginate) Discharge Instruction: Apply silver alginate to wound bed, do not pack, just tuck lightly into wounds as instructed Secondary Dressing ABD Pad, 8x10 Discharge Instruction: Apply over primary dressing as directed. Secured With 32M Medipore H Soft Cloth Surgical T 4 x 2 (in/yd) ape Discharge Instruction: Secure dressing with tape as directed. Compression Wrap Compression Stockings Add-Ons Wound #27 (Scrotum) Cleanser Peri-Wound Care Zinc Oxide Ointment 30g tube Discharge Instruction: Apply Zinc Oxide to periwound with each dressing change Topical Primary Dressing KerraCel Ag Gelling Fiber Dressing, 4x5 in (  silver alginate) Discharge Instruction: Apply silver alginate to wound bed as instructed Secondary Dressing ABD Pad, 8x10 Discharge Instruction: Apply over primary dressing, may change daily to keep dry Secured With Compression Wrap Compression Stockings Add-Ons Electronic Signature(s) Signed: 06/30/2021 6:02:28 PM By: Baruch Gouty RN, BSN Entered By: Baruch Gouty on 06/29/2021 17:16:01 -------------------------------------------------------------------------------- [place] Details Patient Name: Date of Service: [name] RA [place], [name] 05/09/2021 10:45 A M Medical Record Number: [id number] Patient Account Number: [id number] Date of Birth/Sex: Treating RN: 11-04-1965 (55 y.o. [name] Primary Care Julia Kulzer: [name] Other Clinician: Referring Aaima Gaddie: Treating Sita Mangen/Extender: Lenox Ahr in Treatment: [place] reviewed with physician Active Inactive Pressure Nursing Diagnoses: Knowledge deficit  related to causes and risk factors for pressure ulcer development Knowledge deficit related to management of pressures ulcers Potential for impaired tissue integrity related to pressure, friction, moisture, and shear Goals: Patient will remain free from development of additional pressure ulcers Date Initiated: 12/12/2017 Date Inactivated: 08/14/2018 Target Resolution Date: 08/21/2018 Goal Status: Unmet Unmet Reason: hospitalized Patient will remain free of pressure ulcers Date Initiated: 12/12/2017 Date Inactivated: 02/06/2018 Target Resolution Date: 02/06/2018 Unmet Reason: pt noncompliant wth Goal Status: Unmet offloading Patient/caregiver will verbalize understanding of pressure ulcer management Date Initiated: 12/12/2017 Target Resolution Date: 06/06/2021 Goal Status: Active Interventions: Assess: immobility, friction, shearing, incontinence upon admission and as needed Assess offloading mechanisms upon admission and as needed Assess potential for pressure ulcer upon admission and as needed Notes: Wound/Skin Impairment Nursing Diagnoses: Impaired tissue integrity Goals: Patient/caregiver will verbalize understanding of skin care regimen Date Initiated: 03/06/2018 Target Resolution Date: 06/06/2021 Goal Status: Active Ulcer/skin breakdown will heal within 14 weeks Date Initiated: 07/31/2017 Date Inactivated: 01/09/2018 Target Resolution Date: 11/30/2017 Unmet Reason: noncompliant with Goal Status: Unmet offloading Interventions: Assess patient/caregiver ability to perform ulcer/skin care regimen upon admission and as needed Notes: Electronic Signature(s) Signed: 05/09/2021 5:53:47 PM By: Lorrin Jackson Entered By: Lorrin Jackson on 05/09/2021 10:35:49 -------------------------------------------------------------------------------- Pain Assessment Details Patient Name: Date of Service: [name], [name] 05/09/2021 10:45 A M Medical Record Number: [id number] Patient Account Number:  [id number] Date of Birth/Sex: Treating RN: [birth year] (55 y.o. [name] Primary Care Lateesha Bezold: [name] Other Clinician: Referring [employer]: Treating Mehdi Gironda/Extender: Lenox Ahr in Treatment: 196 Active Problems Location of Pain Severity and Description of Pain Patient Has Paino Yes Site Locations Pain Location: Pain Location: Pain in Ulcers Duration of the Pain. Constant / Intermittento Intermittent Rate the pain. Current Pain Level: 8 Worst Pain Level: 9 Least Pain Level: 0 Character of Pain Describe the Pain: Throbbing Pain Management and Medication Current Pain Management: Medication: Yes Other: reposition Rest: Yes Is the Current Pain Management Adequate: Adequate How does your wound impact your activities of daily livingo Sleep: Yes Bathing: No Appetite: No Relationship With Others: No Bladder Continence: No Emotions: No Bowel Continence: No Work: No Toileting: No Drive: No Dressing: No Hobbies: No Electronic Signature(s) Signed: 05/10/2021 5:39:27 PM By: Baruch Gouty RN, BSN Entered By: Baruch Gouty on 05/09/2021 10:20:46 -------------------------------------------------------------------------------- Patient/Caregiver Education Details Patient Name: Date of Service: [name], [name] 8/8/2022andnbsp10:45 A M Medical Record Number: [id number] Patient Account Number: [id number] Date of Birth/Gender: Treating RN: May 20, 1966 (55 y.o. [name] Primary Care Physician: [name] Other Clinician: Referring Physician: Treating Physician/Extender: Lenox Ahr in Treatment: 196 Education Assessment Education Provided To: Patient Education Topics Provided Pressure: Methods: Explain/Verbal, Printed Responses: State content correctly [employer]  Impairment: Methods: Explain/Verbal, Printed Responses: State content correctly Electronic Signature(s) Signed:  05/09/2021 5:53:47 PM By: Lorrin Jackson Signed: 05/09/2021 5:53:47 PM By: Lorrin Jackson Entered By: Lorrin Jackson on 05/09/2021 10:36:17 -------------------------------------------------------------------------------- Wound Assessment Details Patient Name: Date of Service: [name] RA [place], [name] 05/09/2021 10:45 A M Medical Record Number: [id number] Patient Account Number: [id number] Date of Birth/Sex: Treating RN: 10/07/1965 (55 y.o. [name] Primary Care Celestina Gironda: [name] Other Clinician: Referring Jarrin Staley: Treating Reathel Turi/Extender: Lenox Ahr in Treatment: 196 Wound Status Wound Number: 19 Primary Etiology: Pressure Ulcer Wound Location: Sacrum Wound Status: Open Wounding Event: Pressure Injury Comorbid History: Anemia, Hypertension, Colitis, Osteomyelitis, Paraplegia Date Acquired: 10/02/2013 Weeks Of Treatment: 196 Clustered Wound: No Photos Wound Measurements Length: (cm) 1 Width: (cm) 1 Depth: (cm) 0.8 Area: (cm) 0.785 Volume: (cm) 0.628 % Reduction in Area: 63.2% % Reduction in Volume: 77.4% Epithelialization: Medium (34-66%) Tunneling: No Undermining: Yes Starting Position (o'clock): 1 Ending Position (o'clock): 11 Maximum Distance: (cm) 0.9 Wound Description Classification: Category/Stage IV Wound Margin: Fibrotic scar, thickened scar Exudate Amount: Medium Exudate Type: Serosanguineous Exudate Color: red, brown Foul Odor After Cleansing: No Slough/Fibrino No Wound Bed Granulation Amount: Large (67-100%) Exposed Structure Granulation Quality: Pink, Pale Fascia Exposed: No Necrotic Amount: None Present (0%) Fat Layer (Subcutaneous Tissue) Exposed: Yes Tendon Exposed: No Muscle Exposed: No Joint Exposed: No Bone Exposed: No Treatment Notes Wound #19 (Sacrum) Cleanser Peri-Wound Care Topical Primary Dressing KerraCel Ag Gelling Fiber Dressing, 4x5 in (silver alginate) Discharge Instruction: Apply  silver alginate to wound bed, do not pack, just tuck lightly into wounds as instructed Secondary Dressing ABD Pad, 8x10 Discharge Instruction: Apply over primary dressing as directed. Secured With 73M Medipore H Soft Cloth Surgical T 4 x 2 (in/yd) ape Discharge Instruction: Secure dressing with tape as directed. Compression Wrap Compression Stockings Add-Ons Electronic Signature(s) Signed: 05/09/2021 5:35:12 PM By: Deon Pilling Signed: 05/10/2021 5:39:27 PM By: Baruch Gouty RN, BSN Entered By: Deon Pilling on 05/09/2021 10:30:47 -------------------------------------------------------------------------------- Wound Assessment Details Patient Name: Date of Service: [name] RA [place], [name] 05/09/2021 10:45 A M Medical Record Number: [id number] Patient Account Number: [id number] Date of Birth/Sex: Treating RN: Aug 10, 1966 (55 y.o. [name] Primary Care Berneda Piccininni: [name] Other Clinician: Referring Brandom Kerwin: Treating Regina Ganci/Extender: Lenox Ahr in Treatment: 196 Wound Status Wound Number: 21 Primary Etiology: Pressure Ulcer Wound Location: Right, Medial Ischium Wound Status: Open Wounding Event: Gradually Appeared Comorbid History: Anemia, Hypertension, Colitis, Osteomyelitis, Paraplegia Date Acquired: 10/02/2012 Weeks Of Treatment: 161 Clustered Wound: No Photos Wound Measurements Length: (cm) 0.5 Width: (cm) 1.1 Depth: (cm) 1.5 Area: (cm) 0.432 Volume: (cm) 0.648 % Reduction in Area: 84.7% % Reduction in Volume: 88.5% Epithelialization: Large (67-100%) Tunneling: No Undermining: No Wound Description Classification: Category/Stage II Wound Margin: Well defined, not attached Exudate Amount: Small Exudate Type: Serosanguineous Exudate Color: red, brown Foul Odor After Cleansing: No Slough/Fibrino No Wound Bed Granulation Amount: Large (67-100%) Exposed Structure Granulation Quality: Pink Fascia Exposed: No Necrotic  Amount: None Present (0%) Fat Layer (Subcutaneous Tissue) Exposed: Yes Tendon Exposed: No Muscle Exposed: No Joint Exposed: No Bone Exposed: No Treatment Notes Wound #21 (Ischium) Wound Laterality: Right, Medial Cleanser Peri-Wound Care Topical Primary Dressing KerraCel Ag Gelling Fiber Dressing, 4x5 in (silver alginate) Discharge Instruction: Apply silver alginate to wound bed, do not pack, just tuck lightly into wounds as instructed Secondary Dressing ABD Pad, 8x10 Discharge Instruction: Apply over primary dressing as directed. Secured With [place]  Surgical T 4 x 2 (in/yd) ape Discharge Instruction: Secure dressing with tape as directed. Compression Wrap Compression Stockings Add-Ons Electronic Signature(s) Signed: 05/09/2021 5:35:12 PM By: Deon Pilling Signed: 05/10/2021 5:39:27 PM By: Baruch Gouty RN, BSN Entered By: Deon Pilling on 05/09/2021 10:28:55 -------------------------------------------------------------------------------- Wound Assessment Details Patient Name: Date of Service: [name] RA [name], [name] 05/09/2021 10:45 A M Medical Record Number: [id number] Patient Account Number: [id number] Date of Birth/Sex: Treating RN: Jul 01, 1966 (55 y.o. [name] Primary Care Lysandra Loughmiller: [name] Other Clinician: Referring Yareth Macdonnell: Treating Navpreet Szczygiel/Extender: Lenox Ahr in Treatment: 196 Wound Status Wound Number: 22 Primary Etiology: Pressure Ulcer Wound Location: Left Ischium Wound Status: Open Wounding Event: Gradually Appeared Comorbid History: Anemia, Hypertension, Colitis, Osteomyelitis, Paraplegia Date Acquired: 10/02/2013 Weeks Of Treatment: 161 Clustered Wound: No Photos Wound Measurements Length: (cm) 2.2 Width: (cm) 0.8 Depth: (cm) 1.9 Area: (cm) 1.382 Volume: (cm) 2.626 % Reduction in Area: -225.9% % Reduction in Volume: -121% Epithelialization: Medium (34-66%) Tunneling: Yes Position  (o'clock): 11 Maximum Distance: (cm) 4.2 Wound Description Classification: Category/Stage II Wound Margin: Epibole Exudate Amount: Medium Exudate Type: Serosanguineous Exudate Color: red, brown Foul Odor After Cleansing: No Slough/Fibrino No Wound Bed Granulation Amount: Large (67-100%) Exposed Structure Granulation Quality: Pink, Pale Fascia Exposed: No Necrotic Amount: None Present (0%) Fat Layer (Subcutaneous Tissue) Exposed: Yes Tendon Exposed: No Muscle Exposed: No Joint Exposed: No Bone Exposed: No Treatment Notes Wound #22 (Ischium) Wound Laterality: Left Cleanser Peri-Wound Care Topical Primary Dressing KerraCel Ag Gelling Fiber Dressing, 4x5 in (silver alginate) Discharge Instruction: Apply silver alginate to wound bed, do not pack, just tuck lightly into wounds as instructed Secondary Dressing ABD Pad, 8x10 Discharge Instruction: Apply over primary dressing as directed. Secured With 55M Medipore H Soft Cloth Surgical T 4 x 2 (in/yd) ape Discharge Instruction: Secure dressing with tape as directed. Compression Wrap Compression Stockings Add-Ons Electronic Signature(s) Signed: 05/09/2021 5:35:12 PM By: Deon Pilling Signed: 05/10/2021 5:39:27 PM By: Baruch Gouty RN, BSN Entered By: Deon Pilling on 05/09/2021 10:29:28 -------------------------------------------------------------------------------- Wound Assessment Details Patient Name: Date of Service: [name] RA [name], [name] 05/09/2021 10:45 A M Medical Record Number: [id number] Patient Account Number: [id number] Date of Birth/Sex: Treating RN: 30-Jun-1966 (55 y.o. [name] Primary Care Caliber Landess: [name] Other Clinician: Referring Alam Guterrez: Treating Calden Dorsey/Extender: Lenox Ahr in Treatment: 196 Wound Status Wound Number: 27 Primary Etiology: Pressure Ulcer Wound Location: Scrotum Wound Status: Open Wounding Event: Pressure Injury Comorbid History: Anemia,  Hypertension, Colitis, Osteomyelitis, Paraplegia Date Acquired: 02/23/2021 Weeks Of Treatment: 10 Clustered Wound: Yes Photos Wound Measurements Length: (cm) 2.5 Width: (cm) 2.4 Depth: (cm) 0.1 Clustered Quantity: 1 Area: (cm) 4.712 Volume: (cm) 0.471 % Reduction in Area: 92.5% % Reduction in Volume: 92.5% Epithelialization: Medium (34-66%) Tunneling: No Undermining: No Wound Description Classification: Category/Stage III Wound Margin: Distinct, outline attached Exudate Amount: Medium Exudate Type: Serosanguineous Exudate Color: red, brown Foul Odor After Cleansing: No Slough/Fibrino No Wound Bed Granulation Amount: Large (67-100%) Exposed Structure Granulation Quality: Red, Pink, Friable Fascia Exposed: No Necrotic Amount: None Present (0%) Fat Layer (Subcutaneous Tissue) Exposed: Yes Tendon Exposed: No Muscle Exposed: No Joint Exposed: No Bone Exposed: No Treatment Notes Wound #27 (Scrotum) Cleanser Peri-Wound Care Zinc Oxide Ointment 30g tube Discharge Instruction: Apply Zinc Oxide to periwound with each dressing change Topical Primary Dressing KerraCel Ag Gelling Fiber Dressing, 4x5 in (silver alginate) Discharge Instruction: Apply silver alginate to wound bed as instructed Secondary Dressing ABD Pad, 8x10 Discharge Instruction:  Apply over primary dressing, may change daily to keep dry Secured With Compression Wrap Compression Stockings Add-Ons Electronic Signature(s) Signed: 05/09/2021 5:35:12 PM By: Deon Pilling Signed: 05/10/2021 5:39:27 PM By: Baruch Gouty RN, BSN Entered By: Deon Pilling on 05/09/2021 10:30:05 -------------------------------------------------------------------------------- Vitals Details Patient Name: Date of Service: [name] RA [name], [name] 05/09/2021 10:45 A M Medical Record Number: [id number] Patient Account Number: [id number] Date of Birth/Sex: Treating RN: 09-02-1966 (55 y.o. [name] Primary Care Deetta Siegmann: [name] Other Clinician: Referring Mohan Erven: Treating Kae Lauman/Extender: Lenox Ahr in Treatment: 196 Vital Signs Time Taken: 10:18 Temperature (F): 98.1 Height (in): 72 Pulse (bpm): 98 Source: Stated Respiratory Rate (breaths/min): 20 Weight (lbs): 215 Blood Pressure (mmHg): 126/76 Source: Stated Reference Range: 80 - 120 mg / dl Body Mass Index (BMI): 29.2 Electronic Signature(s) Signed: 05/10/2021 5:39:27 PM By: Baruch Gouty RN, BSN Entered By: Baruch Gouty on 05/09/2021 10:19:36

## 2021-05-20 ENCOUNTER — Ambulatory Visit (INDEPENDENT_AMBULATORY_CARE_PROVIDER_SITE_OTHER): Payer: Medicare Other | Admitting: Gastroenterology

## 2021-05-20 ENCOUNTER — Encounter: Payer: Self-pay | Admitting: Gastroenterology

## 2021-05-20 VITALS — BP 100/60 | HR 73 | Ht 72.0 in | Wt 382.0 lb

## 2021-05-20 DIAGNOSIS — Z1211 Encounter for screening for malignant neoplasm of colon: Secondary | ICD-10-CM | POA: Diagnosis not present

## 2021-05-20 DIAGNOSIS — K5903 Drug induced constipation: Secondary | ICD-10-CM | POA: Diagnosis not present

## 2021-05-20 DIAGNOSIS — R140 Abdominal distension (gaseous): Secondary | ICD-10-CM | POA: Diagnosis not present

## 2021-05-20 DIAGNOSIS — T402X5A Adverse effect of other opioids, initial encounter: Secondary | ICD-10-CM

## 2021-05-20 MED ORDER — LUBIPROSTONE 24 MCG PO CAPS: 24.0000 ug | ORAL_CAPSULE | Freq: Two times a day (BID) | ORAL | 3 refills | Status: DC
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
## 2021-05-20 NOTE — Patient Instructions (Addendum)
We have sent the following medications to your pharmacy for you to pick up at your convenience: Amitiza 24 mcg twice daily.   Stop gentle laxative.   You have been scheduled for a colonoscopy. Please follow written instructions given to you at your visit today.  Please pick up your prep supplies at the pharmacy within the next 1-3 days. If you use inhalers (even only as needed), please bring them with you on the day of your procedure.  If you are age 57 or older, your body mass index should be between 23-30. Your Body mass index is 51.81 kg/m. If this is out of the aforementioned range listed, please consider follow up with your Primary Care Provider.  If you are age 11 or younger, your body mass index should be between 19-25. Your Body mass index is 51.81 kg/m. If this is out of the aformentioned range listed, please consider follow up with your Primary Care Provider.   __________________________________________________________  The [facility] providers would like to encourage you to use [facility] to communicate with providers for non-urgent requests or questions.  Due to long hold times on the telephone, sending your provider a message by [facility] may be a faster and more efficient way to get a response.  Please allow 48 business hours for a response.  Please remember that this is for non-urgent requests.

## 2021-05-22 NOTE — Progress Notes (Signed)
Agree with assessment and plan as outlined. His case needs to be done at the hospital given his immobility.   Jess I don't think he is scheduled looking at his chart, can you clarify if nursing staff took care of this? Thanks

## 2021-06-02 DIAGNOSIS — G4733 Obstructive sleep apnea (adult) (pediatric): Secondary | ICD-10-CM | POA: Diagnosis not present

## 2021-06-05 ENCOUNTER — Ambulatory Visit (INDEPENDENT_AMBULATORY_CARE_PROVIDER_SITE_OTHER): Payer: Medicare Other | Admitting: Student

## 2021-06-05 DIAGNOSIS — Z0000 Encounter for general adult medical examination without abnormal findings: Secondary | ICD-10-CM
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
## 2021-06-05 NOTE — Patient Instructions (Signed)
Health Maintenance, Male Adopting a healthy lifestyle and getting preventive care are important in promoting health and wellness. Ask your health care provider about: The right schedule for you to have regular tests and exams. Things you can do on your own to prevent diseases and keep yourself healthy. What should I know about diet, weight, and exercise? Eat a healthy diet  Eat a diet that includes plenty of vegetables, fruits, low-fat dairy products, and lean protein. Do not eat a lot of foods that are high in solid fats, added sugars, or sodium. Maintain a healthy weight Body mass index (BMI) is a measurement that can be used to identify possible weight problems. It estimates body fat based on height and weight. Your health care provider can help determine your BMI and help you achieve or maintain a healthy weight. Get regular exercise Get regular exercise. This is one of the most important things you can do for your health. Most adults should: Exercise for at least 150 minutes each week. The exercise should increase your heart rate and make you sweat (moderate-intensity exercise). Do strengthening exercises at least twice a week. This is in addition to the moderate-intensity exercise. Spend less time sitting. Even light physical activity can be beneficial. Watch cholesterol and blood lipids Have your blood tested for lipids and cholesterol at 57 years of age, then have this test every 5 years. You may need to have your cholesterol levels checked more often if: Your lipid or cholesterol levels are high. You are older than 57 years of age. You are at high risk for heart disease. What should I know about cancer screening? Many types of cancers can be detected early and may often be prevented. Depending on your health history and family history, you may need to have cancer screening at various ages. This may include screening for: Colorectal cancer. Prostate cancer. Skin cancer. Lung  cancer. What should I know about heart disease, diabetes, and high blood pressure? Blood pressure and heart disease High blood pressure causes heart disease and increases the risk of stroke. This is more likely to develop in people who have high blood pressure readings, are of African descent, or are overweight. Talk with your health care provider about your target blood pressure readings. Have your blood pressure checked: Every 3-5 years if you are 18-39 years of age. Every year if you are 40 years old or older. If you are between the ages of 65 and 75 and are a current or former smoker, ask your health care provider if you should have a one-time screening for abdominal aortic aneurysm (AAA). Diabetes Have regular diabetes screenings. This checks your fasting blood sugar level. Have the screening done: Once every three years after age 45 if you are at a normal weight and have a low risk for diabetes. More often and at a younger age if you are overweight or have a high risk for diabetes. What should I know about preventing infection? Hepatitis B If you have a higher risk for hepatitis B, you should be screened for this virus. Talk with your health care provider to find out if you are at risk for hepatitis B infection. Hepatitis C Blood testing is recommended for: Everyone born from 1945 through 1965. Anyone with known risk factors for hepatitis C. Sexually transmitted infections (STIs) You should be screened each year for STIs, including gonorrhea and chlamydia, if: You are sexually active and are younger than 57 years of age. You are older than 57 years   of age and your health care provider tells you that you are at risk for this type of infection. Your sexual activity has changed since you were last screened, and you are at increased risk for chlamydia or gonorrhea. Ask your health care provider if you are at risk. Ask your health care provider about whether you are at high risk for HIV.  Your health care provider may recommend a prescription medicine to help prevent HIV infection. If you choose to take medicine to prevent HIV, you should first get tested for HIV. You should then be tested every 3 months for as long as you are taking the medicine. Follow these instructions at home: Lifestyle Do not use any products that contain nicotine or tobacco, such as cigarettes, e-cigarettes, and chewing tobacco. If you need help quitting, ask your health care provider. Do not use street drugs. Do not share needles. Ask your health care provider for help if you need support or information about quitting drugs. Alcohol use Do not drink alcohol if your health care provider tells you not to drink. If you drink alcohol: Limit how much you have to 0-2 drinks a day. Be aware of how much alcohol is in your drink. In the [place] one drink equals one 12 oz bottle of beer (355 mL), one 5 oz glass of wine (148 mL), or one 1 oz glass of hard liquor (44 mL). General instructions Schedule regular health, dental, and eye exams. Stay current with your vaccines. Tell your health care provider if: You often feel depressed. You have ever been abused or do not feel safe at home. Summary Adopting a healthy lifestyle and getting preventive care are important in promoting health and wellness. Follow your health care provider's instructions about healthy diet, exercising, and getting tested or screened for diseases. Follow your health care provider's instructions on monitoring your cholesterol and blood pressure. This information is not intended to replace advice given to you by your health care provider. Make sure you discuss any questions you have with your health care provider. Document Revised: 11/26/2020 Document Reviewed: 09/11/2018 Elsevier Patient Education  2022 Elsevier Inc.  

## 2021-06-08 ENCOUNTER — Encounter (HOSPITAL_BASED_OUTPATIENT_CLINIC_OR_DEPARTMENT_OTHER): Payer: Medicare Other | Attending: Physician Assistant | Admitting: Physician Assistant

## 2021-06-09 ENCOUNTER — Telehealth: Payer: Self-pay | Admitting: *Deleted

## 2021-06-09 NOTE — Telephone Encounter (Signed)
Received fax from [employer] for power w/c repairs. Handed to [employer] for signatures.

## 2021-06-09 NOTE — Telephone Encounter (Signed)
Signed Practitioner's Standard Written Order for power w/c repairs faxed to Raechel Ache at [facility] [phone]. Fax confirmation receipt received. Hubbard Hartshorn, BSN, RN-BC

## 2021-06-15 ENCOUNTER — Encounter (HOSPITAL_COMMUNITY): Payer: Self-pay | Admitting: Gastroenterology

## 2021-06-15 ENCOUNTER — Other Ambulatory Visit: Payer: Self-pay

## 2021-06-17 DIAGNOSIS — L89154 Pressure ulcer of sacral region, stage 4: Secondary | ICD-10-CM | POA: Diagnosis not present

## 2021-06-17 DIAGNOSIS — L89314 Pressure ulcer of right buttock, stage 4: Secondary | ICD-10-CM | POA: Diagnosis not present

## 2021-06-17 DIAGNOSIS — Z933 Colostomy status: Secondary | ICD-10-CM | POA: Diagnosis not present

## 2021-06-17 DIAGNOSIS — N319 Neuromuscular dysfunction of bladder, unspecified: Secondary | ICD-10-CM | POA: Diagnosis not present

## 2021-06-18 DIAGNOSIS — L89154 Pressure ulcer of sacral region, stage 4: Secondary | ICD-10-CM | POA: Diagnosis not present

## 2021-06-18 DIAGNOSIS — L89314 Pressure ulcer of right buttock, stage 4: Secondary | ICD-10-CM | POA: Diagnosis not present

## 2021-06-18 DIAGNOSIS — N319 Neuromuscular dysfunction of bladder, unspecified: Secondary | ICD-10-CM | POA: Diagnosis not present

## 2021-06-18 DIAGNOSIS — Z933 Colostomy status: Secondary | ICD-10-CM | POA: Diagnosis not present

## 2021-06-20 ENCOUNTER — Telehealth: Payer: Self-pay | Admitting: Gastroenterology

## 2021-06-20 NOTE — Telephone Encounter (Signed)
Spoke with patient, he states that he has misplaced his instructions and does not know if he can get in his my chart. Advised that I will re-print his instructions and he can pick them up from the 3rd floor. Pt states that he is on his way now. Pt had no concerns at the end of the call.

## 2021-06-20 NOTE — Telephone Encounter (Signed)
Inbound call from pt requesting a call back stating he had questions regarding his procedure at [facility] for tomorrow. Please advise.

## 2021-06-21 ENCOUNTER — Ambulatory Visit (HOSPITAL_COMMUNITY): Payer: Medicare Other | Admitting: Certified Registered Nurse Anesthetist

## 2021-06-21 ENCOUNTER — Encounter (HOSPITAL_COMMUNITY)
Admission: RE | Disposition: A | Discharge status: Home / Self Care | Payer: Self-pay | Source: Home / Self Care | Attending: Gastroenterology

## 2021-06-21 ENCOUNTER — Encounter (HOSPITAL_COMMUNITY): Payer: Self-pay | Admitting: Gastroenterology

## 2021-06-21 ENCOUNTER — Other Ambulatory Visit: Payer: Self-pay

## 2021-06-21 ENCOUNTER — Ambulatory Visit (HOSPITAL_COMMUNITY)
Admission: RE | Admit: 2021-06-21 | Discharge: 2021-06-21 | Disposition: A | Discharge status: Home / Self Care | Payer: Medicare Other | Attending: Gastroenterology | Admitting: Gastroenterology

## 2021-06-21 DIAGNOSIS — K5903 Drug induced constipation: Secondary | ICD-10-CM | POA: Diagnosis not present

## 2021-06-21 DIAGNOSIS — Z7982 Long term (current) use of aspirin: Secondary | ICD-10-CM | POA: Insufficient documentation

## 2021-06-21 DIAGNOSIS — L89309 Pressure ulcer of unspecified buttock, unspecified stage: Secondary | ICD-10-CM

## 2021-06-21 DIAGNOSIS — D120 Benign neoplasm of cecum: Secondary | ICD-10-CM | POA: Diagnosis not present

## 2021-06-21 DIAGNOSIS — G8220 Paraplegia, unspecified: Secondary | ICD-10-CM | POA: Diagnosis not present

## 2021-06-21 DIAGNOSIS — L89899 Pressure ulcer of other site, unspecified stage: Secondary | ICD-10-CM | POA: Diagnosis not present

## 2021-06-21 DIAGNOSIS — L89154 Pressure ulcer of sacral region, stage 4: Secondary | ICD-10-CM | POA: Diagnosis not present

## 2021-06-21 DIAGNOSIS — K635 Polyp of colon: Secondary | ICD-10-CM | POA: Diagnosis not present

## 2021-06-21 DIAGNOSIS — Z933 Colostomy status: Secondary | ICD-10-CM | POA: Insufficient documentation

## 2021-06-21 DIAGNOSIS — Z87891 Personal history of nicotine dependence: Secondary | ICD-10-CM | POA: Diagnosis not present

## 2021-06-21 DIAGNOSIS — T402X5A Adverse effect of other opioids, initial encounter: Secondary | ICD-10-CM

## 2021-06-21 DIAGNOSIS — Z79899 Other long term (current) drug therapy: Secondary | ICD-10-CM | POA: Insufficient documentation

## 2021-06-21 DIAGNOSIS — Z809 Family history of malignant neoplasm, unspecified: Secondary | ICD-10-CM | POA: Diagnosis not present

## 2021-06-21 DIAGNOSIS — E43 Unspecified severe protein-calorie malnutrition: Secondary | ICD-10-CM | POA: Diagnosis not present

## 2021-06-21 DIAGNOSIS — Z1211 Encounter for screening for malignant neoplasm of colon: Secondary | ICD-10-CM | POA: Insufficient documentation

## 2021-06-21 DIAGNOSIS — R140 Abdominal distension (gaseous): Secondary | ICD-10-CM

## 2021-06-21 HISTORY — PX: COLONOSCOPY WITH PROPOFOL: SHX5780

## 2021-06-21 HISTORY — PX: POLYPECTOMY: SHX5525

## 2021-06-21 HISTORY — DX: Sleep apnea, unspecified: G47.30

## 2021-06-21 SURGERY — COLONOSCOPY WITH PROPOFOL
Anesthesia: Monitor Anesthesia Care

## 2021-06-21 MED ORDER — PROPOFOL 10 MG/ML IV BOLUS
INTRAVENOUS | Status: AC
  Filled 2021-06-21: qty 20

## 2021-06-21 MED ORDER — EPHEDRINE SULFATE-NACL 50-0.9 MG/10ML-% IV SOSY
PREFILLED_SYRINGE | INTRAVENOUS | Status: DC | PRN
  Administered 2021-06-21 (×2): 10 mg via INTRAVENOUS

## 2021-06-21 MED ORDER — PROPOFOL 1000 MG/100ML IV EMUL
INTRAVENOUS | Status: AC
  Filled 2021-06-21: qty 100

## 2021-06-21 MED ORDER — PHENYLEPHRINE HCL (PRESSORS) 10 MG/ML IV SOLN
INTRAVENOUS | Status: AC
  Filled 2021-06-21: qty 2

## 2021-06-21 MED ORDER — PHENYLEPHRINE HCL (PRESSORS) 10 MG/ML IV SOLN
INTRAVENOUS | Status: DC | PRN
  Administered 2021-06-21: 80 ug via INTRAVENOUS
  Administered 2021-06-21 (×4): 120 ug via INTRAVENOUS
  Administered 2021-06-21: 80 ug via INTRAVENOUS

## 2021-06-21 MED ORDER — PROPOFOL 500 MG/50ML IV EMUL
INTRAVENOUS | Status: DC | PRN
  Administered 2021-06-21: 100 ug/kg/min via INTRAVENOUS

## 2021-06-21 MED ORDER — SODIUM CHLORIDE 0.9 % IV SOLN: INTRAVENOUS | Status: DC

## 2021-06-21 MED ORDER — LACTATED RINGERS IV SOLN: INTRAVENOUS | Status: DC | PRN

## 2021-06-21 MED ORDER — LACTATED RINGERS IV SOLN: Freq: Once | INTRAVENOUS | Status: AC

## 2021-06-21 MED ORDER — LIDOCAINE 2% (20 MG/ML) 5 ML SYRINGE
INTRAMUSCULAR | Status: DC | PRN
  Administered 2021-06-21: 80 mg via INTRAVENOUS

## 2021-06-21 MED ORDER — PROPOFOL 10 MG/ML IV BOLUS
INTRAVENOUS | Status: DC | PRN
  Administered 2021-06-21: 20 mg via INTRAVENOUS

## 2021-06-21 SURGICAL SUPPLY — 21 items

## 2021-06-21 NOTE — Consult Note (Signed)
WOC consult requested to assist with leaking ostomy pouch during endoscopy procedure.  Pt is familiar to [facility] team from prevous visits.  He did not have any supplies from home available when pouch leak occurred.  Colostomy has been present several years, according to the EMR. Stoma is red and viable, 1 1/4 inches, slightly above skin level.  Applied barrier ring [name] # [phone]) and wafer [name] # [phone]) and pouch [name] # [phone]) and gave patient 3 extra sets of supplies for use after discharge. Please re-consult if further assistance is needed.  Thank-you,  Julien Girt MSN, [place], [place], [place], [place]

## 2021-06-21 NOTE — Transfer of Care (Signed)
Immediate Anesthesia Transfer of Care Note  Patient: [name]  Procedure(s) Performed: COLONOSCOPY WITH PROPOFOL POLYPECTOMY  Patient Location: PACU and Endoscopy Unit  Anesthesia Type:MAC  Level of Consciousness: awake, alert , oriented and patient cooperative  Airway & Oxygen Therapy: Patient Spontanous Breathing and Patient connected to face mask oxygen  Post-op Assessment: Report given to RN and Post -op Vital signs reviewed and stable  Post vital signs: Reviewed and stable  Last Vitals:  Vitals Value Taken Time  BP    Temp    Pulse 96 06/21/21 1005  Resp 9 06/21/21 1005  SpO2 100 % 06/21/21 1005  Vitals shown include unvalidated device data.  Last Pain:  Vitals:   06/21/21 0840  TempSrc: Oral  PainSc: 8          Complications: No notable events documented.

## 2021-06-21 NOTE — Anesthesia Postprocedure Evaluation (Signed)
Anesthesia Post Note  Patient: [name]) Performed: COLONOSCOPY WITH PROPOFOL POLYPECTOMY     Patient location during evaluation: PACU Anesthesia Type: MAC Level of consciousness: awake and alert Pain management: pain level controlled Vital Signs Assessment: post-procedure vital signs reviewed and stable Respiratory status: spontaneous breathing, nonlabored ventilation and respiratory function stable Cardiovascular status: stable and blood pressure returned to baseline Anesthetic complications: no   No notable events documented.  Last Vitals:  Vitals:   06/21/21 1015 06/21/21 1025  BP: (!) 100/50 (!) 92/55  Pulse: 86 85  Resp: 13 11  Temp:    SpO2: 99% 97%    Last Pain:  Vitals:   06/21/21 1025  TempSrc:   PainSc: 0-No pain                 Audry Pili
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
## 2021-06-21 NOTE — Discharge Instructions (Signed)

## 2021-06-21 NOTE — Anesthesia Procedure Notes (Signed)
Procedure Name: MAC Date/Time: 06/21/2021 9:10 AM Performed by: West Pugh, CRNA Pre-anesthesia Checklist: Patient identified, Emergency Drugs available, Suction available, Patient being monitored and Timeout performed Patient Re-evaluated:Patient Re-evaluated prior to induction Oxygen Delivery Method: Simple face mask Preoxygenation: Pre-oxygenation with 100% oxygen Induction Type: IV induction Placement Confirmation: positive ETCO2 Dental Injury: Teeth and Oropharynx as per pre-operative assessment

## 2021-06-22 ENCOUNTER — Encounter (HOSPITAL_COMMUNITY): Payer: Self-pay | Admitting: Gastroenterology

## 2021-06-22 LAB — SURGICAL PATHOLOGY

## 2021-06-25 DIAGNOSIS — L89314 Pressure ulcer of right buttock, stage 4: Secondary | ICD-10-CM | POA: Diagnosis not present

## 2021-06-25 DIAGNOSIS — L89154 Pressure ulcer of sacral region, stage 4: Secondary | ICD-10-CM | POA: Diagnosis not present

## 2021-06-28 NOTE — Progress Notes (Signed)
AWV reviewed and approved

## 2021-06-29 ENCOUNTER — Encounter (HOSPITAL_BASED_OUTPATIENT_CLINIC_OR_DEPARTMENT_OTHER): Payer: Medicare Other | Admitting: Physician Assistant

## 2021-06-30 ENCOUNTER — Other Ambulatory Visit: Payer: Self-pay | Admitting: Student

## 2021-06-30 DIAGNOSIS — K219 Gastro-esophageal reflux disease without esophagitis: Secondary | ICD-10-CM

## 2021-07-05 DIAGNOSIS — H5202 Hypermetropia, left eye: Secondary | ICD-10-CM | POA: Diagnosis not present

## 2021-07-05 DIAGNOSIS — H524 Presbyopia: Secondary | ICD-10-CM | POA: Diagnosis not present

## 2021-07-05 DIAGNOSIS — H52222 Regular astigmatism, left eye: Secondary | ICD-10-CM | POA: Diagnosis not present

## 2021-07-07 ENCOUNTER — Telehealth: Payer: Self-pay | Admitting: *Deleted

## 2021-07-07 NOTE — Telephone Encounter (Signed)
Spoke with [name] at [facility]. States [facility] has notified them that patient needs to re-qualify for his oxygen use. Will need in-person appt for qualifying O2 sats. Will forward to [place] for assistance scheduling an appt.

## 2021-07-07 NOTE — Telephone Encounter (Signed)
Noted! Thank you

## 2021-07-08 DIAGNOSIS — G9589 Other specified diseases of spinal cord: Secondary | ICD-10-CM | POA: Diagnosis not present

## 2021-07-08 DIAGNOSIS — L8994 Pressure ulcer of unspecified site, stage 4: Secondary | ICD-10-CM | POA: Diagnosis not present

## 2021-07-08 DIAGNOSIS — G8220 Paraplegia, unspecified: Secondary | ICD-10-CM | POA: Diagnosis not present

## 2021-07-13 ENCOUNTER — Other Ambulatory Visit: Payer: Self-pay

## 2021-07-13 ENCOUNTER — Ambulatory Visit (INDEPENDENT_AMBULATORY_CARE_PROVIDER_SITE_OTHER): Payer: Medicare Other | Admitting: Student

## 2021-07-13 ENCOUNTER — Encounter: Payer: Self-pay | Admitting: Student

## 2021-07-13 DIAGNOSIS — R140 Abdominal distension (gaseous): Secondary | ICD-10-CM

## 2021-07-13 DIAGNOSIS — Z1211 Encounter for screening for malignant neoplasm of colon: Secondary | ICD-10-CM | POA: Diagnosis not present

## 2021-07-13 DIAGNOSIS — I10 Essential (primary) hypertension: Secondary | ICD-10-CM

## 2021-07-13 DIAGNOSIS — Z933 Colostomy status: Secondary | ICD-10-CM
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
## 2021-07-13 NOTE — Patient Instructions (Addendum)
Thank you, [name] for allowing [place] to provide your care today. Today we discussed your CPAP, stomach problems, colonoscopy results and home health needs.  Follow-up with GI.  You can call them at [phone] schedule appointment As instructed, make a diary of the food you are eating and associated symptoms after eating.  Take this to your GI appointment.  Please call call your home health agency to get more colostomy bags.  My Chart Access: [email or web address]  Please follow-up as needed  Please make sure to arrive 15 minutes prior to your next appointment. If you arrive late, you may be asked to reschedule.    We look forward to seeing you next time. Please call our clinic at [phone] if you have any questions or concerns. The best time to call is Monday-Friday from 9am-4pm, but there is someone available 24/7. If after hours or the weekend, call the main hospital number and ask for the Internal Medicine Resident On-Call. If you need medication refills, please notify your pharmacy one week in advance and they will send [place] a request.   Thank you for letting us take part in your care. Wishing you the best!  Lacinda Axon, MD 07/13/2021, 10:26 AM IM Resident, PGY-2 [facility] 41:10

## 2021-07-14 ENCOUNTER — Encounter: Payer: Self-pay | Admitting: Student

## 2021-07-14 NOTE — Assessment & Plan Note (Signed)
BP remained stable on current regimen.  BP 133/70 today. -- Continue on Tribenzor 40-10-20 5 mg daily

## 2021-07-14 NOTE — Assessment & Plan Note (Addendum)
Surgical pathology of the cecum showed tubular adenoma but no high grade dysplasia or malignancy -- Repeat colonoscopy in 10 years

## 2021-07-14 NOTE — Assessment & Plan Note (Signed)
Ostomy bag in place with small amounts of brown stool. Stoma remains pink. No tenderness around stoma.  Patient reports that he has been out of ostomy bag and has been rinsing the same bag and reusing it. States that home health agency stop sending nursing aides to come to his house.  -- Assistance was given to patient in contacting home health agency for more supplies. -- Follow-up with GI as needed

## 2021-07-14 NOTE — Telephone Encounter (Signed)
Patient no longer requires oxygen supplementation. O2 sat 99% on RA at yesterday's OV. Using oxygen through CPAP only. Faxed order back to [facility] with this information. Fax confirmation receipt received.

## 2021-07-14 NOTE — Assessment & Plan Note (Signed)
Patient continues to complain of bloating feelings after eating. Patient unsure what type of foods causes the symptoms. Patient recently treated for opioid-induced constipation with laxative however he continues to have abdominal bloating.  Denies abdominal pain, diarrhea, nausea, vomiting or loss of appetite.  Patient's abdomen remains mildly distended but not tender. Bowel sounds hyperactive. Unclear cause of patient's bloating but this could be likely related to his fecal diversion.  Plan: -- Advised to follow-up with GI -- Instructed to make a diary of every food he eats and their associated symptoms take this to his GI appointment. -- Continue on as needed laxative for constipation

## 2021-07-15 NOTE — Progress Notes (Signed)
Internal Medicine Clinic Attending  Case discussed with Dr. A[place]onsah  At the time of the visit.  We reviewed the resident's history and exam and pertinent patient test results.  I agree with the assessment, diagnosis, and plan of care documented in the resident's note.  

## 2021-07-17 DIAGNOSIS — L89314 Pressure ulcer of right buttock, stage 4: Secondary | ICD-10-CM | POA: Diagnosis not present

## 2021-07-17 DIAGNOSIS — N319 Neuromuscular dysfunction of bladder, unspecified: Secondary | ICD-10-CM | POA: Diagnosis not present

## 2021-07-17 DIAGNOSIS — Z933 Colostomy status: Secondary | ICD-10-CM | POA: Diagnosis not present

## 2021-07-17 DIAGNOSIS — L89154 Pressure ulcer of sacral region, stage 4: Secondary | ICD-10-CM | POA: Diagnosis not present

## 2021-07-18 DIAGNOSIS — L89154 Pressure ulcer of sacral region, stage 4: Secondary | ICD-10-CM | POA: Diagnosis not present

## 2021-07-18 DIAGNOSIS — Z933 Colostomy status: Secondary | ICD-10-CM | POA: Diagnosis not present

## 2021-07-18 DIAGNOSIS — N319 Neuromuscular dysfunction of bladder, unspecified: Secondary | ICD-10-CM | POA: Diagnosis not present

## 2021-07-18 DIAGNOSIS — L89314 Pressure ulcer of right buttock, stage 4: Secondary | ICD-10-CM | POA: Diagnosis not present

## 2021-07-20 ENCOUNTER — Other Ambulatory Visit: Payer: Self-pay

## 2021-07-20 ENCOUNTER — Encounter (HOSPITAL_BASED_OUTPATIENT_CLINIC_OR_DEPARTMENT_OTHER): Payer: Medicare Other | Attending: Physician Assistant | Admitting: Physician Assistant

## 2021-07-20 DIAGNOSIS — L89154 Pressure ulcer of sacral region, stage 4: Secondary | ICD-10-CM | POA: Diagnosis not present

## 2021-07-20 DIAGNOSIS — L89892 Pressure ulcer of other site, stage 2: Secondary | ICD-10-CM | POA: Insufficient documentation

## 2021-07-20 DIAGNOSIS — S30813A Abrasion of scrotum and testes, initial encounter: Secondary | ICD-10-CM | POA: Diagnosis not present

## 2021-07-20 DIAGNOSIS — F17218 Nicotine dependence, cigarettes, with other nicotine-induced disorders: Secondary | ICD-10-CM | POA: Insufficient documentation

## 2021-07-20 DIAGNOSIS — L89893 Pressure ulcer of other site, stage 3: Secondary | ICD-10-CM | POA: Insufficient documentation

## 2021-07-20 DIAGNOSIS — L89314 Pressure ulcer of right buttock, stage 4: Secondary | ICD-10-CM | POA: Insufficient documentation

## 2021-07-20 DIAGNOSIS — L89324 Pressure ulcer of left buttock, stage 4: Secondary | ICD-10-CM | POA: Insufficient documentation

## 2021-07-20 DIAGNOSIS — G8221 Paraplegia, complete: Secondary | ICD-10-CM | POA: Diagnosis not present

## 2021-07-20 DIAGNOSIS — X58XXXA Exposure to other specified factors, initial encounter: Secondary | ICD-10-CM | POA: Diagnosis not present

## 2021-07-20 NOTE — Progress Notes (Signed)
[name], [name] ([id number]) Visit Report for 07/20/2021 Arrival Information Details Patient Name: Date of Service: [name] RA [name] [name] 07/20/2021 9:45 A M Medical Record Number: [id number] Patient Account Number: [id number] Date of Birth/Sex: Treating RN: May 30, 1966 (55 y.o. [name] Primary Care [name]: [name] Other Clinician: Referring [name]: Treating [name]/Extender: Lenox Ahr in Treatment: 207 Visit Information History Since Last Visit Added or deleted any medications: No Patient Arrived: Wheel Chair Any new allergies or adverse reactions: No Arrival Time: 10:31 Had a fall or experienced change in No Transfer Assistance: Manual activities of daily living that may affect Patient Identification Verified: Yes risk of falls: Secondary Verification Process Completed: Yes Signs or symptoms of abuse/neglect since last visito No Patient Requires Transmission-Based Precautions: No Hospitalized since last visit: No Patient Has Alerts: No Implantable device outside of the clinic excluding No cellular tissue based products placed in the center since last visit: Has Dressing in Place as Prescribed: Yes Pain Present Now: No Electronic Signature(s) Signed: 07/20/2021 5:50:03 PM By: Lorrin Jackson Entered By: Lorrin Jackson on 07/20/2021 10:31:18 -------------------------------------------------------------------------------- Clinic Level of Care Assessment Details Patient Name: Date of Service: [name] 07/20/2021 9:45 A M Medical Record Number: [id number] Patient Account Number: [id number] Date of Birth/Sex: Treating RN: [birth year] (55 y.o. [name] Primary Care Verlean Allport: [name] Other Clinician: Referring Philo Kurtz: Treating [name]/Extender: Lenox Ahr in Treatment: 207 Clinic Level of Care Assessment Items TOOL 4 Quantity Score X- 1 0 Use when only an EandM is  performed on FOLLOW-UP visit ASSESSMENTS - Nursing Assessment / Reassessment X- 1 10 Reassessment of Co-morbidities (includes updates in patient status) X- 1 5 Reassessment of Adherence to Treatment Plan ASSESSMENTS - Wound and Skin A ssessment / Reassessment []  - 0 Simple Wound Assessment / Reassessment - one wound X- 4 5 Complex Wound Assessment / Reassessment - multiple wounds []  - 0 Dermatologic / Skin Assessment (not related to wound area) ASSESSMENTS - Focused Assessment []  - 0 Circumferential Edema Measurements - multi extremities []  - 0 Nutritional Assessment / Counseling / Intervention []  - 0 Lower Extremity Assessment (monofilament, tuning fork, pulses) []  - 0 Peripheral Arterial Disease Assessment (using hand held doppler) ASSESSMENTS - Ostomy and/or Continence Assessment and Care []  - 0 Incontinence Assessment and Management []  - 0 Ostomy Care Assessment and Management (repouching, etc.) PROCESS - Coordination of Care []  - 0 Simple Patient / Family Education for ongoing care X- 1 20 Complex (extensive) Patient / Family Education for ongoing care []  - 0 Staff obtains Programmer, systems, Records, T Results / Process Orders est []  - 0 Staff telephones HHA, Nursing Homes / Clarify orders / etc []  - 0 Routine Transfer to another Facility (non-emergent condition) []  - 0 Routine Hospital Admission (non-emergent condition) []  - 0 New Admissions / Biomedical engineer / Ordering NPWT Apligraf, etc. , []  - 0 Emergency Hospital Admission (emergent condition) []  - 0 Simple Discharge Coordination []  - 0 Complex (extensive) Discharge Coordination PROCESS - Special Needs []  - 0 Pediatric / Minor Patient Management []  - 0 Isolation Patient Management []  - 0 Hearing / Language / Visual special needs []  - 0 Assessment of Community assistance (transportation, D/C planning, etc.) []  - 0 Additional assistance / Altered mentation []  - 0 Support Surface(s) Assessment  (bed, cushion, seat, etc.) INTERVENTIONS - Wound Cleansing / Measurement []  - 0 Simple Wound Cleansing - one wound X- 4 5 Complex Wound Cleansing - multiple wounds X- 1  5 Wound Imaging (photographs - any number of wounds) []  - 0 Wound Tracing (instead of photographs) []  - 0 Simple Wound Measurement - one wound X- 4 5 Complex Wound Measurement - multiple wounds INTERVENTIONS - Wound Dressings []  - 0 Small Wound Dressing one or multiple wounds X- 4 15 Medium Wound Dressing one or multiple wounds []  - 0 Large Wound Dressing one or multiple wounds []  - 0 Application of Medications - topical []  - 0 Application of Medications - injection INTERVENTIONS - Miscellaneous []  - 0 External ear exam []  - 0 Specimen Collection (cultures, biopsies, blood, body fluids, etc.) []  - 0 Specimen(s) / Culture(s) sent or taken to Lab for analysis []  - 0 Patient Transfer (multiple staff / Civil Service fast streamer / Similar devices) []  - 0 Simple Staple / Suture removal (25 or less) []  - 0 Complex Staple / Suture removal (26 or more) []  - 0 Hypo / Hyperglycemic Management (close monitor of Blood Glucose) []  - 0 Ankle / Brachial Index (ABI) - do not check if billed separately X- 1 5 Vital Signs Has the patient been seen at the hospital within the last three years: Yes Total Score: 165 Level Of Care: New/Established - Level 5 Electronic Signature(s) Signed: 07/20/2021 5:50:03 PM By: Lorrin Jackson Entered By: Lorrin Jackson on 07/20/2021 10:41:44 -------------------------------------------------------------------------------- Encounter Discharge Information Details Patient Name: Date of Service: [name] RA [name] RRIN 07/20/2021 9:45 A M Medical Record Number: [id number] Patient Account Number: [id number] Date of Birth/Sex: Treating RN: [birth year] (55 y.o. [name] Primary Care [name]: [name] Other Clinician: Referring Drinda Belgard: Treating Zareena Willis/Extender: Lenox Ahr in Treatment: 207 Encounter Discharge Information Items Discharge Condition: Stable Ambulatory Status: Wheelchair Discharge Destination: Home Transportation: Private Auto Schedule Follow-up Appointment: Yes Clinical Summary of Care: Provided on 07/20/2021 Form Type Recipient Paper Patient Patient Electronic Signature(s) Signed: 07/20/2021 5:50:03 PM By: Lorrin Jackson Entered By: Lorrin Jackson on 07/20/2021 11:03:37 -------------------------------------------------------------------------------- Lower Extremity Assessment Details Patient Name: Date of Service: [name] Anne [place] RRIN 07/20/2021 9:45 A M Medical Record Number: [id number] Patient Account Number: [id number] Date of Birth/Sex: Treating RN: October 30, 1965 (55 y.o. [name] Primary Care Lafreda Casebeer: [name] Other Clinician: Referring Sloan Galentine: Treating Amberia Bayless/Extender: Lenox Ahr in Treatment: 207 Electronic Signature(s) Signed: 07/20/2021 5:50:03 PM By: Lorrin Jackson Entered By: Lorrin Jackson on 07/20/2021 10:30:57 -------------------------------------------------------------------------------- Multi-Disciplinary Care Plan Details Patient Name: Date of Service: [name] RA [place], [name] 07/20/2021 9:45 A M Medical Record Number: [id number] Patient Account Number: [id number] Date of Birth/Sex: Treating RN: [birth year] (55 y.o. [name] Primary Care Kalyan Barabas: [name] Other Clinician: Referring Revanth Neidig: Treating Maicie Vanderloop/Extender: Lenox Ahr in Treatment: [place] reviewed with physician Active Inactive Pressure Nursing Diagnoses: Knowledge deficit related to causes and risk factors for pressure ulcer development Knowledge deficit related to management of pressures ulcers Potential for impaired tissue integrity related to pressure, friction, moisture, and shear Goals: Patient will remain  free from development of additional pressure ulcers Date Initiated: 12/12/2017 Date Inactivated: 08/14/2018 Target Resolution Date: 08/21/2018 Goal Status: Unmet Unmet Reason: hospitalized Patient will remain free of pressure ulcers Date Initiated: 12/12/2017 Date Inactivated: 02/06/2018 Target Resolution Date: 02/06/2018 Unmet Reason: pt noncompliant wth Goal Status: Unmet offloading Patient/caregiver will verbalize understanding of pressure ulcer management Date Initiated: 12/12/2017 Target Resolution Date: 08/31/2021 Goal Status: Active Interventions: Assess: immobility, friction, shearing, incontinence upon admission and as needed Assess offloading mechanisms upon admission and as needed Assess  potential for pressure ulcer upon admission and as needed Notes: 07/20/21: Pressure/Offloading ongoing issue Wound/Skin Impairment Nursing Diagnoses: Impaired tissue integrity Goals: Patient/caregiver will verbalize understanding of skin care regimen Date Initiated: 03/06/2018 Target Resolution Date: 08/31/2021 Goal Status: Active Ulcer/skin breakdown will heal within 14 weeks Date Initiated: 07/31/2017 Date Inactivated: 01/09/2018 Target Resolution Date: 11/30/2017 Unmet Reason: noncompliant with Goal Status: Unmet offloading Interventions: Assess patient/caregiver ability to perform ulcer/skin care regimen upon admission and as needed Notes: 07/20/21: Wound care education continues, did not have any dressings in place when arrived. Electronic Signature(s) Signed: 07/20/2021 5:50:03 PM By: Lorrin Jackson Entered By: Lorrin Jackson on 07/20/2021 10:32:55 -------------------------------------------------------------------------------- Pain Assessment Details Patient Name: Date of Service: [name] Marygrace Drought, [name] RRIN 07/20/2021 9:45 A M Medical Record Number: [id number] Patient Account Number: [id number] Date of Birth/Sex: Treating RN: December 27, 1965 (55 y.o. [name] Primary Care  Dorissa Stinnette: [name] Other Clinician: Referring Indiya Izquierdo: Treating Arryn Terrones/Extender: Lenox Ahr in Treatment: 207 Active Problems Location of Pain Severity and Description of Pain Patient Has Paino No Site Locations Pain Management and Medication Current Pain Management: Electronic Signature(s) Signed: 07/20/2021 5:50:03 PM By: Lorrin Jackson Entered By: Lorrin Jackson on 07/20/2021 10:30:44 -------------------------------------------------------------------------------- Patient/Caregiver Education Details Patient Name: Date of Service: [name] RA [name], [name] RRIN 10/19/2022andnbsp9:45 A M Medical Record Number: [id number] Patient Account Number: [id number] Date of Birth/Gender: Treating RN: Feb 24, 1966 (55 y.o. [name] Primary Care Physician: [name] Other Clinician: Referring Physician: Treating Physician/Extender: Lenox Ahr in Treatment: 207 Education Assessment Education Provided To: Patient Education Topics Provided Pressure: Methods: Explain/Verbal, Printed Responses: State content correctly Wound/Skin Impairment: Methods: Explain/Verbal, Printed Responses: State content correctly Electronic Signature(s) Signed: 07/20/2021 5:50:03 PM By: Lorrin Jackson Entered By: Lorrin Jackson on 07/20/2021 10:33:18 -------------------------------------------------------------------------------- Wound Assessment Details Patient Name: Date of Service: [name] RA [name], [place] 07/20/2021 9:45 A M Medical Record Number: [id number] Patient Account Number: [id number] Date of Birth/Sex: Treating RN: May 25, 1966 (55 y.o. [name] Primary Care Jennamarie Goings: [name] Other Clinician: Referring Arrington Bencomo: Treating Dorean Daniello/Extender: Lenox Ahr in Treatment: 207 Wound Status Wound Number: 19 Primary Etiology: Pressure Ulcer Wound Location: Sacrum Wound Status:  Open Wounding Event: Pressure Injury Comorbid History: Anemia, Hypertension, Colitis, Osteomyelitis, Paraplegia Date Acquired: 10/02/2013 Weeks Of Treatment: 207 Clustered Wound: No Photos Wound Measurements Length: (cm) 1.8 % Red Width: (cm) 1.8 % Red Depth: (cm) 0.7 Epith Area: (cm) 2.545 Tunn Volume: (cm) 1.781 P Ma uction in Area: -19.1% uction in Volume: 35.9% elialization: Medium (34-66%) eling: Yes osition (o'clock): 2 ximum Distance: (cm) 0.7 Undermining: No Wound Description Classification: Category/Stage IV Foul Wound Margin: Fibrotic scar, thickened scar Slou Exudate Amount: Medium Exudate Type: Serosanguineous Exudate Color: red, brown Odor After Cleansing: No gh/Fibrino No Wound Bed Granulation Amount: Large (67-100%) Exposed Structure Granulation Quality: Pink, Pale Fascia Exposed: No Necrotic Amount: None Present (0%) Fat Layer (Subcutaneous Tissue) Exposed: Yes Tendon Exposed: No Muscle Exposed: No Joint Exposed: No Bone Exposed: No Treatment Notes Wound #19 (Sacrum) Cleanser Peri-Wound Care Topical Primary Dressing KerraCel Ag Gelling Fiber Dressing, 4x5 in (silver alginate) Discharge Instruction: Apply silver alginate to wound bed, do not pack, just tuck lightly into wounds as instructed Secondary Dressing ABD Pad, 8x10 Discharge Instruction: Apply over primary dressing as directed. Secured With 20M Medipore H Soft Cloth Surgical T 4 x 2 (in/yd) ape Discharge Instruction: Secure dressing with tape as directed. Compression Wrap Compression Stockings Add-Ons Electronic Signature(s) Signed: 07/20/2021 5:50:03 PM By: Onnie Boer,  Lenna Sciara By: Lorrin Jackson on 07/20/2021 10:27:19 -------------------------------------------------------------------------------- Wound Assessment Details Patient Name: Date of Service: [name] RA [name] 07/20/2021 9:45 A M Medical Record Number: [id number] Patient Account Number: [id number] Date of  Birth/Sex: Treating RN: 13-Aug-1966 (55 y.o. [name] Primary Care Kameka Whan: [name] Other Clinician: Referring Sherron Mapp: Treating Caro Brundidge/Extender: Lenox Ahr in Treatment: 207 Wound Status Wound Number: 21 Primary Etiology: Pressure Ulcer Wound Location: Right, Medial Ischium Wound Status: Open Wounding Event: Gradually Appeared Comorbid History: Anemia, Hypertension, Colitis, Osteomyelitis, Paraplegia Date Acquired: 10/02/2012 Weeks Of Treatment: 172 Clustered Wound: No Photos Wound Measurements Length: (cm) 2.5 Width: (cm) 3 Depth: (cm) 1 Area: (cm) 5.89 Volume: (cm) 5.89 % Reduction in Area: -108.3% % Reduction in Volume: -4.2% Epithelialization: Large (67-100%) Tunneling: No Undermining: Yes Starting Position (o'clock): 5 Ending Position (o'clock): 7 Maximum Distance: (cm) 0.8 Wound Description Classification: Category/Stage II Wound Margin: Well defined, not attached Exudate Amount: Small Exudate Type: Serosanguineous Exudate Color: red, brown Foul Odor After Cleansing: No Slough/Fibrino No Wound Bed Granulation Amount: Large (67-100%) Exposed Structure Granulation Quality: Pink, Friable Fascia Exposed: No Necrotic Amount: None Present (0%) Fat Layer (Subcutaneous Tissue) Exposed: Yes Tendon Exposed: No Muscle Exposed: No Joint Exposed: No Bone Exposed: No Treatment Notes Wound #21 (Ischium) Wound Laterality: Right, Medial Cleanser Peri-Wound Care Topical Primary Dressing KerraCel Ag Gelling Fiber Dressing, 4x5 in (silver alginate) Discharge Instruction: Apply silver alginate to wound bed, do not pack, just tuck lightly into wounds as instructed Secondary Dressing ABD Pad, 8x10 Discharge Instruction: Apply over primary dressing as directed. Secured With 104M Medipore H Soft Cloth Surgical T 4 x 2 (in/yd) ape Discharge Instruction: Secure dressing with tape as directed. Compression Wrap Compression  Stockings Add-Ons Electronic Signature(s) Signed: 07/20/2021 5:50:03 PM By: Lorrin Jackson Entered By: Lorrin Jackson on 07/20/2021 10:28:16 -------------------------------------------------------------------------------- Wound Assessment Details Patient Name: Date of Service: [name] RA [place], [name] RRIN 07/20/2021 9:45 A M Medical Record Number: [id number] Patient Account Number: [id number] Date of Birth/Sex: Treating RN: Nov 02, 1965 (55 y.o. [name] Primary Care Erryn Dickison: [name] Other Clinician: Referring Melissa Tomaselli: Treating Loy Mccartt/Extender: Lenox Ahr in Treatment: 207 Wound Status Wound Number: 22 Primary Etiology: Pressure Ulcer Wound Location: Left Ischium Wound Status: Open Wounding Event: Gradually Appeared Comorbid History: Anemia, Hypertension, Colitis, Osteomyelitis, Paraplegia Date Acquired: 10/02/2013 Weeks Of Treatment: 172 Clustered Wound: No Photos Wound Measurements Length: (cm) 2.5 Width: (cm) 1 Depth: (cm) 2 Area: (cm) 1.963 Volume: (cm) 3.927 % Reduction in Area: -363% % Reduction in Volume: -230.6% Epithelialization: Medium (34-66%) Tunneling: No Undermining: No Wound Description Classification: Category/Stage II Wound Margin: Epibole Exudate Amount: Medium Exudate Type: Serosanguineous Exudate Color: red, brown Foul Odor After Cleansing: No Slough/Fibrino No Wound Bed Granulation Amount: Large (67-100%) Exposed Structure Granulation Quality: Pink, Pale Fascia Exposed: No Necrotic Amount: None Present (0%) Fat Layer (Subcutaneous Tissue) Exposed: Yes Tendon Exposed: No Muscle Exposed: No Joint Exposed: No Bone Exposed: No Treatment Notes Wound #22 (Ischium) Wound Laterality: Left Cleanser Peri-Wound Care Topical Primary Dressing KerraCel Ag Gelling Fiber Dressing, 4x5 in (silver alginate) Discharge Instruction: Apply silver alginate to wound bed, do not pack, just tuck lightly into wounds  as instructed Secondary Dressing ABD Pad, 8x10 Discharge Instruction: Apply over primary dressing as directed. Secured With 104M Medipore H Soft Cloth Surgical T 4 x 2 (in/yd) ape Discharge Instruction: Secure dressing with tape as directed. Compression Wrap Compression Stockings Add-Ons Electronic Signature(s) Signed: 07/20/2021 5:50:03 PM By: Fara Chute  By: Lorrin Jackson on 07/20/2021 10:28:59 -------------------------------------------------------------------------------- Wound Assessment Details Patient Name: Date of Service: Wende Neighbors 07/20/2021 9:45 A M Medical Record Number: [id number] Patient Account Number: [id number] Date of Birth/Sex: Treating RN: 07/30/1966 (55 y.o. [name] Primary Care Bassel Gaskill: [name] Other Clinician: Referring Carrington Olazabal: Treating Kalyiah Saintil/Extender: Lenox Ahr in Treatment: 207 Wound Status Wound Number: 27 Primary Etiology: Pressure Ulcer Wound Location: Scrotum Wound Status: Open Wounding Event: Pressure Injury Comorbid History: Anemia, Hypertension, Colitis, Osteomyelitis, Paraplegia Date Acquired: 02/23/2021 Weeks Of Treatment: 21 Clustered Wound: Yes Photos Wound Measurements Length: (cm) 3 Width: (cm) 2 Depth: (cm) 0.1 Clustered Quantity: 1 Area: (cm) 4.712 Volume: (cm) 0.471 % Reduction in Area: 92.5% % Reduction in Volume: 92.5% Epithelialization: Medium (34-66%) Tunneling: No Undermining: No Wound Description Classification: Category/Stage III Wound Margin: Distinct, outline attached Exudate Amount: Medium Exudate Type: Serosanguineous Exudate Color: red, brown Foul Odor After Cleansing: No Slough/Fibrino No Wound Bed Granulation Amount: Large (67-100%) Exposed Structure Granulation Quality: Red, Pink, Friable Fascia Exposed: No Necrotic Amount: None Present (0%) Fat Layer (Subcutaneous Tissue) Exposed: Yes Tendon Exposed: No Muscle Exposed:  No Joint Exposed: No Bone Exposed: No Treatment Notes Wound #27 (Scrotum) Cleanser Peri-Wound Care Zinc Oxide Ointment 30g tube Discharge Instruction: Apply Zinc Oxide to periwound with each dressing change Topical Primary Dressing KerraCel Ag Gelling Fiber Dressing, 4x5 in (silver alginate) Discharge Instruction: Apply silver alginate to wound bed as instructed Secondary Dressing ABD Pad, 8x10 Discharge Instruction: Apply over primary dressing, may change daily to keep dry Secured With Compression Wrap Compression Stockings Add-Ons Electronic Signature(s) Signed: 07/20/2021 5:50:03 PM By: Lorrin Jackson Entered By: Lorrin Jackson on 07/20/2021 10:29:46 -------------------------------------------------------------------------------- Vitals Details Patient Name: Date of Service: [name] RA [name], [name] RRIN 07/20/2021 9:45 A M Medical Record Number: [id number] Patient Account Number: [id number] Date of Birth/Sex: Treating RN: 02-28-1966 (55 y.o. [name] Primary Care Jonnatan Hanners: [name] Other Clinician: Referring Maxcine Strong: Treating Jeri Jeanbaptiste/Extender: Lenox Ahr in Treatment: 207 Vital Signs Time Taken: 10:09 Temperature (F): 98 Height (in): 72 Pulse (bpm): 83 Weight (lbs): 215 Respiratory Rate (breaths/min): 18 Body Mass Index (BMI): 29.2 Blood Pressure (mmHg): 125/80 Reference Range: 80 - 120 mg / dl Electronic Signature(s) Signed: 07/20/2021 5:50:03 PM By: Lorrin Jackson Entered By: Lorrin Jackson on 07/20/2021 10:10:17

## 2021-07-21 NOTE — Progress Notes (Addendum)
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 vomiting, or diarrhea. 04/21/2020 upon evaluation today patient appears to be doing really about the same in regard to his wounds. There is no signs of dramatic improvement unfortunately he still is not really talking  in the dressings just laying them on top. Again I think this is going to be a very slow process if he has a chance of healing this at all to be honest. With that being said there is no evidence of active severe infection at this point which is good news. 05/26/2020 on evaluation today patient appears to be doing okay with regard to his wounds. There is no signs of active infection at this time which is great news. Overall there is really  no significant change for better or worse. 08/04/2020 upon evaluation today patient actually appears to be doing quite well with regard to his wounds. I see nothing that appears to be worse at this time. Has been tolerating the dressing changes without complication. Fortunately there is no signs of active infection at this time. No fevers, chills, nausea, vomiting, or diarrhea. 09/15/2020 on evaluation today patient appears to be doing well with regard to his wounds all things considered they are doing about the same. There is no signs of active infection at this time. No fevers, chills, nausea, vomiting, or diarrhea. 11/03/2020 upon evaluation today patient appears to be doing unfortunately not too great. He has gained quite a bit of weight and to be honest this coupled with having issues with what he calls "tendinitis" in his elbows, knees, wrist, has caused issues with him being able to transfer he can no longer transfer himself. I do believe that this likely is also at least in part or if not in full related to significant weight gain even since I saw him last on December 15. I am not sure how much she has gained but he has gained quite a bit. He does have a bed that we did get replaced for him this is an air mattress which is alternating. With that being said unfortunately even with this I think he is mainly staying in bed all the time now he does not get up in his wheelchair much at all. Nonetheless I think that this is leading to even more issues from that  standpoint as he is not moving as much as he was in the past. All of this is leading to him having some pretty significant issues at this point all of which I think are completely real but all of which I think may be at least propagated in part by his weight gain. He has not talked to his primary care provider yet about any of this. 12/29/2020 on evaluation today patient appears to be doing well with regard to his wounds all things considered. In fact on the right side gluteal region I really cannot find anything open at this point which is great news. Fortunately I think that he is not showing any signs of significant infection which is also great news. No fevers, chills, nausea, vomiting, or diarrhea. 5/25; patient presents for 99-month follow-up. He reports he is using silver alginate to the wounds. He has no complaints or issues today except for that he has a new wound to his scrotum. He denies signs of infection today. 03/23/2021 upon evaluation today patient appears to be doing well with regard to his wounds in general. Fortunately there is no signs of active infection at this time which is great news. No fevers, chills, nausea, vomiting, or diarrhea. 05/09/2021 upon evaluation today patient appears to be doing about the same really in regard to his wound. Really nothing much is changed in general things are in some ways a [name] smaller but again overall I do not think there is a significant improvement compared to where we were previous. We cannot really pack the wounds and in fact if we do he gets very upset stating that we cause discomfort and pain. 07/20/2021 upon evaluation today patient appears to be doing okay in regard to his wounds although some of the area around the scrotum actually appears to be little bit more broken down than what has been previous. Fortunately there does not appear to be any signs  of active infection at this time which is great news. No fevers, chills, nausea, vomiting,  or diarrhea. Objective Constitutional Well-nourished and well-hydrated in no acute distress. Vitals Time Taken: 10:09 AM, Height: 72 in, Weight: 215 lbs, BMI: 29.2, Temperature: 98 F, Pulse: 83 bpm, Respiratory Rate: 18 breaths/min, Blood Pressure: 125/80 mmHg. Respiratory normal breathing without difficulty. Psychiatric this patient is able to make decisions and demonstrates good insight into disease process. Alert and Oriented x 3. pleasant and cooperative. General Notes: Upon inspection patient's wound bed showed signs of good granulation and epithelization at this point. Fortunately there is no evidence of infection systemically nor locally although I do think there is a little bit more breakdown around the scrotal region I think he needs to be careful about keeping this well and offloaded I think that is the biggest issue here. Integumentary (Hair, Skin) Wound #19 status is Open. Original cause of wound was Pressure Injury. The date acquired was: 10/02/2013. The wound has been in treatment 207 weeks. The wound is located on the Sacrum. The wound measures 1.8cm length x 1.8cm width x 0.7cm depth; 2.545cm^2 area and 1.781cm^3 volume. There is Fat Layer (Subcutaneous Tissue) exposed. There is no undermining noted, however, there is tunneling at 2:00 with a maximum distance of 0.7cm. There is a medium amount of serosanguineous drainage noted. The wound margin is fibrotic, thickened scar. There is large (67-100%) pink, pale granulation within the wound bed. There is no necrotic tissue within the wound bed. Wound #21 status is Open. Original cause of wound was Gradually Appeared. The date acquired was: 10/02/2012. The wound has been in treatment 172 weeks. The wound is located on the Right,Medial Ischium. The wound measures 2.5cm length x 3cm width x 1cm depth; 5.89cm^2 area and 5.89cm^3 volume. There is Fat Layer (Subcutaneous Tissue) exposed. There is no tunneling noted, however, there is  undermining starting at 5:00 and ending at 7:00 with a maximum distance of 0.8cm. There is a small amount of serosanguineous drainage noted. The wound margin is well defined and not attached to the wound base. There is large (67-100%) pink, friable granulation within the wound bed. There is no necrotic tissue within the wound bed. Wound #22 status is Open. Original cause of wound was Gradually Appeared. The date acquired was: 10/02/2013. The wound has been in treatment 172 weeks. The wound is located on the Left Ischium. The wound measures 2.5cm length x 1cm width x 2cm depth; 1.963cm^2 area and 3.927cm^3 volume. There is Fat Layer (Subcutaneous Tissue) exposed. There is no tunneling or undermining noted. There is a medium amount of serosanguineous drainage noted. The wound margin is epibole. There is large (67-100%) pink, pale granulation within the wound bed. There is no necrotic tissue within the wound bed. Wound #27 status is Open. Original cause of wound was Pressure Injury. The date acquired was: 02/23/2021. The wound has been in treatment 21 weeks. The wound is located on the Scrotum. The wound measures 3cm length x 2cm width x 0.1cm depth; 4.712cm^2 area and 0.471cm^3 volume. There is Fat Layer (Subcutaneous Tissue) exposed. There is no tunneling or undermining noted. There is a medium amount of serosanguineous drainage noted. The wound margin is distinct with the outline attached to the wound base. There is large (67-100%) red, pink, friable granulation within the wound bed. There is no necrotic tissue within the wound bed. Assessment Active Problems ICD-10 Pressure ulcer of left buttock, stage 4 Pressure ulcer of right buttock, stage 4 Pressure ulcer of  sacral region, stage 4 Pressure ulcer of other site, stage 2 Pressure ulcer of other site, stage 3 Paraplegia, incomplete Nicotine dependence, cigarettes, with other nicotine-induced disorders Abrasion of scrotum and testes, initial  encounter Plan Follow-up Appointments: Return appointment in 1 month. - with [name] Shower/ Hygiene: May shower and wash wound with soap and water. Off-Loading: Low air-loss mattress (Group 2) Turn and reposition every 2 hours Other: - place folded towel under scrotum to elevate WOUND #19: - Sacrum Wound Laterality: Prim Dressing: KerraCel Ag Gelling Fiber Dressing, 4x5 in (silver alginate) (Dispense As Written) 1 x Per Day/30 Days ary Discharge Instructions: Apply silver alginate to wound bed, do not pack, just tuck lightly into wounds as instructed Secondary Dressing: ABD Pad, 8x10 (Generic) 1 x Per Day/30 Days Discharge Instructions: Apply over primary dressing as directed. Secured With: 372M Medipore H Soft Cloth Surgical T 4 x 2 (in/yd) (Generic) 1 x Per Day/30 Days ape Discharge Instructions: Secure dressing with tape as directed. WOUND #21: - Ischium Wound Laterality: Right, Medial Prim Dressing: KerraCel Ag Gelling Fiber Dressing, 4x5 in (silver alginate) (Dispense As Written) 1 x Per Day/30 Days ary Discharge Instructions: Apply silver alginate to wound bed, do not pack, just tuck lightly into wounds as instructed Secondary Dressing: ABD Pad, 8x10 (Generic) 1 x Per Day/30 Days Discharge Instructions: Apply over primary dressing as directed. Secured With: 372M Medipore H Soft Cloth Surgical T 4 x 2 (in/yd) (Generic) 1 x Per Day/30 Days ape Discharge Instructions: Secure dressing with tape as directed. WOUND #22: - Ischium Wound Laterality: Left Prim Dressing: KerraCel Ag Gelling Fiber Dressing, 4x5 in (silver alginate) (Dispense As Written) 1 x Per Day/30 Days ary Discharge Instructions: Apply silver alginate to wound bed, do not pack, just tuck lightly into wounds as instructed Secondary Dressing: ABD Pad, 8x10 (Generic) 1 x Per Day/30 Days Discharge Instructions: Apply over primary dressing as directed. Secured With: 372M Medipore H Soft Cloth Surgical T 4 x 2 (in/yd)  (Generic) 1 x Per Day/30 Days ape Discharge Instructions: Secure dressing with tape as directed. WOUND #27: - Scrotum Wound Laterality: Peri-Wound Care: Zinc Oxide Ointment 30g tube Every Other Day/30 Days Discharge Instructions: Apply Zinc Oxide to periwound with each dressing change Prim Dressing: KerraCel Ag Gelling Fiber Dressing, 4x5 in (silver alginate) (Dispense As Written) Every Other Day/30 Days ary Discharge Instructions: Apply silver alginate to wound bed as instructed Secondary Dressing: ABD Pad, 8x10 (Generic) Every Other Day/30 Days Discharge Instructions: Apply over primary dressing, may change daily to keep dry 1. Would recommend that we going continue with the silver alginate unfortunately he does not allow [place] to pack the skin but we will continue to use it over top I think that does well he does have a friend who helps him with dressing changes at home. 2. Also can recommend that we have the patient continue to monitor for any signs of worsening or infection obviously if he has any major issues he should let me know soon as possible. 3. Again we have attempted in the past to do additional imaging and so on we know in the past he has had osteomyelitis the good news is things seem to be calm down quite a bit would not see any major issues here and to be honest being that we Packed the wounds were, limited in what we can do as far as trying to get this healed. We will basically supported him as much as we can as far as keeping  the wounds from becoming severely infected. Patient understands this and that were mainly in a palliative regimen at this point. We will see patient back for reevaluation in 1 Month here in the clinic. If anything worsens or changes patient will contact our office for additional recommendations. Electronic Signature(s) Signed: 07/20/2021 10:45:20 AM By: [name] Keeler PA-C Entered By: [name] Keeler on 07/20/2021  10:45:19 -------------------------------------------------------------------------------- SuperBill Details Patient Name: Date of Service: [place] RA [place], [name] 07/20/2021 Medical Record Number: [id number] Patient Account Number: [id number] Date of Birth/Sex: Treating RN: [birth year] (55 y.o. [name] Primary Care Provider: [name] Other Clinician: Referring Provider: Treating Provider/Extender: Lenox Ahr in Treatment: 207 Diagnosis Coding ICD-10 Codes Code Description [phone] Pressure ulcer of left buttock, stage 4 L89.314 Pressure ulcer of right buttock, stage 4 L89.154 Pressure ulcer of sacral region, stage 4 L89.892 Pressure ulcer of other site, stage 2 L89.893 Pressure ulcer of other site, stage 3 G82.22 Paraplegia, incomplete F17.218 Nicotine dependence, cigarettes, with other nicotine-induced disorders S30.813A Abrasion of scrotum and testes, initial encounter Facility Procedures CPT4 Code: [id number] Description: 09735 - WOUND CARE VISIT-LEV 5 EST PT Modifier: Quantity: 1 Physician Procedures : CPT4 Code Description Modifier [id number] - WC PHYS LEVEL 4 - EST PT ICD-10 Diagnosis Description L89.324 Pressure ulcer of left buttock, stage 4 L89.314 Pressure ulcer of right buttock, stage 4 L89.154 Pressure ulcer of sacral region, stage 4  L89.892 Pressure ulcer of other site, stage 2 Quantity: 1 Electronic Signature(s) Signed: 07/20/2021 10:45:34 AM By: [name] Keeler PA-C Entered By: [name] Keeler on 07/20/2021 10:45:33

## 2021-07-25 DIAGNOSIS — L89154 Pressure ulcer of sacral region, stage 4: Secondary | ICD-10-CM | POA: Diagnosis not present

## 2021-07-25 DIAGNOSIS — L89314 Pressure ulcer of right buttock, stage 4: Secondary | ICD-10-CM | POA: Diagnosis not present

## 2021-08-11 ENCOUNTER — Encounter: Payer: Self-pay | Admitting: *Deleted

## 2021-08-11 ENCOUNTER — Encounter: Payer: Self-pay | Admitting: Student

## 2021-08-11 NOTE — Progress Notes (Unsigned)
Things That May Be Affecting Your Health:  Alcohol  Hearing loss X Pain    Depression  Home Safety  Sexual Health   Diabetes  Lack of physical activity  Stress   Difficulty with daily activities  Loneliness  Tiredness  X Drug use  Medicines  Tobacco use   Falls  Motor Vehicle Safety  Weight   Food choices  Oral Health  Other    YOUR PERSONALIZED HEALTH PLAN : 1. Schedule your next subsequent Medicare Wellness visit in one year 2. Attend all of your regular appointments to address your medical issues 3. Complete the preventative screenings and services   Annual Wellness Visit   Medicare Covered Preventative Screenings and [place] Men and Women Who How Often Need? Date of Last Service Action  Abdominal Aortic Aneurysm Adults with AAA risk factors Once      Alcohol Misuse and Counseling All Adults Screening once a year if no alcohol misuse. Counseling up to 4 face to face sessions.     Bone Density Measurement  Adults at risk for osteoporosis Once every 2 yrs      Lipid Panel Z13.6 All adults without CV disease Once every 5 yrs X      Colorectal Cancer  Stool sample or Colonoscopy All adults 19 and older  Once every year Every 10 years        Depression All Adults Once a year  Today   Diabetes Screening Blood glucose, post glucose load, or GTT Z13.1 All adults at risk Pre-diabetics Once per year Twice per year      Diabetes  Self-Management Training All adults Diabetics 10 hrs first year; 2 hours subsequent years. Requires Copay     Glaucoma Diabetics Family history of glaucoma African Americans 41 yrs + Hispanic Americans 69 yrs + Annually - requires coppay      Hepatitis C Z72.89 or F19.20 High Risk for HCV Born between 1947 and Oct 06, 1965 Annually Once      HIV Z11.4 All adults based on risk Annually btw ages 42 & 25 regardless of risk Annually > 65 yrs if at increased risk      Lung Cancer Screening Asymptomatic adults aged 47-77 with 30  pack yr history and current smoker OR quit within the last 15 yrs Annually Must have counseling and shared decision making documentation before first screen      Medical Nutrition Therapy Adults with  Diabetes Renal disease Kidney transplant within past 3 yrs 3 hours first year; 2 hours subsequent years     Obesity and Counseling All adults Screening once a year Counseling if BMI 30 or higher X Today   Tobacco Use Counseling Adults who use tobacco  Up to 8 visits in one year     Vaccines Z23 Hepatitis B Influenza  Pneumonia  Adults  Once Once every flu season Two different vaccines separated by one year X    Next Annual Wellness Visit People with Medicare Every year  Today     Services & Screenings Women Who How Often Need  Date of Last Service Action  Mammogram  Z12.31 Women over 56 One baseline ages 67-39. Annually ager 40 yrs+      Pap tests All women Annually if high risk. Every 2 yrs for normal risk women      Screening for cervical cancer with  Pap (Z01.419 nl or Z01.411abnl) & HPV Z11.51 Women aged 25 to 77 Once every 5 yrs  Screening pelvic and breast exams All women Annually if high risk. Every 2 yrs for normal risk women     Sexually Transmitted Diseases Chlamydia Gonorrhea Syphilis All at risk adults Annually for non pregnant females at increased risk         [place] Men Who How Ofter Need  Date of Last Service Action  Prostate Cancer - DRE & PSA Men over 50 Annually.  DRE might require a copay.        Sexually Transmitted Diseases Syphilis All at risk adults Annually for men at increased risk      Health Maintenance List Health Maintenance  Topic Date Due   TETANUS/TDAP  Never done   Zoster Vaccines- Shingrix (1 of 2) Never done   Pneumococcal Vaccine 10-38 Years old (2 - PCV) 02/15/2018   COVID-19 Vaccine (4 - Booster for Pfizer series) 11/30/2020   COLONOSCOPY (Pts 45-84yrs Insurance coverage will need to be confirmed)   06/22/2031   INFLUENZA VACCINE  Completed   Hepatitis C Screening  Completed   HIV Screening  Completed   HPV VACCINES  Aged Out

## 2021-08-11 NOTE — Progress Notes (Signed)

## 2021-08-13 ENCOUNTER — Other Ambulatory Visit: Payer: Self-pay

## 2021-08-13 ENCOUNTER — Emergency Department (HOSPITAL_COMMUNITY)
Admission: EM | Admit: 2021-08-13 | Discharge: 2021-08-14 | Disposition: A | Discharge status: Home / Self Care | Payer: Medicare Other | Attending: Emergency Medicine | Admitting: Emergency Medicine

## 2021-08-13 DIAGNOSIS — I10 Essential (primary) hypertension: Secondary | ICD-10-CM | POA: Insufficient documentation

## 2021-08-13 DIAGNOSIS — T839XXA Unspecified complication of genitourinary prosthetic device, implant and graft, initial encounter: Secondary | ICD-10-CM

## 2021-08-13 DIAGNOSIS — Z87891 Personal history of nicotine dependence: Secondary | ICD-10-CM | POA: Diagnosis not present

## 2021-08-13 DIAGNOSIS — N3000 Acute cystitis without hematuria: Secondary | ICD-10-CM | POA: Insufficient documentation

## 2021-08-13 DIAGNOSIS — Y846 Urinary catheterization as the cause of abnormal reaction of the patient, or of later complication, without mention of misadventure at the time of the procedure: Secondary | ICD-10-CM | POA: Diagnosis not present

## 2021-08-13 DIAGNOSIS — Z7982 Long term (current) use of aspirin: Secondary | ICD-10-CM | POA: Insufficient documentation

## 2021-08-13 DIAGNOSIS — T83091A Other mechanical complication of indwelling urethral catheter, initial encounter: Secondary | ICD-10-CM | POA: Insufficient documentation

## 2021-08-13 DIAGNOSIS — Z79899 Other long term (current) drug therapy: Secondary | ICD-10-CM | POA: Diagnosis not present

## 2021-08-13 LAB — CBC WITH DIFFERENTIAL/PLATELET
Abs Immature Granulocytes: 0.04 10*3/uL (ref 0.00–0.07)
Basophils Absolute: 0.1 10*3/uL (ref 0.0–0.1)
Basophils Relative: 0 %
Eosinophils Absolute: 0 10*3/uL (ref 0.0–0.5)
Eosinophils Relative: 0 %
HCT: 32.8 % — ABNORMAL LOW (ref 39.0–52.0)
Hemoglobin: 10.1 g/dL — ABNORMAL LOW (ref 13.0–17.0)
Immature Granulocytes: 0 %
Lymphocytes Relative: 11 %
Lymphs Abs: 1.7 10*3/uL (ref 0.7–4.0)
MCH: 27.2 pg (ref 26.0–34.0)
MCHC: 30.8 g/dL (ref 30.0–36.0)
MCV: 88.4 fL (ref 80.0–100.0)
Monocytes Absolute: 1 10*3/uL (ref 0.1–1.0)
Monocytes Relative: 6 %
Neutro Abs: 12.6 10*3/uL — ABNORMAL HIGH (ref 1.7–7.7)
Neutrophils Relative %: 83 %
Platelets: 255 10*3/uL (ref 150–400)
RBC: 3.71 MIL/uL — ABNORMAL LOW (ref 4.22–5.81)
RDW: 15.2 % (ref 11.5–15.5)
WBC: 15.4 10*3/uL — ABNORMAL HIGH (ref 4.0–10.5)
nRBC: 0 % (ref 0.0–0.2)

## 2021-08-13 LAB — COMPREHENSIVE METABOLIC PANEL
ALT: 25 U/L (ref 0–44)
AST: 26 U/L (ref 15–41)
Albumin: 4.1 g/dL (ref 3.5–5.0)
Alkaline Phosphatase: 118 U/L (ref 38–126)
Anion gap: 8 (ref 5–15)
BUN: 36 mg/dL — ABNORMAL HIGH (ref 6–20)
CO2: 24 mmol/L (ref 22–32)
Calcium: 9.1 mg/dL (ref 8.9–10.3)
Chloride: 103 mmol/L (ref 98–111)
Creatinine, Ser: 2.06 mg/dL — ABNORMAL HIGH (ref 0.61–1.24)
GFR, Estimated: 37 mL/min — ABNORMAL LOW (ref >60)
Glucose, Bld: 94 mg/dL (ref 70–99)
Potassium: 5.6 mmol/L — ABNORMAL HIGH (ref 3.5–5.1)
Sodium: 135 mmol/L (ref 135–145)
Total Bilirubin: 0.5 mg/dL (ref 0.3–1.2)
Total Protein: 9.2 g/dL — ABNORMAL HIGH (ref 6.5–8.1)

## 2021-08-13 LAB — URINALYSIS, ROUTINE W REFLEX MICROSCOPIC
Bilirubin Urine: NEGATIVE
Glucose, UA: NEGATIVE mg/dL
Ketones, ur: NEGATIVE mg/dL
Nitrite: NEGATIVE
Protein, ur: 30 mg/dL — AB
Specific Gravity, Urine: 1.008 (ref 1.005–1.030)
WBC, UA: >50 WBC/hpf — ABNORMAL HIGH (ref 0–5)
pH: 7 (ref 5.0–8.0)

## 2021-08-13 MED ORDER — CEFDINIR 300 MG PO CAPS
300.0000 mg | ORAL_CAPSULE | Freq: Two times a day (BID) | ORAL | Status: DC
  Administered 2021-08-13: 300 mg via ORAL
  Filled 2021-08-13 (×2): qty 1

## 2021-08-13 MED ORDER — CEFDINIR 300 MG PO CAPS: 300.0000 mg | ORAL_CAPSULE | Freq: Two times a day (BID) | ORAL | 0 refills | Status: AC

## 2021-08-13 NOTE — ED Notes (Signed)
Bladder scan volume: 161 mL.

## 2021-08-13 NOTE — Discharge Instructions (Signed)
Your history, exam, work-up today are consistent with a urinary tract infection and Foley problem.  The Foley catheter was replaced relieving your symptoms.  The urine does show evidence of infection.  I spoke with pharmacy who recommended cefdinir for best coverage given your previous cultures.  Please take the antibiotics for the next 10 days and follow-up with your primary team.  If any symptoms change or worsen acutely, please return to the nearest emergency department.
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
## 2021-08-13 NOTE — ED Notes (Signed)
Pt was transitioned from EMS stretcher to ED stretcher by other staff members. Pt was pleased with this transition. Triage resumed by this RN

## 2021-08-13 NOTE — ED Notes (Signed)
[facility] contacted for transport 

## 2021-08-13 NOTE — ED Triage Notes (Signed)
Pt arrives via ems from home. Pt is here due to concerns of possible UTI. Pt had a foley catheter placed on the 8th. Reports he also has a pressure injury on his coccyx. To accommodate this during triage, patient offered to be placed in a recliner with a pillow. Pt declined that offer and states that if he has to sit in the recliner he is holding the hospital responsible for possible worsening of his wound. At this time patient is not getting off the ems stretcher so this RN can complete a full triage.

## 2021-08-13 NOTE — ED Provider Notes (Signed)
Emergency Medicine Provider Triage Evaluation Note  [name] , a 55 y.o. male  was evaluated in triage.  Pt complains of dysuria. He has had an indwelling catheter since February 2021, which was initially left for 7 months without being changed, with subsequent monthly changes by urology since. His catheter was last changed on 11/8, however he states that it was significantly painful which was unusual for him and he has been experiencing significant dysuria since placement. Additionally, he states that he has had to readjust the catheter to get it to drain which is unusual for him. He is concerned that the incorrect catheter was placed and also for UTI. He also endorses fevers. Denies any new pain.   Review of Systems  Positive: Dysuria, fevers Negative: Abdominal pain, hematuria  Physical Exam  BP 132/64   Pulse 77   Temp 99.5 F (37.5 C) (Oral)   Resp 16   SpO2 100%  Gen:   Awake, no distress   Resp:  Normal effort  MSK:   Moves extremities without difficulty  Other:    Medical Decision Making  Medically screening exam initiated at 6:01 PM.  Appropriate orders placed.  Ever Librarian, academic was informed that the remainder of the evaluation will be completed by another provider, this initial triage assessment does not replace that evaluation, and the importance of remaining in the ED until their evaluation is complete.    Nestor Lewandowsky 08/13/21 1805    Tegeler, Gwenyth Allegra, MD 08/14/21 [phone]

## 2021-08-16 LAB — URINE CULTURE: Culture: >=100000 — AB

## 2021-08-17 ENCOUNTER — Encounter (HOSPITAL_BASED_OUTPATIENT_CLINIC_OR_DEPARTMENT_OTHER): Payer: Medicare Other | Admitting: Physician Assistant

## 2021-08-17 ENCOUNTER — Telehealth: Payer: Self-pay | Admitting: Emergency Medicine

## 2021-08-17 NOTE — Telephone Encounter (Signed)
Post ED Visit - Positive Culture Follow-up  Culture report reviewed by antimicrobial stewardship pharmacist: [place] Team []  Elenor Quinones, Pharm.D. []  Heide Guile, Pharm.D., BCPS AQ-ID []  Parks Neptune, Pharm.D., BCPS [x]  Alycia Rossetti, Pharm.D., BCPS []  [place], Pharm.D., BCPS, AAHIVP []  Legrand Como, Pharm.D., BCPS, AAHIVP []  Salome Arnt, PharmD, BCPS []  Johnnette Gourd, PharmD, BCPS []  Hughes Better, PharmD, BCPS []  Leeroy Cha, PharmD []  Laqueta Linden, PharmD, BCPS []  Albertina Parr, PharmD  [place] Team []  Leodis Sias, PharmD []  Lindell Spar, PharmD []  Royetta Asal, PharmD []  Graylin Shiver, Rph []  Rema Fendt) Glennon Mac, PharmD []  Arlyn Dunning, PharmD []  Netta Cedars, PharmD []  Dia Sitter, PharmD []  Leone Haven, PharmD []  Gretta Arab, PharmD []  Theodis Shove, PharmD []  Peggyann Juba, PharmD []  Reuel Boom, PharmD   Positive urine culture Treated with cefdinir, organism sensitive to the same and no further patient follow-up is required at this time.  Hazle Nordmann 08/17/2021, 8:11 AM

## 2021-08-23 IMAGING — DX DG CHEST 2V
3 series · 3 of 3 positions shown · non-contrast
Comparison: February 15, 2021

CLINICAL DATA: Shortness of breath

EXAM:
CHEST - 2 VIEW

[x chest ap]
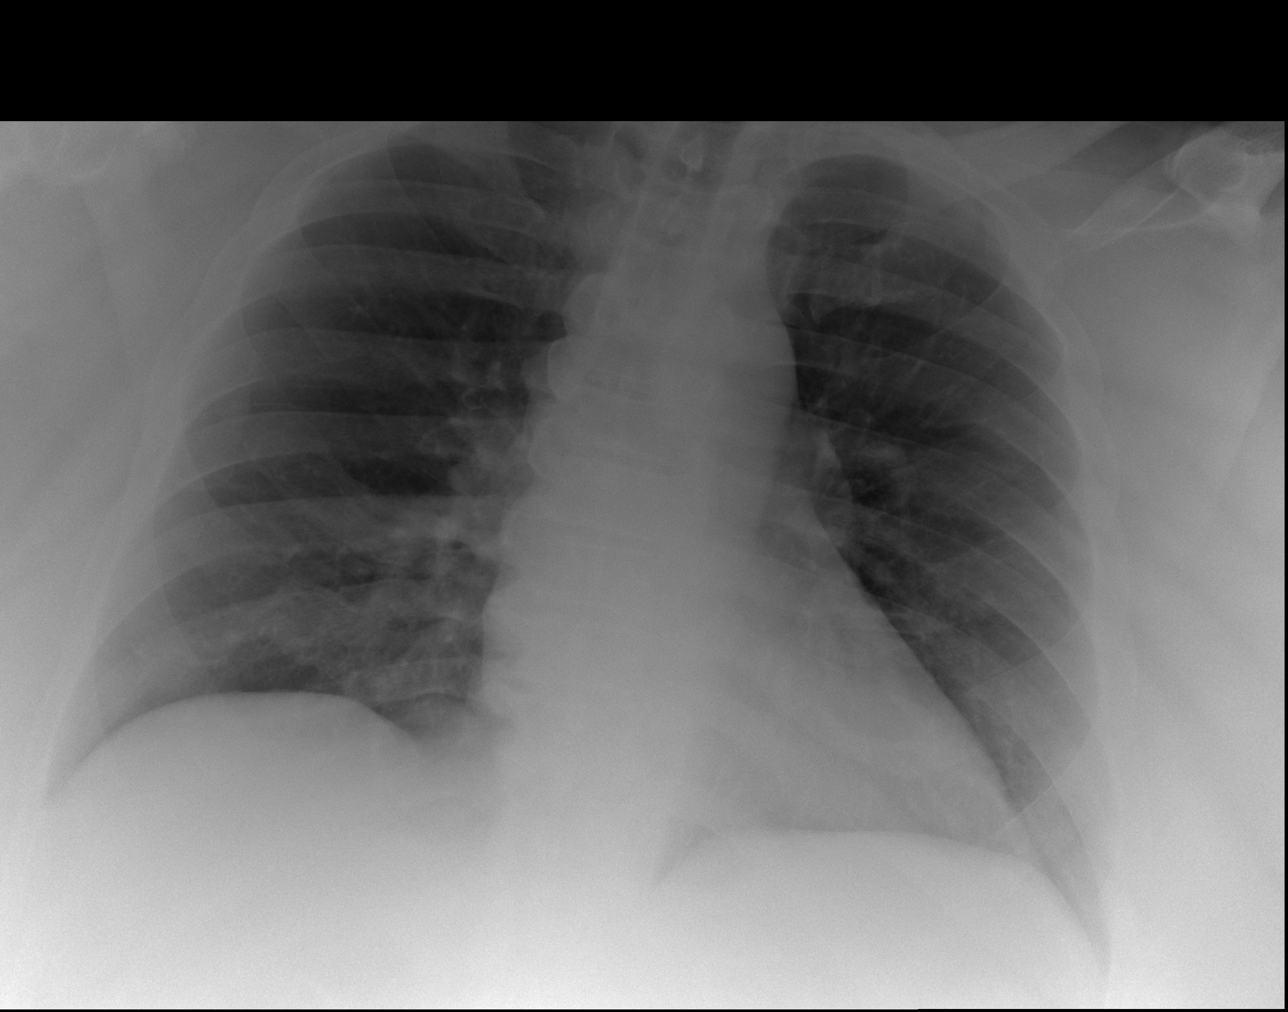

[w chest lat (1 of 2)]
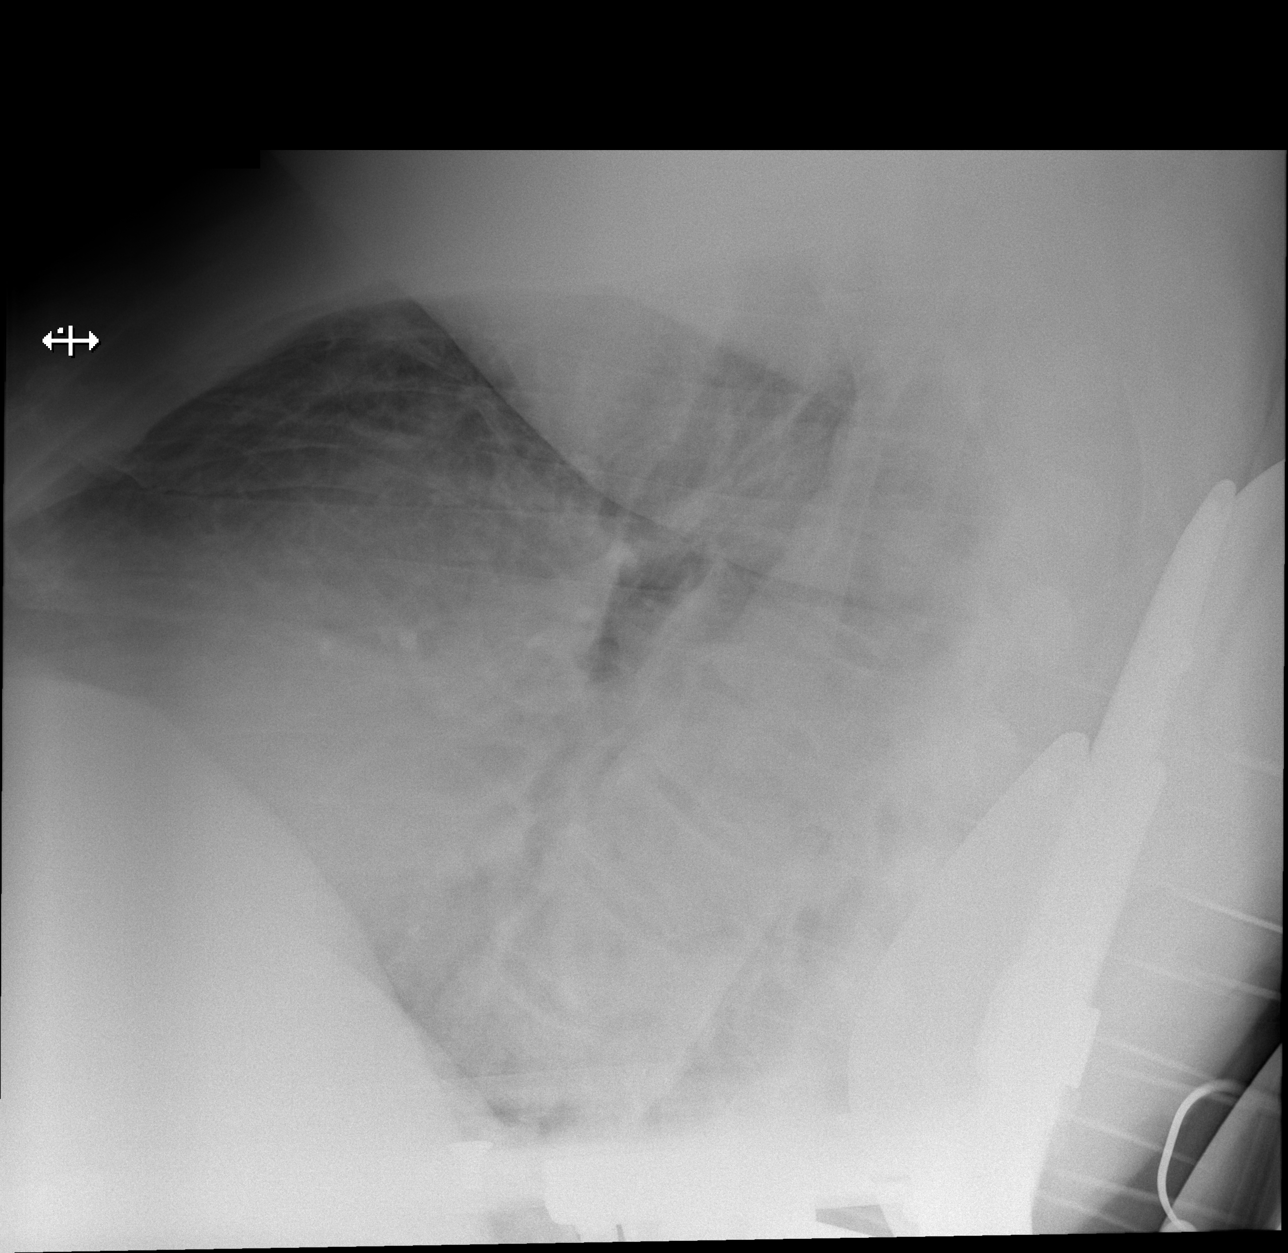

[w chest lat (2 of 2)]
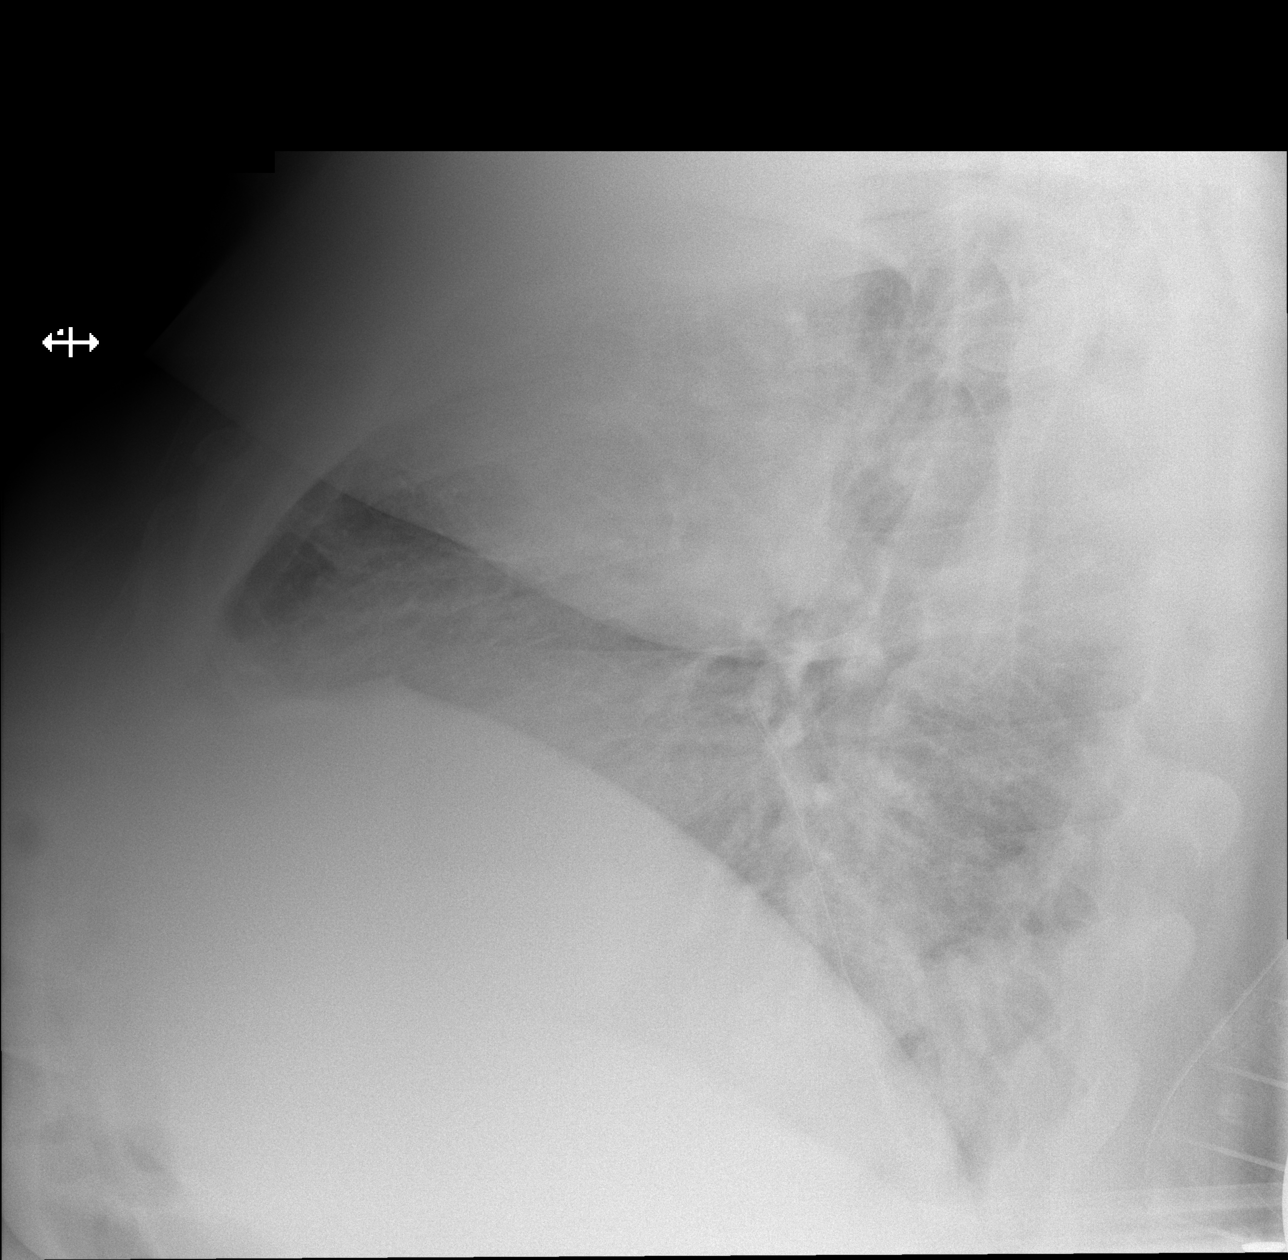

[3 of 3 positions shown; findings below may reference images not displayed]

FINDINGS: There is no edema or airspace opacity. Heart is upper normal in size
with pulmonary vascularity normal. No adenopathy. No bone lesions.
IMPRESSION: Lungs clear.  Heart upper normal in size.  No evident adenopathy.

## 2021-08-31 ENCOUNTER — Encounter (HOSPITAL_BASED_OUTPATIENT_CLINIC_OR_DEPARTMENT_OTHER): Payer: Medicare Other | Admitting: Physician Assistant

## 2021-09-02 ENCOUNTER — Other Ambulatory Visit: Payer: Self-pay | Admitting: Gastroenterology

## 2021-09-07 ENCOUNTER — Encounter (HOSPITAL_BASED_OUTPATIENT_CLINIC_OR_DEPARTMENT_OTHER): Payer: Medicare Other | Admitting: Physician Assistant

## 2021-09-21 ENCOUNTER — Encounter (HOSPITAL_BASED_OUTPATIENT_CLINIC_OR_DEPARTMENT_OTHER): Payer: Medicare Other | Admitting: Physician Assistant

## 2021-10-13 ENCOUNTER — Emergency Department (HOSPITAL_COMMUNITY): Payer: Medicare Other

## 2021-10-13 ENCOUNTER — Inpatient Hospital Stay (HOSPITAL_COMMUNITY)
Admission: EM | Admit: 2021-10-13 | Discharge: 2021-10-21 | DRG: 388 | Disposition: A | Discharge status: Home / Self Care | Payer: Medicare Other | Attending: Internal Medicine | Admitting: Internal Medicine

## 2021-10-13 ENCOUNTER — Encounter (HOSPITAL_COMMUNITY): Payer: Self-pay | Admitting: Radiology

## 2021-10-13 ENCOUNTER — Other Ambulatory Visit: Payer: Self-pay

## 2021-10-13 DIAGNOSIS — G4733 Obstructive sleep apnea (adult) (pediatric): Secondary | ICD-10-CM | POA: Diagnosis present

## 2021-10-13 DIAGNOSIS — L89154 Pressure ulcer of sacral region, stage 4: Secondary | ICD-10-CM | POA: Diagnosis present

## 2021-10-13 DIAGNOSIS — K56600 Partial intestinal obstruction, unspecified as to cause: Secondary | ICD-10-CM | POA: Diagnosis present

## 2021-10-13 DIAGNOSIS — F1120 Opioid dependence, uncomplicated: Secondary | ICD-10-CM | POA: Diagnosis present

## 2021-10-13 DIAGNOSIS — G8220 Paraplegia, unspecified: Secondary | ICD-10-CM | POA: Diagnosis present

## 2021-10-13 DIAGNOSIS — K219 Gastro-esophageal reflux disease without esophagitis: Secondary | ICD-10-CM | POA: Diagnosis present

## 2021-10-13 DIAGNOSIS — D509 Iron deficiency anemia, unspecified: Secondary | ICD-10-CM | POA: Diagnosis present

## 2021-10-13 DIAGNOSIS — N179 Acute kidney failure, unspecified: Secondary | ICD-10-CM | POA: Diagnosis present

## 2021-10-13 DIAGNOSIS — G8929 Other chronic pain: Secondary | ICD-10-CM | POA: Diagnosis present

## 2021-10-13 DIAGNOSIS — K56609 Unspecified intestinal obstruction, unspecified as to partial versus complete obstruction: Secondary | ICD-10-CM | POA: Diagnosis present

## 2021-10-13 DIAGNOSIS — I10 Essential (primary) hypertension: Secondary | ICD-10-CM | POA: Diagnosis present

## 2021-10-13 DIAGNOSIS — Z841 Family history of disorders of kidney and ureter: Secondary | ICD-10-CM

## 2021-10-13 DIAGNOSIS — Z978 Presence of other specified devices: Secondary | ICD-10-CM

## 2021-10-13 DIAGNOSIS — Z6841 Body Mass Index (BMI) 40.0 and over, adult: Secondary | ICD-10-CM | POA: Diagnosis not present

## 2021-10-13 DIAGNOSIS — Z79899 Other long term (current) drug therapy: Secondary | ICD-10-CM

## 2021-10-13 DIAGNOSIS — Z20822 Contact with and (suspected) exposure to covid-19: Secondary | ICD-10-CM | POA: Diagnosis present

## 2021-10-13 DIAGNOSIS — Z87891 Personal history of nicotine dependence: Secondary | ICD-10-CM

## 2021-10-13 DIAGNOSIS — M109 Gout, unspecified: Secondary | ICD-10-CM | POA: Diagnosis present

## 2021-10-13 DIAGNOSIS — Z7982 Long term (current) use of aspirin: Secondary | ICD-10-CM | POA: Diagnosis not present

## 2021-10-13 DIAGNOSIS — E875 Hyperkalemia: Secondary | ICD-10-CM | POA: Diagnosis present

## 2021-10-13 DIAGNOSIS — D638 Anemia in other chronic diseases classified elsewhere: Secondary | ICD-10-CM | POA: Diagnosis present

## 2021-10-13 DIAGNOSIS — E43 Unspecified severe protein-calorie malnutrition: Secondary | ICD-10-CM

## 2021-10-13 DIAGNOSIS — Z933 Colostomy status: Secondary | ICD-10-CM

## 2021-10-13 LAB — CBC WITH DIFFERENTIAL/PLATELET
Abs Immature Granulocytes: 0.07 10*3/uL (ref 0.00–0.07)
Basophils Absolute: 0.1 10*3/uL (ref 0.0–0.1)
Basophils Relative: 0 %
Eosinophils Absolute: 0.4 10*3/uL (ref 0.0–0.5)
Eosinophils Relative: 3 %
HCT: 30.3 % — ABNORMAL LOW (ref 39.0–52.0)
Hemoglobin: 8.9 g/dL — ABNORMAL LOW (ref 13.0–17.0)
Immature Granulocytes: 1 %
Lymphocytes Relative: 18 %
Lymphs Abs: 2.5 10*3/uL (ref 0.7–4.0)
MCH: 26.7 pg (ref 26.0–34.0)
MCHC: 29.4 g/dL — ABNORMAL LOW (ref 30.0–36.0)
MCV: 91 fL (ref 80.0–100.0)
Monocytes Absolute: 0.6 10*3/uL (ref 0.1–1.0)
Monocytes Relative: 4 %
Neutro Abs: 10.3 10*3/uL — ABNORMAL HIGH (ref 1.7–7.7)
Neutrophils Relative %: 74 %
Platelets: 270 10*3/uL (ref 150–400)
RBC: 3.33 MIL/uL — ABNORMAL LOW (ref 4.22–5.81)
RDW: 15.9 % — ABNORMAL HIGH (ref 11.5–15.5)
WBC: 13.9 10*3/uL — ABNORMAL HIGH (ref 4.0–10.5)
nRBC: 0 % (ref 0.0–0.2)

## 2021-10-13 LAB — TROPONIN I (HIGH SENSITIVITY)
Troponin I (High Sensitivity): 5 ng/L (ref <18)
Troponin I (High Sensitivity): 6 ng/L (ref <18)

## 2021-10-13 LAB — COMPREHENSIVE METABOLIC PANEL
ALT: 17 U/L (ref 0–44)
AST: 24 U/L (ref 15–41)
Albumin: 3.4 g/dL — ABNORMAL LOW (ref 3.5–5.0)
Alkaline Phosphatase: 79 U/L (ref 38–126)
Anion gap: 11 (ref 5–15)
BUN: 30 mg/dL — ABNORMAL HIGH (ref 6–20)
CO2: 24 mmol/L (ref 22–32)
Calcium: 8.9 mg/dL (ref 8.9–10.3)
Chloride: 105 mmol/L (ref 98–111)
Creatinine, Ser: 1.35 mg/dL — ABNORMAL HIGH (ref 0.61–1.24)
GFR, Estimated: >60 mL/min (ref >60)
Glucose, Bld: 189 mg/dL — ABNORMAL HIGH (ref 70–99)
Potassium: 5.2 mmol/L — ABNORMAL HIGH (ref 3.5–5.1)
Sodium: 140 mmol/L (ref 135–145)
Total Bilirubin: 0.5 mg/dL (ref 0.3–1.2)
Total Protein: 7.9 g/dL (ref 6.5–8.1)

## 2021-10-13 LAB — LIPASE, BLOOD: Lipase: 33 U/L (ref 11–51)

## 2021-10-13 LAB — RESP PANEL BY RT-PCR (FLU A&B, COVID) ARPGX2
Influenza A by PCR: NEGATIVE
Influenza B by PCR: NEGATIVE
SARS Coronavirus 2 by RT PCR: NEGATIVE

## 2021-10-13 LAB — LACTIC ACID, PLASMA: Lactic Acid, Venous: 1.4 mmol/L (ref 0.5–1.9)

## 2021-10-13 MED ORDER — ONDANSETRON HCL 4 MG/2ML IJ SOLN
4.0000 mg | Freq: Four times a day (QID) | INTRAMUSCULAR | Status: DC | PRN
  Administered 2021-10-14 – 2021-10-16 (×5): 4 mg via INTRAVENOUS
  Filled 2021-10-13 (×6): qty 2

## 2021-10-13 MED ORDER — ALUM & MAG HYDROXIDE-SIMETH 200-200-20 MG/5ML PO SUSP
30.0000 mL | Freq: Once | ORAL | Status: AC
  Administered 2021-10-13: 30 mL via ORAL
  Filled 2021-10-13: qty 30

## 2021-10-13 MED ORDER — ONDANSETRON HCL 4 MG/2ML IJ SOLN
4.0000 mg | Freq: Once | INTRAMUSCULAR | Status: AC
  Administered 2021-10-13: 4 mg via INTRAVENOUS
  Filled 2021-10-13: qty 2

## 2021-10-13 MED ORDER — SODIUM CHLORIDE 0.9 % IV SOLN: INTRAVENOUS | Status: DC

## 2021-10-13 MED ORDER — HYDROMORPHONE HCL 1 MG/ML IJ SOLN
1.0000 mg | Freq: Once | INTRAMUSCULAR | Status: AC
  Administered 2021-10-13: 1 mg via INTRAVENOUS
  Filled 2021-10-13: qty 1

## 2021-10-13 MED ORDER — LACTATED RINGERS IV SOLN: INTRAVENOUS | Status: DC

## 2021-10-13 MED ORDER — ENOXAPARIN SODIUM 80 MG/0.8ML IJ SOSY
70.0000 mg | PREFILLED_SYRINGE | INTRAMUSCULAR | Status: DC
  Administered 2021-10-13 – 2021-10-20 (×8): 70 mg via SUBCUTANEOUS
  Filled 2021-10-13 (×5): qty 0.8
  Filled 2021-10-13: qty 0.7
  Filled 2021-10-13 (×2): qty 0.8

## 2021-10-13 MED ORDER — ENOXAPARIN SODIUM 40 MG/0.4ML IJ SOSY: 40.0000 mg | PREFILLED_SYRINGE | INTRAMUSCULAR | Status: DC

## 2021-10-13 MED ORDER — IOHEXOL 300 MG/ML  SOLN
100.0000 mL | Freq: Once | INTRAMUSCULAR | Status: AC | PRN
  Administered 2021-10-13: 100 mL via INTRAVENOUS

## 2021-10-13 MED ORDER — HYDROMORPHONE HCL 1 MG/ML IJ SOLN
1.0000 mg | INTRAMUSCULAR | Status: DC | PRN
  Administered 2021-10-13 – 2021-10-20 (×18): 1 mg via INTRAVENOUS
  Filled 2021-10-13 (×20): qty 1

## 2021-10-13 MED ORDER — SODIUM CHLORIDE 0.9 % IV SOLN
25.0000 mg | Freq: Once | INTRAVENOUS | Status: AC
  Administered 2021-10-13: 25 mg via INTRAVENOUS
  Filled 2021-10-13: qty 1

## 2021-10-13 MED ORDER — LIDOCAINE HCL URETHRAL/MUCOSAL 2 % EX GEL: 1.0000 "application " | Freq: Once | CUTANEOUS | Status: DC

## 2021-10-13 MED ORDER — LORAZEPAM 2 MG/ML IJ SOLN
1.0000 mg | Freq: Once | INTRAMUSCULAR | Status: AC
  Administered 2021-10-13: 1 mg via INTRAVENOUS
  Filled 2021-10-13: qty 1

## 2021-10-13 MED ORDER — SODIUM CHLORIDE 0.9 % IV BOLUS
1000.0000 mL | Freq: Once | INTRAVENOUS | Status: AC
  Administered 2021-10-13: 1000 mL via INTRAVENOUS

## 2021-10-13 MED ORDER — ZINC OXIDE 40 % EX OINT
TOPICAL_OINTMENT | Freq: Every day | CUTANEOUS | Status: DC
Start: 2021-10-13 — End: 2021-10-21
  Filled 2021-10-13 (×2): qty 57

## 2021-10-13 MED ORDER — DIATRIZOATE MEGLUMINE & SODIUM 66-10 % PO SOLN
90.0000 mL | Freq: Once | ORAL | Status: AC
  Administered 2021-10-13: 90 mL via NASOGASTRIC
  Filled 2021-10-13: qty 90

## 2021-10-13 NOTE — ED Notes (Signed)
Pt yelling. Security at bedside. Blood being drawn by RN. Pt states he is in too much pain.
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
## 2021-10-13 NOTE — ED Notes (Signed)
Pt reports that he is still in pain. Dr. Regenia Skeeter aware

## 2021-10-13 NOTE — ED Triage Notes (Signed)
Pt arrives via EMS from home for c/o abd bloating, constipation, nausea x 2-3 hours. Pt has a colostomy in place with stool present in the bag. Pt reports that he has had a similar issue before and states that it resolved with stool softeners. Pt adds that he is a paraplegic x 41 years. States that he also has a foley and a stage IV decubitus sacral ulcer.
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
## 2021-10-13 NOTE — ED Notes (Signed)
Pt had episode X1-emesis

## 2021-10-13 NOTE — ED Notes (Signed)
Pt screaming and cursing at this RN. Security at bedside

## 2021-10-13 NOTE — ED Notes (Signed)
Security spoke with pt. Pt was calm and appropriate. As soon as security left the room pt hollering out again.

## 2021-10-13 NOTE — ED Notes (Signed)
Dr. Regenia Skeeter made aware. Pt states he is dying. Screaming. [place] at bedside.

## 2021-10-13 NOTE — ED Notes (Signed)
Patient sister called to ask what was being done to help him with pain as he called her complaining. I assured her he was being well-monitored and attended to, and that he was allowed one visitor if she needed peace of mind regarding the care he is receiving. She balked, and got defensive stating she was not going to sit here with him just to watch him in pain. I assured her that the intent of the suggestion for a visit was so that she could witness the care he is receiving and get updated information. I gave her my first name.
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
## 2021-10-14 ENCOUNTER — Inpatient Hospital Stay (HOSPITAL_COMMUNITY): Payer: Medicare Other

## 2021-10-14 DIAGNOSIS — K56609 Unspecified intestinal obstruction, unspecified as to partial versus complete obstruction: Secondary | ICD-10-CM

## 2021-10-14 LAB — COMPREHENSIVE METABOLIC PANEL
ALT: 17 U/L (ref 0–44)
AST: 18 U/L (ref 15–41)
Albumin: 3.3 g/dL — ABNORMAL LOW (ref 3.5–5.0)
Alkaline Phosphatase: 76 U/L (ref 38–126)
Anion gap: 9 (ref 5–15)
BUN: 21 mg/dL — ABNORMAL HIGH (ref 6–20)
CO2: 23 mmol/L (ref 22–32)
Calcium: 8.5 mg/dL — ABNORMAL LOW (ref 8.9–10.3)
Chloride: 107 mmol/L (ref 98–111)
Creatinine, Ser: 1.21 mg/dL (ref 0.61–1.24)
GFR, Estimated: >60 mL/min (ref >60)
Glucose, Bld: 139 mg/dL — ABNORMAL HIGH (ref 70–99)
Potassium: 4.9 mmol/L (ref 3.5–5.1)
Sodium: 139 mmol/L (ref 135–145)
Total Bilirubin: 0.5 mg/dL (ref 0.3–1.2)
Total Protein: 7.7 g/dL (ref 6.5–8.1)

## 2021-10-14 LAB — CBC
HCT: 32.7 % — ABNORMAL LOW (ref 39.0–52.0)
Hemoglobin: 10.1 g/dL — ABNORMAL LOW (ref 13.0–17.0)
MCH: 27.4 pg (ref 26.0–34.0)
MCHC: 30.9 g/dL (ref 30.0–36.0)
MCV: 88.6 fL (ref 80.0–100.0)
Platelets: 275 10*3/uL (ref 150–400)
RBC: 3.69 MIL/uL — ABNORMAL LOW (ref 4.22–5.81)
RDW: 16 % — ABNORMAL HIGH (ref 11.5–15.5)
WBC: 7.6 10*3/uL (ref 4.0–10.5)
nRBC: 0 % (ref 0.0–0.2)

## 2021-10-14 LAB — MAGNESIUM: Magnesium: 1.9 mg/dL (ref 1.7–2.4)

## 2021-10-14 LAB — PHOSPHORUS: Phosphorus: 3.6 mg/dL (ref 2.5–4.6)

## 2021-10-14 LAB — HEMOGLOBIN A1C
Hgb A1c MFr Bld: 5.5 % (ref 4.8–5.6)
Mean Plasma Glucose: 111.15 mg/dL

## 2021-10-14 LAB — LACTIC ACID, PLASMA: Lactic Acid, Venous: 1.6 mmol/L (ref 0.5–1.9)

## 2021-10-14 MED ORDER — CHLORHEXIDINE GLUCONATE CLOTH 2 % EX PADS
6.0000 | MEDICATED_PAD | Freq: Every day | CUTANEOUS | Status: DC
  Administered 2021-10-14 – 2021-10-21 (×8): 6 via TOPICAL

## 2021-10-14 NOTE — Progress Notes (Signed)
Refused cpap.
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
## 2021-10-14 NOTE — Care Management Important Message (Signed)
Important Message  Patient Details  Name: [name] MRN: [id number] Date of Birth: 07-Mar-1966   Medicare Important Message Given:  Yes     Hannah Beat 10/14/2021, 3:23 PM

## 2021-10-15 ENCOUNTER — Inpatient Hospital Stay (HOSPITAL_COMMUNITY): Payer: Medicare Other

## 2021-10-15 LAB — BASIC METABOLIC PANEL
Anion gap: 7 (ref 5–15)
BUN: 25 mg/dL — ABNORMAL HIGH (ref 6–20)
CO2: 28 mmol/L (ref 22–32)
Calcium: 7.9 mg/dL — ABNORMAL LOW (ref 8.9–10.3)
Chloride: 109 mmol/L (ref 98–111)
Creatinine, Ser: 1.34 mg/dL — ABNORMAL HIGH (ref 0.61–1.24)
GFR, Estimated: >60 mL/min (ref >60)
Glucose, Bld: 87 mg/dL (ref 70–99)
Potassium: 4 mmol/L (ref 3.5–5.1)
Sodium: 144 mmol/L (ref 135–145)

## 2021-10-15 LAB — CBC
HCT: 26.3 % — ABNORMAL LOW (ref 39.0–52.0)
Hemoglobin: 8 g/dL — ABNORMAL LOW (ref 13.0–17.0)
MCH: 27.2 pg (ref 26.0–34.0)
MCHC: 30.4 g/dL (ref 30.0–36.0)
MCV: 89.5 fL (ref 80.0–100.0)
Platelets: 221 10*3/uL (ref 150–400)
RBC: 2.94 MIL/uL — ABNORMAL LOW (ref 4.22–5.81)
RDW: 15.7 % — ABNORMAL HIGH (ref 11.5–15.5)
WBC: 6.3 10*3/uL (ref 4.0–10.5)
nRBC: 0 % (ref 0.0–0.2)

## 2021-10-15 MED ORDER — FAMOTIDINE 20 MG PO TABS
40.0000 mg | ORAL_TABLET | Freq: Two times a day (BID) | ORAL | Status: DC
  Administered 2021-10-15 – 2021-10-17 (×6): 40 mg via ORAL
  Filled 2021-10-15 (×6): qty 2

## 2021-10-15 MED ORDER — ASPIRIN EC 81 MG PO TBEC
81.0000 mg | DELAYED_RELEASE_TABLET | Freq: Every day | ORAL | Status: DC
  Administered 2021-10-15 – 2021-10-21 (×7): 81 mg via ORAL
  Filled 2021-10-15 (×7): qty 1

## 2021-10-15 MED ORDER — METHADONE HCL 10 MG PO TABS
50.0000 mg | ORAL_TABLET | Freq: Every day | ORAL | Status: DC
  Administered 2021-10-15 – 2021-10-21 (×7): 50 mg via ORAL
  Filled 2021-10-15 (×7): qty 5

## 2021-10-15 NOTE — Plan of Care (Signed)
°  Problem: Education: Goal: Knowledge of General Education information will improve Description: Including pain rating scale, medication(s)/side effects and non-pharmacologic comfort measures Outcome: Progressing   Problem: Clinical Measurements: Goal: Diagnostic test results will improve Outcome: Progressing Goal: Respiratory complications will improve Outcome: Progressing Goal: Cardiovascular complication will be avoided Outcome: Progressing   Problem: Coping: Goal: Level of anxiety will decrease Outcome: Progressing   Problem: Elimination: Goal: Will not experience complications related to bowel motility Outcome: Not Progressing

## 2021-10-15 NOTE — Hospital Course (Addendum)
Small bowel obstruction Patient presented with acute onset worsening abdominal pain and found to have small bowel obstruction on imaging. Imaging also noted extraluminal air suggesting portal venous gas vs pneumobilia. Lactic acid was normal which is reassuring for lack of ischemia. Patient does have hx of abdominal surgery making adhesions a likely cause of his SBO. Patient NG tube resulted in good output. He was given IV dilaudid for pain and IV zofran for nausea. Surgery was consulted who were in agreement with medical management. He was kept NPO and started on 100 cc/hr of normal saline. By the next day patient endorsed flatulence and started to have bowel movement. NG tube was clamped to see how patient would tolerate this and he was able to tolerate this well. His diet was advanced as tolerated.    Gout Patient had severe right wrist pain. Patient endorsed previous episodes and states has hx of joint pain but no hx of gout. Patient states Omega XL helps with his pain. With his sudden onset, swelling, and erythema this appears consistent with gout flare. Uric acid was ordered and came back elevated at 12.4. Toradol IV given for symptomatic relief followed by NSAIDS.   HTN Patient has hx of htn and home med is tribenzor (combo of Chief Strategy Officer). His BP meds were initially held due to him being NPO.    AKI  Hyperkalemia Patient appears to have normal baseline kidney function but was found to have elevated creatinine at 1.35 on admission. OMV:[id number] ratio is 22 suggested a pre-renal etiology. Calculated serum osmolality is 301. Hyperkalemia appears secondary to AKI. Both resolved with IVF.     Diabetes vs Prediabetes Patient had elevated random glucose at 189. A1c was checked but which was wnl.    Paraplegia Hx of decubitus ulcer Patient has paraplegia and hx of recurrent decubitus ulcers. Caretaker states he applies silver sulfadiazine. His decubitus ulcers appears without  drainage or erythema to suggest infection. Wound consult was placed and they recommended daily cleansing and drying and applying zinc oxide and dressing with silver hydrofiber. No packing recommended. Secured using abdominal pad with medipore clot tape.     GERD Chronic problem for patient. Home med is protonix 20 mg daily with pepcid 20 mg as needed. Patient was started on protonix 20 mg when he was able to tolerate PO.    Obstructive Sleep Apnea Patient has OSA and uses sleep apnea at home. In setting of SBO and potential for vomitng, will hold off CPAP until improvement noted.     Iron Deficiency Anemia Chronic. Hgb 8.9 which is around baseline for the patient. Plan was to discharge pt with supplemental iron due to low iron levels. Ferrelicit 283 mg given for 2 doses.
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
## 2021-10-16 DIAGNOSIS — K56609 Unspecified intestinal obstruction, unspecified as to partial versus complete obstruction: Secondary | ICD-10-CM | POA: Diagnosis not present

## 2021-10-16 LAB — IRON AND TIBC
Iron: 44 ug/dL — ABNORMAL LOW (ref 45–182)
Saturation Ratios: 16 % — ABNORMAL LOW (ref 17.9–39.5)
TIBC: 269 ug/dL (ref 250–450)
UIBC: 225 ug/dL

## 2021-10-16 LAB — BASIC METABOLIC PANEL
Anion gap: 9 (ref 5–15)
BUN: 16 mg/dL (ref 6–20)
CO2: 25 mmol/L (ref 22–32)
Calcium: 7.9 mg/dL — ABNORMAL LOW (ref 8.9–10.3)
Chloride: 109 mmol/L (ref 98–111)
Creatinine, Ser: 1.05 mg/dL (ref 0.61–1.24)
GFR, Estimated: >60 mL/min (ref >60)
Glucose, Bld: 85 mg/dL (ref 70–99)
Potassium: 4.2 mmol/L (ref 3.5–5.1)
Sodium: 143 mmol/L (ref 135–145)

## 2021-10-16 LAB — MAGNESIUM: Magnesium: 1.7 mg/dL (ref 1.7–2.4)

## 2021-10-16 LAB — FERRITIN: Ferritin: 59 ng/mL (ref 24–336)

## 2021-10-16 MED ORDER — DIATRIZOATE MEGLUMINE & SODIUM 66-10 % PO SOLN: 90.0000 mL | Freq: Once | ORAL | Status: DC

## 2021-10-16 MED ORDER — MAGNESIUM SULFATE 2 GM/50ML IV SOLN
2.0000 g | Freq: Once | INTRAVENOUS | Status: AC
  Administered 2021-10-16: 2 g via INTRAVENOUS
  Filled 2021-10-16: qty 50

## 2021-10-16 NOTE — Progress Notes (Signed)
Wound care done as per order.
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
## 2021-10-17 ENCOUNTER — Inpatient Hospital Stay (HOSPITAL_COMMUNITY): Payer: Medicare Other

## 2021-10-17 MED ORDER — SALINE SPRAY 0.65 % NA SOLN
1.0000 | NASAL | Status: DC | PRN
  Administered 2021-10-17 – 2021-10-18 (×2): 1 via NASAL
  Filled 2021-10-17: qty 44

## 2021-10-17 MED ORDER — DIATRIZOATE MEGLUMINE & SODIUM 66-10 % PO SOLN
90.0000 mL | Freq: Once | ORAL | Status: AC
  Administered 2021-10-17: 90 mL via ORAL
  Filled 2021-10-17: qty 90

## 2021-10-17 MED ORDER — DICLOFENAC SODIUM 1 % EX GEL
2.0000 g | Freq: Two times a day (BID) | CUTANEOUS | Status: DC
  Administered 2021-10-17 – 2021-10-21 (×8): 2 g via TOPICAL
  Filled 2021-10-17: qty 100
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
## 2021-10-17 NOTE — Progress Notes (Signed)
Wound care done per MD orders

## 2021-10-17 NOTE — Progress Notes (Signed)
CPAP machine set up via [facility], auto settings (max 18, min 6). Patient unable to wear mask. He will have someone bring mask from home tomorrow. Remains on 3L [place]. Spo2 99%.

## 2021-10-17 NOTE — Progress Notes (Signed)
°  Transition of Care [facility]) Screening Note   Patient Details  Name: [name] Date of Birth: 09-14-1966   Transition of Care [facility]) CM/SW Contact:    Dawayne Patricia, RN Phone Number: 10/17/2021, 3:01 PM    Transition of Care Department [facility]) has reviewed patient and no TOC needs have been identified at this time. We will continue to monitor patient advancement through interdisciplinary progression rounds. If new patient transition needs arise, please place a TOC consult.

## 2021-10-18 ENCOUNTER — Inpatient Hospital Stay (HOSPITAL_COMMUNITY): Payer: Medicare Other

## 2021-10-18 DIAGNOSIS — G4733 Obstructive sleep apnea (adult) (pediatric): Secondary | ICD-10-CM

## 2021-10-18 DIAGNOSIS — G8220 Paraplegia, unspecified: Secondary | ICD-10-CM

## 2021-10-18 DIAGNOSIS — D509 Iron deficiency anemia, unspecified: Secondary | ICD-10-CM

## 2021-10-18 DIAGNOSIS — I10 Essential (primary) hypertension: Secondary | ICD-10-CM

## 2021-10-18 DIAGNOSIS — L89154 Pressure ulcer of sacral region, stage 4: Secondary | ICD-10-CM

## 2021-10-18 LAB — BASIC METABOLIC PANEL
Anion gap: 6 (ref 5–15)
BUN: 8 mg/dL (ref 6–20)
CO2: 27 mmol/L (ref 22–32)
Calcium: 8.5 mg/dL — ABNORMAL LOW (ref 8.9–10.3)
Chloride: 104 mmol/L (ref 98–111)
Creatinine, Ser: 1.08 mg/dL (ref 0.61–1.24)
GFR, Estimated: >60 mL/min (ref >60)
Glucose, Bld: 111 mg/dL — ABNORMAL HIGH (ref 70–99)
Potassium: 4.3 mmol/L (ref 3.5–5.1)
Sodium: 137 mmol/L (ref 135–145)

## 2021-10-18 LAB — MAGNESIUM: Magnesium: 1.8 mg/dL (ref 1.7–2.4)

## 2021-10-18 MED ORDER — ACETAMINOPHEN 500 MG PO TABS
1000.0000 mg | ORAL_TABLET | Freq: Three times a day (TID) | ORAL | Status: DC | PRN
  Administered 2021-10-18 – 2021-10-19 (×2): 1000 mg via ORAL
  Filled 2021-10-18 (×2): qty 2

## 2021-10-18 MED ORDER — PANTOPRAZOLE SODIUM 40 MG PO TBEC
40.0000 mg | DELAYED_RELEASE_TABLET | Freq: Every day | ORAL | Status: DC
  Administered 2021-10-18 – 2021-10-21 (×4): 40 mg via ORAL
  Filled 2021-10-18 (×4): qty 1

## 2021-10-18 MED ORDER — SODIUM CHLORIDE 0.9 % IV SOLN
250.0000 mg | Freq: Every day | INTRAVENOUS | Status: AC
  Administered 2021-10-18 – 2021-10-19 (×2): 250 mg via INTRAVENOUS
  Filled 2021-10-18 (×2): qty 20

## 2021-10-18 MED ORDER — SACCHAROMYCES BOULARDII 250 MG PO CAPS
250.0000 mg | ORAL_CAPSULE | Freq: Two times a day (BID) | ORAL | Status: DC
  Administered 2021-10-18 – 2021-10-21 (×7): 250 mg via ORAL
  Filled 2021-10-18 (×7): qty 1

## 2021-10-18 NOTE — Progress Notes (Signed)
Patient declines CPAP use at this time due to his mask form home would not work today. He states he has a new mask arriving tomorrow and he will try to use it. Machine on stand by at bedside. Patient unable to tolerate hospital mask.

## 2021-10-18 NOTE — Progress Notes (Signed)
Patient has c/o severe pain to left wrist. 10/10. Stated he takes some OTC med for his arthritis. Patel,MD made aware. New orders received. Pain meds were given as ordered. Will continue to close monitor patient.
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
## 2021-10-19 DIAGNOSIS — M109 Gout, unspecified: Secondary | ICD-10-CM

## 2021-10-19 LAB — BASIC METABOLIC PANEL
Anion gap: 7 (ref 5–15)
BUN: 10 mg/dL (ref 6–20)
CO2: 27 mmol/L (ref 22–32)
Calcium: 8.5 mg/dL — ABNORMAL LOW (ref 8.9–10.3)
Chloride: 105 mmol/L (ref 98–111)
Creatinine, Ser: 1.24 mg/dL (ref 0.61–1.24)
GFR, Estimated: >60 mL/min (ref >60)
Glucose, Bld: 137 mg/dL — ABNORMAL HIGH (ref 70–99)
Potassium: 4.1 mmol/L (ref 3.5–5.1)
Sodium: 139 mmol/L (ref 135–145)

## 2021-10-19 LAB — URIC ACID: Uric Acid, Serum: 12.4 mg/dL — ABNORMAL HIGH (ref 3.7–8.6)

## 2021-10-19 MED ORDER — ACETAMINOPHEN 500 MG PO TABS
1000.0000 mg | ORAL_TABLET | Freq: Three times a day (TID) | ORAL | Status: DC
  Administered 2021-10-19 – 2021-10-21 (×7): 1000 mg via ORAL
  Filled 2021-10-19 (×7): qty 2

## 2021-10-19 MED ORDER — IBUPROFEN 600 MG PO TABS: 600.0000 mg | ORAL_TABLET | Freq: Four times a day (QID) | ORAL | Status: DC | PRN

## 2021-10-19 MED ORDER — KETOROLAC TROMETHAMINE 15 MG/ML IJ SOLN
30.0000 mg | Freq: Three times a day (TID) | INTRAMUSCULAR | Status: AC
  Administered 2021-10-19 – 2021-10-20 (×3): 30 mg via INTRAVENOUS
  Filled 2021-10-19 (×3): qty 2

## 2021-10-19 MED ORDER — IBUPROFEN 600 MG PO TABS: 600.0000 mg | ORAL_TABLET | Freq: Three times a day (TID) | ORAL | Status: DC
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
## 2021-10-19 NOTE — Final Consult Note (Signed)
Consultant Final Sign-Off Note    Assessment/Final recommendations  [name] is a 56 y.o. male followed by me for SBO  Radiographically and clinically resolving. Tolerating soft diet without abdominal pain or n/v. Having bowel function. NT on exam. Contrast in colon on xray. We will sign off.     Straightforward Medical Decision Making   Wound care (if applicable):    Diet at discharge:  Soft/low residue diet for 2-3 weeks followed by high fiber diet thereafter.    Activity at discharge: per primary team   Follow-up appointment:  PRN   Pending results:  Unresulted Labs (From admission, onward)    None        Medication recommendations: Can continue probiotics and bowel regimen at d/c   Other recommendations: None    Thank you for allowing [place] to participate in the care of your patient!  Please consult [place] again if you have further needs for your patient.  Barth Kirks [facility] 10/19/2021 9:40 AM    Subjective   Reports L wrist pain. Reports his abdominal pain has completely resolved. He is tolerating a soft diet without n/v or abdominal pain/distension. Having colostomy output.   Objective  Vital signs in last 24 hours: Temp:  [98.1 F (36.7 C)-99.7 F (37.6 C)] 98.1 F (36.7 C) (01/18 0742) Pulse Rate:  [60-81] 60 (01/18 0742) Resp:  [16-18] 16 (01/18 0742) BP: (107-147)/(60-71) 107/60 (01/18 0742) SpO2:  [99 %-100 %] 99 % (01/18 0742)  Gen:  alert, nad, pleasant  Pulm: rate and effort normal Abd: Obese, NT, +BS. Stoma pink, budded and viable appearing. Air and brown liquid stool in ostomy bag.  Psych: A&Ox3  Skin: no rashes noted, warm and dry     Pertinent labs and Studies: No results for input(s): WBC, HGB, HCT in the last 72 hours. BMET Recent Labs    10/18/21 0820  NA 137  K 4.3  CL 104  CO2 27  GLUCOSE 111*  BUN 8  CREATININE 1.08  CALCIUM 8.5*   No results for input(s): LABURIN in the last 72 hours. Results for orders placed or  performed during the hospital encounter of 10/13/21  Resp Panel by RT-PCR (Flu A&B, Covid) Nasopharyngeal Swab     Status: None   Collection Time: 10/13/21 12:45 PM   Specimen: Nasopharyngeal Swab; Nasopharyngeal(NP) swabs in vial transport medium  Result Value Ref Range Status   SARS Coronavirus 2 by RT PCR NEGATIVE NEGATIVE Final    Comment: (NOTE) SARS-CoV-2 target nucleic acids are NOT DETECTED.  The SARS-CoV-2 RNA is generally detectable in upper respiratory specimens during the acute phase of infection. The lowest concentration of SARS-CoV-2 viral copies this assay can detect is 138 copies/mL. A negative result does not preclude SARS-Cov-2 infection and should not be used as the sole basis for treatment or other patient management decisions. A negative result may occur with  improper specimen collection/handling, submission of specimen other than nasopharyngeal swab, presence of viral mutation(s) within the areas targeted by this assay, and inadequate number of viral copies(<138 copies/mL). A negative result must be combined with clinical observations, patient history, and epidemiological information. The expected result is Negative.  Fact Sheet for Patients:  EntrepreneurPulse.com.au  Fact Sheet for Healthcare Providers:  IncredibleEmployment.be  This test is no t yet approved or cleared by the [place] FDA and  has been authorized for detection and/or diagnosis of SARS-CoV-2 by FDA under an Emergency Use Authorization (EUA). This EUA will remain  in effect (meaning  this test can be used) for the duration of the COVID-19 declaration under Section 564(b)(1) of the Act, 21 U.S.C.section 360bbb-3(b)(1), unless the authorization is terminated  or revoked sooner.       Influenza A by PCR NEGATIVE NEGATIVE Final   Influenza B by PCR NEGATIVE NEGATIVE Final    Comment: (NOTE) The Xpert Xpress SARS-CoV-2/FLU/RSV plus assay is intended as  an aid in the diagnosis of influenza from Nasopharyngeal swab specimens and should not be used as a sole basis for treatment. Nasal washings and aspirates are unacceptable for Xpert Xpress SARS-CoV-2/FLU/RSV testing.  Fact Sheet for Patients: EntrepreneurPulse.com.au  Fact Sheet for Healthcare Providers: IncredibleEmployment.be  This test is not yet approved or cleared by the [place] FDA and has been authorized for detection and/or diagnosis of SARS-CoV-2 by FDA under an Emergency Use Authorization (EUA). This EUA will remain in effect (meaning this test can be used) for the duration of the COVID-19 declaration under Section 564(b)(1) of the Act, 21 U.S.C. section 360bbb-3(b)(1), unless the authorization is terminated or revoked.  Performed at [facility], [place] [address], [place], [place] [address]     Imaging: No results found.

## 2021-10-19 NOTE — Progress Notes (Signed)
Patient declined CPAP use at this time due to he didn't receive his new mask today and he is unable to use the hospital mask. Patient currently on 2L [place]. Spo2 95%. CPAP at bedside if needed.

## 2021-10-20 DIAGNOSIS — K56609 Unspecified intestinal obstruction, unspecified as to partial versus complete obstruction: Secondary | ICD-10-CM | POA: Diagnosis not present

## 2021-10-20 DIAGNOSIS — M109 Gout, unspecified: Secondary | ICD-10-CM | POA: Diagnosis not present

## 2021-10-20 MED ORDER — COLCHICINE 0.6 MG PO TABS
0.6000 mg | ORAL_TABLET | Freq: Two times a day (BID) | ORAL | Status: DC
  Administered 2021-10-21: 0.6 mg via ORAL
  Filled 2021-10-20 (×2): qty 1

## 2021-10-20 MED ORDER — KETOROLAC TROMETHAMINE 15 MG/ML IJ SOLN
30.0000 mg | Freq: Four times a day (QID) | INTRAMUSCULAR | Status: DC
Start: 2021-10-20 — End: 2021-10-20

## 2021-10-20 MED ORDER — PREDNISONE 20 MG PO TABS
40.0000 mg | ORAL_TABLET | Freq: Every day | ORAL | Status: DC
  Administered 2021-10-20 – 2021-10-21 (×2): 40 mg via ORAL
  Filled 2021-10-20 (×2): qty 2

## 2021-10-20 MED ORDER — COLCHICINE 0.6 MG PO TABS
1.2000 mg | ORAL_TABLET | Freq: Once | ORAL | Status: AC
  Administered 2021-10-20: 1.2 mg via ORAL
  Filled 2021-10-20: qty 2

## 2021-10-20 NOTE — Progress Notes (Signed)
Pt. Refused cpap due to not having the mask of his choice. RT informed pt. To notify if he changes his mind.

## 2021-10-20 NOTE — Discharge Summary (Signed)
Name: [name] MRN: [id number] DOB: 1965-12-25 56 y.o. PCP: [name], MD  Date of Admission: 10/13/2021  9:02 AM Date of Discharge: 10/21/21 Attending Physician: Lucious Groves, DO  Discharge Diagnosis: Principal Problem:   Small bowel obstruction ([place]) Active Problems:   Paraplegia ([place])   Chronic pain   Decubitus ulcer of sacral region, stage 4 (HCC)   Chronic indwelling Foley catheter   Acute gout of left wrist   Discharge Medications: Allergies as of 10/21/2021   No Known Allergies      Medication List     TAKE these medications    aspirin EC 81 MG tablet Take 81 mg by mouth every morning. Swallow whole.   Blood Pressure Monitor Devi Please provide patient with insurance approved blood pressure monitor. ICD10 I.10   CALCIUM-D PO Take 1 tablet by mouth every morning.   colchicine 0.6 MG tablet Take 1 tablet (0.6 mg total) by mouth 2 (two) times daily for 10 days.   diclofenac Sodium 1 % Gel Commonly known as: VOLTAREN Apply 1 application topically 2 (two) times daily.   famotidine 40 MG tablet Commonly known as: PEPCID TAKE 1 TABLET (40 MG TOTAL) BY MOUTH IN THE MORNING AND AT BEDTIME. What changed: when to take this   GAS-X PO Take 2 tablets by mouth at bedtime. chewable   IBGARD PO Take 2 capsules by mouth at bedtime.   liver oil-zinc oxide 40 % ointment Commonly known as: DESITIN Apply 1 application topically daily.   lubiprostone 24 MCG capsule Commonly known as: AMITIZA TAKE 1 CAPSULE (24 MCG TOTAL) BY MOUTH 2 (TWO) TIMES DAILY WITH A MEAL.   METHADONE HCL PO Take 50 mg by mouth daily.   Olmesartan-amLODIPine-HCTZ 40-10-25 MG Tabs Take 1 tablet by mouth daily.   OVER THE COUNTER MEDICATION Take 3 capsules by mouth every morning. Omega XL   polyethylene glycol 17 g packet Commonly known as: MIRALAX / GLYCOLAX Take 17 g by mouth every morning.   predniSONE 20 MG tablet Commonly known as: DELTASONE Take 2 tablets  (40 mg total) by mouth daily with breakfast for 3 days. Start taking on: October 22, 2021   saccharomyces boulardii 250 MG capsule Commonly known as: Florastor Take 1 capsule (250 mg total) by mouth 2 (two) times daily.   VITAMIN C PO Take 1 tablet by mouth every morning.               Discharge Care Instructions  (From admission, onward)           Start     Ordered   10/21/21 0000  Discharge wound care:       Comments: Daily soap and water cleanse, to buttocks, groin and perineal areas, pat dry, apply zinc oxide to periwound and dress wounds with silver hydrofiber (Aquacel Ag+ Sheliah Hatch # [id number]). Do not pack into gluteal wound, simply place dressing over this part of the wound.  (All notes report that patient refuses packing of this wound.) This is to be topped with an ABD pad and secured with Medipore cloth breathable tape.   10/21/21 1132            Disposition and follow-up:   [name] was discharged from [facility] in Stable condition.  At the hospital follow up visit please address:  Abdominal Pain: Patient had SBO that was medically managed. He was able to tolerated a diet without any abdominal pain, nausea and vomiting. Please ensure patient is  doing well.   Acute gout flare of the left wrist: Please repeat uric acid level to determine if patient has need for urate lower agents.   Decubitus Sacral Ulcer: Ensure patient is taking care of his chronic ulcers appropriately.  Iron Deficiency anemia: Please follow up for resolution at some point.   2.  Labs / imaging needed at time of follow-up: CBC, BMP  3.  Pending labs/ test needing follow-up: NA  Follow-up Appointments:   Hospital Course by problem list: Small bowel obstruction Patient presented with acute onset worsening abdominal pain and found to have small bowel obstruction on imaging. Imaging also noted extraluminal air suggesting portal venous gas vs  pneumobilia. Lactic acid was normal which was reassuring for lack of ischemia. Patient does have hx of abdominal surgery making adhesions a likely cause of his SBO. Surgery was consulted who were in agreement with medical management. NG tube was placed and patient recovered with medical management without the need for surgery. He was given IV dilaudid for pain and IV zofran for nausea. NG tube was clamped and then removed as patient was able to tolerate PO well without abdominal pain, nausea, or vomiting. His diet was advanced as tolerated.    Gout Patient had severe right wrist pain. Patient endorsed previous episodes and states has hx of joint pain but no hx of gout. Patient states Omega XL helps with his pain. With his sudden onset, swelling, and erythema this appear consistent with gout flare. Uric acid was ordered and came back elevated at 12.4. Toradol IV given for symptomatic relief x3 without relief. Colchicine and prednisone 40 mg were started with and improvement noted. He was discharged with prednisone for 3 days to complete 5 day course.   HTN Patient has hx of htn and home med is tribenzor (combo of Chief Strategy Officer). His BP meds were initially held due to him being NPO. They were held throughout his hospital course due to normotensive blood pressures. Home bp meds restarted at discharge.    AKI  Hyperkalemia Patient appears to have normal baseline kidney function but was found to have elevated creatinine at 1.35 on admission. HDQ:QIWLNLGXQJ ratio is 22 suggested a pre-renal etiology. Calculated serum osmolality is 301. Hyperkalemia appears secondary to AKI. Both resolved with IVF.     Paraplegia Hx of decubitus ulcer Patient has paraplegia and hx of recurrent decubitus ulcers. Caretaker states he applies silver sulfadiazine. His decubitus ulcers appeared. Wound consult was placed and the ulcer was managed based on their recommendations.     GERD Chronic problem for patient.  Patient was started on protonix 20 mg when he was able to tolerate PO. He was discharged with his home medication.    Obstructive Sleep Apnea Patient has OSA and uses sleep apnea at home. In setting of SBO and potential for vomitng, CPAP was initially held. It was later resumed but patient declined due to hospital mask being uncomfortable.   Iron Deficiency Anemia Chronic. Hgb 8.9 which is around baseline for the patient. 2 g iron deficit calculated. Iron 44, Ferritin 59. Plan was to discharge pt with supplemental iron due to low iron levels. Ferrelicit 194 mg given for 2 doses. He was advised to take oral iron supplement ferrous sulfate 325 mg daily.   Discharge Subjective: Patient states he is doing well. No acute concerns voiced. His wrist is doing better than yesterday with less pain and erythema.  Discharge Exam:   BP (!) 135/59 (BP Location: Right Arm)  Pulse 69    Temp (!) 97.5 F (36.4 C) (Oral)    Resp 19    Ht 6' (1.829 m)    Wt (!) 147.9 kg    SpO2 100%    BMI 44.21 kg/m  Physical Exam General: NAD Head: Normocephalic without scalp lesions.  Neck: Neck supple with full range of motion (ROM).  Lungs: Normal work of breathing. No accessory muscle use.  Cardiovascular: Normal heart sounds, no r/m/g, 2+ radial pulses. Abdomen: No TTP, normal bowel sounds MSK: Wrist has more mobility compared to prior evaluations. Swelling and erythema significantly as well.  Neuro: Alert and oriented. CN grossly intact Psych: Normal mood and normal affect    Pertinent Labs, Studies, and Procedures:  CBC Latest Ref Rng & Units 10/15/2021 10/14/2021 10/13/2021  WBC 4.0 - 10.5 K/uL 6.3 7.6 13.9(H)  Hemoglobin 13.0 - 17.0 g/dL 8.0(L) 10.1(L) 8.9(L)  Hematocrit 39.0 - 52.0 % 26.3(L) 32.7(L) 30.3(L)  Platelets 150 - 400 K/uL 221 275 270    CMP Latest Ref Rng & Units 10/19/2021 10/18/2021 10/16/2021  Glucose 70 - 99 mg/dL 137(H) 111(H) 85  BUN 6 - 20 mg/dL 10 8 16   Creatinine 0.61 - 1.24 mg/dL 1.24  1.08 1.05  Sodium 135 - 145 mmol/L 139 137 143  Potassium 3.5 - 5.1 mmol/L 4.1 4.3 4.2  Chloride 98 - 111 mmol/L 105 104 109  CO2 22 - 32 mmol/L 27 27 25   Calcium 8.9 - 10.3 mg/dL 8.5(L) 8.5(L) 7.9(L)  Total Protein 6.5 - 8.1 g/dL - - -  Total Bilirubin 0.3 - 1.2 mg/dL - - -  Alkaline Phos 38 - 126 U/L - - -  AST 15 - 41 U/L - - -  ALT 0 - 44 U/L - - -   DG Abd 1 View  Result Date: 10/14/2021 CLINICAL DATA:  Abdominal pain and bloating. EXAM: ABDOMEN - 1 VIEW COMPARISON:  Same day. FINDINGS: Dilated small bowel loops are again noted. Distal tip of nasogastric tube is seen in proximal stomach. IMPRESSION: Distal tip of nasogastric tube seen in proximal stomach. Dilated small bowel loops are noted concerning for distal small bowel obstruction. Electronically Signed   By: Marijo Conception M.D.   On: 10/14/2021 13:24   CT ABDOMEN PELVIS W CONTRAST  Result Date: 10/13/2021 CLINICAL DATA:  Abdominal distension, nausea, concern for bowel obstruction. Paraplegic. Colostomy EXAM: CT ABDOMEN AND PELVIS WITH CONTRAST TECHNIQUE: Multidetector CT imaging of the abdomen and pelvis was performed using the standard protocol following bolus administration of intravenous contrast. CONTRAST:  174mL OMNIPAQUE IOHEXOL 300 MG/ML  SOLN COMPARISON:  CT 04/07/2021 FINDINGS: Lower chest: Lung bases are clear. Hepatobiliary: Several foci linear gas centrally within the LEFT hepatic lobe (image 19/3) which are peripheral. No evidence of gas within the portal vein or superior mesenteric vein. No gas within the common bile duct. Pancreas: No pancreatic inflammation. No pancreatic duct dilatation. Spleen: Normal Adrenals/urinary tract: Adrenal glands and kidneys are normal. No renal obstruction. Simple fluid attenuation cyst of the LEFT kidney. Ureters normal. Bladder decompressed. Foley catheter within the bladder. Stomach/Bowel: NG tube in stomach. Duodenum is normal. There are dilated loops of mid small bowel measuring up to  4.2 cm (image 59/3). Air-fluid levels within the dilated small bowel. Distal small bowel is decompressed leading up to cecum. The colon is decompressed. LEFT abdominal wall colostomy. Findings suggest small bowel obstruction with transition poor in the RIGHT upper quadrant region. Vascular/Lymphatic: Abdominal aorta is normal caliber. No periportal or  retroperitoneal adenopathy. No pelvic adenopathy. Reproductive: Prostate unremarkable. Other: Decubitus ulcer with gas extending to the RIGHT inferior ischium. No change from prior Musculoskeletal: Chronic dislocation of the LEFT hip. No acute findings. Disuse atrophy of the psoas muscles IMPRESSION: 1. Small bowel obstruction pattern with potential transition point in the RIGHT upper quadrant region. Distal small bowel is collapsed. 2. Small volume linear gas within the central LEFT hepatic lobe. Differential includes portal venous gas versus pneumobilia. No evidence of prior sphincterotomy therefore finding concerning for portal venous gas which is a concerning finding in the setting of small bowel obstruction. 3. Post partial colectomy and colostomy anatomy. Electronically Signed   By: Suzy Bouchard M.D.   On: 10/13/2021 14:20   DG Chest Portable 1 View  Result Date: 10/13/2021 CLINICAL DATA:  Chest pain EXAM: PORTABLE CHEST 1 VIEW COMPARISON:  04/07/2021 FINDINGS: The heart size and mediastinal contours are within normal limits. Low lung volumes. No focal airspace consolidation, pleural effusion, or pneumothorax. Degenerative changes of the shoulders. IMPRESSION: No active disease. Electronically Signed   By: Davina Poke D.O.   On: 10/13/2021 10:00   DG Abd Portable 1V-Small Bowel Obstruction Protocol-initial, 8 hr delay  Result Date: 10/14/2021 CLINICAL DATA:  Bowel obstruction.  8 hour protocol. EXAM: PORTABLE ABDOMEN - 1 VIEW COMPARISON:  CT of the abdomen pelvis dated 10/13/2021. FINDINGS: Enteric tube with tip in the left hemiabdomen, likely in  the body of the stomach. The dilated small bowel loops measure up to 6.7 cm. Air is noted in the colon. Metallic density over the L1-L2. IMPRESSION: Persistent dilatation of small-bowel loops. Continued follow-up recommended. Electronically Signed   By: Anner Crete M.D.   On: 10/14/2021 02:15     Discharge Instructions: Discharge Instructions     Call MD for:  difficulty breathing, headache or visual disturbances   Complete by: As directed    Call MD for:  extreme fatigue   Complete by: As directed    Call MD for:  hives   Complete by: As directed    Call MD for:  persistant dizziness or light-headedness   Complete by: As directed    Call MD for:  persistant nausea and vomiting   Complete by: As directed    Call MD for:  redness, tenderness, or signs of infection (pain, swelling, redness, odor or green/yellow discharge around incision site)   Complete by: As directed    Call MD for:  severe uncontrolled pain   Complete by: As directed    Call MD for:  temperature >100.4   Complete by: As directed    Diet - low sodium heart healthy   Complete by: As directed    Discharge wound care:   Complete by: As directed    Daily soap and water cleanse, to buttocks, groin and perineal areas, pat dry, apply zinc oxide to periwound and dress wounds with silver hydrofiber (Aquacel Ag+ Sheliah Hatch # [id number]). Do not pack into gluteal wound, simply place dressing over this part of the wound.  (All notes report that patient refuses packing of this wound.) This is to be topped with an ABD pad and secured with Medipore cloth breathable tape.   Increase activity slowly   Complete by: As directed       Mr. [name], you presented with abdominal pain and were found to have a blockage in small bowel. The blockage was medically managed and no surgery was required. You were able to tolerate food without any belly pain,  nausea or vomiting. You had bowel movements as well.   In the hospital your left  wrist started hurting, it appears you may have gout. We are giving you Prednisone 40 mg for 3 days as you have received it for 2 days in the hospital. We will also keep you on the colchicine 0.6 mg until your hospital follow up.   Your iron levels were low, you were given IV iron in the hospital, I would like for you to take oral iron supplement that you can find over the counter: Ferrous Sulfate 325 mg OTC.  Please follow up in the clinic at 10/31/21 at 2:15 pm.  It was a pleasure taking care of you!  Signed: [name], MD Tillie Rung. [facility] Internal Medicine Residency, PGY-1  10/21/2021, 11:36 AM   Pager: [phone]
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
## 2021-10-21 ENCOUNTER — Telehealth: Payer: Self-pay

## 2021-10-21 ENCOUNTER — Other Ambulatory Visit (HOSPITAL_COMMUNITY): Payer: Self-pay

## 2021-10-21 MED ORDER — SACCHAROMYCES BOULARDII 250 MG PO CAPS
250.0000 mg | ORAL_CAPSULE | Freq: Two times a day (BID) | ORAL | 0 refills | Status: AC
  Filled 2021-10-21: qty 60, 30d supply, fill #0

## 2021-10-21 MED ORDER — COLCHICINE 0.6 MG PO TABS
0.6000 mg | ORAL_TABLET | Freq: Two times a day (BID) | ORAL | 0 refills | Status: DC
  Filled 2021-10-21: qty 20, 10d supply, fill #0

## 2021-10-21 MED ORDER — PREDNISONE 20 MG PO TABS: 40.0000 mg | ORAL_TABLET | Freq: Every day | ORAL | 0 refills | Status: DC

## 2021-10-21 MED ORDER — COLCHICINE 0.6 MG PO TABS: 0.6000 mg | ORAL_TABLET | Freq: Two times a day (BID) | ORAL | 0 refills | Status: DC

## 2021-10-21 MED ORDER — PREDNISONE 20 MG PO TABS
40.0000 mg | ORAL_TABLET | Freq: Every day | ORAL | 0 refills | Status: AC
  Filled 2021-10-21: qty 6, 3d supply, fill #0

## 2021-10-21 NOTE — Discharge Instructions (Addendum)
Mr. Jutte, you presented with abdominal pain and were found to have a blockage in small bowel. The blockage was medically managed and no surgery was required. You were able to tolerate food without any belly pain, nausea or vomiting. You had bowel movements as well.   In the hospital your left wrist started hurting, it appears you may have gout. We are giving you Prednisone 40 mg for 3 days as you have received it for 2 days in the hospital. We will also keep you on the colchicine 0.6 mg until your hospital follow up.   Your iron levels were low, you were given IV iron in the hospital, I would like for you to take oral iron supplement that you can find over the counter: Ferrous Sulfate 325 mg OTC.  Please follow up in the clinic at 10/31/21 at 2:15 pm.  It was a pleasure taking care of you!

## 2021-10-21 NOTE — Progress Notes (Signed)
1/20 Pt discharged, copy of original signed IM Letter mailed to patient's home address.

## 2021-10-21 NOTE — Care Management Important Message (Signed)
Important Message  Patient Details  Name: [name] MRN: [id number] Date of Birth: [birth year]   Medicare Important Message Given:  Other (see comment)     Hannah Beat 10/21/2021, 2:20 PM

## 2021-10-21 NOTE — Discharge Planning (Signed)
Patient discharged home in stable condition. Verbalizes understanding of all discharge instructions, including home medications and follow up appointments. 

## 2021-10-21 NOTE — Consult Note (Signed)
° °  [facility] CM Inpatient Consult   10/21/2021  [name] [birth year] [id number]  [place]  Accountable Care Organization [ACO] Patient:   Primary Care Provider:  [name], MD Is listed and this is an embedded provider with a Chronic Care Management team and program, and is listed for the transition of care follow up and appointments.  Patient was screened for Embedded practice service needs for chronic care management for high risk scores for unplanned readmission risk. Met with the patient at the bedside with information on follow up with PCP, and for potential follow up for chronic care management.  Plan: A referral is to be sent to the [facility] and made aware of TOC needs for post hospital needs.  Please contact for further questions,  Natividad Brood, RN BSN [facility] Liaison  [phone] business mobile phone Toll free office [phone]  Fax number: [phone] [name]

## 2021-10-21 NOTE — Telephone Encounter (Signed)
Dr Humphrey Rolls called requested a 7-10 day HFU for pt being discharged today was given 1/30  @ 2:15 with Dr Vinetta Bergamo

## 2021-10-31 ENCOUNTER — Encounter: Payer: Medicare Other | Admitting: Internal Medicine

## 2021-11-02 DIAGNOSIS — Z7689 Persons encountering health services in other specified circumstances: Secondary | ICD-10-CM | POA: Diagnosis not present

## 2021-11-07 ENCOUNTER — Telehealth: Payer: Self-pay | Admitting: Student

## 2021-11-07 DIAGNOSIS — Q642 Congenital posterior urethral valves: Secondary | ICD-10-CM | POA: Diagnosis not present

## 2021-11-07 MED ORDER — COLCHICINE 0.6 MG PO TABS: 0.6000 mg | ORAL_TABLET | Freq: Two times a day (BID) | ORAL | 0 refills | Status: DC

## 2021-11-07 NOTE — Telephone Encounter (Signed)
Refill Request   predniSONE (DELTASONE) 20 MG tablet   colchicine 0.6 MG tablet ([place], [place] (Ph: [phone])

## 2021-11-08 ENCOUNTER — Other Ambulatory Visit: Payer: Self-pay | Admitting: Student

## 2021-11-08 DIAGNOSIS — K219 Gastro-esophageal reflux disease without esophagitis: Secondary | ICD-10-CM

## 2021-11-15 ENCOUNTER — Encounter: Payer: Self-pay | Admitting: *Deleted

## 2021-11-17 DIAGNOSIS — N319 Neuromuscular dysfunction of bladder, unspecified: Secondary | ICD-10-CM | POA: Diagnosis not present

## 2021-11-17 DIAGNOSIS — Z933 Colostomy status: Secondary | ICD-10-CM | POA: Diagnosis not present

## 2021-11-17 DIAGNOSIS — L89154 Pressure ulcer of sacral region, stage 4: Secondary | ICD-10-CM | POA: Diagnosis not present

## 2021-11-17 DIAGNOSIS — L89314 Pressure ulcer of right buttock, stage 4: Secondary | ICD-10-CM | POA: Diagnosis not present

## 2021-11-19 DIAGNOSIS — Z7689 Persons encountering health services in other specified circumstances: Secondary | ICD-10-CM | POA: Diagnosis not present

## 2021-11-21 DIAGNOSIS — Z7689 Persons encountering health services in other specified circumstances: Secondary | ICD-10-CM | POA: Diagnosis not present

## 2021-11-22 DIAGNOSIS — Z7689 Persons encountering health services in other specified circumstances: Secondary | ICD-10-CM | POA: Diagnosis not present

## 2021-11-23 DIAGNOSIS — Z7689 Persons encountering health services in other specified circumstances: Secondary | ICD-10-CM | POA: Diagnosis not present

## 2021-11-24 DIAGNOSIS — Z7689 Persons encountering health services in other specified circumstances: Secondary | ICD-10-CM | POA: Diagnosis not present

## 2021-11-25 DIAGNOSIS — L89314 Pressure ulcer of right buttock, stage 4: Secondary | ICD-10-CM | POA: Diagnosis not present

## 2021-11-25 DIAGNOSIS — Z7689 Persons encountering health services in other specified circumstances: Secondary | ICD-10-CM | POA: Diagnosis not present

## 2021-11-25 DIAGNOSIS — L89154 Pressure ulcer of sacral region, stage 4: Secondary | ICD-10-CM | POA: Diagnosis not present

## 2021-11-26 DIAGNOSIS — Z7689 Persons encountering health services in other specified circumstances: Secondary | ICD-10-CM | POA: Diagnosis not present

## 2021-11-28 DIAGNOSIS — Z7689 Persons encountering health services in other specified circumstances: Secondary | ICD-10-CM | POA: Diagnosis not present

## 2021-11-29 DIAGNOSIS — Z7689 Persons encountering health services in other specified circumstances: Secondary | ICD-10-CM | POA: Diagnosis not present

## 2021-11-30 DIAGNOSIS — Z7689 Persons encountering health services in other specified circumstances: Secondary | ICD-10-CM | POA: Diagnosis not present

## 2021-12-01 DIAGNOSIS — Z7689 Persons encountering health services in other specified circumstances: Secondary | ICD-10-CM | POA: Diagnosis not present

## 2021-12-02 DIAGNOSIS — Z7689 Persons encountering health services in other specified circumstances: Secondary | ICD-10-CM | POA: Diagnosis not present

## 2021-12-03 DIAGNOSIS — Z7689 Persons encountering health services in other specified circumstances: Secondary | ICD-10-CM | POA: Diagnosis not present

## 2021-12-05 DIAGNOSIS — Z7689 Persons encountering health services in other specified circumstances: Secondary | ICD-10-CM | POA: Diagnosis not present

## 2021-12-06 ENCOUNTER — Encounter: Payer: Medicare Other | Admitting: Student

## 2021-12-06 DIAGNOSIS — Z7689 Persons encountering health services in other specified circumstances: Secondary | ICD-10-CM | POA: Diagnosis not present

## 2021-12-07 DIAGNOSIS — Z7689 Persons encountering health services in other specified circumstances: Secondary | ICD-10-CM | POA: Diagnosis not present

## 2021-12-08 ENCOUNTER — Encounter: Payer: Self-pay | Admitting: Student

## 2021-12-08 DIAGNOSIS — Z7689 Persons encountering health services in other specified circumstances: Secondary | ICD-10-CM | POA: Diagnosis not present

## 2021-12-09 DIAGNOSIS — Z7689 Persons encountering health services in other specified circumstances: Secondary | ICD-10-CM | POA: Diagnosis not present

## 2021-12-09 DIAGNOSIS — Q549 Hypospadias, unspecified: Secondary | ICD-10-CM | POA: Insufficient documentation

## 2021-12-10 DIAGNOSIS — Z7689 Persons encountering health services in other specified circumstances: Secondary | ICD-10-CM | POA: Diagnosis not present

## 2021-12-12 DIAGNOSIS — Z7689 Persons encountering health services in other specified circumstances: Secondary | ICD-10-CM | POA: Diagnosis not present

## 2021-12-13 DIAGNOSIS — Z7689 Persons encountering health services in other specified circumstances: Secondary | ICD-10-CM | POA: Diagnosis not present

## 2021-12-14 DIAGNOSIS — Z7689 Persons encountering health services in other specified circumstances: Secondary | ICD-10-CM | POA: Diagnosis not present

## 2021-12-15 DIAGNOSIS — L89154 Pressure ulcer of sacral region, stage 4: Secondary | ICD-10-CM | POA: Diagnosis not present

## 2021-12-15 DIAGNOSIS — L89314 Pressure ulcer of right buttock, stage 4: Secondary | ICD-10-CM | POA: Diagnosis not present

## 2021-12-15 DIAGNOSIS — Z933 Colostomy status: Secondary | ICD-10-CM | POA: Diagnosis not present

## 2021-12-15 DIAGNOSIS — Z7689 Persons encountering health services in other specified circumstances: Secondary | ICD-10-CM | POA: Diagnosis not present

## 2021-12-15 DIAGNOSIS — N319 Neuromuscular dysfunction of bladder, unspecified: Secondary | ICD-10-CM | POA: Diagnosis not present

## 2021-12-16 DIAGNOSIS — Z7689 Persons encountering health services in other specified circumstances: Secondary | ICD-10-CM | POA: Diagnosis not present

## 2021-12-17 DIAGNOSIS — Z7689 Persons encountering health services in other specified circumstances: Secondary | ICD-10-CM | POA: Diagnosis not present

## 2021-12-19 DIAGNOSIS — Z7689 Persons encountering health services in other specified circumstances: Secondary | ICD-10-CM | POA: Diagnosis not present

## 2021-12-20 ENCOUNTER — Inpatient Hospital Stay (HOSPITAL_COMMUNITY)
Admission: EM | Admit: 2021-12-20 | Discharge: 2021-12-22 | DRG: 640 | Disposition: A | Discharge status: Home / Self Care | Payer: Medicare Other | Attending: Internal Medicine | Admitting: Internal Medicine

## 2021-12-20 ENCOUNTER — Emergency Department (HOSPITAL_COMMUNITY): Payer: Medicare Other

## 2021-12-20 ENCOUNTER — Other Ambulatory Visit: Payer: Self-pay

## 2021-12-20 DIAGNOSIS — Z87891 Personal history of nicotine dependence: Secondary | ICD-10-CM

## 2021-12-20 DIAGNOSIS — T502X5A Adverse effect of carbonic-anhydrase inhibitors, benzothiadiazides and other diuretics, initial encounter: Secondary | ICD-10-CM | POA: Diagnosis not present

## 2021-12-20 DIAGNOSIS — L89154 Pressure ulcer of sacral region, stage 4: Secondary | ICD-10-CM | POA: Diagnosis present

## 2021-12-20 DIAGNOSIS — G8220 Paraplegia, unspecified: Secondary | ICD-10-CM | POA: Diagnosis not present

## 2021-12-20 DIAGNOSIS — N390 Urinary tract infection, site not specified: Secondary | ICD-10-CM | POA: Diagnosis not present

## 2021-12-20 DIAGNOSIS — D509 Iron deficiency anemia, unspecified: Secondary | ICD-10-CM | POA: Diagnosis present

## 2021-12-20 DIAGNOSIS — Z6841 Body Mass Index (BMI) 40.0 and over, adult: Secondary | ICD-10-CM

## 2021-12-20 DIAGNOSIS — L853 Xerosis cutis: Secondary | ICD-10-CM | POA: Diagnosis present

## 2021-12-20 DIAGNOSIS — Z841 Family history of disorders of kidney and ureter: Secondary | ICD-10-CM | POA: Diagnosis not present

## 2021-12-20 DIAGNOSIS — F1110 Opioid abuse, uncomplicated: Secondary | ICD-10-CM | POA: Diagnosis present

## 2021-12-20 DIAGNOSIS — M109 Gout, unspecified: Secondary | ICD-10-CM | POA: Diagnosis present

## 2021-12-20 DIAGNOSIS — T83511A Infection and inflammatory reaction due to indwelling urethral catheter, initial encounter: Secondary | ICD-10-CM | POA: Diagnosis present

## 2021-12-20 DIAGNOSIS — Z7689 Persons encountering health services in other specified circumstances: Secondary | ICD-10-CM | POA: Diagnosis not present

## 2021-12-20 DIAGNOSIS — E872 Acidosis, unspecified: Secondary | ICD-10-CM | POA: Diagnosis not present

## 2021-12-20 DIAGNOSIS — E860 Dehydration: Secondary | ICD-10-CM | POA: Diagnosis not present

## 2021-12-20 DIAGNOSIS — E8809 Other disorders of plasma-protein metabolism, not elsewhere classified: Secondary | ICD-10-CM | POA: Diagnosis not present

## 2021-12-20 DIAGNOSIS — K219 Gastro-esophageal reflux disease without esophagitis: Secondary | ICD-10-CM | POA: Diagnosis not present

## 2021-12-20 DIAGNOSIS — R0902 Hypoxemia: Secondary | ICD-10-CM | POA: Diagnosis not present

## 2021-12-20 DIAGNOSIS — L8944 Pressure ulcer of contiguous site of back, buttock and hip, stage 4: Secondary | ICD-10-CM

## 2021-12-20 DIAGNOSIS — N179 Acute kidney failure, unspecified: Secondary | ICD-10-CM | POA: Diagnosis not present

## 2021-12-20 DIAGNOSIS — Z933 Colostomy status: Secondary | ICD-10-CM | POA: Diagnosis not present

## 2021-12-20 DIAGNOSIS — L98499 Non-pressure chronic ulcer of skin of other sites with unspecified severity: Secondary | ICD-10-CM | POA: Diagnosis present

## 2021-12-20 DIAGNOSIS — L89893 Pressure ulcer of other site, stage 3: Secondary | ICD-10-CM | POA: Diagnosis present

## 2021-12-20 DIAGNOSIS — B372 Candidiasis of skin and nail: Secondary | ICD-10-CM | POA: Diagnosis present

## 2021-12-20 DIAGNOSIS — Y929 Unspecified place or not applicable: Secondary | ICD-10-CM

## 2021-12-20 DIAGNOSIS — Z91014 Allergy to mammalian meats: Secondary | ICD-10-CM

## 2021-12-20 DIAGNOSIS — L304 Erythema intertrigo: Secondary | ICD-10-CM | POA: Diagnosis present

## 2021-12-20 DIAGNOSIS — Z978 Presence of other specified devices: Secondary | ICD-10-CM | POA: Diagnosis not present

## 2021-12-20 DIAGNOSIS — Z833 Family history of diabetes mellitus: Secondary | ICD-10-CM | POA: Diagnosis not present

## 2021-12-20 DIAGNOSIS — N3001 Acute cystitis with hematuria: Secondary | ICD-10-CM | POA: Diagnosis not present

## 2021-12-20 DIAGNOSIS — Z79899 Other long term (current) drug therapy: Secondary | ICD-10-CM

## 2021-12-20 DIAGNOSIS — I10 Essential (primary) hypertension: Secondary | ICD-10-CM | POA: Diagnosis not present

## 2021-12-20 DIAGNOSIS — G8929 Other chronic pain: Secondary | ICD-10-CM | POA: Diagnosis present

## 2021-12-20 DIAGNOSIS — Z809 Family history of malignant neoplasm, unspecified: Secondary | ICD-10-CM | POA: Diagnosis not present

## 2021-12-20 DIAGNOSIS — G4733 Obstructive sleep apnea (adult) (pediatric): Secondary | ICD-10-CM | POA: Diagnosis not present

## 2021-12-20 DIAGNOSIS — Z7982 Long term (current) use of aspirin: Secondary | ICD-10-CM

## 2021-12-20 DIAGNOSIS — Y738 Miscellaneous gastroenterology and urology devices associated with adverse incidents, not elsewhere classified: Secondary | ICD-10-CM | POA: Diagnosis present

## 2021-12-20 DIAGNOSIS — R109 Unspecified abdominal pain: Secondary | ICD-10-CM | POA: Diagnosis not present

## 2021-12-20 DIAGNOSIS — R7303 Prediabetes: Secondary | ICD-10-CM | POA: Diagnosis present

## 2021-12-20 DIAGNOSIS — R319 Hematuria, unspecified: Secondary | ICD-10-CM | POA: Diagnosis not present

## 2021-12-20 DIAGNOSIS — Z743 Need for continuous supervision: Secondary | ICD-10-CM | POA: Diagnosis not present

## 2021-12-20 DIAGNOSIS — L98491 Non-pressure chronic ulcer of skin of other sites limited to breakdown of skin: Secondary | ICD-10-CM | POA: Diagnosis not present

## 2021-12-20 DIAGNOSIS — R7989 Other specified abnormal findings of blood chemistry: Secondary | ICD-10-CM

## 2021-12-20 LAB — MAGNESIUM: Magnesium: 1.3 mg/dL — ABNORMAL LOW (ref 1.7–2.4)

## 2021-12-20 LAB — COMPREHENSIVE METABOLIC PANEL
ALT: 20 U/L (ref 0–44)
AST: 21 U/L (ref 15–41)
Albumin: 2.8 g/dL — ABNORMAL LOW (ref 3.5–5.0)
Alkaline Phosphatase: 72 U/L (ref 38–126)
Anion gap: 15 (ref 5–15)
BUN: 107 mg/dL — ABNORMAL HIGH (ref 6–20)
CO2: 15 mmol/L — ABNORMAL LOW (ref 22–32)
Calcium: 9 mg/dL (ref 8.9–10.3)
Chloride: 109 mmol/L (ref 98–111)
Creatinine, Ser: 5.49 mg/dL — ABNORMAL HIGH (ref 0.61–1.24)
GFR, Estimated: 11 mL/min — ABNORMAL LOW (ref >60)
Glucose, Bld: 108 mg/dL — ABNORMAL HIGH (ref 70–99)
Potassium: 4.6 mmol/L (ref 3.5–5.1)
Sodium: 139 mmol/L (ref 135–145)
Total Bilirubin: 0.5 mg/dL (ref 0.3–1.2)
Total Protein: 7.8 g/dL (ref 6.5–8.1)

## 2021-12-20 LAB — CBC WITH DIFFERENTIAL/PLATELET
Abs Immature Granulocytes: 0.02 10*3/uL (ref 0.00–0.07)
Basophils Absolute: 0 10*3/uL (ref 0.0–0.1)
Basophils Relative: 0 %
Eosinophils Absolute: 0.1 10*3/uL (ref 0.0–0.5)
Eosinophils Relative: 1 %
HCT: 28.8 % — ABNORMAL LOW (ref 39.0–52.0)
Hemoglobin: 8.4 g/dL — ABNORMAL LOW (ref 13.0–17.0)
Immature Granulocytes: 0 %
Lymphocytes Relative: 20 %
Lymphs Abs: 2 10*3/uL (ref 0.7–4.0)
MCH: 26.7 pg (ref 26.0–34.0)
MCHC: 29.2 g/dL — ABNORMAL LOW (ref 30.0–36.0)
MCV: 91.4 fL (ref 80.0–100.0)
Monocytes Absolute: 1.1 10*3/uL — ABNORMAL HIGH (ref 0.1–1.0)
Monocytes Relative: 11 %
Neutro Abs: 6.7 10*3/uL (ref 1.7–7.7)
Neutrophils Relative %: 68 %
Platelets: 352 10*3/uL (ref 150–400)
RBC: 3.15 MIL/uL — ABNORMAL LOW (ref 4.22–5.81)
RDW: 15.3 % (ref 11.5–15.5)
WBC: 10 10*3/uL (ref 4.0–10.5)
nRBC: 0 % (ref 0.0–0.2)

## 2021-12-20 LAB — URINALYSIS, ROUTINE W REFLEX MICROSCOPIC
Bilirubin Urine: NEGATIVE
Glucose, UA: NEGATIVE mg/dL
Ketones, ur: NEGATIVE mg/dL
Nitrite: NEGATIVE
Protein, ur: 100 mg/dL — AB
Specific Gravity, Urine: 1.013 (ref 1.005–1.030)
pH: 5 (ref 5.0–8.0)

## 2021-12-20 LAB — CK: Total CK: 58 U/L (ref 49–397)

## 2021-12-20 MED ORDER — POLYETHYLENE GLYCOL 3350 17 G PO PACK: 17.0000 g | PACK | Freq: Every day | ORAL | Status: DC | PRN

## 2021-12-20 MED ORDER — LACTATED RINGERS IV SOLN: INTRAVENOUS | Status: AC

## 2021-12-20 MED ORDER — ACETAMINOPHEN 325 MG PO TABS
650.0000 mg | ORAL_TABLET | Freq: Four times a day (QID) | ORAL | Status: DC | PRN
Start: 2021-12-20 — End: 2021-12-22
  Administered 2021-12-20: 650 mg via ORAL
  Filled 2021-12-20: qty 2

## 2021-12-20 MED ORDER — HEPARIN SODIUM (PORCINE) 5000 UNIT/ML IJ SOLN
5000.0000 [IU] | Freq: Three times a day (TID) | INTRAMUSCULAR | Status: DC
  Administered 2021-12-20 – 2021-12-22 (×6): 5000 [IU] via SUBCUTANEOUS
  Filled 2021-12-20 (×5): qty 1

## 2021-12-20 MED ORDER — LACTATED RINGERS IV BOLUS
500.0000 mL | Freq: Once | INTRAVENOUS | Status: AC
  Administered 2021-12-20: 500 mL via INTRAVENOUS

## 2021-12-20 MED ORDER — SODIUM CHLORIDE 0.9 % IV SOLN
1.0000 g | INTRAVENOUS | Status: DC
  Administered 2021-12-20 – 2021-12-21 (×2): 1 g via INTRAVENOUS
  Filled 2021-12-20: qty 10

## 2021-12-20 MED ORDER — ACETAMINOPHEN 650 MG RE SUPP
650.0000 mg | Freq: Four times a day (QID) | RECTAL | Status: DC | PRN
Start: 2021-12-20 — End: 2021-12-22

## 2021-12-20 MED ORDER — METHADONE HCL 10 MG PO TABS
50.0000 mg | ORAL_TABLET | Freq: Every day | ORAL | Status: DC
  Administered 2021-12-20 – 2021-12-22 (×3): 50 mg via ORAL
  Filled 2021-12-20 (×3): qty 5

## 2021-12-20 MED ORDER — MAGNESIUM SULFATE IN D5W 1-5 GM/100ML-% IV SOLN
1.0000 g | Freq: Once | INTRAVENOUS | Status: AC
Start: 2021-12-20 — End: 2021-12-20
  Administered 2021-12-20: 1 g via INTRAVENOUS
  Filled 2021-12-20: qty 100

## 2021-12-20 MED ORDER — ASPIRIN EC 81 MG PO TBEC
81.0000 mg | DELAYED_RELEASE_TABLET | Freq: Every morning | ORAL | Status: DC
  Administered 2021-12-21 – 2021-12-22 (×2): 81 mg via ORAL
  Filled 2021-12-20 (×2): qty 1

## 2021-12-20 MED ORDER — FAMOTIDINE 20 MG PO TABS
40.0000 mg | ORAL_TABLET | Freq: Every day | ORAL | Status: DC
  Administered 2021-12-20 – 2021-12-21 (×2): 40 mg via ORAL
  Filled 2021-12-20 (×2): qty 2

## 2021-12-20 NOTE — ED Notes (Signed)
Got patient undressed into a on the monitor got patient a warm blanket patient is resting with call bell in reach ?

## 2021-12-20 NOTE — Hospital Course (Addendum)
#  AKI, stage 3 ?#Hypomagnesemia 2/2 diuretic use ?#Dehydration ?Creatinine was 5.49 on admission. PXT:GGYIRSWNIO ratio is 19.5. Etiology of AKI likely a combination of prerenal in the setting of dehydration from poor po intake the past week and intrinsic injury in the setting of the patient taking colchicine and ibuprofen Patient received IV fluids and Mg 1g IV in the ED. No hydronephrosis was seen on CT abd/pelvis. Creatinine downtrended to 3.95 on hospital day 1 and patient was continued on IV fluids. Mg level was 1.5 and patient was given IV Mg. Creatinine continued to appropriately downtrend and was 2.36 on day of discharge. Patient discharged with instructions for close outpatient follow-up. Recommend BMP be checked at hospital follow up.  ?  ?#UTI in the setting of indwelling foley catheter ?Large leukocytes and 21-50 WBCs with bacteria seen on urinalysis. Previous urine culture in November 2022 positive for Klebsiella Oxytoca. Ceftriaxone chosen as empiric treatment based on past urine culture sensitivities and started on 3/21. Foley with clear yellow urine. Patient with no leukocytosis and remains afebrile. Patient received last dose of ceftriaxone on 3/23. Urinary urgency improved.  ?  ?#Decubitus ulcers ?#Paraplegia ?#Intertrigo ?Patient has multiple decubitus ulcers adjacent to the right lower sacrum, right ischial tuberosity, and proximal right femur. History of recurrent decubitus ulcers due to paraplegia. Wound care consulted. Due to large region of fungal infection surrounding ulcers, patient was started on oral fluconazole. Will continue course of fluconazole weekly for 3 more doses. Also discharged with topical clotrimazole to apply to abdominal folders. ?  ?#Iron deficiency anemia ?Hgb 8.4 on admission and stable throughout hospitalization. Patient asymptomatic during hospitalization. History of iron deficiency anemia. Vit B12 wnl. Decreased iron, TIBC, and Saturation ratio. Did not start iron  supplementation during hospitalization due to ongoing infections. Recommend restarting oral iron supplementation at outpatient follow-up. ? ? ?
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
## 2021-12-20 NOTE — ED Notes (Signed)
Medications are unverified by pharmacy at the moment  ?

## 2021-12-20 NOTE — ED Triage Notes (Signed)
Pt BIB EMS due to dysuria/hematuria. Pt tried to call PCP over the weekend and could not get ahold of them. Pt is paraplegic and has a colostomy and foley. Pt is axox4. VSS.  ?

## 2021-12-20 NOTE — ED Provider Notes (Signed)
?[facility] ?Provider Note ? ? ?CSN: [id number] ?Arrival date & time: 12/20/21  1032 ? ?  ? ?History ? ?Chief Complaint  ?Patient presents with  ? Dysuria  ? ? ?[name] is a 56 y.o. male. ? ? ?Dysuria ?Presenting symptoms: dysuria   ?Associated symptoms: abdominal pain   ?Patient presents for multiple complaints.  His medical history includes paraplegia, chronic pain, HTN, stage IV sacral ulcer, polysubstance abuse, OSA, anemia, GERD, indwelling Foley catheter.  Due to his nonhealing sacral ulcer, ileostomy was placed.  He was hospitalized in January for an SBO.  Patient currently reports the following: He has had intermittent symptoms of a pulling sensation in the area of his ostomy bag.  These seem to occur when he leans forward and then leans back.  The symptoms have been present for the past several days and he feels that these are worsened.  He states that he is continues to have ostomy output.  He changed his bag today.  Additionally, he does report a burning sensation/dysuria with his urination.  Patient has a Foley catheter in place but states that he does continue to have sensation in urethral area.  He reports discomfort with urination over the past week. ?  ? ?Home Medications ?Prior to Admission medications   ?Medication Sig Start Date End Date Taking? Authorizing Provider  ?METHADONE HCL PO Take 50 mg by mouth daily.   Yes [provider]  ?Ascorbic Acid (VITAMIN C PO) Take 1 tablet by mouth every morning.    [provider]  ?aspirin EC 81 MG tablet Take 81 mg by mouth every morning. Swallow whole.    [provider]  ?Blood Pressure Monitor DEVI Please provide patient with insurance approved blood pressure monitor. ICD10 I.10 12/27/20   Gildardo Pounds, NP  ?Calcium Carbonate-Vitamin D (CALCIUM-D PO) Take 1 tablet by mouth every morning.    [provider]  ?colchicine 0.6 MG tablet Take 1 tablet (0.6 mg total) by mouth  2 (two) times daily for 10 days. 11/07/21 11/17/21  [name], MD  ?diclofenac Sodium (VOLTAREN) 1 % GEL Apply 1 application topically 2 (two) times daily.    [provider]  ?famotidine (PEPCID) 40 MG tablet TAKE 1 TABLET (40 MG TOTAL) BY MOUTH IN THE MORNING AND AT BEDTIME. 11/09/21 12/09/21  [name], MD  ?liver oil-zinc oxide (DESITIN) 40 % ointment Apply 1 application topically daily.    [provider]  ?lubiprostone (AMITIZA) 24 MCG capsule TAKE 1 CAPSULE (24 MCG TOTAL) BY MOUTH 2 (TWO) TIMES DAILY WITH A MEAL. ?Patient not taking: Reported on 10/14/2021 09/02/21   Loralie Champagne, PA-C  ?Olmesartan-amLODIPine-HCTZ 40-10-25 MG TABS Take 1 tablet by mouth daily. 02/24/21 12/01/21  Riesa Pope, MD  ?OVER THE COUNTER MEDICATION Take 3 capsules by mouth every morning. Omega XL    [provider]  ?Peppermint Oil (IBGARD PO) Take 2 capsules by mouth at bedtime.    [provider]  ?polyethylene glycol (MIRALAX / GLYCOLAX) 17 g packet Take 17 g by mouth every morning.    [provider]  ?Simethicone (GAS-X PO) Take 2 tablets by mouth at bedtime. chewable    [provider]  ?   ? ?Allergies    ?Pork-derived products   ? ?Review of Systems   ?Review of Systems  ?Gastrointestinal:  Positive for abdominal pain.  ?Genitourinary:  Positive for dysuria.  ?Skin:  Positive for wound.  ?All other systems reviewed  and are negative. ? ?Physical Exam ?Updated Vital Signs ?BP 120/84 (BP Location: Left Arm)   Pulse 75   Temp 98.7 ?F (37.1 ?C) (Oral)   Resp 18   Ht 6' (1.829 m)   SpO2 98%   BMI 44.21 kg/m?  ?Physical Exam ?Vitals and nursing note reviewed. Exam conducted with a chaperone present.  ?Constitutional:   ?   General: He is not in acute distress. ?   Appearance: He is well-developed. He is obese. He is ill-appearing (Chronically). He is not toxic-appearing or diaphoretic.  ?HENT:  ?   Head: Normocephalic and atraumatic.  ?   Right Ear:  External ear normal.  ?   Left Ear: External ear normal.  ?   Nose: Nose normal.  ?   Mouth/Throat:  ?   Mouth: Mucous membranes are moist.  ?Eyes:  ?   Extraocular Movements: Extraocular movements intact.  ?   Conjunctiva/sclera: Conjunctivae normal.  ?   Comments: Right prosthetic eye  ?Cardiovascular:  ?   Rate and Rhythm: Normal rate and regular rhythm.  ?   Heart sounds: No murmur heard. ?Pulmonary:  ?   Effort: Pulmonary effort is normal. No respiratory distress.  ?Abdominal:  ?   Palpations: Abdomen is soft.  ?   Tenderness: There is no abdominal tenderness.  ?   Comments: Ostomy bag in left lower quadrant.  No surrounding erythema or tenderness.  ?Genitourinary: ?   Comments: Baseline hypospadias, caused by prolonged pressure injury from Foley catheter 1 year ago. ?Musculoskeletal:     ?   General: No swelling.  ?   Cervical back: Normal range of motion and neck supple. No rigidity.  ?   Right lower leg: No edema.  ?   Left lower leg: No edema.  ?Skin: ?   General: Skin is warm and dry.  ?   Capillary Refill: Capillary refill takes less than 2 seconds.  ?   Comments: Diffuse decubitus ulcers in area of buttocks and sacrum.  No purulent drainage identified.  ?Neurological:  ?   Mental Status: He is alert. Mental status is at baseline.  ?   Comments: Paraplegia.  Patient reports intact sensation in his lower extremities.  He has very little motor strength in his legs.  Strength is greater in the left.  These conditions are his baseline.  ?Psychiatric:     ?   Mood and Affect: Mood normal.     ?   Behavior: Behavior normal.  ? ? ?ED Results / Procedures / Treatments   ?Labs ?(all labs ordered are listed, but only abnormal results are displayed) ?Labs Reviewed  ?CBC WITH DIFFERENTIAL/PLATELET - Abnormal; Notable for the following components:  ?    Result Value  ? RBC 3.15 (*)   ? Hemoglobin 8.4 (*)   ? HCT 28.8 (*)   ? MCHC 29.2 (*)   ? Monocytes Absolute 1.1 (*)   ? All other components within normal limits   ?COMPREHENSIVE METABOLIC PANEL - Abnormal; Notable for the following components:  ? CO2 15 (*)   ? Glucose, Bld 108 (*)   ? BUN 107 (*)   ? Creatinine, Ser 5.49 (*)   ? Albumin 2.8 (*)   ? GFR, Estimated 11 (*)   ? All other components within normal limits  ?MAGNESIUM - Abnormal; Notable for the following components:  ? Magnesium 1.3 (*)   ? All other components within normal limits  ?URINALYSIS, ROUTINE W REFLEX MICROSCOPIC - Abnormal; Notable  for the following components:  ? APPearance HAZY (*)   ? Hgb urine dipstick MODERATE (*)   ? Protein, ur 100 (*)   ? Leukocytes,Ua LARGE (*)   ? Bacteria, UA FEW (*)   ? All other components within normal limits  ?COMPREHENSIVE METABOLIC PANEL - Abnormal; Notable for the following components:  ? CO2 19 (*)   ? BUN 88 (*)   ? Creatinine, Ser 3.95 (*)   ? Albumin 2.5 (*)   ? GFR, Estimated 17 (*)   ? All other components within normal limits  ?CBC - Abnormal; Notable for the following components:  ? RBC 3.07 (*)   ? Hemoglobin 8.3 (*)   ? HCT 27.0 (*)   ? All other components within normal limits  ?MAGNESIUM - Abnormal; Notable for the following components:  ? Magnesium 1.5 (*)   ? All other components within normal limits  ?URINE CULTURE  ?CULTURE, BLOOD (ROUTINE X 2)  ?CULTURE, BLOOD (ROUTINE X 2)  ?CK  ? ? ?EKG ?None ? ?Radiology ?CT ABDOMEN PELVIS WO CONTRAST ? ?Result Date: 12/20/2021 ?CLINICAL DATA:  Abdominal pain, dysuria, hematuria EXAM: CT ABDOMEN AND PELVIS WITHOUT CONTRAST TECHNIQUE: Multidetector CT imaging of the abdomen and pelvis was performed following the standard protocol without IV contrast. RADIATION DOSE REDUCTION: This exam was performed according to the departmental dose-optimization program which includes automated exposure control, adjustment of the mA and/or kV according to patient size and/or use of iterative reconstruction technique. COMPARISON:  10/13/2021 FINDINGS: Lower chest: Unremarkable. Hepatobiliary: No focal abnormality is seen in the  liver. There is no dilation of bile ducts. Gallbladder is unremarkable. Pancreas: No focal abnormality is seen. Spleen: Unremarkable. Adrenals/Urinary Tract: There is nodularity in the right adrenal with no significant

## 2021-12-20 NOTE — ED Notes (Signed)
RN recollected urine  ?

## 2021-12-21 DIAGNOSIS — Z933 Colostomy status: Secondary | ICD-10-CM | POA: Diagnosis not present

## 2021-12-21 DIAGNOSIS — N179 Acute kidney failure, unspecified: Secondary | ICD-10-CM | POA: Diagnosis not present

## 2021-12-21 DIAGNOSIS — Z833 Family history of diabetes mellitus: Secondary | ICD-10-CM | POA: Diagnosis not present

## 2021-12-21 DIAGNOSIS — Y929 Unspecified place or not applicable: Secondary | ICD-10-CM | POA: Diagnosis not present

## 2021-12-21 DIAGNOSIS — L89154 Pressure ulcer of sacral region, stage 4: Secondary | ICD-10-CM | POA: Diagnosis not present

## 2021-12-21 DIAGNOSIS — M109 Gout, unspecified: Secondary | ICD-10-CM | POA: Diagnosis present

## 2021-12-21 DIAGNOSIS — N390 Urinary tract infection, site not specified: Secondary | ICD-10-CM | POA: Diagnosis present

## 2021-12-21 DIAGNOSIS — G4733 Obstructive sleep apnea (adult) (pediatric): Secondary | ICD-10-CM | POA: Diagnosis present

## 2021-12-21 DIAGNOSIS — E872 Acidosis, unspecified: Secondary | ICD-10-CM | POA: Diagnosis not present

## 2021-12-21 DIAGNOSIS — D509 Iron deficiency anemia, unspecified: Secondary | ICD-10-CM | POA: Diagnosis not present

## 2021-12-21 DIAGNOSIS — Z7689 Persons encountering health services in other specified circumstances: Secondary | ICD-10-CM | POA: Diagnosis not present

## 2021-12-21 DIAGNOSIS — Z809 Family history of malignant neoplasm, unspecified: Secondary | ICD-10-CM | POA: Diagnosis not present

## 2021-12-21 DIAGNOSIS — F1110 Opioid abuse, uncomplicated: Secondary | ICD-10-CM | POA: Diagnosis not present

## 2021-12-21 DIAGNOSIS — L98491 Non-pressure chronic ulcer of skin of other sites limited to breakdown of skin: Secondary | ICD-10-CM | POA: Diagnosis not present

## 2021-12-21 DIAGNOSIS — N3001 Acute cystitis with hematuria: Secondary | ICD-10-CM | POA: Diagnosis not present

## 2021-12-21 DIAGNOSIS — E860 Dehydration: Secondary | ICD-10-CM | POA: Diagnosis not present

## 2021-12-21 DIAGNOSIS — T83511A Infection and inflammatory reaction due to indwelling urethral catheter, initial encounter: Secondary | ICD-10-CM | POA: Diagnosis present

## 2021-12-21 DIAGNOSIS — L8944 Pressure ulcer of contiguous site of back, buttock and hip, stage 4: Secondary | ICD-10-CM

## 2021-12-21 DIAGNOSIS — G8220 Paraplegia, unspecified: Secondary | ICD-10-CM | POA: Diagnosis not present

## 2021-12-21 DIAGNOSIS — K219 Gastro-esophageal reflux disease without esophagitis: Secondary | ICD-10-CM | POA: Diagnosis not present

## 2021-12-21 DIAGNOSIS — Z6841 Body Mass Index (BMI) 40.0 and over, adult: Secondary | ICD-10-CM | POA: Diagnosis not present

## 2021-12-21 DIAGNOSIS — B372 Candidiasis of skin and nail: Secondary | ICD-10-CM | POA: Diagnosis not present

## 2021-12-21 DIAGNOSIS — Y738 Miscellaneous gastroenterology and urology devices associated with adverse incidents, not elsewhere classified: Secondary | ICD-10-CM | POA: Diagnosis present

## 2021-12-21 DIAGNOSIS — L89893 Pressure ulcer of other site, stage 3: Secondary | ICD-10-CM | POA: Diagnosis not present

## 2021-12-21 DIAGNOSIS — I10 Essential (primary) hypertension: Secondary | ICD-10-CM | POA: Diagnosis not present

## 2021-12-21 DIAGNOSIS — L853 Xerosis cutis: Secondary | ICD-10-CM | POA: Diagnosis present

## 2021-12-21 DIAGNOSIS — Z841 Family history of disorders of kidney and ureter: Secondary | ICD-10-CM | POA: Diagnosis not present

## 2021-12-21 DIAGNOSIS — Z7401 Bed confinement status: Secondary | ICD-10-CM | POA: Diagnosis not present

## 2021-12-21 DIAGNOSIS — E8809 Other disorders of plasma-protein metabolism, not elsewhere classified: Secondary | ICD-10-CM | POA: Diagnosis not present

## 2021-12-21 DIAGNOSIS — Z87891 Personal history of nicotine dependence: Secondary | ICD-10-CM | POA: Diagnosis not present

## 2021-12-21 DIAGNOSIS — Z978 Presence of other specified devices: Secondary | ICD-10-CM | POA: Diagnosis not present

## 2021-12-21 DIAGNOSIS — R6889 Other general symptoms and signs: Secondary | ICD-10-CM | POA: Diagnosis not present

## 2021-12-21 LAB — COMPREHENSIVE METABOLIC PANEL
ALT: 21 U/L (ref 0–44)
AST: 18 U/L (ref 15–41)
Albumin: 2.5 g/dL — ABNORMAL LOW (ref 3.5–5.0)
Alkaline Phosphatase: 70 U/L (ref 38–126)
Anion gap: 10 (ref 5–15)
BUN: 88 mg/dL — ABNORMAL HIGH (ref 6–20)
CO2: 19 mmol/L — ABNORMAL LOW (ref 22–32)
Calcium: 8.9 mg/dL (ref 8.9–10.3)
Chloride: 111 mmol/L (ref 98–111)
Creatinine, Ser: 3.95 mg/dL — ABNORMAL HIGH (ref 0.61–1.24)
GFR, Estimated: 17 mL/min — ABNORMAL LOW (ref >60)
Glucose, Bld: 87 mg/dL (ref 70–99)
Potassium: 4.7 mmol/L (ref 3.5–5.1)
Sodium: 140 mmol/L (ref 135–145)
Total Bilirubin: 0.4 mg/dL (ref 0.3–1.2)
Total Protein: 7.3 g/dL (ref 6.5–8.1)

## 2021-12-21 LAB — CBC
HCT: 27 % — ABNORMAL LOW (ref 39.0–52.0)
Hemoglobin: 8.3 g/dL — ABNORMAL LOW (ref 13.0–17.0)
MCH: 27 pg (ref 26.0–34.0)
MCHC: 30.7 g/dL (ref 30.0–36.0)
MCV: 87.9 fL (ref 80.0–100.0)
Platelets: 337 10*3/uL (ref 150–400)
RBC: 3.07 MIL/uL — ABNORMAL LOW (ref 4.22–5.81)
RDW: 15.2 % (ref 11.5–15.5)
WBC: 9.4 10*3/uL (ref 4.0–10.5)
nRBC: 0 % (ref 0.0–0.2)

## 2021-12-21 LAB — FERRITIN: Ferritin: 289 ng/mL (ref 24–336)

## 2021-12-21 LAB — URINE CULTURE

## 2021-12-21 LAB — MAGNESIUM: Magnesium: 1.5 mg/dL — ABNORMAL LOW (ref 1.7–2.4)

## 2021-12-21 LAB — IRON AND TIBC
Iron: 34 ug/dL — ABNORMAL LOW (ref 45–182)
Saturation Ratios: 17 % — ABNORMAL LOW (ref 17.9–39.5)
TIBC: 204 ug/dL — ABNORMAL LOW (ref 250–450)
UIBC: 170 ug/dL

## 2021-12-21 LAB — VITAMIN B12: Vitamin B-12: 310 pg/mL (ref 180–914)

## 2021-12-21 MED ORDER — MAGNESIUM SULFATE 2 GM/50ML IV SOLN
2.0000 g | Freq: Once | INTRAVENOUS | Status: AC
  Administered 2021-12-21: 2 g via INTRAVENOUS
  Filled 2021-12-21: qty 50

## 2021-12-21 MED ORDER — CLOTRIMAZOLE 1 % EX CREA
TOPICAL_CREAM | Freq: Two times a day (BID) | CUTANEOUS | Status: DC
Start: 2021-12-21 — End: 2021-12-22
  Filled 2021-12-21: qty 15

## 2021-12-21 MED ORDER — FLUCONAZOLE 150 MG PO TABS
150.0000 mg | ORAL_TABLET | ORAL | Status: DC
  Administered 2021-12-21: 150 mg via ORAL
  Filled 2021-12-21: qty 1

## 2021-12-21 MED ORDER — CHLORHEXIDINE GLUCONATE CLOTH 2 % EX PADS
6.0000 | MEDICATED_PAD | Freq: Every day | CUTANEOUS | Status: DC
  Administered 2021-12-21 – 2021-12-22 (×2): 6 via TOPICAL

## 2021-12-21 MED ORDER — FAMOTIDINE 20 MG PO TABS
20.0000 mg | ORAL_TABLET | Freq: Every day | ORAL | Status: DC
  Administered 2021-12-21 – 2021-12-22 (×2): 20 mg via ORAL
  Filled 2021-12-21 (×2): qty 1

## 2021-12-21 NOTE — Plan of Care (Signed)

## 2021-12-21 NOTE — Progress Notes (Signed)
Due to reduced CrCl, ok to adjust famotidine to '20mg'$  qday per Dr. Raymondo Band. ? ?Onnie Boer, PharmD, BCIDP, AAHIVP, CPP ?Infectious Disease Pharmacist ?12/21/2021 4:17 PM ? ? ?

## 2021-12-21 NOTE — Plan of Care (Signed)
?  Problem: Education: ?Goal: Knowledge of General Education information will improve ?Description: Including pain rating scale, medication(s)/side effects and non-pharmacologic comfort measures ?12/21/2021 0149 by Jule Ser, RN ?Outcome: Progressing ?12/21/2021 0013 by Jule Ser, RN ?Outcome: Progressing ?  ?Problem: Health Behavior/Discharge Planning: ?Goal: Ability to manage health-related needs will improve ?12/21/2021 0149 by Jule Ser, RN ?Outcome: Progressing ?12/21/2021 0013 by Jule Ser, RN ?Outcome: Progressing ?  ?Problem: Clinical Measurements: ?Goal: Ability to maintain clinical measurements within normal limits will improve ?12/21/2021 0149 by Jule Ser, RN ?Outcome: Progressing ?12/21/2021 0013 by Jule Ser, RN ?Outcome: Progressing ?Goal: Will remain free from infection ?12/21/2021 0149 by Jule Ser, RN ?Outcome: Progressing ?12/21/2021 0013 by Jule Ser, RN ?Outcome: Progressing ?Goal: Diagnostic test results will improve ?12/21/2021 0149 by Jule Ser, RN ?Outcome: Progressing ?12/21/2021 0013 by Jule Ser, RN ?Outcome: Progressing ?Goal: Respiratory complications will improve ?12/21/2021 0149 by Jule Ser, RN ?Outcome: Progressing ?12/21/2021 0013 by Jule Ser, RN ?Outcome: Progressing ?Goal: Cardiovascular complication will be avoided ?12/21/2021 0149 by Jule Ser, RN ?Outcome: Progressing ?12/21/2021 0013 by Jule Ser, RN ?Outcome: Progressing ?  ?Problem: Activity: ?Goal: Risk for activity intolerance will decrease ?12/21/2021 0149 by Jule Ser, RN ?Outcome: Progressing ?12/21/2021 0013 by Jule Ser, RN ?Outcome: Progressing ?  ?Problem: Nutrition: ?Goal: Adequate nutrition will be maintained ?12/21/2021 0149 by Jule Ser, RN ?Outcome: Progressing ?12/21/2021 0013 by Jule Ser, RN ?Outcome: Progressing ?  ?Problem: Coping: ?Goal: Level of anxiety will decrease ?12/21/2021 0149 by Jule Ser, RN ?Outcome: Progressing ?12/21/2021 0013 by Jule Ser, RN ?Outcome: Progressing ?  ?Problem: Elimination: ?Goal: Will not experience complications related to bowel motility ?12/21/2021 0149 by Jule Ser, RN ?Outcome: Progressing ?12/21/2021 0013 by Jule Ser, RN ?Outcome: Progressing ?Goal: Will not experience complications related to urinary retention ?12/21/2021 0149 by Jule Ser, RN ?Outcome: Progressing ?12/21/2021 0013 by Jule Ser, RN ?Outcome: Progressing ?  ?Problem: Pain Managment: ?Goal: General experience of comfort will improve ?12/21/2021 0149 by Jule Ser, RN ?Outcome: Progressing ?12/21/2021 0013 by Jule Ser, RN ?Outcome: Progressing ?  ?Problem: Safety: ?Goal: Ability to remain free from injury will improve ?12/21/2021 0149 by Jule Ser, RN ?Outcome: Progressing ?12/21/2021 0013 by Jule Ser, RN ?Outcome: Progressing ?  ?Problem: Skin Integrity: ?Goal: Risk for impaired skin integrity will decrease ?12/21/2021 0149 by Jule Ser, RN ?Outcome: Progressing ?12/21/2021 0013 by Jule Ser, RN ?Outcome: Progressing ?  ?

## 2021-12-21 NOTE — Care Management Obs Status (Signed)
MEDICARE OBSERVATION STATUS NOTIFICATION ? ? ?Patient Details  ?Name: [name] ?MRN: [id number] ?Date of Birth: Oct 13, 1965 ? ? ?Medicare Observation Status Notification Given:  Yes ? ? ? ?Tom-Johnson, Renea Ee, RN ?12/21/2021, 2:54 PM ?

## 2021-12-21 NOTE — Plan of Care (Signed)
  Problem: Nutrition: Goal: Adequate nutrition will be maintained Outcome: Progressing   

## 2021-12-21 NOTE — Consult Note (Addendum)
[facility] Nurse ostomy consult note ?Stoma type/location: LLQ colostomy. Current pouching system is falling off and being held on with duct tape. ?Stomal assessment/size: 1 and 5/8 inch oval, pale pink, lumen in center ?Peristomal assessment: intact, clear ?Treatment options for stomal/peristomal skin: None ?Output: soft brown stool  ?Ostomy pouching: 2pc. 2 and 3/4 inch pouching system ?Education provided: None, patient is independent in ostomy care ?Enrolled patient in [place] program: No  ? ?WOC Nurse Consult Note: ?Reason for Consult:Patient is well known to our service, is also followed intermittently by the outpatient [facility]. Last seen by PA-C Jeri Cos III. He has had a rotational flap and chronic nonhealing wounds for many years. ?Wound type:Pressure, chronic, nonhealing ?Pressure Injury POA: Yes ?Measurement: ?Scrotal wound (Stage 3 PI) measured 3cm x 2cm x 0.2cm ?Left IT (Stage 4): 3cm x 2cm x 2cm  ?Right IT (Stage 4): 3cm x 3cm x 1cm  ?Central wound (Sacrum): (Stage 4) 2xm c 2cm x 1cm ?Wound ZOX:WRUE pink, moderate serous exudate ?Drainage (amount, consistency, odor) As noted above ?Periwound:with evidence of previous wound healing and surgery, scarring. ?Dressing procedure/placement/frequency: ?I will provide the patient with a mattress replacement with low air loss feature today. He knows that he should keep the [facility] at or below a 30 degree angle as tolerated and when reminded of this he relays that he will be eating breakfast soon. ?Topical care will be with a silver hydrofiber (Aquacel Ag+, [facility] # [id number]) and he reminds me that it is only to be placed on top of the wounds as he cannot tolerate it being "packed or inserted in any way".  ? ?[facility] nursing team will not follow, but will remain available to this patient, the nursing and medical teams.  Please re-consult if needed. ?Thanks, ?Maudie Flakes, MSN, RN, [place], [place], CWON-AP, [place]  ?Pager# [phone]  ? ? ? ?  ?
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
## 2021-12-22 ENCOUNTER — Other Ambulatory Visit (HOSPITAL_COMMUNITY): Payer: Self-pay

## 2021-12-22 DIAGNOSIS — G8220 Paraplegia, unspecified: Secondary | ICD-10-CM

## 2021-12-22 DIAGNOSIS — Z978 Presence of other specified devices: Secondary | ICD-10-CM

## 2021-12-22 DIAGNOSIS — Z6841 Body Mass Index (BMI) 40.0 and over, adult: Secondary | ICD-10-CM

## 2021-12-22 DIAGNOSIS — K219 Gastro-esophageal reflux disease without esophagitis: Secondary | ICD-10-CM

## 2021-12-22 DIAGNOSIS — L98491 Non-pressure chronic ulcer of skin of other sites limited to breakdown of skin: Secondary | ICD-10-CM

## 2021-12-22 DIAGNOSIS — L304 Erythema intertrigo: Secondary | ICD-10-CM | POA: Diagnosis present

## 2021-12-22 DIAGNOSIS — N3001 Acute cystitis with hematuria: Secondary | ICD-10-CM

## 2021-12-22 DIAGNOSIS — L89154 Pressure ulcer of sacral region, stage 4: Secondary | ICD-10-CM

## 2021-12-22 LAB — CBC
HCT: 26.2 % — ABNORMAL LOW (ref 39.0–52.0)
Hemoglobin: 8 g/dL — ABNORMAL LOW (ref 13.0–17.0)
MCH: 27 pg (ref 26.0–34.0)
MCHC: 30.5 g/dL (ref 30.0–36.0)
MCV: 88.5 fL (ref 80.0–100.0)
Platelets: 308 10*3/uL (ref 150–400)
RBC: 2.96 MIL/uL — ABNORMAL LOW (ref 4.22–5.81)
RDW: 14.9 % (ref 11.5–15.5)
WBC: 7.3 10*3/uL (ref 4.0–10.5)
nRBC: 0 % (ref 0.0–0.2)

## 2021-12-22 LAB — BASIC METABOLIC PANEL
Anion gap: 8 (ref 5–15)
BUN: 62 mg/dL — ABNORMAL HIGH (ref 6–20)
CO2: 19 mmol/L — ABNORMAL LOW (ref 22–32)
Calcium: 8.8 mg/dL — ABNORMAL LOW (ref 8.9–10.3)
Chloride: 115 mmol/L — ABNORMAL HIGH (ref 98–111)
Creatinine, Ser: 2.36 mg/dL — ABNORMAL HIGH (ref 0.61–1.24)
GFR, Estimated: 31 mL/min — ABNORMAL LOW (ref >60)
Glucose, Bld: 86 mg/dL (ref 70–99)
Potassium: 4.5 mmol/L (ref 3.5–5.1)
Sodium: 142 mmol/L (ref 135–145)

## 2021-12-22 LAB — MAGNESIUM: Magnesium: 1.6 mg/dL — ABNORMAL LOW (ref 1.7–2.4)

## 2021-12-22 MED ORDER — CLOTRIMAZOLE 1 % EX CREA
TOPICAL_CREAM | Freq: Two times a day (BID) | CUTANEOUS | 0 refills | Status: DC
  Filled 2021-12-22: qty 28, 7d supply, fill #0

## 2021-12-22 MED ORDER — SODIUM CHLORIDE 0.9 % IV SOLN
1.0000 g | INTRAVENOUS | Status: AC
  Administered 2021-12-22: 1 g via INTRAVENOUS
  Filled 2021-12-22: qty 10

## 2021-12-22 MED ORDER — FAMOTIDINE 20 MG PO TABS
20.0000 mg | ORAL_TABLET | Freq: Every day | ORAL | 0 refills | Status: DC
Start: 2021-12-23 — End: 2022-01-09
  Filled 2021-12-22: qty 30, 30d supply, fill #0

## 2021-12-22 MED ORDER — MAGNESIUM SULFATE 2 GM/50ML IV SOLN
2.0000 g | Freq: Once | INTRAVENOUS | Status: AC
Start: 2021-12-22 — End: 2021-12-22
  Administered 2021-12-22: 2 g via INTRAVENOUS
  Filled 2021-12-22: qty 50

## 2021-12-22 MED ORDER — FLUCONAZOLE 150 MG PO TABS
150.0000 mg | ORAL_TABLET | ORAL | 0 refills | Status: AC
Start: 2021-12-28 — End: 2022-01-12
  Filled 2021-12-22: qty 3, 21d supply, fill #0

## 2021-12-22 NOTE — TOC Transition Note (Signed)
Transition of Care (TOC) - CM/SW Discharge Note ? ? ?Patient Details  ?Name: [name] ?MRN: [id number] ?Date of Birth: [birth year] ? ?Transition of Care (TOC) CM/SW Contact:  ?Tom-Johnson, Renea Ee, RN ?Phone Number: ?12/22/2021, 12:10 PM ? ? ?Clinical Narrative:    ? ?Patient is scheduled for discharge today. No recommendations noted Patient requesting for transportation at discharge. [facility] called and scheduled transportation. No further TOC needs noted.  ? ?  ?  ? ? ?Patient Goals and CMS Choice ?  ?  ?  ? ?Discharge Placement ?  ?           ?  ?  ?  ?  ? ?Discharge Plan and Services ?  ?  ?           ?  ?  ?  ?  ?  ?  ?  ?  ?  ?  ? ?Social Determinants of Health (SDOH) Interventions ?  ? ? ?Readmission Risk Interventions ?   ? View : No data to display.  ?  ?  ?  ? ? ? ? ? ?

## 2021-12-22 NOTE — Discharge Summary (Addendum)
? ?Name: [name] ?MRN: [id number] ?DOB: 06/28/1966 56 y.o. ?PCP: [name], MD ? ?Date of Admission: 12/20/2021 10:32 AM ?Date of Discharge:  12/22/2021 ?Attending Physician: Dr. Angelia Mould ? ?DISCHARGE DIAGNOSIS:  ?Primary Problem: Acute kidney injury ([place])  ? ?Hospital Problems: ?Principal Problem: ?  Acute kidney injury ([place]) ?Active Problems: ?  Paraplegia ([place]) ?  Chronic pain ?  Hypertension ?  Colostomy in place for fecal diversion ?  Decubitus ulcer of sacral region, stage 4 ([place]) ?  UTI (urinary tract infection) ?  Obstructive sleep apnea ?  GERD (gastroesophageal reflux disease) ?  Chronic indwelling Foley catheter ?  Morbid obesity with BMI of 40.0-44.9, adult ([place]) ?  Intertriginous skin ulcer ([place]) ?  ? ?DISCHARGE MEDICATIONS:  ? ?Allergies as of 12/22/2021   ? ?   Reactions  ? Pork-derived Products Other (See Comments)  ? Don't eat pork  ? ?  ? ?  ?Medication List  ?  ? ?STOP taking these medications   ? ?colchicine 0.6 MG tablet ?  ?IBGARD PO ?  ? ?  ? ?TAKE these medications   ? ?aspirin EC 81 MG tablet ?Take 81 mg by mouth every morning. Swallow whole. ?  ?Blood Pressure Monitor Devi ?Please provide patient with insurance approved blood pressure monitor. ICD10 I.10 ?  ?CALCIUM-D PO ?Take 1 tablet by mouth every morning. ?  ?clotrimazole 1 % cream ?Commonly known as: LOTRIMIN ?Apply topically 2 (two) times daily. ?  ?diclofenac Sodium 1 % Gel ?Commonly known as: VOLTAREN ?Apply 2 g topically 3 (three) times daily. ?  ?famotidine 20 MG tablet ?Commonly known as: PEPCID ?Take 1 tablet (20 mg total) by mouth daily. ?Start taking on: December 23, 2021 ?What changed:  ?medication strength ?how much to take ?when to take this ?  ?fluconazole 150 MG tablet ?Commonly known as: DIFLUCAN ?Take 1 tablet (150 mg total) by mouth once a week for 3 doses. ?Start taking on: December 28, 2021 ?  ?GAS-X PO ?Take 2 tablets by mouth at bedtime. chewable ?  ?ibuprofen 200 MG tablet ?Commonly known as: ADVIL ?Take  200 mg by mouth every 6 (six) hours as needed for mild pain. ?  ?liver oil-zinc oxide 40 % ointment ?Commonly known as: DESITIN ?Apply 1 application topically daily. ?  ?lubiprostone 24 MCG capsule ?Commonly known as: AMITIZA ?TAKE 1 CAPSULE (24 MCG TOTAL) BY MOUTH 2 (TWO) TIMES DAILY WITH A MEAL. ?What changed: when to take this ?  ?METHADONE HCL PO ?Take 50 mg by mouth daily. ?  ?NASAL SPRAY NA ?Place 1 spray into both nostrils daily as needed (congestion). Family care nasal spray ?  ?Olmesartan-amLODIPine-HCTZ 40-10-25 MG Tabs ?Take 1 tablet by mouth daily. ?  ?OVER THE COUNTER MEDICATION ?Take 3 capsules by mouth every morning. Omega XL ?  ?polyethylene glycol 17 g packet ?Commonly known as: MIRALAX / GLYCOLAX ?Take 17 g by mouth every morning. ?  ?saccharomyces boulardii 250 MG capsule ?Commonly known as: FLORASTOR ?Take 250 mg by mouth daily. ?  ?VITAMIN C PO ?Take 1 tablet by mouth every morning. ?  ? ?  ? ?  ?  ? ? ?  ?Discharge Care Instructions  ?(From admission, onward)  ?  ? ? ?  ? ?  Start     Ordered  ? 12/22/21 0000  Discharge wound care:       ?Comments: Follow up with wound clinic  ? 12/22/21 1208  ? ?  ?  ? ?  ? ? ?  DISPOSITION AND FOLLOW-UP:  ?[name], [name] was discharged from [facility] in Stable condition. At the hospital follow up visit please address: ? ?AKI: AKI is secondary to dehydration from poor po intake and intrinsic injury from colchicine and ibuprofen. Cr 5.49 on admission and down trended to 2.36. Please recheck BMP at hospital follow up ?UTI: Treated with 3 days of Rocephin and foley was exchanged. Urinary symptoms resolved.  ?Intertrigo: Patient has a yeast infection under his abdominal folds- reportedly has tried topical meds. Started on fluconazole 150 mg per week x 4 weeks. Discharged with 3 more doses.  ? ?Follow-up Recommendations: ?Consults: none ?Labs: Basic Metabolic Profile ?Studies: none ?Medications: Fluconazole 150 mg (3 doses), reduced famotidine  dose to 20 mg, clotrimazole  ? ?Follow-up Appointments: ? Follow-up Information   ? ? [name], MD. Schedule an appointment as soon as possible for a visit.   ?Specialty: Internal Medicine ?Why: Clinic should be calling you to set up a hospital follow up next week. If you do not hear by Monday, please call [phone] ?Contact information: ?[address] ?[place] [address] ?[phone] ? ? ?  ?  ? ?  ?  ? ?  ? ? ?HOSPITAL COURSE:  ?Patient Summary: ?#AKI, stage 3 ?#Hypomagnesemia 2/2 diuretic use ?#Dehydration ?Creatinine was 5.49 on admission. DZH:GDJMEQASTM ratio is 19.5. Etiology of AKI likely a combination of prerenal in the setting of dehydration from poor po intake the past week and intrinsic injury in the setting of the patient taking colchicine and ibuprofen Patient received IV fluids and Mg 1g IV in the ED. No hydronephrosis was seen on CT abd/pelvis. Creatinine downtrended to 3.95 on hospital day 1 and patient was continued on IV fluids. Mg level was 1.5 and patient was given IV Mg. Creatinine continued to appropriately downtrend and was 2.36 on day of discharge. Patient discharged with instructions for close outpatient follow-up. Recommend BMP be checked at hospital follow up.  ?  ?#UTI in the setting of indwelling foley catheter ?Large leukocytes and 21-50 WBCs with bacteria seen on urinalysis. Previous urine culture in November 2022 positive for Klebsiella Oxytoca. Ceftriaxone chosen as empiric treatment based on past urine culture sensitivities and started on 3/21. Foley with clear yellow urine. Patient with no leukocytosis and remains afebrile. Patient received last dose of ceftriaxone on 3/23. Urinary urgency improved.  ?  ?#Decubitus ulcers Stage 4 sacrum, Stage 3 at scrotum ?#Paraplegia ?#Intertrigo with superficial skin ulceration ?Patient has multiple decubitus ulcers adjacent to the right lower sacrum, right ischial tuberosity, and proximal right femur. History of recurrent  decubitus ulcers due to paraplegia. Wound care consulted. Due to large region of fungal infection surrounding ulcers, patient was started on oral fluconazole. Will continue course of fluconazole weekly for 3 more doses. Also discharged with topical clotrimazole to apply to abdominal folders. ?  ?#Iron deficiency anemia ?Hgb 8.4 on admission and stable throughout hospitalization. Patient asymptomatic during hospitalization. History of iron deficiency anemia. Vit B12 wnl. Decreased iron, TIBC, and Saturation ratio. Did not start iron supplementation during hospitalization due to ongoing infections. Recommend restarting oral iron supplementation at outpatient follow-up. ? ?  ? ?DISCHARGE INSTRUCTIONS:  ? ?Discharge Instructions   ? ? Call MD for:  persistant dizziness or light-headedness   Complete by: As directed ?  ? Call MD for:  persistant nausea and vomiting   Complete by: As directed ?  ? Call MD for:  redness, tenderness, or signs of infection (pain, swelling, redness, odor or  green/yellow discharge around incision site)   Complete by: As directed ?  ? Call MD for:  temperature >100.4   Complete by: As directed ?  ? Diet - low sodium heart healthy   Complete by: As directed ?  ? Discharge instructions   Complete by: As directed ?  ? Dear [name], ? ?You were hospitalized because you were dehydrated and had a urinary tract infection. We treated your infection with 3 days of antibiotics, which helped resolve your symptoms and you also had your foley replaced in the emergency department. Please follow up with the internal medicine center next week for a hospital follow up appt. They should be calling you to set this up, but if you do not hear from them by Monday please call [phone] to make an appointment. ? ?We also are treating you with a medicine for your yeast infection- you should take fluconazole 150 mg once a week (starting on Wednesday) for 3 more weeks. ? ?We also recommend you stop taking the  peppermint oil, as this can make your GERD symptoms worse. ? ?If you have any questions or concerns, call our clinic at [phone] or after hours call [phone] and ask for the internal medicine resident o

## 2021-12-22 NOTE — TOC Transition Note (Signed)
Transition of Care (TOC) - CM/SW Discharge Note ? ? ?Patient Details  ?Name: [name] ?MRN: [id number] ?Date of Birth: [birth year] ? ?Transition of Care (TOC) CM/SW Contact:  ?Tom-Johnson, Renea Ee, RN ?Phone Number: ?12/22/2021, 3:23 PM ? ? ?Clinical Narrative:    ? ?[facility] arrived to transport patient home and he told them no one will be at home till after 4:30 pm. CM called and rescheduled. No further TOC needs noted. ? ?Final next level of care: Home/Self Care ?  ? ? ?Patient Goals and CMS Choice ?  ?  ?  ? ?Discharge Placement ?  ?           ?  ?  ?  ?  ? ?Discharge Plan and Services ?  ?  ?           ?  ?  ?  ?  ?  ?  ?  ?  ?  ?  ? ?Social Determinants of Health (SDOH) Interventions ?  ? ? ?Readmission Risk Interventions ?   ? View : No data to display.  ?  ?  ?  ? ? ? ? ? ?

## 2021-12-22 NOTE — Progress Notes (Signed)
DISCHARGE NOTE HOME ?[name] to be discharged Home per MD order. Discussed prescriptions and follow up appointments with the patient. Prescriptions given to patient; medication list explained in detail. Patient verbalized understanding. ? ?Skin clean, dry and intact without evidence of skin break down, no evidence of skin tears noted. IV catheter discontinued intact. Site without signs and symptoms of complications. Dressing and pressure applied. Pt denies pain at the site currently. No complaints noted.  ? ?An After Visit Summary (AVS) was printed and given to the patient. ?Patient escorted via stretcher, and discharged home via [place]. ? ?Vira Agar, RN  ?

## 2021-12-22 NOTE — Plan of Care (Signed)

## 2021-12-22 NOTE — Progress Notes (Incomplete)
Initial Nutrition Assessment ? ?DOCUMENTATION CODES:  ? ?  ? ?INTERVENTION:  ?*** ? ? ?NUTRITION DIAGNOSIS:  ? ?  related to   as evidenced by  . ? ?*** ? ?GOAL:  ? ?  ? ?*** ? ?MONITOR:  ? ?  ? ?REASON FOR ASSESSMENT:  ? ?Consult ?Assessment of nutrition requirement/status ? ?ASSESSMENT:  ? ?  ? ?***  ? ?NUTRITION - FOCUSED PHYSICAL EXAM: ? ?{RD Focused Exam List:21252} ? ?Diet Order:   ?Diet Order   ? ?       ?  Diet regular Room service appropriate? Yes; Fluid consistency: Thin  Diet effective now       ?  ? ?  ?  ? ?  ? ? ?EDUCATION NEEDS:  ? ?  ? ?Skin:    ? ?Last BM:    ? ?Height:  ? ?Ht Readings from Last 1 Encounters:  ?12/20/21 6' (1.829 m)  ? ? ?Weight:  ? ?Wt Readings from Last 1 Encounters:  ?12/21/21 (!) 147.9 kg  ? ? ?Ideal Body Weight:    ? ?BMI:  Body mass index is 44.21 kg/m?. ? ?Estimated Nutritional Needs:  ? ?Kcal:    ? ?Protein:    ? ?Fluid:    ? ? ? ?*** ?

## 2021-12-23 DIAGNOSIS — Z7689 Persons encountering health services in other specified circumstances: Secondary | ICD-10-CM | POA: Diagnosis not present

## 2021-12-24 DIAGNOSIS — Z7689 Persons encountering health services in other specified circumstances: Secondary | ICD-10-CM | POA: Diagnosis not present

## 2021-12-25 LAB — CULTURE, BLOOD (ROUTINE X 2): Special Requests: ADEQUATE

## 2021-12-26 LAB — CULTURE, BLOOD (ROUTINE X 2)
Culture: NO GROWTH
Special Requests: ADEQUATE

## 2021-12-27 LAB — BLOOD CULTURE ID PANEL (REFLEXED) - BCID2

## 2021-12-29 ENCOUNTER — Encounter: Payer: Medicare Other | Admitting: Student

## 2021-12-30 ENCOUNTER — Ambulatory Visit (INDEPENDENT_AMBULATORY_CARE_PROVIDER_SITE_OTHER): Payer: Medicare Other | Admitting: Student

## 2021-12-30 ENCOUNTER — Encounter: Payer: Self-pay | Admitting: Student

## 2021-12-30 VITALS — BP 112/51 | HR 86 | Temp 98.3°F | Ht 72.0 in

## 2021-12-30 DIAGNOSIS — N3001 Acute cystitis with hematuria: Secondary | ICD-10-CM

## 2021-12-30 DIAGNOSIS — T560X1A Toxic effect of lead and its compounds, accidental (unintentional), initial encounter: Secondary | ICD-10-CM

## 2021-12-30 DIAGNOSIS — I10 Essential (primary) hypertension: Secondary | ICD-10-CM | POA: Diagnosis not present

## 2021-12-30 DIAGNOSIS — N179 Acute kidney failure, unspecified: Secondary | ICD-10-CM | POA: Diagnosis not present

## 2021-12-30 DIAGNOSIS — M109 Gout, unspecified: Secondary | ICD-10-CM | POA: Diagnosis not present

## 2021-12-30 DIAGNOSIS — M1A1390 Lead-induced chronic gout, unspecified wrist, without tophus (tophi): Secondary | ICD-10-CM

## 2021-12-30 DIAGNOSIS — K219 Gastro-esophageal reflux disease without esophagitis: Secondary | ICD-10-CM | POA: Diagnosis not present

## 2021-12-30 NOTE — Assessment & Plan Note (Signed)
[name] reports he was recently taken off colchicine during his hospitalization. He is concerned for returning gout attacks. He has had 2-3 gout attacks within the last few months. Describes these episodes as extremely painful which severely limits his functionality. Further mentions specific sites such as first thumb joint and his wrist as previous places he has experienced with this. He has seen improvement after receiving colchicine and prednisone during these episodes.   ? ?We do not have any imaging indicating possible gout or synovial fluid analysis with monosodium urate crystals. Per ACR Gout Classification Criteria, he does meet the criteria for gout (11). We will plan to check his renal function and initiate urate-lowering therapy with recent uric acid of 12.6. However, we do not have a definitive diagnosis of gout. If we are able to lower uric acid appropriately and patient is still having flares, would stop allopurinol as this might indicate CPPD instead. Ideally would like for him to return to clinic during a flare so we could obtain synovial fluid. ? ?- Repeat BMP today ?- Start allopurinol '100mg'$  ?- Repeat uric acid levels in 2-3 months ?

## 2021-12-30 NOTE — Patient Instructions (Signed)
[name], it was a pleasure seeing you today! ? ?Today we discussed: ?- I will check your kidney numbers today and call you with the results. If your kidney numbers are normal I will start a medication to help your gout. ? ?I have ordered the following labs today: ? ?Lab Orders    ?     BMP8+Anion Gap    ?  ? ?Follow-up: 3 months  ? ?Please make sure to arrive 15 minutes prior to your next appointment. If you arrive late, you may be asked to reschedule.  ? ?We look forward to seeing you next time. Please call our clinic at [phone] if you have any questions or concerns. The best time to call is Monday-Friday from 9am-4pm, but there is someone available 24/7. If after hours or the weekend, call the main hospital number and ask for the Internal Medicine Resident On-Call. If you need medication refills, please notify your pharmacy one week in advance and they will send [place] a request. ? ?Thank you for letting us take part in your care. Wishing you the best! ? ?Thank you, ?[name], MD ? ?

## 2021-12-30 NOTE — Assessment & Plan Note (Signed)
Patient was recently admitted for urinary tract infection complicated by acute kidney injury. During admission he completed course of IV antibiotics with appropriate response. Since discharge, [name] reports he's been doing well overall. He has been eating and drinking normally. He does have a chronic indwelling Foley which is managed by his caretaker. He has not noticed any changes in urine since hospitalization. Will obtain repeat BMP today. ?
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
## 2021-12-30 NOTE — Assessment & Plan Note (Signed)
Patient was recently hospitalized and found to have significant acute kidney injury, presumably due to both pre-renal and intrinsic causes. Prior to hospitalization patient was not eating or drinking well. In addition, he was taking colchicine and ibuprofen. Since discharge, his appetite has returned and has been eating and drinking normally. He has not taken either colchicine or ibuprofen. Today we will repeat BMP to re-evaluate. ? ?- Repeat BMP today ?

## 2021-12-31 DIAGNOSIS — Z7689 Persons encountering health services in other specified circumstances: Secondary | ICD-10-CM | POA: Diagnosis not present

## 2021-12-31 LAB — BMP8+ANION GAP
Anion Gap: 18 mmol/L (ref 10.0–18.0)
BUN/Creatinine Ratio: 15 (ref 9–20)
BUN: 30 mg/dL — ABNORMAL HIGH (ref 6–24)
CO2: 19 mmol/L — ABNORMAL LOW (ref 20–29)
Calcium: 8.5 mg/dL — ABNORMAL LOW (ref 8.7–10.2)
Chloride: 106 mmol/L (ref 96–106)
Creatinine, Ser: 1.97 mg/dL — ABNORMAL HIGH (ref 0.76–1.27)
Glucose: 102 mg/dL — ABNORMAL HIGH (ref 70–99)
Potassium: 4.2 mmol/L (ref 3.5–5.2)
Sodium: 143 mmol/L (ref 134–144)
eGFR: 39 mL/min/{1.73_m2} — ABNORMAL LOW (ref >59)

## 2022-01-02 ENCOUNTER — Other Ambulatory Visit: Payer: Self-pay | Admitting: Student

## 2022-01-02 DIAGNOSIS — Z7689 Persons encountering health services in other specified circumstances: Secondary | ICD-10-CM | POA: Diagnosis not present

## 2022-01-03 DIAGNOSIS — Z7689 Persons encountering health services in other specified circumstances: Secondary | ICD-10-CM | POA: Diagnosis not present

## 2022-01-04 DIAGNOSIS — Z7689 Persons encountering health services in other specified circumstances: Secondary | ICD-10-CM | POA: Diagnosis not present

## 2022-01-05 DIAGNOSIS — Z7689 Persons encountering health services in other specified circumstances: Secondary | ICD-10-CM | POA: Diagnosis not present

## 2022-01-05 NOTE — Progress Notes (Signed)
Internal Medicine Clinic Attending ? ?Case discussed with Dr. Braswell  At the time of the visit.  We reviewed the resident?s history and exam and pertinent patient test results.  I agree with the assessment, diagnosis, and plan of care documented in the resident?s note.  ?

## 2022-01-06 DIAGNOSIS — Z7689 Persons encountering health services in other specified circumstances: Secondary | ICD-10-CM | POA: Diagnosis not present

## 2022-01-07 DIAGNOSIS — Z7689 Persons encountering health services in other specified circumstances: Secondary | ICD-10-CM | POA: Diagnosis not present

## 2022-01-09 ENCOUNTER — Telehealth: Payer: Self-pay | Admitting: Internal Medicine

## 2022-01-09 ENCOUNTER — Telehealth: Payer: Self-pay | Admitting: Student

## 2022-01-09 DIAGNOSIS — Z7689 Persons encountering health services in other specified circumstances: Secondary | ICD-10-CM | POA: Diagnosis not present

## 2022-01-09 MED ORDER — FAMOTIDINE 20 MG PO TABS: 20.0000 mg | ORAL_TABLET | Freq: Every day | ORAL | 2 refills | Status: DC

## 2022-01-09 MED ORDER — CLOTRIMAZOLE 1 % EX CREA: TOPICAL_CREAM | Freq: Two times a day (BID) | CUTANEOUS | 0 refills | Status: AC

## 2022-01-09 MED ORDER — ALLOPURINOL 100 MG PO TABS: 100.0000 mg | ORAL_TABLET | Freq: Every day | ORAL | 2 refills | Status: DC

## 2022-01-09 MED ORDER — OLMESARTAN-AMLODIPINE-HCTZ 40-10-25 MG PO TABS: 1.0000 | ORAL_TABLET | Freq: Every day | ORAL | 2 refills | Status: DC

## 2022-01-09 NOTE — Telephone Encounter (Signed)
Called and discussed recent results with Mr. Narine. During our conversation, he reports increased drainage from his wound and overall "not feeling like himself." I encouraged him to be seen by our clinic as soon as possible. I will message the front desk to schedule for the next available appointment. ? ?[name], MD ?Internal Medicine PGY-2 ?Pager: [phone] ?

## 2022-01-09 NOTE — Telephone Encounter (Signed)
Request lotramin refill--sent to pharmacy.  ?

## 2022-01-09 NOTE — Addendum Note (Signed)
Addended bySanjuan Dame on: 01/09/2022 10:52 AM ? ? Modules accepted: Orders ? ?

## 2022-01-10 DIAGNOSIS — Z7689 Persons encountering health services in other specified circumstances: Secondary | ICD-10-CM | POA: Diagnosis not present

## 2022-01-11 DIAGNOSIS — Z7689 Persons encountering health services in other specified circumstances: Secondary | ICD-10-CM | POA: Diagnosis not present

## 2022-01-11 DIAGNOSIS — G4733 Obstructive sleep apnea (adult) (pediatric): Secondary | ICD-10-CM | POA: Diagnosis not present

## 2022-01-12 DIAGNOSIS — Z7689 Persons encountering health services in other specified circumstances: Secondary | ICD-10-CM | POA: Diagnosis not present

## 2022-01-13 DIAGNOSIS — Z7689 Persons encountering health services in other specified circumstances: Secondary | ICD-10-CM | POA: Diagnosis not present

## 2022-01-14 DIAGNOSIS — Z7689 Persons encountering health services in other specified circumstances: Secondary | ICD-10-CM | POA: Diagnosis not present

## 2022-01-15 ENCOUNTER — Emergency Department (HOSPITAL_COMMUNITY): Payer: Medicare Other

## 2022-01-15 ENCOUNTER — Inpatient Hospital Stay (HOSPITAL_COMMUNITY)
Admission: EM | Admit: 2022-01-15 | Discharge: 2022-02-12 | DRG: 698 | Disposition: A | Discharge status: Home / Self Care | Payer: Medicare Other | Attending: Internal Medicine | Admitting: Internal Medicine

## 2022-01-15 DIAGNOSIS — N179 Acute kidney failure, unspecified: Secondary | ICD-10-CM | POA: Diagnosis present

## 2022-01-15 DIAGNOSIS — Z515 Encounter for palliative care: Secondary | ICD-10-CM | POA: Diagnosis not present

## 2022-01-15 DIAGNOSIS — K592 Neurogenic bowel, not elsewhere classified: Secondary | ICD-10-CM | POA: Diagnosis not present

## 2022-01-15 DIAGNOSIS — I700 Atherosclerosis of aorta: Secondary | ICD-10-CM | POA: Diagnosis present

## 2022-01-15 DIAGNOSIS — I10 Essential (primary) hypertension: Secondary | ICD-10-CM | POA: Diagnosis not present

## 2022-01-15 DIAGNOSIS — Z20822 Contact with and (suspected) exposure to covid-19: Secondary | ICD-10-CM | POA: Diagnosis present

## 2022-01-15 DIAGNOSIS — Z9689 Presence of other specified functional implants: Secondary | ICD-10-CM | POA: Diagnosis present

## 2022-01-15 DIAGNOSIS — Z833 Family history of diabetes mellitus: Secondary | ICD-10-CM

## 2022-01-15 DIAGNOSIS — W3400XS Accidental discharge from unspecified firearms or gun, sequela: Secondary | ICD-10-CM

## 2022-01-15 DIAGNOSIS — Z602 Problems related to living alone: Secondary | ICD-10-CM | POA: Diagnosis present

## 2022-01-15 DIAGNOSIS — L304 Erythema intertrigo: Secondary | ICD-10-CM | POA: Diagnosis not present

## 2022-01-15 DIAGNOSIS — T83518A Infection and inflammatory reaction due to other urinary catheter, initial encounter: Secondary | ICD-10-CM | POA: Diagnosis not present

## 2022-01-15 DIAGNOSIS — Z7982 Long term (current) use of aspirin: Secondary | ICD-10-CM

## 2022-01-15 DIAGNOSIS — M79604 Pain in right leg: Secondary | ICD-10-CM | POA: Diagnosis not present

## 2022-01-15 DIAGNOSIS — E1169 Type 2 diabetes mellitus with other specified complication: Secondary | ICD-10-CM | POA: Diagnosis not present

## 2022-01-15 DIAGNOSIS — Z9049 Acquired absence of other specified parts of digestive tract: Secondary | ICD-10-CM

## 2022-01-15 DIAGNOSIS — G8220 Paraplegia, unspecified: Secondary | ICD-10-CM

## 2022-01-15 DIAGNOSIS — F1190 Opioid use, unspecified, uncomplicated: Secondary | ICD-10-CM | POA: Diagnosis present

## 2022-01-15 DIAGNOSIS — Z79899 Other long term (current) drug therapy: Secondary | ICD-10-CM

## 2022-01-15 DIAGNOSIS — L89154 Pressure ulcer of sacral region, stage 4: Secondary | ICD-10-CM | POA: Diagnosis present

## 2022-01-15 DIAGNOSIS — Z6841 Body Mass Index (BMI) 40.0 and over, adult: Secondary | ICD-10-CM

## 2022-01-15 DIAGNOSIS — Q549 Hypospadias, unspecified: Secondary | ICD-10-CM

## 2022-01-15 DIAGNOSIS — G4733 Obstructive sleep apnea (adult) (pediatric): Secondary | ICD-10-CM | POA: Diagnosis present

## 2022-01-15 DIAGNOSIS — D638 Anemia in other chronic diseases classified elsewhere: Secondary | ICD-10-CM | POA: Diagnosis present

## 2022-01-15 DIAGNOSIS — I959 Hypotension, unspecified: Secondary | ICD-10-CM | POA: Diagnosis not present

## 2022-01-15 DIAGNOSIS — A419 Sepsis, unspecified organism: Secondary | ICD-10-CM | POA: Diagnosis not present

## 2022-01-15 DIAGNOSIS — K219 Gastro-esophageal reflux disease without esophagitis: Secondary | ICD-10-CM | POA: Diagnosis not present

## 2022-01-15 DIAGNOSIS — M1611 Unilateral primary osteoarthritis, right hip: Secondary | ICD-10-CM | POA: Diagnosis present

## 2022-01-15 DIAGNOSIS — Z743 Need for continuous supervision: Secondary | ICD-10-CM | POA: Diagnosis not present

## 2022-01-15 DIAGNOSIS — M25532 Pain in left wrist: Secondary | ICD-10-CM | POA: Diagnosis present

## 2022-01-15 DIAGNOSIS — Z7401 Bed confinement status: Secondary | ICD-10-CM

## 2022-01-15 DIAGNOSIS — Y846 Urinary catheterization as the cause of abnormal reaction of the patient, or of later complication, without mention of misadventure at the time of the procedure: Secondary | ICD-10-CM | POA: Diagnosis present

## 2022-01-15 DIAGNOSIS — Y738 Miscellaneous gastroenterology and urology devices associated with adverse incidents, not elsewhere classified: Secondary | ICD-10-CM | POA: Diagnosis present

## 2022-01-15 DIAGNOSIS — Z7189 Other specified counseling: Secondary | ICD-10-CM | POA: Diagnosis not present

## 2022-01-15 DIAGNOSIS — K5909 Other constipation: Secondary | ICD-10-CM | POA: Diagnosis present

## 2022-01-15 DIAGNOSIS — D509 Iron deficiency anemia, unspecified: Secondary | ICD-10-CM | POA: Diagnosis not present

## 2022-01-15 DIAGNOSIS — M8668 Other chronic osteomyelitis, other site: Secondary | ICD-10-CM | POA: Diagnosis present

## 2022-01-15 DIAGNOSIS — L089 Local infection of the skin and subcutaneous tissue, unspecified: Secondary | ICD-10-CM

## 2022-01-15 DIAGNOSIS — G8929 Other chronic pain: Secondary | ICD-10-CM | POA: Diagnosis not present

## 2022-01-15 DIAGNOSIS — N390 Urinary tract infection, site not specified: Secondary | ICD-10-CM | POA: Diagnosis not present

## 2022-01-15 DIAGNOSIS — T148XXA Other injury of unspecified body region, initial encounter: Secondary | ICD-10-CM | POA: Diagnosis not present

## 2022-01-15 DIAGNOSIS — Z933 Colostomy status: Secondary | ICD-10-CM

## 2022-01-15 DIAGNOSIS — Q642 Congenital posterior urethral valves: Secondary | ICD-10-CM

## 2022-01-15 DIAGNOSIS — S73015A Posterior dislocation of left hip, initial encounter: Secondary | ICD-10-CM | POA: Diagnosis not present

## 2022-01-15 DIAGNOSIS — L8995 Pressure ulcer of unspecified site, unstageable: Secondary | ICD-10-CM | POA: Diagnosis not present

## 2022-01-15 DIAGNOSIS — M109 Gout, unspecified: Secondary | ICD-10-CM | POA: Diagnosis present

## 2022-01-15 DIAGNOSIS — T83021A Displacement of indwelling urethral catheter, initial encounter: Secondary | ICD-10-CM | POA: Diagnosis present

## 2022-01-15 DIAGNOSIS — Z87891 Personal history of nicotine dependence: Secondary | ICD-10-CM

## 2022-01-15 DIAGNOSIS — N2889 Other specified disorders of kidney and ureter: Secondary | ICD-10-CM | POA: Diagnosis not present

## 2022-01-15 LAB — URINALYSIS, ROUTINE W REFLEX MICROSCOPIC
Bilirubin Urine: NEGATIVE
Bilirubin Urine: NEGATIVE
Glucose, UA: NEGATIVE mg/dL
Glucose, UA: NEGATIVE mg/dL
Ketones, ur: NEGATIVE mg/dL
Ketones, ur: 5 mg/dL — AB
Nitrite: NEGATIVE
Nitrite: NEGATIVE
Protein, ur: 30 mg/dL — AB
Protein, ur: NEGATIVE mg/dL
RBC / HPF: >50 RBC/hpf — ABNORMAL HIGH (ref 0–5)
Specific Gravity, Urine: 1.012 (ref 1.005–1.030)
Specific Gravity, Urine: 1.008 (ref 1.005–1.030)
WBC, UA: >50 WBC/hpf — ABNORMAL HIGH (ref 0–5)
WBC, UA: >50 WBC/hpf — ABNORMAL HIGH (ref 0–5)
pH: 5 (ref 5.0–8.0)
pH: 5 (ref 5.0–8.0)

## 2022-01-15 LAB — CBC WITH DIFFERENTIAL/PLATELET
Abs Immature Granulocytes: 0.06 10*3/uL (ref 0.00–0.07)
Basophils Absolute: 0.1 10*3/uL (ref 0.0–0.1)
Basophils Relative: 1 %
Eosinophils Absolute: 0.3 10*3/uL (ref 0.0–0.5)
Eosinophils Relative: 4 %
HCT: 32.6 % — ABNORMAL LOW (ref 39.0–52.0)
Hemoglobin: 9.5 g/dL — ABNORMAL LOW (ref 13.0–17.0)
Immature Granulocytes: 1 %
Lymphocytes Relative: 26 %
Lymphs Abs: 2.2 10*3/uL (ref 0.7–4.0)
MCH: 26.2 pg (ref 26.0–34.0)
MCHC: 29.1 g/dL — ABNORMAL LOW (ref 30.0–36.0)
MCV: 89.8 fL (ref 80.0–100.0)
Monocytes Absolute: 0.5 10*3/uL (ref 0.1–1.0)
Monocytes Relative: 6 %
Neutro Abs: 5.2 10*3/uL (ref 1.7–7.7)
Neutrophils Relative %: 62 %
Platelets: 398 10*3/uL (ref 150–400)
RBC: 3.63 MIL/uL — ABNORMAL LOW (ref 4.22–5.81)
RDW: 14.9 % (ref 11.5–15.5)
WBC: 8.4 10*3/uL (ref 4.0–10.5)
nRBC: 0 % (ref 0.0–0.2)

## 2022-01-15 LAB — COMPREHENSIVE METABOLIC PANEL
ALT: 16 U/L (ref 0–44)
AST: 17 U/L (ref 15–41)
Albumin: 2.6 g/dL — ABNORMAL LOW (ref 3.5–5.0)
Alkaline Phosphatase: 80 U/L (ref 38–126)
Anion gap: 15 (ref 5–15)
BUN: 56 mg/dL — ABNORMAL HIGH (ref 6–20)
CO2: 16 mmol/L — ABNORMAL LOW (ref 22–32)
Calcium: 8.4 mg/dL — ABNORMAL LOW (ref 8.9–10.3)
Chloride: 107 mmol/L (ref 98–111)
Creatinine, Ser: 3.11 mg/dL — ABNORMAL HIGH (ref 0.61–1.24)
GFR, Estimated: 22 mL/min — ABNORMAL LOW (ref >60)
Glucose, Bld: 85 mg/dL (ref 70–99)
Potassium: 3.9 mmol/L (ref 3.5–5.1)
Sodium: 138 mmol/L (ref 135–145)
Total Bilirubin: 0.5 mg/dL (ref 0.3–1.2)
Total Protein: 8.3 g/dL — ABNORMAL HIGH (ref 6.5–8.1)

## 2022-01-15 LAB — MAGNESIUM: Magnesium: 1 mg/dL — ABNORMAL LOW (ref 1.7–2.4)

## 2022-01-15 LAB — APTT: aPTT: 33 seconds (ref 24–36)

## 2022-01-15 LAB — URIC ACID: Uric Acid, Serum: 14.9 mg/dL — ABNORMAL HIGH (ref 3.7–8.6)

## 2022-01-15 LAB — PROTIME-INR
INR: 1.2 (ref 0.8–1.2)
Prothrombin Time: 14.6 seconds (ref 11.4–15.2)

## 2022-01-15 LAB — RESP PANEL BY RT-PCR (FLU A&B, COVID) ARPGX2
Influenza A by PCR: NEGATIVE
Influenza B by PCR: NEGATIVE
SARS Coronavirus 2 by RT PCR: NEGATIVE

## 2022-01-15 LAB — LACTIC ACID, PLASMA
Lactic Acid, Venous: 1.3 mmol/L (ref 0.5–1.9)
Lactic Acid, Venous: 1 mmol/L (ref 0.5–1.9)

## 2022-01-15 MED ORDER — MAGNESIUM SULFATE 2 GM/50ML IV SOLN
2.0000 g | Freq: Once | INTRAVENOUS | Status: AC
  Administered 2022-01-16: 2 g via INTRAVENOUS
  Filled 2022-01-15: qty 50

## 2022-01-15 MED ORDER — LACTATED RINGERS IV SOLN: INTRAVENOUS | Status: AC

## 2022-01-15 MED ORDER — BENZOCAINE 10 % MT GEL
1.0000 "application " | Freq: Every day | OROMUCOSAL | Status: DC | PRN
  Administered 2022-01-16: 1 via OROMUCOSAL
  Filled 2022-01-15 (×3): qty 9

## 2022-01-15 MED ORDER — ONDANSETRON HCL 4 MG/2ML IJ SOLN
4.0000 mg | Freq: Once | INTRAMUSCULAR | Status: AC
  Administered 2022-01-15: 4 mg via INTRAVENOUS
  Filled 2022-01-15: qty 2

## 2022-01-15 MED ORDER — SODIUM CHLORIDE 0.9 % IV SOLN
2.0000 g | Freq: Two times a day (BID) | INTRAVENOUS | Status: DC
  Administered 2022-01-16: 2 g via INTRAVENOUS
  Filled 2022-01-15: qty 12.5

## 2022-01-15 MED ORDER — LACTATED RINGERS IV BOLUS
1500.0000 mL | Freq: Once | INTRAVENOUS | Status: AC
  Administered 2022-01-15: 1500 mL via INTRAVENOUS

## 2022-01-15 MED ORDER — VANCOMYCIN HCL 2000 MG/400ML IV SOLN
2000.0000 mg | Freq: Once | INTRAVENOUS | Status: AC
  Administered 2022-01-15: 2000 mg via INTRAVENOUS
  Filled 2022-01-15: qty 400

## 2022-01-15 MED ORDER — LUBIPROSTONE 24 MCG PO CAPS
24.0000 ug | ORAL_CAPSULE | Freq: Two times a day (BID) | ORAL | Status: DC
  Administered 2022-01-16 – 2022-02-12 (×50): 24 ug via ORAL
  Filled 2022-01-15 (×56): qty 1

## 2022-01-15 MED ORDER — VANCOMYCIN VARIABLE DOSE PER UNSTABLE RENAL FUNCTION (PHARMACIST DOSING): Status: DC

## 2022-01-15 MED ORDER — FLUCONAZOLE 150 MG PO TABS
150.0000 mg | ORAL_TABLET | ORAL | Status: AC
  Administered 2022-01-16: 150 mg via ORAL
  Filled 2022-01-15: qty 1

## 2022-01-15 MED ORDER — CLOTRIMAZOLE 1 % EX CREA
TOPICAL_CREAM | Freq: Two times a day (BID) | CUTANEOUS | Status: DC
  Administered 2022-01-19 – 2022-02-06 (×2): 1 via TOPICAL
  Filled 2022-01-15 (×3): qty 15

## 2022-01-15 MED ORDER — SODIUM CHLORIDE 0.9 % IV SOLN: 2.0000 g | Freq: Two times a day (BID) | INTRAVENOUS | Status: DC

## 2022-01-15 MED ORDER — MORPHINE SULFATE (PF) 4 MG/ML IV SOLN
4.0000 mg | Freq: Once | INTRAVENOUS | Status: AC
  Administered 2022-01-15: 4 mg via INTRAVENOUS
  Filled 2022-01-15: qty 1

## 2022-01-15 MED ORDER — SODIUM CHLORIDE 0.9 % IV SOLN
2.0000 g | Freq: Once | INTRAVENOUS | Status: AC
  Administered 2022-01-15: 2 g via INTRAVENOUS
  Filled 2022-01-15: qty 12.5

## 2022-01-15 MED ORDER — METHADONE HCL 10 MG PO TABS
50.0000 mg | ORAL_TABLET | Freq: Every day | ORAL | Status: DC
  Administered 2022-01-15 – 2022-02-12 (×29): 50 mg via ORAL
  Filled 2022-01-15 (×10): qty 5
  Filled 2022-01-15: qty 10
  Filled 2022-01-15 (×24): qty 5

## 2022-01-15 MED ORDER — ACETAMINOPHEN 650 MG RE SUPP: 650.0000 mg | Freq: Four times a day (QID) | RECTAL | Status: DC | PRN

## 2022-01-15 MED ORDER — METRONIDAZOLE 500 MG/100ML IV SOLN
500.0000 mg | Freq: Once | INTRAVENOUS | Status: AC
  Administered 2022-01-15: 500 mg via INTRAVENOUS
  Filled 2022-01-15: qty 100

## 2022-01-15 MED ORDER — VANCOMYCIN HCL IN DEXTROSE 1-5 GM/200ML-% IV SOLN: 1000.0000 mg | Freq: Once | INTRAVENOUS | Status: DC

## 2022-01-15 MED ORDER — ONDANSETRON HCL 4 MG PO TABS
4.0000 mg | ORAL_TABLET | Freq: Four times a day (QID) | ORAL | Status: DC | PRN
Start: 2022-01-15 — End: 2022-02-12

## 2022-01-15 MED ORDER — ONDANSETRON HCL 4 MG/2ML IJ SOLN
4.0000 mg | Freq: Four times a day (QID) | INTRAMUSCULAR | Status: DC | PRN
  Administered 2022-01-26 – 2022-01-27 (×2): 4 mg via INTRAVENOUS
  Filled 2022-01-15 (×6): qty 2

## 2022-01-15 MED ORDER — FAMOTIDINE 20 MG PO TABS
20.0000 mg | ORAL_TABLET | Freq: Every day | ORAL | Status: DC
  Administered 2022-01-16 – 2022-02-12 (×28): 20 mg via ORAL
  Filled 2022-01-15 (×28): qty 1

## 2022-01-15 MED ORDER — ASPIRIN EC 81 MG PO TBEC
81.0000 mg | DELAYED_RELEASE_TABLET | Freq: Every morning | ORAL | Status: DC
  Administered 2022-01-16 – 2022-02-12 (×28): 81 mg via ORAL
  Filled 2022-01-15 (×28): qty 1

## 2022-01-15 MED ORDER — METRONIDAZOLE 500 MG PO TABS
500.0000 mg | ORAL_TABLET | Freq: Three times a day (TID) | ORAL | Status: DC
  Administered 2022-01-15 – 2022-01-16 (×2): 500 mg via ORAL
  Filled 2022-01-15 (×2): qty 1

## 2022-01-15 MED ORDER — ACETAMINOPHEN 325 MG PO TABS
650.0000 mg | ORAL_TABLET | Freq: Four times a day (QID) | ORAL | Status: DC | PRN
  Administered 2022-01-24: 650 mg via ORAL
  Filled 2022-01-15 (×5): qty 2

## 2022-01-15 MED ORDER — SENNOSIDES-DOCUSATE SODIUM 8.6-50 MG PO TABS
1.0000 | ORAL_TABLET | Freq: Every evening | ORAL | Status: DC | PRN
  Administered 2022-01-20: 1 via ORAL
  Filled 2022-01-15: qty 1

## 2022-01-15 MED ORDER — RIVAROXABAN 10 MG PO TABS
10.0000 mg | ORAL_TABLET | Freq: Every day | ORAL | Status: DC
  Administered 2022-01-15 – 2022-02-12 (×29): 10 mg via ORAL
  Filled 2022-01-15 (×29): qty 1

## 2022-01-15 MED ORDER — LACTATED RINGERS IV BOLUS
1000.0000 mL | Freq: Once | INTRAVENOUS | Status: AC
  Administered 2022-01-15: 1000 mL via INTRAVENOUS

## 2022-01-15 NOTE — ED Provider Notes (Signed)
Ultrasound ED Peripheral IV (Provider) ? ?Date/Time: 01/15/2022 7:22 PM ?Performed by: Tedd Sias, PA ?Authorized by: Tedd Sias, PA  ? ?Procedure details:  ?  Indications: multiple failed IV attempts   ?  Skin Prep: chlorhexidine gluconate   ?  Location:  Right AC ?  Angiocath:  20 G ?  Bedside Ultrasound Guided: Yes   ?  Images: not archived   ?  Patient tolerated procedure without complications: Yes   ?  Dressing applied: Yes   ?Comments:  ?   1 attempt  ? ?  ?Tedd Sias, [place] ?01/15/22 1924 ? ?  ?Blanchie Dessert, MD ?01/20/22 1527 ? ?

## 2022-01-15 NOTE — Progress Notes (Signed)
Pharmacy Antibiotic Note ? ?[name] is a 56 y.o. male admitted on 01/15/2022 with  sepsis 2/2 unknown source .  Pharmacy has been consulted for Cefepime and vancomycin dosing. ? ?WBC wnl, SCr significantly elevated.  ? ?Plan: ?-Cefepime 2 gm IV Q 12 hours  ?-Vancomycin 2 gm IV load followed by dosing per levels  ?-Monitor CBC, renal fx, cultures and clinical progress ? ?Height: 6' (182.9 cm) ?Weight: (!) 139.7 kg (308 lb) ?IBW/kg (Calculated) : 77.6 ? ?Temp (24hrs), Avg:98.6 ?F (37 ?C), Min:98.6 ?F (37 ?C), Max:98.6 ?F (37 ?C) ? ?No results for input(s): WBC, CREATININE, LATICACIDVEN, VANCOTROUGH, VANCOPEAK, VANCORANDOM, GENTTROUGH, GENTPEAK, GENTRANDOM, TOBRATROUGH, TOBRAPEAK, TOBRARND, AMIKACINPEAK, AMIKACINTROU, AMIKACIN in the last 168 hours.  ?Estimated Creatinine Clearance: 59.2 mL/min (A) (by C-G formula based on SCr of 1.97 mg/dL (H)).   ? ?Allergies  ?Allergen Reactions  ? Pork-Derived Products Other (See Comments)  ?  Don't eat pork  ? ? ?Antimicrobials this admission: ?Cefepime 4/16 >>  ?Vancomycin 4/16 >>  ? ?Dose adjustments this admission: ? ? ?Microbiology results: ?4/16 BCx:  ?4/16 UCx:   ? ? ?Thank you for allowing pharmacy to be a part of this patient?s care. ? ?Albertina Parr, PharmD., BCCCP ?Clinical Pharmacist ?Please refer to [facility] for unit-specific pharmacist  ? ? ?

## 2022-01-15 NOTE — ED Notes (Signed)
EDP unable to establish IV access with [place]  ?

## 2022-01-15 NOTE — ED Notes (Signed)
Attempt made to contact IV team via telephone ? ?
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
## 2022-01-15 NOTE — Sepsis Progress Note (Signed)
Sepsis protocol is being followed by [facility]. 

## 2022-01-15 NOTE — Sepsis Progress Note (Signed)
Secure chat with bedside nurse regarding fluid requirement based off of ideal body weight. Pt is very hard stick and there are challenges with how much fluid can infuse at one time. Fluids will be given as soon as possible. ?

## 2022-01-15 NOTE — ED Triage Notes (Signed)
Pt arrives via [facility] from home for c/o chronic wound to buttocks now having increasing drainage, odor, chills, and malaise. Pt is paralyzed from old L1 injury. Chronic indwelling foley catheter and colostomy to LUQ.  ? ? ?
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
## 2022-01-15 NOTE — Hospital Course (Addendum)
Patient reports tolerating wound care well. We discuss his care here and plan for discharge at large. He denies acute concerns or complaints today. ? ? ? ?_________________________________________ ?He was informed by Dr. Collene Gobble that he was going to get him an appointment but he did not receive any appointment. ?Felt something was wrong last Monday with loss of appetite, whole body hurting. States he can tell when something is wrong. States he told his PCP he has an infection in his wounds. Wound was draining pus. He report associated chills, decreased po intake. Denies any dysuria, SOB, chest pain ?His aide stayed with him last night and noticed he was "going off" and screaming in pain. Aide called ambulance today.  ? ?

## 2022-01-15 NOTE — ED Notes (Signed)
Unable to establish IV access by two RN's and US guide. MD [place] notified ?

## 2022-01-15 NOTE — ED Notes (Signed)
Pt's urine bag changed.  ?

## 2022-01-16 DIAGNOSIS — Q549 Hypospadias, unspecified: Secondary | ICD-10-CM | POA: Diagnosis not present

## 2022-01-16 DIAGNOSIS — Z933 Colostomy status: Secondary | ICD-10-CM | POA: Diagnosis not present

## 2022-01-16 DIAGNOSIS — Y846 Urinary catheterization as the cause of abnormal reaction of the patient, or of later complication, without mention of misadventure at the time of the procedure: Secondary | ICD-10-CM | POA: Diagnosis present

## 2022-01-16 DIAGNOSIS — K5909 Other constipation: Secondary | ICD-10-CM | POA: Diagnosis present

## 2022-01-16 DIAGNOSIS — Z7189 Other specified counseling: Secondary | ICD-10-CM | POA: Diagnosis not present

## 2022-01-16 DIAGNOSIS — D509 Iron deficiency anemia, unspecified: Secondary | ICD-10-CM | POA: Diagnosis present

## 2022-01-16 DIAGNOSIS — G8929 Other chronic pain: Secondary | ICD-10-CM | POA: Diagnosis present

## 2022-01-16 DIAGNOSIS — M8668 Other chronic osteomyelitis, other site: Secondary | ICD-10-CM | POA: Diagnosis not present

## 2022-01-16 DIAGNOSIS — N390 Urinary tract infection, site not specified: Secondary | ICD-10-CM

## 2022-01-16 DIAGNOSIS — I10 Essential (primary) hypertension: Secondary | ICD-10-CM | POA: Diagnosis not present

## 2022-01-16 DIAGNOSIS — I700 Atherosclerosis of aorta: Secondary | ICD-10-CM | POA: Diagnosis not present

## 2022-01-16 DIAGNOSIS — M79605 Pain in left leg: Secondary | ICD-10-CM | POA: Diagnosis not present

## 2022-01-16 DIAGNOSIS — A419 Sepsis, unspecified organism: Secondary | ICD-10-CM | POA: Diagnosis not present

## 2022-01-16 DIAGNOSIS — G4733 Obstructive sleep apnea (adult) (pediatric): Secondary | ICD-10-CM | POA: Diagnosis not present

## 2022-01-16 DIAGNOSIS — Z7401 Bed confinement status: Secondary | ICD-10-CM | POA: Diagnosis not present

## 2022-01-16 DIAGNOSIS — W3400XS Accidental discharge from unspecified firearms or gun, sequela: Secondary | ICD-10-CM | POA: Diagnosis not present

## 2022-01-16 DIAGNOSIS — M19032 Primary osteoarthritis, left wrist: Secondary | ICD-10-CM | POA: Diagnosis not present

## 2022-01-16 DIAGNOSIS — K219 Gastro-esophageal reflux disease without esophagitis: Secondary | ICD-10-CM | POA: Diagnosis present

## 2022-01-16 DIAGNOSIS — M109 Gout, unspecified: Secondary | ICD-10-CM | POA: Diagnosis present

## 2022-01-16 DIAGNOSIS — G8220 Paraplegia, unspecified: Secondary | ICD-10-CM | POA: Diagnosis not present

## 2022-01-16 DIAGNOSIS — Z20822 Contact with and (suspected) exposure to covid-19: Secondary | ICD-10-CM | POA: Diagnosis not present

## 2022-01-16 DIAGNOSIS — Z7689 Persons encountering health services in other specified circumstances: Secondary | ICD-10-CM | POA: Diagnosis not present

## 2022-01-16 DIAGNOSIS — Y738 Miscellaneous gastroenterology and urology devices associated with adverse incidents, not elsewhere classified: Secondary | ICD-10-CM | POA: Diagnosis present

## 2022-01-16 DIAGNOSIS — M25551 Pain in right hip: Secondary | ICD-10-CM | POA: Diagnosis not present

## 2022-01-16 DIAGNOSIS — T83518A Infection and inflammatory reaction due to other urinary catheter, initial encounter: Secondary | ICD-10-CM | POA: Diagnosis not present

## 2022-01-16 DIAGNOSIS — L304 Erythema intertrigo: Secondary | ICD-10-CM | POA: Diagnosis present

## 2022-01-16 DIAGNOSIS — Q642 Congenital posterior urethral valves: Secondary | ICD-10-CM | POA: Diagnosis not present

## 2022-01-16 DIAGNOSIS — Z515 Encounter for palliative care: Secondary | ICD-10-CM | POA: Diagnosis not present

## 2022-01-16 DIAGNOSIS — M85432 Solitary bone cyst, left ulna and radius: Secondary | ICD-10-CM | POA: Diagnosis not present

## 2022-01-16 DIAGNOSIS — L89314 Pressure ulcer of right buttock, stage 4: Secondary | ICD-10-CM | POA: Diagnosis not present

## 2022-01-16 DIAGNOSIS — I959 Hypotension, unspecified: Secondary | ICD-10-CM | POA: Diagnosis present

## 2022-01-16 DIAGNOSIS — Z6841 Body Mass Index (BMI) 40.0 and over, adult: Secondary | ICD-10-CM | POA: Diagnosis not present

## 2022-01-16 DIAGNOSIS — K592 Neurogenic bowel, not elsewhere classified: Secondary | ICD-10-CM | POA: Diagnosis not present

## 2022-01-16 DIAGNOSIS — M79604 Pain in right leg: Secondary | ICD-10-CM | POA: Diagnosis not present

## 2022-01-16 DIAGNOSIS — R531 Weakness: Secondary | ICD-10-CM | POA: Diagnosis not present

## 2022-01-16 DIAGNOSIS — T148XXA Other injury of unspecified body region, initial encounter: Secondary | ICD-10-CM | POA: Diagnosis not present

## 2022-01-16 DIAGNOSIS — M1611 Unilateral primary osteoarthritis, right hip: Secondary | ICD-10-CM | POA: Diagnosis present

## 2022-01-16 DIAGNOSIS — N319 Neuromuscular dysfunction of bladder, unspecified: Secondary | ICD-10-CM | POA: Diagnosis not present

## 2022-01-16 DIAGNOSIS — N179 Acute kidney failure, unspecified: Secondary | ICD-10-CM | POA: Diagnosis not present

## 2022-01-16 DIAGNOSIS — D638 Anemia in other chronic diseases classified elsewhere: Secondary | ICD-10-CM | POA: Diagnosis not present

## 2022-01-16 DIAGNOSIS — L089 Local infection of the skin and subcutaneous tissue, unspecified: Secondary | ICD-10-CM | POA: Diagnosis not present

## 2022-01-16 DIAGNOSIS — E1169 Type 2 diabetes mellitus with other specified complication: Secondary | ICD-10-CM | POA: Diagnosis not present

## 2022-01-16 DIAGNOSIS — L89154 Pressure ulcer of sacral region, stage 4: Secondary | ICD-10-CM | POA: Diagnosis not present

## 2022-01-16 DIAGNOSIS — M25532 Pain in left wrist: Secondary | ICD-10-CM | POA: Diagnosis present

## 2022-01-16 LAB — BASIC METABOLIC PANEL
Anion gap: 8 (ref 5–15)
BUN: 49 mg/dL — ABNORMAL HIGH (ref 6–20)
CO2: 18 mmol/L — ABNORMAL LOW (ref 22–32)
Calcium: 7.9 mg/dL — ABNORMAL LOW (ref 8.9–10.3)
Chloride: 114 mmol/L — ABNORMAL HIGH (ref 98–111)
Creatinine, Ser: 2.28 mg/dL — ABNORMAL HIGH (ref 0.61–1.24)
GFR, Estimated: 32 mL/min — ABNORMAL LOW (ref >60)
Glucose, Bld: 100 mg/dL — ABNORMAL HIGH (ref 70–99)
Potassium: 3.8 mmol/L (ref 3.5–5.1)
Sodium: 140 mmol/L (ref 135–145)

## 2022-01-16 LAB — CBC
HCT: 25 % — ABNORMAL LOW (ref 39.0–52.0)
Hemoglobin: 7.3 g/dL — ABNORMAL LOW (ref 13.0–17.0)
MCH: 26.5 pg (ref 26.0–34.0)
MCHC: 29.2 g/dL — ABNORMAL LOW (ref 30.0–36.0)
MCV: 90.9 fL (ref 80.0–100.0)
Platelets: 300 10*3/uL (ref 150–400)
RBC: 2.75 MIL/uL — ABNORMAL LOW (ref 4.22–5.81)
RDW: 15 % (ref 11.5–15.5)
WBC: 6.6 10*3/uL (ref 4.0–10.5)
nRBC: 0 % (ref 0.0–0.2)

## 2022-01-16 LAB — PHOSPHORUS: Phosphorus: 4.1 mg/dL (ref 2.5–4.6)

## 2022-01-16 LAB — MAGNESIUM: Magnesium: 2.1 mg/dL (ref 1.7–2.4)

## 2022-01-16 MED ORDER — LACTATED RINGERS IV SOLN: INTRAVENOUS | Status: AC

## 2022-01-16 MED ORDER — CHLORHEXIDINE GLUCONATE CLOTH 2 % EX PADS
6.0000 | MEDICATED_PAD | Freq: Every day | CUTANEOUS | Status: DC
Start: 2022-01-17 — End: 2022-02-12
  Administered 2022-01-18 – 2022-02-11 (×24): 6 via TOPICAL

## 2022-01-16 MED ORDER — SODIUM CHLORIDE 0.9 % IV SOLN: 2.0000 g | INTRAVENOUS | Status: DC

## 2022-01-16 MED ORDER — SODIUM CHLORIDE 0.9 % IV SOLN
2.0000 g | INTRAVENOUS | Status: DC
  Administered 2022-01-16 – 2022-01-18 (×3): 2 g via INTRAVENOUS
  Filled 2022-01-16 (×3): qty 20

## 2022-01-16 MED ORDER — CHLORHEXIDINE GLUCONATE CLOTH 2 % EX PADS: 6.0000 | MEDICATED_PAD | Freq: Every day | CUTANEOUS | Status: DC
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
## 2022-01-16 NOTE — Consult Note (Addendum)
WOC Nurse Consult Note: ?Patient is well known to our service from multiple past admissions. Last seen by this writer during admission in March 2023. See my note dated 12/21/21. He has not been seen in the outpatient [facility] in some time. Visit conducted remotely following review of the patient record, including photographs. ? ?[facility] Nurse ostomy consult note ?Stoma type/location:Colostomy ?Stomal assessment/size: Not seen today. Three weeks ago, stoma measured 1 and 5/8 inches oval, pale pink with lumen in center. ?Peristomal assessment: Not seen today ?Treatment options for stomal/peristomal skin: Skin barrier ring ?Output : brown soft stool ?Ostomy pouching: 2pc. 2 and 3/4 inch ostomy pouching system with skin barrier ring ?Education provided: None ?Enrolled patient in [place] program: No. Patient is established with a supplier, but often runs out of supplies. ? ?WOC Nurse Consult Note: ?Reason for Consult:Pressure injuries over rotational flap, buttocks, ischial tuberosities,  ?Wound type:Pressure, moisture ?Pressure Injury POA: Yes ?Measurement:Not measured today ?Wound bed:Red, moist ?Drainage (amount, consistency, odor) Large amounts of serous to serosanguinous exudate, odor ?Periwound: with scarring ?Dressing procedure/placement/frequency: ? ?I have provided a bariatric mattress replacement with low air loss feature and guidance for topical wound care using a silver hydrofiber ([employer] Sheliah Hatch # [id number]). Patient will not allow nursing (or [place] nurse) to "tuck" much less "fill" defects with dressing, telling us it is too painful. This is to be performed daily and PRN drainage strike through onto exterior of dressing. Soap and water cleanse is appropriate, rinse with NS prior to care.  ? ?Turning and repositioning includes keeping the HOB at or below a 30 degree angle and minimizing time in the supine position. ? ?[place] nursing team will not follow, but will remain available to this  patient, the nursing and medical teams.  Please re-consult if needed. ?Thanks, ?Maudie Flakes, MSN, RN, [place], [place], CWON-AP, [place]  ?Pager# [phone]  ? ?  ? ? ?  ?

## 2022-01-17 ENCOUNTER — Inpatient Hospital Stay (HOSPITAL_COMMUNITY): Payer: Medicare Other

## 2022-01-17 DIAGNOSIS — M79605 Pain in left leg: Secondary | ICD-10-CM | POA: Diagnosis not present

## 2022-01-17 DIAGNOSIS — N390 Urinary tract infection, site not specified: Secondary | ICD-10-CM | POA: Diagnosis not present

## 2022-01-17 DIAGNOSIS — M79604 Pain in right leg: Secondary | ICD-10-CM | POA: Diagnosis not present

## 2022-01-17 LAB — BASIC METABOLIC PANEL
Anion gap: 11 (ref 5–15)
BUN: 36 mg/dL — ABNORMAL HIGH (ref 6–20)
CO2: 20 mmol/L — ABNORMAL LOW (ref 22–32)
Calcium: 7.8 mg/dL — ABNORMAL LOW (ref 8.9–10.3)
Chloride: 110 mmol/L (ref 98–111)
Creatinine, Ser: 1.85 mg/dL — ABNORMAL HIGH (ref 0.61–1.24)
GFR, Estimated: 42 mL/min — ABNORMAL LOW (ref >60)
Glucose, Bld: 90 mg/dL (ref 70–99)
Potassium: 3.8 mmol/L (ref 3.5–5.1)
Sodium: 141 mmol/L (ref 135–145)

## 2022-01-17 LAB — CBC
HCT: 26 % — ABNORMAL LOW (ref 39.0–52.0)
Hemoglobin: 7.6 g/dL — ABNORMAL LOW (ref 13.0–17.0)
MCH: 26.1 pg (ref 26.0–34.0)
MCHC: 29.2 g/dL — ABNORMAL LOW (ref 30.0–36.0)
MCV: 89.3 fL (ref 80.0–100.0)
Platelets: 297 10*3/uL (ref 150–400)
RBC: 2.91 MIL/uL — ABNORMAL LOW (ref 4.22–5.81)
RDW: 15 % (ref 11.5–15.5)
WBC: 6.6 10*3/uL (ref 4.0–10.5)
nRBC: 0 % (ref 0.0–0.2)

## 2022-01-17 LAB — VANCOMYCIN, RANDOM: Vancomycin Rm: 17

## 2022-01-17 MED ORDER — LACTATED RINGERS IV SOLN: INTRAVENOUS | Status: DC

## 2022-01-17 MED ORDER — ENSURE ENLIVE PO LIQD
237.0000 mL | Freq: Two times a day (BID) | ORAL | Status: DC
  Administered 2022-01-17 – 2022-01-25 (×11): 237 mL via ORAL

## 2022-01-17 MED ORDER — VANCOMYCIN HCL 1500 MG/300ML IV SOLN
1500.0000 mg | Freq: Once | INTRAVENOUS | Status: AC
  Administered 2022-01-17: 1500 mg via INTRAVENOUS
  Filled 2022-01-17: qty 300

## 2022-01-17 MED ORDER — ADULT MULTIVITAMIN W/MINERALS CH
1.0000 | ORAL_TABLET | Freq: Every day | ORAL | Status: DC
  Administered 2022-01-17 – 2022-02-12 (×27): 1 via ORAL
  Filled 2022-01-17 (×26): qty 1

## 2022-01-17 MED ORDER — SODIUM CHLORIDE 0.9 % IV SOLN: INTRAVENOUS | Status: AC
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
## 2022-01-17 NOTE — Progress Notes (Addendum)
OT Cancellation Note ? ?Patient Details ?Name: [name] ?MRN: [id number] ?DOB: [birth year] ? ? ?Cancelled Treatment:    Reason Eval/Treat Not Completed: Pain limiting ability to participate Pt reporting new, significant R hip pain. Will follow-up for OT eval as schedule permits. ? ?Addendum: discussed with PT, RN and treatment team. With acute R hip pain and hx of osteoporosis, will await initiation of OT eval until imaging of R hip completed.  ? ?[name] ?01/17/2022, 9:35 AM ?

## 2022-01-17 NOTE — Progress Notes (Addendum)
Pharmacy Antibiotic Note ? ?[name] is a 56 y.o. male admitted on 01/15/2022 with  sepsis 2/2 unknown source .  Pharmacy has been consulted for Cefepime and vancomycin dosing. ? ?WBC wnl, SCr significantly elevated on admission but now decreasing to 1.85. Vancomycin random level at ~32 hours post dose is 17 mcg/ml. Will redose x 1 at get 24 hour level tomorrow.  ? ?Plan: ?-Continue Ceftriaxone 2gm Iv q24h ?-Vancomycin '1500mg'$  IV x 1 followed by dosing per levels  ?-Monitor CBC, renal fx, cultures and clinical progress ? ?Height: 6' (182.9 cm) ?Weight: (!) 139.7 kg (308 lb) ?IBW/kg (Calculated) : 77.6 ? ?Temp (24hrs), Avg:98.1 ?F (36.7 ?C), Min:97.7 ?F (36.5 ?C), Max:98.4 ?F (36.9 ?C) ? ?Recent Labs  ?Lab 01/15/22 ?1526 01/15/22 ?2105 01/16/22 ?0420 01/17/22 ?0357  ?WBC 8.4  --  6.6 6.6  ?CREATININE 3.11*  --  2.28* 1.85*  ?LATICACIDVEN 1.3 1.0  --   --   ?VANCORANDOM  --   --   --  42  ?  ?Estimated Creatinine Clearance: 63 mL/min (A) (by C-G formula based on SCr of 1.85 mg/dL (H)).   ? ?Allergies  ?Allergen Reactions  ? Pork-Derived Products Other (See Comments)  ?  Don't eat pork  ? ? ?Antimicrobials this admission: ?Cefepime 4/16 >>  ?Vancomycin 4/16 >>  ? ?Dose adjustments this admission: ? ? ?Microbiology results: ?4/16 BCx: NGTD ?4/16 UCx:  NGTD ? ? ?Dejai Schubach A. Levada Dy, PharmD, BCPS, FNKF ?Clinical Pharmacist ?[facility] [place] ?Please utilize Amion for appropriate phone number to reach the unit pharmacist ([facility]) ? ? ? ?

## 2022-01-17 NOTE — Progress Notes (Signed)
ABI has been completed. ? ? ?Results can be found under chart review under CV PROC. ?01/17/2022 4:07 PM ?Desirie Minteer RVT, RDMS ? ?

## 2022-01-17 NOTE — Progress Notes (Signed)
PT Cancellation Note ? ?Patient Details ?Name: [name] ?MRN: [id number] ?DOB: 05/10/1966 ? ? ?Cancelled Treatment:    Reason Eval/Treat Not Completed: Other (comment) ? ?Order received, chart reviewed;  ?Discussed pt status with OT, RN;  ?Pt describes new R hip pain that is acute enough to limit his movement and activity tolerance;  ?Without a clear mechanism of injury, considering a fracture isn't glaringly obvious; Still, in the setting of longstanding SCI, paraperesis, and osteoperosis, imaging to rule in/out a fracture is not unreasonable either;  ?Discussed with Dr. Vinetta Bergamo, and she will consider imaging;  ? ?Will follow up later today as time allows;  ?Otherwise, will follow up for PT tomorrow;  ? ?Thank you,  ?Roney Marion, PT  ?Acute Rehabilitation Services ?Pager [phone] ?Office [phone] ? ? ? ?Colletta [place] ?01/17/2022, 11:07 AM ?
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
## 2022-01-18 ENCOUNTER — Inpatient Hospital Stay (HOSPITAL_COMMUNITY): Payer: Medicare Other

## 2022-01-18 DIAGNOSIS — N390 Urinary tract infection, site not specified: Secondary | ICD-10-CM | POA: Diagnosis not present

## 2022-01-18 LAB — CBC
HCT: 27.3 % — ABNORMAL LOW (ref 39.0–52.0)
Hemoglobin: 7.9 g/dL — ABNORMAL LOW (ref 13.0–17.0)
MCH: 26 pg (ref 26.0–34.0)
MCHC: 28.9 g/dL — ABNORMAL LOW (ref 30.0–36.0)
MCV: 89.8 fL (ref 80.0–100.0)
Platelets: 313 10*3/uL (ref 150–400)
RBC: 3.04 MIL/uL — ABNORMAL LOW (ref 4.22–5.81)
RDW: 15.4 % (ref 11.5–15.5)
WBC: 8 10*3/uL (ref 4.0–10.5)
nRBC: 0.4 % — ABNORMAL HIGH (ref 0.0–0.2)

## 2022-01-18 LAB — VANCOMYCIN, RANDOM: Vancomycin Rm: 29

## 2022-01-18 LAB — BASIC METABOLIC PANEL
Anion gap: 7 (ref 5–15)
BUN: 28 mg/dL — ABNORMAL HIGH (ref 6–20)
CO2: 21 mmol/L — ABNORMAL LOW (ref 22–32)
Calcium: 8 mg/dL — ABNORMAL LOW (ref 8.9–10.3)
Chloride: 114 mmol/L — ABNORMAL HIGH (ref 98–111)
Creatinine, Ser: 1.56 mg/dL — ABNORMAL HIGH (ref 0.61–1.24)
GFR, Estimated: 51 mL/min — ABNORMAL LOW (ref >60)
Glucose, Bld: 93 mg/dL (ref 70–99)
Potassium: 4 mmol/L (ref 3.5–5.1)
Sodium: 142 mmol/L (ref 135–145)

## 2022-01-18 MED ORDER — SODIUM CHLORIDE 0.9 % IV SOLN: INTRAVENOUS | Status: AC

## 2022-01-18 MED ORDER — HYDROCERIN EX CREA
TOPICAL_CREAM | Freq: Two times a day (BID) | CUTANEOUS | Status: DC
Start: 2022-01-18 — End: 2022-02-12
  Administered 2022-01-19 – 2022-02-06 (×5): 1 via TOPICAL
  Filled 2022-01-18: qty 113

## 2022-01-18 NOTE — Progress Notes (Signed)
OT Cancellation Note ? ?Patient Details ?Name: [name] ?MRN: [id number] ?DOB: April 20, 1966 ? ? ?Cancelled Treatment:    Reason Eval/Treat Not Completed: Medical issues which prohibited therapy Awaiting imaging of R hip prior to engaging in therapy evals.  ? ?Layla Maw ?01/18/2022, 7:56 AM ?

## 2022-01-18 NOTE — Progress Notes (Signed)
Wound care done by RN. Sacral wound was cleansed with soap and water, rinsed with normal saline. Wound was packed with Aquacel Ag to part of the wound. Patient couldn't tolerate having the rest of the wound packed. Large amounts of malodorous drainage.  ?

## 2022-01-18 NOTE — Progress Notes (Signed)
PT Cancellation Note ? ?Patient Details ?Name: [name] ?MRN: [id number] ?DOB: [birth year] ? ? ?Cancelled Treatment:    Reason Eval/Treat Not Completed: Other (comment) Awaiting imaging of R hip prior to engaging in therapy evals.  ? ?Vannary Greening A. Gilford Rile, PT, DPT ?Acute Rehabilitation Services ?Pager [phone] ?Office [phone] ? ? ? ?Donte Lenzo A Laureano Hetzer ?01/18/2022, 11:00 AM ?
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
## 2022-01-18 NOTE — Progress Notes (Addendum)
Pharmacy Antibiotic Note ? ?[name] is a 56 y.o. male admitted on 01/15/2022 with  sepsis 2/2 unknown source .  Pharmacy has been consulted for Cefepime and vancomycin dosing. ? ?WBC wnl, SCr significantly elevated on admission but now decreasing to 1.85>>1.56. Vancomycin random level at ~24 hours post dose is 29 mcg/ml. Will hold on redosing get 36 hour level tomorrow. Stop date 4/22 per MD note ? ?Plan: ?-Continue Ceftriaxone 2gm Iv q24h ?-Vancomycin per levels once level <20 ?-Level tomorrow at 0000 ?-Monitor CBC, renal fx, cultures and clinical progress ? ?Height: 6' (182.9 cm) ?Weight: (!) 139.7 kg (308 lb) ?IBW/kg (Calculated) : 77.6 ? ?Temp (24hrs), Avg:98.1 ?F (36.7 ?C), Min:97.9 ?F (36.6 ?C), Max:98.5 ?F (36.9 ?C) ? ?Recent Labs  ?Lab 01/15/22 ?1526 01/15/22 ?2105 01/16/22 ?0420 01/17/22 ?0357 01/18/22 ?1941 01/18/22 ?1150  ?WBC 8.4  --  6.6 6.6 8.0  --   ?CREATININE 3.11*  --  2.28* 1.85* 1.56*  --   ?LATICACIDVEN 1.3 1.0  --   --   --   --   ?VANCORANDOM  --   --   --  56  --  29  ? ?  ?Estimated Creatinine Clearance: 74.8 mL/min (A) (by C-G formula based on SCr of 1.56 mg/dL (H)).   ? ?Allergies  ?Allergen Reactions  ? Pork-Derived Products Other (See Comments)  ?  Don't eat pork  ? ? ?Antimicrobials this admission: ?Cefepime 4/16 >>4/17 ?Ceftiraxone 4/17  ?Vancomycin 4/16 >>  ? ?Dose adjustments this admission: ? ? ?Microbiology results: ?4/16 BCx: NGTD ?4/16 UCx:  NGTD ? ? ?Shakiyla Kook A. Levada Dy, PharmD, BCPS, FNKF ?Clinical Pharmacist ?[place] ?Please utilize Amion for appropriate phone number to reach the unit pharmacist ([place]) ? ? ? ?

## 2022-01-19 ENCOUNTER — Other Ambulatory Visit: Payer: Self-pay | Admitting: Internal Medicine

## 2022-01-19 DIAGNOSIS — G8220 Paraplegia, unspecified: Secondary | ICD-10-CM

## 2022-01-19 DIAGNOSIS — N390 Urinary tract infection, site not specified: Secondary | ICD-10-CM | POA: Diagnosis not present

## 2022-01-19 LAB — BASIC METABOLIC PANEL
Anion gap: 7 (ref 5–15)
BUN: 22 mg/dL — ABNORMAL HIGH (ref 6–20)
CO2: 19 mmol/L — ABNORMAL LOW (ref 22–32)
Calcium: 7.9 mg/dL — ABNORMAL LOW (ref 8.9–10.3)
Chloride: 116 mmol/L — ABNORMAL HIGH (ref 98–111)
Creatinine, Ser: 1.26 mg/dL — ABNORMAL HIGH (ref 0.61–1.24)
GFR, Estimated: >60 mL/min (ref >60)
Glucose, Bld: 96 mg/dL (ref 70–99)
Potassium: 3.9 mmol/L (ref 3.5–5.1)
Sodium: 142 mmol/L (ref 135–145)

## 2022-01-19 LAB — CBC
HCT: 24.2 % — ABNORMAL LOW (ref 39.0–52.0)
Hemoglobin: 7 g/dL — ABNORMAL LOW (ref 13.0–17.0)
MCH: 26.4 pg (ref 26.0–34.0)
MCHC: 28.9 g/dL — ABNORMAL LOW (ref 30.0–36.0)
MCV: 91.3 fL (ref 80.0–100.0)
Platelets: 305 10*3/uL (ref 150–400)
RBC: 2.65 MIL/uL — ABNORMAL LOW (ref 4.22–5.81)
RDW: 15.6 % — ABNORMAL HIGH (ref 11.5–15.5)
WBC: 7.5 10*3/uL (ref 4.0–10.5)
nRBC: 0.5 % — ABNORMAL HIGH (ref 0.0–0.2)

## 2022-01-19 LAB — URINE CULTURE: Culture: >=100000 — AB

## 2022-01-19 LAB — HEMOGLOBIN AND HEMATOCRIT, BLOOD
HCT: 28.8 % — ABNORMAL LOW (ref 39.0–52.0)
Hemoglobin: 8.6 g/dL — ABNORMAL LOW (ref 13.0–17.0)

## 2022-01-19 LAB — PREPARE RBC (CROSSMATCH)

## 2022-01-19 LAB — VANCOMYCIN, RANDOM: Vancomycin Rm: 20

## 2022-01-19 MED ORDER — POLYETHYLENE GLYCOL 3350 17 G PO PACK
17.0000 g | PACK | Freq: Two times a day (BID) | ORAL | Status: DC
  Administered 2022-01-19 – 2022-02-12 (×46): 17 g via ORAL
  Filled 2022-01-19 (×48): qty 1

## 2022-01-19 MED ORDER — VANCOMYCIN HCL 1500 MG/300ML IV SOLN
1500.0000 mg | Freq: Once | INTRAVENOUS | Status: DC
  Filled 2022-01-19: qty 300

## 2022-01-19 MED ORDER — LEVOFLOXACIN 500 MG PO TABS: 750.0000 mg | ORAL_TABLET | Freq: Every day | ORAL | Status: DC

## 2022-01-19 MED ORDER — LEVOFLOXACIN 500 MG PO TABS
750.0000 mg | ORAL_TABLET | Freq: Every day | ORAL | Status: AC
  Administered 2022-01-19 – 2022-01-27 (×9): 750 mg via ORAL
  Filled 2022-01-19 (×9): qty 2

## 2022-01-19 MED ORDER — SODIUM CHLORIDE 0.9% IV SOLUTION: Freq: Once | INTRAVENOUS | Status: AC

## 2022-01-19 NOTE — Care Management Important Message (Signed)
Important Message ? ?Patient Details  ?Name: [name] ?MRN: [id number] ?Date of Birth: December 04, 1965 ? ? ?Medicare Important Message Given:  Yes ? ? ? ? ?[name] ?01/19/2022, 2:00 PM ?

## 2022-01-19 NOTE — Progress Notes (Signed)
Pharmacy Antibiotic Note ? ?[name] is a 56 y.o. male admitted on 01/15/2022 with  sepsis 2/2 unknown source .  Pharmacy has been consulted for vancomycin dosing.  Stop date 4/22 per MD note ?Vancomycin levels drawn today to assess clearance as SCr has improved.   ? ?Plan: ?Vancomycin 1500 mg IV today at 10 am.  No further doses necessary as dose will provide 48 hours of therapeutic levels. ? ?Height: 6' (182.9 cm) ?Weight: (!) 139.7 kg (308 lb) ?IBW/kg (Calculated) : 77.6 ? ?Temp (24hrs), Avg:98 ?F (36.7 ?C), Min:97.9 ?F (36.6 ?C), Max:98.2 ?F (36.8 ?C) ? ?Recent Labs  ?Lab 01/15/22 ?1526 01/15/22 ?2105 01/16/22 ?0420 01/17/22 ?0357 01/17/22 ?3428 01/18/22 ?7681 01/18/22 ?1150 01/19/22 ?0030  ?WBC 8.4  --  6.6 6.6  --  8.0  --  7.5  ?CREATININE 3.11*  --  2.28* 1.85*  --  1.56*  --  1.26*  ?LATICACIDVEN 1.3 1.0  --   --   --   --   --   --   ?VANCORANDOM  --   --   --  17   < >  --  29 20  ? < > = values in this interval not displayed.  ? ?  ?Estimated Creatinine Clearance: 92.6 mL/min (A) (by C-G formula based on SCr of 1.26 mg/dL (H)).   ? ?Allergies  ?Allergen Reactions  ? Pork-Derived Products Other (See Comments)  ?  Don't eat pork  ? ? ?Antimicrobials this admission: ?Cefepime 4/16 >>4/17 ?Ceftiraxone 4/17  ?Vancomycin 4/16 >>  ? ?Dose adjustments this admission: ? ? ?Microbiology results: ?4/16 BCx: NGTD ?4/16 UCx:  NGTD ? ? ?Phillis Knack, PharmD, BCPS  ? ? ? ?
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
## 2022-01-19 NOTE — Progress Notes (Signed)
Patient stated he can place himself on CPAP when ready. CPAP is at bedside within reach of patient. RT instructed patient to have RN call RT if assistance is needed. RT will monitor as needed. ?

## 2022-01-19 NOTE — TOC Initial Note (Signed)
Transition of Care (TOC) - Initial/Assessment Note  ? ? ?Patient Details  ?Name: [name] ?MRN: [id number] ?Date of Birth: [birth year] ? ?Transition of Care (TOC) CM/SW Contact:    ?Tom-Johnson, Renea Ee, RN ?Phone Number: ?01/19/2022, 4:05 PM ? ?Clinical Narrative:                 ? ?CM consulted for home health PT/OT. CM spoke with patient at bedside and gave list of agencies and he states he has no preference. CM sent referral to these agencies: ?[facility]- Declined due to patient was active with their services before but was non compliant with treatment, disrespectful to staff and chronic wound will not be covered by insurance. ?[facility] patient due to staffing. ?[facility]- Declined due to being at capacity for [facility] referrals at this time. ?[facility]- No RN available for wound care in patient's area of residence. ?[facility]- No response ?[facility]- Not able to accept at this time. ? ?CM notified MD and PT reassessed patient and recommended PT at [facility]. Patient does not have 3 ICU days and therefore is not eligible for LTACH. ?CM received a call from [place], [place] with [facility] [phone]) to get an update on patient. CM notified Amy of the difficulty getting home health for patient and she states to go ahead with [facility] referral, she will work on getting it approved.  ?CM and SW spoke with patient at bedside about LTACH recommendations, choices given of LTACH facilities and [facility] chose [facility]. CM notified Anderson Malta with [facility] of referral and the conversation with Amy about getting patient approved. Anderson Malta notified her supervisor and they will followup on referral. Awaiting their response at this time.   ?CM will continue to follow with needs.  ? ?  ?Barriers to Discharge: No Home Care Agency will accept this patient ? ? ?Patient Goals and CMS Choice ?Patient states their goals for this hospitalization and ongoing recovery are:: To get better and return home ?CMS Medicare.gov Compare Post Acute Care list  provided to:: Patient ?Choice offered to / list presented to : Patient ? ?Expected Discharge Plan and Services ?  ?  ?Discharge Planning Services: CM Consult ?Post Acute Care Choice: Home Health, Long Term Acute Care (LTAC) ?Living arrangements for the past 2 months: Apartment ?                ?  ?  ?  ?  ?  ?  ?  ?  ?  ?  ? ?Prior Living Arrangements/Services ?Living arrangements for the past 2 months: Apartment ?Lives with:: Siblings (Brother) ?Patient language and need for interpreter reviewed:: Yes ?Do you feel safe going back to the place where you live?: Yes      ?Need for Family Participation in Patient Care: Yes (Comment) ?Care giver support system in place?: Yes (comment) ?Current home services: Biomedical scientist (Personal) ?Criminal Activity/Legal Involvement Pertinent to Current Situation/Hospitalization: No - Comment as needed ? ?Activities of Daily Living ?  ?  ? ?Permission Sought/Granted ?Permission sought to share information with : Case Manager, Customer service manager, Family Supports ?Permission granted to share information with : Yes, Verbal Permission Granted ?   ?   ?   ?   ? ?Emotional Assessment ?Appearance:: Appears stated age ?Attitude/Demeanor/Rapport: Engaged ?Affect (typically observed): Frustrated, Hopeful ?Orientation: : Oriented to Self, Oriented to Place, Oriented to  Time, Oriented to Situation ?Alcohol / Substance Use: Not Applicable ?Psych Involvement: No (comment) ? ?Admission diagnosis:  Wound infection [T14.8XXA, L08.9] ?Complicated UTI (urinary  tract infection) [N39.0] ?Sepsis, due to unspecified organism, unspecified whether acute organ dysfunction present ([place]) [A41.9] ?Patient Active Problem List  ? Diagnosis Date Noted  ? Complicated UTI (urinary tract infection) 01/15/2022  ? Gout 12/30/2021  ? Morbid obesity with BMI of 40.0-44.9, adult ([place]) 12/22/2021  ? Intertriginous skin ulcer ([place]) 12/22/2021  ? Acute kidney injury ([place]) 12/20/2021  ? Small bowel obstruction  ([place]) 10/13/2021  ? Colon cancer screening 05/20/2021  ? Bloating 04/11/2021  ? Chronic indwelling Foley catheter 04/01/2021  ? Orthopnea 04/01/2021  ? Obstructive sleep apnea 02/24/2021  ? Anemia of chronic disease 02/24/2021  ? GERD (gastroesophageal reflux disease) 02/24/2021  ? Polysubstance abuse ([place]) 07/09/2019  ? Substance induced mood disorder ([place]) 08/21/2018  ? Therapeutic opioid-induced constipation (OIC) 02/14/2017  ? Decubitus ulcer of sacral region, stage 4 ([place])   ? UTI (urinary tract infection)   ? Colostomy in place for fecal diversion 06/01/2016  ? Protein-calorie malnutrition, severe ([place]) 05/31/2016  ? Chronic pain 05/26/2016  ? Hypertension 05/26/2016  ? Paraplegia ([place])   ? ?PCP:  [name], MD ?Pharmacy:   ?[place], [address] ?[place] [address] ?Phone: [phone] Fax: [phone] ? ?[place], [place] - [address] ?[place] [address] ?Phone: [phone] Fax: [phone] ? ? ? ? ?Social Determinants of Health (SDOH) Interventions ?  ? ?Readmission Risk Interventions ?   ? View : No data to display.  ?  ?  ?  ? ? ? ?

## 2022-01-19 NOTE — Progress Notes (Signed)
error 

## 2022-01-19 NOTE — Progress Notes (Signed)
Patient placed on CPAP at this time.  
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
## 2022-01-20 ENCOUNTER — Ambulatory Visit: Payer: Medicare Other | Admitting: Licensed Clinical Social Worker

## 2022-01-20 DIAGNOSIS — N390 Urinary tract infection, site not specified: Secondary | ICD-10-CM | POA: Diagnosis not present

## 2022-01-20 LAB — CBC
HCT: 26.8 % — ABNORMAL LOW (ref 39.0–52.0)
Hemoglobin: 8 g/dL — ABNORMAL LOW (ref 13.0–17.0)
MCH: 26.8 pg (ref 26.0–34.0)
MCHC: 29.9 g/dL — ABNORMAL LOW (ref 30.0–36.0)
MCV: 89.6 fL (ref 80.0–100.0)
Platelets: 259 10*3/uL (ref 150–400)
RBC: 2.99 MIL/uL — ABNORMAL LOW (ref 4.22–5.81)
RDW: 15.9 % — ABNORMAL HIGH (ref 11.5–15.5)
WBC: 9.3 10*3/uL (ref 4.0–10.5)
nRBC: 0.5 % — ABNORMAL HIGH (ref 0.0–0.2)

## 2022-01-20 LAB — BASIC METABOLIC PANEL
Anion gap: 7 (ref 5–15)
BUN: 16 mg/dL (ref 6–20)
CO2: 20 mmol/L — ABNORMAL LOW (ref 22–32)
Calcium: 7.9 mg/dL — ABNORMAL LOW (ref 8.9–10.3)
Chloride: 116 mmol/L — ABNORMAL HIGH (ref 98–111)
Creatinine, Ser: 1.09 mg/dL (ref 0.61–1.24)
GFR, Estimated: >60 mL/min (ref >60)
Glucose, Bld: 83 mg/dL (ref 70–99)
Potassium: 4.3 mmol/L (ref 3.5–5.1)
Sodium: 143 mmol/L (ref 135–145)

## 2022-01-20 LAB — CULTURE, BLOOD (ROUTINE X 2)
Culture: NO GROWTH
Culture: NO GROWTH

## 2022-01-20 LAB — TYPE AND SCREEN
ABO/RH(D): B POS
Antibody Screen: NEGATIVE
Unit division: 0

## 2022-01-20 LAB — BPAM RBC
Blood Product Expiration Date: 202305052359
ISSUE DATE / TIME: 202304201214
Unit Type and Rh: 7300

## 2022-01-20 NOTE — Patient Instructions (Signed)
Visit Information ? ?Instructions:  ? ?Patient was given the following information about care management and care coordination services today, agreed to services, and gave verbal consent: 1.care management/care coordination services include personalized support from designated clinical staff supervised by their physician, including individualized plan of care and coordination with other care providers 2. 24/7 contact phone numbers for assistance for urgent and routine care needs. 3. The patient may stop care management/care coordination services at any time by phone call to the office staff. ? ?Patient verbalizes understanding of instructions and care plan provided today and agrees to view in MyChart. Active MyChart status confirmed with patient.   ? ?No further follow up required: . ? ?[name], BSW  ?Social Worker ?[facility] Care Management  ?[phone] ?  ? ?  ?

## 2022-01-20 NOTE — Chronic Care Management (AMB) (Signed)
?  Care Management  ? ?Social Work Visit Note ? ?01/20/2022 ?Name: [name] MRN: [id number] DOB: Apr 19, 1966 ? ?[name] is a 56 y.o. year old male who sees [name], MD for primary care. The care management team was consulted for assistance with care management and care coordination needs related to [facility]   ? ?Patient was given the following information about care management and care coordination services today, agreed to services, and gave verbal consent: 1.care management/care coordination services include personalized support from designated clinical staff supervised by their physician, including individualized plan of care and coordination with other care providers 2. 24/7 contact phone numbers for assistance for urgent and routine care needs. 3. The patient may stop care management/care coordination services at any time by phone call to the office staff. ? ?Engaged with patient by telephone for follow up visit in response to provider referral for social work chronic care management and care coordination services. ? ?Assessment: Review of patient history, allergies, and health status during evaluation of patient need for care management/care coordination services.   ? ?Interventions:  ?Patient interviewed and appropriate assessments performed ?Collaborated with clinical team regarding patient needs  ?Patient currently in hospital. Patient express frustration with not receiving a schedule appointment for the clinic prior to being hospitalized. Clinic was closed during the easter holiday. SW assessed patient for any additional needs. Patient advised no.  ?Patient advised he is in the hospital with no anticipated date of discharge. ?Patient advised SW services not needed at this time. SW gave patient contact information. ? ?SDOH (Social Determinants of Health) assessments performed: Yes ?   ? ?Plan:  ?No follow up needed. ? ?Milus Height, BSW  ?Social Worker ?[facility] Care Management   ?[phone] ?  ? ? ? ? ? ? ? ? ? ? ? ? ? ? ?

## 2022-01-20 NOTE — Progress Notes (Signed)
Pt states he can place himself on CPAP when ready for bed. CPAP placed within pt's reach, and pt advised to notify for RT if any further assistance is needed.  ?
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
## 2022-01-21 DIAGNOSIS — N390 Urinary tract infection, site not specified: Secondary | ICD-10-CM | POA: Diagnosis not present

## 2022-01-21 LAB — BASIC METABOLIC PANEL
Anion gap: 6 (ref 5–15)
BUN: 15 mg/dL (ref 6–20)
CO2: 23 mmol/L (ref 22–32)
Calcium: 7.8 mg/dL — ABNORMAL LOW (ref 8.9–10.3)
Chloride: 112 mmol/L — ABNORMAL HIGH (ref 98–111)
Creatinine, Ser: 1.2 mg/dL (ref 0.61–1.24)
GFR, Estimated: >60 mL/min (ref >60)
Glucose, Bld: 99 mg/dL (ref 70–99)
Potassium: 4.1 mmol/L (ref 3.5–5.1)
Sodium: 141 mmol/L (ref 135–145)

## 2022-01-21 LAB — CBC
HCT: 26.9 % — ABNORMAL LOW (ref 39.0–52.0)
Hemoglobin: 8.3 g/dL — ABNORMAL LOW (ref 13.0–17.0)
MCH: 27.2 pg (ref 26.0–34.0)
MCHC: 30.9 g/dL (ref 30.0–36.0)
MCV: 88.2 fL (ref 80.0–100.0)
Platelets: 262 10*3/uL (ref 150–400)
RBC: 3.05 MIL/uL — ABNORMAL LOW (ref 4.22–5.81)
RDW: 15.9 % — ABNORMAL HIGH (ref 11.5–15.5)
WBC: 8.5 10*3/uL (ref 4.0–10.5)
nRBC: 0.5 % — ABNORMAL HIGH (ref 0.0–0.2)

## 2022-01-21 MED ORDER — SENNOSIDES-DOCUSATE SODIUM 8.6-50 MG PO TABS
2.0000 | ORAL_TABLET | Freq: Two times a day (BID) | ORAL | Status: DC
  Administered 2022-01-21 – 2022-02-12 (×39): 2 via ORAL
  Filled 2022-01-21 (×45): qty 2

## 2022-01-21 MED ORDER — SENNOSIDES-DOCUSATE SODIUM 8.6-50 MG PO TABS: 2.0000 | ORAL_TABLET | Freq: Every evening | ORAL | Status: DC | PRN

## 2022-01-21 NOTE — TOC CM/SW Note (Signed)
Spoke with Anderson Malta with [facility] who reports [facility] supervisor is waiting to speak with [facility] insurance regarding admission details. Should know more Monday since insurance is closed over the weekend. Made MD aware. ? ? ?Marthenia Rolling, MSN, RN,BSN ?[facility]patient [facility] Case Manager ?[phone]   ?

## 2022-01-21 NOTE — Plan of Care (Signed)
?  Problem: Clinical Measurements: ?Goal: Respiratory complications will improve ?Outcome: Progressing ?  ?Problem: Clinical Measurements: ?Goal: Cardiovascular complication will be avoided ?Outcome: Progressing ?  ?Problem: Activity: ?Goal: Risk for activity intolerance will decrease ?Outcome: Not Progressing ?  ?
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
## 2022-01-22 LAB — CBC
HCT: 27.5 % — ABNORMAL LOW (ref 39.0–52.0)
Hemoglobin: 8.1 g/dL — ABNORMAL LOW (ref 13.0–17.0)
MCH: 26.6 pg (ref 26.0–34.0)
MCHC: 29.5 g/dL — ABNORMAL LOW (ref 30.0–36.0)
MCV: 90.2 fL (ref 80.0–100.0)
Platelets: 274 10*3/uL (ref 150–400)
RBC: 3.05 MIL/uL — ABNORMAL LOW (ref 4.22–5.81)
RDW: 15.9 % — ABNORMAL HIGH (ref 11.5–15.5)
WBC: 8.9 10*3/uL (ref 4.0–10.5)
nRBC: 0.3 % — ABNORMAL HIGH (ref 0.0–0.2)

## 2022-01-22 MED ORDER — MAGNESIUM HYDROXIDE 400 MG/5ML PO SUSP: 30.0000 mL | Freq: Once | ORAL | Status: DC

## 2022-01-22 MED ORDER — MAGNESIUM HYDROXIDE 400 MG/5ML PO SUSP: 30.0000 mL | Freq: Once | ORAL | Status: DC | PRN

## 2022-01-22 NOTE — Plan of Care (Signed)
  Problem: Clinical Measurements: Goal: Respiratory complications will improve Outcome: Progressing   Problem: Clinical Measurements: Goal: Cardiovascular complication will be avoided Outcome: Progressing   
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
## 2022-01-22 NOTE — Progress Notes (Signed)
Patient did not want to do dressing change due to not taking methodone due to not passing stool.  Michela Pitcher would be in too much pain ?

## 2022-01-22 NOTE — Progress Notes (Signed)
Pt stated he can place himself on CPAP.  2L 02 of bleed in. CPAP placed at pt's reach.  ?

## 2022-01-22 NOTE — Progress Notes (Incomplete)
Patient now passing stool to colostomy ?

## 2022-01-23 LAB — CBC
HCT: 25.7 % — ABNORMAL LOW (ref 39.0–52.0)
Hemoglobin: 7.8 g/dL — ABNORMAL LOW (ref 13.0–17.0)
MCH: 27.1 pg (ref 26.0–34.0)
MCHC: 30.4 g/dL (ref 30.0–36.0)
MCV: 89.2 fL (ref 80.0–100.0)
Platelets: 268 10*3/uL (ref 150–400)
RBC: 2.88 MIL/uL — ABNORMAL LOW (ref 4.22–5.81)
RDW: 16 % — ABNORMAL HIGH (ref 11.5–15.5)
WBC: 8.2 10*3/uL (ref 4.0–10.5)
nRBC: 0 % (ref 0.0–0.2)

## 2022-01-23 MED ORDER — LACTATED RINGERS IV BOLUS
1000.0000 mL | Freq: Once | INTRAVENOUS | Status: AC
  Administered 2022-01-23: 1000 mL via INTRAVENOUS

## 2022-01-23 MED ORDER — FERROUS SULFATE 325 (65 FE) MG PO TABS
325.0000 mg | ORAL_TABLET | Freq: Every day | ORAL | Status: DC
Start: 2022-01-24 — End: 2022-02-12
  Administered 2022-01-24 – 2022-02-12 (×20): 325 mg via ORAL
  Filled 2022-01-23 (×21): qty 1
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
## 2022-01-23 NOTE — Progress Notes (Signed)
Pt is comfortable placing self on CPAP. 2L 02 bleed in. CPAP placed within pt's reach.  ?
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
## 2022-01-24 DIAGNOSIS — N390 Urinary tract infection, site not specified: Secondary | ICD-10-CM | POA: Diagnosis not present

## 2022-01-24 LAB — CBC
HCT: 26.2 % — ABNORMAL LOW (ref 39.0–52.0)
Hemoglobin: 7.9 g/dL — ABNORMAL LOW (ref 13.0–17.0)
MCH: 26.9 pg (ref 26.0–34.0)
MCHC: 30.2 g/dL (ref 30.0–36.0)
MCV: 89.1 fL (ref 80.0–100.0)
Platelets: 270 10*3/uL (ref 150–400)
RBC: 2.94 MIL/uL — ABNORMAL LOW (ref 4.22–5.81)
RDW: 16 % — ABNORMAL HIGH (ref 11.5–15.5)
WBC: 8.4 10*3/uL (ref 4.0–10.5)
nRBC: 0 % (ref 0.0–0.2)

## 2022-01-24 MED ORDER — ORAL CARE MOUTH RINSE
15.0000 mL | Freq: Two times a day (BID) | OROMUCOSAL | Status: DC
  Administered 2022-01-24 – 2022-02-05 (×22): 15 mL via OROMUCOSAL

## 2022-01-24 MED ORDER — ACETAMINOPHEN 500 MG PO TABS
1000.0000 mg | ORAL_TABLET | Freq: Three times a day (TID) | ORAL | Status: DC | PRN
  Administered 2022-01-24 – 2022-02-01 (×10): 1000 mg via ORAL
  Filled 2022-01-24 (×12): qty 2

## 2022-01-24 MED ORDER — ACETAMINOPHEN 650 MG RE SUPP: 650.0000 mg | Freq: Four times a day (QID) | RECTAL | Status: DC | PRN

## 2022-01-24 MED ORDER — COLCHICINE 0.6 MG PO TABS
0.6000 mg | ORAL_TABLET | Freq: Once | ORAL | Status: AC
  Administered 2022-01-25: 0.6 mg via ORAL
  Filled 2022-01-24: qty 1

## 2022-01-24 MED ORDER — COLCHICINE 0.6 MG PO TABS
1.2000 mg | ORAL_TABLET | Freq: Once | ORAL | Status: AC
  Administered 2022-01-24: 1.2 mg via ORAL
  Filled 2022-01-24: qty 2

## 2022-01-24 MED ORDER — CHLORHEXIDINE GLUCONATE 0.12 % MT SOLN
15.0000 mL | Freq: Two times a day (BID) | OROMUCOSAL | Status: DC
  Administered 2022-01-24 – 2022-02-06 (×26): 15 mL via OROMUCOSAL
  Filled 2022-01-24 (×27): qty 15

## 2022-01-24 MED ORDER — COLCHICINE 0.6 MG PO TABS
0.6000 mg | ORAL_TABLET | Freq: Once | ORAL | Status: AC
  Administered 2022-01-24: 0.6 mg via ORAL
  Filled 2022-01-24: qty 1

## 2022-01-24 NOTE — TOC Progression Note (Addendum)
Transition of Care (TOC) - Progression Note  ? ? ?Patient Details  ?Name: [name] ?MRN: [id number] ?Date of Birth: [birth year] ? ?Transition of Care (TOC) CM/SW Contact  ?Tom-Johnson, Renea Ee, RN ?Phone Number: ?01/24/2022, 4:03 PM ? ?Clinical Narrative:    ? ?CM spoke with Benjamine Mola, Discharge Care Manager at [facility] about replacing patient's air mattress at home. CM also notified Benjamine Mola about all the [facility] denials from home health agencies which causes barriers to discharge. Elizabeth sent a list of all the agencies that are in network. CM had called all the agencies on the list that are within distance and they had declined.  ?CM spoke with Cristie Hem Intake Coordinator with [employer] [phone]) and patient's demographics with H&P and wound care orders faxed [phone]) with successful notice received. CM also faxed demographics to [facility] and [facility]. Awaiting their response. ?CM will continue to follow with needs. ? ?  ?Barriers to Discharge: No Home Care Agency will accept this patient ? ?Expected Discharge Plan and Services ?  ?  ?Discharge Planning Services: CM Consult ?Post Acute Care Choice: Home Health, Long Term Acute Care (LTAC) ?Living arrangements for the past 2 months: Apartment ?                ?  ?  ?  ?  ?  ?  ?  ?  ?  ?  ? ? ?Social Determinants of Health (SDOH) Interventions ?  ? ?Readmission Risk Interventions ?   ? View : No data to display.  ?  ?  ?  ? ? ?
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
## 2022-01-24 NOTE — Progress Notes (Signed)
Pt says he feels like his gout is flaring up in his left wrist. Reports history of gout flare in left wrist in the past and feels the same way now. Left wrist slightly swollen and tender to touch. Pt states he is left handed.  ?Discussed with night MD and colchicine given per order. Also given tylenol and ice pack placed to wrist.  ?
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
## 2022-01-25 LAB — CBC
HCT: 25.5 % — ABNORMAL LOW (ref 39.0–52.0)
Hemoglobin: 7.9 g/dL — ABNORMAL LOW (ref 13.0–17.0)
MCH: 27.4 pg (ref 26.0–34.0)
MCHC: 31 g/dL (ref 30.0–36.0)
MCV: 88.5 fL (ref 80.0–100.0)
Platelets: 278 10*3/uL (ref 150–400)
RBC: 2.88 MIL/uL — ABNORMAL LOW (ref 4.22–5.81)
RDW: 16.4 % — ABNORMAL HIGH (ref 11.5–15.5)
WBC: 7.5 10*3/uL (ref 4.0–10.5)
nRBC: 0 % (ref 0.0–0.2)

## 2022-01-25 MED ORDER — ZINC SULFATE 220 (50 ZN) MG PO CAPS
220.0000 mg | ORAL_CAPSULE | Freq: Every day | ORAL | Status: AC
  Administered 2022-01-25 – 2022-02-07 (×14): 220 mg via ORAL
  Filled 2022-01-25 (×14): qty 1

## 2022-01-25 MED ORDER — JUVEN PO PACK
1.0000 | PACK | Freq: Two times a day (BID) | ORAL | Status: DC
  Administered 2022-01-26 – 2022-02-12 (×34): 1 via ORAL
  Filled 2022-01-25 (×32): qty 1

## 2022-01-25 MED ORDER — ENSURE ENLIVE PO LIQD
237.0000 mL | Freq: Two times a day (BID) | ORAL | Status: DC
  Administered 2022-01-25 – 2022-02-11 (×32): 237 mL via ORAL

## 2022-01-25 MED ORDER — ASCORBIC ACID 500 MG PO TABS
500.0000 mg | ORAL_TABLET | Freq: Two times a day (BID) | ORAL | Status: DC
  Administered 2022-01-25 – 2022-02-12 (×36): 500 mg via ORAL
  Filled 2022-01-25 (×36): qty 1

## 2022-01-25 NOTE — Progress Notes (Signed)
Physical Therapy Note ? ?Pt needs post-acute rehab at SNF level to maximize independence and safety with mobility and ADLs;  ?Additionally, he has specialized seating and wheelchair needs, that can be met at a post-acute rehab venue;  ? ? ? 01/25/22 1300  ? PT - Assessment/Plan  ?PT Plan Discharge plan needs to be updated  ?PT Visit Diagnosis Muscle weakness (generalized) (M62.81);Other abnormalities of gait and mobility (R26.89);Adult, failure to thrive (R62.7) ?(decreased skin integrity)  ?Follow Up Recommendations Skilled nursing-short term rehab (<3 hours/day)  ?Assistance recommended at discharge Frequent or constant Supervision/Assistance  ?Patient can return home with the following Two people to help with walking and/or transfers;A lot of help with bathing/dressing/bathroom;Assistance with cooking/housework;Assist for transportation  ?PT equipment Hospital bed ?(air mattress, hoyer lift)  ? ?Roney Marion, PT  ?Acute Rehabilitation Services ?Office [phone] ? ? ?
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
## 2022-01-26 DIAGNOSIS — N390 Urinary tract infection, site not specified: Secondary | ICD-10-CM | POA: Diagnosis not present

## 2022-01-26 LAB — CBC
HCT: 26.5 % — ABNORMAL LOW (ref 39.0–52.0)
Hemoglobin: 7.8 g/dL — ABNORMAL LOW (ref 13.0–17.0)
MCH: 26.4 pg (ref 26.0–34.0)
MCHC: 29.4 g/dL — ABNORMAL LOW (ref 30.0–36.0)
MCV: 89.8 fL (ref 80.0–100.0)
Platelets: 274 10*3/uL (ref 150–400)
RBC: 2.95 MIL/uL — ABNORMAL LOW (ref 4.22–5.81)
RDW: 16.3 % — ABNORMAL HIGH (ref 11.5–15.5)
WBC: 6.9 10*3/uL (ref 4.0–10.5)
nRBC: 0 % (ref 0.0–0.2)

## 2022-01-26 MED ORDER — MAGNESIUM HYDROXIDE 400 MG/5ML PO SUSP
30.0000 mL | Freq: Once | ORAL | Status: AC | PRN
  Administered 2022-01-26: 30 mL via ORAL
  Filled 2022-01-26: qty 30

## 2022-01-26 NOTE — Progress Notes (Signed)
No BM yet as of this time.Warm prune juice offered. ?

## 2022-01-26 NOTE — TOC Progression Note (Signed)
Transition of Care (TOC) - Initial/Assessment Note  ? ? ?Patient Details  ?Name: [name] ?MRN: [id number] ?Date of Birth: 1966-08-09 ? ?Transition of Care (TOC) CM/SW Contact:    ?Paulene Floor Jetaime Pinnix, LCSWA ?Phone Number: ?01/26/2022, 4:05 PM ? ?Clinical Narrative:                 ?CSW inquired about SNFs that would accept the patient on Methadone during QC Length of Stay meeting with [facility] leadership and was informed that there are no SNF options. ? ?  ?Barriers to Discharge: No Home Care Agency will accept this patient ? ? ?Patient Goals and CMS Choice ?Patient states their goals for this hospitalization and ongoing recovery are:: To get better and return home ?CMS Medicare.gov Compare Post Acute Care list provided to:: Patient ?Choice offered to / list presented to : Patient ? ?Expected Discharge Plan and Services ?  ?  ?Discharge Planning Services: CM Consult ?Post Acute Care Choice: Home Health, Long Term Acute Care (LTAC) ?Living arrangements for the past 2 months: Apartment ?                ?  ?  ?  ?  ?  ?  ?  ?  ?  ?  ? ?Prior Living Arrangements/Services ?Living arrangements for the past 2 months: Apartment ?Lives with:: Siblings (Brother) ?Patient language and need for interpreter reviewed:: Yes ?Do you feel safe going back to the place where you live?: Yes      ?Need for Family Participation in Patient Care: Yes (Comment) ?Care giver support system in place?: Yes (comment) ?Current home services: Biomedical scientist (Personal) ?Criminal Activity/Legal Involvement Pertinent to Current Situation/Hospitalization: No - Comment as needed ? ?Activities of Daily Living ?  ?  ? ?Permission Sought/Granted ?Permission sought to share information with : Case Manager, Customer service manager, Family Supports ?Permission granted to share information with : Yes, Verbal Permission Granted ?   ?   ?   ?   ? ?Emotional Assessment ?Appearance:: Appears stated age ?Attitude/Demeanor/Rapport: Engaged ?Affect (typically  observed): Frustrated, Hopeful ?Orientation: : Oriented to Self, Oriented to Place, Oriented to  Time, Oriented to Situation ?Alcohol / Substance Use: Not Applicable ?Psych Involvement: No (comment) ? ?Admission diagnosis:  Wound infection [T14.8XXA, L08.9] ?Complicated UTI (urinary tract infection) [N39.0] ?Sepsis, due to unspecified organism, unspecified whether acute organ dysfunction present ([place]) [A41.9] ?Patient Active Problem List  ? Diagnosis Date Noted  ? Complicated UTI (urinary tract infection) 01/15/2022  ? Gout 12/30/2021  ? Morbid obesity with BMI of 40.0-44.9, adult ([place]) 12/22/2021  ? Intertriginous skin ulcer ([place]) 12/22/2021  ? Acute kidney injury ([place]) 12/20/2021  ? Small bowel obstruction ([place]) 10/13/2021  ? Colon cancer screening 05/20/2021  ? Bloating 04/11/2021  ? Chronic indwelling Foley catheter 04/01/2021  ? Orthopnea 04/01/2021  ? Obstructive sleep apnea 02/24/2021  ? Anemia of chronic disease 02/24/2021  ? GERD (gastroesophageal reflux disease) 02/24/2021  ? Polysubstance abuse ([place]) 07/09/2019  ? Substance induced mood disorder ([place]) 08/21/2018  ? Therapeutic opioid-induced constipation ([facility]) 02/14/2017  ? Decubitus ulcer of sacral region, stage 4 ([place])   ? UTI (urinary tract infection)   ? Colostomy in place for fecal diversion 06/01/2016  ? Protein-calorie malnutrition, severe ([place]) 05/31/2016  ? Chronic pain 05/26/2016  ? Hypertension 05/26/2016  ? Paraplegia ([place])   ? ?PCP:  No primary care provider on file. ?Pharmacy:   ?[address] [place] ?  [place] [address] ?Phone: [phone] Fax: [phone] ? ?[place] [place] [facility] [address] ?[place] [address] ?Phone: [phone] Fax: [phone] ? ? ? ? ?Social Determinants of Health (SDOH) Interventions ?  ? ?Readmission Risk Interventions ?   ? View : No data to display.  ?  ?  ?  ? ? ? ?

## 2022-01-26 NOTE — Progress Notes (Signed)
Patient would like to do dressing change during daytime.  ?

## 2022-01-26 NOTE — Progress Notes (Signed)
Pt able to place CPAP mask himself when ready for bed. Pt to notify for RT if any further assistance is needed.  ?

## 2022-01-26 NOTE — TOC Progression Note (Addendum)
Transition of Care (TOC) - Progression Note  ? ? ?Patient Details  ?Name: Orvin Rusk ?MRN: [id number] ?Date of Birth: 1965/12/11 ? ?Transition of Care (TOC) CM/SW Contact  ?Tom-Johnson, Renea Ee, RN ?Phone Number: ?01/26/2022, 3:00 PM ? ?Clinical Narrative:    ? ?CM spoke with Amy at [facility] and she notified [employer] about patient's bed at home malfunction. CM called and spoke with Jodell Cipro and was notified that a technician will be sent to patient's resident to assess bed and mattress to see if it needs repair or Replacement. Equipment will be replaced if needed. Awaiting response . CM will continue to follow with needs.  ?15:30- CM received call from [place] that replaced equipment will be delivered to patient's residence today. MD notified. CM will continue to follow with needs. ?  ?Barriers to Discharge: No Home Care Agency will accept this patient ? ?Expected Discharge Plan and Services ?  ?  ?Discharge Planning Services: CM Consult ?Post Acute Care Choice: Home Health, Long Term Acute Care (LTAC) ?Living arrangements for the past 2 months: Apartment ?                ?  ?  ?  ?  ?  ?  ?  ?  ?  ?  ? ? ?Social Determinants of Health (SDOH) Interventions ?  ? ?Readmission Risk Interventions ?   ? View : No data to display.  ?  ?  ?  ? ? ?

## 2022-01-26 NOTE — Progress Notes (Signed)
PT Cancellation Note ? ?Patient Details ?Name: [name] ?MRN: [id number] ?DOB: [birth year] ? ? ?Cancelled Treatment:    Reason Eval/Treat Not Completed: Patient declined, no reason specified Patient declining therapy this date due to nausea, needing to have bowel movement, and L wrist soreness. Educated patient on importance of mobility and effects of immobility, patient continued to decline. PT will re-attempt at later date.  ? ?Labrittany Wechter A. Gilford Rile, PT, DPT ?Acute Rehabilitation Services ?Pager [phone] ?Office [phone] ? ? ? ?[name] A Jameika Kinn ?01/26/2022, 5:20 PM ?
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
## 2022-01-26 NOTE — Progress Notes (Signed)
Refused to have his dressing change on his sacral area,until he would have his bowel moved.Nurse explained to him that he has been refusing his Amitiza,patient's said '' is that what is for? Oh man,you are the only one ,who explained that to me.''. ?

## 2022-01-27 DIAGNOSIS — N390 Urinary tract infection, site not specified: Secondary | ICD-10-CM | POA: Diagnosis not present

## 2022-01-27 MED ORDER — MAGNESIUM HYDROXIDE 400 MG/5ML PO SUSP
30.0000 mL | Freq: Every day | ORAL | Status: DC
  Administered 2022-01-27 – 2022-02-12 (×17): 30 mL via ORAL
  Filled 2022-01-27 (×17): qty 30

## 2022-01-27 NOTE — Plan of Care (Signed)
?  Problem: Health Behavior/Discharge Planning: Goal: Ability to manage health-related needs will improve Outcome: Progressing   Problem: Clinical Measurements: Goal: Ability to maintain clinical measurements within normal limits will improve Outcome: Progressing   Problem: Activity: Goal: Risk for activity intolerance will decrease Outcome: Progressing   Problem: Nutrition: Goal: Adequate nutrition will be maintained Outcome: Progressing   

## 2022-01-27 NOTE — Plan of Care (Signed)
  Problem: Clinical Measurements: Goal: Respiratory complications will improve Outcome: Progressing   Problem: Clinical Measurements: Goal: Cardiovascular complication will be avoided Outcome: Progressing   

## 2022-01-27 NOTE — TOC Progression Note (Signed)
Transition of Care (TOC) - Progression Note  ? ? ?Patient Details  ?Name: [name] ?MRN: [id number] ?Date of Birth: October 31, 1965 ? ?Transition of Care (TOC) CM/SW Contact  ?[place], LCSW ?Phone Number: ?01/27/2022, 10:11 AM ? ?Clinical Narrative:    ? ?CSW is notified that RNCM was informed that [facility] SNF may be able to take pt on methadone. RNCM left message with Freda Munro [facility] admissions supervisor) requesting return call. CSW completed fl2 and faxed bed requests in hub. CSW also sent message to [facility] admissions liaison; awaiting response.  ? ?  ?Barriers to Discharge: No Home Care Agency will accept this patient ? ?Expected Discharge Plan and Services ?  ?  ?Discharge Planning Services: CM Consult ?Post Acute Care Choice: Home Health, Long Term Acute Care (LTAC) ?Living arrangements for the past 2 months: Apartment ?                ?  ?  ?  ?  ?  ?  ?  ?  ?  ?  ? ? ?Social Determinants of Health (SDOH) Interventions ?  ? ?Readmission Risk Interventions ?   ? View : No data to display.  ?  ?  ?  ? ? ?
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
## 2022-01-27 NOTE — [place] FL2 (Signed)
?[place] MEDICAID FL2 LEVEL OF CARE SCREENING TOOL  ?  ? ?IDENTIFICATION  ?Patient Name: ?[name] Birthdate: [birth year] Sex: male Admission Date (Current Location): ?01/15/2022  ?[place] and [place] Number: ? [place] ?  Facility and Address:  ?[facility], [place] [address], [place], [place] [address] ?     Provider Number: ?[id number]  ?Attending Physician Name and Address:  ?Sid Falcon, MD ? Relative Name and Phone Number:  ?Rosita Kea (Sister)   [phone] [facility] Phone) ?   ?Current Level of Care: ?Hospital Recommended Level of Care: ?[place] Prior Approval Number: ?  ? ?Date Approved/Denied: ?  PASRR Number: ?[id number] ? ?Discharge Plan: ?SNF ?  ? ?Current Diagnoses: ?Patient Active Problem List  ? Diagnosis Date Noted  ? Complicated UTI (urinary tract infection) 01/15/2022  ? Gout 12/30/2021  ? Morbid obesity with BMI of 40.0-44.9, adult ([place]) 12/22/2021  ? Intertriginous skin ulcer ([place]) 12/22/2021  ? Acute kidney injury ([place]) 12/20/2021  ? Small bowel obstruction ([place]) 10/13/2021  ? Colon cancer screening 05/20/2021  ? Bloating 04/11/2021  ? Chronic indwelling Foley catheter 04/01/2021  ? Orthopnea 04/01/2021  ? Obstructive sleep apnea 02/24/2021  ? Anemia of chronic disease 02/24/2021  ? GERD (gastroesophageal reflux disease) 02/24/2021  ? Polysubstance abuse ([place]) 07/09/2019  ? Substance induced mood disorder ([place]) 08/21/2018  ? Therapeutic opioid-induced constipation (OIC) 02/14/2017  ? Decubitus ulcer of sacral region, stage 4 ([place])   ? UTI (urinary tract infection)   ? Colostomy in place for fecal diversion 06/01/2016  ? Protein-calorie malnutrition, severe ([place]) 05/31/2016  ? Chronic pain 05/26/2016  ? Hypertension 05/26/2016  ? Paraplegia ([place])   ? ? ?Orientation RESPIRATION BLADDER Height & Weight   ?  ?Self, Time, Situation, Place ? Normal Indwelling catheter Weight: (!) 308 lb (139.7 kg) ?Height:  6' (182.9 cm)  ?BEHAVIORAL SYMPTOMS/MOOD  NEUROLOGICAL BOWEL NUTRITION STATUS  ?    Colostomy Diet (See d/c summary)  ?AMBULATORY STATUS COMMUNICATION OF NEEDS Skin   ?Extensive Assist Verbally PU Stage and Appropriate Care (Pressure injury, coccyx, medial, stage 4) ?  ?  ?  ?    ?     ?     ? ? ?Personal Care Assistance Level of Assistance  ?Bathing, Dressing, Feeding Bathing Assistance: Maximum assistance ?Feeding assistance: Independent ?Dressing Assistance: Maximum assistance ?   ? ?Functional Limitations Info  ?Sight, Hearing, Speech Sight Info: Impaired ?Hearing Info: Adequate ?Speech Info: Adequate  ? ? ?SPECIAL CARE FACTORS FREQUENCY  ?    ?  ?  ?  ?  ?  ?  ?   ? ? ?Contractures Contractures Info: Not present  ? ? ?Additional Factors Info  ?Code Status, Allergies Code Status Info: Full code ?Allergies Info: Pork-derived products ?  ?  ?  ?   ? ?Current Medications (01/27/2022):  This is the current hospital active medication list ?Current Facility-Administered Medications  ?Medication Dose Route Frequency Provider Last Rate Last Admin  ? acetaminophen (TYLENOL) tablet 1,000 mg  1,000 mg Oral TID PRN Orvis Brill, MD   1,000 mg at 01/27/22 6599  ? Or  ? acetaminophen (TYLENOL) suppository 650 mg  650 mg Rectal Q6H PRN Orvis Brill, MD      ? ascorbic acid (VITAMIN C) tablet 500 mg  500 mg Oral BID Campbell Riches, MD   500 mg at 01/26/22 2141  ? aspirin EC tablet 81 mg  81 mg Oral q morning Coy Saunas, Prosper  M, MD   81 mg at 01/26/22 0951  ? benzocaine (ORAJEL) 10 % mucosal gel 1 application.  1 application. Mouth/Throat Daily PRN Lacinda Axon, MD   1 application. at 01/16/22 2231  ? chlorhexidine (PERIDEX) 0.12 % solution 15 mL  15 mL Mouth Rinse BID Campbell Riches, MD   15 mL at 01/26/22 2140  ? Chlorhexidine Gluconate Cloth 2 % PADS 6 each  6 each Topical Daily Campbell Riches, MD   6 each at 01/25/22 1430  ? clotrimazole (LOTRIMIN) 1 % cream   Topical BID Lacinda Axon, MD   Given at 01/26/22 2140  ? famotidine  (PEPCID) tablet 20 mg  20 mg Oral Daily Lacinda Axon, MD   20 mg at 01/26/22 2458  ? feeding supplement (ENSURE ENLIVE / ENSURE PLUS) liquid 237 mL  237 mL Oral BID BM Campbell Riches, MD   237 mL at 01/26/22 0757  ? ferrous sulfate tablet 325 mg  325 mg Oral Q breakfast Wayland Denis, MD   325 mg at 01/27/22 0998  ? hydrocerin (EUCERIN) cream   Topical BID Wayland Denis, MD   Given at 01/26/22 2140  ? lubiprostone (AMITIZA) capsule 24 mcg  24 mcg Oral BID WC Lacinda Axon, MD   24 mcg at 01/27/22 3382  ? MEDLINE mouth rinse  15 mL Mouth Rinse q12n4p Campbell Riches, MD   15 mL at 01/26/22 1603  ? methadone (DOLOPHINE) tablet 50 mg  50 mg Oral Daily Lacinda Axon, MD   50 mg at 01/26/22 2138  ? multivitamin with minerals tablet 1 tablet  1 tablet Oral Daily Campbell Riches, MD   1 tablet at 01/26/22 5053  ? nutrition supplement (JUVEN) (JUVEN) powder packet 1 packet  1 packet Oral BID BM Campbell Riches, MD   1 packet at 01/26/22 1601  ? ondansetron (ZOFRAN) tablet 4 mg  4 mg Oral Q6H PRN Lacinda Axon, MD      ? Or  ? ondansetron [facility]) injection 4 mg  4 mg Intravenous Q6H PRN Lacinda Axon, MD   4 mg at 01/26/22 0836  ? polyethylene glycol (MIRALAX / GLYCOLAX) packet 17 g  17 g Oral BID Iona Beard, MD   17 g at 01/27/22 9767  ? rivaroxaban (XARELTO) tablet 10 mg  10 mg Oral Daily Lacinda Axon, MD   10 mg at 01/26/22 3419  ? senna-docusate (Senokot-S) tablet 2 tablet  2 tablet Oral BID Iona Beard, MD   2 tablet at 01/27/22 3790  ? zinc sulfate capsule 220 mg  220 mg Oral Daily Campbell Riches, MD   220 mg at 01/27/22 2409  ? ? ? ?Discharge Medications: ?Please see discharge summary for a list of discharge medications. ? ?Relevant Imaging Results: ? ?Relevant Lab Results: ? ? ?Additional Information ?SS#: [id number] ? ?[place], LCSW ? ? ? ? ?

## 2022-01-27 NOTE — TOC Progression Note (Signed)
Transition of Care (TOC) - Progression Note  ? ? ?Patient Details  ?Name: [name] ?MRN: [id number] ?Date of Birth: 23-Nov-1965 ? ?Transition of Care (TOC) CM/SW Contact  ?Tom-Johnson, Renea Ee, RN ?Phone Number: ?01/27/2022, 4:05 PM ? ?Clinical Narrative:    ? ?CM updated by [employer] that equipment was not delivered to patient's resident yesterday as scheduled d/t driver availability but they have his delivery pending for today. ?CM received a call from Amy with [facility] and notified CM that patient does not feel safe going home and agrees to go to SNF. Amy states she spoke with Rosario Adie at [facility] and CM should give her a call.  ?CM called and spoke with Freda Munro [phone]) and stated that she did not get full information about patient from Amy. Freda Munro states that patient's weight of 308 lbs and type of chronic wound does not qualify him as a candidate. States patient's wound care is a lot more than they can care for.  ?CM notified MD, Amy and SW and advised to send referral to facilities outside of [facility] per patient's request. Awaiting their response. TOC will continue to follow with needs. ? ?  ?Barriers to Discharge: No Home Care Agency will accept this patient ? ?Expected Discharge Plan and Services ?  ?  ?Discharge Planning Services: CM Consult ?Post Acute Care Choice: Home Health, Long Term Acute Care (LTAC) ?Living arrangements for the past 2 months: Apartment ?                ?  ?  ?  ?  ?  ?  ?  ?  ?  ?  ? ? ?Social Determinants of Health (SDOH) Interventions ?  ? ?Readmission Risk Interventions ?   ? View : No data to display.  ?  ?  ?  ? ? ?

## 2022-01-27 NOTE — TOC Progression Note (Addendum)
Transition of Care (TOC) - Initial/Assessment Note  ? ? ?Patient Details  ?Name: [name] ?MRN: [id number] ?Date of Birth: 21-Oct-1965 ? ?Transition of Care (TOC) CM/SW Contact:    ?Paulene Floor Chanoch Mccleery, LCSWA ?Phone Number: ?01/27/2022, 2:10 PM ? ?Clinical Narrative:                 ?CSW spoke with Claiborne Billings in admissions at [facility].  The facility is unable to accept the patient. ? ?15:45-  CSW contacted [facility] to inquire about their ability to accept the patient and is awaiting a response. ?  ?Barriers to Discharge: No Home Care Agency will accept this patient ? ? ?Patient Goals and CMS Choice ?Patient states their goals for this hospitalization and ongoing recovery are:: To get better and return home ?CMS Medicare.gov Compare Post Acute Care list provided to:: Patient ?Choice offered to / list presented to : Patient ? ?Expected Discharge Plan and Services ?  ?  ?Discharge Planning Services: CM Consult ?Post Acute Care Choice: Home Health, Long Term Acute Care (LTAC) ?Living arrangements for the past 2 months: Apartment ?                ?  ?  ?  ?  ?  ?  ?  ?  ?  ?  ? ?Prior Living Arrangements/Services ?Living arrangements for the past 2 months: Apartment ?Lives with:: Siblings (Brother) ?Patient language and need for interpreter reviewed:: Yes ?Do you feel safe going back to the place where you live?: Yes      ?Need for Family Participation in Patient Care: Yes (Comment) ?Care giver support system in place?: Yes (comment) ?Current home services: Biomedical scientist (Personal) ?Criminal Activity/Legal Involvement Pertinent to Current Situation/Hospitalization: No - Comment as needed ? ?Activities of Daily Living ?  ?  ? ?Permission Sought/Granted ?Permission sought to share information with : Case Manager, Customer service manager, Family Supports ?Permission granted to share information with : Yes, Verbal Permission Granted ?   ?   ?   ?   ? ?Emotional Assessment ?Appearance:: Appears stated  age ?Attitude/Demeanor/Rapport: Engaged ?Affect (typically observed): Frustrated, Hopeful ?Orientation: : Oriented to Self, Oriented to Place, Oriented to  Time, Oriented to Situation ?Alcohol / Substance Use: Not Applicable ?Psych Involvement: No (comment) ? ?Admission diagnosis:  Wound infection [T14.8XXA, L08.9] ?Complicated UTI (urinary tract infection) [N39.0] ?Sepsis, due to unspecified organism, unspecified whether acute organ dysfunction present ([place]) [A41.9] ?Patient Active Problem List  ? Diagnosis Date Noted  ? Complicated UTI (urinary tract infection) 01/15/2022  ? Gout 12/30/2021  ? Morbid obesity with BMI of 40.0-44.9, adult ([place]) 12/22/2021  ? Intertriginous skin ulcer ([place]) 12/22/2021  ? Acute kidney injury ([place]) 12/20/2021  ? Small bowel obstruction ([place]) 10/13/2021  ? Colon cancer screening 05/20/2021  ? Bloating 04/11/2021  ? Chronic indwelling Foley catheter 04/01/2021  ? Orthopnea 04/01/2021  ? Obstructive sleep apnea 02/24/2021  ? Anemia of chronic disease 02/24/2021  ? GERD (gastroesophageal reflux disease) 02/24/2021  ? Polysubstance abuse ([place]) 07/09/2019  ? Substance induced mood disorder ([place]) 08/21/2018  ? Therapeutic opioid-induced constipation (OIC) 02/14/2017  ? Decubitus ulcer of sacral region, stage 4 ([place])   ? UTI (urinary tract infection)   ? Colostomy in place for fecal diversion 06/01/2016  ? Protein-calorie malnutrition, severe ([place]) 05/31/2016  ? Chronic pain 05/26/2016  ? Hypertension 05/26/2016  ? Paraplegia ([place])   ? ?PCP:  No primary care provider on file. ?Pharmacy:   ?[facility] [place] -  [place] [address] ?Phone: [phone] Fax: [phone] ? ?[place], [place] [facility] [address] [address] ?[place] [address] ?Phone: [phone] Fax: [phone] ? ? ? ? ?Social Determinants of Health (SDOH) Interventions ?  ? ?Readmission Risk Interventions ?   ? View : No data to display.   ?  ?  ?  ? ? ? ?
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
## 2022-01-28 NOTE — Plan of Care (Signed)
  Problem: Clinical Measurements: Goal: Respiratory complications will improve Outcome: Progressing   Problem: Clinical Measurements: Goal: Cardiovascular complication will be avoided Outcome: Progressing   Problem: Nutrition: Goal: Adequate nutrition will be maintained Outcome: Progressing   
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
## 2022-01-29 ENCOUNTER — Inpatient Hospital Stay (HOSPITAL_COMMUNITY): Payer: Medicare Other

## 2022-01-29 DIAGNOSIS — N390 Urinary tract infection, site not specified: Secondary | ICD-10-CM | POA: Diagnosis not present

## 2022-01-29 MED ORDER — ALLOPURINOL 100 MG PO TABS
100.0000 mg | ORAL_TABLET | Freq: Every day | ORAL | Status: DC
  Administered 2022-01-29 – 2022-02-12 (×15): 100 mg via ORAL
  Filled 2022-01-29 (×15): qty 1
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
## 2022-01-29 NOTE — Progress Notes (Signed)
Patient has cpap at beside. Patient has home mask and able to place on when he is ready. ?

## 2022-01-29 NOTE — Progress Notes (Signed)
Patient placed himself on CPAP for the night with oxygen set at 2lpm ? ? ?

## 2022-01-30 ENCOUNTER — Other Ambulatory Visit: Payer: Self-pay

## 2022-01-30 DIAGNOSIS — L089 Local infection of the skin and subcutaneous tissue, unspecified: Secondary | ICD-10-CM

## 2022-01-30 DIAGNOSIS — A419 Sepsis, unspecified organism: Secondary | ICD-10-CM | POA: Diagnosis not present

## 2022-01-30 DIAGNOSIS — N390 Urinary tract infection, site not specified: Secondary | ICD-10-CM | POA: Diagnosis not present

## 2022-01-30 DIAGNOSIS — T148XXA Other injury of unspecified body region, initial encounter: Secondary | ICD-10-CM | POA: Diagnosis not present

## 2022-01-30 LAB — CBC
HCT: 26.3 % — ABNORMAL LOW (ref 39.0–52.0)
Hemoglobin: 7.9 g/dL — ABNORMAL LOW (ref 13.0–17.0)
MCH: 27.2 pg (ref 26.0–34.0)
MCHC: 30 g/dL (ref 30.0–36.0)
MCV: 90.7 fL (ref 80.0–100.0)
Platelets: 292 10*3/uL (ref 150–400)
RBC: 2.9 MIL/uL — ABNORMAL LOW (ref 4.22–5.81)
RDW: 16.7 % — ABNORMAL HIGH (ref 11.5–15.5)
WBC: 8.4 10*3/uL (ref 4.0–10.5)
nRBC: 0 % (ref 0.0–0.2)

## 2022-01-30 LAB — BASIC METABOLIC PANEL
Anion gap: 9 (ref 5–15)
BUN: 28 mg/dL — ABNORMAL HIGH (ref 6–20)
CO2: 26 mmol/L (ref 22–32)
Calcium: 8.4 mg/dL — ABNORMAL LOW (ref 8.9–10.3)
Chloride: 106 mmol/L (ref 98–111)
Creatinine, Ser: 1.11 mg/dL (ref 0.61–1.24)
GFR, Estimated: >60 mL/min (ref >60)
Glucose, Bld: 109 mg/dL — ABNORMAL HIGH (ref 70–99)
Potassium: 4.1 mmol/L (ref 3.5–5.1)
Sodium: 141 mmol/L (ref 135–145)

## 2022-01-30 NOTE — Plan of Care (Signed)
?  Problem: Nutrition: ?Goal: Adequate nutrition will be maintained ?Outcome: Progressing ?  ?Problem: Coping: ?Goal: Level of anxiety will decrease ?Outcome: Progressing ?  ?Problem: Elimination: ?Goal: Will not experience complications related to bowel motility ?Outcome: Progressing ?  ?Problem: Elimination: ?Goal: Will not experience complications related to urinary retention ?Outcome: Progressing ?  ?Problem: Pain Managment: ?Goal: General experience of comfort will improve ?Outcome: Progressing ?  ?

## 2022-01-30 NOTE — Progress Notes (Signed)
? ?Subjective:  ?Overnight Events: No acute events or concerns overnight. ? ?He states that his left wrist pain has significantly improved since last week.  He is not able to fully flex and extend the wrist yet but otherwise has no other complaints or concerns today. ? ?Objective: ? ?Vital signs in last 24 hours: ?Vitals:  ? 01/29/22 1845 01/29/22 2048 01/29/22 2112 01/30/22 0500  ?BP: (!) 93/58  97/67 106/60  ?Pulse: 79 85 80 70  ?Resp: '18 19 16 13  '$ ?Temp: 98.6 ?F (37 ?C)  98.9 ?F (37.2 ?C) 99.3 ?F (37.4 ?C)  ?TempSrc: Oral  Oral Axillary  ?SpO2: 98%  98% 99%  ?Weight:      ?Height:      ? ? ?Intake/Output Summary (Last 24 hours) at 01/30/2022 0636 ?Last data filed at 01/30/2022 0600 ?Gross per 24 hour  ?Intake 894 ml  ?Output 2975 ml  ?Net -2081 ml  ? ? ?Constitutional: Obese and in no acute distress.  ?Head: Normocephalic and atraumatic.  ?Eyes: R prosthetic eye, L eye anicteric ?Neck: Normal range of motion.  ?Cardiovascular: Distant heart sounds due to body habitus.  ?Pulmonary: Only able to auscultate anteriorly. Non labored breathing on room air, no wheezing or rales  ?Abdominal: Soft. Normal bowel sounds. Non distended and non tender ?Musculoskeletal: Mildly decreased flexion and extension of the left wrist. L wrist without erythema, no warmness to touch   ?   General: No tenderness or edema.  ?Neurological: Alert and oriented to person, place, and time.  No focal deficits appreciated. ?Skin: Skin is warm and dry.  ? ? ?Assessment/Plan: ? ?Principal Problem: ?  Complicated UTI (urinary tract infection) ? ?#Acute on chronic sacral wound infection ?#Hx chronic osteomyelitis ?#L1 SCI with paraplegia w/ colostomy and indwelling catheter ?Patient completed course of levaquin. He is currently awaiting SNF placement. Patient remains medically stable for discharge. ?Plan: ?-Completed Levaquin/Course of antibiotics ?-Blood cultures: NGTD ?-Daily wound care ?-TOC consult, appreciate assistance with placement, awaiting  hear back from [facility] on Monday ?-TOC airflow bed delivered. Ensure Enlive twice daily and MVI ?-PT/OT: OT recommending no OT follow-up. PT recommending SNF ? ?#Acute L wrist pain, improving  ?Allopurinol was recently held in the setting of AKI, and the patient subsequently developed significant left wrist pain.  He is status post a course of colchicine.  Allopurinol was restarted yesterday.   Left wrist films from yesterday show mild OA. Today, patient denies any significant pain.  Pain likely 2/2 gout flare given response to therapies given so far.  ?-If pain persists, will consider short course of NSAIDs ?-Continue allopurinol ?-Will hold steroids given infection.  ? ?#Acute right hip pain ?On 4/18, patient reported acute right hip pain that began during wound care as patient was logrolled.  XR of the hip showed moderate OA. Low suspicion for acute process. Managing supportively. Avoid movements that exacerbate pain.  ? ?#Chronic constipation, neurogenic bowel ?Has good ostomy output.  ?On miralax BID, senna BID, and milk of magnesia. Will need to monitor renal function.  ? ?#CAUTI, resolved ?Patient has chronic indwelling Foley which was replaced at admission.  Source control achieved.  Likely has chronic colonization.   ? ?#Hypotension, resolved ?#History of HTN ?Hypotension likely 2/2 hypovolemia  and infection. Hypotension improved after fluid resuscitation. SBPs currently in the low 100s. Holding home Olmesartan-amlodipine-HCTZ 40-10-25 mg daily. ? ?#Acute on chronic normocytic anemia ?#History of IDA ?Hemoglobin on admission 9.5 with baseline ~10. No sources of bleeding identified.  Suspect decrease in  hemoglobin on 4/20 to ~7.0 secondary to fluid resuscitation. Received 1uRBCs on 4/20 with improvement in Hgb. Stable.  ?Plan: ?-Trend CBC ?-Transfuse for hemoglobin less than 7 ?-Iron supplementation ? ?#Opioid use disorder ?Patient has history of opiate overdose in 2019 for which he had an ICU  admission.  At that time patient was deemed high risk for future opioid misuse.  Patient will need to continue on methadone for chronic opioid use disorder.  He attends [facility] and obtains weekly prescriptions from their providers. ?Plan: ?-Continue on home methadone '50mg'$  daily.   ? ?#OSA ?Continue on CPAP nightly. ? ?#GERD ?Continue on home famotidine '40mg'$  daily. ? ?#Right lower extremity spasticity ?Patient endorses right lower extremity pain with tightness of the muscles and contractures.  Suspect spasticity secondary to spinal cord injury.  Patient reports he has tried muscle relaxer such as baclofen in the past to minimal effect. Recommend PT.  ?Plan: ?-PT as above ? ?#Hypospadias ?#Posterior urethral valves ?Patient saw urology February 2022 Dr. Leory Plowman.  They had a discussion about urethroplasty. Discussed patient would need suprapubic catheter for 3 to 6 months after repair before he could proceed with CIC.  Prior to surgery patient would need preop evaluation and indwelling catheter changes monthly until surgery.  At this time patient was eager to proceed with surgery.  Unfortunately patient has been hospitalized twice since then.   ?-Patient needs urology outpatient visit scheduled before discharge for further discussion of surgical candidacy ? ?Diet: Regular  ?VTE: Xarelto at VTE ppx dose ?IVF: None ?Code: Full ? ?Prior to Admission Living Arrangement: Home ?Anticipated Discharge Location: TBD ?Barriers to Discharge: Obtaining airflow bed repair or replacement to discharge ?Dispo: Anticipated discharge in approximately 1-2 day(s).  ? ?[name] Duff, MD PGY-2 ?Internal Medicine  ?Pager [phone] ?

## 2022-01-30 NOTE — TOC Progression Note (Signed)
Transition of Care (TOC) - Initial/Assessment Note  ? ? ?Patient Details  ?Name: [name] ?MRN: [id number] ?Date of Birth: 1966/05/01 ? ?Transition of Care (TOC) CM/SW Contact:    ?Paulene Floor Deuce Paternoster, LCSWA ?Phone Number: ?01/30/2022, 12:05 PM ? ?Clinical Narrative:                 ?CSW contacted the three facilities that could potentially accept the patient ([facility], [facility], and [facility]).  None of the facilities are able to accept.  There are no SNF options available for the patient at this time. ? ?  ?Barriers to Discharge: No Home Care Agency will accept this patient ? ? ?Patient Goals and CMS Choice ?Patient states their goals for this hospitalization and ongoing recovery are:: To get better and return home ?CMS Medicare.gov Compare Post Acute Care list provided to:: Patient ?Choice offered to / list presented to : Patient ? ?Expected Discharge Plan and Services ?  ?  ?Discharge Planning Services: CM Consult ?Post Acute Care Choice: Home Health, Long Term Acute Care (LTAC) ?Living arrangements for the past 2 months: Apartment ?                ?  ?  ?  ?  ?  ?  ?  ?  ?  ?  ? ?Prior Living Arrangements/Services ?Living arrangements for the past 2 months: Apartment ?Lives with:: Siblings (Brother) ?Patient language and need for interpreter reviewed:: Yes ?Do you feel safe going back to the place where you live?: Yes      ?Need for Family Participation in Patient Care: Yes (Comment) ?Care giver support system in place?: Yes (comment) ?Current home services: Biomedical scientist (Personal) ?Criminal Activity/Legal Involvement Pertinent to Current Situation/Hospitalization: No - Comment as needed ? ?Activities of Daily Living ?  ?  ? ?Permission Sought/Granted ?Permission sought to share information with : Case Manager, Customer service manager, Family Supports ?Permission granted to share information with : Yes, Verbal Permission Granted ?   ?   ?   ?   ? ?Emotional Assessment ?Appearance:: Appears  stated age ?Attitude/Demeanor/Rapport: Engaged ?Affect (typically observed): Frustrated, Hopeful ?Orientation: : Oriented to Self, Oriented to Place, Oriented to  Time, Oriented to Situation ?Alcohol / Substance Use: Not Applicable ?Psych Involvement: No (comment) ? ?Admission diagnosis:  Wound infection [T14.8XXA, L08.9] ?Complicated UTI (urinary tract infection) [N39.0] ?Sepsis, due to unspecified organism, unspecified whether acute organ dysfunction present ([place]) [A41.9] ?Patient Active Problem List  ? Diagnosis Date Noted  ? Complicated UTI (urinary tract infection) 01/15/2022  ? Gout 12/30/2021  ? Morbid obesity with BMI of 40.0-44.9, adult ([place]) 12/22/2021  ? Intertriginous skin ulcer ([place]) 12/22/2021  ? Acute kidney injury ([place]) 12/20/2021  ? Small bowel obstruction ([place]) 10/13/2021  ? Colon cancer screening 05/20/2021  ? Bloating 04/11/2021  ? Chronic indwelling Foley catheter 04/01/2021  ? Orthopnea 04/01/2021  ? Obstructive sleep apnea 02/24/2021  ? Anemia of chronic disease 02/24/2021  ? GERD (gastroesophageal reflux disease) 02/24/2021  ? Polysubstance abuse ([place]) 07/09/2019  ? Substance induced mood disorder ([place]) 08/21/2018  ? Therapeutic opioid-induced constipation ([facility]) 02/14/2017  ? Decubitus ulcer of sacral region, stage 4 ([place])   ? UTI (urinary tract infection)   ? Colostomy in place for fecal diversion 06/01/2016  ? Protein-calorie malnutrition, severe ([place]) 05/31/2016  ? Chronic pain 05/26/2016  ? Hypertension 05/26/2016  ? Paraplegia ([place])   ? ?PCP:  No primary care provider on file. ?Pharmacy:   ?[place],  [place] - [address] [place] ?[place] [address] ?Phone: [phone] Fax: [phone] ? ?[place], [place] - [address] ?[address] ?[place] [address] ?Phone: [phone] Fax: [phone] ? ? ? ? ?Social Determinants of Health (SDOH) Interventions ?  ? ?Readmission Risk Interventions ?   ? View : No data to  display.  ?  ?  ?  ? ? ? ?

## 2022-01-31 DIAGNOSIS — N390 Urinary tract infection, site not specified: Secondary | ICD-10-CM | POA: Diagnosis not present

## 2022-01-31 DIAGNOSIS — T148XXA Other injury of unspecified body region, initial encounter: Secondary | ICD-10-CM | POA: Diagnosis not present

## 2022-01-31 DIAGNOSIS — L089 Local infection of the skin and subcutaneous tissue, unspecified: Secondary | ICD-10-CM | POA: Diagnosis not present

## 2022-01-31 DIAGNOSIS — A419 Sepsis, unspecified organism: Secondary | ICD-10-CM | POA: Diagnosis not present

## 2022-01-31 LAB — BASIC METABOLIC PANEL
Anion gap: 8 (ref 5–15)
BUN: 34 mg/dL — ABNORMAL HIGH (ref 6–20)
CO2: 26 mmol/L (ref 22–32)
Calcium: 8.3 mg/dL — ABNORMAL LOW (ref 8.9–10.3)
Chloride: 103 mmol/L (ref 98–111)
Creatinine, Ser: 0.98 mg/dL (ref 0.61–1.24)
GFR, Estimated: >60 mL/min (ref >60)
Glucose, Bld: 111 mg/dL — ABNORMAL HIGH (ref 70–99)
Potassium: 3.9 mmol/L (ref 3.5–5.1)
Sodium: 137 mmol/L (ref 135–145)

## 2022-01-31 LAB — CBC
HCT: 25.8 % — ABNORMAL LOW (ref 39.0–52.0)
Hemoglobin: 7.6 g/dL — ABNORMAL LOW (ref 13.0–17.0)
MCH: 27 pg (ref 26.0–34.0)
MCHC: 29.5 g/dL — ABNORMAL LOW (ref 30.0–36.0)
MCV: 91.5 fL (ref 80.0–100.0)
Platelets: 283 10*3/uL (ref 150–400)
RBC: 2.82 MIL/uL — ABNORMAL LOW (ref 4.22–5.81)
RDW: 16.7 % — ABNORMAL HIGH (ref 11.5–15.5)
WBC: 8.1 10*3/uL (ref 4.0–10.5)
nRBC: 0 % (ref 0.0–0.2)

## 2022-01-31 NOTE — Progress Notes (Signed)
? ?Subjective:  ?Overnight Events: No acute events or concerns overnight. ? ?The patient states that his left wrist pain continues to improve.  Denies any specific complaints or concerns today. ? ?Objective: ? ?Vital signs in last 24 hours: ?Vitals:  ? 01/30/22 0500 01/30/22 0845 01/30/22 2129 01/31/22 0500  ?BP: 106/60 (!) 105/59 97/71 (!) 93/55  ?Pulse: 70 67 85 74  ?Resp: '13 15 20 14  '$ ?Temp: 99.3 ?F (37.4 ?C) 97.7 ?F (36.5 ?C) 98.6 ?F (37 ?C) 99.1 ?F (37.3 ?C)  ?TempSrc: Axillary Oral Oral Axillary  ?SpO2: 99% 100% 99% 95%  ?Weight:      ?Height:      ? ? ?Intake/Output Summary (Last 24 hours) at 01/31/2022 0706 ?Last data filed at 01/31/2022 9767 ?Gross per 24 hour  ?Intake 2020 ml  ?Output 2300 ml  ?Net -280 ml  ? ? ?Constitutional: Obese and in no acute distress.  ?Head: Normocephalic and atraumatic.  ?Eyes: R prosthetic eye, L eye anicteric ?Neck: Normal range of motion.  ?Cardiovascular: Distant heart sounds due to body habitus.  ?Pulmonary: Only able to auscultate anteriorly. Non labored breathing on room air, no wheezing or rales  ?Abdominal: Soft. Normal bowel sounds. Non distended and non tender ?Musculoskeletal: Mildly decreased flexion and extension of the left wrist. L wrist without erythema, no warmness to touch   ?   General: No tenderness or edema.  ?Neurological: Alert and oriented to person, place, and time.  No focal deficits appreciated. ?Skin: Skin is warm and dry.  ? ?Assessment/Plan: ? ?Principal Problem: ?  Complicated UTI (urinary tract infection) ? ?#Acute on chronic sacral wound infection ?#Hx chronic osteomyelitis ?#L1 SCI with paraplegia w/ colostomy and indwelling catheter ?Patient completed course of levaquin.  Unfortunately, it appears that there are no SNF or home health options at this point. Pt has an aide that comes to the house several times a week, however he states that they do not assist him with his wound care and has been taking care of the wounds himself. The company he uses  is [employer] home aide (Mr. Alfonzo Beers). Will need to reach out to [employer] to ensure that his aide is able to provide wound care to the patient prior to discharge. Otherwise remains medically stable for discharge. ?Plan: ?-Completed Levaquin/Course of antibiotics ?-Blood cultures: NGTD ?-Daily wound care ?-TOC consult, appreciate assistance with placement ?-TOC airflow bed delivered. Ensure Enlive twice daily and MVI ? ?#Acute L wrist pain, improving  ?Allopurinol was recently held in the setting of AKI, and the patient subsequently developed significant left wrist pain. He is status post a course of colchicine.  Allopurinol was restarted on 04/30.  Left wrist films from yesterday show mild OA. Today, patient denies any significant pain. Pain likely 2/2 gout flare given response to therapies given so far.  ?-If pain persists, will consider short course of NSAIDs ?-Continue allopurinol ?-Will hold steroids given infection.  ? ?#Acute right hip pain ?On 4/18, patient reported acute right hip pain that began during wound care as patient was logrolled.  XR of the hip showed moderate OA. Low suspicion for acute process. Managing supportively. Avoid movements that exacerbate pain.  ? ?#Chronic constipation, neurogenic bowel ?Has good ostomy output.  ?On miralax BID, senna BID, and milk of magnesia. Will need to monitor renal function.  ? ?#CAUTI, resolved ?Patient has chronic indwelling Foley which was replaced at admission.  Source control achieved.  Likely has chronic colonization.   ? ?#Hypotension, resolved ?#History of HTN ?Hypotension  likely 2/2 hypovolemia  and infection. Hypotension improved after fluid resuscitation. SBPs currently in the low 90s-100s. Holding home Olmesartan-amlodipine-HCTZ 40-10-25 mg daily. ? ?#Acute on chronic normocytic anemia ?#History of IDA ?Hemoglobin on admission 9.5 with baseline ~10. No sources of bleeding identified.  Suspect decrease in hemoglobin on 4/20 to ~7.0 secondary to fluid  resuscitation. Received 1uRBCs on 4/20 with improvement in Hgb. Stable.  ?Plan: ?-Trend CBC ?-Transfuse for hemoglobin less than 7 ?-Iron supplementation ? ?#Opioid use disorder ?Patient has history of opiate overdose in 2019 for which he had an ICU admission.  At that time patient was deemed high risk for future opioid misuse.  Patient will need to continue on methadone for chronic opioid use disorder.  He attends [facility] and obtains weekly prescriptions from their providers. ?Plan: ?-Continue on home methadone '50mg'$  daily.   ? ?#OSA ?Continue on CPAP nightly. ? ?#GERD ?Continue on home famotidine '40mg'$  daily. ? ?#Right lower extremity spasticity ?Patient endorses right lower extremity pain with tightness of the muscles and contractures.  Suspect spasticity secondary to spinal cord injury.  Patient reports he has tried muscle relaxer such as baclofen in the past to minimal effect. Recommend PT.  ?Plan: ?-PT as above ? ?#Hypospadias ?#Posterior urethral valves ?Patient saw urology February 2022 Dr. Leory Plowman.  They had a discussion about urethroplasty. Discussed patient would need suprapubic catheter for 3 to 6 months after repair before he could proceed with CIC.  Prior to surgery patient would need preop evaluation and indwelling catheter changes monthly until surgery.  At this time patient was eager to proceed with surgery.  Unfortunately patient has been hospitalized twice since then.   ?-Patient needs urology outpatient visit scheduled before discharge for further discussion of surgical candidacy ? ?Diet: Regular  ?VTE: Xarelto at VTE ppx dose ?IVF: None ?Code: Full ? ?Prior to Admission Living Arrangement: Home ?Anticipated Discharge Location: TBD ?Barriers to Discharge: Placement, patient will need wound care at home. ?Dispo: Anticipated discharge in approximately 1-2 day(s).  ? ?Mitzie Na, MD PGY-1 ?Internal Medicine  ?[phone]  ?

## 2022-01-31 NOTE — Plan of Care (Signed)
  Problem: Activity: Goal: Risk for activity intolerance will decrease Outcome: Progressing   Problem: Coping: Goal: Level of anxiety will decrease Outcome: Progressing   

## 2022-01-31 NOTE — Consult Note (Signed)
WOC Nurse Consult Note: ?Reason for Consult: Instructions for post discharge wound care for patient's caregiver. ? ?I have communicated with Dr. Lucila Maine via Secure Chat that the instructions for both wound and ostomy care are in the Orders, under Nursing.  Those can be printed or otherwise transcribed for the care provider for this patient post discharge. I will additionally place them below for the convenience of the team. ? ?Wound Care: ? ?Wound care to buttocks, posterior thigh, perineal, scrotal area chronic areas of pressure injury (Stage 4) post skin flap:  Cleanse with soap and water, rinse with NS, pat dry. Cover affected areas with silver hydrofiber (Aquacel Ag+, Lawson # [id number]), tuck into crevices using cotton tipped applicator where able. Top dressings with ABD pads and secure with paper tape. Turn patient from side to side. No disposable briefs or under pads. ? ?Ostomy Care ? ?Empty pouch when 1/3  to ? full of stool/flatus ?Clean bottom 2-inches of fecal pouch prior to resealing ?Assist patient in emptying pouch if it cannot be performed independently ?Change pouch twice weekly and PRN.  Write date of pouch application on pouch. ?Apply skin barrier ring to clean skin, place skin barrier on top of skin barrier ring and attach pouch to skin barrier. ?Have spare pouch at bedside at all times ?Order Supplies: 2-piece 2 and 3/4 inch ostomy pouching system with skin barrier ring.   ? ? ?[place] nursing team will not follow, but will remain available to this patient, the nursing and medical teams.  Please re-consult if needed. ?Thanks, ?Maudie Flakes, MSN, RN, [place], [place], CWON-AP, [place]  ?Pager# [phone]  ? ?  ?
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
## 2022-01-31 NOTE — Progress Notes (Signed)
Pt placed himself on hospital cpap machine with pt home mask.  ?

## 2022-01-31 NOTE — Progress Notes (Signed)
Pt states he will self administer the CPAP when he is ready for rest. ?
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
## 2022-02-01 DIAGNOSIS — T148XXA Other injury of unspecified body region, initial encounter: Secondary | ICD-10-CM | POA: Diagnosis not present

## 2022-02-01 DIAGNOSIS — A419 Sepsis, unspecified organism: Secondary | ICD-10-CM | POA: Diagnosis not present

## 2022-02-01 DIAGNOSIS — L089 Local infection of the skin and subcutaneous tissue, unspecified: Secondary | ICD-10-CM | POA: Diagnosis not present

## 2022-02-01 DIAGNOSIS — N390 Urinary tract infection, site not specified: Secondary | ICD-10-CM | POA: Diagnosis not present

## 2022-02-01 LAB — BASIC METABOLIC PANEL
Anion gap: 6 (ref 5–15)
BUN: 34 mg/dL — ABNORMAL HIGH (ref 6–20)
CO2: 29 mmol/L (ref 22–32)
Calcium: 8.6 mg/dL — ABNORMAL LOW (ref 8.9–10.3)
Chloride: 104 mmol/L (ref 98–111)
Creatinine, Ser: 1 mg/dL (ref 0.61–1.24)
GFR, Estimated: >60 mL/min (ref >60)
Glucose, Bld: 100 mg/dL — ABNORMAL HIGH (ref 70–99)
Potassium: 4.2 mmol/L (ref 3.5–5.1)
Sodium: 139 mmol/L (ref 135–145)

## 2022-02-01 LAB — CBC
HCT: 25 % — ABNORMAL LOW (ref 39.0–52.0)
Hemoglobin: 7.4 g/dL — ABNORMAL LOW (ref 13.0–17.0)
MCH: 27 pg (ref 26.0–34.0)
MCHC: 29.6 g/dL — ABNORMAL LOW (ref 30.0–36.0)
MCV: 91.2 fL (ref 80.0–100.0)
Platelets: 301 10*3/uL (ref 150–400)
RBC: 2.74 MIL/uL — ABNORMAL LOW (ref 4.22–5.81)
RDW: 17 % — ABNORMAL HIGH (ref 11.5–15.5)
WBC: 7.8 10*3/uL (ref 4.0–10.5)
nRBC: 0.4 % — ABNORMAL HIGH (ref 0.0–0.2)
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
## 2022-02-01 NOTE — Progress Notes (Signed)
? ?Subjective:  ?Overnight Events: No acute events or concerns overnight. ? ?Patient was seen at bedside during rounds this AM.  He denies any specific complaints or concerns today. ? ?Objective: ? ?Vital signs in last 24 hours: ?Vitals:  ? 01/30/22 2129 01/31/22 0500 01/31/22 2044 02/01/22 0514  ?BP: 97/71 (!) 93/55 100/67 (!) 85/55  ?Pulse: 85 74 88 76  ?Resp: '20 14 18 18  '$ ?Temp: 98.6 ?F (37 ?C) 99.1 ?F (37.3 ?C) 98.7 ?F (37.1 ?C) 98.7 ?F (37.1 ?C)  ?TempSrc: Oral Axillary Oral Oral  ?SpO2: 99% 95% 97% 99%  ?Weight:      ?Height:      ? ? ?Intake/Output Summary (Last 24 hours) at 02/01/2022 0857 ?Last data filed at 02/01/2022 0514 ?Gross per 24 hour  ?Intake 1300 ml  ?Output 2300 ml  ?Net -1000 ml  ? ? ?Constitutional: Obese and in no acute distress.  ?Head: Normocephalic and atraumatic.  ?Eyes: R prosthetic eye, L eye anicteric ?Neck: Normal range of motion.  ?Cardiovascular: Distant heart sounds due to body habitus.  ?Pulmonary: Only able to auscultate anteriorly. Non labored breathing on room air, no wheezing or rales  ?Abdominal: Soft. Normal bowel sounds. Non distended and non tender ?Musculoskeletal: Mildly decreased flexion and extension of the left wrist. L wrist without erythema, and no warmness to touch   ?   General: No tenderness or edema.  ?Neurological: Alert and oriented to person, place, and time. No focal deficits appreciated. ?Skin: Skin is warm and dry.  ? ?Assessment/Plan: ? ?Principal Problem: ?  Complicated UTI (urinary tract infection) ? ?#Acute on chronic sacral wound infection ?#Hx chronic osteomyelitis ?#L1 SCI with paraplegia w/ colostomy and indwelling catheter ?Patient completed course of levaquin.  Unfortunately, it appears that there are no SNF or home health options at this point. Pt has an aide that comes to the house several times a week, however he states that they do not assist him with his wound care and has been taking care of the wounds himself.  Contacted [employer], who is a  company that patient's aide works well, however they stated that they are not comfortable managing his wounds at home.  At this time, plan to see if [facility] can perform more frequent wound dressing changes for the patient. We will also see if patient can get transportation through Medicare as he states that he has had issues with this in the past.  ?Plan: ?-Completed Levaquin/Course of antibiotics ?-Blood cultures: NGTD ?-Daily wound care ?-TOC consult, appreciate assistance with placement ?-TOC airflow bed delivered. Ensure Enlive twice daily and MVI ? ?#Acute L wrist pain, improving  ?Allopurinol was recently held in the setting of AKI, and the patient subsequently developed significant left wrist pain. He is status post a course of colchicine.  Allopurinol was restarted on 04/30.  Recent left wrist films show mild OA. Today, patient denies any significant pain. Pain likely 2/2 gout flare given response to therapies given so far.  ?-If pain persists, can consider short course of NSAIDs ?-Continue allopurinol ?-Will hold steroids given infection.  ? ?#Acute right hip pain ?On 4/18, patient reported acute right hip pain that began during wound care as patient was logrolled.  XR of the hip showed moderate OA. Low suspicion for acute process. Managing supportively. Avoid movements that exacerbate pain.  ? ?#Chronic constipation, neurogenic bowel ?Has good ostomy output.  ?On miralax BID, senna BID, and milk of magnesia. Will need to monitor renal function.  ? ?#CAUTI, resolved ?  Patient has chronic indwelling Foley which was replaced at admission.  Source control achieved.  Likely has chronic colonization.   ? ?#Hypotension, resolved ?#History of HTN ?Hypotension likely 2/2 hypovolemia  and infection. Hypotension improved after fluid resuscitation. SBPs currently in the low 90s-100s. Holding home Olmesartan-amlodipine-HCTZ 40-10-25 mg daily. ? ?#Acute on chronic normocytic anemia ?#History of IDA ?Hemoglobin on  admission 9.5 with baseline ~10. No sources of bleeding identified.  Suspect decrease in hemoglobin on 4/20 to ~7.0 secondary to fluid resuscitation. Received 1uRBCs on 4/20 with improvement in Hgb. Stable.  ?Plan: ?-Trend CBC ?-Transfuse for hemoglobin less than 7 ?-Iron supplementation ? ?#Opioid use disorder ?Patient has history of opiate overdose in 2019 for which he had an ICU admission.  At that time patient was deemed high risk for future opioid misuse.  Patient will need to continue on methadone for chronic opioid use disorder.  He attends [facility] and obtains weekly prescriptions from their providers. ?Plan: ?-Continue on home methadone '50mg'$  daily.   ? ?#OSA ?Continue on CPAP nightly. ? ?#GERD ?Continue on home famotidine '40mg'$  daily. ? ?#Right lower extremity spasticity ?Patient endorses right lower extremity pain with tightness of the muscles and contractures.  Suspect spasticity secondary to spinal cord injury.  Patient reports he has tried muscle relaxer such as baclofen in the past to minimal effect. Recommend PT.  ?Plan: ?-PT as above ? ?#Hypospadias ?#Posterior urethral valves ?Patient saw urology February 2022 Dr. Leory Plowman. They had a discussion about urethroplasty. Discussed patient would need suprapubic catheter for 3 to 6 months after repair before he could proceed with CIC.  Prior to surgery patient would need preop evaluation and indwelling catheter changes monthly until surgery.  At this time patient was eager to proceed with surgery. Unfortunately patient has been hospitalized twice since then.   ?-Patient needs urology outpatient visit scheduled before discharge for further discussion of surgical candidacy ? ?Diet: Regular  ?VTE: Xarelto at VTE ppx dose ?IVF: None ?Code: Full ? ?Prior to Admission Living Arrangement: Home ?Anticipated Discharge Location: TBD ?Barriers to Discharge: Placement, patient will need wound care at home. ?Dispo: Anticipated discharge in  approximately 1-2 day(s).  ? ?Mitzie Na, MD PGY-1 ?Internal Medicine  ?[phone]  ?

## 2022-02-01 NOTE — TOC Progression Note (Addendum)
Transition of Care (TOC) - Progression Note  ? ? ?Patient Details  ?Name: Braidyn Kutsch ?MRN: [id number] ?Date of Birth: [birth year] ? ?Transition of Care (TOC) CM/SW Contact  ?Tom-Johnson, Renea Ee, RN ?Phone Number: ?02/01/2022, 4:03 PM ? ?Clinical Narrative:    ? ?CM spoke with MD about patient's disposition. Patient is medically ready for discharge. SNF's and Home health agencies are unable to accept patient due to him being on Methadone, type of insurance, attitude to staff, non compliance and his wound being chronic.  ?CM called and spoke with Janett Billow at [facility] [phone]) about resuming patient's wound care at discharge. Janett Billow states that the personal aide will not be able to care for patient's wound due to it being a stage IV wound. CM notified MD and was able to secure outpatient appointment at [place] [facility] [phone]) on a weekly schedule. Patient should start on 02/14/22 at 08:30 am.  ?CM called Medicaid transportation and spoke with Yvetta Coder [phone]) and scheduled patient's appointment. Yvetta Coder states Medicaid office will fax over a form for CM to fill out due to patient going to [place] and that is out of [place].  ?CM notified MD and awaiting forms via fax. CM will continue to follow with needs. ?  ?Barriers to Discharge: No Home Care Agency will accept this patient ? ?Expected Discharge Plan and Services ?  ?  ?Discharge Planning Services: CM Consult ?Post Acute Care Choice: Home Health, Long Term Acute Care (LTAC) ?Living arrangements for the past 2 months: Apartment ?                ?  ?  ?  ?  ?  ?  ?  ?  ?  ?  ? ? ?Social Determinants of Health (SDOH) Interventions ?  ? ?Readmission Risk Interventions ?   ? View : No data to display.  ?  ?  ?  ? ? ?

## 2022-02-02 DIAGNOSIS — A419 Sepsis, unspecified organism: Secondary | ICD-10-CM | POA: Diagnosis not present

## 2022-02-02 DIAGNOSIS — N390 Urinary tract infection, site not specified: Secondary | ICD-10-CM | POA: Diagnosis not present

## 2022-02-02 DIAGNOSIS — L089 Local infection of the skin and subcutaneous tissue, unspecified: Secondary | ICD-10-CM | POA: Diagnosis not present

## 2022-02-02 DIAGNOSIS — T148XXA Other injury of unspecified body region, initial encounter: Secondary | ICD-10-CM | POA: Diagnosis not present

## 2022-02-02 NOTE — Progress Notes (Signed)
? ?Subjective:  ?Overnight Events: No acute events or concerns overnight. ? ?Patient was seen at bedside during rounds this AM.  He endorses pain in his legs overnight. He does not feel comfortable with going to [facility] for wound care as it is only once weekly. Discussed that best care for him would be to go to SNF for wound care. however, unable to do this because of his methadone. No other complaints or concerns.  ? ?Objective: ? ?Vital signs in last 24 hours: ?Vitals:  ? 02/01/22 0935 02/01/22 2116 02/02/22 0452 02/02/22 0757  ?BP: 101/60 (!) 96/56 (!) 89/49 (!) 93/59  ?Pulse: 81 80 75 70  ?Resp: '19 18 18   '$ ?Temp: 98.5 ?F (36.9 ?C) 98.7 ?F (37.1 ?C) 98.2 ?F (36.8 ?C) 98.1 ?F (36.7 ?C)  ?TempSrc:  Oral Oral   ?SpO2: 100% 95% 99% 98%  ?Weight:      ?Height:      ? ? ?Intake/Output Summary (Last 24 hours) at 02/02/2022 1005 ?Last data filed at 02/02/2022 0456 ?Gross per 24 hour  ?Intake 600 ml  ?Output 2650 ml  ?Net -2050 ml  ? ?Constitutional: Obese and in no acute distress.  ?Head: Normocephalic and atraumatic.  ?Eyes: R prosthetic eye, L eye anicteric ?Neck: Normal range of motion.  ?Cardiovascular: Distant heart sounds due to body habitus.  ?Pulmonary: Non labored breathing on room air. ?Abdominal: Soft. Normal bowel sounds. Non distended ?Neurological: Alert and oriented to person, place, and time. No focal deficits appreciated. ?Skin: Skin is warm and dry.  ? ?Assessment/Plan: ? ?Principal Problem: ?  Complicated UTI (urinary tract infection) ? ?#Acute on chronic sacral wound infection ?#Hx chronic osteomyelitis ?#L1 SCI with paraplegia w/ colostomy and indwelling catheter ?Patient completed course of levaquin.  Unfortunately, it appears that there are no SNF or home health options at this point.  On 5/3, we were able to arrange once weekly wound care at [facility].  However, the patient has stated multiple times that he does not wish to go here for wound care and would rather have more  extensive care.  We have explained to the patient that this is our only option at this point, however we will consult palliative care to see if there are any other options that they can offer.  If they are unable to offer any recommendations, will need to discharge patient as he is medically stable at this time. ?Plan: ?-Completed Levaquin/Course of antibiotics ?-Blood cultures: NGTD ?-Daily wound care ?-TOC consult, appreciate assistance with placement ?-TOC airflow bed delivered. Ensure Enlive twice daily and MVI ? ?#Acute L wrist pain, improving  ?Allopurinol was recently held in the setting of AKI, and the patient subsequently developed significant left wrist pain. He is status post a course of colchicine. Allopurinol was restarted on 04/30. Recent left wrist films show mild OA. Pain likely 2/2 gout flare given response to therapies given so far.  ?-If pain persists, can consider short course of NSAIDs ?-Continue allopurinol ?-Will hold steroids given infection.  ? ?#Acute right hip pain ?On 4/18, patient reported acute right hip pain that began during wound care as patient was logrolled.  XR of the hip showed moderate OA. Low suspicion for acute process. Managing supportively. Avoid movements that exacerbate pain.  ? ?#Chronic constipation, neurogenic bowel ?Has good ostomy output.  ?On miralax BID, senna BID, and milk of magnesia. Will need to monitor renal function.  ? ?#CAUTI, resolved ?Patient has chronic indwelling Foley which was replaced at  admission.  Source control achieved.  Likely has chronic colonization.   ? ?#Hypotension, resolved ?#History of HTN ?Hypotension likely 2/2 hypovolemia  and infection. Hypotension improved after fluid resuscitation. SBPs currently in the low 90s-100s. Holding home Olmesartan-amlodipine-HCTZ 40-10-25 mg daily. ? ?#Acute on chronic normocytic anemia ?#History of IDA ?Hemoglobin on admission 9.5 with baseline ~10. No sources of bleeding identified.  Suspect decrease in  hemoglobin on 4/20 to ~7.0 secondary to fluid resuscitation. Received 1uRBCs on 4/20 with improvement in Hgb. Stable.  ?Plan: ?-Trend CBC ?-Transfuse for hemoglobin less than 7 ?-Iron supplementation ? ?#Opioid use disorder ?Patient has history of opiate overdose in 2019 for which he had an ICU admission.  At that time patient was deemed high risk for future opioid misuse.  Patient will need to continue on methadone for chronic opioid use disorder.  He attends [facility] and obtains weekly prescriptions from their providers. ?Plan: ?-Continue on home methadone '50mg'$  daily.   ? ?#OSA ?Continue on CPAP nightly. ? ?#GERD ?Continue on home famotidine '40mg'$  daily. ? ?#Right lower extremity spasticity ?Patient endorses right lower extremity pain with tightness of the muscles and contractures.  Suspect spasticity secondary to spinal cord injury.  Patient reports he has tried muscle relaxer such as baclofen in the past to minimal effect. Recommend PT.  ?Plan: ?-PT as above ? ?#Hypospadias ?#Posterior urethral valves ?Patient saw urology February 2022 Dr. Leory Plowman. They had a discussion about urethroplasty. Discussed patient would need suprapubic catheter for 3 to 6 months after repair before he could proceed with CIC.  Prior to surgery patient would need preop evaluation and indwelling catheter changes monthly until surgery.  At this time patient was eager to proceed with surgery. Unfortunately patient has been hospitalized twice since then.   ?-Patient needs urology outpatient visit scheduled before discharge for further discussion of surgical candidacy ? ?Diet: Regular  ?VTE: Xarelto at VTE ppx dose ?IVF: None ?Code: Full ? ?Prior to Admission Living Arrangement: Home ?Anticipated Discharge Location: Home ?Barriers to Discharge: Palliative care evaluation ?Dispo: Anticipated discharge in approximately 0-1 day(s).  ? ?Mitzie Na, MD PGY-1 ?Internal Medicine  ?[phone] ?

## 2022-02-02 NOTE — TOC Progression Note (Signed)
Transition of Care (TOC) - Progression Note  ? ? ?Patient Details  ?Name: Mccall Marini ?MRN: [id number] ?Date of Birth: 1966-03-19 ? ?Transition of Care (TOC) CM/SW Contact  ?Tom-Johnson, Renea Ee, RN ?Phone Number: ?02/02/2022, 4:12 PM ? ?Clinical Narrative:    ? ?CM spoke with MD about patient's disposition. Medicaid transportation has been arranged for patient to start outpatient wound care at [facility]. MD notified CM that patient declined outpatient wound care and will consult with Palliative. CM has exhausted all disposition option at this time. CM will continue to follow with needs.  ?  ?Barriers to Discharge: No Home Care Agency will accept this patient ? ?Expected Discharge Plan and Services ?  ?  ?Discharge Planning Services: CM Consult ?Post Acute Care Choice: Home Health, Long Term Acute Care (LTAC) ?Living arrangements for the past 2 months: Apartment ?                ?  ?  ?  ?  ?  ?  ?  ?  ?  ?  ? ? ?Social Determinants of Health (SDOH) Interventions ?  ? ?Readmission Risk Interventions ?   ? View : No data to display.  ?  ?  ?  ? ? ?

## 2022-02-03 ENCOUNTER — Encounter (HOSPITAL_COMMUNITY): Payer: Self-pay | Admitting: Infectious Diseases

## 2022-02-03 DIAGNOSIS — N390 Urinary tract infection, site not specified: Secondary | ICD-10-CM | POA: Diagnosis not present

## 2022-02-03 DIAGNOSIS — T148XXA Other injury of unspecified body region, initial encounter: Secondary | ICD-10-CM | POA: Diagnosis not present

## 2022-02-03 DIAGNOSIS — Z515 Encounter for palliative care: Secondary | ICD-10-CM | POA: Diagnosis not present

## 2022-02-03 DIAGNOSIS — Z7189 Other specified counseling: Secondary | ICD-10-CM | POA: Diagnosis not present

## 2022-02-03 DIAGNOSIS — L089 Local infection of the skin and subcutaneous tissue, unspecified: Secondary | ICD-10-CM | POA: Diagnosis not present

## 2022-02-03 NOTE — Progress Notes (Addendum)
Patient discussed in LOS/ Quality collaboration meeting. Unable to send patient to SNF/ no accepting facility. Does not qualify for LTAC. Unable to arrange [facility]. Patient has [facility] to assist him in the home. He is refusing Outpatient Wound Care. TOC has exhausted all options at this time. DCP - return home with [place]. When medically stable please enter a discharge order. ?Olga Coaster RN,MHA, BSN ?Transition of Care Supervisor ?[phone] ?

## 2022-02-03 NOTE — Progress Notes (Addendum)
? ?Subjective:  ?Overnight Events: No acute events or concerns overnight. ? ?Patient was seen at bedside during rounds this AM.  He endorses pain in his legs overnight.  Patient continues to state that the best care for him would be more frequent wound dressing changes than the once weekly option that we are only able to offer after exhausting all of our other options (SNF, home health, etc.).  Patient's sister states that we are "not working hard enough to help him."  ? ?No other complaints or concerns.  ? ?Objective: ? ?Vital signs in last 24 hours: ?Vitals:  ? 02/02/22 0757 02/02/22 1646 02/02/22 2032 02/03/22 0411  ?BP: (!) 93/59 (!) 99/59 (!) 90/54 (!) 89/49  ?Pulse: 70 86 88 69  ?Resp:  '18 18 16  '$ ?Temp: 98.1 ?F (36.7 ?C) 99.1 ?F (37.3 ?C) 99.4 ?F (37.4 ?C) (!) 97.5 ?F (36.4 ?C)  ?TempSrc:  Oral Oral Oral  ?SpO2: 98% 100% 95%   ?Weight:      ?Height:      ? ? ?Intake/Output Summary (Last 24 hours) at 02/03/2022 0711 ?Last data filed at 02/02/2022 2032 ?Gross per 24 hour  ?Intake 711 ml  ?Output 1100 ml  ?Net -389 ml  ? ? ?Constitutional: Obese and in no acute distress.  ?Head: Normocephalic and atraumatic.  ?Eyes: R prosthetic eye, L eye anicteric ?Neck: Normal range of motion.  ?Cardiovascular: Distant heart sounds due to body habitus.  ?Pulmonary: Non labored breathing on room air. ?Abdominal: Soft. Normal bowel sounds. Non distended ?Neurological: Alert and oriented to person, place, and time. No focal deficits appreciated. ?Skin: Skin is warm and dry.  ? ?Assessment/Plan: ? ?Principal Problem: ?  Complicated UTI (urinary tract infection) ? ?#Acute on chronic sacral wound infection ?#Hx chronic osteomyelitis ?#L1 SCI with paraplegia w/ colostomy and indwelling catheter ?Medically stable for discharge.  Over the past few days, we have been working extensively with our case management team to find an option for the patient for his wound care.  Unfortunately, we are unable to send the patient to SNF as there are  no accepting facilities at this time. There are no HHC options that will accept the patient either.  Our only option is a wound care center in [place] that can provide once weekly services.  We have also arranged transportation for the patient if he were to choose this option.  However, he continues to refuse this even after we discussed that we have exhausted all of our other options.  Palliative care will evaluate the pt today to see if there are any other options, however unfortunately anticipate that the patient will return home with personal care services. Likely discharge later today. ?-Completed Levaquin/Course of antibiotics ?-Daily wound care ?-TOC consult, appreciate assistance with placement ?-TOC airflow bed delivered. Ensure Enlive twice daily and MVI ? ?#Acute L wrist pain, improving  ?Allopurinol was recently held in the setting of AKI, and the patient subsequently developed significant left wrist pain. He is status post a course of colchicine. Allopurinol was restarted on 04/30. Recent left wrist films show mild OA. Pain likely 2/2 gout flare given response to therapies given so far.  ?-Continue allopurinol ? ?#Acute right hip pain ?On 4/18, patient reported acute right hip pain that began during wound care as patient was logrolled.  XR of the hip showed moderate OA. Low suspicion for acute process. Managing supportively.  ?-Avoid movements that exacerbate pain.  ? ?#Chronic constipation, neurogenic bowel ?Has good ostomy output.  ?On  miralax BID, senna BID, and milk of magnesia. ? ?#CAUTI, resolved ?Patient has chronic indwelling Foley which was replaced at admission. Source control achieved. Likely has chronic colonization.   ? ?#Hypotension, resolved ?#History of HTN ?Hypotension likely 2/2 hypovolemia  and infection. Hypotension improved after fluid resuscitation. SBPs currently in the 90s-100s. Holding home Olmesartan-amlodipine-HCTZ 40-10-25 mg daily. ? ?#Acute on chronic normocytic  anemia ?#History of IDA ?Hemoglobin on admission 9.5 with baseline ~10. No sources of bleeding identified.  Suspect decrease in hemoglobin on 4/20 to ~7.0 secondary to fluid resuscitation. Received 1uRBCs on 4/20 with improvement in Hgb. Stable.  ?Plan: ?-Trend CBC ?-Transfuse for hemoglobin less than 7 ?-Iron supplementation ? ?#Opioid use disorder ?Patient has history of opiate overdose in 2019 for which he had an ICU admission.  At that time patient was deemed high risk for future opioid misuse.  Patient will need to continue on methadone for chronic opioid use disorder.  He attends [facility] and obtains weekly prescriptions from their providers. ?Plan: ?-Continue on home methadone '50mg'$  daily.   ? ?#OSA ?Continue on CPAP nightly. ? ?#GERD ?Continue on home famotidine '40mg'$  daily. ? ?#Right lower extremity spasticity ?Patient endorses right lower extremity pain with tightness of the muscles and contractures.  Suspect spasticity secondary to spinal cord injury.  Patient reports he has tried muscle relaxer such as baclofen in the past to minimal effect. Recommend PT.  ?Plan: ?-PT as above ? ?#Hypospadias ?#Posterior urethral valves ?Patient saw urology February 2022 Dr. Leory Plowman. They had a discussion about urethroplasty. Discussed patient would need suprapubic catheter for 3 to 6 months after repair before he could proceed with CIC.  Prior to surgery patient would need preop evaluation and indwelling catheter changes monthly until surgery.  At this time patient was eager to proceed with surgery. Unfortunately patient has been hospitalized twice since then.   ?-Patient needs urology outpatient visit scheduled before discharge for further discussion of surgical candidacy ? ?Diet: Regular  ?VTE: Xarelto at VTE ppx dose ?IVF: None ?Code: Full ? ?Prior to Admission Living Arrangement: Home ?Anticipated Discharge Location: Home ?Barriers to Discharge: Palliative care evaluation ?Dispo: Anticipated  discharge in approximately 0-1 day(s).  ? ?Mitzie Na, MD PGY-1 ?Internal Medicine  ?[phone] ?
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
## 2022-02-04 DIAGNOSIS — N390 Urinary tract infection, site not specified: Secondary | ICD-10-CM | POA: Diagnosis not present

## 2022-02-04 NOTE — Progress Notes (Addendum)
? ?Subjective:  ?Overnight Events: No acute events or concerns overnight. ? ?Patient was seen at bedside during rounds this AM.  He was noted to be sleeping with his CPAP in place.  Patient woke up for [place], denied any complaints or concerns today. ? ?Objective: ? ?Vital signs in last 24 hours: ?Vitals:  ? 02/03/22 1647 02/03/22 2057 02/04/22 0455 02/04/22 1004  ?BP: (!) 103/52 (!) 102/56 (!) 95/50 117/69  ?Pulse: 77 84 72 74  ?Resp: '14 18 15 19  '$ ?Temp: 98.2 ?F (36.8 ?C) 98.2 ?F (36.8 ?C) 98.7 ?F (37.1 ?C)   ?TempSrc: Oral Oral    ?SpO2: 100% 100% 99% 100%  ?Weight:      ?Height:      ? ? ?Intake/Output Summary (Last 24 hours) at 02/04/2022 1009 ?Last data filed at 02/04/2022 8182 ?Gross per 24 hour  ?Intake --  ?Output 2050 ml  ?Net -2050 ml  ? ? ?Constitutional: Obese and in no acute distress.  ?Head: Normocephalic and atraumatic.  ?Eyes: R prosthetic eye, L eye anicteric ?Neck: Normal range of motion.  ?Cardiovascular: Distant heart sounds due to body habitus.  ?Pulmonary: Non labored breathing on room air. ?Abdominal: Soft. Normal bowel sounds. Non distended, non-tender ?Neurological: Alert and oriented to person, place, and time. No focal deficits appreciated. ?Skin: Skin is warm and dry.  ? ?Assessment/Plan: ? ?Principal Problem: ?  Complicated UTI (urinary tract infection) ? ?#Acute on chronic sacral wound infection ?#Hx chronic osteomyelitis ?#L1 SCI with paraplegia w/ colostomy and indwelling catheter ?Medically stable for discharge.  Over the past few days, we have been working extensively with our case management team to find an option for the patient for his wound care.  Unfortunately, we are unable to send the patient to SNF as there are no accepting facilities at this time. There are no HHC options that will accept the patient either.  Our only option is a wound care center in [place] that can provide once weekly services.  We have also arranged transportation for the patient if he were to choose this  option.  However, he continues to refuse this even after we discussed that we have exhausted all of our other options.  Palliative care has evaluated the patient and are recommending OP palliative care, however otherwise unfortunately anticipate that the patient will return home with personal care services. Likely discharge on Monday. ?-Palliative care is on board, appreciate their recommendations ?-Completed Levaquin/Course of antibiotics ?-Daily wound care ?-TOC consult, appreciate assistance with placement ?-TOC airflow bed delivered. Ensure Enlive twice daily and MVI ? ?#Acute L wrist pain, improving  ?Allopurinol was recently held in the setting of AKI, and the patient subsequently developed significant left wrist pain. He is status post a course of colchicine. Allopurinol was restarted on 04/30. Recent left wrist films show mild OA. Pain likely 2/2 gout flare given response to therapies given so far.  ?-Continue allopurinol ? ?#Acute right hip pain ?On 4/18, patient reported acute right hip pain that began during wound care as patient was logrolled.  XR of the hip showed moderate OA. Low suspicion for acute process. Managing supportively.  ?-Avoid movements that exacerbate pain. ? ?#Chronic constipation, neurogenic bowel ?Has good ostomy output.  ?On miralax BID, senna BID, and milk of magnesia. ? ?#CAUTI, resolved ?Patient has chronic indwelling Foley which was replaced at admission. Source control achieved. Likely has chronic colonization. ? ?#Hypotension, resolved ?#History of HTN ?Hypotension likely 2/2 hypovolemia  and infection. Hypotension improved after fluid resuscitation. SBPs  currently in the 90s-100s. Holding home Olmesartan-amlodipine-HCTZ 40-10-25 mg daily. ? ?#Acute on chronic normocytic anemia ?#History of IDA ?Hemoglobin on admission 9.5 with baseline ~10. No sources of bleeding identified.  Suspect decrease in hemoglobin on 4/20 to ~7.0 secondary to fluid resuscitation. Received 1uRBCs on  4/20 with improvement in Hgb. Stable.  ?-Trend CBC ?-Transfuse for hemoglobin less than 7 ?-Iron supplementation ? ?#Opioid use disorder ?Patient has history of opiate overdose in 2019 for which he had an ICU admission.  At that time patient was deemed high risk for future opioid misuse.  Patient will need to continue on methadone for chronic opioid use disorder.  He attends [facility] and obtains weekly prescriptions from their providers. ?-Continue on home methadone '50mg'$  daily.   ? ?#OSA ?Continue on CPAP nightly. ? ?#GERD ?Continue on home famotidine '40mg'$  daily. ? ?#Right lower extremity spasticity ?Patient endorses right lower extremity pain with tightness of the muscles and contractures.  Suspect spasticity secondary to spinal cord injury.  Patient reports he has tried muscle relaxer such as baclofen in the past to minimal effect. Recommend PT.  ?-PT as above ? ?#Hypospadias ?#Posterior urethral valves ?Patient saw urology February 2022 Dr. Leory Plowman. They had a discussion about urethroplasty. Discussed patient would need suprapubic catheter for 3 to 6 months after repair before he could proceed with CIC.  Prior to surgery patient would need preop evaluation and indwelling catheter changes monthly until surgery.  At this time patient was eager to proceed with surgery. Unfortunately patient has been hospitalized twice since then.   ?-Patient needs urology outpatient visit scheduled before discharge for further discussion of surgical candidacy ? ?Diet: Regular  ?VTE: Xarelto at VTE ppx dose ?IVF: None ?Code: Full ? ?Prior to Admission Living Arrangement: Home ?Anticipated Discharge Location: Home ?Barriers to Discharge: Dispo planning ?Dispo: Anticipated discharge in approximately 2 days ? ?Mitzie Na, MD PGY-1 ?Internal Medicine  ?[phone] ?

## 2022-02-04 NOTE — Progress Notes (Signed)
Pt states he will place himself on CPAP when ready.  °

## 2022-02-05 DIAGNOSIS — Z515 Encounter for palliative care: Secondary | ICD-10-CM | POA: Diagnosis not present

## 2022-02-05 LAB — CBC
HCT: 23.3 % — ABNORMAL LOW (ref 39.0–52.0)
Hemoglobin: 7 g/dL — ABNORMAL LOW (ref 13.0–17.0)
MCH: 27.7 pg (ref 26.0–34.0)
MCHC: 30 g/dL (ref 30.0–36.0)
MCV: 92.1 fL (ref 80.0–100.0)
Platelets: 301 10*3/uL (ref 150–400)
RBC: 2.53 MIL/uL — ABNORMAL LOW (ref 4.22–5.81)
RDW: 17.3 % — ABNORMAL HIGH (ref 11.5–15.5)
WBC: 6.9 10*3/uL (ref 4.0–10.5)
nRBC: 0 % (ref 0.0–0.2)
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
## 2022-02-05 NOTE — Progress Notes (Signed)
? ?Subjective:  ?Overnight Events: No acute events or concerns overnight. ? ?Patient was seen at bedside during rounds this AM.  He feels his R knee pain is improved with ice pack. He spoke with his care giver and his care giver is willing to assist with daily wound dressing changes as long as he can get explicit instructions for this. Discussed with patient that we will write these down and record a video.  ? ?Objective: ? ?Vital signs in last 24 hours: ?Vitals:  ? 02/04/22 1651 02/04/22 2132 02/05/22 0619 02/05/22 0909  ?BP: 119/69 (!) 100/51 (!) 90/57 113/60  ?Pulse: 83 89 74 69  ?Resp: '18 18 18 18  '$ ?Temp: 98.2 ?F (36.8 ?C) 99.2 ?F (37.3 ?C) (!) 97.5 ?F (36.4 ?C) 98.4 ?F (36.9 ?C)  ?TempSrc: Oral   Oral  ?SpO2: 99% 97% 99% 99%  ?Weight:      ?Height:      ? ? ?Intake/Output Summary (Last 24 hours) at 02/05/2022 0959 ?Last data filed at 02/05/2022 0900 ?Gross per 24 hour  ?Intake 1117 ml  ?Output 3600 ml  ?Net -2483 ml  ? ? ?Constitutional: Obese and in no acute distress.  ?Head: Normocephalic and atraumatic.  ?Eyes: R prosthetic eye, L eye anicteric ?Neck: Normal range of motion.  ?Cardiovascular: Distant heart sounds due to body habitus.  ?Pulmonary: Non labored breathing on room air. ?Abdominal: Soft. Normal bowel sounds. Non distended, non-tender ?Neurological: Alert and oriented to person, place, and time. No focal deficits appreciated. ?Skin: Skin is warm and dry.  ? ? ?Constitutional: Obese and in no distress.  ?HENT:  ?Head: Normocephalic and atraumatic.  ?Eyes: R prosthetic eye, L eye anicteric  ?Neck: Normal range of motion.  ?Cardiovascular: Normal rate, regular rhythm, intact distal pulses. No gallop and no friction rub.  ?No murmur heard. No lower extremity edema  ?Pulmonary: Non labored breathing on room air, no wheezing or rales  ?Abdominal: Soft. Normal bowel sounds. Non distended and non tender ?Musculoskeletal: Normal range of motion.     ?   General: No tenderness or edema.  ?Neurological: Alert  and oriented to person, place, and time. Paraplegic ?Skin: Skin is warm and dry.  ? ?Assessment/Plan: ? ?Principal Problem: ?  Complicated UTI (urinary tract infection) ? ?#Acute on chronic sacral wound infection ?#Hx chronic osteomyelitis ?#L1 SCI with paraplegia w/ colostomy and indwelling catheter ?Medically stable for discharge.  Over the past few days, we have been working extensively with our case management team to find an option for the patient for his wound care.  Unfortunately, we are unable to send the patient to SNF as there are no accepting facilities at this time. There are no HHC options that will accept the patient either.  Our only option is a wound care center in [place] that can provide once weekly services.  We have also arranged transportation for the patient if he were to choose this option.  However, he continues to refuse this even after we discussed that we have exhausted all of our other options.  Palliative care has evaluated the patient and are recommending OP palliative care, however otherwise unfortunately anticipate that the patient will return home with personal care services. Likely discharge on Monday. ?-Palliative care is on board, appreciate their recommendations ?-Will see if [facility] facility will still accept patient and if transportation can be arranged ?-Will print out wound care instructions and record video of wound being changed for patient's caregiver.  ?-Completed Levaquin/Course of antibiotics ?-Daily wound care ?-  TOC consult, appreciate assistance with placement ?-TOC airflow bed delivered. Ensure Enlive twice daily and MVI ? ?#Acute L wrist pain, improving  ?Allopurinol was recently held in the setting of AKI, and the patient subsequently developed significant left wrist pain. He is status post a course of colchicine. Allopurinol was restarted on 04/30. Recent left wrist films show mild OA. Pain likely 2/2 gout flare given response to therapies given so far.   ?-Continue allopurinol ? ?#Acute right hip pain ?On 4/18, patient reported acute right hip pain that began during wound care as patient was logrolled.  XR of the hip showed moderate OA. Low suspicion for acute process. Managing supportively.  ?-Avoid movements that exacerbate pain. ? ?#Chronic constipation, neurogenic bowel ?Has good ostomy output.  ?On miralax BID, senna BID, and milk of magnesia. ? ?#CAUTI, resolved ?Patient has chronic indwelling Foley which was replaced at admission. Source control achieved. Likely has chronic colonization. ? ?#Hypotension, resolved ?#History of HTN ?Hypotension likely 2/2 hypovolemia  and infection. Hypotension improved after fluid resuscitation. SBPs currently in the 90s-100s. Holding home Olmesartan-amlodipine-HCTZ 40-10-25 mg daily. ? ?#Acute on chronic normocytic anemia ?#History of IDA ?Hemoglobin on admission 9.5 with baseline ~10. No sources of bleeding identified.  Suspect decrease in hemoglobin on 4/20 to ~7.0 secondary to fluid resuscitation. Received 1uRBCs on 4/20 with improvement in Hgb. Stable.  ?-Trend CBC ?-Transfuse for hemoglobin less than 7 ?-Iron supplementation ? ?#Opioid use disorder ?Patient has history of opiate overdose in 2019 for which he had an ICU admission.  At that time patient was deemed high risk for future opioid misuse.  Patient will need to continue on methadone for chronic opioid use disorder.  He attends [facility] and obtains weekly prescriptions from their providers. ?-Continue on home methadone '50mg'$  daily.   ? ?#OSA ?Continue on CPAP nightly. ? ?#GERD ?Continue on home famotidine '40mg'$  daily. ? ?#Right lower extremity spasticity ?Patient endorses right lower extremity pain with tightness of the muscles and contractures.  Suspect spasticity secondary to spinal cord injury.  Patient reports he has tried muscle relaxer such as baclofen in the past to minimal effect. Recommend PT.  ?-PT as above ? ?#Hypospadias ?#Posterior  urethral valves ?Patient saw urology February 2022 Dr. Leory Plowman. They had a discussion about urethroplasty. Discussed patient would need suprapubic catheter for 3 to 6 months after repair before he could proceed with CIC.  Prior to surgery patient would need preop evaluation and indwelling catheter changes monthly until surgery.  At this time patient was eager to proceed with surgery. Unfortunately patient has been hospitalized twice since then.   ?-Patient needs urology outpatient visit scheduled before discharge for further discussion of surgical candidacy ? ?Diet: Regular  ?VTE: Xarelto at VTE ppx dose ?IVF: None ?Code: Full ? ?Prior to Admission Living Arrangement: Home ?Anticipated Discharge Location: Home ?Barriers to Discharge: Dispo planning ?Dispo: Anticipated discharge in approximately 2 days ? ?[name] Duff, MD PGY-2 ?Internal Medicine  ?Pager [phone] ? ?

## 2022-02-06 DIAGNOSIS — L89154 Pressure ulcer of sacral region, stage 4: Secondary | ICD-10-CM | POA: Diagnosis not present

## 2022-02-06 DIAGNOSIS — N390 Urinary tract infection, site not specified: Secondary | ICD-10-CM | POA: Diagnosis not present

## 2022-02-06 LAB — CBC
HCT: 24.7 % — ABNORMAL LOW (ref 39.0–52.0)
Hemoglobin: 7.2 g/dL — ABNORMAL LOW (ref 13.0–17.0)
MCH: 27.6 pg (ref 26.0–34.0)
MCHC: 29.1 g/dL — ABNORMAL LOW (ref 30.0–36.0)
MCV: 94.6 fL (ref 80.0–100.0)
Platelets: 329 10*3/uL (ref 150–400)
RBC: 2.61 MIL/uL — ABNORMAL LOW (ref 4.22–5.81)
RDW: 17.2 % — ABNORMAL HIGH (ref 11.5–15.5)
WBC: 6.5 10*3/uL (ref 4.0–10.5)
nRBC: 0.3 % — ABNORMAL HIGH (ref 0.0–0.2)

## 2022-02-06 NOTE — Progress Notes (Signed)
Pt states he will place himself on CPAP. ?
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
## 2022-02-06 NOTE — Discharge Instructions (Addendum)
The National Pressure Injury Advisory Panel (NPIAP) recommends pressure redistribution while sitting: push up or lift using arms with the feet on footrests or floor every 30 minutes for at least 30 seconds or forward lean, or side lean for same amount of time. Seating is limited to three times per day for 60 minutes or less. The use of a good quality pressure redistribution chair pad is essential. And offloading the entire time in bed. ? ? ?You were seen in the hospital for wound infection.  Prior to discharge, we arranged for your home aide to perform wound dressing changes in between your appointments at the wound care center in [place].  I am attaching the wound care instructions below for ease of access.  For your arthritis in your hip, we are prescribing you ibuprofen 400 mg every 8 hours as needed for the next 2 weeks.  Please follow-up in our clinic in 1-2 weeks to discuss your recent hospitalization.  If any symptoms change or worsen acutely, please return to the nearest emergency department.  ? ? ?Wound Care: ?  ?Wound care to buttocks, posterior thigh, perineal, scrotal area chronic areas of pressure injury (Stage 4) post skin flap:  Cleanse with soap and water, rinse with normal saline, pat dry. Cover affected areas with silver hydrofiber (Aquacel Ag+, Lawson # [id number]), tuck into crevices using cotton tipped applicator where able. Top dressings with ABD pads and secure with paper tape. Turn patient from side to side. No disposable briefs or under pads. ?  ?Ostomy Care ?  ?Empty pouch when 1/3  to ? full of stool/flatus ?Clean bottom 2-inches of fecal pouch prior to resealing ?Assist patient in emptying pouch if it cannot be performed independently ?Change pouch twice weekly and as needed.  Write date of pouch application on pouch. ?Apply skin barrier ring to clean skin, place skin barrier on top of skin barrier ring and attach pouch to skin barrier. ?Have spare pouch at bedside at all times ?Order  Supplies: 2-piece 2 and 3/4 inch ostomy pouching system with skin barrier ring.   ?

## 2022-02-06 NOTE — Progress Notes (Signed)
Pt stated he can place himself on cpap. ?

## 2022-02-06 NOTE — Progress Notes (Signed)
? ?Subjective:  ?Overnight Events: No acute events or concerns overnight. ? ?Patient was seen at bedside during rounds this AM.  He feels his R knee pain is improved with ice pack. He spoke with his care giver and his care giver is willing to assist with daily wound dressing changes as long as he can get explicit instructions for this. No other complaints or concerns today. ? ?Objective: ? ?Vital signs in last 24 hours: ?Vitals:  ? 02/05/22 1701 02/05/22 2119 02/06/22 0552 02/06/22 0949  ?BP: (!) 101/36 (!) 96/50 (!) 101/58 (!) 131/47  ?Pulse: 74 73 72 77  ?Resp: '17 18 18 19  '$ ?Temp: 98.2 ?F (36.8 ?C) 98.8 ?F (37.1 ?C) 98.4 ?F (36.9 ?C) 98.6 ?F (37 ?C)  ?TempSrc: Oral  Oral Oral  ?SpO2: 98% 98% 99% 100%  ?Weight:      ?Height:      ? ? ?Intake/Output Summary (Last 24 hours) at 02/06/2022 1402 ?Last data filed at 02/06/2022 0850 ?Gross per 24 hour  ?Intake 600 ml  ?Output 2675 ml  ?Net -2075 ml  ? ? ?Constitutional: Obese and in no distress.  ?HENT:  ?Head: Normocephalic and atraumatic.  ?Eyes: R prosthetic eye, L eye anicteric  ?Neck: Normal range of motion.  ?Cardiovascular: Normal rate, regular rhythm, intact distal pulses. No gallop and no friction rub.  ?No murmur heard. No lower extremity edema  ?Pulmonary: Non labored breathing on room air, no wheezing or rales  ?Abdominal: Soft. Normal bowel sounds. Non distended and non tender ?Musculoskeletal: Normal range of motion.     ?   General: No tenderness or edema.  ?Neurological: Alert and oriented to person, place, and time. Paraplegic ?Skin: Skin is warm and dry.  ? ?Assessment/Plan: ? ?Principal Problem: ?  Complicated UTI (urinary tract infection) ? ?#Acute on chronic sacral wound infection ?#Hx chronic osteomyelitis ?#L1 SCI with paraplegia w/ colostomy and indwelling catheter ?Medically stable for discharge. Over the past few days, we have been working extensively with our case management team to find an option for the patient for his wound care. Unfortunately,  we are unable to send the patient to SNF as there are no accepting facilities at this time. There are no HHC options that will accept the patient either. Our only option is a wound care center in [place] that can provide once weekly services, which patient has agreed to. His home aide will be given instructions on how to perform dressing changes between these weekly visits. Likely discharge later today vs tomorrow. ?-Palliative care is on board, appreciate their recommendations ?-Will print out wound care instructions and record video of wound being changed for patient's caregiver.  ?-Completed Levaquin/Course of antibiotics ?-Daily wound care ?-TOC consult, appreciate assistance with placement ?-TOC airflow bed delivered. Ensure Enlive twice daily and MVI ? ?#Acute L wrist pain, improving  ?Allopurinol was recently held in the setting of AKI, and the patient subsequently developed significant left wrist pain. He is status post a course of colchicine. Allopurinol was restarted on 04/30. Recent left wrist films show mild OA. Pain likely 2/2 gout flare given positive response to therapies given so far.  ?-Continue allopurinol ? ?#Acute right hip pain ?On 4/18, patient reported acute right hip pain that began during wound care as patient was logrolled. XR of the hip showed moderate OA. Low suspicion for acute process. Managing supportively.  ?-Avoid movements that exacerbate pain. ? ?#Chronic constipation, neurogenic bowel ?Has good ostomy output.  ?On miralax BID, senna BID, and milk  of magnesia. ? ?#CAUTI, resolved ?Patient has chronic indwelling Foley which was replaced at admission. Source control achieved. Likely has chronic colonization. ? ?#Hypotension, resolved ?#History of HTN ?Hypotension likely 2/2 hypovolemia  and infection. Hypotension improved after fluid resuscitation. SBPs currently in the 90s-100s. Holding home Olmesartan-amlodipine-HCTZ 40-10-25 mg daily. ? ?#Acute on chronic normocytic  anemia ?#History of IDA ?Hemoglobin on admission 9.5 with baseline ~10. No sources of bleeding identified.  Suspect decrease in hemoglobin on 4/20 to ~7.0 secondary to fluid resuscitation. Received 1uRBCs on 4/20 with improvement in Hgb. Stable.  ?-Trend CBC ?-Transfuse for hemoglobin less than 7 ?-Iron supplementation ? ?#Opioid use disorder ?Patient has history of opiate overdose in 2019 for which he had an ICU admission.  At that time patient was deemed high risk for future opioid misuse.  Patient will need to continue on methadone for chronic opioid use disorder.  He attends [facility] and obtains weekly prescriptions from their providers. ?-Continue on home methadone '50mg'$  daily.   ? ?#OSA ?Continue on CPAP nightly. ? ?#GERD ?Continue on home famotidine '40mg'$  daily. ? ?#Right lower extremity spasticity ?Patient endorses right lower extremity pain with tightness of the muscles and contractures. Suspect spasticity secondary to spinal cord injury.  Patient reports he has tried muscle relaxer such as baclofen in the past to minimal effect. Recommend PT.  ?-PT as above ? ?#Hypospadias ?#Posterior urethral valves ?Patient saw urology February 2022 Dr. Leory Plowman. They had a discussion about urethroplasty. Appointment with urology scheduled for later this month, plan for surgery on June 2nd. ? ?Diet: Regular  ?VTE: Xarelto at VTE ppx dose ?IVF: None ?Code: Full ? ?Prior to Admission Living Arrangement: Home ?Anticipated Discharge Location: Home ?Barriers to Discharge: Dispo planning ?Dispo: Anticipated discharge in approximately 0-1 days ? ?[name] Duff, MD PGY-2 ?Internal Medicine  ?Pager [phone] ? ?

## 2022-02-07 DIAGNOSIS — N390 Urinary tract infection, site not specified: Secondary | ICD-10-CM | POA: Diagnosis not present

## 2022-02-07 NOTE — Progress Notes (Signed)
Pt. Places cpap on himself when ready. ?

## 2022-02-07 NOTE — TOC Progression Note (Signed)
Transition of Care (TOC) - Progression Note  ? ? ?Patient Details  ?Name: [name] ?MRN: [id number] ?Date of Birth: August 31, 1966 ? ?Transition of Care (TOC) CM/SW Contact  ?Tom-Johnson, Renea Ee, RN ?Phone Number: ?02/07/2022, 4:36 PM ? ?Clinical Narrative:    ? ?Medicaid transportation form filled out by patient and MD and CM faxed to [facility] [phone] & [phone]). Patient states his caregiver will not be available till Thursday to start hs care. MD aware. CM will continue to follow with needs.      ? ?  ?Barriers to Discharge: No Home Care Agency will accept this patient ? ?Expected Discharge Plan and Services ?  ?  ?Discharge Planning Services: CM Consult ?Post Acute Care Choice: Home Health, Long Term Acute Care (LTAC) ?Living arrangements for the past 2 months: Apartment ?                ?  ?  ?  ?  ?  ?  ?  ?  ?  ?  ? ? ?Social Determinants of Health (SDOH) Interventions ?  ? ?Readmission Risk Interventions ?   ? View : No data to display.  ?  ?  ?  ? ? ?
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
## 2022-02-07 NOTE — TOC Progression Note (Signed)
Transition of Care (TOC) - Initial/Assessment Note  ? ? ?Patient Details  ?Name: [name] ?MRN: [id number] ?Date of Birth: 11-23-1965 ? ?Transition of Care (TOC) CM/SW Contact:    ?Paulene Floor Anihya Tuma, LCSWA ?Phone Number: ?02/07/2022, 10:25 AM ? ?Clinical Narrative:                 ?CSW met with patient at bedside to request that the patient contact his home RN and have them contact RNCM to receive wound care instructions.  Patient sent a message to home RN with this request.  Patient also gave CSW the contact information for home RN.  ([name]- [phone]) ? ?  ?Barriers to Discharge: No Home Care Agency will accept this patient ? ? ?Patient Goals and CMS Choice ?Patient states their goals for this hospitalization and ongoing recovery are:: To get better and return home ?CMS Medicare.gov Compare Post Acute Care list provided to:: Patient ?Choice offered to / list presented to : Patient ? ?Expected Discharge Plan and Services ?  ?  ?Discharge Planning Services: CM Consult ?Post Acute Care Choice: Home Health, Long Term Acute Care (LTAC) ?Living arrangements for the past 2 months: Apartment ?                ?  ?  ?  ?  ?  ?  ?  ?  ?  ?  ? ?Prior Living Arrangements/Services ?Living arrangements for the past 2 months: Apartment ?Lives with:: Siblings (Brother) ?Patient language and need for interpreter reviewed:: Yes ?Do you feel safe going back to the place where you live?: Yes      ?Need for Family Participation in Patient Care: Yes (Comment) ?Care giver support system in place?: Yes (comment) ?Current home services: Biomedical scientist (Personal) ?Criminal Activity/Legal Involvement Pertinent to Current Situation/Hospitalization: No - Comment as needed ? ?Activities of Daily Living ?Home Assistive Devices/Equipment: None ?ADL Screening (condition at time of admission) ?Patient's cognitive ability adequate to safely complete daily activities?: Yes ?Is the patient deaf or have difficulty hearing?: No ?Does the  patient have difficulty seeing, even when wearing glasses/contacts?: Yes (R eye prosthetic) ?Does the patient have difficulty concentrating, remembering, or making decisions?: No ?Patient able to express need for assistance with ADLs?: Yes ?Does the patient have difficulty dressing or bathing?: Yes ?Independently performs ADLs?: No ?Does the patient have difficulty walking or climbing stairs?: Yes ?Weakness of Legs: Both ?Weakness of Arms/Hands: None ? ?Permission Sought/Granted ?Permission sought to share information with : Case Manager, Customer service manager, Family Supports ?Permission granted to share information with : Yes, Verbal Permission Granted ?   ?   ?   ?   ? ?Emotional Assessment ?Appearance:: Appears stated age ?Attitude/Demeanor/Rapport: Engaged ?Affect (typically observed): Frustrated, Hopeful ?Orientation: : Oriented to Self, Oriented to Place, Oriented to  Time, Oriented to Situation ?Alcohol / Substance Use: Not Applicable ?Psych Involvement: No (comment) ? ?Admission diagnosis:  Wound infection [T14.8XXA, L08.9] ?Complicated UTI (urinary tract infection) [N39.0] ?Sepsis, due to unspecified organism, unspecified whether acute organ dysfunction present ([place]) [A41.9] ?Patient Active Problem List  ? Diagnosis Date Noted  ? Complicated UTI (urinary tract infection) 01/15/2022  ? Gout 12/30/2021  ? Morbid obesity with BMI of 40.0-44.9, adult ([place]) 12/22/2021  ? Intertriginous skin ulcer ([place]) 12/22/2021  ? Acute kidney injury ([place]) 12/20/2021  ? Small bowel obstruction ([place]) 10/13/2021  ? Colon cancer screening 05/20/2021  ? Bloating 04/11/2021  ? Chronic indwelling Foley catheter 04/01/2021  ? Orthopnea 04/01/2021  ?  Obstructive sleep apnea 02/24/2021  ? Anemia of chronic disease 02/24/2021  ? GERD (gastroesophageal reflux disease) 02/24/2021  ? Polysubstance abuse ([place]) 07/09/2019  ? Substance induced mood disorder ([place]) 08/21/2018  ? Therapeutic opioid-induced constipation ([facility])  02/14/2017  ? Decubitus ulcer of sacral region, stage 4 ([place])   ? UTI (urinary tract infection)   ? Colostomy in place for fecal diversion 06/01/2016  ? Protein-calorie malnutrition, severe ([place]) 05/31/2016  ? Chronic pain 05/26/2016  ? Hypertension 05/26/2016  ? Paraplegia ([place])   ? ?PCP:  No primary care provider on file. ?Pharmacy:   ?[place], [place] ?[place] [address] ?Phone: [phone] Fax: [phone] ? ?[place], [place] [facility] [place] ?[place] ?[place] [address] ?Phone: [phone] Fax: [phone] ? ? ? ? ?Social Determinants of Health (SDOH) Interventions ?  ? ?Readmission Risk Interventions ?   ? View : No data to display.  ?  ?  ?  ? ? ? ?

## 2022-02-07 NOTE — Progress Notes (Signed)
? ?Subjective:  ?Overnight Events: No acute events or concerns overnight. ? ?Patient was seen at bedside during rounds this AM.  He feels his R knee pain has improved with ice pack. Agreeable to current plan. No other complaints or concerns today. ? ?Objective: ? ?Vital signs in last 24 hours: ?Vitals:  ? 02/06/22 1632 02/06/22 2022 02/07/22 0527 02/07/22 0933  ?BP: 99/60 (!) 91/50 (!) 86/55 (!) 90/49  ?Pulse: 82 87 74 69  ?Resp: '18 18 18 16  '$ ?Temp: 98.2 ?F (36.8 ?C) 98.7 ?F (37.1 ?C) 98.2 ?F (36.8 ?C) 98.2 ?F (36.8 ?C)  ?TempSrc: Oral Oral Oral Oral  ?SpO2: 96% 100% 98% 97%  ?Weight:      ?Height:      ? ? ?Intake/Output Summary (Last 24 hours) at 02/07/2022 1106 ?Last data filed at 02/07/2022 0700 ?Gross per 24 hour  ?Intake 700 ml  ?Output 1050 ml  ?Net -350 ml  ? ? ?Constitutional: Obese and in no distress.  ?HENT:  ?Head: Normocephalic and atraumatic.  ?Eyes: R prosthetic eye, L eye anicteric  ?Neck: Normal range of motion.  ?Cardiovascular: Normal rate, regular rhythm, intact distal pulses. No gallop and no friction rub.  ?No murmur heard. No lower extremity edema  ?Pulmonary: Non labored breathing on room air, no wheezing or rales  ?Abdominal: Soft. Normal bowel sounds. Non distended and non tender ?Musculoskeletal: Normal range of motion.     ?   General: No tenderness or edema.  ?Neurological: Alert and oriented to person, place, and time. Paraplegic. ?Skin: Skin is warm and dry.  ? ?Assessment/Plan: ? ?Principal Problem: ?  Complicated UTI (urinary tract infection) ? ?#Acute on chronic sacral wound infection ?#Hx chronic osteomyelitis ?#L1 SCI with paraplegia w/ colostomy and indwelling catheter ?Medically stable for discharge. Over the past few days, we have been working extensively with our case management team to find an option for the patient for his wound care. Unfortunately, we are unable to send the patient to SNF as there are no accepting facilities at this time. There are no HHC options that will  accept the patient either. Our only option is a wound care center in [place] that can provide once weekly services, which patient has agreed to. His home aide will be given instructions on how to perform dressing changes between these weekly visits. However, it appears that patient's home aide is unable to provide these services until Thursday, so we will need to ensure that the patient is safe for discharge first, likely tomorrow or Thursday. ?-Palliative care is on board, appreciate their recommendations ?-Completed Levaquin/Course of antibiotics ?-Daily wound care ?-TOC consult, appreciate assistance with placement ?-TOC airflow bed delivered. Ensure Enlive twice daily and MVI ? ?#Acute L wrist pain, improving  ?Allopurinol was recently held in the setting of AKI, and the patient subsequently developed significant left wrist pain. He is status post a course of colchicine. Allopurinol was restarted on 04/30. Recent left wrist films show mild OA. Pain likely 2/2 gout flare given positive response to therapies given so far.  ?-Continue allopurinol ? ?#Acute right hip pain ?On 4/18, patient reported acute right hip pain that began during wound care as patient was logrolled. XR of the hip showed moderate OA. Low suspicion for acute process. Managing supportively.  ?-Avoid movements that exacerbate pain. ? ?#Chronic constipation, neurogenic bowel ?Has good ostomy output.  ?On miralax BID, senna BID, and milk of magnesia. ? ?#CAUTI, resolved ?Patient has chronic indwelling Foley which was replaced at admission. Source  control achieved. Likely has chronic colonization. ? ?#Hypotension, resolved ?#History of HTN ?Hypotension likely 2/2 hypovolemia  and infection. Hypotension improved after fluid resuscitation. SBPs currently in the 90s-100s. Holding home Olmesartan-amlodipine-HCTZ 40-10-25 mg daily. ? ?#Acute on chronic normocytic anemia ?#History of IDA ?Hemoglobin on admission 9.5 with baseline ~10. No sources of  bleeding identified.  Suspect decrease in hemoglobin on 4/20 to ~7.0 secondary to fluid resuscitation. Received 1uRBCs on 4/20 with improvement in Hgb. Stable.  ?-Lab holiday today ?-Transfuse for hemoglobin less than 7 ?-Iron supplementation ? ?#Opioid use disorder ?Patient has history of opiate overdose in 2019 for which he had an ICU admission.  At that time patient was deemed high risk for future opioid misuse.  Patient will need to continue on methadone for chronic opioid use disorder.  He attends [facility] and obtains weekly prescriptions from their providers. ?-Continue on home methadone '50mg'$  daily.   ? ?#OSA ?Continue on CPAP nightly. ? ?#GERD ?Continue on home famotidine '40mg'$  daily. ? ?#Right lower extremity spasticity ?Patient endorses right lower extremity pain with tightness of the muscles and contractures. Suspect spasticity secondary to spinal cord injury.  Patient reports he has tried muscle relaxer such as baclofen in the past to minimal effect. Recommend PT.  ?-PT as above ? ?#Hypospadias ?#Posterior urethral valves ?Patient saw urology February 2022 Dr. Leory Plowman. They had a discussion about urethroplasty. His appointment with urology is scheduled for later this month with plan for surgery on June 2nd. ? ?Diet: Regular  ?VTE: Xarelto at VTE ppx dose ?IVF: None ?Code: Full ? ?Prior to Admission Living Arrangement: Home ?Anticipated Discharge Location: Home ?Barriers to Discharge: Dispo planning ?Dispo: Anticipated discharge in approximately 1-2 days ? ?Mitzie Na, MD PGY-1 ?Internal Medicine  ?Pager [phone] ? ?

## 2022-02-08 ENCOUNTER — Inpatient Hospital Stay (INDEPENDENT_AMBULATORY_CARE_PROVIDER_SITE_OTHER): Payer: Medicare Other | Admitting: Primary Care

## 2022-02-08 MED ORDER — KETOROLAC TROMETHAMINE 15 MG/ML IJ SOLN
15.0000 mg | Freq: Four times a day (QID) | INTRAMUSCULAR | Status: AC
  Administered 2022-02-08 – 2022-02-09 (×5): 15 mg via INTRAVENOUS
  Filled 2022-02-08 (×5): qty 1

## 2022-02-08 NOTE — Progress Notes (Signed)
? ?Subjective:  ?Overnight Events: No acute events or concerns overnight. ? ?Patient was seen at bedside during rounds this AM.  He endorses significant right hip pain today as well as right knee pain, though the knee pain improves with ice.  Patient mentions the hip x-ray he got last month showing osteoarthritis.  He is wondering if anything can be done for his hip in the meantime.  No other complaints or concerns today. ? ?Objective: ? ?Vital signs in last 24 hours: ?Vitals:  ? 02/07/22 1811 02/07/22 2044 02/08/22 0421 02/08/22 0853  ?BP: (!) 117/51 (!) 94/57 (!) 111/47 (!) 100/52  ?Pulse: 81 86 75 70  ?Resp: '15 18 16 16  '$ ?Temp: 98.3 ?F (36.8 ?C) 98.3 ?F (36.8 ?C) 98.3 ?F (36.8 ?C) 98.1 ?F (36.7 ?C)  ?TempSrc: Oral Oral  Oral  ?SpO2: 100% 97% 97% 99%  ?Weight:      ?Height:      ? ? ?Intake/Output Summary (Last 24 hours) at 02/08/2022 1450 ?Last data filed at 02/08/2022 1244 ?Gross per 24 hour  ?Intake 860 ml  ?Output 1675 ml  ?Net -815 ml  ? ? ?Constitutional: Obese and in no distress.  ?HENT:  ?Head: Normocephalic and atraumatic.  ?Eyes: R prosthetic eye, L eye anicteric  ?Neck: Normal range of motion.  ?Cardiovascular: Normal rate, regular rhythm, intact distal pulses. No gallop and no friction rub.  ?No murmur heard. No lower extremity edema  ?Pulmonary: Non labored breathing on room air, no wheezing or rales  ?Abdominal: Soft. Normal bowel sounds. Non distended and non tender ?Musculoskeletal: Normal range of motion.     ?   General: No tenderness or edema.  ?Neurological: Alert and oriented to person, place, and time. Paraplegic. ?Skin: Skin is warm and dry.  ? ?Assessment/Plan: ? ?Principal Problem: ?  Complicated UTI (urinary tract infection) ? ?#Acute on chronic sacral wound infection ?#Hx chronic osteomyelitis ?#L1 SCI with paraplegia w/ colostomy and indwelling catheter ?Medically stable for discharge. Over the past few days, we have been working extensively with our case management team to find an  option for the patient for his wound care. Unfortunately, we are unable to send the patient to SNF as there are no accepting facilities at this time. There are no HHC options that will accept the patient either. Our only option is a wound care center in [place] that can provide once weekly services, which patient has agreed to. His home aide will be given instructions on how to perform dressing changes between these weekly visits. However, it appears that patient's home aide is unable to provide these services until Thursday, so we will need to ensure that the patient is safe for discharge first, likely later today or tomorrow. ?-Palliative care is on board, appreciate their recommendations ?-Completed Levaquin/Course of antibiotics ?-Daily wound care ?-TOC consult, appreciate assistance with placement ?-TOC airflow bed delivered. Ensure Enlive twice daily and MVI ? ?#Acute right hip pain ?On 4/18, patient reported acute right hip pain that began during wound care as patient was logrolled. XR of the hip showed moderate OA. Low suspicion for acute process.  This was previously managed supportively, however patient states that his pain has especially been bothering him today.  Discussed x-ray findings with patient today.  Will give IV toradol today for 5 doses. ?-Toradol 15 mg IV every 6 hours ?-Avoid movements that exacerbate pain. ? ?#Acute L wrist pain, improving  ?Allopurinol was recently held in the setting of AKI, and the patient subsequently developed  significant left wrist pain. He is status post a course of colchicine. Allopurinol was restarted on 04/30. Recent left wrist films show mild OA. Pain likely 2/2 gout flare given positive response to therapies given so far.  ?-Continue allopurinol ? ?#Chronic constipation, neurogenic bowel ?Has good ostomy output.  ?On miralax BID, senna BID, and milk of magnesia. ? ?#CAUTI, resolved ?Patient has chronic indwelling Foley which was replaced at admission. Source  control achieved. Likely has chronic colonization. ? ?#Hypotension, resolved ?#History of HTN ?Hypotension likely 2/2 hypovolemia  and infection. Hypotension improved after fluid resuscitation. SBPs currently in the 90s-100s. Holding home Olmesartan-amlodipine-HCTZ 40-10-25 mg daily. ? ?#Acute on chronic normocytic anemia ?#History of IDA ?Hemoglobin on admission 9.5 with baseline ~10. No sources of bleeding identified.  Suspect decrease in hemoglobin on 4/20 to ~7.0 secondary to fluid resuscitation. Received 1uRBCs on 4/20 with improvement in Hgb. Stable.  ?-Lab holiday today ?-Transfuse for hemoglobin less than 7 ?-Iron supplementation ? ?#Opioid use disorder ?Patient has history of opiate overdose in 2019 for which he had an ICU admission.  At that time patient was deemed high risk for future opioid misuse.  Patient will need to continue on methadone for chronic opioid use disorder.  He attends [facility] and obtains weekly prescriptions from their providers. ?-Continue on home methadone '50mg'$  daily.   ? ?#OSA ?Continue on CPAP nightly. ? ?#GERD ?Continue on home famotidine '40mg'$  daily. ? ?#Right lower extremity spasticity ?Patient endorses right lower extremity pain with tightness of the muscles and contractures. Suspect spasticity secondary to spinal cord injury.  Patient reports he has tried muscle relaxer such as baclofen in the past to minimal effect. Recommend PT.  ?-PT as above ? ?#Hypospadias ?#Posterior urethral valves ?Patient saw urology February 2022 Dr. Leory Plowman. They had a discussion about urethroplasty. His appointment with urology is scheduled for later this month with plan for surgery on June 2nd. ? ?Diet: Regular  ?VTE: Xarelto at VTE ppx dose ?IVF: None ?Code: Full ? ?Prior to Admission Living Arrangement: Home ?Anticipated Discharge Location: Home ?Barriers to Discharge: Dispo planning ?Dispo: Anticipated discharge in approximately 0-1 days ? ?Mitzie Na, MD  PGY-1 ?Internal Medicine  ?Pager [phone] ? ?
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
## 2022-02-08 NOTE — Progress Notes (Signed)
Mobility Specialist: Progress Note ? ? 02/08/22 1558  ?Mobility  ?Activity Transferred from chair to bed  ?Level of Assistance +2 (takes two people)  ?Assistive Device MaxiMove  ?Activity Response Tolerated well  ?$Mobility charge 1 Mobility  ? ?Pt assisted back to bed per request from wheelchair. No c/o throughout. Pt back in bed with call bell and phone at his side.  ? ?Harrell Gave Ikram Riebe ?Mobility Specialist ?Mobility Specialist [place]: [phone] ?Mobility Specialist [place]: [phone] ? ?

## 2022-02-09 LAB — BASIC METABOLIC PANEL
Anion gap: 5 (ref 5–15)
BUN: 42 mg/dL — ABNORMAL HIGH (ref 6–20)
CO2: 28 mmol/L (ref 22–32)
Calcium: 8.4 mg/dL — ABNORMAL LOW (ref 8.9–10.3)
Chloride: 107 mmol/L (ref 98–111)
Creatinine, Ser: 1.12 mg/dL (ref 0.61–1.24)
GFR, Estimated: >60 mL/min (ref >60)
Glucose, Bld: 117 mg/dL — ABNORMAL HIGH (ref 70–99)
Potassium: 4 mmol/L (ref 3.5–5.1)
Sodium: 140 mmol/L (ref 135–145)

## 2022-02-09 MED ORDER — MAGNESIUM HYDROXIDE 400 MG/5ML PO SUSP
30.0000 mL | Freq: Every day | ORAL | 0 refills | Status: DC
Start: 2022-02-09 — End: 2024-04-03

## 2022-02-09 MED ORDER — SENNOSIDES-DOCUSATE SODIUM 8.6-50 MG PO TABS: 2.0000 | ORAL_TABLET | Freq: Two times a day (BID) | ORAL | 0 refills | Status: DC

## 2022-02-09 MED ORDER — FERROUS SULFATE 325 (65 FE) MG PO TABS
325.0000 mg | ORAL_TABLET | Freq: Every day | ORAL | 0 refills | Status: AC
Start: 2022-02-09 — End: ?

## 2022-02-09 MED ORDER — ADULT MULTIVITAMIN W/MINERALS CH: 1.0000 | ORAL_TABLET | Freq: Every day | ORAL | 0 refills | Status: AC

## 2022-02-09 MED ORDER — ENSURE ENLIVE PO LIQD: 237.0000 mL | Freq: Two times a day (BID) | ORAL | 12 refills | Status: AC

## 2022-02-09 MED ORDER — POLYETHYLENE GLYCOL 3350 17 G PO PACK
17.0000 g | PACK | Freq: Two times a day (BID) | ORAL | 0 refills | Status: DC
Start: 2022-02-09 — End: 2024-04-03

## 2022-02-09 MED ORDER — IBUPROFEN 400 MG PO TABS: 400.0000 mg | ORAL_TABLET | Freq: Three times a day (TID) | ORAL | 0 refills | Status: AC | PRN

## 2022-02-09 MED ORDER — JUVEN PO PACK: 1.0000 | PACK | Freq: Two times a day (BID) | ORAL | 0 refills | Status: AC

## 2022-02-09 MED ORDER — ASCORBIC ACID 500 MG PO TABS: 500.0000 mg | ORAL_TABLET | Freq: Two times a day (BID) | ORAL | 0 refills | Status: AC

## 2022-02-09 MED ORDER — CHLORHEXIDINE GLUCONATE CLOTH 2 % EX PADS: 6.0000 | MEDICATED_PAD | Freq: Every day | CUTANEOUS | 0 refills | Status: DC

## 2022-02-09 MED ORDER — ACETAMINOPHEN 500 MG PO TABS: 1000.0000 mg | ORAL_TABLET | Freq: Three times a day (TID) | ORAL | 0 refills | Status: AC | PRN

## 2022-02-09 NOTE — Progress Notes (Signed)
Mobility Specialist: Progress Note ? ? 02/09/22 1507  ?Mobility  ?Activity Transferred from bed to chair ?((wheelchair))  ?Level of Assistance +2 (takes two people)  ?Assistive Device [employer];Wheelchair  ?Activity Response Tolerated well  ?$Mobility charge 1 Mobility  ? ?Pt assisted to the wheelchair, no c/o throughout. Pt independently performing wheelchair mobility in the hallway. Will return to assist pt back to bed in 1 hour.  ? ?Harrell Gave Ezma Rehm ?Mobility Specialist ?Mobility Specialist [place]: [phone] ?Mobility Specialist [place]: [phone] ? ?

## 2022-02-09 NOTE — Progress Notes (Addendum)
Mobility Specialist: Progress Note ? ? 02/09/22 1636  ?Mobility  ?Activity Transferred from chair to bed ?((wheelchair))  ?Level of Assistance Total care  ?Assistive Device MaxiMove  ?Activity Response Tolerated well  ?$Mobility charge 1 Mobility  ? ?Pt assisted back to bed, no c/o throughout. Pt in bed with call and phone in his lap. All needs met.  ? ?[name] ?Mobility Specialist ?Mobility Specialist [place]: [phone] ?Mobility Specialist [place]: [phone] ? ?

## 2022-02-09 NOTE — Plan of Care (Signed)
  Problem: Activity: Goal: Risk for activity intolerance will decrease Outcome: Progressing   Problem: Elimination: Goal: Will not experience complications related to bowel motility Outcome: Progressing Goal: Will not experience complications related to urinary retention Outcome: Progressing   Problem: Safety: Goal: Ability to remain free from injury will improve Outcome: Progressing   

## 2022-02-09 NOTE — TOC Progression Note (Signed)
Transition of Care (TOC) - Initial/Assessment Note  ? ? ?Patient Details  ?Name: [name] ?MRN: [id number] ?Date of Birth: 1966-02-22 ? ?Transition of Care (TOC) CM/SW Contact:    ?Paulene Floor , LCSWA ?Phone Number: ?02/09/2022, 4:28 PM ? ?Clinical Narrative:                 ? CSW met with the patient at bedside to inquire about primary care physician and to inform the patient of discharge.  The patient reported that his primary care provider is Dr. Rico Junker.  The patient then inquired about discharge.  CSW informed the patient that the discharge order is in and that transportation will be called soon.  The patient reported that his aide will not be back until tomorrow and he does not have a key to get into his apartment because the aide has it. CSW informed RNCM who is following the patient of this information.   ? ?  ?Barriers to Discharge: No Home Care Agency will accept this patient ? ? ?Patient Goals and CMS Choice ?Patient states their goals for this hospitalization and ongoing recovery are:: To get better and return home ?CMS Medicare.gov Compare Post Acute Care list provided to:: Patient ?Choice offered to / list presented to : Patient ? ?Expected Discharge Plan and Services ?  ?  ?Discharge Planning Services: CM Consult ?Post Acute Care Choice: Home Health, Long Term Acute Care (LTAC) ?Living arrangements for the past 2 months: Apartment ?Expected Discharge Date: 02/06/22               ?  ?  ?  ?  ?  ?  ?  ?  ?  ?  ? ?Prior Living Arrangements/Services ?Living arrangements for the past 2 months: Apartment ?Lives with:: Siblings (Brother) ?Patient language and need for interpreter reviewed:: Yes ?Do you feel safe going back to the place where you live?: Yes      ?Need for Family Participation in Patient Care: Yes (Comment) ?Care giver support system in place?: Yes (comment) ?Current home services: Biomedical scientist (Personal) ?Criminal Activity/Legal Involvement Pertinent to Current  Situation/Hospitalization: No - Comment as needed ? ?Activities of Daily Living ?Home Assistive Devices/Equipment: None ?ADL Screening (condition at time of admission) ?Patient's cognitive ability adequate to safely complete daily activities?: Yes ?Is the patient deaf or have difficulty hearing?: No ?Does the patient have difficulty seeing, even when wearing glasses/contacts?: Yes (R eye prosthetic) ?Does the patient have difficulty concentrating, remembering, or making decisions?: No ?Patient able to express need for assistance with ADLs?: Yes ?Does the patient have difficulty dressing or bathing?: Yes ?Independently performs ADLs?: No ?Does the patient have difficulty walking or climbing stairs?: Yes ?Weakness of Legs: Both ?Weakness of Arms/Hands: None ? ?Permission Sought/Granted ?Permission sought to share information with : Case Manager, Customer service manager, Family Supports ?Permission granted to share information with : Yes, Verbal Permission Granted ?   ?   ?   ?   ? ?Emotional Assessment ?Appearance:: Appears stated age ?Attitude/Demeanor/Rapport: Engaged ?Affect (typically observed): Frustrated, Hopeful ?Orientation: : Oriented to Self, Oriented to Place, Oriented to  Time, Oriented to Situation ?Alcohol / Substance Use: Not Applicable ?Psych Involvement: No (comment) ? ?Admission diagnosis:  Wound infection [T14.8XXA, L08.9] ?Complicated UTI (urinary tract infection) [N39.0] ?Sepsis, due to unspecified organism, unspecified whether acute organ dysfunction present ([place]) [A41.9] ?Patient Active Problem List  ? Diagnosis Date Noted  ? Complicated UTI (urinary tract infection) 01/15/2022  ? Gout 12/30/2021  ? Morbid  obesity with BMI of 40.0-44.9, adult ([place]) 12/22/2021  ? Intertriginous skin ulcer ([place]) 12/22/2021  ? Acute kidney injury ([place]) 12/20/2021  ? Small bowel obstruction ([place]) 10/13/2021  ? Colon cancer screening 05/20/2021  ? Bloating 04/11/2021  ? Chronic indwelling Foley catheter  04/01/2021  ? Orthopnea 04/01/2021  ? Obstructive sleep apnea 02/24/2021  ? Anemia of chronic disease 02/24/2021  ? GERD (gastroesophageal reflux disease) 02/24/2021  ? Polysubstance abuse ([place]) 07/09/2019  ? Substance induced mood disorder ([place]) 08/21/2018  ? Therapeutic opioid-induced constipation ([facility]) 02/14/2017  ? Decubitus ulcer of sacral region, stage 4 ([place])   ? UTI (urinary tract infection)   ? Colostomy in place for fecal diversion 06/01/2016  ? Protein-calorie malnutrition, severe ([place]) 05/31/2016  ? Chronic pain 05/26/2016  ? Hypertension 05/26/2016  ? Paraplegia ([place])   ? ?PCP:  No primary care provider on file. ?Pharmacy:   ?[place] [address] ?[place] [address] ?Phone: [phone] Fax: [phone] ? ?[place] [place] [facility] [place] [address] ?[place] [address] ?Phone: [phone] Fax: [phone] ? ? ? ? ?Social Determinants of Health (SDOH) Interventions ?  ? ?Readmission Risk Interventions ?   ? View : No data to display.  ?  ?  ?  ? ? ? ?

## 2022-02-09 NOTE — Consult Note (Signed)
? ?  [facility] CM Inpatient Consult ? ? ?02/09/2022 ? ?[place] ?04/06/1966 ?[id number] ? ?[place] [facility] [ACO] Patient: [employer] ? ?Primary Care Provider:  No primary care provider on file., patient was previously listed with [facility] Internal Medicine on [facility] roster an Embedded provider noted ? ?*extreme high risk and LLOS 24 days ? ?  Review of patient's medical record reveals patient is home alone with aide assistance, reviewed Palliative consult, TOC review, ongoing barriers to post hospital wound care. ? ?Plan:  Reached out to inpatient [facility] for post hospital follow up needs and call attempts to the patient x 2 was not successful however was able to leave a HIPAA compliant request for a follow up return call. Reviewed with inpatient [facility] RNCM Marcella currently available on the unit regarding patient having a physician group that could potentially follow with home visits if the patient is willing. TOC RNCM was going to ask patient to return call to this Probation officer. ? ?[id number]: Call received from inpatient [facility] RN with the  patient. Spoke with the patient at length abut obtaining follow up with primary care providers. Patient speaks with this writer in a calm manner however upset that he states, "they just want me out of here and don't care about my concerns regarding my hip issues and my wound care." I just found out that I don't have a primary anymore and how can they just do that to me and not let me know. They're trying to say I dismissed myself and I did not. I had a valid concern about not getting an appointment until after I was hospitalized." Spoke with patient for potential follow up to make referral for providers that do home visits.   ? ?Plan: To continue to follow up with community care management for needs if he transitions and referral for provider if he doesn't maintain his provider at Internal Medicine. ?  ?For questions contact:  ? ?Natividad Brood, RN BSN CCM ?[facility] Liaison ? [phone] business mobile phone ?Toll free office [phone]  ?Fax number: [phone] ?[email or web address]  ? ? ? ?

## 2022-02-09 NOTE — TOC Progression Note (Signed)
Transition of Care (TOC) - Progression Note  ? ? ?Patient Details  ?Name: [name] ?MRN: [id number] ?Date of Birth: 1966/07/03 ? ?Transition of Care (TOC) CM/SW Contact  ?Tom-Johnson, Renea Ee, RN ?Phone Number: ?02/09/2022, 5:27 PM ? ?Clinical Narrative:    ? ?CM spoke with patient at bedside about discharge disposition. CM notified patient that d/c orders ar in and transportation scheduled with [facility]. Patient states he does not have a key to get in his apartment as his care giver has his keys and his caregiver will not be available till tomorrow, Friday. CM notified patient that this is the first time knowing about him not having his keys. ?CM notified Supervisor and agreed to cancel discharge till tomorrow. CM notified MD and patient will be discharge tomorrow safely.  ? ?CM notified by Natividad Brood, RN with [facility] Case Management that patient no longer has a PCP. Patient was active with Dr. [name] at Internal Medicine. CM called to verify if patient is inactive and was told that patient removed himself from their services. CM spoke with patient and he states he did not remove himself from their services. MD notified and new patient appointment was scheduled with [facility]. Info o AVS.  ?CM will continue to follow with needs. ? ?CM will continue to follow with needs.  ? ?  ?Barriers to Discharge: No Home Care Agency will accept this patient ? ?Expected Discharge Plan and Services ?  ?  ?Discharge Planning Services: CM Consult ?Post Acute Care Choice: Home Health, Long Term Acute Care (LTAC) ?Living arrangements for the past 2 months: Apartment ?Expected Discharge Date: 02/06/22               ?  ?  ?  ?  ?  ?  ?  ?  ?  ?  ? ? ?Social Determinants of Health (SDOH) Interventions ?  ? ?Readmission Risk Interventions ?   ? View : No data to display.  ?  ?  ?  ? ? ?

## 2022-02-09 NOTE — Discharge Summary (Addendum)
? ?Name: [name] ?MRN: [id number] ?DOB: [birth year] 56 y.o. ?PCP: No primary care provider on file. ? ?Date of Admission: 01/15/2022  3:09 PM ?Date of Discharge: 02/12/2022 ?Attending Physician: Velna Ochs, MD ? ?Discharge Diagnosis: ?1. Acute on chronic sacral wound infection ?2. Acute L wrist pain 2/2 gout flare, resolved ?3. Acute right hip pain 2/2 spastic paralysis ?4. CAUTI, resolved ?5. Hypotension, resolving ?6. Acute on chronic normocytic anemia ? ?Discharge Medications: ?Allergies as of 02/12/2022   ? ?   Reactions  ? Pork-derived Products Other (See Comments)  ? Don't eat pork  ? ?  ? ?  ?Medication List  ?  ? ?STOP taking these medications   ? ?fluconazole 150 MG tablet ?Commonly known as: DIFLUCAN ?  ?Olmesartan-amLODIPine-HCTZ 40-10-25 MG Tabs ?  ? ?  ? ?TAKE these medications   ? ?acetaminophen 500 MG tablet ?Commonly known as: TYLENOL ?Take 2 tablets (1,000 mg total) by mouth 3 (three) times daily as needed for mild pain or moderate pain (or Fever >/= 101). ?  ?allopurinol 100 MG tablet ?Commonly known as: Zyloprim ?Take 1 tablet (100 mg total) by mouth daily. ?  ?Anbesol 10 % mucosal gel ?Generic drug: benzocaine ?Use as directed 1 application. in the mouth or throat daily as needed for mouth pain. ?  ?ascorbic acid 500 MG tablet ?Commonly known as: VITAMIN C ?Take 1 tablet (500 mg total) by mouth 2 (two) times daily. ?What changed:  ?medication strength ?how much to take ?when to take this ?  ?aspirin EC 81 MG tablet ?Take 81 mg by mouth every morning. Swallow whole. ?  ?Blood Pressure Monitor Devi ?Please provide patient with insurance approved blood pressure monitor. ICD10 I.10 ?  ?CALCIUM-D PO ?Take 1 tablet by mouth every morning. ?  ?Chlorhexidine Gluconate Cloth 2 % Pads ?Apply 6 each topically daily. ?  ?clotrimazole 1 % cream ?Commonly known as: LOTRIMIN ?Apply topically 2 (two) times daily. ?What changed: how much to take ?  ?diclofenac Sodium 1 % Gel ?Commonly known as:  VOLTAREN ?Apply 2 g topically 3 (three) times daily. ?  ?famotidine 20 MG tablet ?Commonly known as: PEPCID ?Take 1 tablet (20 mg total) by mouth daily. ?  ?ferrous sulfate 325 (65 FE) MG tablet ?Take 1 tablet (325 mg total) by mouth daily with breakfast. ?  ?ibuprofen 400 MG tablet ?Commonly known as: ADVIL ?Take 1 tablet (400 mg total) by mouth every 8 (eight) hours as needed. ?  ?liver oil-zinc oxide 40 % ointment ?Commonly known as: DESITIN ?Apply 1 application topically daily. ?  ?lubiprostone 24 MCG capsule ?Commonly known as: AMITIZA ?TAKE 1 CAPSULE (24 MCG TOTAL) BY MOUTH 2 (TWO) TIMES DAILY WITH A MEAL. ?  ?magnesium hydroxide 400 MG/5ML suspension ?Commonly known as: MILK OF MAGNESIA ?Take 30 mLs by mouth daily. ?  ?methadone 10 MG/5ML solution ?Commonly known as: DOLOPHINE ?Take 50 mg by mouth daily. ?  ?multivitamin with minerals Tabs tablet ?Take 1 tablet by mouth daily. ?  ?NASAL SPRAY NA ?Place 1 spray into both nostrils daily as needed (congestion). Family care nasal spray ?  ?nutrition supplement (JUVEN) Pack ?Take 1 packet by mouth 2 (two) times daily between meals. ?  ?feeding supplement Liqd ?Take 237 mLs by mouth 2 (two) times daily between meals. ?  ?OVER THE COUNTER MEDICATION ?Take 3 capsules by mouth every morning. Omega XL ?  ?polyethylene glycol 17 g packet ?Commonly known as: MIRALAX / GLYCOLAX ?Take 17 g by mouth 2 (two) times daily. ?  What changed: when to take this ?  ?saccharomyces boulardii 250 MG capsule ?Commonly known as: FLORASTOR ?Take 250 mg by mouth daily. ?  ?senna-docusate 8.6-50 MG tablet ?Commonly known as: Senokot-S ?Take 2 tablets by mouth 2 (two) times daily. ?  ? ?  ? ?  ?  ? ? ?  ?Durable Medical Equipment  ?(From admission, onward)  ?  ? ? ?  ? ?  Start     Ordered  ? 02/10/22 1508  For home use only DME Other see comment  Once       ?Comments: Clinical research associate  ?Question:  Length of Need  Answer:  12 Months  ? 02/10/22 1507  ? ?  ?  ? ?  ? ? ?  ?Discharge Care  Instructions  ?(From admission, onward)  ?  ? ? ?  ? ?  Start     Ordered  ? 02/10/22 0000  Discharge wound care:       ?Comments: Wound Care: ?  ?Wound care to buttocks, posterior thigh, perineal, scrotal area chronic areas of pressure injury (Stage 4) post skin flap:  Cleanse with soap and water, rinse with normal saline, pat dry. Cover affected areas with silver hydrofiber (Aquacel Ag+, Lawson # [id number]), tuck into crevices using cotton tipped applicator where able. Top dressings with ABD pads and secure with paper tape. Turn patient from side to side. No disposable briefs or under pads. ?  ?Ostomy Care: ?  ?Empty pouch when 1/3  to ? full of stool/flatus ?Clean bottom 2-inches of fecal pouch prior to resealing ?Assist patient in emptying pouch if it cannot be performed independently ?Change pouch twice weekly and as needed.  Write date of pouch application on pouch. ?Apply skin barrier ring to clean skin, place skin barrier on top of skin barrier ring and attach pouch to skin barrier. ?Have spare pouch at bedside at all times ?Order Supplies: 2-piece 2 and 3/4 inch ostomy pouching system with skin barrier ring.  ? 02/10/22 1411  ? 02/09/22 0000  Discharge wound care:       ?Comments: Wound Care: ?  ?Wound care to buttocks, posterior thigh, perineal, scrotal area chronic areas of pressure injury (Stage 4) post skin flap:  Cleanse with soap and water, rinse with normal saline, pat dry. Cover affected areas with silver hydrofiber (Aquacel Ag+, Lawson # [id number]), tuck into crevices using cotton tipped applicator where able. Top dressings with ABD pads and secure with paper tape. Turn patient from side to side. No disposable briefs or under pads. ?  ?Ostomy Care: ?  ?Empty pouch when 1/3  to ? full of stool/flatus ?Clean bottom 2-inches of fecal pouch prior to resealing ?Assist patient in emptying pouch if it cannot be performed independently ?Change pouch twice weekly and as needed.  Write date of pouch application on  pouch. ?Apply skin barrier ring to clean skin, place skin barrier on top of skin barrier ring and attach pouch to skin barrier. ?Have spare pouch at bedside at all times ?Order Supplies: 2-piece 2 and 3/4 inch ostomy pouching system with skin barrier ring.  ? 02/09/22 1402  ? ?  ?  ? ?  ? ? 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  At the hospital follow up visit please address: ? ?Acute on chronic sacral wound infection, Hx chronic osteomyelitis, L1 SCI with paraplegia w/ colostomy and indwelling catheter: Patient was discharged with plan  for his home aide to perform wound care dressings during the week while the patient also attends weekly appointments at the wound care center in [place].  Transportation to these appointments has also been arranged for the patient through his insurance.  ? ?Acute right hip pain ?X-ray showed osteoarthritis. Pt got several doses of toradol on the final days of his discharge. He will take ibuprofen 400 mg q8h for about 2 weeks.  ? ?Acute on chronic normocytic anemia: Hemoglobin on admission of 9.5 with baseline of around 10.  Suspect decrease in hemoglobin secondary to fluid resuscitation during this hospitalization.  He received 1 unit of packed red blood cells on April 20 with improvement of his hemoglobin.  Hemoglobin at discharge of 7.2.  Consider repeat CBC at next appointment with PCP. ? ?Hypospadias: Patient has an appointment scheduled with urology on May 17.  Patient will undergo anterior urethroplasty on June 2.  Please ensure patient has attended his appointments. ? ?Hypotension, history of hypertension: Patient's systolic blood pressures were in the low normal range the majority of the time during this hospitalization.  His home olmesartan-amlodipine-HCTZ was held at discharge.  Consider restarting this medication at follow-up with his primary care provider. ? ?Labs / imaging needed at time of  follow-up: CBC ? ?Pending labs/ test needing follow-up: None ? ?Follow-up Appointments: ? Follow-up Information   ? ? [facility]. Schedule an appointment as soon as possible for a visi

## 2022-02-09 NOTE — Progress Notes (Signed)
? ?Subjective:  ?Overnight Events: No acute events or concerns overnight. ? ?Patient was seen at bedside during rounds this AM.  He endorses significant right hip pain that was responsive to Toradol yesterday. No other complaints or concerns today. ? ?Objective: ? ?Vital signs in last 24 hours: ?Vitals:  ? 02/08/22 2054 02/09/22 0222 02/09/22 0533 02/09/22 0912  ?BP: 114/82 (!) 99/58 (!) 107/49 (!) 108/53  ?Pulse: 88 79 72 64  ?Resp: '18 18 18 16  '$ ?Temp: 99.3 ?F (37.4 ?C) 98.3 ?F (36.8 ?C) 98.1 ?F (36.7 ?C) 98 ?F (36.7 ?C)  ?TempSrc: Oral Oral Oral Oral  ?SpO2: 100% 97% 100% 98%  ?Weight:      ?Height:      ? ? ?Intake/Output Summary (Last 24 hours) at 02/09/2022 1644 ?Last data filed at 02/09/2022 1300 ?Gross per 24 hour  ?Intake 1280 ml  ?Output 1375 ml  ?Net -95 ml  ? ? ?Constitutional: Obese and in no distress.  ?HENT:  ?Head: Normocephalic and atraumatic.  ?Eyes: R prosthetic eye, L eye anicteric  ?Neck: Normal range of motion.  ?Cardiovascular: Normal rate, regular rhythm, intact distal pulses. No gallop and no friction rub.  ?No murmur heard. No lower extremity edema  ?Pulmonary: Non labored breathing on room air, no wheezing or rales  ?Abdominal: Soft. Normal bowel sounds. Non distended and non tender ?Musculoskeletal: Normal range of motion.     ?   General: No tenderness or edema.  ?Neurological: Alert and oriented to person, place, and time. Paraplegic. ?Skin: Skin is warm and dry.  ? ?Assessment/Plan: ? ?Principal Problem: ?  Complicated UTI (urinary tract infection) ? ?#Acute on chronic sacral wound infection ?#Hx chronic osteomyelitis ?#L1 SCI with paraplegia w/ colostomy and indwelling catheter ?Medically stable for discharge. Over the past few days, we have been working extensively with our case management team to find an option for the patient for his wound care. Unfortunately, we are unable to send the patient to SNF as there are no accepting facilities at this time. There are no HHC options that  will accept the patient either. Our only option is a wound care center in [place] that can provide once weekly services, which patient has agreed to. His home aide will be given instructions on how to perform dressing changes between these weekly visits. However, it appears that patient's home aide is unable to provide these services until Friday, so we will need to ensure that the patient is safe for discharge first, likely tomorrow. ?-Palliative care is on board, appreciate their recommendations ?-Completed Levaquin/Course of antibiotics ?-Daily wound care ?-TOC consult, appreciate assistance with placement ?-TOC airflow bed delivered. Ensure Enlive twice daily and MVI ? ?#Acute right hip pain ?On 4/18, patient reported acute right hip pain that began during wound care as patient was logrolled. XR of the hip showed moderate OA. Low suspicion for acute process.  This was previously managed supportively, however patient states that his pain has especially been bothering him today.  Discussed x-ray findings with patient today.  Will give finish 5 doses of IV toradol. ?-Continue IV Toradol 15 mg every 6 hours ?-Avoid movements that exacerbate pain. ? ?#Acute L wrist pain, improving  ?Allopurinol was recently held in the setting of AKI, and the patient subsequently developed significant left wrist pain. He is status post a course of colchicine. Allopurinol was restarted on 04/30. Recent left wrist films show mild OA. Pain likely 2/2 gout flare given positive response to therapies given so far.  ?-Continue  allopurinol ? ?#Chronic constipation, neurogenic bowel ?Has good ostomy output.  ?On miralax BID, senna BID, and milk of magnesia. ? ?#CAUTI, resolved ?Patient has chronic indwelling Foley which was replaced at admission. Source control achieved. Likely has chronic colonization. ? ?#Hypotension, resolved ?#History of HTN ?Hypotension likely 2/2 hypovolemia  and infection. Hypotension improved after fluid  resuscitation. SBPs currently in the 90s-100s. Holding home Olmesartan-amlodipine-HCTZ 40-10-25 mg daily. ? ?#Acute on chronic normocytic anemia ?#History of IDA ?Hemoglobin on admission 9.5 with baseline ~10. No sources of bleeding identified.  Suspect decrease in hemoglobin on 4/20 to ~7.0 secondary to fluid resuscitation. Received 1uRBCs on 4/20 with improvement in Hgb. Stable.  ?-Lab holiday today ?-Transfuse for hemoglobin less than 7 ?-Iron supplementation ? ?#Opioid use disorder ?Patient has history of opiate overdose in 2019 for which he had an ICU admission.  At that time patient was deemed high risk for future opioid misuse.  Patient will need to continue on methadone for chronic opioid use disorder.  He attends [facility] and obtains weekly prescriptions from their providers. ?-Continue on home methadone '50mg'$  daily.   ? ?#OSA ?Continue on CPAP nightly. ? ?#GERD ?Continue on home famotidine '40mg'$  daily. ? ?#Right lower extremity spasticity ?Patient endorses right lower extremity pain with tightness of the muscles and contractures. Suspect spasticity secondary to spinal cord injury.  Patient reports he has tried muscle relaxer such as baclofen in the past to minimal effect. Recommend PT.  ?-PT as above ? ?#Hypospadias ?#Posterior urethral valves ?Patient saw urology February 2022 Dr. Leory Plowman. They had a discussion about urethroplasty. His appointment with urology is scheduled for later this month with plan for surgery on June 2nd. ? ?Diet: Regular  ?VTE: Xarelto at VTE ppx dose ?IVF: None ?Code: Full ? ?Prior to Admission Living Arrangement: Home ?Anticipated Discharge Location: Home ?Barriers to Discharge: Dispo planning ?Dispo: Anticipated discharge in approximately 1 days ? ?Mitzie Na, MD PGY-1 ?Internal Medicine  ?Pager [phone] ? ?

## 2022-02-09 NOTE — Progress Notes (Signed)
RT set up patient  CPAP at beside. Patient has home mask patient able to place on when ready. No assistance needed.  ?

## 2022-02-10 MED ORDER — FLUTICASONE PROPIONATE 50 MCG/ACT NA SUSP
1.0000 | Freq: Every day | NASAL | Status: DC
  Filled 2022-02-10: qty 16

## 2022-02-10 MED ORDER — OXYMETAZOLINE HCL 0.05 % NA SOLN
1.0000 | Freq: Two times a day (BID) | NASAL | Status: DC
  Administered 2022-02-10 – 2022-02-12 (×4): 1 via NASAL
  Filled 2022-02-10: qty 30
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
## 2022-02-10 NOTE — Progress Notes (Signed)
? ?  Palliative Medicine Inpatient Follow Up Note ? ? ?Have reviewed [name]'s chart.  ? ?Messaged the [facility] team regarding arrangement of outpatient Palliative care. They shared this will be arranged at time of discharge. ? ?No charge.  ?______________________________________________________________________________________ ?[name] ?[place] Team ?Team Cell Phone: [phone] ?Please utilize secure chat with additional questions, if there is no response within 30 minutes please call the above phone number ? ?Palliative Medicine Team providers are available by phone from 7am to 7pm daily and can be reached through the team cell phone.  ?Should this patient require assistance outside of these hours, please call the patient's attending physician. ? ? ? ? ?

## 2022-02-10 NOTE — Progress Notes (Signed)
? ?Subjective:  ?Overnight Events: No acute events or concerns overnight. ? ?Patient was seen at bedside during rounds this AM.  He states that his friend who is helping him with his wound care dressings at home is currently on the train and on his way to help take care of the patient. ? ?Objective: ? ?Vital signs in last 24 hours: ?Vitals:  ? 02/09/22 1724 02/09/22 2149 02/10/22 0827 02/10/22 1221  ?BP: (!) 123/55 (!) 113/57 (!) 114/51 (!) 136/111  ?Pulse: 81 80 66 90  ?Resp: '18 18 16 16  '$ ?Temp: 98.3 ?F (36.8 ?C) 98.2 ?F (36.8 ?C) 98 ?F (36.7 ?C) 98.1 ?F (36.7 ?C)  ?TempSrc: Oral Oral Oral Oral  ?SpO2: 99% 99% 100% 99%  ?Weight:      ?Height:      ? ? ?Intake/Output Summary (Last 24 hours) at 02/10/2022 1400 ?Last data filed at 02/10/2022 0900 ?Gross per 24 hour  ?Intake 1030 ml  ?Output 1600 ml  ?Net -570 ml  ? ? ?Constitutional: Obese and in no distress.  ?HENT:  ?Head: Normocephalic and atraumatic.  ?Eyes: R prosthetic eye, L eye anicteric  ?Neck: Normal range of motion.  ?Cardiovascular: Normal rate, regular rhythm, intact distal pulses. No gallop and no friction rub.  ?No murmur heard. No lower extremity edema  ?Pulmonary: Non labored breathing on room air, no wheezing or rales  ?Abdominal: Soft. Normal bowel sounds. Non distended and non tender ?Musculoskeletal: Normal range of motion.     ?   General: No tenderness or edema.  ?Neurological: Alert and oriented to person, place, and time. Paraplegic. ?Skin: Skin is warm and dry.  ? ?Assessment/Plan: ? ?Principal Problem: ?  Complicated UTI (urinary tract infection) ? ?#Acute on chronic sacral wound infection ?#Hx chronic osteomyelitis ?#L1 SCI with paraplegia w/ colostomy and indwelling catheter ?Medically stable for discharge. Over the past few days, we have been working extensively with our case management team to find an option for the patient for his wound care. Unfortunately, we are unable to send the patient to SNF as there are no accepting facilities at  this time. There are no HHC options that will accept the patient either. Our only option is a wound care center in [place] that can provide once weekly services, which patient has agreed to. His home aide will be given instructions on how to perform dressing changes between these weekly visits.  Patient is medically stable for discharge at this time.  We have placed discharge orders today, and patient will be transported home once his friend is able to pick him up from the hospital. ?-Palliative care is on board, appreciate their recommendations ?-Completed Levaquin/Course of antibiotics ?-Daily wound care ?-TOC consult, appreciate assistance with placement ?-TOC airflow bed delivered. Ensure Enlive twice daily and MVI ? ?#Acute right hip pain ?On 4/18, patient reported acute right hip pain that began during wound care as patient was logrolled. XR of the hip showed moderate OA. Low suspicion for acute process.  This was previously managed supportively and with IV Toradol, which helped alleviate his pain. ?-Avoid movements that exacerbate pain. ? ?#Acute L wrist pain, improving  ?Allopurinol was recently held in the setting of AKI, and the patient subsequently developed significant left wrist pain. He is status post a course of colchicine. Allopurinol was restarted on 04/30. Recent left wrist films show mild OA. Pain likely 2/2 gout flare given positive response to therapies given so far.  ?-Continue allopurinol ? ?#Chronic constipation, neurogenic bowel ?Has good  ostomy output.  ?On miralax BID, senna BID, and milk of magnesia. ? ?#CAUTI, resolved ?Patient has chronic indwelling Foley which was replaced at admission. Source control achieved. Likely has chronic colonization. ? ?#Hypotension, resolved ?#History of HTN ?Hypotension likely 2/2 hypovolemia  and infection. Hypotension improved after fluid resuscitation. SBPs currently in the 90s-100s. Holding home Olmesartan-amlodipine-HCTZ 40-10-25 mg daily. ? ?#Acute  on chronic normocytic anemia ?#History of IDA ?Hemoglobin on admission 9.5 with baseline ~10. No sources of bleeding identified.  Suspect decrease in hemoglobin on 4/20 to ~7.0 secondary to fluid resuscitation. Received 1uRBCs on 4/20 with improvement in Hgb. Stable.  ?-Lab holiday today ?-Transfuse for hemoglobin less than 7 ?-Iron supplementation ? ?#Opioid use disorder ?Patient has history of opiate overdose in 2019 for which he had an ICU admission.  At that time patient was deemed high risk for future opioid misuse.  Patient will need to continue on methadone for chronic opioid use disorder.  He attends [facility] and obtains weekly prescriptions from their providers. ?-Continue on home methadone '50mg'$  daily.   ? ?#OSA ?Continue on CPAP nightly. ? ?#GERD ?Continue on home famotidine '40mg'$  daily. ? ?#Right lower extremity spasticity ?Patient endorses right lower extremity pain with tightness of the muscles and contractures. Suspect spasticity secondary to spinal cord injury.  Patient reports he has tried muscle relaxer such as baclofen in the past to minimal effect. Recommend PT.  ?-PT as above ? ?#Hypospadias ?#Posterior urethral valves ?Patient saw urology February 2022 Dr. Leory Plowman. They had a discussion about urethroplasty. His appointment with urology is scheduled for later this month with plan for surgery on June 2nd. ? ?Diet: Regular  ?VTE: Xarelto at VTE ppx dose ?IVF: None ?Code: Full ? ?Prior to Admission Living Arrangement: Home ?Anticipated Discharge Location: Home with friend/aide ?Barriers to Discharge: Dispo planning ?Dispo: Anticipated discharge in approximately 0-1 days ? ?Mitzie Na, MD PGY-1 ?Internal Medicine  ?Pager [phone] ? ?

## 2022-02-10 NOTE — TOC Progression Note (Signed)
Transition of Care (TOC) - Progression Note  ? ? ?Patient Details  ?Name: [name] ?MRN: [id number] ?Date of Birth: 04-12-1966 ? ?Transition of Care (TOC) CM/SW Contact  ?Tom-Johnson, Renea Ee, RN ?Phone Number: ?02/10/2022, 4:53 PM ? ?Clinical Narrative:    ? ?CM notified by patient's nurse that his caregiver will not be available today for hi to be discharged. CM spoke with patient at bedside and he stated his caregiver will be arriving in [place] by 1:30 pm tomorrow, Saturday 02/11/22. MD and [facility] supervisor notified for safe discharge plans.  ?Patient also requested an electric hoyer lift as he has a manual one which does not carry his weight well. Hoyer lift ordered from [employer] and will be delivered to patient's residence.  ?Outpatient palliative referral sent to [facility] per MD's consult. Ebony Hail acknowledged referral and states she will contact patient.  ?CM notified nursing staff to give patient five days wound care supplies until he can go to his wound care appointment. Staff ordered supplies.  ?CM will continue to follow with needs.    ?  ?Barriers to Discharge: No Home Care Agency will accept this patient ? ?Expected Discharge Plan and Services ?  ?  ?Discharge Planning Services: CM Consult ?Post Acute Care Choice: Home Health, Long Term Acute Care (LTAC) ?Living arrangements for the past 2 months: Apartment ?Expected Discharge Date: 02/10/22               ?  ?  ?  ?  ?  ?  ?  ?  ?  ?  ? ? ?Social Determinants of Health (SDOH) Interventions ?  ? ?Readmission Risk Interventions ?   ? View : No data to display.  ?  ?  ?  ? ? ?

## 2022-02-10 NOTE — Consult Note (Signed)
? ?  [facility] CM Inpatient Consult ? ? ?02/10/2022 ? ?Antinio Hyde ?02/10/1966 ?[id number] ? ? ?[facility] [ACO] Patient: [employer] ? ?PCP: No primary care provider on file. ? ? ?Follow up: Did not transition home ? ?Met with the patient at the bedside, patient states, " I have my caregiver on the phone and he can not get back until tomorrow." Patient's caregiver, [name] identified himself  on FaceTime  stating his Transportation back doesn't leave until 1 pm today and not to return to the area until around 1 am. ? ?Continue to listen to patient to ventilate, and he says he is finding it hard to trust the health care providers from all of the things he has been through.  He states, " he needs his equipment at home fixed, he states he has no supplies to have his dressing changed.  States that no one is thinking of his needs,  states, "they just want me out."  ? ?Inpatient TOC RNCM, Marcella, entered the room to notify the patient of the hoyer lift request.  Patient updated her on his caregiver coming back into town that he had to pay for his transportation back. States, "I'm doing my part."  Patient states he is acceptable for follow up with Palliative Care as well.  Inpatient TOC RNCM will offer choice she states and that he will need a new follow up appointment for post hospital follow with [facility] for new patient appointment.  ?Patient continues to talk about past experiences with transitioning home before things were done "correctly" or put in place. States, "they just wanted me to get out of the door and set up empty promises and tell me that they couldn't find the order or things like that."  Encouraged patient to speak with the Office of Patient Experience. Patient states, "I may need to talk to a lawyer."  Follow up with inpatient TOC RNCM. ? ?Plan: To assign patient to [employer] [employer] team at transition for community care  management for barriers and follow up.  ? ?Natividad Brood, RN BSN CCM ?[facility] Liaison ? [phone] business mobile phone ?Toll free office [phone]  ?Fax number: [phone] ?[email or web address] ?[email or web address] ? ? ? ?

## 2022-02-10 NOTE — Progress Notes (Signed)
Pt states that he will place self on CPAP, no assistance needed.  ?

## 2022-02-10 NOTE — Progress Notes (Signed)
Home wound care supplies placed in a patient belonging bad and placed with his things for discharge tomorrow.  All needed supplies in bag for dressing changes. ? ?

## 2022-02-11 LAB — BASIC METABOLIC PANEL
Anion gap: 5 (ref 5–15)
BUN: 41 mg/dL — ABNORMAL HIGH (ref 6–20)
CO2: 30 mmol/L (ref 22–32)
Calcium: 8.8 mg/dL — ABNORMAL LOW (ref 8.9–10.3)
Chloride: 107 mmol/L (ref 98–111)
Creatinine, Ser: 1.09 mg/dL (ref 0.61–1.24)
GFR, Estimated: >60 mL/min (ref >60)
Glucose, Bld: 109 mg/dL — ABNORMAL HIGH (ref 70–99)
Potassium: 4.9 mmol/L (ref 3.5–5.1)
Sodium: 142 mmol/L (ref 135–145)

## 2022-02-11 MED ORDER — KETOROLAC TROMETHAMINE 15 MG/ML IJ SOLN
15.0000 mg | Freq: Four times a day (QID) | INTRAMUSCULAR | Status: AC
  Administered 2022-02-11 – 2022-02-12 (×5): 15 mg via INTRAVENOUS
  Filled 2022-02-11 (×5): qty 1

## 2022-02-11 NOTE — Progress Notes (Signed)
Offered to perform dressing change and cream application. Patient stated he was not ready for that yet. Attempts dressing change. Patient refused.  ?

## 2022-02-11 NOTE — Plan of Care (Signed)
?  Problem: Clinical Measurements: ?Goal: Diagnostic test results will improve ?Outcome: Progressing ?  ?Problem: Elimination: ?Goal: Will not experience complications related to bowel motility ?Outcome: Progressing ?  ?Problem: Elimination: ?Goal: Will not experience complications related to urinary retention ?Outcome: Progressing ?  ?

## 2022-02-11 NOTE — Progress Notes (Signed)
Per patient brother who is care giver stuck in [place]  riding greyhound from [place].  Expecting to leave [place] 11am,  LET provider and [facility] team know ?

## 2022-02-11 NOTE — Progress Notes (Signed)
? ?Subjective:  ?Overnight Events: No acute events or concerns overnight. ? ?Patient was seen at bedside during rounds this AM.  He continues to complain of right hip pain and is asking for some more Toradol.  Otherwise, states that his friend is driving in from [place] and will be here later this afternoon to take him home.  No other complaints or concerns. ? ?Objective: ? ?Vital signs in last 24 hours: ?Vitals:  ? 02/10/22 1221 02/10/22 1612 02/10/22 2155 02/11/22 0850  ?BP: (!) 136/111 127/62 114/63 (!) 104/49  ?Pulse: 90 88 82 71  ?Resp: '16 16 18 16  '$ ?Temp: 98.1 ?F (36.7 ?C) 98.3 ?F (36.8 ?C) 98.2 ?F (36.8 ?C) 98.3 ?F (36.8 ?C)  ?TempSrc: Oral Axillary  Oral  ?SpO2: 99% 99% 98% 99%  ?Weight:      ?Height:      ? ? ?Intake/Output Summary (Last 24 hours) at 02/11/2022 0959 ?Last data filed at 02/11/2022 0900 ?Gross per 24 hour  ?Intake 940 ml  ?Output 3925 ml  ?Net -2985 ml  ? ? ?Constitutional: Obese and in no distress.  ?HENT:  ?Head: Normocephalic and atraumatic.  ?Eyes: R prosthetic eye, L eye anicteric  ?Neck: Normal range of motion.  ?Cardiovascular: Normal rate, regular rhythm, intact distal pulses. No gallop and no friction rub.  ?No murmur heard. No lower extremity edema  ?Pulmonary: Non labored breathing on room air, no wheezing or rales  ?Abdominal: Soft. Normal bowel sounds. Non distended and non tender ?Musculoskeletal: Normal range of motion.     ?   General: No tenderness or edema.  ?Neurological: Alert and oriented to person, place, and time. Paraplegic. ?Skin: Skin is warm and dry.  ? ?Assessment/Plan: ? ?Principal Problem: ?  Complicated UTI (urinary tract infection) ? ?#Acute on chronic sacral wound infection ?#Hx chronic osteomyelitis ?#L1 SCI with paraplegia w/ colostomy and indwelling catheter ?Medically stable for discharge. Over the past few days, we have been working extensively with our case management team to find an option for the patient for his wound care. Unfortunately, we are  unable to send the patient to SNF as there are no accepting facilities at this time. There are no HHC options that will accept the patient either. Our only option is a wound care center in [place] that can provide once weekly services, which patient has agreed to. His home aide will be given instructions on how to perform dressing changes between these weekly visits.  Patient is medically stable for discharge at this time. Discharge orders are in, discharge pending transportation (friend will get here this afternoon). ?-Palliative care is on board, appreciate their recommendations ?-Completed Levaquin/Course of antibiotics ?-Daily wound care ?-TOC consult, appreciate assistance with placement ?-TOC airflow bed delivered. Ensure Enlive twice daily and MVI ? ?#Acute right hip pain ?On 4/18, patient reported acute right hip pain that began during wound care as patient was logrolled. XR of the hip showed moderate OA. Low suspicion for acute process.  Today, endorses worsening right hip pain.  This was previously managed supportively and with IV Toradol, which helped alleviate his pain. ?-Start IV Toradol 15 mg every 6 hours for 5 doses ?-Avoid movements that exacerbate pain. ? ?#Acute L wrist pain, improving  ?Allopurinol was recently held in the setting of AKI, and the patient subsequently developed significant left wrist pain. He is status post a course of colchicine. Allopurinol was restarted on 04/30. Recent left wrist films show mild OA. Pain likely 2/2 gout flare given positive  response to therapies given so far.  ?-Continue allopurinol ? ?#Chronic constipation, neurogenic bowel ?Has good ostomy output.  ?On miralax BID, senna BID, and milk of magnesia. ? ?#CAUTI, resolved ?Patient has chronic indwelling Foley which was replaced at admission. Source control achieved. Likely has chronic colonization. ? ?#Hypotension, resolved ?#History of HTN ?Hypotension likely 2/2 hypovolemia  and infection. Hypotension  improved after fluid resuscitation. SBPs currently in the 90s-100s. Holding home Olmesartan-amlodipine-HCTZ 40-10-25 mg daily. ? ?#Acute on chronic normocytic anemia ?#History of IDA ?Hemoglobin on admission 9.5 with baseline ~10. No sources of bleeding identified.  Suspect decrease in hemoglobin on 4/20 to ~7.0 secondary to fluid resuscitation. Received 1uRBCs on 4/20 with improvement in Hgb. Stable.  ?-Lab holiday today ?-Transfuse for hemoglobin less than 7 ?-Iron supplementation ? ?#Opioid use disorder ?Patient has history of opiate overdose in 2019 for which he had an ICU admission.  At that time patient was deemed high risk for future opioid misuse.  Patient will need to continue on methadone for chronic opioid use disorder.  He attends [facility] and obtains weekly prescriptions from their providers. ?-Continue on home methadone '50mg'$  daily.   ? ?#OSA ?Continue on CPAP nightly. ? ?#GERD ?Continue on home famotidine '40mg'$  daily. ? ?#Right lower extremity spasticity ?Patient endorses right lower extremity pain with tightness of the muscles and contractures. Suspect spasticity secondary to spinal cord injury.  Patient reports he has tried muscle relaxer such as baclofen in the past to minimal effect. Recommend PT.  ?-PT as above ? ?#Hypospadias ?#Posterior urethral valves ?Patient saw urology February 2022 Dr. Leory Plowman. They had a discussion about urethroplasty. His appointment with urology is scheduled for later this month with plan for surgery on June 2nd. ? ?Diet: Regular  ?VTE: Xarelto at VTE ppx dose ?IVF: None ?Code: Full ? ?Prior to Admission Living Arrangement: Home ?Anticipated Discharge Location: Home with friend/aide ?Barriers to Discharge: Transportation home ?Dispo: Anticipated discharge today ? ?Mitzie Na, MD PGY-1 ?Internal Medicine  ?Pager [phone] ? ?

## 2022-02-11 NOTE — TOC Progression Note (Signed)
Transition of Care (TOC) - Progression Note  ? ? ?Patient Details  ?Name: [name] ?MRN: [id number] ?Date of Birth: [birth year] ? ?Transition of Care (TOC) CM/SW Contact  ?Bartholomew Crews, RN ?Phone Number: [phone] ?02/11/2022, 5:09 PM ? ?Clinical Narrative:    ? ?Spoke with patient on hospital room phone. Cell phone and home address confirmed. Patient's caregiver is still in DC at [place]. When caregiver arrives at patient's home, [name] can be called at [phone], option 1, then option 3. Medical transport paperwork completed for p/up on 5/14 and sent to printer at nurses station. Patient stated that he will let nursing know when caregiver has arrived.  ? ?  ?Barriers to Discharge: No Home Care Agency will accept this patient ? ?Expected Discharge Plan and Services ?  ?  ?Discharge Planning Services: CM Consult ?Post Acute Care Choice: Home Health, Long Term Acute Care (LTAC) ?Living arrangements for the past 2 months: Apartment ?Expected Discharge Date: 02/10/22               ?  ?  ?  ?  ?  ?  ?  ?  ?  ?  ? ? ?Social Determinants of Health (SDOH) Interventions ?  ? ?Readmission Risk Interventions ?   ? View : No data to display.  ?  ?  ?  ? ? ?

## 2022-02-12 NOTE — Progress Notes (Addendum)
DISCHARGE NOTE HOME ?[name] to be discharged Home ? per MD order. Discussed prescriptions and follow up appointments with the patient. Prescriptions given to patient; medication list explained in detail. Patient verbalized understanding. ? ?Skin clean, dry and intact without evidence of skin break down, no evidence of skin tears noted. IV catheter discontinued intact. Site without signs and symptoms of complications. Dressing and pressure applied. Pt denies pain at the site currently. No complaints noted. ? ?No new lines, drains, and wounds. Foley in place and preadmission wounds. ? ?An After Visit Summary (AVS) was printed and given to the patient. ?Patient escorted via wheelchair, and discharged home via private auto. ? ?Berneta Levins, RN  ?

## 2022-02-13 ENCOUNTER — Other Ambulatory Visit: Payer: Self-pay | Admitting: Gastroenterology

## 2022-02-13 DIAGNOSIS — Z7689 Persons encountering health services in other specified circumstances: Secondary | ICD-10-CM | POA: Diagnosis not present

## 2022-02-13 NOTE — TOC Transition Note (Signed)
Transition of Care (TOC) - CM/SW Discharge Note ? ? ?Patient Details  ?Name: [name] ?MRN: [id number] ?Date of Birth: Nov 03, 1965 ? ?Transition of Care (TOC) CM/SW Contact:  ?Tom-Johnson, Renea Ee, RN ?Phone Number: ?02/13/2022, 4:50 PM ? ? ?Clinical Narrative:    ? ?CM received a call from patient today about scheduled transportation for his wound care appointment tomorrow.05/16 at [place]. CM called Tamiko with Medicaid Transportation [phone]) to confirm schedule and Tamiko states that they do not have him scheduled for tomorrow as patient had initially declined. CM notified Tamiko that the transportation form was signed because patient agreed afterwards. CM called Wound clinic in [place] to reschedule appointment and spoke with Jeri Cos, PA who states that [name] spoke with Dr. Eulas Post and made the schedule but was not ware that patient was discharged from their clinic due to non compliance. Jeri Cos, [place] states that he has been seeing patient for a long time and has a record of non compliance and will not be seeing him anymore as he runs both [place] and [employer]. Advised to call [place] or [facility].  ?CM called and scheduled wound care appointment at the [facility] in [facility] [phone]) for Tuesday 02/21/22 at 10:15 am. CM called patient and notify him verbally with understanding and acceptance voiced.  ?Medicaid transportation scheduled for 02/13/22 at 10:15 am. ?No Further TOC needs noted.  ?  ?Barriers to Discharge: No Home Care Agency will accept this patient ? ? ?Patient Goals and CMS Choice ?Patient states their goals for this hospitalization and ongoing recovery are:: To get better and return home ?CMS Medicare.gov Compare Post Acute Care list provided to:: Patient ?Choice offered to / list presented to : Patient ? ?Discharge Placement ?  ?           ?  ?  ?  ?  ? ?Discharge Plan and Services ?  ?Discharge Planning Services: CM Consult ?Post  Acute Care Choice: Home Health, Long Term Acute Care (LTAC)          ?  ?  ?  ?  ?  ?  ?  ?  ?  ?  ? ?Social Determinants of Health (SDOH) Interventions ?  ? ? ?Readmission Risk Interventions ?   ? View : No data to display.  ?  ?  ?  ? ? ? ? ? ?

## 2022-02-13 NOTE — Patient Outreach (Signed)
[facility]) Care Management ? ?02/13/2022 ? ?Constant [place] ?[birth year] ?[phone] ? ? ?Received hospital referral from Natividad Brood, RN for [place] Coordinator for complex care and disease management follow up calls and assess for further needs. Assigned to [name], RN care coordinator for follow up. ? ?Philmore Pali ?[place] Management Assistant ?[phone] ?  ?

## 2022-02-14 ENCOUNTER — Ambulatory Visit: Payer: Medicare Other | Admitting: Physician Assistant

## 2022-02-14 DIAGNOSIS — Z7689 Persons encountering health services in other specified circumstances: Secondary | ICD-10-CM | POA: Diagnosis not present

## 2022-02-15 DIAGNOSIS — Z7689 Persons encountering health services in other specified circumstances: Secondary | ICD-10-CM | POA: Diagnosis not present

## 2022-02-16 DIAGNOSIS — Z7689 Persons encountering health services in other specified circumstances: Secondary | ICD-10-CM | POA: Diagnosis not present

## 2022-02-17 ENCOUNTER — Encounter: Payer: Self-pay | Admitting: *Deleted

## 2022-02-17 ENCOUNTER — Other Ambulatory Visit: Payer: Self-pay | Admitting: *Deleted

## 2022-02-17 DIAGNOSIS — Z7689 Persons encountering health services in other specified circumstances: Secondary | ICD-10-CM | POA: Diagnosis not present

## 2022-02-17 NOTE — Patient Outreach (Signed)
[facility]) Care Management Telephonic RN Care Manager Note   02/17/2022 Name:  [name] MRN:  [id number] DOB:  December 25, 1965  Summary: RN CM collaborated with [facility] hospital liaison as Liaison received a message from this patient during her time away from the office  Successful outreach to [name] confirms his home aide [name] is present Hip pain continues since discharge [name] reports [name] was informed it was "bone on bone" but reports [name] received various providers offers various explanations   Has an appointment for his methadone clinic for 02/20/22 0700 [name] reports [name] will be cancelling his 03/03/22 urology surgery [facility] but [name] has not been to the MD yet   EPIC indicates [facility] was for Feb 08 2022 at [place] [phone] But pt was hospitalized so it was cancelled but not rescheduled.  And [name] was to see [name] on 02/14/22 [address] [phone] but this was cancelled and [name] will see new wound doctor in [place] on 02/21/22 with medicaid transportation scheduled  Confined to a powered wheelchair [facility] is reported by the patient to be his home health agency during the transition of care assessment when [name] reported no services at this time plus RN CM updated on the notes of the inpatient RN CM indicating no accepting home health agency prior to discharge  His transportation benefit is called safe ride via united healthcare [employer] healthcare staff confirmed transportation benefit 48 total annual rides -today has used 4 of 48 rides and has 44 rides left   Recommendations/Changes made from today's visit: Post hospital follow up  Assessed for pain  Assessed for care coordination needs - need medical transportation  Outreach with patient to [place] for attempt to get assist for transportation for 02/20/22 methadone  appointment Spoke with April at [phone] medicaid transportation Confirmed [name] has a transportation booking for 02/21/22 but is not able to get assist for 02/20/22 related to needing to call in 3 days prior to appointment April shared with pt that his local DSS medicaid case worker is [name]  [phone] RN CM reviewed with him the numbers for his DSS case worker, DSS transportation and united healthcare transportation plus waited for him to write the information down. RN CM also encouraged him to share this with [name]  Outreach with patient to  [facility]) [phone] spoke with [name] to assist with transportation for 02/20/22 0700 and 02/21/22 0600 to methadone clinic [facility] [address] [place], [place] [address]  [phone]  02/20/22 Confirm #[id number] pick up 0618, pt to call #[phone] 02/21/22 pick up 0511 confirm #[id number], 0630 #[id number]  RN CM outreached to [place] [phone] after discussing with patient about [name] missed 11/11/21 appointment during his hospitalization. [name] gave permission for RN CM to outreach to see if another visit could be scheduled  [name] was offered the earliest appointment for hospital follow up as Friday June 9 at 0930 with Juluis Mire   Unsuccessful attempts x 2 to [facility] [phone] [address] [place] [address]   Subjective: [name] is an 56 y.o. year old male who is a primary patient of No primary care provider on file.. The care management team  was consulted for assistance with care management and/or care coordination needs.    Telephonic RN Care Manager completed Telephone Visit today.   Objective:  Medications Reviewed Today     Reviewed by Lannette Donath, CPhT (Pharmacy Technician) on 01/16/22 at [place]  Med List Status: Complete   Medication Order Taking? Sig Documenting Provider Last Dose Status Informant  allopurinol (ZYLOPRIM)  100 MG tablet [id number] Yes Take 1 tablet (100 mg total) by mouth daily. [name], MD 01/14/2022 Active Self  Ascorbic Acid (VITAMIN C PO) [id number] Yes Take 1 tablet by mouth every morning. [provider] 01/15/2022 Active Self  aspirin EC 81 MG tablet [id number] Yes Take 81 mg by mouth every morning. Swallow whole. [provider] 01/15/2022 Active Self  benzocaine (ANBESOL) 10 % mucosal gel [id number] Yes Use as directed 1 application. in the mouth or throat daily as needed for mouth pain. [provider] 01/14/2022 Active Self  Blood Pressure Monitor DEVI [id number]  Please provide patient with insurance approved blood pressure monitor. ICD10 I.10 Gildardo Pounds, NP  Active Self  Calcium Carbonate-Vitamin D (CALCIUM-D PO) [id number] Yes Take 1 tablet by mouth every morning. [provider] 01/15/2022 Active Self  clotrimazole (LOTRIMIN) 1 % cream [id number] Yes Apply topically 2 (two) times daily.  Patient taking differently: Apply 1 application. topically 2 (two) times daily.   Mitzi Hansen, MD 01/14/2022 Active Self  diclofenac Sodium (VOLTAREN) 1 % GEL [id number] Yes Apply 2 g topically 3 (three) times daily. [provider] 01/15/2022 Active Self  famotidine (PEPCID) 20 MG tablet [id number] Yes Take 1 tablet (20 mg total) by mouth daily. [name], MD 01/15/2022 Active Self  fluconazole (DIFLUCAN) 150 MG tablet [id number] Yes Take 150 mg by mouth once a week. [provider] 01/09/2022 Active Self  liver oil-zinc oxide (DESITIN) 40 % ointment [id number] Yes Apply 1 application topically daily. [provider] 01/15/2022 Active Self  lubiprostone (AMITIZA) 24 MCG capsule [id number] Yes TAKE 1 CAPSULE (24 MCG TOTAL) BY MOUTH 2 (TWO) TIMES DAILY WITH A MEAL. Loralie Champagne, PA-C 01/15/2022 Active Self           Med Note (COFFELL, ANGELA M   Tue Dec 20, 2021 10:42 AM)    methadone (DOLOPHINE) 10 MG/5ML solution [id number] Yes Take  50 mg by mouth daily. [provider] 01/15/2022 Active Multiple Informants           Med Note [place] Crane, MISTY D   Mon Jan 16, 2022  5:43 AM) Per Verdie Drown at [facility] pt last doses at 50 mg qd on 01/10/22 with 1 week of take home doses  Olmesartan-amLODIPine-HCTZ 40-10-25 MG TABS [id number] Yes Take 1 tablet by mouth daily. [name], MD 01/15/2022 Active Self  OVER THE COUNTER MEDICATION [id number] Yes Take 3 capsules by mouth every morning. Omega XL [provider] 01/15/2022 Active Self  Oxymetazoline HCl (NASAL SPRAY NA) [id number] Yes Place 1 spray into both nostrils daily as needed (congestion). Family care nasal spray [provider] 01/13/2022 Active Self  polyethylene glycol (MIRALAX / GLYCOLAX) 17 g packet [id number] Yes Take 17 g by mouth every morning. [provider] 01/14/2022 Active Self  saccharomyces boulardii (FLORASTOR) 250 MG capsule [id number] Yes Take 250 mg by mouth daily. [provider] 01/14/2022 Active Self  Med List Note Acie Fredrickson, Dionne Bucy, CPhT 12/20/21 1200): [place] Open 5:30 AM-2 PM Monday-Friday, 5:30 AM- 7:30 AM Saturday, closed on Sunday  SDOH:  (Social Determinants of Health) assessments and interventions performed:    Care Plan  Review of patient past medical history, allergies, medications, health status, including review of consultants reports, laboratory and other test data, was performed as part of comprehensive evaluation for care management services.   There are no care plans that you recently modified to display for this patient.    Plan: The patient has been provided with contact information for the care management team and has been advised to call with any health related questions or concerns.  The care management team will reach out to the patient again over the next 30+ business days.  Dennice Tindol [name], RN, BSN, [place] Coordinator Office number [phone] Main [facility] number [phone] Fax number [phone]

## 2022-02-18 DIAGNOSIS — Z7689 Persons encountering health services in other specified circumstances: Secondary | ICD-10-CM | POA: Diagnosis not present

## 2022-02-20 ENCOUNTER — Other Ambulatory Visit: Payer: Self-pay | Admitting: *Deleted

## 2022-02-20 DIAGNOSIS — Z7689 Persons encountering health services in other specified circumstances: Secondary | ICD-10-CM | POA: Diagnosis not present

## 2022-02-20 NOTE — Patient Outreach (Signed)
[facility]) Care Management Telephonic RN Care Manager Note   02/20/2022 Name:  [name] MRN:  [id number] DOB:  [birth year]  Summary: Pt left a voice message and RN CM returned a call He is at a local bus depot He updated RN CM that he had to take a regular bus today vs [facility] transportation to the methadone clinic as a regular SUV not a handicap accessible vehicle arrived for his transport [employer] was not called by the patient and Mr Czaja and a male representative discussed the errors ad changes needed. This effects the transportation for 02/21/22. He will have to use the bus on 02/21/22 also  Reports he has my chart but does not know how to use it well nor recall his username  He voices he is aware (after RN CM discuss again) the inpatient discharging staff  discussed the Barrier to Discharge: No [facility] will accept this patient on 02/10/22 His home care agency is unable to allow his aide to do dressing changes and he reports this had been stated to the inpatient [facility] planners  He does confirm his dressing was changed today  He ventilated his feelings about his recent hospitalization care related to the questionable diagnosis of the pain of his hip and his discharge home care plans  Recommendations/Changes made from today's visit: Returned pt outreach and Allowed him to ventilate his feelings Encouragement provided Updated him on his upcoming 03/10/22 new pcp appointment with Juluis Mire 03/10/22 0930 that RN CM assisted with scheduling on 02/17/22 Discussed with him this RN CM attempt to reach staff at [facility] on 02/17/22 without success Provided him with his my chart user name and discussed providing education on its use in future   Subjective: [name] is an 56 y.o. year old male who is a primary patient of [name], NP. The care management team was consulted for assistance with care management and/or care coordination needs.     Telephonic RN Care Manager completed Telephone Visit today.   Objective:  Medications Reviewed Today     Reviewed by Lannette Donath, CPhT (Pharmacy Technician) on 01/16/22 at [place]  Med List Status: Complete   Medication Order Taking? Sig Documenting Provider Last Dose Status Informant  allopurinol (ZYLOPRIM) 100 MG tablet [id number] Yes Take 1 tablet (100 mg total) by mouth daily. [name], MD 01/14/2022 Active Self  Ascorbic Acid (VITAMIN C PO) [id number] Yes Take 1 tablet by mouth every morning. [provider] 01/15/2022 Active Self  aspirin EC 81 MG tablet [id number] Yes Take 81 mg by mouth every morning. Swallow whole. [provider] 01/15/2022 Active Self  benzocaine (ANBESOL) 10 % mucosal gel [id number] Yes Use as directed 1 application. in the mouth or throat daily as needed for mouth pain. [provider] 01/14/2022 Active Self  Blood Pressure Monitor DEVI [id number]  Please provide patient with insurance approved blood pressure monitor. ICD10 I.10 Gildardo Pounds, NP  Active Self  Calcium Carbonate-Vitamin D (CALCIUM-D PO) [id number] Yes Take 1 tablet by mouth every morning. [provider] 01/15/2022 Active Self  clotrimazole (LOTRIMIN) 1 % cream [id number] Yes Apply topically 2 (two) times daily.  Patient taking differently: Apply 1 application. topically 2 (two) times daily.   Mitzi Hansen, MD 01/14/2022 Active Self  diclofenac Sodium (VOLTAREN) 1 % GEL [id number] Yes Apply 2 g topically 3 (three) times daily. [provider] 01/15/2022 Active Self  famotidine (PEPCID) 20 MG tablet [id number] Yes Take 1 tablet (20  mg total) by mouth daily. [name], MD 01/15/2022 Active Self  fluconazole (DIFLUCAN) 150 MG tablet [id number] Yes Take 150 mg by mouth once a week. [provider] 01/09/2022 Active Self  liver oil-zinc oxide (DESITIN) 40 % ointment [id number] Yes Apply 1 application topically daily. [provider]  01/15/2022 Active Self  lubiprostone (AMITIZA) 24 MCG capsule [id number] Yes TAKE 1 CAPSULE (24 MCG TOTAL) BY MOUTH 2 (TWO) TIMES DAILY WITH A MEAL. Loralie Champagne, PA-C 01/15/2022 Active Self           Med Note (COFFELL, ANGELA M   Tue Dec 20, 2021 10:42 AM)    methadone (DOLOPHINE) 10 MG/5ML solution [id number] Yes Take 50 mg by mouth daily. [provider] 01/15/2022 Active Multiple Informants           Med Note [place] Crane, MISTY D   Mon Jan 16, 2022  5:43 AM) Per Verdie Drown at [facility] pt last doses at 50 mg qd on 01/10/22 with 1 week of take home doses  Olmesartan-amLODIPine-HCTZ 40-10-25 MG TABS [id number] Yes Take 1 tablet by mouth daily. [name], MD 01/15/2022 Active Self  OVER THE COUNTER MEDICATION [id number] Yes Take 3 capsules by mouth every morning. Omega XL [provider] 01/15/2022 Active Self  Oxymetazoline HCl (NASAL SPRAY NA) [id number] Yes Place 1 spray into both nostrils daily as needed (congestion). Family care nasal spray [provider] 01/13/2022 Active Self  polyethylene glycol (MIRALAX / GLYCOLAX) 17 g packet [id number] Yes Take 17 g by mouth every morning. [provider] 01/14/2022 Active Self  saccharomyces boulardii (FLORASTOR) 250 MG capsule [id number] Yes Take 250 mg by mouth daily. [provider] 01/14/2022 Active Self  Med List Note Acie Fredrickson, Dionne Bucy, CPhT 12/20/21 1200): [place] [phone] Open 5:30 AM-2 PM Monday-Friday, 5:30 AM- 7:30 AM Saturday, closed on Sunday             SDOH:  (Social Determinants of Health) assessments and interventions performed:    Care Plan  Review of patient past medical history, allergies, medications, health status, including review of consultants reports, laboratory and other test data, was performed as part of comprehensive evaluation for care management services.   There are no care plans that you recently modified to display for this patient.    Plan:  The patient has been provided with contact information for the care management team and has been advised to call with any health related questions or concerns.  The care management team will reach out to the patient again over the next 7+ business days.  Hailee Hollick [name], RN, BSN, [place] Coordinator Office number [phone] Main [facility] number [phone] Fax number [phone]

## 2022-02-22 ENCOUNTER — Other Ambulatory Visit: Payer: Self-pay | Admitting: *Deleted

## 2022-02-22 DIAGNOSIS — Z7689 Persons encountering health services in other specified circumstances: Secondary | ICD-10-CM | POA: Diagnosis not present

## 2022-02-22 NOTE — Patient Outreach (Addendum)
[facility]) Care Management  [place]   02/22/2022 Name:  [name] MRN:  [id number] DOB:  07-24-1966  Summary: [facility] Unsuccessful outreach x 3 before the pt was answered  Outreach attempt to the listed at the preferred outreach number in EPIC [phone]   No answer. [facility] RN CM left HIPAA [facility] Portability and Accountability Act) compliant voicemail message along with CM's contact info.  He reports he has been home all day but has been asleep He is noted with a deeper vocal groggy tone today  He immediately began to discussed that his personal care aide is not being allowed to do his dressing change per the agency. He states the agency staff informed the inpatient staff prior to discharge of this. He also voices being aware that the inpatient staff informed him of the Barriers to Discharge: No [facility] will accept this patient Siblings not local  Pt confirms the Dressing was changed last on Tuesday 02/22/22 He is unable to provide a reason for the Barriers to Discharge: No [facility] will accept this patient He is unable to inform RN CM of home health agencies that provided services before when she named 7 No preference for any home health agencies  Recommendations/Changes made from today's visit: Completed follow up outreach  Allow him to ventilate again  Assessed possible other home health agencies he has had services with before  Attempt to call [phone] [facility] Dr Francesca Jewett Urology to cancel the anterior urethroplasty, possible buccal mucosa graft Provided the patient the number to attempt to call Dr Francesca Jewett on 02/23/22 to cancel this appointment Review of EPIC to note on 4/20-24/23 In patient RN CM called these agencies who declined the patient for services [facility], [place] due to patient was active with their services before but was non compliant with treatment, disrespectful to staff and  chronic wound will not be covered by insurance. [place] patient due to staffing. [facility]- Declined due to being at capacity for [facility] referrals at this time. [facility]- No RN available for wound care in patient's area of residence. [facility]- No response [facility]- Not able to accept at this time  Subjective: [name] is an 56 y.o. year old male who is a primary patient of [name], NP. The care management team was consulted for assistance with care management and/or care coordination needs.    Telephonic RN Care Manager completed Telephone Visit today.   Objective:  Medications Reviewed Today     Reviewed by Lannette Donath, CPhT (Pharmacy Technician) on 01/16/22 at [place]  Med List Status: Complete   Medication Order Taking? Sig Documenting Provider Last Dose Status Informant  allopurinol (ZYLOPRIM) 100 MG tablet [id number] Yes Take 1 tablet (100 mg total) by mouth daily. [name], MD 01/14/2022 Active Self  Ascorbic Acid (VITAMIN C PO) [id number] Yes Take 1 tablet by mouth every morning. [provider] 01/15/2022 Active Self  aspirin EC 81 MG tablet [id number] Yes Take 81 mg by mouth every morning. Swallow whole. [provider] 01/15/2022 Active Self  benzocaine (ANBESOL) 10 % mucosal gel [id number] Yes Use as directed 1 application. in the mouth or throat daily as needed for mouth pain. [provider] 01/14/2022 Active Self  Blood Pressure Monitor DEVI [id number]  Please provide patient with insurance approved blood pressure monitor. ICD10 I.10 Gildardo Pounds, NP  Active Self  Calcium Carbonate-Vitamin D (CALCIUM-D PO) [id number] Yes Take 1 tablet  by mouth every morning. [provider] 01/15/2022 Active Self  clotrimazole (LOTRIMIN) 1 % cream [id number] Yes Apply topically 2 (two) times daily.  Patient taking differently: Apply 1 application. topically 2 (two) times daily.   Mitzi Hansen, MD 01/14/2022 Active Self   diclofenac Sodium (VOLTAREN) 1 % GEL [id number] Yes Apply 2 g topically 3 (three) times daily. [provider] 01/15/2022 Active Self  famotidine (PEPCID) 20 MG tablet [id number] Yes Take 1 tablet (20 mg total) by mouth daily. [name], MD 01/15/2022 Active Self  fluconazole (DIFLUCAN) 150 MG tablet [id number] Yes Take 150 mg by mouth once a week. [provider] 01/09/2022 Active Self  liver oil-zinc oxide (DESITIN) 40 % ointment [id number] Yes Apply 1 application topically daily. [provider] 01/15/2022 Active Self  lubiprostone (AMITIZA) 24 MCG capsule [id number] Yes TAKE 1 CAPSULE (24 MCG TOTAL) BY MOUTH 2 (TWO) TIMES DAILY WITH A MEAL. Loralie Champagne, PA-C 01/15/2022 Active Self           Med Note (COFFELL, ANGELA M   Tue Dec 20, 2021 10:42 AM)    methadone (DOLOPHINE) 10 MG/5ML solution [id number] Yes Take 50 mg by mouth daily. [provider] 01/15/2022 Active Multiple Informants           Med Note [place] Crane, MISTY D   Mon Jan 16, 2022  5:43 AM) Per Verdie Drown at [facility] pt last doses at 50 mg qd on 01/10/22 with 1 week of take home doses  Olmesartan-amLODIPine-HCTZ 40-10-25 MG TABS [id number] Yes Take 1 tablet by mouth daily. [name], MD 01/15/2022 Active Self  OVER THE COUNTER MEDICATION [id number] Yes Take 3 capsules by mouth every morning. Omega XL [provider] 01/15/2022 Active Self  Oxymetazoline HCl (NASAL SPRAY NA) [id number] Yes Place 1 spray into both nostrils daily as needed (congestion). Family care nasal spray [provider] 01/13/2022 Active Self  polyethylene glycol (MIRALAX / GLYCOLAX) 17 g packet [id number] Yes Take 17 g by mouth every morning. [provider] 01/14/2022 Active Self  saccharomyces boulardii (FLORASTOR) 250 MG capsule [id number] Yes Take 250 mg by mouth daily. [provider] 01/14/2022 Active Self  Med List Note Acie Fredrickson, Dionne Bucy, CPhT 12/20/21 1200): [place]  [phone] Open 5:30 AM-2 PM Monday-Friday, 5:30 AM- 7:30 AM Saturday, closed on Sunday             SDOH:  (Social Determinants of Health) assessments and interventions performed:    Care Plan  Review of patient past medical history, allergies, medications, health status, including review of consultants reports, laboratory and other test data, was performed as part of comprehensive evaluation for care management services.   There are no care plans that you recently modified to display for this patient.    Plan: The patient has been provided with contact information for the care management team and has been advised to call with any health related questions or concerns.  The care management team will reach out to the patient again over the next 14+ business  days.  Miraj Truss [name], RN, BSN, [place] Coordinator Office number [phone] Main [facility] number [phone] Fax number [phone]

## 2022-02-23 DIAGNOSIS — Z7689 Persons encountering health services in other specified circumstances: Secondary | ICD-10-CM | POA: Diagnosis not present

## 2022-02-24 ENCOUNTER — Other Ambulatory Visit: Payer: Self-pay | Admitting: *Deleted

## 2022-02-24 DIAGNOSIS — Z7689 Persons encountering health services in other specified circumstances: Secondary | ICD-10-CM | POA: Diagnosis not present

## 2022-02-24 NOTE — Patient Outreach (Signed)
[facility]) Care Management  02/24/2022  [name] 07-Jul-1966 [id number]   [place] coordination- home health/wound care  RN CM outreached to the following home health agencies that referrals were sent to prior to patient 02/12/22 discharge to check on status update of referrals  [name] [phone] left voice message- previous staffing issues- [name] return a call and confirms no available staff to do extensive wound care  [facility]- [phone] [name] confirms they are still at capacity for [facility] patients  Shelia at [facility] wound care center confirmed the dr at the wound care cancelled the patient's Feb 14 2022 wound care visit Consulted [facility] leadership  Attempted to reach staff at [facility] point wound care clinic [phone] to discuss recent visit & frequency, type of dressing changes for pt sacral wound Left a message requesting a return call to RN Cm   RN CM checked pt's list of appointments in [facility] and it does appear his June 2023 urology surgery has been cancelled   Attempted again to reach staff at [facility] point wound care clinic [phone] to discuss recent visit & frequency, type of dressing changes for pt sacral wound Left a message requesting a return call to RN Cm    Outreach to patient to update him [phone]  [facility] Unsuccessful outreach Outreach attempt to the listed at the preferred outreach number in EPIC  No answer. [facility] RN CM left HIPAA [facility] Portability and Accountability Act) compliant voicemail message along with CM's contact info.     Plan [facility] RN CM will follow up with patient within the next 14 business days    Valbona Slabach [name], RN, BSN, [place] Coordinator Office number [phone]

## 2022-02-25 DIAGNOSIS — Z7689 Persons encountering health services in other specified circumstances: Secondary | ICD-10-CM | POA: Diagnosis not present

## 2022-02-27 DIAGNOSIS — Z7689 Persons encountering health services in other specified circumstances: Secondary | ICD-10-CM | POA: Diagnosis not present

## 2022-02-28 ENCOUNTER — Other Ambulatory Visit: Payer: Self-pay | Admitting: *Deleted

## 2022-02-28 DIAGNOSIS — Z7689 Persons encountering health services in other specified circumstances: Secondary | ICD-10-CM | POA: Diagnosis not present

## 2022-02-28 NOTE — Patient Outreach (Signed)
[facility]) Care Management Telephonic RN Care Manager Note   02/28/2022 Name:  [name] MRN:  [id number] DOB:  02/22/1966  Summary: Noted a return message from pt left after [facility] office closed for [place] holidays on 02/24/22 1732 - reporting he may have been sleeping when RN CM attempted to reach him 02/24/22  Unsuccessful outreach to patient today after RN CM successfully reached [place] [facility] staff [place] [phone] Colletta Maryland confirms the patient has not been seen at the office as the appointment was cancelled by the patient on 02/18/22. He has never been seen at the [place] [facility]. The office policy is to no longer see a patient after 2 no shows.   Recommendations/Changes made from today's visit: Unsuccessful attempt to reach patient for a conference call while speaking with Colletta [place] at [facility] wound care center to reschedule the cancelled 02/21/22 appointment  Message left for pt to inform him of the attempted plan of care of today and requesting a return call to RN CM Outreach  to [place] at [place] [facility] 336 [phone] To discussed pt care concerns. She reports the patient has been in their care for a long time and the pt's aide was provided a video of wound care but is not comfortable with during the care. Janett Billow also confirms she informed staff prior to pt hospital discharge that [facility] would not be able to assist with the wound care   Subjective: [name] is an 56 y.o. year old male who is a primary patient of [name], NP. The care management team was consulted for assistance with care management and/or care coordination needs. He was referred to this RN CM on 02/13/22 after he was discharged on 02/12/22. He was successfully engaged on 02/17/22.  Telephonic RN Care Manager completed Telephone Visit today.   Objective:  Medications Reviewed Today     Reviewed by Lannette Donath, CPhT (Pharmacy  Technician) on 01/16/22 at [place]  Med List Status: Complete   Medication Order Taking? Sig Documenting Provider Last Dose Status Informant  allopurinol (ZYLOPRIM) 100 MG tablet [id number] Yes Take 1 tablet (100 mg total) by mouth daily. [name], MD 01/14/2022 Active Self  Ascorbic Acid (VITAMIN C PO) [id number] Yes Take 1 tablet by mouth every morning. [provider] 01/15/2022 Active Self  aspirin EC 81 MG tablet [id number] Yes Take 81 mg by mouth every morning. Swallow whole. [provider] 01/15/2022 Active Self  benzocaine (ANBESOL) 10 % mucosal gel [id number] Yes Use as directed 1 application. in the mouth or throat daily as needed for mouth pain. [provider] 01/14/2022 Active Self  Blood Pressure Monitor DEVI [id number]  Please provide patient with insurance approved blood pressure monitor. ICD10 I.10 Gildardo Pounds, NP  Active Self  Calcium Carbonate-Vitamin D (CALCIUM-D PO) [id number] Yes Take 1 tablet by mouth every morning. [provider] 01/15/2022 Active Self  clotrimazole (LOTRIMIN) 1 % cream [id number] Yes Apply topically 2 (two) times daily.  Patient taking differently: Apply 1 application. topically 2 (two) times daily.   Mitzi Hansen, MD 01/14/2022 Active Self  diclofenac Sodium (VOLTAREN) 1 % GEL [id number] Yes Apply 2 g topically 3 (three) times daily. [provider] 01/15/2022 Active Self  famotidine (PEPCID) 20 MG tablet [id number] Yes Take 1 tablet (20 mg total) by mouth daily. [name], MD 01/15/2022 Active Self  fluconazole (DIFLUCAN) 150 MG tablet [id number] Yes Take 150 mg by mouth once a  week. [provider] 01/09/2022 Active Self  liver oil-zinc oxide (DESITIN) 40 % ointment [id number] Yes Apply 1 application topically daily. [provider] 01/15/2022 Active Self  lubiprostone (AMITIZA) 24 MCG capsule [id number] Yes TAKE 1 CAPSULE (24 MCG TOTAL) BY MOUTH 2 (TWO) TIMES DAILY WITH A MEAL. Loralie Champagne, PA-C 01/15/2022 Active Self           Med Note (COFFELL, ANGELA M   Tue Dec 20, 2021 10:42 AM)    methadone (DOLOPHINE) 10 MG/5ML solution [id number] Yes Take 50 mg by mouth daily. [provider] 01/15/2022 Active Multiple Informants           Med Note [place] Crane, MISTY D   Mon Jan 16, 2022  5:43 AM) Per Verdie Drown at [facility] pt last doses at 50 mg qd on 01/10/22 with 1 week of take home doses  Olmesartan-amLODIPine-HCTZ 40-10-25 MG TABS [id number] Yes Take 1 tablet by mouth daily. [name], MD 01/15/2022 Active Self  OVER THE COUNTER MEDICATION [id number] Yes Take 3 capsules by mouth every morning. Omega XL [provider] 01/15/2022 Active Self  Oxymetazoline HCl (NASAL SPRAY NA) [id number] Yes Place 1 spray into both nostrils daily as needed (congestion). Family care nasal spray [provider] 01/13/2022 Active Self  polyethylene glycol (MIRALAX / GLYCOLAX) 17 g packet [id number] Yes Take 17 g by mouth every morning. [provider] 01/14/2022 Active Self  saccharomyces boulardii (FLORASTOR) 250 MG capsule [id number] Yes Take 250 mg by mouth daily. [provider] 01/14/2022 Active Self  Med List Note Acie Fredrickson, Dionne Bucy, CPhT 12/20/21 1200): [place] [phone] Open 5:30 AM-2 PM Monday-Friday, 5:30 AM- 7:30 AM Saturday, closed on Sunday             SDOH:  (Social Determinants of Health) assessments and interventions performed:    Care Plan  Review of patient past medical history, allergies, medications, health status, including review of consultants reports, laboratory and other test data, was performed as part of comprehensive evaluation for care management services.   There are no care plans that you recently modified to display for this patient.    Plan: The patient has been provided with contact information for the care management team and has been advised to call with any health related questions or  concerns.  The care management team will reach out to the patient again over the next 30+ business days.  Unsuccessful outreaches 02/24/22, 02/28/22  Kweli Grassel [name], RN, BSN, [place] Coordinator Office number [phone] Main [facility] number [phone] Fax number [phone]

## 2022-03-01 ENCOUNTER — Other Ambulatory Visit: Payer: Self-pay | Admitting: *Deleted

## 2022-03-01 DIAGNOSIS — Z7689 Persons encountering health services in other specified circumstances: Secondary | ICD-10-CM | POA: Diagnosis not present

## 2022-03-01 NOTE — Patient Outreach (Signed)
[facility]) Care Management  03/01/2022  Treylin Nardone Apr 17, 1966 [id number]   [place] coordination- home services attempts, follow up with patient    RN CM outreach to [facility] at [phone] fax [phone] Initial message left at the referral prompt #1 Requesting a return call to RN CM Left RN CM outreach office number  RN CM routed pt face sheet/demo/insurance + discharge summary (containing wound care) to [facility]  RN CM outreach to [facility] home [facility] [phone] spoke with [name] fax [phone] provided to send another referral if needed [name] confirmed the 01/19/22 referral was declined    Outreach to patient successfully RN CM updated pt on care coordination calls made 02/24/22, 02/28/22 and 03/01/22  As RN CM was attempting to get clarity on how the wound care clinic appointment was cancelled, Mr Javid' increased his pace and tone of speech as he began to verbalize his frustrations. He confirms he cancelled the wound care center appointment as he mistakenly thought the transportation for it would be via [employer] [facility]. RN CM had reviewed with him several times and it was on his discharge instructions that this transport was via the local medicaid transportation services (this is also why this RN CM made sure he wrote down the medicaid transport number on 02/17/22)  Allowed him to ventilate his feelings He repeatedly discussed that his present medical issues are related to "what they did to me" ,"They lied to me" Empathized with Mr Hemric  Mr Sedore was given [facility] patient experience number [phone] and [facility] number [phone]  He was assisted by RN CM completing an e-mail, cc him on the e-mail to [facility] requesting his medical records for his [place] wound care center care from 2018 to 2023 to include pictures  Reminded pt of the importance of attending and  getting transportation to his new pcp appointment on 03/10/22 0930 ( he wrote this down again) and [place] [facility]  Discussed the wound care clinic office policy related to dismissing patients for 2 no shows or missed appointments.  Plan [facility] RN CM will follow up with patient within the next 3 business days    Emmabelle Fear [name], RN, BSN, [place] Coordinator Office number [phone]

## 2022-03-02 ENCOUNTER — Other Ambulatory Visit: Payer: Self-pay | Admitting: *Deleted

## 2022-03-02 NOTE — Patient Outreach (Addendum)
[facility]) Care Management Telephonic RN Care Manager Note   03/02/2022 Name:  [name] MRN:  [id number] DOB:  1965/12/23  Summary: Follow up with patient for care coordination- calls to [facility], [facility]  Reports he is lying on his bed today and he is not able to move his leg with his hand He reports pain in his leg and he has to repositions frequently He confirmed he was informed he was not able to go to a facility related to his methadone treatments He now prefers now to remain at his home and not go to a facility  He does not prefer [facility] he had a bad experience with drivers not showing up He had a good experience with [facility]  Agrees to attempt to use [place] services if available [phone] Pt called and left a message stating [facility] called to offer an appointment for March 21 2022 0930 and to inform him that the provider would determine then how often the patient needs to return to be seen [name] has had someone to change his dressing this week on "Tuesday" " I have to find a way to care for me"  Recommendations/Changes made from today's visit: Conference outreach with patient & Spoke with Margarita Grizzle at [facility] [phone]  She explained to the pt & RN CM the barriers with [facility] not being able receive pt for services- not a home health agency, unable to do the wound care, provides pcp services, Limited [employer] home healthcare agencies Discussed how insurance is paid, primary & secondary with patient Allowed him to ventilate his feelings again Encouraged him to call patient experience line again [phone]  Conference outreach with patient to [facility] [phone] No answer Left a message for someone to call the patient to reschedule an appointment Requested pt to speak with wound care clinic when staff call to make a new appointment and to  inquire if he could be seen at lease once a week for wound care as he states he would be willing to attend Allowed him to ventilate his feelings Inquired if patient considered facility placement Allowed him to ventilate his feelings Discussed Possible use of [facility] for transportation Conference outreach with patient to [facility] transportation [phone] long wait time without an answer  Spoke with Gaspar Skeeters at [place] [employer] [phone] to schedule his transportation for his  March 10 2022 0930 new pcp office visit to Juluis Mire & his March 21 2022 0930 [facility] Gaspar Skeeters is not able to make a promise but entered in pt preferences for not using of [facility] services She noted it was not noted on his chart prior to this.  He will be called prior to rides and pick up 0830 expected for each   Subjective: [name] is an 56 y.o. year old male who is a primary patient of [name], NP. The care management team was consulted for assistance with care management and/or care coordination needs.    Telephonic RN Care Manager completed Telephone Visit today.   Objective:  Medications Reviewed Today     Reviewed by Barbaraann Faster, RN (Registered Nurse) on 03/01/22 at 1628  Med List Status: <None>   Medication Order Taking? Sig Documenting Provider Last Dose Status Informant  acetaminophen (TYLENOL) 500 MG tablet [id number]  Take  2 tablets (1,000 mg total) by mouth 3 (three) times daily as needed for mild pain or moderate pain (or Fever >/= 101). [name] Duff, MD  Active   allopurinol (ZYLOPRIM) 100 MG tablet [id number] No Take 1 tablet (100 mg total) by mouth daily. [name], MD 01/14/2022 Active Self  ascorbic acid (VITAMIN C) 500 MG tablet [id number]  Take 1 tablet (500 mg total) by mouth 2 (two) times daily. [name] Duff, MD  Active   aspirin EC 81 MG tablet [id number] No Take 81 mg by mouth every morning. Swallow  whole. [provider] 01/15/2022 Active Self  benzocaine (ANBESOL) 10 % mucosal gel [id number] No Use as directed 1 application. in the mouth or throat daily as needed for mouth pain. [provider] 01/14/2022 Active Self  Blood Pressure Monitor DEVI [id number] No Please provide patient with insurance approved blood pressure monitor. ICD10 I.10 Gildardo Pounds, NP Taking Active Self  Calcium Carbonate-Vitamin D (CALCIUM-D PO) [id number] No Take 1 tablet by mouth every morning. [provider] 01/15/2022 Active Self  Chlorhexidine Gluconate Cloth 2 % PADS [id number]  Apply 6 each topically daily. [name] Duff, MD  Active   clotrimazole (LOTRIMIN) 1 % cream [id number] No Apply topically 2 (two) times daily.  Patient taking differently: Apply 1 application. topically 2 (two) times daily.   Mitzi Hansen, MD 01/14/2022 Active Self  diclofenac Sodium (VOLTAREN) 1 % GEL [id number] No Apply 2 g topically 3 (three) times daily. [provider] 01/15/2022 Active Self  famotidine (PEPCID) 20 MG tablet [id number] No Take 1 tablet (20 mg total) by mouth daily. [name], MD 01/15/2022 Active Self  famotidine (PEPCID) 40 MG tablet [id number]  SMARTSIG:1 Tablet(s) By Mouth Morning-Night [provider]  Active   feeding supplement (ENSURE ENLIVE / ENSURE PLUS) LIQD [id number]  Take 237 mLs by mouth 2 (two) times daily between meals. [name] Duff, MD  Active   ferrous sulfate 325 (65 FE) MG tablet [id number]  Take 1 tablet (325 mg total) by mouth daily with breakfast. [name] Duff, MD  Active   ibuprofen (ADVIL) 400 MG tablet [id number]  Take 1 tablet (400 mg total) by mouth every 8 (eight) hours as needed. [name] Duff, MD  Active   liver oil-zinc oxide (DESITIN) 40 % ointment [id number] No Apply 1 application topically daily. [provider] 01/15/2022 Active Self  lubiprostone (AMITIZA) 24 MCG capsule [id number]  TAKE 1 CAPSULE (24 MCG TOTAL) BY  MOUTH 2 (TWO) TIMES DAILY WITH A MEAL. Zehr, Laban Emperor, PA-C  Active   magnesium hydroxide (MILK OF MAGNESIA) 400 MG/5ML suspension [id number]  Take 30 mLs by mouth daily. [name] Duff, MD  Active   methadone (DOLOPHINE) 10 MG/5ML solution [id number] No Take 50 mg by mouth daily. [provider] 01/15/2022 Active Multiple Informants           Med Note [place] Crane, MISTY D   Mon Jan 16, 2022  5:43 AM) Per Verdie Drown at [facility] pt last doses at 50 mg qd on 01/10/22 with 1 week of take home doses  Multiple Vitamin (MULTIVITAMIN WITH MINERALS) TABS tablet [id number]  Take 1 tablet by mouth daily. [name] Duff, MD  Active   nutrition supplement, Leanord Asal) PACK [id number]  Take 1 packet by mouth 2 (two) times daily between meals. [name] Duff, MD  Active   OVER THE COUNTER MEDICATION [id number] No Take 3 capsules by mouth every morning. Omega XL [provider] 01/15/2022 Active Self  Oxymetazoline HCl (NASAL SPRAY NA) [id number]  No Place 1 spray into both nostrils daily as needed (congestion). Family care nasal spray [provider] 01/13/2022 Active Self  polyethylene glycol (MIRALAX / GLYCOLAX) 17 g packet [id number]  Take 17 g by mouth 2 (two) times daily. [name] Duff, MD  Active   predniSONE (DELTASONE) 20 MG tablet [id number]  Take 40 mg by mouth every morning. [provider]  Active   saccharomyces boulardii (FLORASTOR) 250 MG capsule [id number] No Take 250 mg by mouth daily. [provider] 01/14/2022 Active Self  senna-docusate (SENOKOT-S) 8.6-50 MG tablet [id number]  Take 2 tablets by mouth 2 (two) times daily. [name] Duff, MD  Active   Med List Note Acie Fredrickson, Dionne Bucy, CPhT 12/20/21 1200): [place] [phone] Open 5:30 AM-2 PM Monday-Friday, 5:30 AM- 7:30 AM Saturday, closed on Sunday             SDOH:  (Social Determinants of Health) assessments and interventions performed:    Care Plan  Review of  patient past medical history, allergies, medications, health status, including review of consultants reports, laboratory and other test data, was performed as part of comprehensive evaluation for care management services.   There are no care plans that you recently modified to display for this patient.    Plan: The patient has been provided with contact information for the care management team and has been advised to call with any health related questions or concerns.  The care management team will reach out to the patient again over the next 30+ business days.  Johnnie Moten [name], RN, BSN, [place] Coordinator Office number [phone] [facility] number [phone] Fax number [phone]

## 2022-03-08 ENCOUNTER — Other Ambulatory Visit: Payer: Self-pay | Admitting: *Deleted

## 2022-03-08 NOTE — Patient Outreach (Signed)
[facility]) Care Management Telephonic RN Care Manager Note   03/08/2022 Name:  [name] MRN:  [id number] DOB:  1966/07/23  Summary: Online search for home health agencies and wound care in network providers on  [employer] care [facility]) site from pt insurance card Follow up outreach with pt, review of RN CM interventions to schedule his transportation for 03/10/22 & 03/21/22 appointments with DSS medicaid transport requesting his preference not to use [employer] He agrees to call RN Cm if he does not receive a call about his transport for 03/10/22 He confirms his urology appointment has been rescheduled for 05/18/22 [address] [place]   Recommendations/Changes made from today's visit: Follow call to patient completed Assessed for changes updated on care coordination for appointment transportation and continued search for in network home health wound care agencies via  [employer] care [facility]) online site [email or web address] from his insurance card No success in finding agencies Discussed with pt the need for him and RN CM to outreach to united healthcare via telephone to attempt to receive possible outliner assistance if possible  Encouraged pt to call RN CM if he does not receive a call on 03/09/22 about his transport to his appointment for 03/10/22  Spoke with Butch Penny at [address] transportation to confirm pt is scheduled for [facility] on 03/10/22 & on 03/21/22 and will receive a call the day before each appointment  Subjective: [name] is an 56 y.o. year old male who is a primary patient of [name], NP. The care management team was consulted for assistance with care management and/or care coordination needs.    Telephonic RN Care Manager completed Telephone Visit today.    Mr Albornoz was referred to [facility] on 02/13/22 for post hospital services He was [facility] on 02/12/22 He was hospitalized  from 01/16/22 to 1[birth year]04/58 - sepsis, complicated UTI. He was successfully engaged with [facility] on 02/17/22   Objective:  Medications Reviewed Today     Reviewed by Barbaraann Faster, RN (Registered Nurse) on 03/02/22 at 43  Med List Status: <None>   Medication Order Taking? Sig Documenting Provider Last Dose Status Informant  acetaminophen (TYLENOL) 500 MG tablet [id number]  Take 2 tablets (1,000 mg total) by mouth 3 (three) times daily as needed for mild pain or moderate pain (or Fever >/= 101). [name] Duff, MD  Active   allopurinol (ZYLOPRIM) 100 MG tablet [id number] No Take 1 tablet (100 mg total) by mouth daily. [name], MD 01/14/2022 Active Self  ascorbic acid (VITAMIN C) 500 MG tablet [id number]  Take 1 tablet (500 mg total) by mouth 2 (two) times daily. [name] Duff, MD  Active   aspirin EC 81 MG tablet [id number] No Take 81 mg by mouth every morning. Swallow whole. [provider] 01/15/2022 Active Self  benzocaine (ANBESOL) 10 % mucosal gel [id number] No Use as directed 1 application. in the mouth or throat daily as needed for mouth pain. [provider] 01/14/2022 Active Self  Blood Pressure Monitor DEVI [id number] No Please provide patient with insurance approved blood pressure monitor. ICD10 I.10 [name] Pounds, NP Taking Active Self  Calcium Carbonate-Vitamin D (CALCIUM-D PO) [id number] No Take 1 tablet by mouth every morning. [provider] 01/15/2022 Active Self  Chlorhexidine Gluconate Cloth 2 % PADS [id number]  Apply 6 each topically daily. [name] Duff, MD  Active   clotrimazole (LOTRIMIN) 1 % cream [id number] No Apply  topically 2 (two) times daily.  Patient taking differently: Apply 1 application. topically 2 (two) times daily.   Mitzi Hansen, MD 01/14/2022 Active Self  diclofenac Sodium (VOLTAREN) 1 % GEL [id number] No Apply 2 g topically 3 (three) times daily. [provider] 01/15/2022 Active Self  famotidine (PEPCID) 20 MG  tablet [id number] No Take 1 tablet (20 mg total) by mouth daily. [name], MD 01/15/2022 Active Self  famotidine (PEPCID) 40 MG tablet [id number]  SMARTSIG:1 Tablet(s) By Mouth Morning-Night [provider]  Active   feeding supplement (ENSURE ENLIVE / ENSURE PLUS) LIQD [id number]  Take 237 mLs by mouth 2 (two) times daily between meals. [name] Duff, MD  Active   ferrous sulfate 325 (65 FE) MG tablet [id number]  Take 1 tablet (325 mg total) by mouth daily with breakfast. [name] Duff, MD  Active   ibuprofen (ADVIL) 400 MG tablet [id number]  Take 1 tablet (400 mg total) by mouth every 8 (eight) hours as needed. [name] Duff, MD  Active   liver oil-zinc oxide (DESITIN) 40 % ointment [id number] No Apply 1 application topically daily. [provider] 01/15/2022 Active Self  lubiprostone (AMITIZA) 24 MCG capsule [id number]  TAKE 1 CAPSULE (24 MCG TOTAL) BY MOUTH 2 (TWO) TIMES DAILY WITH A MEAL. Zehr, Laban Emperor, PA-C  Active   magnesium hydroxide (MILK OF MAGNESIA) 400 MG/5ML suspension [id number]  Take 30 mLs by mouth daily. [name] Duff, MD  Active   methadone (DOLOPHINE) 10 MG/5ML solution [id number] No Take 50 mg by mouth daily. [provider] 01/15/2022 Active Multiple Informants           Med Note [place] [name], [name] D   Mon Jan 16, 2022  5:43 AM) Per Verdie Drown at [facility] pt last doses at 50 mg qd on 01/10/22 with 1 week of take home doses  Multiple Vitamin (MULTIVITAMIN WITH MINERALS) TABS tablet [id number]  Take 1 tablet by mouth daily. [name] Duff, MD  Active   nutrition supplement, Leanord Asal) PACK [id number]  Take 1 packet by mouth 2 (two) times daily between meals. [name] Duff, MD  Active   OVER THE COUNTER MEDICATION [id number] No Take 3 capsules by mouth every morning. Omega XL [provider] 01/15/2022 Active Self  Oxymetazoline HCl (NASAL SPRAY NA) [id number] No Place 1 spray into both nostrils daily as needed (congestion). Family  care nasal spray [provider] 01/13/2022 Active Self  polyethylene glycol (MIRALAX / GLYCOLAX) 17 g packet [id number]  Take 17 g by mouth 2 (two) times daily. [name] Duff, MD  Active   predniSONE (DELTASONE) 20 MG tablet [id number]  Take 40 mg by mouth every morning. [provider]  Active   saccharomyces boulardii (FLORASTOR) 250 MG capsule [id number] No Take 250 mg by mouth daily. [provider] 01/14/2022 Active Self  senna-docusate (SENOKOT-S) 8.6-50 MG tablet [id number]  Take 2 tablets by mouth 2 (two) times daily. [name] Duff, MD  Active   Med List Note Acie Fredrickson, Dionne Bucy, CPhT 12/20/21 1200): [place] [phone] Open 5:30 AM-2 PM Monday-Friday, 5:30 AM- 7:30 AM Saturday, closed on Sunday             SDOH:  (Social Determinants of Health) assessments and interventions performed:    Care Plan  Review of patient past medical history, allergies, medications, health status, including review of consultants reports, laboratory and other test data, was performed as part of comprehensive evaluation for care management services.   Care Plan : RN Care Manager Plan  of Care  Updates made by Barbaraann Faster, RN since 03/08/2022 12:00 AM     Problem: Complex Care Coordination Needs and disease management in patient with HTN, transportation, appointments   Priority: High  Onset Date: 02/17/2022     Long-Range Goal: Establish Plan of Care for Management Complex SDOH Barriers, disease management and Care Coordination Needs in patient with HTN, transportation, appointments   Start Date: 02/17/2022  Recent Progress: Not on track  Priority: High  Note:   Current Barriers:  Knowledge Deficits related to plan of care for management of HTN and chronic pain, social determinant of health needs  Care Coordination needs related to Transportation and Lacks knowledge of community resource: transportation Transportation barriers Barriers:  compliance/engagement- missing/no show/cancelling medical appointments Unsuccessful outreaches 02/24/22, 02/28/22  Pt cancelled appointments 02/21/22 [facility] /pt 02/18/22- rescheduled to 03/21/22 0930 Urology procedure for 03/03/22 cancelled by pt  RN CM Clinical Goal(s):  Patient will verbalize understanding of plan for management of HTN and chronic pain plus social determinant needs as evidenced by completion of follow up appointments, decrease re admission risks  through collaboration with RN Care manager, provider, and care team.   Interventions: Outreaches for care coordination, disease management, resources, home care/education needs Inter-disciplinary care team collaboration (see longitudinal plan of care) Evaluation of current treatment plan related to  self management and patient's adherence to plan as established by provider   Interdisciplinary Collaboration Interventions:  (Status:  continuing to work on improving resources ) Short Term Goal   Collaborated with RN CM to initiate plan of care to address needs related to Transportation in patient with HTN and chronic pain, substance use Collaboration with [name], NP , various home health offices, [facility] [place] & [facility] (stephanie-[phone]), medicaid transportation , united healthcare transportation Inter-disciplinary care team collaboration 02/17/22 Outreach with patient to [place] for attempt to get assist for transportation for 02/20/22 methadone appointment Spoke with April at [phone] medicaid transportation Confirmed he has a transportation booking for 02/21/22 but is not able to get assist for 02/20/22 related to needing to call in 3 days prior to appointment April shared with pt that his local DSS medicaid case worker is Rosanna [name]  [phone] RN CM reviewed with him the numbers for his DSS case worker, DSS  transportation and united healthcare transportation plus waited for him to write the information down. RN CM also encouraged him to share this with [name]  Outreach with patient to  [facility]) [phone] spoke with Ravin to assist with transportation for 02/20/22 0700 and 02/21/22 0600 to methadone clinic [facility] [address] [place], [place] [address]  [phone]  02/20/22 Confirm #[id number] pick up 0618, pt to call #[phone] 02/21/22 pick up 0511 confirm #[id number], 0630 #[id number]  RN CM outreached to [place] [phone] after discussing with patient about he missed 11/11/21 appointment during his hospitalization. He gave permission for RN CM to outreach to see if another visit could be scheduled  He was offered the earliest appointment for hospital follow up as Friday June 9 at 0930 with Juluis Mire   Unsuccessful attempts x 2 to [facility] [place] [id number]  02/20/22 -Returned pt outreach and Allowed him to ventilate his feelings Encouragement provided Updated  him on his upcoming 03/10/22 new pcp appointment with Juluis Mire 03/10/22 0930 that RN CM assisted with scheduling on 02/17/22 Discussed with him this RN CM attempt to reach staff at [facility] on 02/17/22 without success Provided him with his my chart user name and discussed providing education on its use in future 02/22/22 Completed follow up outreach  Allow him to ventilate again  Assessed possible other home health agencies he has had services with before  Attempt to call [phone] [facility] Dr [facility] to cancel the anterior urethroplasty, possible buccal mucosa graft Provided the patient the number to attempt to call Dr Francesca Jewett on 02/23/22 to cancel this appointment Review of EPIC to note on 4/20-24/23 In patient RN CM called these agencies who declined the patient for services [facility], [facility] due to patient was active with their services before but was non compliant with treatment, disrespectful to staff and chronic wound will not be covered by insurance. [place] patient due to staffing. [facility]- Declined due to being at capacity for [facility] referrals at this time. [facility]- No RN available for wound care in patient's area of residence. [facility]- No response [facility]- Not able to accept at this time  02/24/22 RN CM outreached to the following home health agencies that referrals were sent to prior to patient 02/12/22 discharge to check on status update of referrals  [name] [phone] left voice message- previous staffing issues- [name] return a call and confirms no available staff to do extensive wound care  [facility]- [phone] [name] confirms they are still at capacity for [place] at [facility] confirmed the dr at the wound care cancelled the patient's Feb 14 2022 wound care visit Consulted [facility] leadership  Attempted to reach staff at [facility] wound care clinic [phone] to discuss recent visit & frequency, type of dressing changes for pt sacral wound Left a message requesting a return call to RN Cm   RN CM checked pt's list of appointments in [facility] and it does appear his June 2023 urology surgery has been cancelled   Attempted again to reach staff at [facility] wound care clinic [phone] to discuss recent visit & frequency, type of dressing changes for pt sacral wound Left a message requesting a return call to RN Cm   Outreach to patient to update him [phone]  [facility] Unsuccessful outreach Outreach attempt to the listed at the preferred outreach number in EPIC  No answer. [facility] RN CM left HIPAA [facility] Portability and Accountability Act) compliant voicemail message along with CM's contact info.    02/28/22 Unsuccessful attempt to reach patient for a conference call while speaking with  Colletta Maryland at [facility] wound care center to reschedule the cancelled 02/21/22 appointment  Message left for pt to inform him of the attempted plan of care of today and requesting a return call to RN CM Outreach  to Lenox Dale at [place] [facility] [phone] To discussed pt care concerns. She reports the patient has been in their care for a long time and the pt's aide was provided a video of wound care but is not comfortable with during the care. Janett Billow also confirms she informed staff prior to pt hospital discharge that [facility] would not be able to assist with the wound care  03/01/22 RN CM outreach to [facility] at [phone] fax [phone] Initial message left at the  referral prompt #1 Requesting a return call to RN CM Left RN CM outreach office number  RN CM routed pt face sheet/demo/insurance + discharge summary (containing wound care) to [facility]  RN CM outreach to [facility] [phone] spoke with [name] fax [phone] provided to send another referral if needed [name] confirmed the 01/19/22 referral was declined   Outreach to patient successfully RN CM updated pt on care coordination calls made 02/24/22, 02/28/22 and 03/01/22  As RN CM was attempting to get clarity on how the wound care clinic appointment was cancelled, Mr Mazon' increased his pace and tone of speech as he began to verbalize his frustrations. He confirms he cancelled the wound care center appointment as he mistakenly thought the transportation for it would be via united healthcare services. RN CM had reviewed with him several times and it was on his discharge instructions that this transport was via the local medicaid transportation services (this is also why this RN CM made sure he wrote down the medicaid transport number on 02/17/22)  Allowed him to ventilate his feelings He repeatedly discussed that his present medical issues are related to "what they did to me" ,"They lied  to me" Empathized with Mr Alesi  Mr [place] was given [employer] patient experience number [phone] and [facility] de[facility] number [phone]  He was assisted by RN CM completing an e-mail, cc him on the e-mail to [facility] requesting his medical records for his [place] [facility] center care from 2018 to 2023 to include pictures  Reminded pt of the importance of attending and getting transportation to his new pcp appointment on 03/10/22 0930 ( he wrote this down again) and [facility]  Discussed the wound care clinic office policy related to dismissing patients for 2 no shows or missed appointments.  03/02/22 & 03/08/22 RN CM called medicaid transportation [phone] spoke with Lenora to schedule transport for 03/10/22 & 03/21/22 appointments with requests not to use [facility] services per pt preference.  Butch Penny confirmed on 03/08/22 transports scheduled with [facility] for each appointment and pt will get calls on 03/09/22 & 03/20/22 03/08/22 Follow call to patient completed Assessed for changes updated on care coordination for appointment transportation and continued search for in network home health wound care agencies via  [facility] care [facility]) online site [email or web address] from his insurance card No success in finding agencies Discussed with pt the need for him and RN CM to outreach to united healthcare via telephone to attempt to receive possible outliner assistance if possible  Encouraged pt to call RN CM if he does not receive a call on 03/09/22 about his transport to his appointment for 03/10/22    Hypertension Interventions:  (Status:  Goal on track:  Yes.) Short Term Goal Last practice recorded BP readings:  BP Readings from Last 3 Encounters:  02/12/22 (!) 112/51  12/30/21 (!) 112/51  12/22/21 127/77  Most recent eGFR/CrCl:  Lab Results  Component Value Date   EGFR 39 (L) 12/30/2021    No components found for: CRCL  Discussed plans with patient  for ongoing care management follow up and provided patient with direct contact information for care management team Discussed complications of poorly controlled blood pressure such as heart disease, stroke, circulatory complications, vision complications, kidney impairment, sexual dysfunction Screening for signs and symptoms of depression related to chronic disease state  Assessed social determinant of health  barriers  Pain Interventions:  (Status:  Goal on track:  Yes.) Long Term Goal 03/08/22 managing with home medicine regimen Pain assessment performed Medications reviewed Reviewed provider established plan for pain management Discussed importance of adherence to all scheduled medical appointments Counseled on the importance of reporting any/all new or changed pain symptoms or management strategies to pain management provider Screening for signs and symptoms of depression related to chronic disease state  Assessed social determinant of health barriers  Patient Goals/Self-Care Activities: Take all medications as prescribed Attend all scheduled provider appointments Attend church or other social activities Perform all self care activities independently  Call provider office for new concerns or questions   Follow Up Plan:  The patient has been provided with contact information for the care management team and has been advised to call with any health related questions or concerns.  The care management team will reach out to the patient again over the next 30 business days.       Plan: The patient has been provided with contact information for the care management team and has been advised to call with any health related questions or concerns.  The care management team will reach out to the patient again over the next 30+ business days.  Jeramia Saleeby [name], RN, BSN, [place] Coordinator Office number [phone] Main [facility] number [phone] Fax number  [phone]

## 2022-03-10 ENCOUNTER — Encounter (INDEPENDENT_AMBULATORY_CARE_PROVIDER_SITE_OTHER): Payer: Self-pay | Admitting: Primary Care

## 2022-03-10 ENCOUNTER — Ambulatory Visit (INDEPENDENT_AMBULATORY_CARE_PROVIDER_SITE_OTHER): Payer: Medicare Other | Admitting: Primary Care

## 2022-03-10 DIAGNOSIS — L89154 Pressure ulcer of sacral region, stage 4: Secondary | ICD-10-CM | POA: Diagnosis not present

## 2022-03-10 DIAGNOSIS — Z7689 Persons encountering health services in other specified circumstances: Secondary | ICD-10-CM | POA: Diagnosis not present

## 2022-03-10 DIAGNOSIS — Z09 Encounter for follow-up examination after completed treatment for conditions other than malignant neoplasm: Secondary | ICD-10-CM | POA: Diagnosis not present

## 2022-03-10 DIAGNOSIS — I10 Essential (primary) hypertension: Secondary | ICD-10-CM

## 2022-03-10 DIAGNOSIS — Z6841 Body Mass Index (BMI) 40.0 and over, adult: Secondary | ICD-10-CM | POA: Diagnosis not present
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
## 2022-03-13 ENCOUNTER — Encounter: Payer: Self-pay | Admitting: Infectious Diseases

## 2022-03-15 ENCOUNTER — Other Ambulatory Visit: Payer: Self-pay | Admitting: *Deleted

## 2022-03-15 NOTE — Patient Outreach (Signed)
[facility]) Care Management Telephonic RN Care Manager Note   03/20/2022 Name:  [name] MRN:  [id number] DOB:  1966-05-04  Summary: Follow up outreach to patient: updated on establishment of new pcp, wound , home health assistance inquiry of united health care staff plus other resources Confirmed he went to new primary care provider (pcp)  Pt voices not sure if pcp would not be able to manage his care He discussed interested in [place] not had dressing changed by home health but pt confirms he " do what needs to be done" to prevent drainage and odor from the site He reports the transportation services has improved with [facility]  Pt inquired about dental providers   Recommendations/Changes made from today's visit: Follow up outreach to patient: updated on establishment of new pcp, wound , home health assistance inquiry of united health care staff plus other resources   Conference with pt to [phone] united healthcare customer service Spoke with Learta Codding to inquire about available home health agencies (like [facility]) and [place] is a non participating provider  Pt does not have home health out of network benefit  [facility] is not in network home health agency Pt made aware Home health agencies that are in network includes [facility],  [facility], [facility], [facility], [facility], [facility],  [facility], [facility], [facility] These are some of the same agencies that referrals have already been sent to with no response, declines, limits in [place] healthcare clients or no wound care RN availability She reports Pt  is to give united health care a call back to customer service. She entered a noted with the home health agencies referrals have already been sent to by the hospital staff prior to discharge ([facility], [facility], [place], [place], [facility], [place], [place]) followed up by this RN CM, & referred to by this RN CM ([facility]) Dentists ---Learta Codding sent the pt a list of in network united healthcare dentists and primary care providers via his confirmed e-mail  Learta Codding will attempt to speak with her supervisor about the home health concern and outreach to pt and/or RN CM    Subjective: [name] is an 56 y.o. year old male who is a primary patient of [name], NP. The care management team was consulted for assistance with care management and/or care coordination needs.    Telephonic RN Care Manager completed Telephone Visit today.   Objective:  Medications Reviewed Today     Reviewed by Barbaraann Faster, RN (Registered Nurse) on 03/17/22 at 1149  Med List Status: <None>   Medication Order Taking? Sig Documenting Provider Last Dose Status Informant  acetaminophen (TYLENOL) 500 MG tablet [id number] No Take 2 tablets (1,000 mg total) by mouth 3 (three) times daily as needed for mild pain or moderate pain (or Fever >/= 101). [name] Duff, MD Taking Active   allopurinol (ZYLOPRIM) 100 MG tablet [id number] No Take 1 tablet (100 mg total) by mouth daily. [name], MD Taking Active Self  ascorbic acid (VITAMIN C) 500 MG tablet [id number] No Take 1 tablet (500 mg total) by mouth 2 (two) times daily. [name] Duff, MD Taking Active   aspirin EC 81 MG tablet [id number] No Take 81 mg by mouth every morning. Swallow whole. [provider] Taking Active Self  benzocaine (ANBESOL) 10 % mucosal gel [id number] No Use as directed 1 application. in the mouth or throat daily as needed for mouth pain. [provider] Taking  Active Self  Blood Pressure Monitor DEVI [id number] No Please provide patient with insurance approved blood pressure monitor. ICD10 I.10 Gildardo Pounds, NP Taking Active Self  Calcium Carbonate-Vitamin D (CALCIUM-D PO) [id number] No Take 1 tablet by mouth every morning. [provider] Taking Active Self  Chlorhexidine Gluconate Cloth 2 % PADS [id number] No  Apply 6 each topically daily. [name] Duff, MD Taking Active   clotrimazole (LOTRIMIN) 1 % cream [id number] No Apply topically 2 (two) times daily.  Patient taking differently: Apply 1 application  topically 2 (two) times daily.   Mitzi Hansen, MD Taking Active Self  diclofenac Sodium (VOLTAREN) 1 % GEL [id number] No Apply 2 g topically 3 (three) times daily. [provider] Taking Active Self  famotidine (PEPCID) 20 MG tablet [id number] No Take 1 tablet (20 mg total) by mouth daily. [name], MD Taking Active Self  famotidine (PEPCID) 40 MG tablet [id number] No SMARTSIG:1 Tablet(s) By Mouth Morning-Night [provider] Taking Active   feeding supplement (ENSURE ENLIVE / ENSURE PLUS) LIQD [id number] No Take 237 mLs by mouth 2 (two) times daily between meals. [name] Duff, MD Taking Active   ferrous sulfate 325 (65 FE) MG tablet [id number] No Take 1 tablet (325 mg total) by mouth daily with breakfast. [name] Duff, MD Taking Active   ibuprofen (ADVIL) 400 MG tablet [id number] No Take 1 tablet (400 mg total) by mouth every 8 (eight) hours as needed. [name] Duff, MD Taking Active   liver oil-zinc oxide (DESITIN) 40 % ointment [id number] No Apply 1 application topically daily. [provider] Taking Active Self  lubiprostone (AMITIZA) 24 MCG capsule [id number] No TAKE 1 CAPSULE (24 MCG TOTAL) BY MOUTH 2 (TWO) TIMES DAILY WITH A MEAL. Zehr, Laban Emperor, PA-C Taking Active   magnesium hydroxide (MILK OF MAGNESIA) 400 MG/5ML suspension [id number] No Take 30 mLs by mouth daily. [name] Duff, MD Taking Active   methadone (DOLOPHINE) 10 MG/5ML solution [id number] No Take 50 mg by mouth daily. [provider] Taking Active Multiple Informants           Med Note [place] Crane, MISTY D   Mon Jan 16, 2022  5:43 AM) Per Verdie Drown at [facility] pt last doses at 50 mg qd on 01/10/22 with 1 week of take home doses  Multiple Vitamin (MULTIVITAMIN WITH MINERALS) TABS  tablet [id number] No Take 1 tablet by mouth daily. [name] Duff, MD Taking Active   nutrition supplement, Leanord Asal) PACK [id number] No Take 1 packet by mouth 2 (two) times daily between meals. [name] Duff, MD Taking Active   OVER THE COUNTER MEDICATION [id number] No Take 3 capsules by mouth every morning. Omega XL [provider] Taking Active Self  Oxymetazoline HCl (NASAL SPRAY NA) [id number] No Place 1 spray into both nostrils daily as needed (congestion). Family care nasal spray [provider] Taking Active Self  polyethylene glycol (MIRALAX / GLYCOLAX) 17 g packet [id number] No Take 17 g by mouth 2 (two) times daily. [name] Duff, MD Taking Active   predniSONE (DELTASONE) 20 MG tablet [id number] No Take 40 mg by mouth every morning. [provider] Taking Active   saccharomyces boulardii (FLORASTOR) 250 MG capsule [id number] No Take 250 mg by mouth daily. [provider] Taking Active Self  senna-docusate (SENOKOT-S) 8.6-50 MG tablet [id number] No Take 2 tablets by mouth 2 (two) times daily. [name] Duff, MD Taking Active   Med List Note Acie Fredrickson, Dionne Bucy, CPhT 12/20/21 1200): [place] Open 5:30 AM-2  PM Monday-Friday, 5:30 AM- 7:30 AM Saturday, closed on Sunday             SDOH:  (Social Determinants of Health) assessments and interventions performed:    Care Plan  Review of patient past medical history, allergies, medications, health status, including review of consultants reports, laboratory and other test data, was performed as part of comprehensive evaluation for care management services.   Care Plan : RN Care Manager Plan of Care  Updates made by Barbaraann Faster, RN since 03/20/2022 12:00 AM     Problem: Complex Care Coordination Needs and disease management in patient with HTN, transportation, appointments   Priority: High  Onset Date: 02/17/2022     Long-Range Goal: Establish Plan  of Care for Management Complex SDOH Barriers, disease management and Care Coordination Needs in patient with HTN, transportation, appointments   Start Date: 02/17/2022  Recent Progress: Not on track  Priority: High  Note:   Current Barriers:  Knowledge Deficits related to plan of care for management of HTN and chronic pain, social determinant of health needs  Care Coordination needs related to Transportation and Lacks knowledge of community resource: transportation Transportation barriers Barriers: compliance/engagement- missing/no show/cancelling medical appointments, conflicting transportation resources that no show or does not provide appropriate options to get to appointments [facility] & DSS/Medicaid) leading to missed appointments, limited local wound care home health service staff in network Unsuccessful outreaches 02/24/22, 02/28/22  Pt cancelled appointments 02/21/22 [facility] /pt 02/18/22- rescheduled to 03/21/22 0930 Urology procedure for 03/03/22 cancelled by pt  RN CM Clinical Goal(s):  Patient will verbalize understanding of plan for management of HTN and chronic pain plus social determinant needs as evidenced by completion of follow up appointments, decrease re admission risks  through collaboration with RN Care manager, provider, and care team.   Interventions: Outreaches for care coordination, disease management, resources, home care/education needs Inter-disciplinary care team collaboration (see longitudinal plan of care) Evaluation of current treatment plan related to  self management and patient's adherence to plan as established by provider   Interdisciplinary Collaboration Interventions:  (Status:  continuing to work on improving resources ) Short Term Goal   Collaborated with RN CM to initiate plan of care to address needs related to Transportation in patient with HTN and chronic pain, substance use Collaboration with [name], NP , various home health  offices, [facility] [place]), [place] [facility] & [facility] (stephanie-[phone]), medicaid transportation , united healthcare transportation Inter-disciplinary care team collaboration 02/17/22 Outreach with patient to [place] for attempt to get assist for transportation for 02/20/22 methadone appointment Spoke with April at [phone] medicaid transportation Confirmed he has a transportation booking for 02/21/22 but is not able to get assist for 02/20/22 related to needing to call in 3 days prior to appointment April shared with pt that his local DSS medicaid case worker is Rosanna [name]  [phone] RN CM reviewed with him the numbers for his DSS case worker, DSS transportation and united healthcare transportation plus waited for him to write the information down. RN CM also encouraged him to share this with [name]  Outreach with patient to  [facility] care [facility]) [phone] spoke with Ravin to assist with transportation for 02/20/22 0700 and 02/21/22 0600 to methadone clinic [facility] [address] [place] [place], [place] [address]  [phone]  02/20/22  Confirm #[id number] pick up 0618, pt to call #[phone] 02/21/22 pick up 0511 confirm #[id number], 0630 #[phone]  RN CM outreached to [place] [phone] after discussing with patient about he missed 11/11/21 appointment during his hospitalization. He gave permission for RN CM to outreach to see if another visit could be scheduled  He was offered the earliest appointment for hospital follow up as Friday June 9 at 0930 with Juluis Mire   Unsuccessful attempts x 2 to [facility] [place] [address]  02/20/22 -Returned pt outreach and Allowed him to ventilate his feelings Encouragement provided Updated him on his upcoming 03/10/22 new pcp appointment with Juluis Mire 03/10/22 0930 that RN CM  assisted with scheduling on 02/17/22 Discussed with him this RN CM attempt to reach staff at [facility] on 02/17/22 without success Provided him with his my chart user name and discussed providing education on its use in future 02/22/22 Completed follow up outreach  Allow him to ventilate again  Assessed possible other home health agencies he has had services with before  Attempt to call [phone] [facility] Dr [facility] Jew[facility] to cancel the anterior urethroplasty, possible buccal mucosa graft Provided the patient the number to attempt to call Dr Francesca Jewett on 02/23/22 to cancel this appointment Review of EPIC to note on 4/20-24/23 In patient RN CM called these agencies who declined the patient for services [facility], [place] due to patient was active with their services before but was non compliant with treatment, disrespectful to staff and chronic wound will not be covered by insurance. [facility] patient due to staffing. [facility]- Declined due to being at capacity for [facility] referrals at this time. [facility]- No RN available for wound care in patient's area of residence. [facility]- No response [facility]- Not able to accept at this time  02/24/22 RN CM outreached to the following home health agencies that referrals were sent to prior to patient 02/12/22 discharge to check on status update of referrals  [name] [phone] left voice message- previous staffing issues- [name] return a call and confirms no available staff to do extensive wound care  [id number] [name] confirms they are still at capacity for [place] [facility] [place] confirmed the dr at the wound care cancelled the patient's Feb 14 2022 wound care visit Consulted [facility] leadership  Attempted to reach staff at [facility] wound care clinic [phone] to discuss recent visit & frequency, type of dressing changes for pt sacral wound Left a message requesting a  return call to RN Cm   RN CM checked pt's list of appointments in [facility] and it does appear his June 2023 urology surgery has been cancelled   Attempted again to reach staff at [facility] point wound care clinic [phone] to discuss recent visit & frequency, type of dressing changes for pt sacral wound Left a message requesting a return call to RN Cm   Outreach to patient to update him [phone]  [facility] Unsuccessful outreach Outreach attempt to the listed at the preferred outreach number in EPIC  No answer. [facility] RN CM left HIPAA [facility] Portability and Accountability Act) compliant voicemail message along with CM's contact info.    02/28/22 Unsuccessful attempt to reach patient for a conference call while speaking with Colletta Maryland at [facility] wound care center to reschedule the cancelled 02/21/22 appointment  Message left for pt to  inform him of the attempted plan of care of today and requesting a return call to RN CM Outreach  to [place] at [place] [facility] [phone] To discussed pt care concerns. She reports the patient has been in their care for a long time and the pt's aide was provided a video of wound care but is not comfortable with during the care. Janett Billow also confirms she informed staff prior to pt hospital discharge that [facility] would not be able to assist with the wound care  03/01/22 RN CM outreach to [facility] at [phone] fax [phone] Initial message left at the referral prompt #1 Requesting a return call to RN CM Left RN CM outreach office number  RN CM routed pt face sheet/demo/insurance + discharge summary (containing wound care) to [facility]  RN CM outreach to [place] home health [phone] spoke with [name] fax [phone] provided to send another referral if needed [name] confirmed the 01/19/22 referral was declined   Outreach to patient successfully RN CM updated pt on care coordination calls made 02/24/22, 02/28/22 and 03/01/22   As RN CM was attempting to get clarity on how the wound care clinic appointment was cancelled, Mr Swiderski' increased his pace and tone of speech as he began to verbalize his frustrations. He confirms he cancelled the wound care center appointment as he mistakenly thought the transportation for it would be via united healthcare services. RN CM had reviewed with him several times and it was on his discharge instructions that this transport was via the local medicaid transportation services (this is also why this RN CM made sure he wrote down the medicaid transport number on 02/17/22)  Allowed him to ventilate his feelings He repeatedly discussed that his present medical issues are related to "what they did to me" ,"They lied to me" Empathized with Mr Thorington  Mr Caylor was given [facility] patient experience number [phone] and [facility] number [phone]  He was assisted by RN CM completing an e-mail, cc him on the e-mail to [facility] requesting his medical records for his [place] wound care center care from 2018 to 2023 to include pictures  Reminded pt of the importance of attending and getting transportation to his new pcp appointment on 03/10/22 0930 ( he wrote this down again) and [facility]  Discussed the wound care clinic office policy related to dismissing patients for 2 no shows or missed appointments.  03/02/22 & 03/08/22 RN CM called medicaid transportation [phone] spoke with Lenora to schedule transport for 03/10/22 & 03/21/22 appointments with requests not to use [facility] services per pt preference.  Butch Penny confirmed on 03/08/22 transports scheduled with [facility] for each appointment and pt will get calls on 03/09/22 & 03/20/22 03/08/22 Follow call to patient completed Assessed for changes updated on care coordination for appointment transportation and continued search for in network home health wound care agencies via  [facility] care [facility]) online  site [email or web address] from his insurance card No success in finding agencies Discussed with pt the need for him and RN CM to outreach to united healthcare via telephone to attempt to receive possible outliner assistance if possible  Encouraged pt to call RN CM if he does not receive a call on 03/09/22 about his transport to his appointment for 03/10/22 03/15/22 Conference to united healthcare customer service with patient, spoke with Learta Codding to  discuss difficulty with finding home health service for wound care for patient. List of [place] in network agencies provided included a lot agencies referrals have already been sent to. Learta Codding reports noting this, to discuss with her supervisor and outreach to pt and RN CM prn. Pt was able to get clarity that [facility] Starcher and [facility] are not in network. Pt sent a list of in network pcps & dentists. RN CM sent a list of in network [facility] agencies. RN CM to send referral to the agencies that have never received one     Hypertension Interventions:  (Status:  Goal on track:  Yes.) Short Term Goal Update low BPs see below Last practice recorded BP readings:  BP Readings from Last 3 Encounters:  03/10/22 101/66  02/12/22 (!) 112/51  12/30/21 (!) 112/51  Most recent eGFR/CrCl:  Lab Results  Component Value Date   EGFR 39 (L) 12/30/2021    No components found for: "CRCL"  Discussed plans with patient for ongoing care management follow up and provided patient with direct contact information for care management team Discussed complications of poorly controlled blood pressure such as heart disease, stroke, circulatory complications, vision complications, kidney impairment, sexual dysfunction Screening for signs and symptoms of depression related to chronic disease state  Assessed social determinant of health barriers  Pain Interventions:  (Status:  Goal on track:  Yes.) Long Term Goal 03/17/22 manageable 03/08/22 managing with home medicine regimen Pain assessment  performed Medications reviewed Reviewed provider established plan for pain management Discussed importance of adherence to all scheduled medical appointments Counseled on the importance of reporting any/all new or changed pain symptoms or management strategies to pain management provider Screening for signs and symptoms of depression related to chronic disease state  Assessed social determinant of health barriers  Patient Goals/Self-Care Activities: Take all medications as prescribed Attend all scheduled provider appointments Attend church or other social activities Perform all self care activities independently  Call provider office for new concerns or questions  Go to Methadone clinic appointments Go to wound care management appointments Keep wound a clean as he can Call with transportation concerns 03/17/22 Established care with new pcp Called RN CM when noted conflict in appointments for next week.  Follow Up Plan:  The patient has been provided with contact information for the care management team and has been advised to call with any health related questions or concerns.  The care management team will reach out to the patient again over the next 30 business days.       Plan: The patient has been provided with contact information for the care management team and has been advised to call with any health related questions or concerns.  The care management team will reach out to the patient again over the next 30+ business days.  Dreden Rivere [name], RN, BSN, [place] Coordinator Office number [phone] [facility] number [phone] Fax number [phone]

## 2022-03-17 ENCOUNTER — Other Ambulatory Visit: Payer: Self-pay | Admitting: *Deleted

## 2022-03-17 NOTE — Patient Outreach (Signed)
[facility]) Care Management Telephonic RN Care Manager Note   03/17/2022 Name:  [name] MRN:  [id number] DOB:  1965/10/05  Summary: Message from pt requesting a return call, Pt with conflict in schedule for Tuesday 03/21/22 methadone clinic and initial wound clinic visit  He goes to the [address] [place] [address] [name] -[place] [phone]   Recommendations/Changes made from today's visit: Conference outreach with pt to [phone] No answer Left a message for a return call to pt and/or RN CM about his schedule conflict for methadone clinic (prior to 11 am arrival) and wound care clinic at 1015 arrival  [facility] to [name] wound care [name] [phone] left a message to attempt to confirm 03/21/22 Dr [name] wound care clinic [place] outreach to [name] transportation [phone] to schedule a transport for pt to a Wednesday 03/22/22 methadone clinic visit -[place], [place], [place] [address] [phone] Spoke with [name] to schedule this transportation at 1302 (No with [name])  Sent via EPIC routing home health referrals (including demo/insurance, face to face order, and discharge summary) to [place] home care fax [phone]  [facility] fax [place] fax [phone] [facility] of [place] fax [phone] Home health professionals of [place] fax [phone] [facility] [phone]   Updated pt on the above home health referrals  He called to complain that [name] wanted to use [name] for 03/22/22 He prefers to go on the bus He does not want to take the risk of using the united healthcare benefit and miss his appointment with an inappropriate transport as in past  Gave him the number and completed a conference with him to [phone] [place] medicaid ombudsman services Spoke with  [place]. [name] was able to ventilate his feelings about his experiences with [name] transportation Escalated to the state to see if there is a resolution for this concern She will text him with updates  Subjective: [name] is an 56 y.o. year old male who is a primary patient of [name] Perna, NP. The care management team was consulted for assistance with care management and/or care coordination needs.    Telephonic RN Care Manager completed Telephone Visit today.   Objective:  Medications Reviewed Today     Reviewed by [name] Faster, RN (Registered Nurse) on 03/17/22 at 1149  Med List Status: <None>   Medication Order Taking? Sig Documenting Provider Last Dose Status Informant  acetaminophen (TYLENOL) 500 MG tablet [id number] No Take 2 tablets (1,000 mg total) by mouth 3 (three) times daily as needed for mild pain or moderate pain (or Fever >/= 101). [name] Duff, MD Taking Active   allopurinol (ZYLOPRIM) 100 MG tablet [id number] No Take 1 tablet (100 mg total) by mouth daily. [name], MD Taking Active Self  ascorbic acid (VITAMIN C) 500 MG tablet [id number] No Take 1 tablet (500 mg total) by mouth 2 (two) times daily. [name] Duff, MD Taking Active   aspirin EC 81 MG tablet [id number] No Take 81 mg by mouth every morning. Swallow whole. [provider] Taking Active Self  benzocaine (ANBESOL) 10 % mucosal gel [id number] No Use as directed 1 application. in the mouth or throat daily as needed for mouth  pain. [provider] Taking Active Self  Blood Pressure Monitor DEVI [id number] No Please provide patient with insurance approved blood pressure monitor. ICD10 I.10 Gildardo Pounds, NP Taking Active Self  Calcium Carbonate-Vitamin [name] (CALCIUM-[name] PO) [id number] No Take 1 tablet by mouth every morning. [provider] Taking Active Self  Chlorhexidine Gluconate Cloth 2 % PADS [id number] No Apply 6 each topically  daily. [name] Duff, MD Taking Active   clotrimazole (LOTRIMIN) 1 % cream [id number] No Apply topically 2 (two) times daily.  Patient taking differently: Apply 1 application  topically 2 (two) times daily.   Mitzi Hansen, MD Taking Active Self  diclofenac Sodium (VOLTAREN) 1 % GEL [id number] No Apply 2 g topically 3 (three) times daily. [provider] Taking Active Self  famotidine (PEPCID) 20 MG tablet [id number] No Take 1 tablet (20 mg total) by mouth daily. [name], MD Taking Active Self  famotidine (PEPCID) 40 MG tablet [id number] No SMARTSIG:1 Tablet(s) By Mouth Morning-Night [provider] Taking Active   feeding supplement (ENSURE ENLIVE / ENSURE PLUS) LIQD [id number] No Take 237 mLs by mouth 2 (two) times daily between meals. [name] Duff, MD Taking Active   ferrous sulfate 325 (65 FE) MG tablet [id number] No Take 1 tablet (325 mg total) by mouth daily with breakfast. [name] Duff, MD Taking Active   ibuprofen (ADVIL) 400 MG tablet [id number] No Take 1 tablet (400 mg total) by mouth every 8 (eight) hours as needed. [name] Duff, MD Taking Active   liver oil-zinc oxide (DESITIN) 40 % ointment [id number] No Apply 1 application topically daily. [provider] Taking Active Self  lubiprostone (AMITIZA) 24 MCG capsule [id number] No TAKE 1 CAPSULE (24 MCG TOTAL) BY MOUTH 2 (TWO) TIMES DAILY WITH A MEAL. Zehr, Laban Emperor, PA-C Taking Active   magnesium hydroxide (MILK OF MAGNESIA) 400 MG/5ML suspension [id number] No Take 30 mLs by mouth daily. [name] Duff, MD Taking Active   methadone (DOLOPHINE) 10 MG/5ML solution [id number] No Take 50 mg by mouth daily. [provider] Taking Active Multiple Informants           Med Note [place] Crane, MISTY [name]   Mon Jan 16, 2022  5:43 AM) Per Verdie Drown at [facility] pt last doses at 50 mg qd on 01/10/22 with 1 week of take home doses  Multiple Vitamin (MULTIVITAMIN WITH MINERALS) TABS tablet [id number] No  Take 1 tablet by mouth daily. [name] Duff, MD Taking Active   nutrition supplement, Leanord Asal) PACK [id number] No Take 1 packet by mouth 2 (two) times daily between meals. [name] Duff, MD Taking Active   OVER THE COUNTER MEDICATION [id number] No Take 3 capsules by mouth every morning. Omega XL [provider] Taking Active Self  Oxymetazoline HCl (NASAL SPRAY NA) [id number] No Place 1 spray into both nostrils daily as needed (congestion). Family care nasal spray [provider] Taking Active Self  polyethylene glycol (MIRALAX / GLYCOLAX) 17 g packet [id number] No Take 17 g by mouth 2 (two) times daily. [name] Duff, MD Taking Active   predniSONE (DELTASONE) 20 MG tablet [id number] No Take 40 mg by mouth every morning. [provider] Taking Active   saccharomyces boulardii (FLORASTOR) 250 MG capsule [id number] No Take 250 mg by mouth daily. [provider] Taking Active Self  senna-docusate (SENOKOT-S) 8.6-50 MG tablet [id number] No Take 2 tablets by mouth 2 (two) times daily. [name] Duff, MD Taking Active   Med List Note Acie Fredrickson, Dionne Bucy, CPhT 12/20/21 1200): [place] [facility]  [facility] [phone] Open 5:30 AM-2 PM Monday-Friday, 5:30 AM- 7:30 AM Saturday, closed on Sunday             SDOH:  (Social Determinants of Health) assessments and interventions performed:    Care Plan  Review of patient past medical history, allergies, medications, health status, including review of consultants reports, laboratory and other test data, was performed as part of comprehensive evaluation for care management services.   Care Plan : RN Care Manager Plan of Care  Updates made by [name] Faster, RN since 03/17/2022 12:00 AM     Problem: Complex Care Coordination Needs and disease management in patient with HTN, transportation, appointments   Priority: High  Onset Date: 02/17/2022     Long-Range Goal: Establish Plan of Care for  Management Complex SDOH Barriers, disease management and Care Coordination Needs in patient with HTN, transportation, appointments   Start Date: 02/17/2022  Recent Progress: Not on track  Priority: High  Note:   Current Barriers:  Knowledge Deficits related to plan of care for management of HTN and chronic pain, social determinant of health needs  Care Coordination needs related to Transportation and Lacks knowledge of community resource: transportation Transportation barriers Barriers: compliance/engagement- missing/no show/cancelling medical appointments, conflicting transportation resources that no show or does not provide appropriate options to get to appointments [facility] & [name]/Medicaid) leading to missed appointments, limited local wound care home health service staff in network Unsuccessful outreaches 02/24/22, 02/28/22  Pt cancelled appointments 02/[place] /pt 02/18/22- rescheduled to 03/21/22 0930 Urology procedure for 03/03/22 cancelled by pt  RN CM Clinical Goal(s):  Patient will verbalize understanding of plan for management of HTN and chronic pain plus social determinant needs as evidenced by completion of follow up appointments, decrease re admission risks  through collaboration with RN Care manager, provider, and care team.   Interventions: Outreaches for care coordination, disease management, resources, home care/education needs Inter-disciplinary care team collaboration (see longitudinal plan of care) Evaluation of current treatment plan related to  self management and patient's adherence to plan as established by provider   Interdisciplinary Collaboration Interventions:  (Status:  continuing to work on improving resources ) Short Term Goal   Collaborated with RN CM to initiate plan of care to address needs related to Transportation in patient with HTN and chronic pain, substance use Collaboration with [name] Perna, NP , various home health offices, [facility]  home care [place]), [place] [facility] & [place] [facility] (stephanie-[phone]), medicaid transportation , united healthcare transportation Inter-disciplinary care team collaboration 02/17/22 Outreach with patient to [place] for attempt to get assist for transportation for 02/20/22 methadone appointment Spoke with April at [phone] medicaid transportation Confirmed he has a transportation booking for 02/21/22 but is not able to get assist for 02/20/22 related to needing to call in 3 days prior to appointment April shared with pt that his local [name] medicaid case worker is Rosanna [name]  [phone] RN CM reviewed with him the numbers for his [name] case worker, [name] transportation and united healthcare transportation plus waited for him to write the information down. RN CM also encouraged him to share this with [name]  Outreach with patient to  [facility] care [facility]) [phone] spoke with Ravin to assist with transportation for 02/20/22 0700 and 02/21/22 0600 to methadone clinic [name] [address] [place], [place], [place] [address]  [phone]  02/20/22 Confirm #[id number] pick up 0618, pt to call #[phone] 02/21/22 pick up 0511 confirm #[id number], 0630 #[id number]  RN CM outreached to [place] [phone] after discussing with patient about he missed 11/11/21 appointment during his hospitalization. He gave permission for RN CM to outreach to see if another visit could be scheduled  He was offered the earliest appointment for hospital follow up as Friday June 9 at 0930 with Juluis Mire   Unsuccessful attempts x 2 to [facility] health source [place] [phone]  02/20/22 -Returned pt outreach and Allowed him to ventilate his feelings Encouragement provided Updated him on his upcoming 03/10/22 new pcp appointment with Juluis Mire 03/10/22 0930 that RN CM assisted with  scheduling on 02/17/22 Discussed with him this RN CM attempt to reach staff at [facility] on 02/17/22 without success Provided him with his my chart user name and discussed providing education on its use in future 02/22/22 Completed follow up outreach  Allow him to ventilate again  Assessed possible other home health agencies he has had services with before  Attempt to call [phone] [name] Jewett [facility] to cancel the anterior urethroplasty, possible buccal mucosa graft Provided the patient the number to attempt to call Dr Francesca Jewett on 02/23/22 to cancel this appointment Review of EPIC to note on 4/20-24/23 In patient RN CM called these agencies who declined the patient for services [facility], [place] due to patient was active with their services before but was non compliant with treatment, disrespectful to staff and chronic wound will not be covered by insurance. [place] patient due to staffing. [facility]- Declined due to being at capacity for [name] referrals at this time. [facility]- No RN available for wound care in patient's area of residence. [facility]- No response [facility]- Not able to accept at this time  02/24/22 RN CM outreached to the following home health agencies that referrals were sent to prior to patient 02/12/22 discharge to check on status update of referrals  [name] [phone] left voice message- previous staffing issues- [name] return a call and confirms no available staff to do extensive wound care  [facility]- [phone] [name] confirms they are still at capacity for [place] at [name] wound care [name] confirmed the dr at the wound care cancelled the patient's Feb 14 2022 wound care visit Consulted [facility] leadership  Attempted to reach staff at [name] point wound care clinic [phone] to discuss recent visit & frequency, type of dressing changes for pt sacral wound Left a message requesting a return call to RN  Cm   RN CM checked pt's list of appointments in [name] and it does appear his June 2023 urology surgery has been cancelled   Attempted again to reach staff at [facility] point wound care clinic [phone] to discuss recent visit & frequency, type of dressing changes for pt sacral wound Left a message requesting a return call to RN Cm   Outreach to patient to update him [phone]  [facility] Unsuccessful outreach Outreach attempt to the listed at the preferred outreach number in EPIC  No answer. [facility] RN CM left HIPAA [name] Portability and Accountability Act) compliant voicemail message along with CM's contact info.    02/28/22 Unsuccessful attempt to reach patient for a conference call while speaking with Colletta Maryland at [name] wound care [name] to reschedule the cancelled 02/21/22 appointment  Message  left for pt to inform him of the attempted plan of care of today and requesting a return call to RN CM Outreach  to [place] [facility] [place] [facility] [phone] To discussed pt care concerns. She reports the patient has been in their care for a long time and the pt's aide was provided a video of wound care but is not comfortable with during the care. Janett Billow also confirms she informed staff prior to pt hospital discharge that [facility] would not be able to assist with the wound care  03/01/22 RN CM outreach to [facility] at [phone] fax [phone] Initial message left at the referral prompt #1 Requesting a return call to RN CM Left RN CM outreach office number  RN CM routed pt face sheet/demo/insurance + discharge summary (containing wound care) to [facility]  RN CM outreach to [place] home health [phone] spoke with [name] fax [phone] provided to send another referral if needed [name] confirmed the 01/19/22 referral was declined   Outreach to patient successfully RN CM updated pt on care coordination calls made 02/24/22, 02/28/22 and 03/01/22  As RN CM was  attempting to get clarity on how the wound care clinic appointment was cancelled, [name] Jaggers' increased his pace and tone of speech as he began to verbalize his frustrations. He confirms he cancelled the wound care [name] appointment as he mistakenly thought the transportation for it would be via united healthcare services. RN CM had reviewed with him several times and it was on his discharge instructions that this transport was via the local medicaid transportation services (this is also why this RN CM made sure he wrote down the medicaid transport number on 02/17/22)  Allowed him to ventilate his feelings He repeatedly discussed that his present medical issues are related to "what they did to me" ,"They lied to me" Empathized with [name] Treanor  [name] Marhefka was given [place]'s patient experience number [phone] and [facility] dept/HIM number [phone]  He was assisted by RN CM completing an e-mail, cc him on the e-mail to [facility] requesting his medical records for his [place] wound care [name] care from 2018 to 2023 to include pictures  Reminded pt of the importance of attending and getting transportation to his new pcp appointment on 03/10/22 0930 ( he wrote this down again) and [place]  Discussed the wound care clinic office policy related to dismissing patients for 2 no shows or missed appointments.  03/02/22 & 03/08/22 RN CM called medicaid transportation [phone] spoke with Lenora to schedule transport for 03/10/22 & 03/21/22 appointments with requests not to use [facility] services per pt preference.  Butch Penny confirmed on 03/08/22 transports scheduled with [facility] for each appointment and pt will get calls on 03/09/22 & 03/20/22 03/08/22 Follow call to patient completed Assessed for changes updated on care coordination for appointment transportation and continued search for in network home health wound care agencies via  [facility] care [name]) online site  [email or web address] from his insurance card No success in finding agencies Discussed with pt the need for him and RN CM to outreach to united healthcare via telephone to attempt to receive possible outliner assistance if possible  Encouraged pt to call RN CM if he does not receive a call on 03/09/22 about his transport to his appointment for 03/10/22 03/17/22  Assisted with outreaches to methadone clinic, [name]  for 03/22/22 transportation needed to methadone clinic, Assisted with conference call to [place] [facility] ombudsman staff, Lanelle Bal to review his concerns with [place] transportation. Pt informed today he would have to use [name] vs preferred [facility] for 03/22/22   Sent via EPIC routing home health referrals (including demo/insurance, face to face order, and discharge summary) to  [name] home care fax [phone]  [facility] fax [place] fax [phone] [facility] of [place] health fax [phone] Home health professionals of [place] fax [phone] [facility] [phone]   Hypertension Interventions:  (Status:  Goal on track:  Yes.) Short Term Goal Update low BPs see below Last practice recorded BP readings:  BP Readings from Last 3 Encounters:  03/10/22 101/66  02/12/22 (!) 112/51  12/30/21 (!) 112/51  Most recent eGFR/CrCl:  Lab Results  Component Value Date   EGFR 39 (L) 12/30/2021    No components found for: "CRCL"  Discussed plans with patient for ongoing care management follow up and provided patient with direct contact information for care management team Discussed complications of poorly controlled blood pressure such as heart disease, stroke, circulatory complications, vision complications, kidney impairment, sexual dysfunction Screening for signs and symptoms of depression related to chronic disease state  Assessed social determinant of health barriers  Pain Interventions:   (Status:  Goal on track:  Yes.) Long Term Goal 03/17/22 manageable 03/08/22 managing with home medicine regimen Pain assessment performed Medications reviewed Reviewed provider established plan for pain management Discussed importance of adherence to all scheduled medical appointments Counseled on the importance of reporting any/all new or changed pain symptoms or management strategies to pain management provider Screening for signs and symptoms of depression related to chronic disease state  Assessed social determinant of health barriers  Patient Goals/Self-Care Activities: Take all medications as prescribed Attend all scheduled provider appointments Attend church or other social activities Perform all self care activities independently  Call provider office for new concerns or questions  Go to Methadone clinic appointments Go to wound care management appointments Keep wound a clean as he can Call with transportation concerns 03/17/22 Established care with new pcp Called RN CM when noted conflict in appointments for next week.  Follow Up Plan:  The patient has been provided with contact information for the care management team and has been advised to call with any health related questions or concerns.  The care management team will reach out to the patient again over the next 30 business days.       Plan: The patient has been provided with contact information for the care management team and has been advised to call with any health related questions or concerns.  The care management team will reach out to the patient again over the next 30+ business days.  Lena Gores [name], RN, BSN, [place] Coordinator Office number [phone] [name] number [phone] Fax number [phone]

## 2022-03-17 NOTE — Patient Instructions (Addendum)
[name] The number for the [facility] ombudsman service that we called today is [phone] Please follow up with [place] wound care home health referral information was fax via EPIC to  [place] senior [facility] home health of [place] health professionals of [place]  [facility] home [facility]   Please accept outreaches from these providers if they call you!

## 2022-03-21 ENCOUNTER — Other Ambulatory Visit: Payer: Self-pay | Admitting: *Deleted

## 2022-03-21 NOTE — Patient Outreach (Signed)
[place] [facility]) Care Management  03/21/2022  [name] 07/24/1966 [id number]   [place] coordination- PTAR trasnsport  RN CM spoke with [name] at [facility] billing office [phone] about the process for obtaining PTAR for transport to wound care center. Prior authorization is needed from [place] RN CM will follow up with patient within the next 30+ business days   [name] Schwarz [name], RN, BSN, [place] Coordinator Office number [phone]

## 2022-03-21 NOTE — Patient Outreach (Signed)
[facility]) Care Management  03/21/2022  [name] 09-Mar-1966 [id number]   [place] coordination- call from pt/home health wound nurse/rescheduling wound clinic visit info  In coming call from [name] updating RN CM that he receive a call from [facility] prior to calling RN CM informing him that they would visit to do wound care for him He also provided the contact numbers for PTAR [phone] and [place] [phone] offered to him today His personal care aide remains with him at the time of the call [name] voices his appreciation of services rendered He is thankful for an outreach from [place] RN CM will follow up with patient within the next 30 + business days  [name] [name], RN, BSN, [place] Coordinator Office number [phone]

## 2022-03-21 NOTE — Patient Outreach (Signed)
[facility]) Care Management Telephonic RN Care Manager Note   03/21/2022 Name:  [name] MRN:  [id number] DOB:  1966-04-30  Summary: [name] left a voice message for RN CM while she was speak with another patient He requested a return call He reported a "rough time with trying to get on the stretcher and got to return" [name] called RN CM to update RN that he did make it the [facility] with [facility] today but his wound was not viewed today as the wound care center staff was not able to get him on the stretcher His personal care aide was with him The wound care center Ubaldo Glassing) wants patient to return back with [facility] on a stretcher after arranging services. They will give him a return date after the PTAR services is arranged   Recommendations/Changes made from today's visit: Returned call to patient as requested Reviewed his concerns with him and encouragement provided   Subjective: [name] is an 57 y.o. year old male who is a primary patient of [name], NP. The care management team was consulted for assistance with care management and/or care coordination needs.    Telephonic RN Care Manager completed Telephone Visit today.   Objective:  Medications Reviewed Today     Reviewed by Barbaraann Faster, RN (Registered Nurse) on 03/17/22 at 1149  Med List Status: <None>   Medication Order Taking? Sig Documenting Provider Last Dose Status Informant  acetaminophen (TYLENOL) 500 MG tablet [id number] No Take 2 tablets (1,000 mg total) by mouth 3 (three) times daily as needed for mild pain or moderate pain (or Fever >/= 101). [name] Duff, MD Taking Active   allopurinol (ZYLOPRIM) 100 MG tablet [id number] No Take 1 tablet (100 mg total) by mouth daily. [name], MD Taking Active Self  ascorbic acid (VITAMIN C) 500 MG tablet [id number] No Take 1 tablet (500 mg total) by mouth 2 (two) times daily. [name] Duff, MD Taking Active   aspirin EC 81 MG tablet [id number] No Take 81 mg by mouth every morning. Swallow whole. [provider] Taking Active Self  benzocaine (ANBESOL) 10 % mucosal gel [id number] No Use as directed 1 application. in the mouth or throat daily as needed for mouth pain. [provider] Taking Active Self  Blood Pressure Monitor DEVI [id number] No Please provide patient with insurance approved blood pressure monitor. ICD10 I.10 Gildardo Pounds, NP Taking Active Self  Calcium Carbonate-Vitamin D (CALCIUM-D PO) [id number] No Take 1 tablet by mouth every morning. [provider] Taking Active Self  Chlorhexidine Gluconate Cloth 2 % PADS [id number] No Apply 6 each topically daily. [name] Duff, MD Taking Active   clotrimazole (LOTRIMIN) 1 % cream [id number] No Apply topically 2 (two) times daily.  Patient taking differently: Apply 1 application  topically 2 (two) times daily.   Mitzi Hansen, MD Taking Active Self  diclofenac Sodium (VOLTAREN) 1 % GEL [id number] No Apply 2 g topically 3 (three) times daily. [provider] Taking Active Self  famotidine (PEPCID) 20 MG tablet [id number] No Take 1 tablet (20 mg total) by mouth daily. [name], MD Taking Active Self  famotidine (PEPCID) 40 MG tablet [id number] No SMARTSIG:1 Tablet(s) By Mouth Morning-Night [provider] Taking Active   feeding supplement (ENSURE ENLIVE / ENSURE PLUS) LIQD [id number] No Take 237 mLs by mouth 2 (two) times daily between meals. [name] Duff, MD Taking Active   ferrous sulfate 325 (65 FE) MG  tablet [id number] No Take 1 tablet (325 mg total) by mouth daily with breakfast. [name] Duff, MD Taking Active   ibuprofen (ADVIL) 400 MG tablet [id number] No Take 1 tablet (400 mg total) by mouth every 8 (eight) hours as needed. [name] Duff, MD Taking Active   liver oil-zinc oxide (DESITIN) 40 % ointment [id number] No Apply 1 application topically  daily. [provider] Taking Active Self  lubiprostone (AMITIZA) 24 MCG capsule [id number] No TAKE 1 CAPSULE (24 MCG TOTAL) BY MOUTH 2 (TWO) TIMES DAILY WITH A MEAL. Zehr, Laban Emperor, PA-C Taking Active   magnesium hydroxide (MILK OF MAGNESIA) 400 MG/5ML suspension [id number] No Take 30 mLs by mouth daily. [name] Duff, MD Taking Active   methadone (DOLOPHINE) 10 MG/5ML solution [id number] No Take 50 mg by mouth daily. [provider] Taking Active Multiple Informants           Med Note [place] Crane, MISTY D   Mon Jan 16, 2022  5:43 AM) Per Verdie Drown at [facility] pt last doses at 50 mg qd on 01/10/22 with 1 week of take home doses  Multiple Vitamin (MULTIVITAMIN WITH MINERALS) TABS tablet [id number] No Take 1 tablet by mouth daily. [name] Duff, MD Taking Active   nutrition supplement, Leanord Asal) PACK [id number] No Take 1 packet by mouth 2 (two) times daily between meals. [name] Duff, MD Taking Active   OVER THE COUNTER MEDICATION [id number] No Take 3 capsules by mouth every morning. Omega XL [provider] Taking Active Self  Oxymetazoline HCl (NASAL SPRAY NA) [id number] No Place 1 spray into both nostrils daily as needed (congestion). Family care nasal spray [provider] Taking Active Self  polyethylene glycol (MIRALAX / GLYCOLAX) 17 g packet [id number] No Take 17 g by mouth 2 (two) times daily. [name] Duff, MD Taking Active   predniSONE (DELTASONE) 20 MG tablet [id number] No Take 40 mg by mouth every morning. [provider] Taking Active   saccharomyces boulardii (FLORASTOR) 250 MG capsule [id number] No Take 250 mg by mouth daily. [provider] Taking Active Self  senna-docusate (SENOKOT-S) 8.6-50 MG tablet [id number] No Take 2 tablets by mouth 2 (two) times daily. [name] Duff, MD Taking Active   Med List Note Acie Fredrickson, Dionne Bucy, CPhT 12/20/21 1200): [place] [phone] Open 5:30 AM-2 PM  Monday-Friday, 5:30 AM- 7:30 AM Saturday, closed on Sunday             SDOH:  (Social Determinants of Health) assessments and interventions performed:    Care Plan  Review of patient past medical history, allergies, medications, health status, including review of consultants reports, laboratory and other test data, was performed as part of comprehensive evaluation for care management services.   There are no care plans that you recently modified to display for this patient.    Plan: The patient has been provided with contact information for the care management team and has been advised to call with any health related questions or concerns.  The care management team will reach out to the patient again over the next 30+ business days.  Treana Lacour [name], RN, BSN, [place] Coordinator Office number [phone] Main [facility] number [phone] Fax number [phone]

## 2022-03-22 ENCOUNTER — Other Ambulatory Visit: Payer: Self-pay | Admitting: *Deleted

## 2022-03-22 NOTE — Patient Outreach (Unsigned)
[facility]) Care Management Telephonic RN Care Manager Note   03/22/2022 Name:  [name] MRN:  [id number] DOB:  1966/04/17  Summary: Follow up with patient, follow up with [facility], outreach to [facility] & [employer] transportation + for attempt to get assistance with home health services with [employer]   Recommendations/Changes made from today's visit: Outreach to [employer] spoke with Crystal [phone] who confirmed pt with called on 03/21/22 but the message was to inform him that [facility] could not admit him as a patient [place] to [facility] spoke with [place] [phone] to inquire about the frequency of visit needed for [facility] transport She confirms once  Updated pt on the outreach to [employer] with pt to [phone] united healthcare customer service for pre authorization for [facility] and home health Spoke with [name] to  Call back to [facility] Thursday March 30 2022 915 am Dr Welton Flakes [address] [place] [address] [facility] [place]) ID # [id number], [id number]  Conference with pt to [phone] an incorrect number for assistance with home health Conference with pt to [phone] again spoke with [name] about the various home health agencies not being able to provide wound care services to the patient with recent referrals.  [name] to outreach to a home health agency on the list. [name] reached [facility] whom he states would provided care to pt. RN CM informed [name] returned a call to [facility] today 03/22/22 to state they would not be able to take [name] due to insurance.  RN CM inquired about a possible peer to Hydro[employer] surveyor for assistance to find a home health care agency. RN CM repeatedly explained to [name] that the in network list was emailed, referrals for services was sent to all of the agencies on the list by from April 2023 to the present date without an  accepting agency. Pt and RN CM placed on various holds. Pt and RN CM disconnected after an extensive wait time to attempt to get a provider  Subjective: [name] is an 56 y.o. year old male who is a primary patient of [name], NP. The care management team was consulted for assistance with care management and/or care coordination needs.    Telephonic RN Care Manager completed Telephone Visit today.   Objective:  Medications Reviewed Today     Reviewed by Barbaraann Faster, RN (Registered Nurse) on 03/17/22 at 1149  Med List Status: <None>   Medication Order Taking? Sig Documenting Provider Last Dose Status Informant  acetaminophen (TYLENOL) 500 MG tablet [id number] No Take 2 tablets (1,000 mg total) by mouth 3 (three) times daily as needed for mild pain or moderate pain (or Fever >/= 101). [name] Duff, MD Taking Active   allopurinol (ZYLOPRIM) 100 MG tablet [id number] No Take 1 tablet (100 mg total) by mouth daily. [name], MD Taking Active Self  ascorbic acid (VITAMIN C) 500 MG tablet [id number] No Take 1 tablet (500 mg total) by mouth 2 (two) times daily. [name] Duff, MD Taking Active   aspirin EC 81 MG tablet [id number] No Take 81 mg by mouth every morning. Swallow whole. [provider] Taking Active Self  benzocaine (ANBESOL) 10 % mucosal gel [id number] No Use as directed 1 application. in the mouth or throat daily as needed for mouth pain. [provider] Taking Active Self  Blood  Pressure Monitor DEVI [id number] No Please provide patient with insurance approved blood pressure monitor. ICD10 I.10 Gildardo Pounds, NP Taking Active Self  Calcium Carbonate-Vitamin D (CALCIUM-D PO) [id number] No Take 1 tablet by mouth every morning. [provider] Taking Active Self  Chlorhexidine Gluconate Cloth 2 % PADS [id number] No Apply 6 each topically daily. [name] Duff, MD Taking Active   clotrimazole (LOTRIMIN) 1 % cream [id number] No  Apply topically 2 (two) times daily.  Patient taking differently: Apply 1 application  topically 2 (two) times daily.   Mitzi Hansen, MD Taking Active Self  diclofenac Sodium (VOLTAREN) 1 % GEL [id number] No Apply 2 g topically 3 (three) times daily. [provider] Taking Active Self  famotidine (PEPCID) 20 MG tablet [id number] No Take 1 tablet (20 mg total) by mouth daily. [name], MD Taking Active Self  famotidine (PEPCID) 40 MG tablet [id number] No SMARTSIG:1 Tablet(s) By Mouth Morning-Night [provider] Taking Active   feeding supplement (ENSURE ENLIVE / ENSURE PLUS) LIQD [id number] No Take 237 mLs by mouth 2 (two) times daily between meals. [name] Duff, MD Taking Active   ferrous sulfate 325 (65 FE) MG tablet [id number] No Take 1 tablet (325 mg total) by mouth daily with breakfast. [name] Duff, MD Taking Active   ibuprofen (ADVIL) 400 MG tablet [id number] No Take 1 tablet (400 mg total) by mouth every 8 (eight) hours as needed. [name] Duff, MD Taking Active   liver oil-zinc oxide (DESITIN) 40 % ointment [id number] No Apply 1 application topically daily. [provider] Taking Active Self  lubiprostone (AMITIZA) 24 MCG capsule [id number] No TAKE 1 CAPSULE (24 MCG TOTAL) BY MOUTH 2 (TWO) TIMES DAILY WITH A MEAL. Zehr, Laban Emperor, PA-C Taking Active   magnesium hydroxide (MILK OF MAGNESIA) 400 MG/5ML suspension [id number] No Take 30 mLs by mouth daily. [name] Duff, MD Taking Active   methadone (DOLOPHINE) 10 MG/5ML solution [id number] No Take 50 mg by mouth daily. [provider] Taking Active Multiple Informants           Med Note [name], MISTY D   Mon Jan 16, 2022  5:43 AM) Per [name] at [facility] pt last doses at 50 mg qd on 01/10/22 with 1 week of take home doses  Multiple Vitamin (MULTIVITAMIN WITH MINERALS) TABS tablet [id number] No Take 1 tablet by mouth daily. [name] Duff, MD Taking Active   nutrition supplement,  Leanord Asal) PACK [id number] No Take 1 packet by mouth 2 (two) times daily between meals. [name] Duff, MD Taking Active   OVER THE COUNTER MEDICATION [id number] No Take 3 capsules by mouth every morning. Omega XL [provider] Taking Active Self  Oxymetazoline HCl (NASAL SPRAY NA) [id number] No Place 1 spray into both nostrils daily as needed (congestion). Family care nasal spray [provider] Taking Active Self  polyethylene glycol (MIRALAX / GLYCOLAX) 17 g packet [id number] No Take 17 g by mouth 2 (two) times daily. [name] Duff, MD Taking Active   predniSONE (DELTASONE) 20 MG tablet [id number] No Take 40 mg by mouth every morning. [provider] Taking Active   saccharomyces boulardii (FLORASTOR) 250 MG capsule [id number] No Take 250 mg by mouth daily. [provider] Taking Active Self  senna-docusate (SENOKOT-S) 8.6-50 MG tablet [id number] No Take 2 tablets by mouth 2 (two) times daily. [name] Duff, MD Taking Active   Med List Note Acie Fredrickson, Dionne Bucy, CPhT 12/20/21 1200): [place] Open 5:30 AM-2 PM Monday-Friday, 5:30 AM-  7:30 AM Saturday, closed on Sunday             SDOH:  (Social Determinants of Health) assessments and interventions performed:    Care Plan  Review of patient past medical history, allergies, medications, health status, including review of consultants reports, laboratory and other test data, was performed as part of comprehensive evaluation for care management services.   There are no care plans that you recently modified to display for this patient.    Plan: The patient has been provided with contact information for the care management team and has been advised to call with any health related questions or concerns.  The care management team will reach out to the patient again over the next 30+ business days.  Salam [place] [name], RN, BSN, [place] Coordinator Office number [phone] Main [facility] number [phone] Fax number [phone]

## 2022-03-22 NOTE — Patient Outreach (Signed)
[facility]) Care Management  03/22/2022  Treon Dagley 1966/01/20 [id number]   [place] coordination- home health response  RN CM updated by [facility] CMA, Mickel Baas of a response to [facility] office from [place] home health stating  Karna Christmas from [facility] called they will not be able to take Mr. Spackman due to Insurance.      Plan [facility] RN CM will follow up with patient within the next 30 business days  Jancy Sprankle [name], RN, BSN, [place] Coordinator Office number [phone]

## 2022-03-24 ENCOUNTER — Other Ambulatory Visit: Payer: Self-pay | Admitting: *Deleted

## 2022-03-24 NOTE — Patient Outreach (Signed)
[facility]) Care Management  03/24/2022  Treyon Hentges [birth year] [id number]   [facility] Care coordination- collaboration with [facility] with Tammy in Valerie's absence at [facility] [phone] to inquire about the recent transportation stretcher services called in to [employer] healthcare on 03/22/22 Tammy requests a return call to Vikki Ports  RN CM consulted [facility] SW staff Pending response [facility] Leadership was consulted on 03/22/22 about this case  Plan [facility] RN CM will follow up with patient within the next 30 business days    Khamia Stambaugh L. Noelle Penner, RN, BSN, CCM [facility] Telephonic Care Management Care Coordinator Office number [phone]

## 2022-03-27 ENCOUNTER — Other Ambulatory Visit: Payer: Self-pay | Admitting: *Deleted

## 2022-03-28 ENCOUNTER — Other Ambulatory Visit: Payer: Self-pay | Admitting: *Deleted

## 2022-03-28 NOTE — Patient Outreach (Addendum)
[facility]) Care Management  03/28/2022  Sherril Streett 1966-08-07 [id number]   [place] Care coordination- non ambulatory transportation   Mr Loudy called RN CM and left a message that [facility] [employer] called him to inform him he would not have a ride to his 03/30/22 appointment. RN CM returned a call to Mr Errington and updated him on the care coordination calls made on 03/24/22, 03/27/22, 03/28/22, the discussion wit [facility] leadership and the pending outreach from [employer]. He voiced understanding of safe ride not offering non ambulatory transportation services  Consulted with [facility] SW Judie Petit with [phone] to update her on declined transport by safe ride, to cancel the 03/30/22 appointment and request Dr Nelda Severe assistance with a letter/order for assistance with pre authorization to united healthcare for the necessity of use of a ambulance stretcher transport for Mr Wesling to the [employer] wound care center Dr Nelda Severe agreed to provide assistance  Updated the patient of appointment cancellation and Dr Nelda Severe assistance He voiced understanding    Plan [facility] RN CM will follow up with patient within the next 30 business days    Nikkie Liming L. Noelle Penner, RN, BSN, CCM [facility] Telephonic Care Management Care Coordinator Office number [phone]

## 2022-03-28 NOTE — Patient Outreach (Signed)
[facility]) Care Management  03/28/2022  [name] 09-20-1966 [id number]   [facility] Care coordination- non ambulatory transportation   Spoke with Myrene Buddy at [phone] who confirms with RN CM that [employer] does not provide stretcher transportation services to united healthcare members. She was able to review the notes from 03/22/22 & 03/27/22. Safe [employer] only provides ambulatory rides for united healthcare  Reviewed with [facility] leadership E-mail to united healthcare staff, [place]aver for possible assistance   Plan [facility] RN CM will follow up with patient within the next 30 business days  [name] Noelle Penner, RN, BSN, CCM [facility] Telephonic Care Management Care Coordinator Office number [phone]

## 2022-03-29 ENCOUNTER — Other Ambulatory Visit: Payer: Self-pay | Admitting: *Deleted

## 2022-03-29 NOTE — Patient Outreach (Signed)
[facility]) Care Management  03/29/2022  Willford Jares 1966-08-15 [id number]   [place] coordination- non emergency transport and home health services   RN CM received an e-mail from [place] [employer] administrator on behalf of T [employer] RN CM outreached to her successfully at [phone] and left a message  She returned the call.  She and RN CM reviewed the needed non emergency transportation for Mr Lortie to his wound care center  She confirms a prior authorization is not needed, he can use PTAR and [employer] will pay for it if pt meet the need criteria for non emergency transportation indicated in the policy sent via e-mail The MD needs to indicate the pt meets the criteria for nonemergency in pt chart   RN CM and Tinisha completed a conference call with PT[facility] staff to discuss the previous prior authorization request  [name] at [facility] shared with RN CM and Juliette Mangle that PTAR generally ask for these transports to be authorized for pt They discuss the codes utilized It was agreed that Dr Welton Flakes would need to complete a PCS (Physician Certification Statement) form, sign it and print his name on it for [place]. [name] was offered Dr Vivi Barrack office fax [phone] so she could fax a copy of the form to ATTN Alexis/Dr Welton Flakes  RN CM discussed Mr Gorum' and RN CM difficulties with finding an in network home health agency to provide wound care to request any possible assistance from [facility] to obtain needed services. RN CM reviewed with her that referrals were sent by the hospital staff prior to his 02/12/22 discharge and while RN CM has been providing services unsuccessfully. Answered Tinisha's questions related to denial of home health services.    Outreach to [facility] [phone]  Left message for [facility] requesting Dr Welton Flakes complete the [facility] form being faxed by [facility] for pt. Requested a return call if further questions  Plan [facility] RN CM will  follow up with patient within the next 30  business days Continue to collaborate with united healthcare, wound care staff and pt  Rinnah Peppel [name], RN, BSN, [place] Coordinator Office number [phone]

## 2022-03-30 ENCOUNTER — Telehealth (INDEPENDENT_AMBULATORY_CARE_PROVIDER_SITE_OTHER): Payer: Self-pay

## 2022-03-30 ENCOUNTER — Other Ambulatory Visit: Payer: Self-pay | Admitting: *Deleted

## 2022-03-30 NOTE — Patient Outreach (Signed)
[facility]) Care Management Telephonic RN Care Manager Note   03/30/2022 Name:  [name] MRN:  [id number] DOB:  02-Dec-1965  Summary: Successful outreach  Need supplies for dressing ABD pads tape and bag for colostomy He was sent samples  [facility] [address], [place], [place] [address], [phone], fax [phone]   Recommendations/Changes made from today's visit: Outreach with alexis to follow up on the PCS form and Dr Welton Flakes assistance in completing  Outreach to [employer] with Ubaldo Glassing on the line to get valerie to fax a PCS form to fax [phone] Alexis to get it filled out  Dressing for decubitus from [facility], wound care  Conference Outreached to pcp office spoke with [place] at the on call center  Conference Outreach to [place] [phone] to inquire about supplies  Allowed pt to ventilate his feelings Received a call from [place] for pt verbal expression to her during  Follow up with pt to update on request for him to have his pcs aide go to [place] [facility] to pick on wound supplies  [facility] [address] [place], [place] [address] discussed with pt  for wound care supplies  [place] medicaid reviewed  Sent pcp a secured message in EPIC requesting assist with colostomy supplies   Subjective: [name] is an 56 y.o. year old male who is a primary patient of [name], NP. The care management team was consulted for assistance with care management and/or care coordination needs.    Telephonic RN Care Manager completed Telephone Visit today.   Objective:  Medications Reviewed Today     Reviewed by Barbaraann Faster, RN (Registered Nurse) on 03/17/22 at 1149  Med List Status: <None>   Medication Order Taking? Sig Documenting Provider Last Dose Status Informant  acetaminophen (TYLENOL) 500 MG tablet [id number] No Take 2 tablets (1,000 mg total) by mouth 3 (three) times daily as needed for mild  pain or moderate pain (or Fever >/= 101). [name] Duff, MD Taking Active   allopurinol (ZYLOPRIM) 100 MG tablet [id number] No Take 1 tablet (100 mg total) by mouth daily. [name], MD Taking Active Self  ascorbic acid (VITAMIN C) 500 MG tablet [id number] No Take 1 tablet (500 mg total) by mouth 2 (two) times daily. [name] Duff, MD Taking Active   aspirin EC 81 MG tablet [id number] No Take 81 mg by mouth every morning. Swallow whole. [provider] Taking Active Self  benzocaine (ANBESOL) 10 % mucosal gel [id number] No Use as directed 1 application. in the mouth or throat daily as needed for mouth pain. [provider] Taking Active Self  Blood Pressure Monitor DEVI [id number] No Please provide patient with insurance approved blood pressure monitor. ICD10 I.10 Gildardo Pounds, NP Taking Active Self  Calcium Carbonate-Vitamin D (CALCIUM-D PO) [id number] No Take 1 tablet by mouth every morning. [provider] Taking Active Self  Chlorhexidine Gluconate Cloth 2 % PADS [id number] No Apply 6 each topically daily. [name] Duff, MD Taking Active   clotrimazole (LOTRIMIN) 1 % cream [id number] No Apply topically 2 (two) times daily.  Patient taking differently: Apply 1 application  topically 2 (two) times daily.   Mitzi Hansen, MD Taking Active Self  diclofenac Sodium (VOLTAREN) 1 % GEL [id number] No Apply 2 g topically 3 (three) times daily. [provider] Taking Active Self  famotidine (PEPCID) 20 MG tablet [id number] No Take 1 tablet (20  mg total) by mouth daily. [name], MD Taking Active Self  famotidine (PEPCID) 40 MG tablet [id number] No SMARTSIG:1 Tablet(s) By Mouth Morning-Night [provider] Taking Active   feeding supplement (ENSURE ENLIVE / ENSURE PLUS) LIQD [id number] No Take 237 mLs by mouth 2 (two) times daily between meals. [name] Duff, MD Taking Active   ferrous sulfate 325 (65 FE) MG tablet [id number] No Take 1  tablet (325 mg total) by mouth daily with breakfast. [name] Duff, MD Taking Active   ibuprofen (ADVIL) 400 MG tablet [id number] No Take 1 tablet (400 mg total) by mouth every 8 (eight) hours as needed. [name] Duff, MD Taking Active   liver oil-zinc oxide (DESITIN) 40 % ointment [id number] No Apply 1 application topically daily. [provider] Taking Active Self  lubiprostone (AMITIZA) 24 MCG capsule [id number] No TAKE 1 CAPSULE (24 MCG TOTAL) BY MOUTH 2 (TWO) TIMES DAILY WITH A MEAL. Zehr, Laban Emperor, PA-C Taking Active   magnesium hydroxide (MILK OF MAGNESIA) 400 MG/5ML suspension [id number] No Take 30 mLs by mouth daily. [name] Duff, MD Taking Active   methadone (DOLOPHINE) 10 MG/5ML solution [id number] No Take 50 mg by mouth daily. [provider] Taking Active Multiple Informants           Med Note [place], MISTY D   Mon Jan 16, 2022  5:43 AM) Per Verdie Drown at [facility] pt last doses at 50 mg qd on 01/10/22 with 1 week of take home doses  Multiple Vitamin (MULTIVITAMIN WITH MINERALS) TABS tablet [id number] No Take 1 tablet by mouth daily. [name] Duff, MD Taking Active   nutrition supplement, Le[place]ord Asal) PACK [id number] No Take 1 packet by mouth 2 (two) times daily between meals. [name] Duff, MD Taking Active   OVER THE COUNTER MEDICATION [id number] No Take 3 capsules by mouth every morning. Omega XL [provider] Taking Active Self  Oxymetazoline HCl (NASAL SPRAY NA) [id number] No Place 1 spray into both nostrils daily as needed (congestion). Family care nasal spray [provider] Taking Active Self  polyethylene glycol (MIRALAX / GLYCOLAX) 17 g packet [id number] No Take 17 g by mouth 2 (two) times daily. [name] Duff, MD Taking Active   predniSONE (DELTASONE) 20 MG tablet [id number] No Take 40 mg by mouth every morning. [provider] Taking Active   saccharomyces boulardii (FLORASTOR) 250 MG capsule [id number] No Take 250 mg  by mouth daily. [provider] Taking Active Self  senna-docusate (SENOKOT-S) 8.6-50 MG tablet [id number] No Take 2 tablets by mouth 2 (two) times daily. [name] Duff, MD Taking Active   Med List Note Acie Fredrickson, Dionne Bucy, CPhT 12/20/21 1200): [place] [phone] Open 5:30 AM-2 PM Monday-Friday, 5:30 AM- 7:30 AM Saturday, closed on Sunday             SDOH:  (Social Determinants of Health) assessments and interventions performed:    Care Plan  Review of patient past medical history, allergies, medications, health status, including review of consultants reports, laboratory and other test data, was performed as part of comprehensive evaluation for care management services.   There are no care plans that you recently modified to display for this patient.    Plan: The patient has been provided with contact information for the care management team and has been advised to call with any health related questions or concerns.  Follow up within the next 30 business days  Cornie Mccomber [name], RN, BSN, [place] Coordinator Office  number [phone] Main [facility] number [phone] Fax number [phone]

## 2022-03-30 NOTE — Telephone Encounter (Signed)
Copied from [place] [id number]. Topic: General - Other >> Mar 30, 2022  2:45 PM Rudene Anda wrote: Reason for CRM: Jackelyn Poling had an appt with the pt today 6/29, called [facility] asking if [facility] nurse could contact her regarding the pts supplies, appt is in [employer]

## 2022-03-31 ENCOUNTER — Other Ambulatory Visit: Payer: Self-pay | Admitting: *Deleted

## 2022-03-31 NOTE — Patient Outreach (Signed)
[place] [facility] [facility]) Care Management  03/31/2022  Ferd Ragan [birth year] [id number]   [place] coordination- home care supplies  Collaborated with [place] RN CM and [facility] hospital liaison about pt home supplies for colostomy, wound  Questions answered and interventions completed discussed  Plan [facility] RN CM will follow up with patient within the next 30 business days  Ralph Brouwer [name], RN, BSN, [place] Coordinator Office number [phone]

## 2022-04-01 DIAGNOSIS — N319 Neuromuscular dysfunction of bladder, unspecified: Secondary | ICD-10-CM | POA: Diagnosis not present

## 2022-04-01 DIAGNOSIS — Z933 Colostomy status: Secondary | ICD-10-CM | POA: Diagnosis not present

## 2022-04-01 DIAGNOSIS — G4733 Obstructive sleep apnea (adult) (pediatric): Secondary | ICD-10-CM | POA: Diagnosis not present

## 2022-04-01 DIAGNOSIS — L89154 Pressure ulcer of sacral region, stage 4: Secondary | ICD-10-CM | POA: Diagnosis not present

## 2022-04-01 DIAGNOSIS — L89314 Pressure ulcer of right buttock, stage 4: Secondary | ICD-10-CM | POA: Diagnosis not present

## 2022-04-03 ENCOUNTER — Other Ambulatory Visit: Payer: Self-pay | Admitting: *Deleted

## 2022-04-03 NOTE — Patient Outreach (Addendum)
[facility] [place]) Care Management Telephonic RN Care Manager Note   04/03/2022 Name:  [name] MRN:  [id number] DOB:  06/01/1966  Summary: [name] called RN CM to confirmed his personal care aide picked up his dressing supplies [name] voiced an concern today with methadone clinic arrival days, and transportation to the methadone clinic and an appointment on Friday  Recommendations/Changes made from today's visit: Outreach conference call with patient Spoke with Lattie Haw at [phone] for [facility] [place]. Confirmed his visit days are now every Wednesdays  Outreach conference call with patient to [facility] transportation [phone] Extensive wait time Spoke with April to [facility] for Friday 04/07/22 10 am and to methadone clinic every Wednesday starting on 04/12/22 9 am They were not able to schedule for a 04/05/22 9 am   Discussed with [name] a change in [facility] RN CM  He voiced understanding E-mail sent to Opal Sidles B at [employer] office related to home supplies for colostomy and sacral decub  Subjective: [name] is an 56 y.o. year old male who is a primary patient of [name] Perna, NP. The care management team was consulted for assistance with care management and/or care coordination needs.    Telephonic RN Care Manager completed Telephone Visit today.   Objective:  Medications Reviewed Today     Reviewed by Barbaraann Faster, RN (Registered Nurse) on 03/17/22 at 1149  Med List Status: <None>   Medication Order Taking? Sig Documenting Provider Last Dose Status Informant  acetaminophen (TYLENOL) 500 MG tablet [id number] No Take 2 tablets (1,000 mg total) by mouth 3 (three) times daily as needed for mild pain or moderate pain (or Fever >/= 101). [name] Duff, MD Taking Active   allopurinol (ZYLOPRIM) 100 MG tablet [id number] No Take 1 tablet (100 mg total) by mouth daily. [name], MD  Taking Active Self  ascorbic acid (VITAMIN C) 500 MG tablet [id number] No Take 1 tablet (500 mg total) by mouth 2 (two) times daily. [name] Duff, MD Taking Active   aspirin EC 81 MG tablet [id number] No Take 81 mg by mouth every morning. Swallow whole. [provider] Taking Active Self  benzocaine (ANBESOL) 10 % mucosal gel [id number] No Use as directed 1 application. in the mouth or throat daily as needed for mouth pain. [provider] Taking Active Self  Blood Pressure Monitor DEVI [id number] No Please provide patient with insurance approved blood pressure monitor. ICD10 I.10 Gildardo Pounds, NP Taking Active Self  Calcium Carbonate-Vitamin D (CALCIUM-D PO) [id number] No Take 1 tablet by mouth every morning. [provider] Taking Active Self  Chlorhexidine Gluconate Cloth 2 % PADS [id number] No Apply 6 each topically daily. [name] Duff, MD Taking Active   clotrimazole (LOTRIMIN) 1 % cream [id number] No Apply topically 2 (two) times daily.  Patient taking differently: Apply 1 application  topically 2 (two) times daily.   Mitzi Hansen, MD Taking Active Self  diclofenac Sodium (VOLTAREN) 1 % GEL [id number] No Apply 2 g topically 3 (three) times daily. [provider] Taking Active Self  famotidine (PEPCID) 20 MG tablet [id number] No Take 1 tablet (20 mg total) by mouth daily. [name], MD Taking Active Self  famotidine (PEPCID) 40 MG tablet [id number] No SMARTSIG:1 Tablet(s) By Mouth Morning-Night [provider] Taking Active   feeding supplement (ENSURE ENLIVE / ENSURE PLUS) LIQD [id number] No Take 237 mLs by mouth 2 (two) times daily between meals.  [name] Duff, MD Taking Active   ferrous sulfate 325 (65 FE) MG tablet [id number] No Take 1 tablet (325 mg total) by mouth daily with breakfast. [name] Duff, MD Taking Active   ibuprofen (ADVIL) 400 MG tablet [id number] No Take 1 tablet (400 mg total) by mouth every 8 (eight) hours as  needed. [name] Duff, MD Taking Active   liver oil-zinc oxide (DESITIN) 40 % ointment [id number] No Apply 1 application topically daily. [provider] Taking Active Self  lubiprostone (AMITIZA) 24 MCG capsule [id number] No TAKE 1 CAPSULE (24 MCG TOTAL) BY MOUTH 2 (TWO) TIMES DAILY WITH A MEAL. Zehr, Laban Emperor, PA-C Taking Active   magnesium hydroxide (MILK OF MAGNESIA) 400 MG/5ML suspension [id number] No Take 30 mLs by mouth daily. [name] Duff, MD Taking Active   methadone (DOLOPHINE) 10 MG/5ML solution [id number] No Take 50 mg by mouth daily. [provider] Taking Active Multiple Informants           Med Note [place] Crane, MISTY D   Mon Jan 16, 2022  5:43 AM) Per Verdie Drown at [facility] pt last doses at 50 mg qd on 01/10/22 with 1 week of take home doses  Multiple Vitamin (MULTIVITAMIN WITH MINERALS) TABS tablet [id number] No Take 1 tablet by mouth daily. [name] Duff, MD Taking Active   nutrition supplement, Leanord Asal) PACK [id number] No Take 1 packet by mouth 2 (two) times daily between meals. [name] Duff, MD Taking Active   OVER THE COUNTER MEDICATION [id number] No Take 3 capsules by mouth every morning. Omega XL [provider] Taking Active Self  Oxymetazoline HCl (NASAL SPRAY NA) [id number] No Place 1 spray into both nostrils daily as needed (congestion). Family care nasal spray [provider] Taking Active Self  polyethylene glycol (MIRALAX / GLYCOLAX) 17 g packet [id number] No Take 17 g by mouth 2 (two) times daily. [name] Duff, MD Taking Active   predniSONE (DELTASONE) 20 MG tablet [id number] No Take 40 mg by mouth every morning. [provider] Taking Active   saccharomyces boulardii (FLORASTOR) 250 MG capsule [id number] No Take 250 mg by mouth daily. [provider] Taking Active Self  senna-docusate (SENOKOT-S) 8.6-50 MG tablet [id number] No Take 2 tablets by mouth 2 (two) times daily. [name] Duff, MD Taking Active    Med List Note Acie Fredrickson, Dionne Bucy, CPhT 12/20/21 1200): [place] [phone] Open 5:30 AM-2 PM Monday-Friday, 5:30 AM- 7:30 AM Saturday, closed on Sunday             SDOH:  (Social Determinants of Health) assessments and interventions performed:    Care Plan  Review of patient past medical history, allergies, medications, health status, including review of consultants reports, laboratory and other test data, was performed as part of comprehensive evaluation for care management services.   Care Plan : RN Care Manager Plan of Care  Updates made by Barbaraann Faster, RN since 04/03/2022 12:00 AM     Problem: Complex Care Coordination Needs and disease management in patient with HTN, transportation, appointments   Priority: High  Onset Date: 02/17/2022     Long-Range Goal: Establish Plan of Care for Management Complex SDOH Barriers, disease management and Care Coordination Needs in patient with HTN, transportation, appointments   Start Date: 02/17/2022  Recent Progress: On track  Priority: High  Note:   Current Barriers:  Knowledge Deficits related to plan of care for management of HTN and chronic pain, social determinant of health needs  Care Coordination needs related to  Transportation and [employer] of community resource: Gaffer barriers Barriers: compliance/engagement- missing/no show/cancelling medical appointments, conflicting transportation resources that no show or does not provide appropriate options to get to appointments [facility] & DSS/Medicaid) leading to missed appointments, limited local wound care home health service staff in network Unsuccessful outreaches 02/24/22, 02/28/22  Pt cancelled appointments 02/21/22 [facility] /pt 02/18/22- rescheduled to 03/21/22 0930 Urology procedure for 03/03/22 cancelled by pt  RN CM Clinical Goal(s):  Patient will verbalize understanding of plan for management of HTN and chronic  pain plus social determinant needs as evidenced by completion of follow up appointments, decrease re admission risks  through collaboration with RN Care manager, provider, and care team.   Interventions: Outreaches for care coordination, disease management, resources, home care/education needs Inter-disciplinary care team collaboration (see longitudinal plan of care) Evaluation of current treatment plan related to  self management and patient's adherence to plan as established by provider   Interdisciplinary Collaboration Interventions/Transportation/home health:  (Status:  continuing to work on improving resources ) Short Term Goal   Collaborated with RN CM to initiate plan of care to address needs related to Transportation in patient with HTN and chronic pain, substance use Collaboration with [name] Perna, NP , various home health offices, [facility] [place] [place] [facility] & [facility] (stephanie-[phone]), medicaid transportation , united healthcare transportation Inter-disciplinary care team collaboration 02/17/22 Outreach with patient to [place] for attempt to get assist for transportation for 02/20/22 methadone appointment Spoke with April at [phone] medicaid transportation Confirmed he has a transportation booking for 02/21/22 but is not able to get assist for 02/20/22 related to needing to call in 3 days prior to appointment April shared with pt that his local DSS medicaid case worker is Rosanna [name]  [phone] RN CM reviewed with him the numbers for his DSS case worker, DSS transportation and united healthcare transportation plus waited for him to write the information down. RN CM also encouraged him to share this with [name]  Outreach with patient to  [facility]) [phone] spoke with Ravin to assist with transportation for 02/20/22 0700 and 02/21/22 0600 to methadone clinic [facility] [address] [place], [place] [address]  [phone]  02/20/22 Confirm #[id number] pick up 0618, pt to call #[phone] 02/21/22 pick up 0511 confirm #[id number], 0630 #[id number]  RN CM outreached to [place] [phone] after discussing with patient about he missed 11/11/21 appointment during his hospitalization. He gave permission for RN CM to outreach to see if another visit could be scheduled  He was offered the earliest appointment for hospital follow up as Friday June 9 at 0930 with Juluis Mire   Unsuccessful attempts x 2 to [facility] [place] [phone]  02/20/22 -Returned pt outreach and Allowed him to ventilate his feelings Encouragement provided Updated him on his upcoming 03/10/22 new pcp appointment with Juluis Mire 03/10/22 0930 that RN CM assisted with scheduling on 02/17/22 Discussed with him this RN CM attempt to [facility] at [facility] on 02/17/22 without success Provided him with his my chart user name and discussed providing education on its use in future 02/22/22 Completed follow up outreach  Allow him to ventilate again  Assessed possible other home health agencies he has had services with before  Attempt to call [phone] [facility] Dr [facility] to cancel the anterior urethroplasty, possible buccal mucosa graft Provided the patient the number to attempt to call Dr Francesca Jewett on 02/23/22 to cancel this appointment Review of EPIC to note on 4/20-24/23 In patient RN CM called these agencies who declined the patient for services [facility], [facility] due to patient was active with their services before but was non compliant with treatment, disrespectful to staff and chronic wound will not be covered by insurance. [place] patient due to staffing. [facility]- Declined due to being at capacity for [facility] referrals at this time. [place]- No RN available for wound care  in patient's area of residence. [facility]- No response [facility]- Not able to accept at this time  02/24/22 RN CM outreached to the following home health agencies that referrals were sent to prior to patient 02/12/22 discharge to check on status update of referrals  [name] [phone] left voice message- previous staffing issues- [name] return a call and confirms no available staff to do extensive wound care  [phone] [name] confirms they are still at capacity for [place] [facility] [place] [facility] wound care center confirmed the dr at the wound care cancelled the patient's Feb 14 2022 wound care visit Consulted [facility] leadership  Attempted to reach staff at [facility] point wound care clinic [phone] to discuss recent visit & frequency, type of dressing changes for pt sacral wound Left a message requesting a return call to RN Cm   RN CM checked pt's list of appointments in [facility] and it does appear his June 2023 urology surgery has been cancelled   Attempted again to reach staff at [facility] point wound care clinic [phone] to discuss recent visit & frequency, type of dressing changes for pt sacral wound Left a message requesting a return call to RN Cm   Outreach to patient to update him [phone]  [facility] Unsuccessful outreach Outreach attempt to the listed at the preferred outreach number in EPIC  No answer. [facility] RN CM left HIPAA [facility] Portability and Accountability Act) compliant voicemail message along with CM's contact info.    02/28/22 Unsuccessful attempt to reach patient for a conference call while speaking with Colletta Maryland at [facility] wound care center to reschedule the cancelled 02/21/22 appointment  Message left for pt to inform him of the attempted plan of care of today and requesting a return call to RN CM Outreach  to [place] [facility] [place] [facility] [phone] To discussed pt care concerns. She reports the patient has been in their care for a long  time and the pt's aide was provided a video of wound care but is not comfortable with during the care. Janett Billow also confirms she informed staff prior to pt hospital discharge that [facility] would not be able to assist with the wound care  03/01/22 RN CM outreach to [facility] at 336 [phone] fax [phone] Initial message left at the referral prompt #1 Requesting a return call to RN CM Left RN CM outreach office number  RN CM routed pt face sheet/demo/insurance + discharge summary (containing wound care) to [facility]  RN CM outreach to [place] home health [phone] spoke with [name] fax [phone] provided to send another referral if needed [name] confirmed the 01/19/22 referral was declined   Outreach to patient successfully RN CM updated pt on care coordination calls  made 02/24/22, 02/28/22 and 03/01/22  As RN CM was attempting to get clarity on how the wound care clinic appointment was cancelled, [name]' increased his pace and tone of speech as he began to verbalize his frustrations. He confirms he cancelled the wound care center appointment as he mistakenly thought the transportation for it would be via united healthcare services. RN CM had reviewed with him several times and it was on his discharge instructions that this transport was via the local medicaid transportation services (this is also why this RN CM made sure he wrote down the medicaid transport number on 02/17/22)  Allowed him to ventilate his feelings He repeatedly discussed that his present medical issues are related to "what they did to me" ,"They lied to me" Empathized with [name]  [name] was given [facility] patient experience number [phone] and [facility] dept/HIM number [phone]  He was assisted by RN CM completing an e-mail, cc him on the e-mail to [facility] requesting his medical records for his [place] wound care center care from 2018 to 2023 to  include pictures  Reminded pt of the importance of attending and getting transportation to his new pcp appointment on 03/10/22 0930 ( he wrote this down again) and [facility]  Discussed the wound care clinic office policy related to dismissing patients for 2 no shows or missed appointments.  03/02/22 & 03/08/22 RN CM called medicaid transportation [phone] spoke with Lenora to schedule transport for 03/10/22 & 03/21/22 appointments with requests not to use [facility] services per pt preference.  Butch Penny confirmed on 03/08/22 transports scheduled with [facility] for each appointment and pt will get calls on 03/09/22 & 03/20/22 03/08/22 Follow call to patient completed Assessed for changes updated on care coordination for appointment transportation and continued search for in network home health wound care agencies via  [facility] care [facility]) online site [email or web address] from his insurance card No success in finding agencies Discussed with pt the need for him and RN CM to outreach to united healthcare via telephone to attempt to receive possible outliner assistance if possible  Encouraged pt to call RN CM if he does not receive a call on 03/09/22 about his transport to his appointment for 03/10/22 03/15/22 Conference to united healthcare customer service with patient, spoke with Learta Codding to discuss difficulty with finding home health service for wound care for patient. List of [place] in network agencies provided included a lot agencies referrals have already been sent to. Learta Codding reports noting this, to discuss with her supervisor and outreach to pt and RN CM prn. Pt was able to get clarity that Romelle Starcher and [facility] are not in network. Pt sent a list of in network pcps & dentists. RN CM sent a list of in network [facility] agencies. RN CM to send referral to the agencies that have never received one  03/17/22  Assisted with outreaches to methadone clinic, DSS for 03/22/22 transportation needed to methadone clinic,  Assisted with conference call to [facility] ombudsman staff, Lanelle Bal to review his concerns with [employer] transportation. Pt informed today he would have to use [facility] vs preferred [facility] for 03/22/22   Sent via EPIC routing home health referrals (including demo/insurance, face to face order, and discharge summary) to  [facility] home care fax [phone]  [facility] fax [place] fax [phone] [facility] [place] health fax [phone] Home health professionals of [place] fax [phone] [facility] home [facility] [phone] 03/21/22 updated by pt of [facility] outreach to him for home services. Outreach to [place] at [employer] billing to collaborate on [place] delivery to wound care center. Prior authorization needed from united healthcare medicare 03/24/22 outreach to [facility] to attempt to follow up on transport for 03/30/22 will need to return call to valerie per Tammy.  Consulted [facility] SW for questions about stretcher transport (had spoken to Rake on 03/22/22 about this case/e-mail sent) 03/27/22 calls to [place] to f/u on upcoming transportation for wound center after no response. [name] with no contact from [facility].  Safe ride provide Naval architect) various answers: no ride, suggest shepherd ride, safe ride # given. Safe ride refers to [facility] then was closed with second call 03/28/22 Kendrick Fries of safe ride confirm safe ride only does ambulatory rides no stretchers. Reviewed with [facility] leadership, e-mail to [facility] staff Joylene Draft for possible assistance.  Cancellation of 03/30/22 wound care visit. Requested Dr Welton Flakes assist with [facility] pre authorization for the stretcher transport order/letter. Updated pt. 03/29/22 calls to [place] and Donalynn Furlong to collaborate on [facility] non emergency transportation for pt. It was agreed that Dr Welton Flakes would need to complete a PCS (Physician Certification Statement) form, sign it  and print his name on it for [facility]. [name] was offered Dr Vivi Barrack office fax [phone] so she could fax a copy of the form to ATTN Alexis/Dr Welton Flakes   RN CM discussed [name] Hersch' and RN CM difficulties with finding an in network home health agency to provide wound care to request any possible assistance from [facility] to obtain needed services 03/31/22 Collaborated with [place] RN CM and [facility] hospital liaison about pt home supplies for colostomy, wound  Questions answered and interventions completed discussed 04/03/22 assisted with clarifying methadone clinic arrival days and transportation to methadone clinic and urology clinic visits. Discussed RN CM pending change. Updated pcp RN CM on supply needs via e-mail   Hypertension Interventions:  (Status:  Goal on track:  Yes.) Short Term Goal Update low BPs see below Last practice recorded BP readings:  BP Readings from Last 3 Encounters:  03/10/22 101/66  02/12/22 (!) 112/51  12/30/21 (!) 112/51  Most recent eGFR/CrCl:  Lab Results  Component Value Date   EGFR 39 (L) 12/30/2021    No components found for: "CRCL"  Discussed plans with patient for ongoing care management follow up and provided patient with direct contact information for care management team Discussed complications of poorly controlled blood pressure such as heart disease, stroke, circulatory complications, vision complications, kidney impairment, sexual dysfunction Screening for signs and symptoms of depression related to chronic disease state  Assessed social determinant of health barriers  Pain Interventions:  (Status:  Goal on track:  Yes.) Long Term Goal 03/17/22 manageable 03/08/22 managing with home medicine regimen Pain assessment performed Medications reviewed Reviewed provider established plan for pain management Discussed importance of adherence to all scheduled medical appointments Counseled on the importance of reporting any/all new or changed pain symptoms or management  strategies to pain management provider Screening for signs and symptoms of depression related to chronic disease state  Assessed social determinant of health barriers  Patient Goals/Self-Care Activities: Take all medications as prescribed Attend all scheduled provider appointments Attend church or other social activities Perform all self care activities independently  Call  provider office for new concerns or questions  Go to Methadone clinic appointments Go to wound care management appointments Keep wound a clean as he can Call with transportation concerns Attend methadone clinic appointments 03/17/22 Established care with new pcp Called RN CM when noted conflict in appointments for next week. 03/21/22 Pt spoke with [facility] for home health wound services and went to his wound care appointment with provided transportation but wound center staff did not view wound as wanted pt transported to them via stretcher.  Follow Up Plan:  The patient has been provided with contact information for the care management team and has been advised to call with any health related questions or concerns.  The care management team will reach out to the patient again over the next 30 business days.       Plan: The patient has been provided with contact information for the care management team and has been advised to call with any health related questions or concerns.    Eleno Weimar [name], RN, BSN, [place] Coordinator Office number [phone] [facility] number [phone] Fax number [phone]

## 2022-04-03 NOTE — Telephone Encounter (Signed)
[name]- I received this information from [name], RN/[facility]N regarding his supplies:  He uses [employer] for colostomy supplies (2-piece 2 and 3/4 inch ostomy pouching system with skin barrier ring) and they just need an order faxed  He is using silver hydrofiber (Aquacel Ag+, [place] # [phone]), tuck into crevices using cotton tipped applicator where able. Top dressings with ABD pads and secure with paper tape.    I asked [name] where the wound care supplies are ordered from and if he has seen the ostomy NP at the hospital

## 2022-04-04 ENCOUNTER — Other Ambulatory Visit (INDEPENDENT_AMBULATORY_CARE_PROVIDER_SITE_OTHER): Payer: Self-pay | Admitting: Primary Care

## 2022-04-04 DIAGNOSIS — L89154 Pressure ulcer of sacral region, stage 4: Secondary | ICD-10-CM

## 2022-04-10 ENCOUNTER — Other Ambulatory Visit: Payer: Self-pay | Admitting: *Deleted

## 2022-04-10 ENCOUNTER — Encounter: Payer: Self-pay | Admitting: *Deleted

## 2022-04-10 NOTE — Patient Outreach (Signed)
[facility]) Care Management Telephonic RN Care Manager Note   04/10/2022 Name:  [name] MRN:  [id number] DOB:  1966/07/09  Summary: Pt left RN CM a voice message requesting a return call RN CM returned his call  He reports he received a call on 04/06/22 to changed his 04/07/22 [facility] nurse foley change to 04/14/22 3 PM He was informed the urologist had to see him vs the nurse  He confirms he was able to attend his methadone clinic appointment He has not had any calls from the [facility]  Pt disconnected from the call after a long wait for [facility] transportation services   Recommendations/Changes made from today's visit: Conference outreach with pt to [facility] no answer Then to [place] transportation (medicaid) [phone] spoke with Joelene Millin to assist with transportation to [facility] urology [place] [address] Left a message for [place] at [facility] [phone] Outreach to [place] with attempt to reach [name] to inquire about stretcher transport to the wound care center Spoke with Tammy as valerie was not available Pending valerie return  Left a message for Josefine Class at [place] [phone] fax [phone] [address] [place]   Subjective: [name] is an 56 y.o. year old male who is a primary patient of [name], NP. The care management team was consulted for assistance with care management and/or care coordination needs.    Telephonic RN Care Manager completed Telephone Visit today.   Objective:  Medications Reviewed Today     Reviewed by Barbaraann Faster, RN (Registered Nurse) on 03/17/22 at 1149  Med List Status: <None>   Medication Order Taking? Sig Documenting Provider Last Dose Status Informant  acetaminophen (TYLENOL) 500 MG tablet [id number] No Take 2 tablets (1,000 mg total) by mouth 3 (three)  times daily as needed for mild pain or moderate pain (or Fever >/= 101). [name] Duff, MD Taking Active   allopurinol (ZYLOPRIM) 100 MG tablet [id number] No Take 1 tablet (100 mg total) by mouth daily. [name], MD Taking Active Self  ascorbic acid (VITAMIN C) 500 MG tablet [id number] No Take 1 tablet (500 mg total) by mouth 2 (two) times daily. [name] Duff, MD Taking Active   aspirin EC 81 MG tablet [id number] No Take 81 mg by mouth every morning. Swallow whole. [provider] Taking Active Self  benzocaine (ANBESOL) 10 % mucosal gel [id number] No Use as directed 1 application. in the mouth or throat daily as needed for mouth pain. [provider] Taking Active Self  Blood Pressure Monitor DEVI [id number] No Please provide patient with insurance approved blood pressure monitor. ICD10 I.10 Gildardo Pounds, NP Taking Active Self  Calcium Carbonate-Vitamin D (CALCIUM-D PO) [id number] No Take 1 tablet by mouth every morning. [provider] Taking Active Self  Chlorhexidine Gluconate Cloth 2 % PADS [id number] No Apply 6 each topically daily. [name] Duff, MD Taking Active   clotrimazole (LOTRIMIN) 1 % cream [id number] No Apply topically 2 (two) times daily.  Patient taking differently: Apply 1 application  topically 2 (two) times daily.   Mitzi Hansen, MD Taking Active Self  diclofenac Sodium (VOLTAREN) 1 % GEL [id number] No Apply 2 g topically 3 (three) times daily. [provider] Taking Active Self  famotidine (PEPCID) 20 MG tablet [id number] No Take 1 tablet (20 mg  total) by mouth daily. [name], MD Taking Active Self  famotidine (PEPCID) 40 MG tablet [id number] No SMARTSIG:1 Tablet(s) By Mouth Morning-Night [provider] Taking Active   feeding supplement (ENSURE ENLIVE / ENSURE PLUS) LIQD [id number] No Take 237 mLs by mouth 2 (two) times daily between meals. [name] Duff, MD Taking Active   ferrous sulfate 325 (65 FE)  MG tablet [id number] No Take 1 tablet (325 mg total) by mouth daily with breakfast. [name] Duff, MD Taking Active   ibuprofen (ADVIL) 400 MG tablet [id number] No Take 1 tablet (400 mg total) by mouth every 8 (eight) hours as needed. [name] Duff, MD Taking Active   liver oil-zinc oxide (DESITIN) 40 % ointment [id number] No Apply 1 application topically daily. [provider] Taking Active Self  lubiprostone (AMITIZA) 24 MCG capsule [id number] No TAKE 1 CAPSULE (24 MCG TOTAL) BY MOUTH 2 (TWO) TIMES DAILY WITH A MEAL. Zehr, Laban Emperor, PA-C Taking Active   magnesium hydroxide (MILK OF MAGNESIA) 400 MG/5ML suspension [id number] No Take 30 mLs by mouth daily. [name] Duff, MD Taking Active   methadone (DOLOPHINE) 10 MG/5ML solution [id number] No Take 50 mg by mouth daily. [provider] Taking Active Multiple Informants           Med Note [place] Crane, MISTY D   Mon Jan 16, 2022  5:43 AM) Per Verdie Drown at [facility] pt last doses at 50 mg qd on 01/10/22 with 1 week of take home doses  Multiple Vitamin (MULTIVITAMIN WITH MINERALS) TABS tablet [id number] No Take 1 tablet by mouth daily. [name] Duff, MD Taking Active   nutrition supplement, Leanord Asal) PACK [id number] No Take 1 packet by mouth 2 (two) times daily between meals. [name] Duff, MD Taking Active   OVER THE COUNTER MEDICATION [id number] No Take 3 capsules by mouth every morning. Omega XL [provider] Taking Active Self  Oxymetazoline HCl (NASAL SPRAY NA) [id number] No Place 1 spray into both nostrils daily as needed (congestion). Family care nasal spray [provider] Taking Active Self  polyethylene glycol (MIRALAX / GLYCOLAX) 17 g packet [id number] No Take 17 g by mouth 2 (two) times daily. [name] Duff, MD Taking Active   predniSONE (DELTASONE) 20 MG tablet [id number] No Take 40 mg by mouth every morning. [provider] Taking Active   saccharomyces boulardii (FLORASTOR) 250 MG  capsule [id number] No Take 250 mg by mouth daily. [provider] Taking Active Self  senna-docusate (SENOKOT-S) 8.6-50 MG tablet [id number] No Take 2 tablets by mouth 2 (two) times daily. [name] Duff, MD Taking Active   Med List Note Acie Fredrickson, Dionne Bucy, CPhT 12/20/21 1200): [place] [phone] Open 5:30 AM-2 PM Monday-Friday, 5:30 AM- 7:30 AM Saturday, closed on Sunday             SDOH:  (Social Determinants of Health) assessments and interventions performed:    Care Plan  Review of patient past medical history, allergies, medications, health status, including review of consultants reports, laboratory and other test data, was performed as part of comprehensive evaluation for care management services.   There are no care plans that you recently modified to display for this patient.    Plan: The patient has been provided with contact information for the care management team and has been advised to call with any health related questions or concerns.  Follow up within the next 30 business days   Juliahna Wiswell [name], RN, BSN, [place] Coordinator Office  number [phone] Main [facility] number [phone] Fax number [phone]

## 2022-04-14 DIAGNOSIS — G4733 Obstructive sleep apnea (adult) (pediatric): Secondary | ICD-10-CM | POA: Diagnosis not present

## 2022-04-19 ENCOUNTER — Other Ambulatory Visit: Payer: Self-pay | Admitting: *Deleted

## 2022-04-19 NOTE — Patient Outreach (Signed)
  Care Coordination   Follow Up Return call  Visit Note   04/19/2022 Name: [name] MRN: [id number] DOB: Feb 22, 1966  [name] is a 56 y.o. year old male who sees [name], NP for primary care. I spoke with  [name], academic by phone today  What matters to the patients health and wellness today?   [name] left a few messages during RN CM leave of Absence requesting a return call back With the return call to him he reports " Oh no I just had not heard from you in a while." RN CM spoke with [name] on 04/10/22 last and completed various care coordination RN CM reminded him that RN CM discussed with him being out of the office and his next appointment date. He reports he forgot. RN CM inquired if he was receiving outbound visit calls related to our visits to include the one scheduled this week on 04/20/22 at 1 pm.   He confirms he did attend his 04/14/22 urology appointment.  He denies concerns or questions RN CM updated him on the messages left for the wound care clinic, Unsuccessful attempt to reach [facility]  Returned a call to patient No calls from [employer]  He attempted to call and office was closed.   Agrees to follow as scheduled this week   Goals Addressed   None     SDOH assessments and interventions completed:   No   Care Coordination Interventions Activated:  Yes Care Coordination Interventions:   Yes, provided  Follow up plan: Follow up call scheduled for within the next 30 business days  Encounter Outcome:  Pt. Visit Completed  [name] [name], RN, BSN, [place] Coordinator Office number [phone]

## 2022-04-20 ENCOUNTER — Ambulatory Visit: Payer: Self-pay | Admitting: *Deleted

## 2022-04-20 NOTE — Patient Outreach (Signed)
  Care Coordination   Follow Up Visit Note   04/20/2022 Name: [name] MRN: [id number] DOB: 1965/12/02  [name] is a 56 y.o. year old male who sees [name], NP for primary care. I spoke with  Ramie Librarian, academic by phone today  What matters to the patients health and wellness today?  Wound care center visit, ostomy supplies, wound care nurse   Goals Addressed               This Visit's Progress     Patient Stated     Patient will be assisted to obtain home ostomy supplies (pt-stated)        Care Coordination Interventions: Evaluation of current treatment plan related to ostomy supply needs and patient's adherence to plan as established by provider Advised patient to find the specific coloplast items names, etc need to be used in MD order to be faxed to [employer] Discussed plans with patient for ongoing care management follow up and provided patient with direct contact information for care management team Screening for signs and symptoms of depression related to chronic disease state  Assessed social determinant of health barriers  Collaborate with pcp office , [employer], [place] RN and patient       Patient will obtain a stretcher transported visit to wound care center (pt-stated)          SDOH assessments and interventions completed:   No   Care Coordination Interventions Activated:  Yes Care Coordination Interventions:   Yes, provided Outreach with conference calls to [place] at [facility] wound care center and [name] at [place] to coordinate an Consulting civil engineer transport for pt on Monday April 24 2022 at 10 am to be seen by Dr Welton Flakes for first evaluation at [facility] wound [facility]  Outreach secure chat after reviewing wound care notes in [facility] & care everywhere to attempt to find ostomy supplier name as with outreach to [employer] today it was confirmed that Mr Drost only gets his motorized wheel chair via their service. Plus an e-mail was sent  to pcp RN CM to inquire of status of assistance with ostomy supplies.  Spoke with patient and his PCS aide to update them and review the 04/24/22 wound care clinic appointment via [place], another message to [place] home [facility], Calls to [employer] without success in finding a ostomy account for him. Pt reports sacral wound picture shared with him by his aide shows improvement. RN CM thanked his PCS aide for filling in the Gap of care/assisting pt prn.  RN CM was able to get the patient to agree that he would find the [employer] ostomy supply box at his home to provide the correct information to his pcp when an order is fax to [employer].  Pt reports he can not recall previous ostomy supplier as "the nurses handled that" but reports [employer] sent him some "samples that work perfectly" he would like to start using  Spoke with [name] Frederickson at [employer] No notes for pt inquiry about [employer] supplies To open account needs MD order, Clinical notes and Demographics faxed  Follow up plan: Follow up call scheduled for within the next 30 business days  Encounter Outcome:  Pt. Visit Completed   Claus Silvestro [name], RN, BSN, [place] Coordinator Office number [phone]

## 2022-04-21 ENCOUNTER — Other Ambulatory Visit: Payer: Self-pay | Admitting: *Deleted

## 2022-04-21 NOTE — Patient Outreach (Signed)
  Care Coordination   Follow Up Visit Note   04/21/2022 Name: [name] MRN: [id number] DOB: 1966-09-02  [name] is a 56 y.o. year old male who sees [name], NP for primary care. I spoke with  Konor Librarian, academic by phone today  What matters to the patients health and wellness today?  [employer]oplast ostomy information needed for order details to be fax to [employer] Addressed   None     SDOH assessments and interventions completed:   No   Care Coordination Interventions Activated:  Yes Care Coordination Interventions:  Yes, provided  Sent patient an e mail so that he will be able to return an e mail to with the information about [employer] ostomy supplies he prefers and needs to be written on an order to be faxed to [employer] to the patient to make him aware of the e mail and encouraged him to follow through  He returned an Email with the follow information -  [employer] supplies 43568 - Mio Click wE 61UO 37-29  [id number] - [id number] Click opW mx w/w 75PY      [id number] - BRAVA ADHESIVE REMOVER WIPES      [id number] 12060 - BRAVA LUBRICATING DEODORANT; 0.27 OZ SACHET      [id number] 12076 - [employer] Elastic Tape XL 30pcs  This information was forward to the pcp RN Cm in case PCP to include in the order to [employer] prn    Follow up plan: Follow up call scheduled for within the next 30 days   Encounter Outcome:  Pt. Visit Completed   Jovee Dettinger [name], RN, BSN, [place] Coordinator Office number [phone] Main [facility] number [phone] Fax number [phone]

## 2022-04-24 DIAGNOSIS — L89894 Pressure ulcer of other site, stage 4: Secondary | ICD-10-CM | POA: Diagnosis not present

## 2022-04-24 DIAGNOSIS — L89154 Pressure ulcer of sacral region, stage 4: Secondary | ICD-10-CM | POA: Diagnosis not present

## 2022-04-24 DIAGNOSIS — G8220 Paraplegia, unspecified: Secondary | ICD-10-CM | POA: Diagnosis not present

## 2022-04-24 DIAGNOSIS — L89314 Pressure ulcer of right buttock, stage 4: Secondary | ICD-10-CM | POA: Diagnosis not present

## 2022-04-25 DIAGNOSIS — L89313 Pressure ulcer of right buttock, stage 3: Secondary | ICD-10-CM | POA: Diagnosis not present

## 2022-05-04 ENCOUNTER — Telehealth: Payer: Self-pay

## 2022-05-04 ENCOUNTER — Other Ambulatory Visit: Payer: Self-pay | Admitting: *Deleted

## 2022-05-04 NOTE — Patient Outreach (Signed)
Care Coordination   Follow Up Visit Note   05/04/2022 Name: Kekoa Fyock MRN: [id number] DOB: 1966/02/05  Clarice Lipe is a 56 y.o. year old male who sees [name], NP for primary care. I spoke with  Dearius Tavares by phone today after he left a voice message on 05/03/22 and RN CM returned his call  What matters to the patients health and wellness today?  Updates:  Wound care clinic appointment in July went well, Dr Welton Flakes now wants to see him monthly, reports he has been informed his sacral wound went from a stage 4 to a stage 3. He confirms wound care supplies were provided   Transportation for upcoming appointments with wound and urologist    Complex care case closure discussed with Mr Librarian, academic by RN CM as RN CM re assign to [place] services. Discussed his future services from his pcp RN CM, PCS aide, DSS case worker, wound care staff.   Goals Addressed   None    Care Plan : RN Care Manager Plan of Care  Updates made by Barbaraann Faster, RN since 05/04/2022 12:00 AM     Problem: Complex Care Coordination Needs and disease management in patient with HTN, transportation, appointments   Priority: High  Onset Date: 02/17/2022     Long-Range Goal: Establish Plan of Care for Management Complex SDOH Barriers, disease management and Care Coordination Needs in patient with HTN, transportation, appointments   Start Date: 02/17/2022  This Visit's Progress: On track  Recent Progress: On track  Priority: High  Note:   Current Barriers:  Knowledge Deficits related to plan of care for management of HTN and chronic pain, social determinant of health needs  Care Coordination needs related to Transportation and Lacks knowledge of community resource: transportation Transportation barriers Barriers: compliance/engagement- missing/no show/cancelling medical appointments related to knowledge/cognitive issue (remembering to)with managing & scheduling appointments  and transportation, conflicting transportation resources that no show or does not provide appropriate options to get to appointments [facility] & DSS/Medicaid) leading to missed appointments, limited local wound care home health service staff in network Unsuccessful outreaches 02/24/22, 02/28/22  Pt cancelled appointments 02/21/22 [facility] /pt 02/18/22- rescheduled to 03/21/22 0930 Urology procedure for 03/03/22 cancelled by pt  RN CM Clinical Goal(s):  Patient will verbalize understanding of plan for management of HTN and chronic pain plus social determinant needs as evidenced by completion of follow up appointments, decrease re admission risks  through collaboration with RN Care manager, provider, and care team.   Interventions: Outreaches for care coordination, disease management, resources, home care/education needs Inter-disciplinary care team collaboration (see longitudinal plan of care) Evaluation of current treatment plan related to  self management and patient's adherence to plan as established by provider   Interdisciplinary Collaboration Interventions/Transportation/home health:  (Status:  improved transportation, wound care clinic visit, pending ostomy supplies, pending f/u pcp visit, pending complex care closure with transfer to community resources (pcp RN CM, DSS staff/[facility], Wound care clinic staff, [facility], PCS aide+  ) Short Term Goal   Collaborated with RN CM to initiate plan of care to address needs related to Transportation in patient with HTN and chronic pain, substance use Collaboration with [name], NP , various home health offices, [facility] [place] [place] [facility] (stephanie-[phone]), medicaid transportation , united healthcare transportation Inter-disciplinary care team collaboration 02/17/22 Outreach with patient to [place] for  attempt to get assist for transportation for  02/20/22 methadone appointment Spoke with April at [phone] medicaid transportation Confirmed he has a transportation booking for 02/21/22 but is not able to get assist for 02/20/22 related to needing to call in 3 days prior to appointment April shared with pt that his local DSS medicaid case worker is Rosanna [name]  [phone] RN CM reviewed with him the numbers for his DSS case worker, DSS transportation and united healthcare transportation plus waited for him to write the information down. RN CM also encouraged him to share this with [name]  Outreach with patient to  [facility]) [phone] spoke with Ravin to assist with transportation for 02/20/22 0700 and 02/21/22 0600 to methadone clinic [facility] [address] [place], [place] [address]  [phone]  02/20/22 Confirm #[id number] pick up 0618, pt to call #[phone] 02/21/22 pick up 0511 confirm #[id number], 0630 #[id number]  RN CM outreached to [place] [phone] after discussing with patient about he missed 11/11/21 appointment during his hospitalization. He gave permission for RN CM to outreach to see if another visit could be scheduled  He was offered the earliest appointment for hospital follow up as Friday June 9 at 0930 with Juluis Mire   Unsuccessful attempts x 2 to [facility] [place] [phone]  02/20/22 -Returned pt outreach and Allowed him to ventilate his feelings Encouragement provided Updated him on his upcoming 03/10/22 new pcp appointment with Juluis Mire 03/10/22 0930 that RN CM assisted with scheduling on 02/17/22 Discussed with him this RN CM attempt to reach staff at [facility] on 02/17/22 without success Provided him with his my chart user name and discussed providing education on its use in future 02/22/22 Completed follow up outreach  Allow him to ventilate again  Assessed possible other home health agencies  he has had services with before  Attempt to call [phone] [facility] Dr Francesca Jewett [facility] to cancel the anterior urethroplasty, possible buccal mucosa graft Provided the patient the number to attempt to call Dr Francesca Jewett on 02/23/22 to cancel this appointment Review of EPIC to note on 4/20-24/23 In patient RN CM called these agencies who declined the patient for services [facility], [place] due to patient was active with their services before but was non compliant with treatment, disrespectful to staff and chronic wound will not be covered by insurance. [place] patient due to staffing. [facility]- Declined due to being at capacity for [facility] referrals at this time. [place]- No RN available for wound care in patient's area of residence. [facility]- No response [facility]- Not able to accept at this time  02/24/22 RN CM outreached to the following home health agencies that referrals were sent to prior to patient 02/12/22 discharge to check on status update of referrals  [name] [phone] left voice message- previous staffing issues- [name] return a call and confirms no available staff to do extensive wound care  [phone] [name] confirms they are still at capacity for [place] [facility] [place] [facility] wound care center confirmed the dr at the wound care cancelled the patient's Feb 14 2022 wound care visit Consulted [facility] leadership  Attempted to reach staff at [facility] point wound care clinic [phone] to discuss recent visit & frequency, type of dressing changes for pt sacral wound Left a message requesting  a return call to RN Cm   RN CM checked pt's list of appointments in [facility] and it does appear his June 2023 urology surgery has been cancelled   Attempted again to reach staff at [facility] point wound care clinic [phone] to discuss recent visit & frequency, type of dressing changes for pt sacral wound Left a message requesting a return call to RN  Cm   Outreach to patient to update him [phone]  [facility] Unsuccessful outreach Outreach attempt to the listed at the preferred outreach number in EPIC  No answer. [facility]N RN CM left HIPAA [name] Portability and Accountability Act) compliant voicemail message along with CM's contact info.    02/28/22 Unsuccessful attempt to reach patient for a conference call while speaking with Colletta Maryland at [facility] wound care center to reschedule the cancelled 02/21/22 appointment  Message left for pt to inform him of the attempted plan of care of today and requesting a return call to RN CM Outreach  to [place] [facility] [place] home [phone] To discussed pt care concerns. She reports the patient has been in their care for a long time and the pt's aide was provided a video of wound care but is not comfortable with during the care. Janett Billow also confirms she informed staff prior to pt hospital discharge that [facility] would not be able to assist with the wound care  03/01/22 RN CM outreach to [facility] at 336 [phone] fax [phone] Initial message left at the referral prompt #1 Requesting a return call to RN CM Left RN CM outreach office number  RN CM routed pt face sheet/demo/insurance + discharge summary (containing wound care) to [facility]  RN CM outreach to [place] home health [phone] spoke with [name] fax [phone] provided to send another referral if needed [name] confirmed the 01/19/22 referral was declined   Outreach to patient successfully RN CM updated pt on care coordination calls made 02/24/22, 02/28/22 and 03/01/22  As RN CM was attempting to get clarity on how the wound care clinic appointment was cancelled, Mr Cartmell' increased his pace and tone of speech as he began to verbalize his frustrations. He confirms he cancelled the wound care center appointment as he mistakenly thought the transportation for it would be via united healthcare services. RN  CM had reviewed with him several times and it was on his discharge instructions that this transport was via the local medicaid transportation services (this is also why this RN CM made sure he wrote down the medicaid transport number on 02/17/22)  Allowed him to ventilate his feelings He repeatedly discussed that his present medical issues are related to "what they did to me" ,"They lied to me" Empathized with Mr Valente  Mr Biello was given 's patient experience number [phone] and [facility] medical records dept/HIM number [phone]  He was assisted by RN CM completing an e-mail, cc him on the e-mail to [facility] HIM requesting his medical records for his [place] wound care center care from 2018 to 2023 to include pictures  Reminded pt of the importance of attending and getting transportation to his new pcp appointment on 03/10/22 0930 ( he wrote this down again) and [facility]  Discussed the wound care clinic office policy related to dismissing patients for 2 no shows or missed appointments.  03/02/22 & 03/08/22 RN CM called medicaid transportation [phone] spoke  with Gaspar Skeeters to schedule transport for 03/10/22 & 03/21/22 appointments with requests not to use [facility] services per pt preference.  Butch Penny confirmed on 03/08/22 transports scheduled with [facility] for each appointment and pt will get calls on 03/09/22 & 03/20/22 03/08/22 Follow call to patient completed Assessed for changes updated on care coordination for appointment transportation and continued search for in network home health wound care agencies via  [facility] care [facility]) online site [email or web address] from his insurance card No success in finding agencies Discussed with pt the need for him and RN CM to outreach to united healthcare via telephone to attempt to receive possible outliner assistance if possible  Encouraged pt to call RN CM if he does not receive a call on 03/09/22 about his transport to his  appointment for 03/10/22 03/15/22 Conference to united healthcare customer service with patient, spoke with Learta Codding to discuss difficulty with finding home health service for wound care for patient. List of [place] in network agencies provided included a lot agencies referrals have already been sent to. Learta Codding reports noting this, to discuss with her supervisor and outreach to pt and RN CM prn. Pt was able to get clarity that Romelle Starcher and [facility] are not in network. Pt sent a list of in network pcps & dentists. RN CM sent a list of in network [facility] agencies. RN CM to send referral to the agencies that have never received one  03/17/22  Assisted with outreaches to methadone clinic, DSS for 03/22/22 transportation needed to methadone clinic, Assisted with conference call to [place] ombudsman staff, Lanelle Bal to review his concerns with [employer] transportation. Pt informed today he would have to use [facility] vs preferred [facility] for 03/22/22   Sent via EPIC routing home health referrals (including demo/insurance, face to face order, and discharge summary) to  [facility] home care fax [phone]  [facility] fax [place] fax [phone] [facility] [place] [facility] fax [phone] Home health professionals of [place] fax [phone] [facility] [phone] 03/21/22 updated by pt of [facility] outreach to him for home services. Outreach to [place] at [employer] billing to collaborate on [place] delivery to wound care center. Prior authorization needed from united healthcare medicare 03/24/22 outreach to [facility] to attempt to follow up on transport for 03/30/22 will need to return call to valerie per Tammy.  Consulted [facility] SW for questions about stretcher transport (had spoken to [place] on 03/22/22 about this case/e-mail sent) 03/27/22 calls to [place] to f/u on upcoming transportation for wound center after no  response. [name] with no contact from [facility].  Safe ride provide Naval architect) various answers: no ride, suggest shepherd ride, safe ride # given. Safe ride refers to [facility] then was closed with second call 03/28/22 Kendrick Fries of safe ride confirm safe ride only does ambulatory rides no stretchers. Reviewed with [facility] leadership, e-mail to [facility] staff Joylene Draft for possible assistance.  Cancellation of 03/30/22 wound care visit. Requested Dr Welton Flakes assist with [facility] pre authorization for the stretcher transport order/letter. Updated pt. 03/29/22 calls to [place] and Donalynn Furlong to collaborate on [facility] non emergency transportation for pt. It was agreed that Dr Welton Flakes would need to complete a PCS (Physician Certification Statement) form, sign it and print his name on it for [facility]. [name] was offered Dr Vivi Barrack office fax #  [phone] so she could fax a copy of the form to ATTN Alexis/Dr Welton Flakes   RN CM discussed Mr Nyborg' and RN CM difficulties with finding an in network home health agency to provide wound care to request any possible assistance from [facility] to obtain needed services 03/31/22 Collaborated with [place] RN CM and [facility] hospital liaison about pt home supplies for colostomy, wound  Questions answered and interventions completed discussed 04/03/22 assisted with clarifying methadone clinic arrival days and transportation to methadone clinic and urology clinic visits. Discussed RN CM pending change. Updated pcp RN CM on supply needs via e-mail 04/21/22 Sent patient an e mail so that he will be able to return an e mail to with the information about [employer] ostomy supplies he prefers and needs to be written on an order to be faxed to [employer] to the patient to make him aware of the e mail and encouraged him to follow through  He returned an E-mail with the follow information This information was forward to pcp RN CM 05/04/22 returned call to pt after he left a voice message on 05/03/22 -improved  transportation, wound care clinic visit, pending ostomy supplies, pending f/u pcp visit, pending complex care closure with transfer to community resources (pcp RN CM, DSS staff/[facility], Wound care clinic staff, [facility], PCS aide+  Spoke with pcp RN CM Opal Sidles B, about further need of ostomy supply assistance with possible referral to [facility] ostomy clinic prn, need for pcp follow up appointment (? 05/29/22 3: 10 pm) Update provided on resolution of gaps for transportation best with DSS [facility] vs UHC benefit, wound care clinic visit via [place] with [employer] [facility] It is pt responsibility to tell [facility] staff at end of each appointment the new appointment date for transport. Spoke with [facility] staff Pt did not inform [facility] of the 05/22/22 wound care clinic stretcher follow up appointment and it is too earlier to enter per staff. Their requirements is the pt is to call 2 weeks prior to the to scheduled appointment. Mr Jenniges will need to call in on 05/08/22 to for the 05/22/22 appointment but staff today took the information just in case from RN CM.  Outreach to [place] transportation staff [phone] to assist with transportation for 05/18/22 urology transportation [place] with [place]. She entered in the transportation hold for [facility] but requests a follow up on next week as pt listed for no outside of [employer]. Provided Dr Iona Hansen (pt general urologist) fax for authorization to be seen by  Edythe Clarity, PA on the 8/17/231 pm for foley change if needed outside of [facility] with [place] at the office of [place] who informs RN CM Georgina Quint only sees patients at [address] Scot Dock and pt is scheduled to arrive at 1245 on 05/18/22 on 3rd floor  RN CM returned a call to [place], [place] transportation to update her about this   Hypertension Interventions:  (Status:  Goal Met.) Short Term Goal Update low BPs see below 05/04/22 improved HTN - Goal met in  June 2023 Last practice recorded BP readings:  BP Readings from Last 3 Encounters:  03/10/22 101/66  02/12/22 (!) 112/51  12/30/21 (!) 112/51  Most recent eGFR/CrCl:  Lab Results  Component Value Date   EGFR 39 (L) 12/30/2021    No components found for: "CRCL"  Discussed plans with patient for ongoing care management follow up and provided  patient with direct contact information for care management team Discussed complications of poorly controlled blood pressure such as heart disease, stroke, circulatory complications, vision complications, kidney impairment, sexual dysfunction Screening for signs and symptoms of depression related to chronic disease state  Assessed social determinant of health barriers  Pain Interventions:  (Status:  Goal on track:  Yes.) Long Term Goal 05/04/22 continues to go to methadone clinic as scheduled 03/17/22 manageable 03/08/22 managing with home medicine regimen Pain assessment performed Medications reviewed Reviewed provider established plan for pain management Discussed importance of adherence to all scheduled medical appointments Counseled on the importance of reporting any/all new or changed pain symptoms or management strategies to pain management provider Screening for signs and symptoms of depression related to chronic disease state  Assessed social determinant of health barriers  Patient Goals/Self-Care Activities: Take all medications as prescribed Attend all scheduled provider appointments Attend church or other social activities Perform all self care activities independently  Call provider office for new concerns or questions  Go to Methadone clinic appointments Go to wound care management appointments Keep wound a clean as he can Call with transportation concerns Attend methadone clinic appointments 03/17/22 Established care with new pcp Called RN CM when noted conflict in appointments for next week. 03/21/22 Pt spoke with [facility] for home  health wound services and went to his wound care appointment with provided transportation but wound center staff did not view wound as wanted pt transported to them via stretcher.  Follow Up Plan:  The patient has been provided with contact information for the care management team and has been advised to call with any health related questions or concerns.  05/04/22 Pending complex care closure to community resources/agencies available. Discussed with pt & pcp RN CM on 05/04/22 as RN CM re assign to [facility] co pod services       SDOH assessments and interventions completed:  Yes     Care Coordination Interventions Activated:  Yes  Care Coordination Interventions:  Yes, provided   Follow up plan: Follow up call scheduled for 05/15/22 3:30 pm for possible closure    Encounter Outcome:  Pt. Visit Completed   Monzerrath Mcburney [name], RN, BSN, [place] Coordinator Office number [phone]

## 2022-05-04 NOTE — Telephone Encounter (Signed)
I received a call from Jackelyn Poling, RN CM/THN who explained that she is going to be closing the patient's case.  She has worked extensively with him to schedule appointments, arrange transportation as well as support services and assist with coordinating  patient's supply orders. .   The patient has phone numbers to arrange transportation to his appointments. Primarily he uses Medicaid transportation - Teacher, English as a foreign language. He does not want to use [employer]. He uses this transportation to medical appointments and methadone clinic ( Tues/ Wed).  As request of the [facility], he uses [facility] because he needs to arrive on a stretcher.   She was not able to find a home health agency that would accept his referral.  He has an aide in the home who has been performing the wound care. The wound care clinic is ordering the wound care supplies   He will need to have ostomy supplies ordered by PCP. I have scheduled him to see Ms Oletta Lamas, NP at [facility] on 8/28/203.  He may benefit from a referral to the [place] clinic. The following is the list of supplies that th patient prefers. He has been receiving samples from [place] said that these can be ordered through [employer].    81191 - Mio Click wE 47WG 95-62  [id number] 62952 - WUX324 Click opW mx w/w 40NU      [id number] - BRAVA ADHESIVE REMOVER WIPES      [id number] 12060 - BRAVA LUBRICATING DEODORANT; 0.27 OZ SACHET      [id number] 12076 - Brava Elastic Tape XL 30pcs  Warmest regards, [place] [address] Office: [phone] or toll [phone] Email:[email or web address]

## 2022-05-05 ENCOUNTER — Other Ambulatory Visit: Payer: Self-pay | Admitting: *Deleted

## 2022-05-05 NOTE — Patient Outreach (Signed)
  Care Coordination   Follow Up Visit Note   05/05/2022 Name: Konrad Hoak MRN: [id number] DOB: 10-25-1965  Tytus Lanni is a 56 y.o. year old male who sees [name], NP for primary care. I spoke with  Dray Librarian, academic by phone today  What matters to the patients health and wellness today?  His update of continued services for wound care, follow up appointments    Goals Addressed               This Visit's Progress     Patient Stated     Patient will be assisted to obtain home ostomy supplies [facility]) (pt-stated)   Not on track     Care Coordination Interventions: Evaluation of current treatment plan related to ostomy supply needs and patient's adherence to plan as established by provider Advised patient to find the specific coloplast items names, etc need to be used in MD order to be faxed to [facility] Discussed plans with patient for ongoing care management follow up and provided patient with direct contact information for care management team Screening for signs and symptoms of depression related to chronic disease state  Assessed social determinant of health barriers  Collaborate with pcp office , [facility], [place] RN and patient 05/05/22 updated pt on care coordination interventions, pending appointments, upcoming outreach, allowed pt to ventilate his feelings and concerns (continued services)       Patient will obtain a stretcher transported visit to wound care center [facility]) (pt-stated)   On track     Care Coordination Interventions: Discussed plans with patient for ongoing care management follow up and provided patient with direct contact information for care management team  Motivational Interviewing employed Depression screen reviewed  PHQ2/PHQ9 completed Solution-Focused Strategies employed:  Active listening / Reflection utilized  Emotional Support Provided Problem [place] strategies reviewed Verbalization of feelings encouraged  Discussed  caregiver resources and support: [facility] personal care aide + various in network home health united healthcare agencies Collaborated with Sylvester Harder, NP-pcp & staff, Dr Welton Flakes- wound care & staff- Holley Dexter, [facility] staff, [place] healthcare support staff [name] 05/05/22 updated pt on care coordination interventions, pending appointments, upcoming outreach, allowed pt to ventilate his feelings and concerns (continued services)        SDOH assessments and interventions completed:  No     Care Coordination Interventions Activated:  Yes  Care Coordination Interventions:  Yes, provided   Follow up plan: Follow up call scheduled for 05/15/22     Encounter Outcome:  Pt. Visit Completed   Mylene Bow [name], RN, BSN, [place] Coordinator Office number [phone]

## 2022-05-09 ENCOUNTER — Other Ambulatory Visit: Payer: Self-pay | Admitting: *Deleted

## 2022-05-09 NOTE — Patient Outreach (Signed)
  Care Coordination   Care coordination with transportation  Visit Note   05/09/2022 Name: [name] MRN: [id number] DOB: December 01, 1965  [name] is a 55 y.o. year old male who sees [name] Perna, NP for primary care. I spoke with  [name], academic by phone today  spoke with [name] at [place] to again discuss the stretcher transport for pt for 05/22/22 to [name] for wound care center appointment. Provided her all the information requested. She confirmed information entered  Spoke with [name] at [facility] DSS transportation line. She reports pt has been approved for the 05/18/22 transport but with [name] vs [name]. [name] to attempt to assist with changing to [name] as confirmed with the conference also including the patient.   What matters to the patients health and wellness today?  Follow up care coordination on transportation confirmations for upcoming appointments on 05/18/22 for urology and 05/22/22 for wound care center stretcher via [place]               This Visit's Progress     Patient Stated     Patient will obtain a stretcher transported visit to wound care center [facility]) (pt-stated)   On track     Care Coordination Interventions: Discussed plans with patient for ongoing care management follow up and provided patient with direct contact information for care management team  Motivational Interviewing employed Depression screen reviewed  PHQ2/PHQ9 completed Solution-Focused Strategies employed:  Active listening / Reflection utilized  Emotional Support Provided Problem [name] strategies reviewed Verbalization of feelings encouraged  Discussed caregiver resources and support: [facility] personal care aide + various in network home health [name] healthcare agencies Collaborated with [name] Harder, NP-pcp & staff, [name]- wound care & staff- [name], [name] staff, [name] healthcare support staff  [name] 05/05/22 updated pt on care coordination interventions, pending appointments, upcoming outreach, allowed pt to ventilate his feelings and concerns (continued services) 05/09/22 spoke with [name] staff [name] who states 05/22/22 stretcher transport scheduled to wound care center Pt updated. DSS transport to [facility] for urology visit conflict as pt prefers [name] vs assigned [name]. [name] to attempt to request [name]. Pt made aware he may need to use UHC coverage or find his own transport He voiced understanding         SDOH assessments and interventions completed:  No     Care Coordination Interventions Activated:  Yes  Care Coordination Interventions:  Yes, provided   Follow up plan: Follow up call scheduled for 05/15/22 1530    Encounter Outcome:  Pt. Visit Completed     [name] [name], RN, BSN, [place] Coordinator Office number [phone]

## 2022-05-12 ENCOUNTER — Other Ambulatory Visit: Payer: Self-pay | Admitting: *Deleted

## 2022-05-12 NOTE — Patient Outreach (Signed)
Care Coordination   Follow Up Visit Note   05/12/2022 Name: [name] MRN: [id number] DOB: 07-18-1966  [name] is a 56 y.o. year old male who sees [name], NP for primary care. I spoke with  [name], academic by phone today  What matters to the patients health and wellness today?  Coordination of transport to 05/12/22 urology (catheter change) appointment at [place] [facility] office Patient left a voice message for RN CM inquiring about his [facility]. RN CM returned the call to discuss with pt that RN CM had not worked with him on a transport for 05/12/22 to urology but had been informed of transports for 05/18/22 to urology in [facility]  & 05/22/[place] [facility] with Dr Welton Flakes. Clarified with pt that [facility] information is not accessible via [facility] at this time.  [place] Pt and personal care aide, [name] are taking local bus to local [facility] 05/12/22 appointment Outreach to [name] [phone]  to follow up on possible change of transport vendor from [facility] to [facility] for 05/18/22 Urology [facility] appointment and to schedule 05/29/22 follow pcp appointment transportation. Spoke with [name] Confirmed now scheduled with [facility] for 05/18/22.     Goals Addressed               This Visit's Progress     Patient Stated     Patient will obtain a stretcher transported visit to wound care center [facility]) (pt-stated)   On track     Care Coordination Interventions: Discussed plans with patient for ongoing care management follow up and provided patient with direct contact information for care management team  Motivational Interviewing employed Depression screen reviewed  PHQ2/PHQ9 completed Solution-Focused Strategies employed:  Active listening / Reflection utilized  Emotional Support Provided Problem [place] strategies reviewed Verbalization  of feelings encouraged  Discussed caregiver resources and support: [facility] personal care aide + various in network home health united healthcare agencies Collaborated with Sylvester Harder, NP-pcp & staff, Dr Welton Flakes- wound care & staff- Holley Dexter, [facility] staff, [place] healthcare support staff [name] 05/05/22 updated pt on care coordination interventions, pending appointments, upcoming outreach, allowed pt to ventilate his feelings and concerns (continued services) 05/09/22 spoke with [facility] staff [name] who states 05/22/22 stretcher transport scheduled to wound care center Pt updated. DSS transport to [facility] for urology visit conflict as pt prefers [facility] vs assigned [facility]. [name] to attempt to request [facility]. Pt made aware he may need to use UHC coverage or find his own transport He voiced understanding 05/12/22 confirmed with [name] at [facility] transportation [phone] pt is scheduled for transport via [name] to his 05/18/22 [facility] visit + 05/29/22 primary care provider office visit. Clarified with patient that 05/12/22 [facility] visit transportation not scheduled as RN CM unaware of need + [facility] office information is not accessible via [facility] [employer] at this time. Confirmed he and his personal care aide are able to access local bus services to get to the appointment  Sent online education on urinary catheter care (article & video) to his my chart site         SDOH assessments and interventions completed:  Yes  SDOH Interventions Today    Flowsheet Row Most Recent Value  SDOH Interventions   Financial Strain Interventions Intervention Not Indicated  Stress Interventions Intervention Not  Indicated  Social Connections Interventions Intervention Not Indicated        Care Coordination Interventions Activated:  Yes  Care Coordination Interventions:  Yes, provided   Follow up plan: Follow up call scheduled for 05/15/22     Encounter Outcome:  Pt. Visit Completed   [name] [name], RN, BSN, [place] Coordinator Office number [phone]

## 2022-05-12 NOTE — Patient Instructions (Addendum)
Visit Information  Thank you for taking time to visit with me today. Please don't hesitate to contact me if I can be of assistance to you.   Following are the goals we discussed today:   Goals Addressed               This Visit's Progress     Patient Stated     Patient will obtain a stretcher transported visit to wound care center [facility]) (pt-stated)   On track     Care Coordination Interventions: Discussed plans with patient for ongoing care management follow up and provided patient with direct contact information for care management team  Motivational Interviewing employed Depression screen reviewed  PHQ2/PHQ9 completed Solution-Focused Strategies employed:  Active listening / Reflection utilized  Emotional Support Provided Problem [place] strategies reviewed Verbalization of feelings encouraged  Discussed caregiver resources and support: [facility] personal care aide + various in network home health united healthcare agencies Collaborated with Sylvester Harder, NP-pcp & staff, Dr Welton Flakes- wound care & staff- Holley Dexter, [facility] staff, [place] healthcare support staff [name] 05/05/22 updated pt on care coordination interventions, pending appointments, upcoming outreach, allowed pt to ventilate his feelings and concerns (continued services) 05/09/22 spoke with [facility] staff [name] who states 05/22/22 stretcher transport scheduled to wound care center Pt updated. DSS transport to [facility] for urology visit conflict as pt prefers [facility] vs assigned [facility]. [name] to attempt to request [facility]. Pt made aware he may need to use UHC coverage or find his own transport He voiced understanding 05/12/22 confirmed with [name] at [facility] transportation [phone] pt is scheduled for transport via [name] to his 05/18/22 [facility] visit + 05/29/22 primary care provider office visit. Clarified with patient that 05/12/22 [facility]  transportation not scheduled as RN CM unaware of need + [facility] office information is not accessible via [facility] [employer] at this time. Confirmed he and his personal care aide are able to access local bus services to get to the appointment  Sent online education on urinary catheter care (article & video) to his my chart site         Our next appointment is by telephone on 05/15/22 at 1530  Please call the care guide team at [phone] if you need to cancel or reschedule your appointment.   If you are experiencing a Mental Health or [place] or need someone to talk to, please call the Suicide and Crisis Lifeline: 988 call the [place] National Suicide Prevention Lifeline: 517 144 1977 or TTY: (781)033-5523 TTY 8634797932) to talk to a trained counselor call 1-800-273-TALK (toll free, 24 hour hotline) go to [facility] [address] [place] [phone]) call 911   Patient verbalizes understanding of instructions and care plan provided today and agrees to view in [place]. Active MyChart status and patient understanding of how to access instructions and care plan via MyChart confirmed with patient.     The patient has been provided with contact information for the care management team and has been advised to call with any health related questions or concerns.   [place] Lavina Hamman, RN, BSN, [place] Coordinator Office number [phone]

## 2022-05-13 DIAGNOSIS — G4733 Obstructive sleep apnea (adult) (pediatric): Secondary | ICD-10-CM | POA: Diagnosis not present

## 2022-05-13 DIAGNOSIS — Z933 Colostomy status: Secondary | ICD-10-CM | POA: Diagnosis not present

## 2022-05-13 DIAGNOSIS — L89154 Pressure ulcer of sacral region, stage 4: Secondary | ICD-10-CM | POA: Diagnosis not present

## 2022-05-13 DIAGNOSIS — N319 Neuromuscular dysfunction of bladder, unspecified: Secondary | ICD-10-CM | POA: Diagnosis not present

## 2022-05-13 DIAGNOSIS — L89314 Pressure ulcer of right buttock, stage 4: Secondary | ICD-10-CM | POA: Diagnosis not present

## 2022-05-15 ENCOUNTER — Encounter: Payer: Self-pay | Admitting: *Deleted

## 2022-05-15 ENCOUNTER — Ambulatory Visit: Payer: Self-pay | Admitting: *Deleted

## 2022-05-15 IMAGING — CT CT ABD-PELV W/O CM
2 of 4 series · 16 of 46 positions shown, 18 images · non-contrast
Comparison: 10/13/2021

CLINICAL DATA: Abdominal pain, dysuria, hematuria



[Series 3: a/p w/o 5mm · axial · non-contrast · 0.98mm/px · z∈[+736,+1186]mm · 13 of 98 slices shown, 15 images]
[im 4/98  soft-tissue]
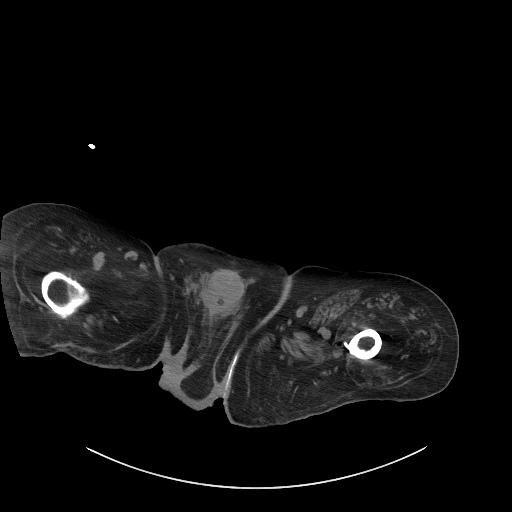
[im 4/98  bone]
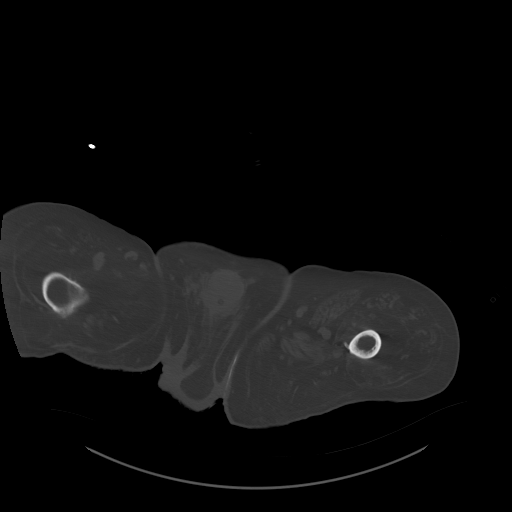
[im 12/98  soft-tissue]
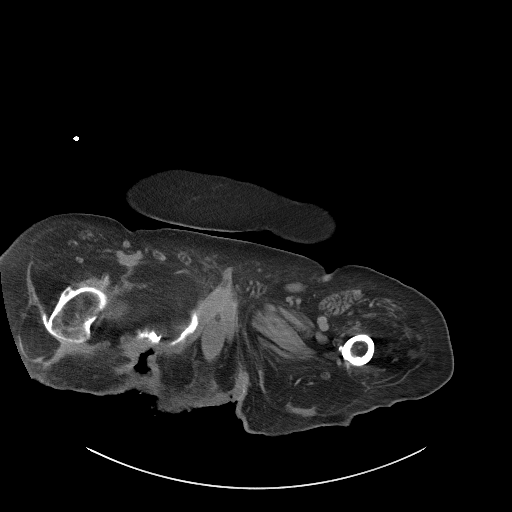
[im 19/98  soft-tissue]
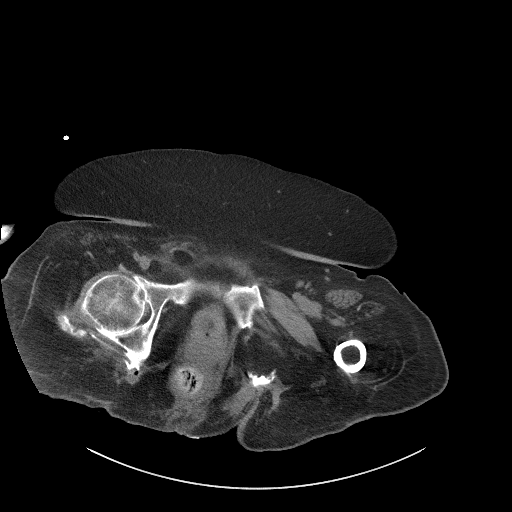
[im 27/98  soft-tissue]
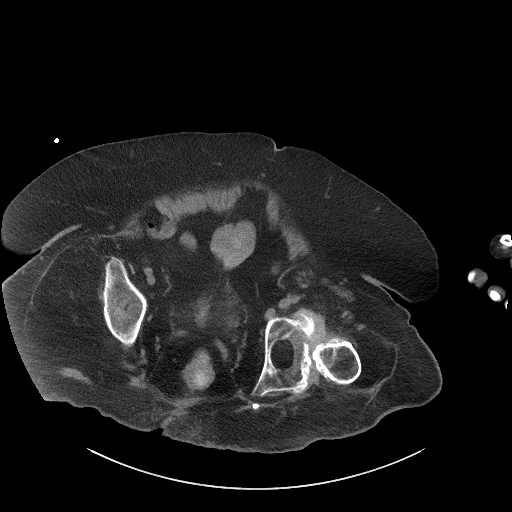
[im 34/98  soft-tissue]
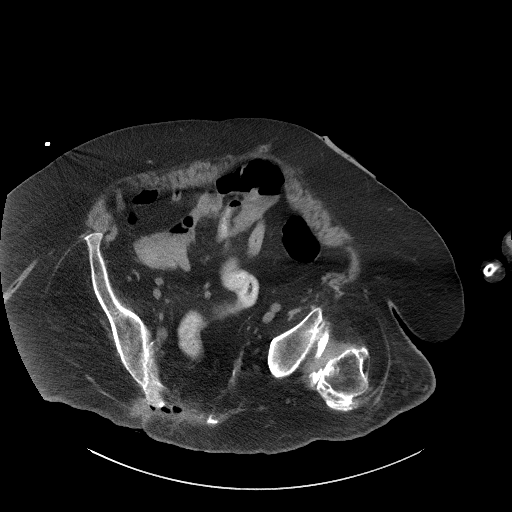
[im 42/98  soft-tissue]
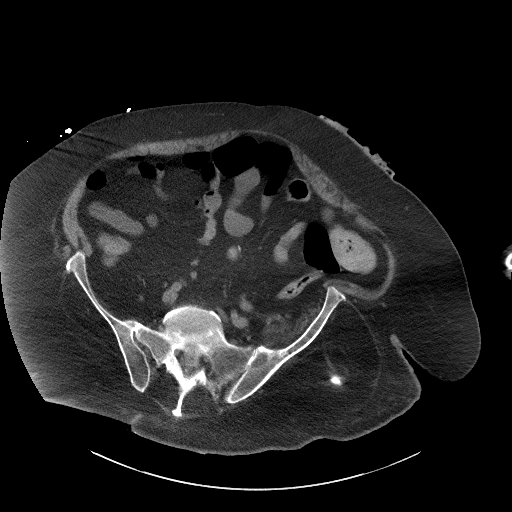
[im 49/98  soft-tissue]
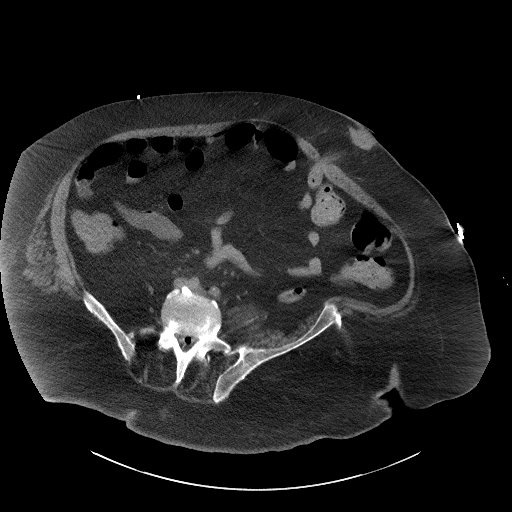
[im 56/98  soft-tissue]
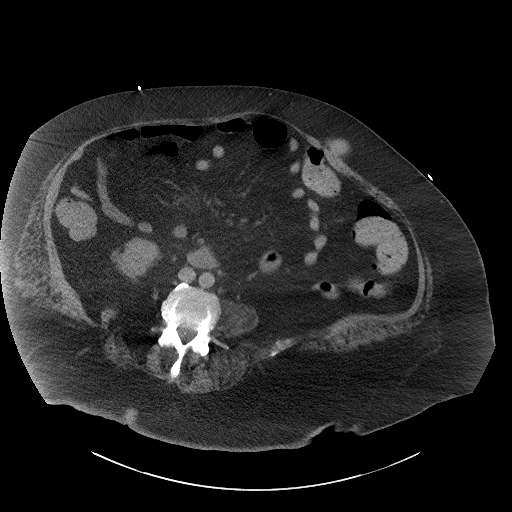
[im 64/98  soft-tissue]
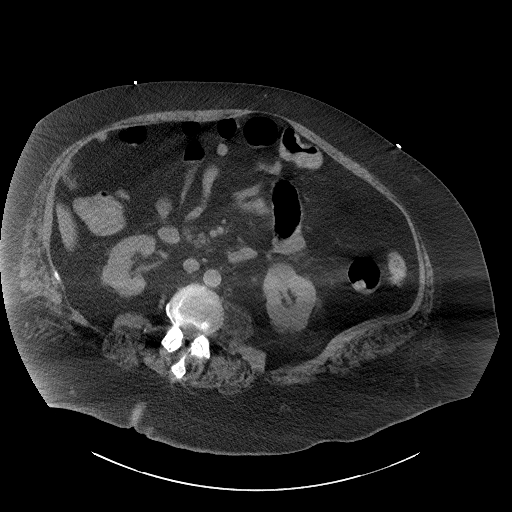
[im 64/98  bone]
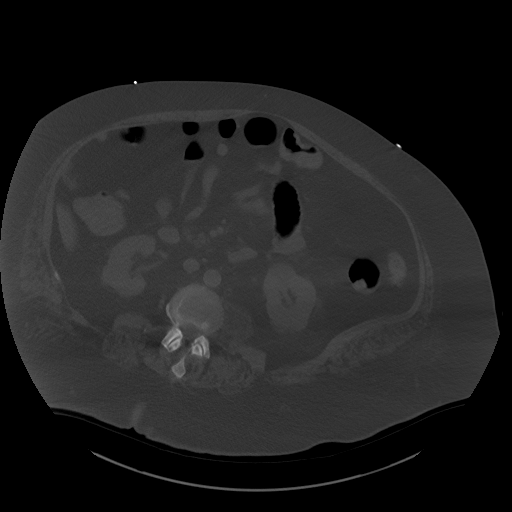
[im 71/98  soft-tissue]
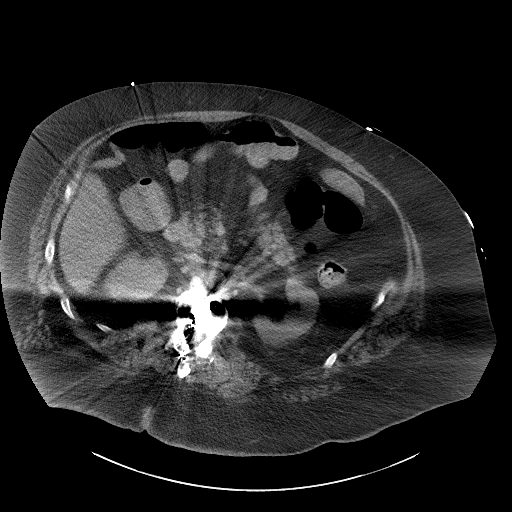
[im 79/98  soft-tissue]
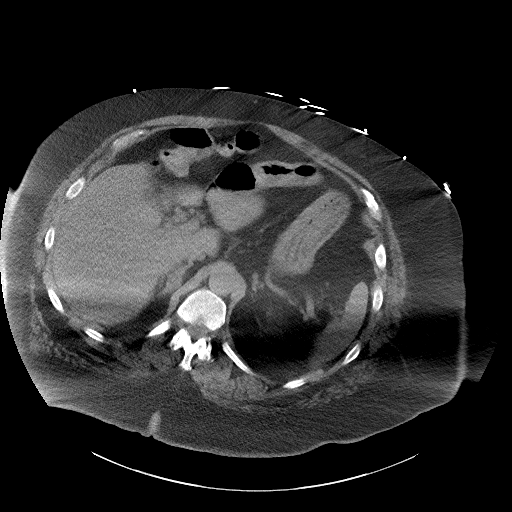
[im 86/98  soft-tissue]
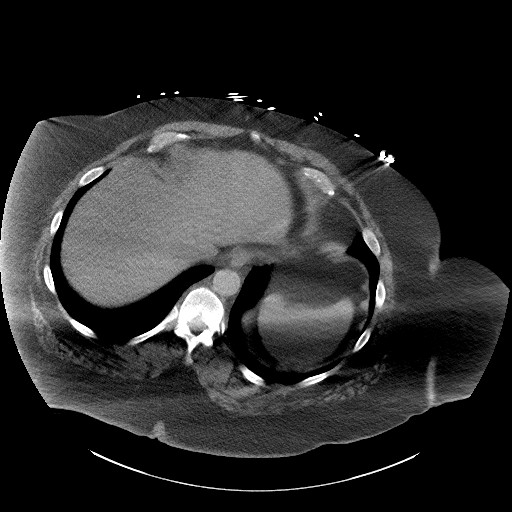
[im 94/98  soft-tissue]
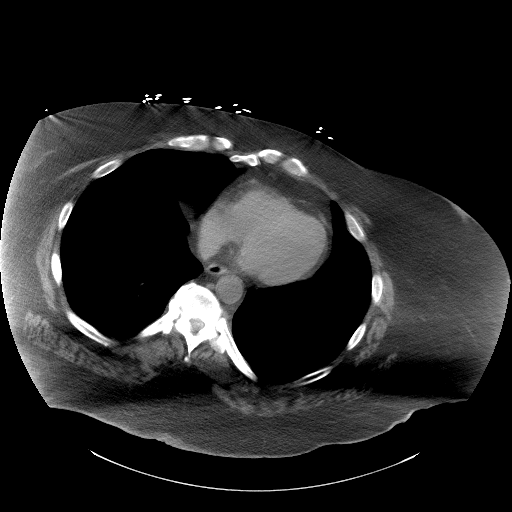

[Series 6: a/p w/o cor · coronal · non-contrast · 0.96mm/px · 3 of 178 slices shown]
[im 60/178  soft-tissue]
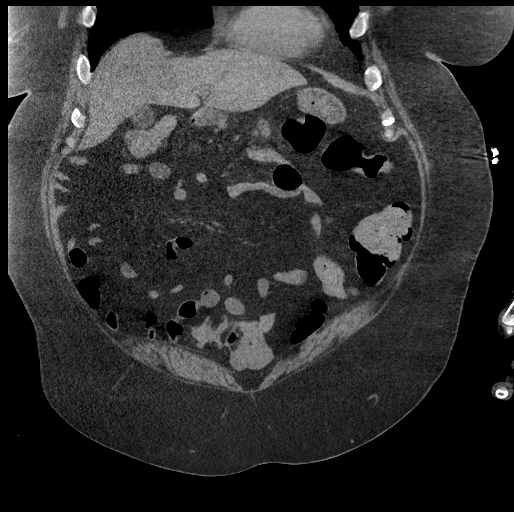
[im 79/178  soft-tissue]
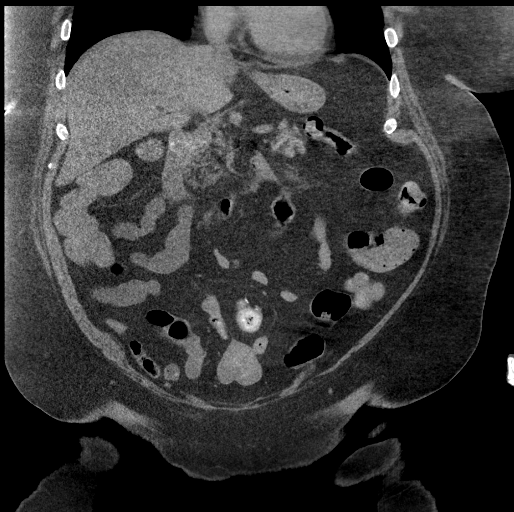
[im 99/178  soft-tissue]
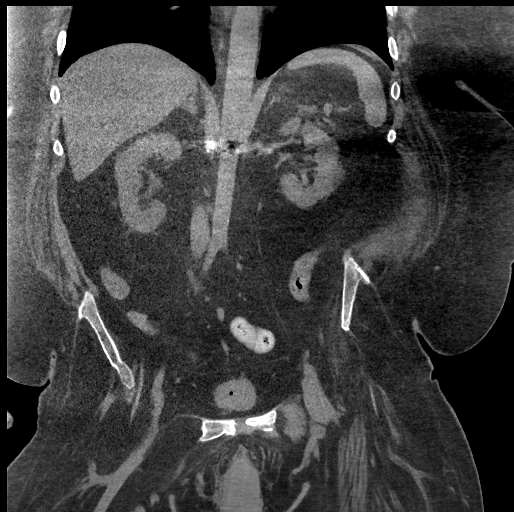

[16 of 46 positions shown; findings below may reference images not displayed]

FINDINGS: Lower chest: Unremarkable.

Hepatobiliary: No focal abnormality is seen in the liver. There is
no dilation of bile ducts. Gallbladder is unremarkable.

Pancreas: No focal abnormality is seen.

Spleen: Unremarkable.

Adrenals/Urinary Tract: There is nodularity in the right adrenal
with no significant interval change. There is no hydronephrosis.
There is 2.9 cm smooth marginated low-density lesion in the anterior
margin of left kidney which has not changed significantly.
Evaluation of this finding is limited in this noncontrast study.
Ureters are not dilated. Foley catheter is seen in the bladder.

Stomach/Bowel: Stomach is unremarkable. Small bowel loops are not
dilated. Appendix is not seen. Colostomy is seen in the left mid
abdomen. Surgical staples are seen in the sigmoid colon.

Vascular/Lymphatic: Unremarkable.

Reproductive: Prostate appears smaller than usual in size.

Other: There is no ascites pneumoperitoneum. There is marked atrophy
of right psoas. There is fatty infiltration in the left psoas. There
is chronic dislocation in the left hip. There is soft tissue defect
with pockets of air adjacent to the right side of lower sacrum.
Similar finding is noted along the posterior margin of ischial
tuberosity and posterior margin of proximal right femur. There is
interval increase in number of decubitus ulcers.

Musculoskeletal: There is 15 x 6 mm pocket of air posterior to the
upper endplate of body of L5 vertebra. There is no demonstrable
pockets of air in the adjacent disc space. Post surgical changes are
noted at L1-L2 level with no significant change.
IMPRESSION: There is no evidence intestinal obstruction or pneumoperitoneum.
There is no hydronephrosis. Colostomy is seen in the left mid
abdomen.

There are multiple decubitus ulcers adjacent to the right lower
sacrum right ischial tuberosity and proximal right femur. There is
interval increase in number and size of decubitus ulcers.

There is interval appearance of small pocket of air posterior to the
upper endplate of body L5 vertebra. This may be due to disc
degeneration or due to infectious process in the spinal canal.

Other findings as described in the body of the report.

## 2022-05-15 NOTE — Patient Outreach (Signed)
Care Coordination   Follow Up Visit Note   05/15/2022 Name: [name] MRN: [id number] DOB: [birth year]  [name] is a 56 y.o. year old male who sees [name], NP for primary care. I spoke with  [name], academic and his personal care aide, [name] by phone today  What matters to the patients health and wellness today?  Upcoming MD appointments with transportation, assisted to re-establish access to my chart    Goals Addressed               This Visit's Progress     Patient Stated     Patient will be assisted to obtain home ostomy supplies [facility]) (pt-stated)   Not on track     Care Coordination Interventions: Evaluation of current treatment plan related to ostomy supply needs and patient's adherence to plan as established by provider Advised patient to find the specific coloplast items names, etc need to be used in MD order to be faxed to [facility] Discussed plans with patient for ongoing care management follow up and provided patient with direct contact information for care management team Screening for signs and symptoms of depression related to chronic disease state  Assessed social determinant of health barriers  Collaborate with pcp office , [facility], [place] RN and patient 05/05/22 updated pt on care coordination interventions, pending appointments, upcoming outreach, allowed pt to ventilate his feelings and concerns (continued services) 05/12/22 has some samples, Pending 05/29/22 pcp visit for further orders for ostomy supplies       Patient will obtain a stretcher transported visit to wound care [name] [name]) (pt-stated)   On track     Care Coordination Interventions: Discussed plans with patient for ongoing care management follow up and provided patient with direct contact information for care management team  Motivational Interviewing employed Depression screen reviewed  PHQ2/PHQ9 completed Solution-Focused Strategies employed:  Active listening /  Reflection utilized  Emotional Support Provided Problem [place] strategies reviewed Verbalization of feelings encouraged  Discussed caregiver resources and support: [facility] personal care aide + various in network home health united healthcare agencies Collaborated with Sylvester Harder, NP-pcp & staff, Dr Welton Flakes- wound care & staff- Holley Dexter, [facility] staff, [place] healthcare support staff [name] 05/05/22 updated pt on care coordination interventions, pending appointments, upcoming outreach, allowed pt to ventilate his feelings and concerns (continued services) 05/09/22 spoke with [facility] staff [name] who states 05/22/22 stretcher transport scheduled to wound care [name] Pt updated. DSS transport to [facility] for urology visit conflict as pt prefers [facility] vs assigned [facility]. [name] to attempt to request [facility]. Pt made aware he may need to use UHC coverage or find his own transport He voiced understanding 05/12/22 confirmed with [name] at [name] transportation [phone] pt is scheduled for transport via [name] to his 05/18/22 [facility] visit + 05/29/22 primary care provider office visit. Clarified with patient that [facility]/12/22 [facility] not scheduled as RN CM unaware of need + [facility] office information is not accessible via [facility] [employer] at this time. Confirmed he and his personal care aide are able to access local bus services to get to the appointment  Sent online education on urinary catheter care (article & video) to his my chart site 05/15/22 reviewed all August 2023 appointments with scheduled transportation services. Assisted to re establish access to my chart. Encouraged him to get familiar with my chart access. Discussed potential re assignment  of RN CM services, encouragement provided         SDOH assessments and interventions completed:  Yes  SDOH Interventions Today    Flowsheet Row Most Recent  Value  SDOH Interventions   Food Insecurity Interventions Intervention Not Indicated  Financial Strain Interventions Intervention Not Indicated  Housing Interventions Intervention Not Indicated  Physical Activity Interventions Other (Comments)  [paraplegic]        Care Coordination Interventions Activated:  Yes  Care Coordination Interventions:  Yes, provided   Follow up plan: Follow up call scheduled for 06/02/22 4 pm    Encounter Outcome:  Pt. Visit Completed   [name] L. [name] Hamman, RN, BSN, [place] Coordinator Office number [phone]

## 2022-05-15 NOTE — Patient Instructions (Signed)
Visit Information  Thank you for taking time to visit with me today. Please don't hesitate to contact me if I can be of assistance to you.   Following are the goals we discussed today:   Goals Addressed               This Visit's Progress     Patient Stated     Patient will be assisted to obtain home ostomy supplies [facility]) (pt-stated)   Not on track     Care Coordination Interventions: Evaluation of current treatment plan related to ostomy supply needs and patient's adherence to plan as established by provider Advised patient to find the specific coloplast items names, etc need to be used in MD order to be faxed to [employer] Discussed plans with patient for ongoing care management follow up and provided patient with direct contact information for care management team Screening for signs and symptoms of depression related to chronic disease state  Assessed social determinant of health barriers  Collaborate with pcp office , [employer], [place] RN and patient 05/05/22 updated pt on care coordination interventions, pending appointments, upcoming outreach, allowed pt to ventilate his feelings and concerns (continued services) 05/12/22 has some samples, Pending 05/29/22 pcp visit for further orders for ostomy supplies       Patient will obtain a stretcher transported visit to wound care center [facility]) (pt-stated)   On track     Care Coordination Interventions: Discussed plans with patient for ongoing care management follow up and provided patient with direct contact information for care management team  Motivational Interviewing employed Depression screen reviewed  PHQ2/PHQ9 completed Solution-Focused Strategies employed:  Active listening / Reflection utilized  Emotional Support Provided Problem [name] strategies reviewed Verbalization of feelings encouraged  Discussed caregiver resources and support: [facility] personal care aide + various in network home health united  healthcare agencies Collaborated with Sylvester Harder, NP-pcp & staff, Dr Welton Flakes- wound care & staff- Holley Dexter, [facility] [employer] staff, [place] healthcare support staff [name] 05/05/22 updated pt on care coordination interventions, pending appointments, upcoming outreach, allowed pt to ventilate his feelings and concerns (continued services) 05/09/22 spoke with [facility] staff [name] who states 05/22/22 stretcher transport scheduled to wound care center Pt updated. DSS transport to [facility] for urology visit conflict as pt prefers [facility] vs assigned [employer]. [name] to attempt to request [facility]. Pt made aware he may need to use [employer] coverage or find his own transport He voiced understanding 05/12/22 confirmed with [name] at [facility] transportation [phone] pt is scheduled for transport via [name] to his 05/18/22 [facility] visit + 05/29/22 primary care provider office visit. Clarified with patient that 05/12/22 [facility] not scheduled as RN CM unaware of need + [facility] office information is not accessible via [facility] [employer] at this time. Confirmed he and his personal care aide are able to access local bus services to get to the appointment  Sent online education on urinary catheter care (article & video) to his my chart site 05/15/22 reviewed all August 2023 appointments with scheduled transportation services. Assisted to re establish access to my chart. Encouraged him to get familiar with my chart access. Discussed potential re assignment of RN CM services, encouragement provided         Our next appointment is by telephone on 06/02/22 at 4 pm  Please call the care guide team at [phone] if you need to cancel or  reschedule your appointment.   If you are experiencing a Mental Health or [place] or need someone to talk to, please call the Suicide and Crisis Lifeline: 988 call the [place] National Suicide Prevention Lifeline:  506-801-8716 or TTY: 4802091079 TTY 223-878-2592) to talk to a trained counselor call 1-800-273-TALK (toll free, 24 hour hotline) go to [facility] [address] [place] [phone]) call 911   Patient verbalizes understanding of instructions and care plan provided today and agrees to view in [place]. Active MyChart status and patient understanding of how to access instructions and care plan via MyChart confirmed with patient.     The patient has been provided with contact information for the care management team and has been advised to call with any health related questions or concerns.   [place] Lavina Hamman, RN, BSN, [place] Coordinator Office number [phone]

## 2022-05-16 ENCOUNTER — Other Ambulatory Visit: Payer: Self-pay | Admitting: *Deleted

## 2022-05-16 NOTE — Patient Outreach (Signed)
  Care Coordination   Care coordination outreach  Visit Note   05/16/2022 Name: Sharod Petsch MRN: [id number] DOB: 11-02-1965  Drequan Gappa is a 56 y.o. year old male who sees [name], NP for primary care. I  spoke with [name] at Dr Diona Fanti urology office   What matters to the patients health and wellness today?  Cancel conflicting 6/44/03 [facility] appointment He has a [facility] appointment & urology surgery with Dr Francesca Jewett on 06/16/22   Goals Addressed   None     SDOH assessments and interventions completed:  No     Care Coordination Interventions Activated:  Yes  Care Coordination Interventions:  Yes, provided   Follow up plan: Follow up call scheduled for 06/02/22 4 pm    Encounter Outcome:  Pt. Visit Completed    Shavontae Gibeault [name], RN, BSN, [place] Coordinator Office number [phone]

## 2022-05-18 DIAGNOSIS — Z01818 Encounter for other preprocedural examination: Secondary | ICD-10-CM | POA: Diagnosis not present

## 2022-05-22 ENCOUNTER — Other Ambulatory Visit: Payer: Self-pay | Admitting: *Deleted

## 2022-05-22 DIAGNOSIS — L89154 Pressure ulcer of sacral region, stage 4: Secondary | ICD-10-CM | POA: Diagnosis not present

## 2022-05-22 NOTE — Patient Outreach (Signed)
  Care Coordination   Care coordination  Visit Note   05/22/2022 Name: [name] MRN: [id number] DOB: [birth year]  [name] is a 56 y.o. year old male who sees [name] Perna, NP for primary care. I spoke with  [name], academic by phone today  What matters to the patients health and wellness today?  Transportation arrival for scheduled wound center appointment today    Goals Addressed   None     SDOH assessments and interventions completed:  No     Care Coordination Interventions Activated:  Yes  Care Coordination Interventions:  Yes, provided   Follow up plan: Follow up call scheduled for 06/02/22    Encounter Outcome:  Pt. Visit Completed   [name] L. [name] Hamman, RN, BSN, [place] Coordinator Office number [phone]

## 2022-05-22 NOTE — Patient Instructions (Signed)
Visit Information  Thank you for taking time to visit with me today. Please don't hesitate to contact me if I can be of assistance to you.   These are the numbers for your transportation services [facility] [place] [employer]- [phone] [employer] [phone]  Following are the goals we discussed today:   Goals Addressed               This Visit's Progress     Patient Stated     Patient will obtain a stretcher transported visit to wound care center [facility]) (pt-stated)   On track     Care Coordination Interventions: Discussed plans with patient for ongoing care management follow up and provided patient with direct contact information for care management team  Motivational Interviewing employed Depression screen reviewed  PHQ2/PHQ9 completed Solution-Focused Strategies employed:  Active listening / Reflection utilized  Emotional Support Provided Problem [place] strategies reviewed Verbalization of feelings encouraged  05/22/22 care coordination completed after patient left message inquiring about the arrival of his [facility] transportation to his wound care visit today. [facility] called to confirm the transport was on the way and patient updated of this.         Our next appointment is by telephone on 06/02/22 at 4 pm  Please call the care guide team at [phone] if you need to cancel or reschedule your appointment.   If you are experiencing a Mental Health or [place] or need someone to talk to, please call the Suicide and Crisis Lifeline: 988 call the [place] National Suicide Prevention Lifeline: (217)572-5888 or TTY: (506) 475-7461 TTY 505-095-1585) to talk to a trained counselor call 1-800-273-TALK (toll free, 24 hour hotline) go to [facility] [address], [place] [phone]) call 911   Patient verbalizes understanding of instructions and care plan provided today and agrees to view in  [place]. Active MyChart status and patient understanding of how to access instructions and care plan via MyChart confirmed with patient.     The patient has been provided with contact information for the care management team and has been advised to call with any health related questions or concerns.   [place] Lavina Hamman, RN, BSN, [place] Coordinator Office number [phone]

## 2022-05-29 ENCOUNTER — Encounter (INDEPENDENT_AMBULATORY_CARE_PROVIDER_SITE_OTHER): Payer: Self-pay | Admitting: Primary Care

## 2022-05-29 ENCOUNTER — Telehealth: Payer: Self-pay

## 2022-05-29 ENCOUNTER — Ambulatory Visit (INDEPENDENT_AMBULATORY_CARE_PROVIDER_SITE_OTHER): Payer: Medicare Other | Admitting: Primary Care

## 2022-05-29 VITALS — BP 115/70 | HR 80 | Temp 98.0°F

## 2022-05-29 DIAGNOSIS — Z933 Colostomy status: Secondary | ICD-10-CM | POA: Diagnosis not present

## 2022-05-29 DIAGNOSIS — Z23 Encounter for immunization: Secondary | ICD-10-CM

## 2022-05-29 DIAGNOSIS — L89313 Pressure ulcer of right buttock, stage 3: Secondary | ICD-10-CM | POA: Diagnosis not present

## 2022-05-29 NOTE — Patient Instructions (Signed)

## 2022-05-29 NOTE — Telephone Encounter (Signed)
I called [employer]  # [phone] regarding placing an order for ostomy supplies.  I spoke to [facility] who said that they do not have an account set up for the patient yet.  The order for supplies along with demographic information and clinical note can be faxed to [phone]
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
## 2022-05-31 ENCOUNTER — Encounter: Payer: Self-pay | Admitting: *Deleted

## 2022-05-31 ENCOUNTER — Other Ambulatory Visit: Payer: Self-pay | Admitting: *Deleted

## 2022-05-31 NOTE — Telephone Encounter (Signed)
Faxed all documents

## 2022-05-31 NOTE — Patient Instructions (Signed)
Visit Information  Thank you for taking time to visit with me today. Please don't hesitate to contact me if I can be of assistance to you.   Following are the goals we discussed today:   Goals Addressed               This Visit's Progress     Patient Stated     Patient will be assisted to obtain home ostomy supplies [name]) (pt-stated)   On track     Care Coordination Interventions: Evaluation of current treatment plan related to ostomy supply needs and patient's adherence to plan as established by provider Discussed plans with patient for ongoing care management follow up and provided patient with direct contact information for care management team Screening for signs and symptoms of depression related to chronic disease state  Assessed social determinant of health barriers Confirmed patient attended 05/29/22 pcp office visit and has been referred to the ostomy clinic for ostomy supply assistance        Patient will obtain a stretcher transported visit to wound care center + other MD visit transportation [facility]) (pt-stated)   On track     Care Coordination Interventions: Advised patient to call [place] today 06/01/22 to provide needed medicaid update information to keep his transportation services active as stated by Alda Berthold when RN CM attempted to schedule the 06/13/22 urology transport he requested on 05/30/22 Discussed plans with patient for ongoing care management follow up and provided patient with direct contact information for care management team Assessed social determinant of health barriers Provided him and his personal care aide, [name] with the [place] number [place] next appointment is by telephone on 06/02/22 at 230 pm  Please call the care guide team at [phone] if you need to cancel or reschedule your appointment.   If you are experiencing a Mental Health or [place] or need someone to talk to, please call the  Suicide and Crisis Lifeline: 988 call the [place] National Suicide Prevention Lifeline: 562 227 9261 or TTY: 810-081-3110 TTY (251) 179-7358) to talk to a trained counselor call 1-800-273-TALK (toll free, 24 hour hotline) go to [facility] Care [address], [place] [phone]) call 911   Patient verbalizes understanding of instructions and care plan provided today and agrees to view in [place]. Active MyChart status and patient understanding of how to access instructions and care plan via MyChart confirmed with patient.     The patient has been provided with contact information for the care management team and has been advised to call with any health related questions or concerns.  The [facility] team will follow up with the patient and will provide direct communication to the PCP for this patient.   [place] Lavina Hamman, RN, BSN, [place] Coordinator Office number [phone]

## 2022-05-31 NOTE — Patient Outreach (Signed)
  Care Coordination   Care coordination  Visit Note   05/31/2022 Name: Brailen Macneal MRN: [id number] DOB: 1965-12-01  Susumu Mejorado is a 56 y.o. year old male who sees [name], NP for primary care. I  outreached to [employer] transportation services and spoke with   What matters to the patients health and wellness today?  Transportation to [facility] 06/13/22 pre surgical appointment pending a  Patient call to DSS to update medicaid information by 06/02/22 per Alda Berthold He states he will complete this today He is aware that Alda Berthold states it needs to be completed by 06/02/22 He will call RN CM if assistance is needed    Goals Addressed               This Visit's Progress     Patient Stated     Patient will be assisted to obtain home ostomy supplies [facility]) (pt-stated)   On track     Care Coordination Interventions: Evaluation of current treatment plan related to ostomy supply needs and patient's adherence to plan as established by provider Discussed plans with patient for ongoing care management follow up and provided patient with direct contact information for care management team Screening for signs and symptoms of depression related to chronic disease state  Assessed social determinant of health barriers Confirmed patient attended 05/29/22 pcp office visit and has been referred to the ostomy clinic for ostomy supply assistance        Patient will obtain a stretcher transported visit to wound care center + other MD visit transportation [facility]) (pt-stated)   On track     Care Coordination Interventions: Advised patient to call [place] today 06/01/22 to provide needed medicaid update information to keep his transportation services active as stated by Alda Berthold when RN CM attempted to schedule the 06/13/22 urology transport he requested on 05/30/22 Discussed plans with patient for ongoing care management follow up and provided patient with direct contact information for care  management team Assessed social determinant of health barriers Provided him and his personal care aide, [name] with the [place] number [phone]        SDOH assessments and interventions completed:  No  SDOH Interventions Today    Flowsheet Row Most Recent Value  SDOH Interventions   Stress Interventions Intervention Not Indicated  Social Connections Interventions Intervention Not Indicated  Transportation Interventions Intervention Not Indicated        Care Coordination Interventions Activated:  Yes   Care Coordination Interventions:  Yes, provided     Follow up plan: Follow up call scheduled for 06/02/22     Encounter Outcome:  Pt. Visit Completed   [place] [name], RN, BSN, [place] Coordinator Office number [phone]

## 2022-06-01 ENCOUNTER — Other Ambulatory Visit: Payer: Self-pay | Admitting: *Deleted

## 2022-06-01 NOTE — Patient Instructions (Signed)
Visit Information  Thank you for taking time to visit with me today. Please don't hesitate to contact me if I can be of assistance to you.   Following are the goals we discussed today:   Goals Addressed               This Visit's Progress     Patient Stated     COMPLETED: Patient will be assisted to obtain home ostomy supplies [facility]) (pt-stated)   On track     Care Coordination Interventions: Evaluation of current treatment plan related to ostomy supply needs and patient's adherence to plan as established by provider Discussed plans with patient for ongoing care management follow up and provided patient with direct contact information for care management team Screening for signs and symptoms of depression related to chronic disease state  Assessed social determinant of health barriers Confirmed patient attended 05/29/22 pcp office visit and has been referred to the ostomy clinic for ostomy supply assistance   Patient called to get assist with transport for a 06/02/2022 9:30 AM ostomy clinic appointment but it is to late to schedule a medicaid transportation ride (rides have to be called in 2-3 days prior to the appointment)  conference call with patient to leave a voice message at the ostomy clinic requesting to reschedule and sent a secure message to [name] WOC. With response from [name], he was assisted to reschedule to a 06/09/22 1000 appointment. Spoke with Butch Penny at [phone] to attempt to get schedule transportation. She was unable to find Mr [place] updated assessment he completed with Kisha on 05/31/22 but could confirm Alda Berthold made a 06/13/22 transportation ride for Mr Botto (the ride could not have been made if the assessment was not completed) DSS requests a return call        Our next appointment is by telephone on 06/02/22 at 2:30 pm  Please call the care guide team at [phone] if you need to cancel or reschedule your appointment.   If you are experiencing a  Mental Health or [place] or need someone to talk to, please call the Suicide and Crisis Lifeline: 988 call the [place] National Suicide Prevention Lifeline: 6157155405 or TTY: (254)572-6308 TTY (213) 220-6474) to talk to a trained counselor call 1-800-273-TALK (toll free, 24 hour hotline) go to [facility] [address], [place] [phone]) call 911   Patient verbalizes understanding of instructions and care plan provided today and agrees to view in [place]. Active MyChart status and patient understanding of how to access instructions and care plan via MyChart confirmed with patient.     The patient has been provided with contact information for the care management team and has been advised to call with any health related questions or concerns.   [place] Lavina Hamman, RN, BSN, [place] Coordinator Office number [phone]

## 2022-06-01 NOTE — Patient Outreach (Signed)
  Care Coordination   Care coordination   Visit Note   06/01/2022 Name: [name] MRN: [id number] DOB: March 11, 1966  [name] is a 56 y.o. year old male who sees [name], NP for primary care. I spoke with  [name], academic by phone today.  What matters to the patients health and wellness today?  New 06/02/22 ostomy clinic appointment with DSS transportation  This had to be reschedule to 06/09/22 with [name] to allow patient to secure his medicaid transportation     Goals Addressed               This Visit's Progress     Patient Stated     COMPLETED: Patient will be assisted to obtain home ostomy supplies [facility]) (pt-stated)   On track     Care Coordination Interventions: Evaluation of current treatment plan related to ostomy supply needs and patient's adherence to plan as established by provider Discussed plans with patient for ongoing care management follow up and provided patient with direct contact information for care management team Screening for signs and symptoms of depression related to chronic disease state  Assessed social determinant of health barriers Confirmed patient attended 05/29/22 pcp office visit and has been referred to the ostomy clinic for ostomy supply assistance   Patient called to get assist with transport for a 06/02/2022 9:30 AM ostomy clinic appointment but it is to late to schedule a medicaid transportation ride (rides have to be called in 2-3 days prior to the appointment)  conference call with patient to leave a voice message at the ostomy clinic requesting to reschedule and sent a secure message to [name] WOC. With response from [name], he was assisted to reschedule to a 06/09/22 1000 appointment. Spoke with [name] at [phone] to attempt to get schedule transportation. She was unable to find [name] updated assessment he completed with [name] on 05/31/22 but could confirm [name] made a 06/13/22 transportation ride for [name]  [name] (the ride could not have been made if the assessment was not completed) DSS requests a return call        SDOH assessments and interventions completed:  No     Care Coordination Interventions Activated:  Yes  Care Coordination Interventions:  Yes, provided   Follow up plan: Follow up call scheduled for 06/02/22    Encounter Outcome:  Pt. Visit Completed   [name] [name], RN, BSN, [place] Coordinator Office number [phone]

## 2022-06-02 ENCOUNTER — Ambulatory Visit (HOSPITAL_COMMUNITY): Payer: Medicare Other | Admitting: Nurse Practitioner

## 2022-06-02 ENCOUNTER — Ambulatory Visit: Payer: Self-pay | Admitting: *Deleted

## 2022-06-02 NOTE — Patient Outreach (Signed)
  Care Coordination   Follow Up Visit Note   06/02/2022 Name: [name] MRN: [id number] DOB: 01-27-1966  [name] is a 56 y.o. year old male who sees [name], NP for primary care. I spoke with  Teofil Librarian, academic by phone today.  What matters to the patients health and wellness today?  Medicaid transportation to ostomy clinic 06/09/22, to Dr Francesca Jewett at [facility] on 06/16/22 & Dr Welton Flakes at the wound care clinic on 06/1822  Confirmed his DSS transportation assessment was in [employer] and he was scheduled for a 06/13/22 pre op urology transport   Goals Addressed               This Visit's Progress     Patient Stated     COMPLETED: Patient will be assisted to obtain home ostomy supplies [facility]) (pt-stated)        Care Coordination Interventions: Discussed plans with patient for ongoing care management follow up and provided patient with direct contact information for care management team Assisted patient to get a 06/09/22 medicaid transport to [place] ostomy clinic with [facility] [phone]        Patient will obtain a stretcher transported visit to wound care center + other MD visit transportation [facility]) (pt-stated)   On track     Care Coordination Interventions: Discussed plans with patient for ongoing care management follow up and provided patient with direct contact information for care management team Assisted to call PTAR. [phone] Spoke with Dreama to schedule his 06/19/22 ambulance stretcher ride and  spoke with DSS [employer] [phone]        SDOH assessments and interventions completed:  No     Care Coordination Interventions Activated:  Yes  Care Coordination Interventions:  Yes, provided   Follow up plan: Follow up call scheduled for 06/06/22    Encounter Outcome:  Pt. Visit Completed   Leinani Lisbon [name], RN, BSN, [place] Coordinator Office number [phone]

## 2022-06-02 NOTE — Patient Instructions (Addendum)
Visit Information  Thank you for taking time to visit with me today. Please don't hesitate to contact me if I can be of assistance to you.   Following are the goals we discussed today:   Goals Addressed               This Visit's Progress     Patient Stated     COMPLETED: Patient will be assisted to obtain home ostomy supplies [facility]) (pt-stated)        Care Coordination Interventions: Discussed plans with patient for ongoing care management follow up and provided patient with direct contact information for care management team Assisted patient to get a 06/09/22 medicaid transport to [place] ostomy clinic with [facility] [phone]        Patient will obtain a stretcher transported visit to wound care center + other MD visit transportation [facility]) (pt-stated)   On track     Care Coordination Interventions: Discussed plans with patient for ongoing care management follow up and provided patient with direct contact information for care management team Assisted to call PTAR. [phone] Spoke with Dreama to schedule his 06/19/22 ambulance stretcher ride and  spoke with [place] next appointment is by telephone on 06/23/22 at 1:30 pm  Please call the care guide team at [phone] if you need to cancel or reschedule your appointment.   If you are experiencing a Mental Health or [place] or need someone to talk to, please call the Suicide and Crisis Lifeline: 988 call the [place] National Suicide Prevention Lifeline: 952 733 8636 or TTY: 939-805-6420 TTY 660-871-4767) to talk to a trained counselor call 1-800-273-TALK (toll free, 24 hour hotline) go to [facility] [address], [place] [phone]) call 911   Patient verbalizes understanding of instructions and care plan provided today and agrees to view in [place]. Active MyChart status and patient understanding of how to access instructions and care  plan via MyChart confirmed with patient.     The patient has been provided with contact information for the care management team and has been advised to call with any health related questions or concerns.   Mel[place] Lavina Hamman, RN, BSN, [place] Coordinator Office number [phone]

## 2022-06-06 ENCOUNTER — Encounter: Payer: Medicare Other | Admitting: *Deleted

## 2022-06-09 ENCOUNTER — Encounter (HOSPITAL_COMMUNITY)
Admission: RE | Admit: 2022-06-09 | Discharge: 2022-06-09 | Disposition: A | Discharge status: Home / Self Care | Payer: Medicare Other | Source: Ambulatory Visit | Attending: Nurse Practitioner | Admitting: Nurse Practitioner

## 2022-06-09 ENCOUNTER — Other Ambulatory Visit (HOSPITAL_COMMUNITY): Payer: Self-pay | Admitting: Nurse Practitioner

## 2022-06-09 DIAGNOSIS — Z933 Colostomy status: Secondary | ICD-10-CM | POA: Diagnosis not present

## 2022-06-09 DIAGNOSIS — K5903 Drug induced constipation: Secondary | ICD-10-CM | POA: Diagnosis not present

## 2022-06-09 DIAGNOSIS — L89154 Pressure ulcer of sacral region, stage 4: Secondary | ICD-10-CM | POA: Insufficient documentation

## 2022-06-09 DIAGNOSIS — K5909 Other constipation: Secondary | ICD-10-CM | POA: Insufficient documentation

## 2022-06-09 DIAGNOSIS — G8220 Paraplegia, unspecified: Secondary | ICD-10-CM | POA: Insufficient documentation

## 2022-06-09 DIAGNOSIS — K9400 Colostomy complication, unspecified: Secondary | ICD-10-CM | POA: Insufficient documentation

## 2022-06-09 MED ORDER — MINERAL OIL RE ENEM: 1.0000 | ENEMA | Freq: Once | RECTAL | 0 refills | Status: AC

## 2022-06-09 NOTE — Discharge Instructions (Signed)
Discussed using cone enema through stoma Pick up enema liquid to add to gravity bag at pharmacy.

## 2022-06-09 NOTE — Progress Notes (Signed)
[facility]   Reason for visit:  LLQ colostomy  chronic constipation HPI:  Paraplegia  stage 4 sacral pressure injury, nonhealing ROS  Review of Systems  Gastrointestinal:  Negative for constipation.       LLQ colostomy  Musculoskeletal:        Paraplegia  Skin:  Positive for color change and wound.       Darkened peristomal skin Stage 4 sacral pressur einjury   All other systems reviewed and are negative.  Vital signs:  BP (!) 112/57   Pulse 77   Temp 97.9 F (36.6 C)   Resp 18   SpO2 97%  Exam:  Physical Exam Constitutional:      Appearance: He is obese.  Abdominal:     Comments: Firm abdomen, sluggish bowel sounds.   Neurological:     Mental Status: He is alert.     Stoma type/location:  LLQ colosotmy Stomal assessment/size:  well budded pink and moist 1 3/4"  Peristomal assessment:  intact Treatment options for stomal/peristomal skin: 2 piece pouch, barrier ring Output: thick brown stool Ostomy pouching: 2pc. 2 3/4" with barrier ring Education provided:  Patient continues to struggle with ongoing constipation.  Taking a variety of stimulant laxatives, stool softeners, and fiber pills. Drinks moderate amounts of water and juice, he reports    Impression/dx  Colostomy constipation Discussion  States he had large bowel movement yesterday and does not feel like he needs an enema today.  I provide him with an enema kit and demonstrate how to administer vis stoma using cone .  His paid caregiver is with him today.  Plan  Called in SMOG enema to [facility] and provided enema kit with irrigation bag.     Visit time: 45 minutes.   [name] FNP-BC

## 2022-06-13 DIAGNOSIS — G4733 Obstructive sleep apnea (adult) (pediatric): Secondary | ICD-10-CM | POA: Diagnosis not present

## 2022-06-13 DIAGNOSIS — G8220 Paraplegia, unspecified: Secondary | ICD-10-CM | POA: Diagnosis not present

## 2022-06-13 DIAGNOSIS — Z933 Colostomy status: Secondary | ICD-10-CM | POA: Diagnosis not present

## 2022-06-13 DIAGNOSIS — N319 Neuromuscular dysfunction of bladder, unspecified: Secondary | ICD-10-CM | POA: Diagnosis not present

## 2022-06-13 DIAGNOSIS — L89154 Pressure ulcer of sacral region, stage 4: Secondary | ICD-10-CM | POA: Diagnosis not present

## 2022-06-13 DIAGNOSIS — L89314 Pressure ulcer of right buttock, stage 4: Secondary | ICD-10-CM | POA: Diagnosis not present

## 2022-06-13 DIAGNOSIS — I10 Essential (primary) hypertension: Secondary | ICD-10-CM | POA: Diagnosis not present

## 2022-06-13 DIAGNOSIS — K219 Gastro-esophageal reflux disease without esophagitis: Secondary | ICD-10-CM | POA: Diagnosis not present

## 2022-06-14 ENCOUNTER — Other Ambulatory Visit: Payer: Self-pay | Admitting: Student

## 2022-06-16 DIAGNOSIS — N368 Other specified disorders of urethra: Secondary | ICD-10-CM | POA: Diagnosis not present

## 2022-06-16 DIAGNOSIS — Z7982 Long term (current) use of aspirin: Secondary | ICD-10-CM | POA: Diagnosis not present

## 2022-06-16 DIAGNOSIS — Z933 Colostomy status: Secondary | ICD-10-CM | POA: Diagnosis not present

## 2022-06-16 DIAGNOSIS — L89154 Pressure ulcer of sacral region, stage 4: Secondary | ICD-10-CM | POA: Diagnosis not present

## 2022-06-16 DIAGNOSIS — R338 Other retention of urine: Secondary | ICD-10-CM | POA: Diagnosis not present

## 2022-06-16 DIAGNOSIS — N35911 Unspecified urethral stricture, male, meatal: Secondary | ICD-10-CM | POA: Diagnosis not present

## 2022-06-16 DIAGNOSIS — T83090A Other mechanical complication of cystostomy catheter, initial encounter: Secondary | ICD-10-CM | POA: Diagnosis not present

## 2022-06-16 DIAGNOSIS — I10 Essential (primary) hypertension: Secondary | ICD-10-CM | POA: Diagnosis not present

## 2022-06-16 DIAGNOSIS — N319 Neuromuscular dysfunction of bladder, unspecified: Secondary | ICD-10-CM | POA: Diagnosis not present

## 2022-06-16 DIAGNOSIS — N360 Urethral fistula: Secondary | ICD-10-CM | POA: Diagnosis not present

## 2022-06-16 DIAGNOSIS — L89324 Pressure ulcer of left buttock, stage 4: Secondary | ICD-10-CM | POA: Diagnosis not present

## 2022-06-16 DIAGNOSIS — R310 Gross hematuria: Secondary | ICD-10-CM | POA: Diagnosis not present

## 2022-06-16 DIAGNOSIS — E785 Hyperlipidemia, unspecified: Secondary | ICD-10-CM | POA: Diagnosis not present

## 2022-06-16 DIAGNOSIS — R319 Hematuria, unspecified: Secondary | ICD-10-CM | POA: Diagnosis not present

## 2022-06-16 DIAGNOSIS — L89314 Pressure ulcer of right buttock, stage 4: Secondary | ICD-10-CM | POA: Diagnosis not present

## 2022-06-16 DIAGNOSIS — K592 Neurogenic bowel, not elsewhere classified: Secondary | ICD-10-CM | POA: Diagnosis not present

## 2022-06-16 DIAGNOSIS — R339 Retention of urine, unspecified: Secondary | ICD-10-CM | POA: Diagnosis not present

## 2022-06-16 DIAGNOSIS — N3289 Other specified disorders of bladder: Secondary | ICD-10-CM | POA: Diagnosis not present

## 2022-06-16 DIAGNOSIS — G8220 Paraplegia, unspecified: Secondary | ICD-10-CM | POA: Diagnosis not present

## 2022-06-22 ENCOUNTER — Telehealth: Payer: Self-pay | Admitting: *Deleted

## 2022-06-22 DIAGNOSIS — R310 Gross hematuria: Secondary | ICD-10-CM | POA: Diagnosis not present

## 2022-06-22 DIAGNOSIS — R338 Other retention of urine: Secondary | ICD-10-CM | POA: Diagnosis not present

## 2022-06-22 NOTE — Patient Instructions (Signed)
Visit Information  Thank you for taking time to visit with me today. Please don't hesitate to contact me if I can be of assistance to you.   Following are the goals we discussed today:   Goals Addressed               This Visit's Progress     Patient Stated     Patient will obtain a stretcher transported visit to wound care center + other MD visit transportation [facility]) (pt-stated)   Not on track     Care Coordination Interventions: Discussed plans with patient for ongoing care management follow up and provided patient with direct contact information for care management team Left a message with the name of patient's DSS case worker for [place] [phone] After she left a message request DSS case worker name Rosanna [name] [phone] and stating [facility] not wanting to transport patient home.  Outreach to [employer] [phone] with long wait times spoke with Gaspar Skeeters to confirm the patient after getting his discharge papers are to call for one way trip back home.  Gaspar Skeeters was able to find that there was a call prior to the RN CM's that assisted with scheduling the patient to be picked up from [facility] on 06/23/22 at 11 AM         Our next appointment is by telephone on 06/23/22 at 1:30 pm  Please call the care guide team at [phone] if you need to cancel or reschedule your appointment.   If you are experiencing a Mental Health or [place] or need someone to talk to, please call the Suicide and Crisis Lifeline: 988 call the [place] National Suicide Prevention Lifeline: 732-451-2737 or TTY: 219-280-1306 TTY 929-160-5444) to talk to a trained counselor call 1-800-273-TALK (toll free, 24 hour hotline) go to [facility] [address], [place] [phone]) call 911   Patient verbalizes understanding of instructions and care plan provided today and agrees to view in [place]. Active MyChart status and  patient understanding of how to access instructions and care plan via MyChart confirmed with patient.     The patient has been provided with contact information for the care management team and has been advised to call with any health related questions or concerns.   [place] Lavina Hamman, RN, BSN, [place] Coordinator Office number [phone]

## 2022-06-22 NOTE — Patient Outreach (Signed)
  Care Coordination   Care coordination-transportation  Visit Note   06/22/2022 Name: [name] MRN: [id number] DOB: [birth year]  [name] is a 56 y.o. year old male who sees [name], NP for primary care. I  received a voice message from [place] at [facility] [phone]. Returned her call and left a voice message providing the DSS case worker Rosanna [name] [phone]  What matters to the patients health and wellness today?  Transportation ([employer] [place]) concern while at [place]               This Visit's Progress     Patient Stated     Patient will obtain a stretcher transported visit to wound care center + other MD visit transportation [facility]) (pt-stated)   Not on track     Care Coordination Interventions: Discussed plans with patient for ongoing care management follow up and provided patient with direct contact information for care management team Left a message with the name of patient's DSS case worker for Tiffany [phone] After she left a message request DSS case worker name Rosanna [name] [phone] and stating [facility] not wanting to transport patient home.  Outreach to [employer] [phone] with long wait times spoke with Gaspar Skeeters to confirm the patient after getting his discharge papers are to call for one way trip back home.  Gaspar Skeeters was able to find that there was a call prior to the RN CM's that assisted with scheduling the patient to be picked up from [facility] on 06/23/22 at 11 AM         SDOH assessments and interventions completed:  Yes  SDOH Interventions Today    Flowsheet Row Most Recent Value  SDOH Interventions   Food Insecurity Interventions Intervention Not Indicated  Transportation Interventions Other (Comment)  [medicaid [facility] transportation]  Utilities Interventions Intervention Not Indicated        Care Coordination Interventions Activated:  Yes  Care Coordination  Interventions:  Yes, provided   Follow up plan: Follow up call scheduled for 06/23/22    Encounter Outcome:  Pt. Visit Completed   Tokiko Diefenderfer [name], RN, BSN, [place] Coordinator Office number [phone]

## 2022-06-23 ENCOUNTER — Ambulatory Visit: Payer: Self-pay | Admitting: *Deleted

## 2022-06-23 NOTE — Patient Outreach (Signed)
  Care Coordination   06/23/2022 Name: [name] MRN: [id number] DOB: 1966-07-27   Care Coordination Outreach Attempts:  An unsuccessful telephone outreach was attempted today to offer the patient information about available care coordination services as a benefit of their health plan.   Follow Up Plan:  Additional outreach attempts will be made to offer the patient care coordination information and services.   Encounter Outcome:  No Answer  Care Coordination Interventions Activated:  No   Care Coordination Interventions:  No, not indicated    SIG Matalyn Nawaz [name], RN, BSN, [place] Coordinator Office number [phone]

## 2022-06-26 ENCOUNTER — Telehealth (INDEPENDENT_AMBULATORY_CARE_PROVIDER_SITE_OTHER): Payer: Self-pay | Admitting: Primary Care

## 2022-06-26 DIAGNOSIS — N3289 Other specified disorders of bladder: Secondary | ICD-10-CM | POA: Diagnosis not present

## 2022-06-26 NOTE — Telephone Encounter (Signed)
Copied from [place] [phone]. Topic: General - Other >> Jun 23, 2022 12:42 PM Oley Balm E wrote: Reason for CRM: Emi Holes calling from [place] called to report that a fax is coming regarding CPAP supplies. best contact: [phone]

## 2022-06-26 NOTE — Telephone Encounter (Signed)
Returned call to [employer] and had to lvm and asked for a call back. Looked in paperwork and don't have anything in regards to pt

## 2022-06-27 ENCOUNTER — Ambulatory Visit: Payer: Self-pay | Admitting: *Deleted

## 2022-06-27 DIAGNOSIS — T83090A Other mechanical complication of cystostomy catheter, initial encounter: Secondary | ICD-10-CM | POA: Diagnosis not present

## 2022-06-27 NOTE — Patient Outreach (Signed)
  Care Coordination   06/27/2022 Name: [name] MRN: [id number] DOB: [birth year]   Care Coordination Outreach Attempts:  A second unsuccessful outreach was attempted today to offer the patient with information about available care coordination services as a benefit of their health plan.     Follow Up Plan:  Additional outreach attempts will be made to offer the patient care coordination information and services.   Encounter Outcome:  No Answer  Care Coordination Interventions Activated:  No   Care Coordination Interventions:  No, not indicated    SIG Karman Veney [name], RN, BSN, [place] Coordinator Office number [phone]

## 2022-06-27 NOTE — Patient Instructions (Signed)
Visit Information  Thank you for taking time to visit with me today. Please don't hesitate to contact me if I can be of assistance to you.   Following are the goals we discussed today:   Goals Addressed               This Visit's Progress     Patient Stated     Patient will obtain a stretcher transported visit to wound care center + other MD visit transportation [facility]) (pt-stated)   On track     Care Coordination Interventions: Discussed plans with patient for ongoing care management follow up and provided patient with direct contact information for care management team Unsuccessful outreach with a message left, Review of EPIC care everywhere. Received an incoming outreach from Patient to confirm he remains hospitalized at [facility].  He reports needing emergency surgery prior to 06/23/22 scheduled discharge for bleeding  No appointment nor transportation scheduling needed today  Encouragement provided.         Our next appointment is by telephone on 07/06/22 at 3 pm  Please call the care guide team at [phone] if you need to cancel or reschedule your appointment.   If you are experiencing a Mental Health or [place] or need someone to talk to, please call the Suicide and Crisis Lifeline: 988 call the [place] National Suicide Prevention Lifeline: (306) 097-1870 or TTY: 270-667-6291 TTY (647) 083-8269) to talk to a trained counselor call 1-800-273-TALK (toll free, 24 hour hotline) go to [facility] [address] [place] [phone]) call 911   Patient verbalizes understanding of instructions and care plan provided today and agrees to view in [place]. Active MyChart status and patient understanding of how to access instructions and care plan via MyChart confirmed with patient.     The patient has been provided with contact information for the care management team and has been advised to call with any health related questions or  concerns.   [place] Lavina Hamman, RN, BSN, [place] Coordinator Office number [phone]

## 2022-06-27 NOTE — Patient Outreach (Addendum)
  Care Coordination   Incoming outreach from patient  Visit Note   06/27/2022 Name: [name] MRN: [id number] DOB: [birth year]  [name] is a 56 y.o. year old male who sees [name] Perna, NP for primary care. I spoke with  [name], academic by phone today.  What matters to the patients health and wellness today?  Remains at [facility] as he began bleeding prior to his 06/23/22 discharge. Dx hypospadias. 06/16/22 s/p anterior urethroplasty/insert supra pubic catheter (Dr [name])   He confirms having to have emergency surgery/cystourethroscopy with irrigation & evacuation of multiple obstructing clots on 06/22/22 (Dr [name]).  He agrees to outreach to [employer] CM as needed    Goals Addressed               This Visit's Progress     Patient Stated     Patient will obtain a stretcher transported visit to wound care center + other MD visit transportation [facility]) (pt-stated)   On track     Care Coordination Interventions: Discussed plans with patient for ongoing care management follow up and provided patient with direct contact information for care management team Unsuccessful outreach with a message left, Review of EPIC care everywhere. Received an incoming outreach from Patient to confirm he remains hospitalized at [facility].  He reports needing emergency surgery prior to 06/23/22 scheduled discharge for bleeding  No appointment nor transportation scheduling needed today  Encouragement provided.         SDOH assessments and interventions completed:  No     Care Coordination Interventions Activated:  Yes  Care Coordination Interventions:  Yes, provided   Follow up plan: Follow up call scheduled for 07/06/22 3:00 pm    Encounter Outcome:  Pt. Visit Completed   [name] Bucy [name], RN, BSN, [place] Coordinator Office number [phone]

## 2022-07-05 ENCOUNTER — Telehealth: Payer: Self-pay

## 2022-07-05 NOTE — Patient Outreach (Signed)
  Care Coordination TOC Note Transition Care Management Unsuccessful Follow-up Telephone Call  Date of discharge and from where:  [facility] 06/16/22-07/04/22  Attempts:  1st Attempt  Reason for unsuccessful TCM follow-up call:  No answer/busy

## 2022-07-06 ENCOUNTER — Ambulatory Visit: Payer: Self-pay | Admitting: *Deleted

## 2022-07-06 NOTE — Patient Instructions (Signed)
Visit Information  Thank you for taking time to visit with me today. Please don't hesitate to contact me if I can be of assistance to you.   Following are the goals we discussed today:   Goals Addressed               This Visit's Progress     Patient Stated     Patient will obtain a stretcher transported visit to wound care center + other MD visit transportation [facility]) (pt-stated)   On track     Care Coordination Interventions: Discussed plans with patient for ongoing care management follow up and provided patient with direct contact information for care management team Encouragement provided. Spoke with Liliane Channel at [facility] [phone] to schedule a stretcher [facility] for 07/18/22 0830  Outreach to Dr Iona Hansen office to confirm location of follow up visit spoke with [place] Outreach to [place] Medicaid transportation to schedule visit to return to see Dr Iona Hansen on 07/17/22 1130 [address] [place] who confirmed the booking of trip with [facility] with approval for travel to  next appointment is by telephone on 08/10/22 at 3 pm  Please call the care guide team at [phone] if you need to cancel or reschedule your appointment.   If you are experiencing a Mental Health or [place] or need someone to talk to, please call the Suicide and Crisis Lifeline: 988 call the [place] National Suicide Prevention Lifeline: 534-816-0494 or TTY: (925) 500-2055 TTY 4752990997) to talk to a trained counselor call 1-800-273-TALK (toll free, 24 hour hotline) go to [name] [address], [place] [phone]) call 911   Patient verbalizes understanding of instructions and care plan provided today and agrees to view in [place]. Active MyChart status and patient understanding of how to access instructions and care plan via MyChart confirmed with patient.     The patient has  been provided with contact information for the care management team and has been advised to call with any health related questions or concerns.   [place] Lavina Hamman, RN, BSN, [place] Coordinator Office number [phone]

## 2022-07-06 NOTE — Patient Outreach (Signed)
  Care Coordination   Follow Up Visit Note   07/06/2022 Name: [name] MRN: [id number] DOB: 01/20/1966  [name] is a 56 y.o. year old male who sees [name] Perna, NP for primary care. I spoke with  [name], academic by phone today.  What matters to the patients health and wellness today?  Upcoming appointments   Patient updated RN CM of his [facility] stay with complications- He had an extensive amount of blood clots removed. Prior to a scheduled discharge, he started to have pressure to urinate, started bleeding and further clots were found.  [name]   Goals Addressed               This Visit's Progress     Patient Stated     Patient will obtain a stretcher transported visit to wound care center + other MD visit transportation [facility]) (pt-stated)   On track     Care Coordination Interventions: Discussed plans with patient for ongoing care management follow up and provided patient with direct contact information for care management team Encouragement provided. Spoke with [name] at [facility] [phone] to schedule a stretcher ride to [facility] for 07/18/22 0830  Outreach to [name] office to confirm location of follow up visit spoke with [name] Outreach to [place] Medicaid transportation to schedule visit to return to see [name] on 07/17/22 1130 [address] [place] who confirmed the booking of trip with [facility] with approval for travel to [place] assessments and interventions completed:  Yes   Care Coordination Interventions Activated:  Yes  Care Coordination Interventions:  Yes, provided   Follow up plan: Follow up call scheduled for 08/10/22 3 pm    Encounter Outcome:  Pt. Visit Completed   [name] Garron [name], RN, BSN, [place] Coordinator Office number [phone]

## 2022-07-07 ENCOUNTER — Other Ambulatory Visit (INDEPENDENT_AMBULATORY_CARE_PROVIDER_SITE_OTHER): Payer: Self-pay | Admitting: Primary Care

## 2022-07-07 DIAGNOSIS — G4733 Obstructive sleep apnea (adult) (pediatric): Secondary | ICD-10-CM

## 2022-07-07 NOTE — Patient Outreach (Signed)
  Care Coordination [facility] Note Transition Care Management Follow-up Telephone Call Date of discharge and from where: 07/04/22 from [place] How have you been since you were released from the hospital? Better, good Any questions or concerns? Yes  Items Reviewed: Did the pt receive and understand the discharge instructions provided? Yes  Medications obtained and verified? Yes  Other?  Need assist with follow up appointments to urology and missed wound care clinic  Any new allergies since your discharge? No  Dietary orders reviewed? Yes Do you have support at home? Yes   Home Care and Equipment/Supplies: Were home health services ordered? no If so, what is the name of the agency? N/a  Has the agency set up a time to come to the patient's home? not applicable Were any new equipment or medical supplies ordered?  No What is the name of the medical supply agency? N/a Were you able to get the supplies/equipment? not applicable Do you have any questions related to the use of the equipment or supplies? No  Functional Questionnaire: (I = Independent and D = Dependent) ADLs: D  Bathing/Dressing- D  Meal Prep- D  Eating- I  Maintaining continence- D  Transferring/Ambulation- D  Managing Meds- D  Follow up appointments reviewed:  PCP Hospital f/u appt confirmed?  Pt reports no preference pcp seen on 05/29/22 prior to [facility] admission  Scheduled to see wound care clinic  on 07/18/22 @ 0830. [facility] f/u appt confirmed? Yes  Scheduled to see Dr Francesca Jewett on 07/17/22 @ 1130. Are transportation arrangements needed? Yes  If their condition worsens, is the pt aware to call PCP or go to the Emergency Dept.? Yes Was the patient provided with contact information for the PCP's office or ED? Yes Was to pt encouraged to call back with questions or concerns? Yes  SDOH assessments and interventions completed:   No  Care Coordination Interventions Activated:  Yes   Care Coordination  Interventions:  Transportation arranged   Encounter Outcome:  Pt. Visit Completed    Emie Sommerfeld [name], RN, BSN, [place] Coordinator Office number [phone]

## 2022-07-13 ENCOUNTER — Other Ambulatory Visit: Payer: Self-pay | Admitting: Student

## 2022-07-13 DIAGNOSIS — N319 Neuromuscular dysfunction of bladder, unspecified: Secondary | ICD-10-CM | POA: Diagnosis not present

## 2022-07-13 DIAGNOSIS — G4733 Obstructive sleep apnea (adult) (pediatric): Secondary | ICD-10-CM | POA: Diagnosis not present

## 2022-07-13 DIAGNOSIS — L89314 Pressure ulcer of right buttock, stage 4: Secondary | ICD-10-CM | POA: Diagnosis not present

## 2022-07-13 DIAGNOSIS — M109 Gout, unspecified: Secondary | ICD-10-CM

## 2022-07-13 DIAGNOSIS — Z933 Colostomy status: Secondary | ICD-10-CM | POA: Diagnosis not present

## 2022-07-13 DIAGNOSIS — L89154 Pressure ulcer of sacral region, stage 4: Secondary | ICD-10-CM | POA: Diagnosis not present

## 2022-07-17 ENCOUNTER — Telehealth: Payer: Self-pay | Admitting: *Deleted

## 2022-07-17 ENCOUNTER — Encounter: Payer: Self-pay | Admitting: *Deleted

## 2022-07-17 DIAGNOSIS — G4733 Obstructive sleep apnea (adult) (pediatric): Secondary | ICD-10-CM | POA: Diagnosis not present

## 2022-07-17 NOTE — Patient Instructions (Addendum)
Visit Information  Thank you for taking time to visit with me today. Please don't hesitate to contact me if I can be of assistance to you.   Following are the goals we discussed today:   Goals Addressed               This Visit's Progress     Patient Stated     Patient will obtain a stretcher transported visit to wound care center + other MD visit via medicaid transportation [facility]) (pt-stated)   Not on track     Care Coordination Interventions: Discussed plans with patient for ongoing care management follow up and provided patient with direct contact information for care management team Encouragement provided. Spoke with Liliane Channel at [facility] [phone] to Confirm a scheduled [facility] for 07/18/22 0830. Reminded [name] of this appointment and transportation vendor  [name] called to updated RN CM he missed his 07/17/22 1130 Dr Iona Hansen office visit. Needs transportation rescheduling  Outreach to DSS Medicaid transportation to schedule visit to return to see Dr Iona Hansen on 07/24/22 [address] [place]  Extended wait times of 15 minutes, then 30 minutes, 23 minutes, 34 minutes Spoke Butch Penny who confirmed the booking of trip with [employer] with approval for travel to [place] on 07/24/22         Our next appointment is by telephone on 08/10/22 at 3 pm  Please call the care guide team at [phone] if you need to cancel or reschedule your appointment.   If you are experiencing a Mental Health or [place] or need someone to talk to, please call the Suicide and Crisis Lifeline: 988 call the [place] National Suicide Prevention Lifeline: 671-273-4482 or TTY: 639 164 5192 TTY 610-849-2215) to talk to a trained counselor call 1-800-273-TALK (toll free, 24 hour hotline) go to [facility] [address], [place] [phone]) call 911   Patient verbalizes understanding of  instructions and care plan provided today and agrees to view in [place]. Active MyChart status and patient understanding of how to access instructions and care plan via MyChart confirmed with patient.     The patient has been provided with contact information for the care management team and has been advised to call with any health related questions or concerns.    Ransome Helwig [name], RN, BSN, [place] Coordinator Office number [phone]

## 2022-07-17 NOTE — Patient Outreach (Signed)
  Care Coordination   Follow Up Visit Note   07/17/2022 Name: Tel Hevia MRN: [id number] DOB: July 25, 1966  Biran Bridgers is a 56 y.o. year old male who sees [name], NP for primary care. I spoke with  Itzael Librarian, academic by phone today.  What matters to the patients health and wellness today?  Missed Dr Loura Back 07/17/22 appointment related to patient not ready when medicaid transportation arrived He called Dr Francesca Jewett office to speak with staff and rescheduled to 07/24/22 1030 am  Reminded of his 07/18/22 [facility] appointment to wound care He reports he is overall doing well. He reports he is not having any urinary symptoms and his wound is healing well.       Goals Addressed               This Visit's Progress     Patient Stated     Patient will obtain a stretcher transported visit to wound care center + other MD visit via medicaid transportation [facility]) (pt-stated)   Not on track     Care Coordination Interventions: Discussed plans with patient for ongoing care management follow up and provided patient with direct contact information for care management team Encouragement provided. Spoke with Liliane Channel at [facility] [phone] to Confirm a scheduled [facility] for 07/18/22 0830. Reminded Mr Venhuizen of this appointment and transportation vendor  Mr Bushong called to updated RN CM he missed his 07/17/22 1130 Dr Iona Hansen office visit. Needs transportation rescheduling  Outreach to DSS Medicaid transportation to schedule visit to return to see Dr Iona Hansen on 07/24/22 [address] [place] [address] [place]   Extended wait times of 15 minutes, then 30 minutes, 23 minutes, 34 minutes Arther Dames who confirmed the booking of trip with [employer] with approval for travel to [place] on 07/24/22         SDOH assessments and interventions completed:  No      Care Coordination Interventions Activated:  Yes  Care Coordination Interventions:  Yes,  provided   Follow up plan: Follow up call scheduled for 08/10/22    Encounter Outcome:  Pt. Visit Completed   Deania Siguenza [name], RN, BSN, [place] Coordinator Office number [phone]

## 2022-07-18 DIAGNOSIS — L89314 Pressure ulcer of right buttock, stage 4: Secondary | ICD-10-CM | POA: Diagnosis not present

## 2022-07-18 DIAGNOSIS — L89154 Pressure ulcer of sacral region, stage 4: Secondary | ICD-10-CM | POA: Diagnosis not present

## 2022-07-24 DIAGNOSIS — Z960 Presence of urogenital implants: Secondary | ICD-10-CM | POA: Diagnosis not present

## 2022-07-24 DIAGNOSIS — N360 Urethral fistula: Secondary | ICD-10-CM | POA: Diagnosis not present

## 2022-07-24 DIAGNOSIS — N319 Neuromuscular dysfunction of bladder, unspecified: Secondary | ICD-10-CM | POA: Diagnosis not present

## 2022-07-24 DIAGNOSIS — S21239S Puncture wound without foreign body of unspecified back wall of thorax without penetration into thoracic cavity, sequela: Secondary | ICD-10-CM | POA: Diagnosis not present

## 2022-07-24 DIAGNOSIS — Z466 Encounter for fitting and adjustment of urinary device: Secondary | ICD-10-CM | POA: Diagnosis not present

## 2022-07-24 DIAGNOSIS — Z933 Colostomy status: Secondary | ICD-10-CM | POA: Diagnosis not present

## 2022-07-24 DIAGNOSIS — G8220 Paraplegia, unspecified: Secondary | ICD-10-CM | POA: Diagnosis not present

## 2022-07-27 ENCOUNTER — Telehealth (INDEPENDENT_AMBULATORY_CARE_PROVIDER_SITE_OTHER): Payer: Self-pay | Admitting: Primary Care

## 2022-07-27 NOTE — Telephone Encounter (Signed)
Copied from [place] [id number]. Topic: General - Other >> Jul 27, 2022 11:15 AM Everette C wrote: Reason for CRM: Kieth Brightly with [employer] has called regarding paperwork that will be submitted to the practice related to the patient's request for a power wheelchair repair, Charity fundraiser and cushion   Order # [id number]  Please contact Kieth Brightly further if needed

## 2022-07-28 NOTE — Telephone Encounter (Signed)
Paperwork has been received and will be faxed back today

## 2022-08-04 ENCOUNTER — Telehealth: Payer: Self-pay | Admitting: *Deleted

## 2022-08-04 NOTE — Patient Outreach (Signed)
  Care Coordination   Follow Up Visit Note   08/04/2022 Name: [name] MRN: [id number] DOB: May 10, 1966  [name] is a 56 y.o. year old male who sees [name], NP for primary care. I spoke with  [name], academic by phone today.  What matters to the patients health and wellness today?  Medical rides to upcoming appointments  Confirmed [name] did not have any appointments prior to 08/15/22    Goals Addressed               This Visit's Progress     Patient Stated     Patient will obtain a stretcher transported visit to wound care center + other MD visit via medicaid transportation [facility]) (pt-stated)   On track     Care Coordination Interventions: Discussed plans with patient for ongoing care management follow up and provided patient with direct contact information for care management team Encouragement provided. Confirmed [name] did not have any appointments prior to 08/15/22   And confirmed the upcoming RN CM appointment with him to start scheduling his appointments within the [place] time frames        SDOH assessments and interventions completed:  No     Care Coordination Interventions Activated:  Yes  Care Coordination Interventions:  Yes, provided   Follow up plan: Follow up call scheduled for 08/10/22    Encounter Outcome:  Pt. Visit Completed   [name] [name], RN, BSN, [place] Coordinator Office number [phone]

## 2022-08-04 NOTE — Patient Instructions (Signed)
Visit Information  Thank you for taking time to visit with me today. Please don't hesitate to contact me if I can be of assistance to you.   Following are the goals we discussed today:   Goals Addressed               This Visit's Progress     Patient Stated     Patient will obtain a stretcher transported visit to wound care center + other MD visit via medicaid transportation [facility]) (pt-stated)   On track     Care Coordination Interventions: Discussed plans with patient for ongoing care management follow up and provided patient with direct contact information for care management team Encouragement provided. Confirmed Mr Broaddus did not have any appointments prior to 08/15/22   And confirmed the upcoming RN CM appointment with him to start scheduling his appointments within the [place] time frames        Our next appointment is by telephone on 08/10/22 at 3 pm  Please call the care guide team at [phone] if you need to cancel or reschedule your appointment.   If you are experiencing a Mental Health or [place] or need someone to talk to, please call the Suicide and Crisis Lifeline: 988 call the [place] National Suicide Prevention Lifeline: 830-627-0862 or TTY: 7174285641 TTY 502 333 0602) to talk to a trained counselor call 1-800-273-TALK (toll free, 24 hour hotline) go to [facility] [address] [place] [phone]) call 911   Patient verbalizes understanding of instructions and care plan provided today and agrees to view in [place]. Active MyChart status and patient understanding of how to access instructions and care plan via MyChart confirmed with patient.     The patient has been provided with contact information for the care management team and has been advised to call with any health related questions or concerns.   Henya Aguallo [name], RN, BSN, [place] Coordinator Office number [phone]

## 2022-08-10 ENCOUNTER — Ambulatory Visit: Payer: Self-pay | Admitting: *Deleted

## 2022-08-10 NOTE — Patient Outreach (Addendum)
  Care Coordination   Follow Up Visit Note   08/10/2022 Name: [name] MRN: [id number] DOB: 1966/03/10  [name] is a 56 y.o. year old male who sees [name] Perna, NP for primary care. I spoke with  [name], academic by phone today.  What matters to the patients health and wellness today?  He reports he had issues with constipation "stool hardening up" He reports his stool was "rock hard and was hurting my stomach and stoma" He is aware that his methadone and immobile status are causes He is taking ordered miralax, colace & senna   He reports he had a lot of fluids. Eats mainly fish and chicken only  He plans to have his aide or brother purchase him more fruits and teas      Goals Addressed               This Visit's Progress     Patient Stated     manage constipation ([facility]) (pt-stated)   Not on track     Care Coordination Interventions: Evaluation of current treatment plan related to chronic constipation and patient's adherence to plan as established by provider Advised patient to drink warm to hot fluids daily to help soften stools. Discussed Smooth move teas, water  Discussed he is prone to constipation as he is immobile and take methadone.  Assessed his diet.and medications to assist with constipation Encouraged intake of adequate fluids, fiber       Patient will obtain a stretcher transported visit to wound care center + other MD visit via medicaid transportation [facility]) (pt-stated)   On track     Care Coordination Interventions: Discussed plans with patient for ongoing care management follow up and provided patient with direct contact information for care management team Encouragement provided. Confirmed [name]  08/15/22 0900 [place] [facility] appointment transportation with [facility] [phone] (male) Outreach to [name] at [facility] health sleep study center to inquire if patient needs to be sent on 08/28/22 via stretcher or would he be okay arriving  with his aide for assistance. [name] confirmed with RN CM that with a weight of 300 lbs, [name] will need to definitely have someone to assist him during the sleep study stay as there are not aides to [place] will have [name] return a call to RN CM if patient needs to arrive via stretcher.        SDOH assessments and interventions completed:  No     Care Coordination Interventions Activated:  Yes  Care Coordination Interventions:  Yes, provided   Follow up plan: Follow up call scheduled for 08/22/22     Encounter Outcome:  Pt. Visit Completed   [name] [name], RN, BSN, [place] Coordinator Office number [phone]

## 2022-08-10 NOTE — Patient Instructions (Signed)
Visit Information  Thank you for taking time to visit with me today. Please don't hesitate to contact me if I can be of assistance to you.   Following are the goals we discussed today:   Goals Addressed               This Visit's Progress     Patient Stated     manage constipation ([facility]) (pt-stated)   Not on track     Care Coordination Interventions: Evaluation of current treatment plan related to chronic constipation and patient's adherence to plan as established by provider Advised patient to drink warm to hot fluids daily to help soften stools. Discussed Smooth move teas, water  Discussed he is prone to constipation as he is immobile and take methadone.  Assessed his diet.and medications to assist with constipation Encouraged intake of adequate fluids, fiber       Patient will obtain a stretcher transported visit to wound care center + other MD visit via medicaid transportation [facility]) (pt-stated)   On track     Care Coordination Interventions: Discussed plans with patient for ongoing care management follow up and provided patient with direct contact information for care management team Encouragement provided. Confirmed Mr Savitz  08/15/22 0900 [facility] appointment transportation with [facility] [phone] (male) Outreach to [place] at [facility] sleep study center to inquire if patient needs to be sent on 08/28/22 via stretcher or would he be okay arriving with his aide for assistance. Coralyn Mark confirmed with RN CM that with a weight of 300 lbs, Mr Tomaro will need to definitely have someone to assist him during the sleep study stay as there are not aides to [place] will have [name] return a call to RN CM if patient needs to arrive via stretcher.        Our next appointment is by telephone on 08/22/22 at 2 pm  Please call the care guide team at [phone] if you need to cancel or reschedule your appointment.   If you are experiencing a Mental Health or  [place] or need someone to talk to, please call the Suicide and Crisis Lifeline: 988 call the [place] National Suicide Prevention Lifeline: 270-383-2278 or TTY: 787 246 8098 TTY (949)042-2267) to talk to a trained counselor call 1-800-273-TALK (toll free, 24 hour hotline) go to [facility] [address] [place] [phone]) call 911   Patient verbalizes understanding of instructions and care plan provided today and agrees to view in [place]. Active MyChart status and patient understanding of how to access instructions and care plan via MyChart confirmed with patient.     The patient has been provided with contact information for the care management team and has been advised to call with any health related questions or concerns.   Lelia Jons [name], RN, BSN, [place] Coordinator Office number [phone]

## 2022-08-13 DIAGNOSIS — Z933 Colostomy status: Secondary | ICD-10-CM | POA: Diagnosis not present

## 2022-08-13 DIAGNOSIS — L89154 Pressure ulcer of sacral region, stage 4: Secondary | ICD-10-CM | POA: Diagnosis not present

## 2022-08-13 DIAGNOSIS — G4733 Obstructive sleep apnea (adult) (pediatric): Secondary | ICD-10-CM | POA: Diagnosis not present

## 2022-08-13 DIAGNOSIS — N319 Neuromuscular dysfunction of bladder, unspecified: Secondary | ICD-10-CM | POA: Diagnosis not present

## 2022-08-13 DIAGNOSIS — L89314 Pressure ulcer of right buttock, stage 4: Secondary | ICD-10-CM | POA: Diagnosis not present

## 2022-08-14 DIAGNOSIS — L8994 Pressure ulcer of unspecified site, stage 4: Secondary | ICD-10-CM | POA: Diagnosis not present

## 2022-08-14 DIAGNOSIS — Z933 Colostomy status: Secondary | ICD-10-CM | POA: Diagnosis not present

## 2022-08-14 DIAGNOSIS — G9589 Other specified diseases of spinal cord: Secondary | ICD-10-CM | POA: Diagnosis not present

## 2022-08-14 DIAGNOSIS — G8220 Paraplegia, unspecified: Secondary | ICD-10-CM | POA: Diagnosis not present

## 2022-08-15 DIAGNOSIS — L89154 Pressure ulcer of sacral region, stage 4: Secondary | ICD-10-CM | POA: Diagnosis not present

## 2022-08-15 DIAGNOSIS — L89894 Pressure ulcer of other site, stage 4: Secondary | ICD-10-CM | POA: Diagnosis not present

## 2022-08-16 ENCOUNTER — Encounter (HOSPITAL_COMMUNITY): Payer: Self-pay | Admitting: Nurse Practitioner

## 2022-08-17 ENCOUNTER — Other Ambulatory Visit (INDEPENDENT_AMBULATORY_CARE_PROVIDER_SITE_OTHER): Payer: Self-pay | Admitting: Primary Care

## 2022-08-17 ENCOUNTER — Telehealth: Payer: Self-pay | Admitting: *Deleted

## 2022-08-17 DIAGNOSIS — G4733 Obstructive sleep apnea (adult) (pediatric): Secondary | ICD-10-CM

## 2022-08-17 NOTE — Patient Outreach (Signed)
  Care Coordination   Collaboration with sleep center  Visit Note   08/17/2022 Name: [name] MRN: [id number] DOB: 1966/03/13  [name] is a 56 y.o. year old male who sees [name], NP for primary care. I  received a call from sleep study staff requesting to have patient complete as home sleep study test vs going to the sleep center on 08/30/22.    What matters to the patients health and wellness today?  Received a call from sleep study staff requesting to have patient complete as home sleep study test vs going to the sleep center on 08/30/22. They have not been able to obtain an aide to assist patient on 08/30/22     Goals Addressed               This Visit's Progress     Patient Stated     Obtain sleep study test [facility]) (pt-stated)   Not on track     Received a call from sleep study staff requesting to have patient complete as home sleep study test vs going to the sleep center on 08/30/22. They have not been able to obtain an aide to assist patient on 08/30/22  RN CM will request an order from his provider for a Home sleep test        SDOH assessments and interventions completed:  No     Care Coordination Interventions Activated:  Yes  Care Coordination Interventions:  Yes, provided   Follow up plan: Follow up call scheduled for 08/22/22    Encounter Outcome:  Pt. Visit Completed   Lonette Stevison [name], RN, BSN, [place] Coordinator Office number [phone]

## 2022-08-22 ENCOUNTER — Ambulatory Visit: Payer: Self-pay | Admitting: *Deleted

## 2022-08-28 ENCOUNTER — Encounter (HOSPITAL_BASED_OUTPATIENT_CLINIC_OR_DEPARTMENT_OTHER): Payer: Medicare Other | Admitting: Internal Medicine

## 2022-08-29 ENCOUNTER — Telehealth: Payer: Self-pay | Admitting: *Deleted

## 2022-08-29 NOTE — Patient Outreach (Signed)
  Care Coordination   Follow Up Visit Note   08/29/2022 Name: [name] MRN: [id number] DOB: 09/11/1966  [name] is a 56 y.o. year old male who sees [name], NP for primary care. I spoke with  [place] Librarian, academic by phone today.  What matters to the patients health and wellness today?  Return call to patient with noted difficulty getting into his my chart   Allergy eats more plums - made both legs very dry scaly, itchy red & rash -resolved with decrease of plums and calamine lotion Do one different fruit a day vs 3-4 plums a day   Goals Addressed               This Visit's Progress     Patient Stated     manage constipation ([facility]) (pt-stated)   On track     Care Coordination Interventions: Evaluation of current treatment plan related to chronic constipation and patient's adherence to plan as established by provider Advised patient to continue to take in daily variety of fruits to help soften stools. Discussed intake of different fruit once a day vs 3-4 plums a day to prevent hypersensitivity reaction  Added plum hypersensitivity to allergy list Assisted to reset password successfully to get him back in his my chart. Discussed the access to merge his [facility], [facility] and [name] charts        SDOH assessments and interventions completed:  Yes  SDOH Interventions Today    Flowsheet Row Most Recent Value  SDOH Interventions   Food Insecurity Interventions Intervention Not Indicated  Housing Interventions Intervention Not Indicated  Utilities Interventions Intervention Not Indicated  Financial Strain Interventions Intervention Not Indicated  Stress Interventions Intervention Not Indicated  Social Connections Interventions Intervention Not Indicated        Care Coordination Interventions:  Yes, provided   Follow up plan: Follow up call scheduled for 09/04/22    Encounter Outcome:  Pt. Visit Completed   Lujean Ebright [name], RN, BSN, [place]  Coordinator Office number [phone]

## 2022-08-29 NOTE — Patient Instructions (Addendum)
Visit Information  Thank you for taking time to visit with me today. Please don't hesitate to contact me if I can be of assistance to you.   Following are the goals we discussed today:   Goals Addressed               This Visit's Progress     Patient Stated     manage constipation ([facility]) (pt-stated)   On track     Care Coordination Interventions: Evaluation of current treatment plan related to chronic constipation and patient's adherence to plan as established by provider Advised patient to continue to take in daily variety of fruits to help soften stools. Discussed intake of different fruit once a day vs 3-4 plums a day to prevent hypersensitivity reaction  Added plum hypersensitivity to allergy list Assisted to reset password successfully to get him back in his my chart. Discussed the access to merge his [facility], [facility] and [name] charts        Our next appointment is by telephone on 09/04/22 at 2 pm  Please call the care guide team at [phone] if you need to cancel or reschedule your appointment.   If you are experiencing a Mental Health or [place] or need someone to talk to, please call the Suicide and Crisis Lifeline: 988 call the [place] National Suicide Prevention Lifeline: (867)468-4906 or TTY: 609 835 9029 TTY 520-103-0367) to talk to a trained counselor call 1-800-273-TALK (toll free, 24 hour hotline) go to [facility] [address] [place] [phone]) call 911   Patient verbalizes understanding of instructions and care plan provided today and agrees to view in [place]. Active MyChart status and patient understanding of how to access instructions and care plan via MyChart confirmed with patient.     The patient has been provided with contact information for the care management team and has been advised to call with any health related questions or concerns.   Avianna Moynahan [name], RN, BSN, [place]  Coordinator Office number [phone]

## 2022-09-01 ENCOUNTER — Ambulatory Visit (HOSPITAL_BASED_OUTPATIENT_CLINIC_OR_DEPARTMENT_OTHER): Payer: Medicare Other | Admitting: Internal Medicine

## 2022-09-04 ENCOUNTER — Ambulatory Visit: Payer: Self-pay | Admitting: *Deleted

## 2022-09-04 NOTE — Patient Outreach (Signed)
  Care Coordination   Follow Up Visit Note   09/04/2022 Name: [name] MRN: [id number] DOB: 08-20-1966  [name] is a 56 y.o. year old male who sees [name] Perna, NP for primary care. I spoke with  [name], academic by phone today.  What matters to the patients health and wellness today?  Sleep study missed appointment, transportation to future appointments    Goals Addressed               This Visit's Progress     Patient Stated     manage constipation [facility]) (pt-stated)   On track     Care Coordination Interventions: Evaluation of current treatment plan related to chronic constipation and patient's adherence to plan as established by provider Added plum hypersensitivity to allergy list Confirmed patient is managing constipation better with intake of more variety of fruits      Obtain sleep study test [facility]) (pt-stated)   Not on track     Confirmed patient had to reschedule his sleep study as he arrived to the wrong location on 08/31/22 Reviewed the correct location, on [facility] property He confirms he has the address, number and understood the location for the sleep study office Attempt to get patient a DSS transportation ride to the sleep study office but denied by DSS staff Confirmed patient will be able to take the bus to get to location       Patient will obtain a stretcher transported visit to wound care center + other MD visit via medicaid transportation [facility]) (pt-stated)   On track     Care Coordination Interventions: Discussed plans with patient for ongoing care management follow up and provided patient with direct contact information for care management team Encouragement provided. Attempt to schedule 05/13/22 [facility] stretcher transport to Dr Welton Flakes. Confirmed with [facility] staff that it is too early (call at least 24 hours before stretcher visits)         SDOH assessments and interventions completed:  No     Care Coordination  Interventions:  Yes, provided   Follow up plan: Follow up call scheduled for 09/08/22    Encounter Outcome:  Pt. Visit Completed   Tuwana Kapaun [name], RN, BSN, [place] Coordinator Office number [phone]

## 2022-09-04 NOTE — Patient Instructions (Addendum)
Visit Information  Thank you for taking time to visit with me today. Please don't hesitate to contact me if I can be of assistance to you.   Following are the goals we discussed today:   Goals Addressed               This Visit's Progress     Patient Stated     manage constipation [facility]) (pt-stated)   On track     Care Coordination Interventions: Evaluation of current treatment plan related to chronic constipation and patient's adherence to plan as established by provider Added plum hypersensitivity to allergy list Confirmed patient is managing constipation better with intake of more variety of fruits      Obtain sleep study test [facility]) (pt-stated)   Not on track     Confirmed patient had to reschedule his sleep study as he arrived to the wrong location on 08/31/22 Reviewed the correct location, on [facility] property He confirms he has the address, number and understood the location for the sleep study office Attempt to get patient a DSS transportation ride to the sleep study office but denied by DSS staff Confirmed patient will be able to take the bus to get to location       Patient will obtain a stretcher transported visit to wound care center + other MD visit via medicaid transportation [facility]) (pt-stated)   On track     Care Coordination Interventions: Discussed plans with patient for ongoing care management follow up and provided patient with direct contact information for care management team Encouragement provided. Attempt to schedule 05/13/22 [facility] stretcher transport to Dr Welton Flakes. Confirmed with PTAR staff that it is too early (call at least 24 hours before stretcher visits)         Our next appointment is by telephone on 09/08/22 at 3 pm  Please call the care guide team at [phone] if you need to cancel or reschedule your appointment.   If you are experiencing a Mental Health or [place] or need someone to talk to, please call the Suicide  and Crisis Lifeline: 988 call the [place] National Suicide Prevention Lifeline: 8781921078 or TTY: 262 635 6580 TTY (979) 745-7187) to talk to a trained counselor call 1-800-273-TALK (toll free, 24 hour hotline) go to [facility] [address] [place] [phone]) call 911   Patient verbalizes understanding of instructions and care plan provided today and agrees to view in [place]. Active MyChart status and patient understanding of how to access instructions and care plan via MyChart confirmed with patient.     The patient has been provided with contact information for the care management team and has been advised to call with any health related questions or concerns.   Brenden Rudman [name], RN, BSN, [place] Coordinator Office number [phone]

## 2022-09-06 ENCOUNTER — Ambulatory Visit (HOSPITAL_BASED_OUTPATIENT_CLINIC_OR_DEPARTMENT_OTHER): Payer: Medicare Other | Attending: Primary Care | Admitting: Internal Medicine

## 2022-09-06 VITALS — Ht 73.0 in | Wt 300.0 lb

## 2022-09-06 DIAGNOSIS — G4736 Sleep related hypoventilation in conditions classified elsewhere: Secondary | ICD-10-CM | POA: Diagnosis not present

## 2022-09-06 DIAGNOSIS — G4733 Obstructive sleep apnea (adult) (pediatric): Secondary | ICD-10-CM | POA: Diagnosis not present

## 2022-09-08 ENCOUNTER — Ambulatory Visit: Payer: Self-pay | Admitting: *Deleted

## 2022-09-08 NOTE — Patient Outreach (Signed)
  Care Coordination   Transportation intervention  Visit Note   09/08/2022 Name: [name] MRN: [id number] DOB: 17-Mar-1966  [name] is a 56 y.o. year old male who sees [name], NP for primary care. I  spoke with [facility] staff, [name] about patient's 11/13/21 transport  What matters to the patients health and wellness today?  Stretcher transportation to wound care on 09/12/22    Goals Addressed               This Visit's Progress     Patient Stated     Patient will obtain a stretcher transported visit to wound care center + other MD visit via medicaid transportation [facility]) (pt-stated)        Care Coordination Interventions: Discussed plans with patient for ongoing care management follow up and provided patient with direct contact information for care management team Encouragement provided. Attempt to schedule 09/12/22 (correction) [facility] stretcher transport to Dr Welton Flakes. Confirmed with PTAR staff [name] that it is too early (call at least 24 hours before stretcher visits)         SDOH assessments and interventions completed:  No     Care Coordination Interventions:  Yes, provided   Follow up plan: Follow up call scheduled for 09/11/22    Encounter Outcome:  Pt. Visit Completed   Kamdin Follett [name], RN, BSN, [place] Coordinator Office number [phone]

## 2022-09-08 NOTE — Patient Instructions (Signed)
Visit Information  Thank you for taking time to visit with me today. Please don't hesitate to contact me if I can be of assistance to you.   Following are the goals we discussed today:   Goals Addressed               This Visit's Progress     Patient Stated     Patient will obtain a stretcher transported visit to wound care center + other MD visit via medicaid transportation [facility]) (pt-stated)        Care Coordination Interventions: Discussed plans with patient for ongoing care management follow up and provided patient with direct contact information for care management team Encouragement provided. Attempt to schedule 09/12/22 (correction) [facility] stretcher transport to Dr Welton Flakes. Confirmed with PTAR staff [place] that it is too early (call at least 24 hours before stretcher visits)         Our next appointment is by telephone on 09/11/22 at [place]  Please call the care guide team at [phone] if you need to cancel or reschedule your appointment.   If you are experiencing a Mental Health or [place] or need someone to talk to, please call the Suicide and Crisis Lifeline: 988 call the [place] National Suicide Prevention Lifeline: (806)405-4652 or TTY: (940) 845-9809 TTY 217 811 4088) to talk to a trained counselor call 1-800-273-TALK (toll free, 24 hour hotline) go to [facility] [address] [place] [phone]) call 911   Patient verbalizes understanding of instructions and care plan provided today and agrees to view in [place]. Active MyChart status and patient understanding of how to access instructions and care plan via MyChart confirmed with patient.     The patient has been provided with contact information for the care management team and has been advised to call with any health related questions or concerns.   Meyer Dockery [name], RN, BSN, [place] Coordinator Office number [phone]

## 2022-09-11 ENCOUNTER — Ambulatory Visit: Payer: Self-pay | Admitting: *Deleted

## 2022-09-11 NOTE — Patient Outreach (Addendum)
  Care Coordination   Follow Up Visit Note   09/11/2022 Name: [name] MRN: [id number] DOB: 1965/11/16  [name] is a 56 y.o. year old male who sees [name], NP for primary care. I  PTAR staff Pern to schedule a stretcher transport to Dr Welton Flakes for 09/12/22  What matters to the patients health and wellness today?  Completion of stretcher transportation via PT[facility] to wound [place] in [place] with Dr Welton Flakes    Goals Addressed               This Visit's Progress     Patient Stated     Patient will obtain a stretcher transported visit to wound care center + other MD visit via medicaid transportation [facility]) (pt-stated)   On track     Care Coordination Interventions: Discussed plans with patient for ongoing care management follow up and provided patient with direct contact information for care management team Encouragement provided. Scheduled the 09/12/22 0930 PTAR stretcher transport to Dr Welton Flakes, [facility] wound care. Confirmed with PTAR staff, Pern         SDOH assessments and interventions completed:  No     Care Coordination Interventions:  Yes, provided    Follow up plan: Follow up call scheduled for 10/09/22    Encounter Outcome:  Pt. Visit Completed   Breanda Greenlaw [name], RN, BSN, [place] Coordinator Office number [phone]

## 2022-09-11 NOTE — Patient Instructions (Addendum)
Visit Information  Thank you for taking time to visit with me today. Please don't hesitate to contact me if I can be of assistance to you.   Following are the goals we discussed today:   Goals Addressed               This Visit's Progress     Patient Stated     Patient will obtain a stretcher transported visit to wound care center + other MD visit via medicaid transportation [facility]) (pt-stated)   On track     Care Coordination Interventions: Discussed plans with patient for ongoing care management follow up and provided patient with direct contact information for care management team Encouragement provided. Scheduled the 09/12/22 0930 [facility] stretcher transport to Dr Welton Flakes, [facility] wound care. Confirmed with PTAR staff, Pern         Our next appointment is by telephone on 10/09/22 at 3 pm  Please call the care guide team at [phone] if you need to cancel or reschedule your appointment.   If you are experiencing a Mental Health or [place] or need someone to talk to, please call the Suicide and Crisis Lifeline: 988 call the [place] National Suicide Prevention Lifeline: 760-566-3130 or TTY: 4082133894 TTY [phone]) to talk to a trained counselor call 1-800-273-TALK (toll free, 24 hour hotline) go to [facility] [address] [place] [phone]) call 911   Patient verbalizes understanding of instructions and care plan provided today and agrees to view in [place]. Active MyChart status and patient understanding of how to access instructions and care plan via MyChart confirmed with patient.     The patient has been provided with contact information for the care management team and has been advised to call with any health related questions or concerns.   Kailyn Dubie [name], RN, BSN, [place] Coordinator Office number [phone]

## 2022-09-12 DIAGNOSIS — Z933 Colostomy status: Secondary | ICD-10-CM | POA: Diagnosis not present

## 2022-09-12 DIAGNOSIS — G4733 Obstructive sleep apnea (adult) (pediatric): Secondary | ICD-10-CM | POA: Diagnosis not present

## 2022-09-12 DIAGNOSIS — N319 Neuromuscular dysfunction of bladder, unspecified: Secondary | ICD-10-CM | POA: Diagnosis not present

## 2022-09-12 DIAGNOSIS — L89154 Pressure ulcer of sacral region, stage 4: Secondary | ICD-10-CM | POA: Diagnosis not present

## 2022-09-12 DIAGNOSIS — L89314 Pressure ulcer of right buttock, stage 4: Secondary | ICD-10-CM | POA: Diagnosis not present

## 2022-09-13 ENCOUNTER — Telehealth: Payer: Self-pay | Admitting: *Deleted

## 2022-09-13 NOTE — Patient Instructions (Signed)
Visit Information  Thank you for taking time to visit with me today. Please don't hesitate to contact me if I can be of assistance to you.   Following are the goals we discussed today:   Goals Addressed               This Visit's Progress     Patient Stated     obtain CPAP & supplies  ([facility]) (pt-stated)   On track     Goal updated to obtain CPAP & supplies  Goal meet for obtain sleep study   Confirmed patient will be able to take the bus to to obtain his home sleep study equipment and take it back Confirmed he is awaiting CPAP & supplies      Patient will obtain a stretcher transported visit to wound care center + other MD visit via medicaid transportation [facility]) (pt-stated)   Not on track     Care Coordination Interventions: Discussed plans with patient for ongoing care management follow up and provided patient with direct contact information for care management team Encouragement provided. Received a call from patient who was informed by an [facility] [place] staff that his standing order for weekly Wednesday 0830 transportation "has expired" therefore he was not able to be transported to his clinic appointment for 09/13/22. He had to take the bus Outreach to [place] transportation services [phone] no answer with wait time 38 minutes Outreach to [employer] transportation services again Spoke with [name] who initially could not identify an expired transportation service. Patient was called to conference in. [name] was able to renew the standing order for weekly Wednesday morning transportation to [place] with [name] plus confirm his 09/18/22 0815 transport is scheduled.   Discussed his January 2024 appointment to return [place] Dr Welton Flakes [place] [facility]        Our next appointment is by telephone on 10/09/21 at 3 pm  Please call the care guide team at [phone] if you need to cancel or reschedule your appointment.   If you are experiencing a Mental  Health or [place] or need someone to talk to, please call the Suicide and Crisis Lifeline: 988 call the [place] National Suicide Prevention Lifeline: 830-492-1996 or TTY: 9035242518 TTY [phone]) to talk to a trained counselor call 1-800-273-TALK (toll free, 24 hour hotline) go to [facility] [address] [place] [phone]) call 911   Patient verbalizes understanding of instructions and care plan provided today and agrees to view in [place]. Active MyChart status and patient understanding of how to access instructions and care plan via MyChart confirmed with patient.     The patient has been provided with contact information for the care management team and has been advised to call with any health related questions or concerns.   Dailah Opperman [name], RN, BSN, [place] Coordinator Office number [phone]

## 2022-09-13 NOTE — Patient Outreach (Signed)
  Care Coordination   [name] coordination  Visit Note   09/13/2022 Name: [name] MRN: [id number] DOB: 1966-09-30  [name] is a 56 y.o. year old male who sees [name] Perna, NP for primary care. I spoke with  [name], academic by phone today and staff at the [name] [name] [name]   What matters to the patients health and wellness today?  Medicaid [name] standing order for [place] Wednesday 0830 appointments Patient was informed by an [name] [name] staff that his [name] [name] "expired"      Goals Addressed               This Visit's Progress     Patient Stated     obtain CPAP & supplies  ([facility]) (pt-stated)   On track     Goal updated to obtain CPAP & supplies  Goal meet for obtain sleep study   Confirmed patient will be able to take the bus to to obtain his home sleep study equipment and take it back Confirmed he is awaiting CPAP & supplies      Patient will obtain a stretcher transported visit to wound care center + other MD visit via medicaid [name]) (pt-stated)   Not on track     Care Coordination Interventions: Discussed plans with patient for ongoing care management follow up and provided patient with direct contact information for care management team Encouragement provided. Received a call from patient who was informed by an [name] staff that his standing order for weekly Wednesday 0830 [name] "has expired" therefore he was not able to be transported to his clinic appointment for 09/13/22. He had to take the bus Outreach to [name] [phone] no answer with wait time 38 minutes Outreach to [name] again Spoke with [name] who initially could not identify an expired [name] service. Patient was called to conference in. [name] was able to renew the standing order for weekly Wednesday  morning [name] to [place] with [name] plus confirm his 09/18/22 0815 transport is scheduled.   Discussed his January 2024 appointment to return to [name] [facility]        SDOH assessments and interventions completed:  Yes  SDOH Interventions Today    Flowsheet Row Most Recent Value  SDOH Interventions   [name] Interventions Other (Comment)  [name]]        Care Coordination Interventions:  Yes, provided   Follow up plan: Follow up call scheduled for 10/09/21    Encounter Outcome:  Pt. Visit Completed   [name] L. [name] Hamman, RN, BSN, [place] Coordinator Office number [phone]

## 2022-09-15 ENCOUNTER — Ambulatory Visit: Payer: Self-pay | Admitting: *Deleted

## 2022-09-15 DIAGNOSIS — L89894 Pressure ulcer of other site, stage 4: Secondary | ICD-10-CM | POA: Diagnosis not present

## 2022-09-15 NOTE — Patient Outreach (Signed)
  Care Coordination   [facility] [place]  Visit Note   09/15/2022 Name: Sahil Milner MRN: [id number] DOB: 1965-10-30  Alva Facundo is a 56 y.o. year old male who sees [name], NP for primary care. I  received a call from [place]  What matters to the patients health and wellness today?  Redirect [facility] transportation calls received to patient's number     Goals Addressed               This Visit's Progress     Patient Stated     Patient will obtain a stretcher transported visit to wound care center + other MD visit via medicaid transportation [facility]) (pt-stated)   On track     Care Coordination Interventions: Discussed plans with patient for ongoing care management follow up and provided patient with direct contact information for care management team Received a call from george & redirect [facility] transportation calls received to patient's number           SDOH assessments and interventions completed:  No     Care Coordination Interventions:  Yes, provided   Follow up plan: Follow up call scheduled for 10/09/21    Encounter Outcome:  Pt. Visit Completed   Chanteria Haggard [name], RN, BSN, [place] Coordinator Office number [phone]

## 2022-09-16 DIAGNOSIS — G4733 Obstructive sleep apnea (adult) (pediatric): Secondary | ICD-10-CM | POA: Diagnosis not present

## 2022-09-16 NOTE — Procedures (Signed)
    Patient Name: [name], [name] Date: 09/06/2022 Gender: Male D.O.B: [birth year] Age (years): 41 Referring Provider: [name] NP Height (inches): 62 Interpreting Physician: Baird Lyons MD, ABSM Weight (lbs): 300 RPSGT: Jacolyn Reedy BMI: 40 MRN: [id number] Neck Size: 18.00  CLINICAL INFORMATION Sleep Study Type: HST Indication for sleep study: OSA Epworth Sleepiness Score: 3  SLEEP STUDY TECHNIQUE A multi-channel overnight portable sleep study was performed. The channels recorded were: nasal airflow, thoracic respiratory movement, and oxygen saturation with a pulse oximetry. Snoring was also monitored.  MEDICATIONS Patient self administered medications include: none reported.  SLEEP ARCHITECTURE Patient was studied for 372.4 minutes. The sleep efficiency was 100.0 % and the patient was supine for 0%. The arousal index was 0.0 per hour.  RESPIRATORY PARAMETERS The overall AHI was 91.7 per hour, with a central apnea index of 0 per hour. The oxygen nadir was 47% during sleep.  CARDIAC DATA Mean heart rate during sleep was 69.2 bpm.  IMPRESSIONS - Severe obstructive sleep apnea occurred during this study (AHI = 91.7/h). - Severe oxygen desaturation was noted during this study (Min O2 = 47%). Mean 95% - Patient snored.  DIAGNOSIS - Obstructive Sleep Apnea (G47.33) - Nocturnal Hypoxemia (G47.36)  RECOMMENDATIONS - Suggest CPAP titration sleep study or autopap. Other options would be based on clinical judgment. - Be careful with alcohol, sedatives and other CNS depressants that may worsen sleep apnea and disrupt normal sleep architecture. - Sleep hygiene should be reviewed to assess factors that may improve sleep quality. - Weight management and regular exercise should be initiated or continued.  [Electronically signed] 09/16/2022 12:41 PM  Baird Lyons MD, [place], American Board of Sleep Medicine NPI: [id number]                          [place], [place] of Sleep Medicine  ELECTRONICALLY SIGNED ON:  09/16/2022, 12:24 PM [place] PH: [phone]   FX: [phone] [place]

## 2022-09-18 DIAGNOSIS — N360 Urethral fistula: Secondary | ICD-10-CM | POA: Diagnosis not present

## 2022-09-18 DIAGNOSIS — Z9889 Other specified postprocedural states: Secondary | ICD-10-CM | POA: Diagnosis not present

## 2022-09-22 ENCOUNTER — Telehealth (INDEPENDENT_AMBULATORY_CARE_PROVIDER_SITE_OTHER): Payer: Self-pay | Admitting: Primary Care

## 2022-09-22 ENCOUNTER — Ambulatory Visit (INDEPENDENT_AMBULATORY_CARE_PROVIDER_SITE_OTHER): Payer: Self-pay | Admitting: *Deleted

## 2022-09-22 NOTE — Telephone Encounter (Signed)
Ronalee Belts from [employer] is calling to follow up on the ostomy supplies order that was faxed yesterday. Ronalee Belts stated pt needs supplies as soon as possible. Ronalee Belts asked, please.  Sign and date form and ICD 10 code is needed z43.3 or z43.2 .  Please advise.

## 2022-09-22 NOTE — Telephone Encounter (Signed)
[name] with [employer] [employer] has called to provide additional diagnosis codes needed for the completion of the forms  The codes are   Primary Diagnosis Code for ileostomy  Z43.2 colostomy Z43.3  urostomy Z43.6   One of the following codes must be included for the form to be valid   The form must also be signed and dated  This form is needed for the patient's order to be shipped

## 2022-09-22 NOTE — Telephone Encounter (Signed)
Reason for Disposition  [1] Caller has URGENT medicine question about med that PCP or specialist prescribed AND [2] triager unable to answer question    Been requesting ostomy supplies all week but no response from provider now he is out of ostomy supplies over the Christmas holidays.  Answer Assessment - Initial Assessment Questions 1. NAME of MEDICINE: "What medicine(s) are you calling about?"     He is out of ostomy supplies.   Pt called yesterday but did not get a response from Juluis Mire, NP for an order .    12/152023 and 09/21/2022 another request was sent.    2. QUESTION: "What is your question?" (e.g., double dose of medicine, side effect)     I've been calling since last week for this.   Why is Juluis Mire, NP not responding to my requests for the  ostomy supplies.    I suggested he go to the ED and see if he can get supplies from there.  "They won't give me supplies like that".   "They tell me since I have a doctor I have to get my supplies from her".    "No use to go to the ED".    I apologized to him for the inconvenience however I could not answer his question as to why his response had not been addressed.     Why is she not responding to me?    This really upsets me that she leaves me in this kind of situation when I've been asking all week for these supplies and calling.    Pt very upset and hung up after saying what he did.  3. PRESCRIBER: "Who prescribed the medicine?" Reason: if prescribed by specialist, call should be referred to that group.     Juluis Mire, NP 4. SYMPTOMS: "Do you have any symptoms?" If Yes, ask: "What symptoms are you having?"  "How bad are the symptoms (e.g., mild, moderate, severe)     Out of ostomy supplies 5. PREGNANCY:  "Is there any chance that you are pregnant?" "When was your last menstrual period?"     N/A  Protocols used: Medication Question Call-A-AH

## 2022-09-26 NOTE — Telephone Encounter (Signed)
Called to inform that forms were not received since speaking to them [facility] at 1026.   Requesting that forms be refaxed.

## 2022-09-26 NOTE — Telephone Encounter (Signed)
Asked representative to fax forms to [facility] since no staff was at [facility] that could start paper work.   Informed that this would be a one time request to have paperwork faxed.   [phone], ATTNCarilyn Goodpasture

## 2022-09-28 ENCOUNTER — Encounter (INDEPENDENT_AMBULATORY_CARE_PROVIDER_SITE_OTHER): Payer: Self-pay | Admitting: Primary Care

## 2022-09-28 DIAGNOSIS — Z433 Encounter for attention to colostomy: Secondary | ICD-10-CM | POA: Diagnosis not present

## 2022-09-28 DIAGNOSIS — Z933 Colostomy status: Secondary | ICD-10-CM | POA: Diagnosis not present

## 2022-09-28 NOTE — Telephone Encounter (Unsigned)
Copied from [place] [phone]. Topic: General - Other >> Sep 28, 2022  9:21 AM Everette C wrote: Reason for CRM: Beverly with [employer] has called to share that the recently received request for the patient's ostomy supplies has been returned but the first page with [place]. Edward's signature and date was cut off, page 2 will need to be resubmitted to [facility] when possible at [phone] Attention Nelda Bucks has stressed the urgency of their request due to the patient being out of supplies   Please contact further if needed

## 2022-09-28 NOTE — Telephone Encounter (Signed)
Copied from [place] [phone]. Topic: General - Other >> Sep 20, 2022 11:59 AM [place] B wrote: Reason for CRM: Patient called in states is out of ostomy supplies and is checking on status of request sent twice,  one was sent on Dec 15. Please call back with status

## 2022-09-28 NOTE — Telephone Encounter (Signed)
Faxed on 09/27/2022 and again on 09/28/2022. Confirmation page received.

## 2022-09-28 NOTE — Telephone Encounter (Signed)
Faxed forms to [place].  Confirmation received.  Patient aware.

## 2022-10-04 ENCOUNTER — Other Ambulatory Visit (INDEPENDENT_AMBULATORY_CARE_PROVIDER_SITE_OTHER): Payer: Self-pay | Admitting: Primary Care

## 2022-10-04 DIAGNOSIS — K219 Gastro-esophageal reflux disease without esophagitis: Secondary | ICD-10-CM

## 2022-10-04 NOTE — Telephone Encounter (Signed)
Medication Refill - Medication: famotidine (PEPCID) 40 MG tablet   Has the patient contacted their pharmacy? Yes.   (Agent: If no, request that the patient contact the pharmacy for the refill. If patient does not wish to contact the pharmacy document the reason why and proceed with request.) (Agent: If yes, when and what did the pharmacy advise?)  Preferred Pharmacy (with phone number or street name):  [facility], [place] Phone: [phone]  Fax: [phone]     Has the patient been seen for an appointment in the last year OR does the patient have an upcoming appointment? Yes.    Agent: Please be advised that RX refills may take up to 3 business days. We ask that you follow-up with your pharmacy.

## 2022-10-05 ENCOUNTER — Other Ambulatory Visit (INDEPENDENT_AMBULATORY_CARE_PROVIDER_SITE_OTHER): Payer: Self-pay | Admitting: Primary Care

## 2022-10-05 ENCOUNTER — Ambulatory Visit (INDEPENDENT_AMBULATORY_CARE_PROVIDER_SITE_OTHER): Payer: Self-pay

## 2022-10-05 DIAGNOSIS — R140 Abdominal distension (gaseous): Secondary | ICD-10-CM

## 2022-10-05 DIAGNOSIS — Z933 Colostomy status: Secondary | ICD-10-CM

## 2022-10-05 DIAGNOSIS — K219 Gastro-esophageal reflux disease without esophagitis: Secondary | ICD-10-CM

## 2022-10-05 MED ORDER — FAMOTIDINE 40 MG PO TABS: 40.0000 mg | ORAL_TABLET | Freq: Every day | ORAL | 0 refills | Status: AC

## 2022-10-05 NOTE — Telephone Encounter (Signed)
  Chief Complaint: Needs medication Symptoms:  Frequency:  Pertinent Negatives: Patient denies  Disposition: '[]'$ ED /'[]'$ Urgent Care (no appt availability in office) / '[]'$ Appointment(In office/virtual)/ '[]'$  [place] Virtual [facility]/ '[]'$ Home Care/ '[]'$ Refused Recommended Disposition /'[]'$  Mobile Bus/ '[x]'$  Follow-up with PCP Additional Notes: PT needs refill of Famotidine.  Refill request sent to office yesterday. PEC unable to refill as rx was signed by another provider.   Pt also noted that rx should be for 2 times daily not once daily.  Pt states he cannot eat without this medication.  Please advise.      Reason for Disposition  [1] Prescription refill request for ESSENTIAL medicine (i.e., likelihood of harm to patient if not taken) AND [2] triager unable to refill per department policy  Answer Assessment - Initial Assessment Questions 1. DRUG NAME: "What medicine do you need to have refilled?"     Famotidine  2. REFILLS REMAINING: "How many refills are remaining?" (Note: The label on the medicine or pill bottle will show how many refills are remaining. If there are no refills remaining, then a renewal may be needed.)     0 3. EXPIRATION DATE: "What is the expiration date?" (Note: The label states when the prescription will expire, and thus can no longer be refilled.)     0 4. PRESCRIBING HCP: "Who prescribed it?" Reason: If prescribed by specialist, call should be referred to that group.     His provider 5. SYMPTOMS: "Do you have any symptoms?"     yes 6. PREGNANCY: "Is there any chance that you are pregnant?" "When was your last menstrual period?"  Protocols used: Medication Refill and Renewal Call-A-AH

## 2022-10-05 NOTE — Telephone Encounter (Signed)
Requested medication (s) are due for refill today: routing for review  Requested medication (s) are on the active medication list: yes  Last refill:  01/09/22  Future visit scheduled:yes  Notes to clinic:  Unable to refill per protocol, last refill by another provider.      Requested Prescriptions  Pending Prescriptions Disp Refills   famotidine (PEPCID) 20 MG tablet 90 tablet 2    Sig: Take 1 tablet (20 mg total) by mouth daily.     Gastroenterology:  H2 Antagonists Passed - 10/04/2022  1:25 PM      Passed - Valid encounter within last 12 months    Recent Outpatient Visits           4 months ago Colostomy in place for fecal diversion   [place] [name], NP   6 months ago Morbid obesity with BMI of 40.0-44.9, adult ([place])   [place] [name], NP   1 year ago Encounter to establish care   [place], Vernia Buff, NP

## 2022-10-09 ENCOUNTER — Encounter: Payer: Self-pay | Admitting: *Deleted

## 2022-10-09 ENCOUNTER — Ambulatory Visit: Payer: Self-pay | Admitting: *Deleted

## 2022-10-09 ENCOUNTER — Other Ambulatory Visit: Payer: Self-pay | Admitting: Student

## 2022-10-09 NOTE — Patient Outreach (Signed)
  Care Coordination   Follow Up Visit Note   10/09/2022 Name: [name] MRN: [id number] DOB: October 07, 1965  [name] is a 57 y.o. year old male who sees [name], NP for primary care. I spoke with  Snyder Librarian, academic by phone today.  What matters to the patients health and wellness today?  Transportation to Appts    Goals Addressed               This Visit's Progress     Patient Stated     Patient will obtain a stretcher transported visit to wound care center + other MD visit via medicaid transportation [facility]) (pt-stated)        Care Coordination Interventions: Reviewed upcoming appointments with patient and verified locations Wound Care 10/17/22 at [facility], [place], [place] [address], Ph [phone] Urology 10/19/22 at 10:30  [facility] Urology [place], [place] [address], [phone] Talked with [place] [phone] and verified that they do provide patient with stretchert transport services Set up transportation through [place] Updated patient that transportation has been arranged and that he should be outreached by Alta Corning to verify Provided with [facility] telephone number [phone] and encouraged to reach out if needed        SDOH assessments and interventions completed:  Yes SDOH Interventions Today    Flowsheet Row Most Recent Value  SDOH Interventions   Housing Interventions Intervention Not Indicated  Transportation Interventions Other (Comment)  [Arranged transportation to wound care and urology appt through [facility] DSS]  Physical Activity Interventions Other (Comments)  [paraplegic]        Care Coordination Interventions:  Yes, provided   Follow up plan: Follow up call scheduled for 11/09/22 with [name], [facility]    Encounter Outcome:  Pt. Visit Completed   Chong Sicilian, BSN, RN-BC RN Care Coordinator [place] Direct Dial: [phone] Main #: [phone]

## 2022-10-10 NOTE — Telephone Encounter (Signed)
Saw [place] in 2022. But ok to send to PCP.

## 2022-10-13 DIAGNOSIS — L89154 Pressure ulcer of sacral region, stage 4: Secondary | ICD-10-CM | POA: Diagnosis not present

## 2022-10-13 DIAGNOSIS — L89314 Pressure ulcer of right buttock, stage 4: Secondary | ICD-10-CM | POA: Diagnosis not present

## 2022-10-13 DIAGNOSIS — N319 Neuromuscular dysfunction of bladder, unspecified: Secondary | ICD-10-CM | POA: Diagnosis not present

## 2022-10-13 DIAGNOSIS — G4733 Obstructive sleep apnea (adult) (pediatric): Secondary | ICD-10-CM | POA: Diagnosis not present

## 2022-10-13 DIAGNOSIS — Z933 Colostomy status: Secondary | ICD-10-CM | POA: Diagnosis not present

## 2022-10-13 DIAGNOSIS — Z433 Encounter for attention to colostomy: Secondary | ICD-10-CM | POA: Diagnosis not present

## 2022-10-18 DIAGNOSIS — L89894 Pressure ulcer of other site, stage 4: Secondary | ICD-10-CM | POA: Diagnosis not present

## 2022-10-19 ENCOUNTER — Other Ambulatory Visit: Payer: Self-pay | Admitting: Student

## 2022-10-19 DIAGNOSIS — M109 Gout, unspecified: Secondary | ICD-10-CM

## 2022-10-23 DIAGNOSIS — M545 Low back pain, unspecified: Secondary | ICD-10-CM | POA: Diagnosis not present

## 2022-10-23 DIAGNOSIS — M25551 Pain in right hip: Secondary | ICD-10-CM | POA: Diagnosis not present

## 2022-10-23 DIAGNOSIS — Z79899 Other long term (current) drug therapy: Secondary | ICD-10-CM | POA: Diagnosis not present

## 2022-10-23 DIAGNOSIS — Z0000 Encounter for general adult medical examination without abnormal findings: Secondary | ICD-10-CM | POA: Diagnosis not present

## 2022-10-26 DIAGNOSIS — Z79899 Other long term (current) drug therapy: Secondary | ICD-10-CM | POA: Diagnosis not present

## 2022-10-30 DIAGNOSIS — G4733 Obstructive sleep apnea (adult) (pediatric): Secondary | ICD-10-CM | POA: Diagnosis not present

## 2022-11-03 DIAGNOSIS — M25551 Pain in right hip: Secondary | ICD-10-CM | POA: Diagnosis not present

## 2022-11-03 DIAGNOSIS — Z0000 Encounter for general adult medical examination without abnormal findings: Secondary | ICD-10-CM | POA: Diagnosis not present

## 2022-11-03 DIAGNOSIS — M545 Low back pain, unspecified: Secondary | ICD-10-CM | POA: Diagnosis not present

## 2022-11-03 DIAGNOSIS — Z79899 Other long term (current) drug therapy: Secondary | ICD-10-CM | POA: Diagnosis not present

## 2022-11-06 DIAGNOSIS — L89314 Pressure ulcer of right buttock, stage 4: Secondary | ICD-10-CM | POA: Diagnosis not present

## 2022-11-06 DIAGNOSIS — L89154 Pressure ulcer of sacral region, stage 4: Secondary | ICD-10-CM | POA: Diagnosis not present

## 2022-11-09 ENCOUNTER — Ambulatory Visit: Payer: Self-pay | Admitting: *Deleted

## 2022-11-09 DIAGNOSIS — Z933 Colostomy status: Secondary | ICD-10-CM | POA: Diagnosis not present

## 2022-11-09 DIAGNOSIS — Z79899 Other long term (current) drug therapy: Secondary | ICD-10-CM | POA: Diagnosis not present

## 2022-11-09 DIAGNOSIS — Z433 Encounter for attention to colostomy: Secondary | ICD-10-CM | POA: Diagnosis not present

## 2022-11-09 NOTE — Patient Outreach (Addendum)
  Care Coordination   Follow Up Visit Note   11/10/2022 Name: [name] MRN: [id number] DOB: 1966-07-29  [name] is a 57 y.o. year old male who sees [name], NP for primary care. I spoke with  [name], academic by phone today.  What matters to the patients health and wellness today?  Cancel 11/15/22 and reschedule 11/27/22 0800 as patient has a pain management clinic conflict with this date  Wound care great improvements, attended 11/06/22 visit Reports Dr Welton Flakes is very impressed with the home wound care that includes staying off his wound and keeping it clean  Grief loss a friend recently but processing well with his aide and other friends  Confirmed a listed 12/04/22 appointment is to be seen by Dr Francesca Jewett only, not Dr Welton Flakes he was seen by Dr Welton Flakes on 11/06/22    Goals Addressed               This Visit's Progress     Patient Stated     COMPLETED: manage constipation ([facility]) (pt-stated)   On track     Care Coordination Interventions: Evaluation of current treatment plan related to chronic constipation and patient's adherence to plan as established by provider Added plum hypersensitivity to allergy list Confirmed patient continues to  manage constipation Goal met      obtain CPAP & supplies  [facility] (pt-stated)   On track     Goal updated- completed sleep study and pending supplies        Patient will obtain a stretcher transported visit to wound care center + other MD visit via medicaid transportation [facility]) (pt-stated)   On track     Care Coordination Interventions: Reviewed upcoming appointments with patient and verified locations Urology 11/15/22 needed to be rescheduled to 11/27/22 at 0800 as patient has an appointment conflict  Spoke with Maree Krabbe to reschedule = verify location as [facility] Urology [address] [place], [place] [address], [phone]) [phone] Set up transportation through [place] [phone] Confirmed patient is also learning  to schedule more medical appointments independently         SDOH assessments and interventions completed:  Yes     Care Coordination Interventions:  Yes, provided  Interventions Today    Flowsheet Row Most Recent Value  Chronic Disease Discussed/Reviewed   Chronic disease discussed/reviewed during today's visit Other  [wound care for decub and ostomy appointments to doctors]  General Interventions   General Interventions Discussed/Reviewed General Interventions Reviewed, Doctor Visits  Doctor Visits Discussed/Reviewed Doctor Visits Discussed, Specialist  PCP/Specialist Visits Compliance with follow-up visit  Nutrition Interventions   Nutrition Discussed/Reviewed Nutrition Reviewed, Adding fruits and vegetables  Safety Interventions   Safety Discussed/Reviewed Safety Reviewed, Home Safety  Home Safety Assistive Devices       Follow up plan: Follow up call scheduled for 11/28/22    Encounter Outcome:  Pt. Visit Completed   Rache Klimaszewski [name], RN, BSN, [place] Coordinator Office number [phone]

## 2022-11-09 NOTE — Patient Instructions (Addendum)
Visit Information  Thank you for taking time to visit with me today. Please don't hesitate to contact me if I can be of assistance to you.   Following are the goals we discussed today:   Goals Addressed               This Visit's Progress     Patient Stated     COMPLETED: manage constipation ([facility]) (pt-stated)   On track     Care Coordination Interventions: Evaluation of current treatment plan related to chronic constipation and patient's adherence to plan as established by provider Added plum hypersensitivity to allergy list Confirmed patient continues to  manage constipation Goal met      obtain CPAP & supplies  ([facility]) (pt-stated)   On track     Goal updated- completed sleep study and pending supplies        Patient will obtain a stretcher transported visit to wound care center + other MD visit via medicaid transportation [facility]) (pt-stated)   On track     Care Coordination Interventions: Reviewed upcoming appointments with patient and verified locations Urology 11/15/22 needed to be rescheduled to 11/27/22 at 0800 as patient has an appointment conflict  Spoke with Tarin to reschedule = verify location as [facility] Urology [address] [place], [place] [address], [phone]) [phone] Set up transportation through [place] [phone] Confirmed patient is also learning to schedule more medical appointments independently         Our next appointment is by telephone on 11/28/22 at 1100  Please call the care guide team at [phone] if you need to cancel or reschedule your appointment.   If you are experiencing a Mental Health or [place] or need someone to talk to, please call the Suicide and Crisis Lifeline: 988 call the [place] National Suicide Prevention Lifeline: 314 776 7717 or TTY: (308)318-6474 TTY 956-641-0717) to talk to a trained counselor call 1-800-273-TALK (toll free, 24 hour hotline) go to [facility] Urgent Care  [address] [place] [phone]) call 911   Patient verbalizes understanding of instructions and care plan provided today and agrees to view in [place]. Active MyChart status and patient understanding of how to access instructions and care plan via MyChart confirmed with patient.     The patient has been provided with contact information for the care management team and has been advised to call with any health related questions or concerns.    Jahred Tatar [name], RN, BSN, [place] Coordinator Office number [phone]

## 2022-11-10 ENCOUNTER — Ambulatory Visit: Payer: Self-pay | Admitting: *Deleted

## 2022-11-10 ENCOUNTER — Other Ambulatory Visit (HOSPITAL_COMMUNITY): Payer: Self-pay | Admitting: Family Medicine

## 2022-11-10 ENCOUNTER — Ambulatory Visit (HOSPITAL_COMMUNITY)
Admission: RE | Admit: 2022-11-10 | Discharge: 2022-11-10 | Disposition: A | Discharge status: Home / Self Care | Payer: 59 | Source: Ambulatory Visit | Attending: Family Medicine | Admitting: Family Medicine

## 2022-11-10 DIAGNOSIS — M25551 Pain in right hip: Secondary | ICD-10-CM | POA: Diagnosis not present

## 2022-11-10 DIAGNOSIS — M545 Low back pain, unspecified: Secondary | ICD-10-CM

## 2022-11-10 DIAGNOSIS — M47816 Spondylosis without myelopathy or radiculopathy, lumbar region: Secondary | ICD-10-CM | POA: Diagnosis not present

## 2022-11-10 NOTE — Patient Outreach (Signed)
  Care Coordination   11/10/2022 Name: [name] MRN: [id number] DOB: July 29, 1966   Care Coordination Outreach Attempts:  A second unsuccessful outreach was attempted today to offer the patient with information about available care coordination services as a benefit of their health plan.     Attempts to reach DSS [place] transportation again unsuccessful Long wait time without an answer at [phone]  Follow Up Plan:  Additional outreach attempts will be made to offer the patient care coordination information and services.   Encounter Outcome:  No Answer   Care Coordination Interventions:  Yes, provided     Rachyl Wuebker [name], RN, BSN, [place] Coordinator Office number [phone]

## 2022-11-13 ENCOUNTER — Ambulatory Visit: Payer: Self-pay | Admitting: *Deleted

## 2022-11-13 DIAGNOSIS — N319 Neuromuscular dysfunction of bladder, unspecified: Secondary | ICD-10-CM | POA: Diagnosis not present

## 2022-11-13 DIAGNOSIS — Z933 Colostomy status: Secondary | ICD-10-CM | POA: Diagnosis not present

## 2022-11-13 DIAGNOSIS — G4733 Obstructive sleep apnea (adult) (pediatric): Secondary | ICD-10-CM | POA: Diagnosis not present

## 2022-11-13 DIAGNOSIS — L89314 Pressure ulcer of right buttock, stage 4: Secondary | ICD-10-CM | POA: Diagnosis not present

## 2022-11-13 DIAGNOSIS — L89154 Pressure ulcer of sacral region, stage 4: Secondary | ICD-10-CM | POA: Diagnosis not present

## 2022-11-13 NOTE — Patient Outreach (Signed)
  Care Coordination   Care coordination  Visit Note   11/13/2022 Name: Kayla Weekes MRN: [id number] DOB: 12-18-1965  [name] is a 57 y.o. year old male who sees [name], NP for primary care. I  spoke with staff at [facility] transportation  What matters to the patients health and wellness today?  11/27/22 transportation for out of county ride to Dr Francesca Jewett for Nurse visit related to his urostomy    Goals Addressed               This Visit's Progress     Patient Stated     Patient will obtain a stretcher transported visit to wound care center + other MD visit via medicaid transportation [facility]) (pt-stated)   On track     Care Coordination Interventions: Reviewed upcoming appointments with patient and verified locations Urology 11/15/22 needed to be rescheduled to 11/27/22 at 0800 as patient has an appointment conflict  Spoke with Tarin to reschedule = verify location as [facility] Urology [place], [place] [address], [phone]) [phone] Set up transportation through [place] [phone] Confirmed patient is also learning to schedule more medical appointments independently         SDOH assessments and interventions completed:  No     Care Coordination Interventions:  Yes, provided   Follow up plan: Follow up call scheduled for 11/28/22    Encounter Outcome:  Pt. Visit Completed   Seraya Jobst [name], RN, BSN, [place] Coordinator Office number [phone]

## 2022-11-13 NOTE — Patient Instructions (Signed)
Visit Information  Thank you for taking time to visit with me today. Please don't hesitate to contact me if I can be of assistance to you.   Following are the goals we discussed today:   Goals Addressed               This Visit's Progress     Patient Stated     Patient will obtain a stretcher transported visit to wound care center + other MD visit via medicaid transportation [facility]) (pt-stated)   On track     Care Coordination Interventions: Reviewed upcoming appointments with patient and verified locations Urology 11/15/22 needed to be rescheduled to 11/27/22 at 0800 as patient has an appointment conflict  Spoke with Tarin to reschedule = verify location as [facility] Urology [place], [place] [address], [phone]) [phone] Set up transportation through [place] [phone] Confirmed patient is also learning to schedule more medical appointments independently         [place] next appointment is by telephone on 11/28/22 at 1100  Please call the care guide team at [phone] if you need to cancel or reschedule your appointment.   If you are experiencing a Mental Health or [place] or need someone to talk to, please call the Suicide and Crisis Lifeline: 988 call the [place] National Suicide Prevention Lifeline: (408) 428-1699 or TTY: (409) 289-0692 TTY 325-775-8821) to talk to a trained counselor call 1-800-273-TALK (toll free, 24 hour hotline) go to [facility] Urgent Care [address] [place] [phone]) call 911   Patient verbalizes understanding of instructions and care plan provided today and agrees to view in [place]. Active MyChart status and patient understanding of how to access instructions and care plan via MyChart confirmed with patient.     The patient has been provided with contact information for the care management team and has been advised to call with any health related questions or concerns.      Cyleigh Massaro [name], RN, BSN, [place] Coordinator Office number [phone]

## 2022-11-15 DIAGNOSIS — M25551 Pain in right hip: Secondary | ICD-10-CM | POA: Diagnosis not present

## 2022-11-15 DIAGNOSIS — M545 Low back pain, unspecified: Secondary | ICD-10-CM | POA: Diagnosis not present

## 2022-11-15 DIAGNOSIS — Z79899 Other long term (current) drug therapy: Secondary | ICD-10-CM | POA: Diagnosis not present

## 2022-11-27 DIAGNOSIS — N360 Urethral fistula: Secondary | ICD-10-CM | POA: Diagnosis not present

## 2022-11-27 DIAGNOSIS — Z466 Encounter for fitting and adjustment of urinary device: Secondary | ICD-10-CM | POA: Diagnosis not present

## 2022-11-28 ENCOUNTER — Ambulatory Visit: Payer: Self-pay | Admitting: *Deleted

## 2022-11-28 NOTE — Patient Outreach (Signed)
  Care Coordination   Follow Up Visit Note   11/29/2022 Name: [name] MRN: [id number] DOB: November 26, 1965  [name] is a 57 y.o. year old male who sees [name] Perna, NP for primary care. I spoke with  [name], academic by phone today.  What matters to the patients health and wellness today?  Confirmed he went to his 11/27/22 urology RN visit and returns on 12/04/22 to see [name]  He reports needing transportation for 12/19/22 0815 Tuesday for new pain management services at [facility] at [place] [phone] Does not go to methadone clinic any more  Wound care [name] very please with the healing of his wound He and his aide, "[name]" states the key is to keep it clean and to stay off his buttocks. He reports he only gets up in his wheelchair is when he has to go to [name] appointments. The patient reports Right ischial pressure sore is now stage 2 trending to a stage 1. pressure ulcerations on the pelvis/hip with healing  11/06/22 Wound care visit to [name], Patient declines debridement use prisma, aquacell ag +  High protein diet  + staying off his buttocks He has a new wheelchair cushion  Follow up is in 4 weeks from 11/06/22   His wheel chair provider will be visiting on this week   Goals Addressed               This Visit's Progress     Patient Stated     Patient will obtain a stretcher transported visit to wound care center + other MD visit via medicaid transportation [facility]) (pt-stated)        Care Coordination Interventions: Interventions Today    Flowsheet Row Most Recent Value  Chronic Disease   Chronic disease during today's visit Other  [wound care MD visits with transportation]  General Interventions   General Interventions Discussed/Reviewed Communication with  Doctor Visits Discussed/Reviewed Doctor Visits Discussed, Doctor Visits Reviewed, Specialist, PCP  [spoke about his preference for pcp, reviewed listed MD  appointments in [facility] [name] 12/04/22 1000 ? [name] wound care but 11/06/22 note states in 4 weeks]  PCP/Specialist Visits Compliance with follow-up visit  [pcp ? [facility] [address]]  Communication with --  [unsuccessful out reach to [facility] DSS transportaton with a 21 minute wait starting at [phone]]  Nutrition Interventions   Nutrition Discussed/Reviewed Nutrition Discussed, Increaing proteins      Set up transportation through [place]          SDOH assessments and interventions completed:  No     Care Coordination Interventions:  Yes, provided   Follow up plan: Follow up call scheduled for 12/08/22    Encounter Outcome:  Pt. Visit Completed   Zhania Shaheen [name], RN, BSN, [place] Coordinator Office number [phone]

## 2022-11-29 ENCOUNTER — Ambulatory Visit: Payer: Self-pay | Admitting: *Deleted

## 2022-11-29 DIAGNOSIS — G8220 Paraplegia, unspecified: Secondary | ICD-10-CM | POA: Diagnosis not present

## 2022-11-29 DIAGNOSIS — G9589 Other specified diseases of spinal cord: Secondary | ICD-10-CM | POA: Diagnosis not present

## 2022-11-29 DIAGNOSIS — L8994 Pressure ulcer of unspecified site, stage 4: Secondary | ICD-10-CM | POA: Diagnosis not present

## 2022-11-29 DIAGNOSIS — Z933 Colostomy status: Secondary | ICD-10-CM | POA: Diagnosis not present

## 2022-11-29 NOTE — Patient Instructions (Signed)
Visit Information  Thank you for taking time to visit with me today. Please don't hesitate to contact me if I can be of assistance to you.   Following are the goals we discussed today:   Goals Addressed               This Visit's Progress     Patient Stated     Patient will obtain a stretcher transported visit to wound care center + other MD visit via medicaid transportation [facility]) (pt-stated)   On track     Care Coordination Interventions: Interventions Today    Flowsheet Row Most Recent Value  Chronic Disease   Chronic disease during today's visit Other  [transportation to medical appointments]  General Interventions   General Interventions Discussed/Reviewed [employer]  Doctor Visits Discussed/Reviewed --  [25 MINUTE outreach for DSS transportation wait + assist with ride entry by Butch Penny today 11/29/22]  PCP/Specialist Visits --  [too early to call in transportation for 12/11/22, 12/15/22, 01/24/23 medical visits]  Communication with --  Donell Beers call to patient after leaving a voice message for his 12/04/22 transportation to Dr Iona Hansen at [phone] after multiple unsuccessful calls with long wait times. 12/28/22 spoke with Butch Penny to confirm the booking to Dr Iona Hansen for 12/04/22]     Set up transportation through [place] next appointment is by telephone on 12/08/22 at 0930  Please call the care guide team at [phone] if you need to cancel or reschedule your appointment.   If you are experiencing a Mental Health or [place] or need someone to talk to, please call the Suicide and Crisis Lifeline: 988 call the [place] National Suicide Prevention Lifeline: 618-333-8662 or TTY: 254-474-7589 TTY (850) 221-8211) to talk to a trained counselor call 1-800-273-TALK (toll free, 24 hour hotline) go to [facility] Urgent Care [address], [place] [phone]) call 911   Patient verbalizes  understanding of instructions and care plan provided today and agrees to view in [place]. Active MyChart status and patient understanding of how to access instructions and care plan via MyChart confirmed with patient.     The patient has been provided with contact information for the care management team and has been advised to call with any health related questions or concerns.   Bayle Calvo [name], RN, BSN, [place] Coordinator Office number [phone]

## 2022-11-29 NOTE — Patient Instructions (Addendum)
Visit Information  Thank you for taking time to visit with me today. Please don't hesitate to contact me if I can be of assistance to you.   Following are the goals we discussed today:   Goals Addressed               This Visit's Progress     Patient Stated     Patient will obtain a stretcher transported visit to wound care center + other MD visit via medicaid transportation [facility]) (pt-stated)        Care Coordination Interventions: Interventions Today    Flowsheet Row Most Recent Value  Chronic Disease   Chronic disease during today's visit Other  [wound care MD visits with transportation]  General Interventions   General Interventions Discussed/Reviewed Communication with  Doctor Visits Discussed/Reviewed Doctor Visits Discussed, Doctor Visits Reviewed, Specialist, PCP  [spoke about his preference for pcp, reviewed listed MD appointments in [facility] Dr Francesca Jewett 12/04/22 1000 ? Dr Welton Flakes wound care but 11/06/22 note states in 4 weeks]  PCP/Specialist Visits Compliance with follow-up visit  [pcp ? [facility] [address]]  Communication with --  [unsuccessful out reach to [place] transportaton with a 21 minute wait starting at [phone]]  Nutrition Interventions   Nutrition Discussed/Reviewed Nutrition Discussed, Increaing proteins      Set up transportation through [place] next appointment is by telephone on 12/08/22 at 0930  Please call the care guide team at [phone] if you need to cancel or reschedule your appointment.   If you are experiencing a Mental Health or [place] or need someone to talk to, please call the Suicide and Crisis Lifeline: 988 call the [place] National Suicide Prevention Lifeline: 873-670-4924 or TTY: 571-467-7473 TTY 956-489-9926) to talk to a trained counselor call 1-800-273-TALK (toll free, 24 hour hotline) go to [facility] [address], [place]  [phone]) call 911   Patient verbalizes understanding of instructions and care plan provided today and agrees to view in [place]. Active MyChart status and patient understanding of how to access instructions and care plan via MyChart confirmed with patient.     The patient has been provided with contact information for the care management team and has been advised to call with any health related questions or concerns.   Aragon Scarantino [name], RN, BSN, [place] Coordinator Office number [phone]

## 2022-11-29 NOTE — Patient Outreach (Signed)
  Care Coordination   Follow Up Visit Note   01/15/2023 Name: [name] MRN: [id number] DOB: Sep 10, 1966  [name] is a 57 y.o. year old male who sees [name], [place] for primary care. I spoke with  [name] by phone today.  What matters to the patients health and wellness today?  Updated patient on attempts to reach [name] on 11/28/22 unsuccessful multiple times with a voice message left to include his [name] needs   Goals Addressed   None     SDOH assessments and interventions completed:  No     Care Coordination Interventions:  Yes, provided   Follow up plan: Follow up call scheduled for 12/01/22    Encounter Outcome:  Pt. Visit Completed   [name] L. [name] Penner, RN, BSN, CCM [facility] Care Management Community Coordinator Office number [phone]

## 2022-11-29 NOTE — Patient Outreach (Signed)
  Care Coordination   Follow Up Visit Note   11/29/2022 Name: Twyman Prusak MRN: [id number] DOB: [birth year]  Lino Greenstone is a 57 y.o. year old male who sees [name], NP for primary care. I spoke with  Bonham Librarian, academic by phone today.  What matters to the patients health and wellness today?  Confirmed patient has not received a call from [place] transportation today 11/29/22.   [place] confirms no calls received after RN Cm left 2 voice messages on 11/28/22 at [phone] and one on 11/10/22  Outreach to DSS [phone] with success to get Butch Penny to assist  Informed it is too early to schedule patient's 12/11/22, 12/15/22 & 01/24/23 transportation   Goals Addressed               This Visit's Progress     Patient Stated     Patient will obtain a stretcher transported visit to wound care center + other MD visit via medicaid transportation [facility]) (pt-stated)   On track     Care Coordination Interventions: Interventions Today    Flowsheet Row Most Recent Value  Chronic Disease   Chronic disease during today's visit Other  [transportation to medical appointments]  General Interventions   General Interventions Discussed/Reviewed [employer]  Doctor Visits Discussed/Reviewed --  [25 MINUTE outreach for DSS transportation wait + assist with ride entry by Butch Penny today 11/29/22]  PCP/Specialist Visits --  [too early to call in transportation for 12/11/22, 12/15/22, 01/24/23 medical visits]  Communication with --  Donell Beers call to patient after leaving a voice message for his 12/04/22 transportation to Dr Iona Hansen at [phone] after multiple unsuccessful calls with long wait times. 12/28/22 spoke with Butch Penny to confirm the booking to Dr Iona Hansen for 12/04/22]     Set up transportation through [place] [phone]          SDOH assessments and interventions completed:  No     Care Coordination Interventions:  Yes, provided   Follow up plan: Follow up call  scheduled for 12/08/22    Encounter Outcome:  Pt. Visit Completed   Mykell Genao [name], RN, BSN, [place] Coordinator Office number [phone]

## 2022-12-01 ENCOUNTER — Ambulatory Visit: Payer: Self-pay | Admitting: *Deleted

## 2022-12-01 NOTE — Patient Instructions (Signed)
Visit Information  Thank you for taking time to visit with me today. Please don't hesitate to contact me if I can be of assistance to you.   Following are the goals we discussed today:   Goals Addressed               This Visit's Progress     Patient Stated     Patient will obtain a stretcher transported visit to wound care center + other MD visit via medicaid transportation [facility]) (pt-stated)   On track     Care Coordination Interventions: Interventions Today    Flowsheet Row Most Recent Value  Chronic Disease   Chronic disease during today's visit Other  [urology follow up appointment]  General Interventions   General Interventions Discussed/Reviewed Communication with, Doctor Visits  Doctor Visits Discussed/Reviewed Doctor Visits Reviewed, Specialist  [confirmed with [place] that his transport for 12/04/22 was set up with Butch Penny with the local DSS transport services]     Set up transportation through [place]          [place] next appointment is by telephone on 12/08/22 at 0930  Please call the care guide team at [phone] if you need to cancel or reschedule your appointment.   If you are experiencing a Mental Health or [place] or need someone to talk to, please call the Suicide and Crisis Lifeline: 988 call the [place] National Suicide Prevention Lifeline: 515-654-3464 or TTY: 626-070-7758 TTY 724-448-5992) to talk to a trained counselor call 1-800-273-TALK (toll free, 24 hour hotline) go to [facility] [address], [place] [phone]) call 911   Patient verbalizes understanding of instructions and care plan provided today and agrees to view in [place]. Active MyChart status and patient understanding of how to access instructions and care plan via MyChart confirmed with patient.     The patient has been provided with contact information for the care management team and has been advised to  call with any health related questions or concerns.   [place]olia Mattila [name], RN, BSN, [place] Coordinator Office number [phone]

## 2022-12-01 NOTE — Patient Outreach (Signed)
  Care Coordination   Follow Up Visit Note   12/01/2022 Name: [name] MRN: [id number] DOB: 01-05-1966  [name] is a 58 y.o. year old male who sees [name], NP for primary care. I spoke with  [name], academic by phone today.  What matters to the patients health and wellness today?  Confirmed transport for 12/04/22, updated by patient that the 12/15/22  one changed to 12/19/22    Goals Addressed               This Visit's Progress     Patient Stated     Patient will obtain a stretcher transported visit to wound care center + other MD visit via medicaid transportation [facility]) (pt-stated)   On track     Care Coordination Interventions: Interventions Today    Flowsheet Row Most Recent Value  Chronic Disease   Chronic disease during today's visit Other  [urology follow up appointment]  General Interventions   General Interventions Discussed/Reviewed Communication with, Doctor Visits  Doctor Visits Discussed/Reviewed Doctor Visits Reviewed, Specialist  [confirmed with [name] that his transport for 12/04/22 was set up with Butch Penny with the local DSS transport services]     Set up transportation through [place] [phone]          SDOH assessments and interventions completed:  No     Care Coordination Interventions:  Yes, provided   Follow up plan: Follow up call scheduled for 12/08/22    Encounter Outcome:  Pt. Visit Completed   [name] [name], RN, BSN, [place] Coordinator Office number [phone]

## 2022-12-08 ENCOUNTER — Ambulatory Visit: Payer: Self-pay | Admitting: *Deleted

## 2022-12-08 NOTE — Patient Outreach (Signed)
  Care Coordination   Care coordination/transportation   Visit Note   12/08/2022 Name: [name] MRN: [id number] DOB: 1966/07/24  [name] is a 57 y.o. year old male who sees [name], NP for primary care. I  spoke with staff at [facility]  What matters to the patients health and wellness today?  Schedule wound care 12/11/22 transport/PTAR    Goals Addressed               This Visit's Progress     Patient Stated     Patient will obtain a stretcher transported visit to wound care center + other MD visit via medicaid transportation [facility]) (pt-stated)   On track     Care Coordination Interventions: Interventions Today    Flowsheet Row Most Recent Value  Chronic Disease   Chronic disease during today's visit Other  [wound care /transportation]  General Interventions   General Interventions Discussed/Reviewed Communication with  Communication with --  [Spoke with staff at [place] [phone]]              SDOH assessments and interventions completed:  No     Care Coordination Interventions:  Yes, provided   Follow up plan: Follow up call scheduled for 01/15/23    Encounter Outcome:  Pt. Visit Completed   Latarsha Zani [name], RN, BSN, [place] Coordinator Office number [phone]

## 2022-12-08 NOTE — Patient Instructions (Signed)
Visit Information  Thank you for taking time to visit with me today. Please don't hesitate to contact me if I can be of assistance to you.   Following are the goals we discussed today:   Goals Addressed               This Visit's Progress     Patient Stated     Patient will obtain a stretcher transported visit to wound care center + other MD visit via medicaid transportation [facility]) (pt-stated)   On track     Care Coordination Interventions: Interventions Today    Flowsheet Row Most Recent Value  Chronic Disease   Chronic disease during today's visit Other  [wound care /transportation]  General Interventions   General Interventions Discussed/Reviewed Communication with  Communication with --  [Spoke with staff at [facility] [phone]]              [place] next appointment is by telephone on 01/15/23 at 1115  Please call the care guide team at [phone] if you need to cancel or reschedule your appointment.   If you are experiencing a Mental Health or [place] or need someone to talk to, please call the Suicide and Crisis Lifeline: 988 call the [place] National Suicide Prevention Lifeline: (956)525-3208 or TTY: 731-147-5543 TTY 289-533-9821) to talk to a trained counselor call 1-800-273-TALK (toll free, 24 hour hotline) go to [facility] Care [address] [place] [phone]) call 911   Patient verbalizes understanding of instructions and care plan provided today and agrees to view in [place]. Active MyChart status and patient understanding of how to access instructions and care plan via MyChart confirmed with patient.     The patient has been provided with contact information for the care management team and has been advised to call with any health related questions or concerns.   Sani Loiseau [name], RN, BSN, [place] Coordinator Office number [phone]

## 2023-01-15 ENCOUNTER — Ambulatory Visit: Payer: Self-pay | Admitting: *Deleted

## 2023-01-15 NOTE — Patient Instructions (Signed)
Visit Information  Thank you for taking time to visit with me today. Please don't hesitate to contact me if I can be of assistance to you.   Following are the goals we discussed today:   Goals Addressed               This Visit's Progress     Patient Stated     COMPLETED: obtain CPAP & supplies  ([facility]) (pt-stated)   On track     Pending video visit outreach for sleep study Has supplies Completed goal      Patient will obtain a stretcher transported visit to wound care center + other MD visit via medicaid transportation [facility]) (pt-stated)   On track     Care Coordination Interventions: Interventions Today    Flowsheet Row Most Recent Value  Chronic Disease   Chronic disease during today's visit Other  [wound care , follow up appointments]  General Interventions   General Interventions Discussed/Reviewed [employer], Doctor Visits  Doctor Visits Discussed/Reviewed Doctor Visits Reviewed, PCP, Specialist  [updated new pcp in EPIC Haim Batts]  PCP/Specialist Visits Compliance with follow-up visit  [encouraged]  Education Interventions   Education Provided Provided Education  Provided Verbal Education On Other  [video visits discussed, he will be contacted by the office staff for guidance]  Mental Health Interventions   Mental Health Discussed/Reviewed Mental Health Reviewed, Coping Strategies  [reports he is doing well. He denies any medical or social concerns today]              Our next appointment is by telephone on 01/29/23 at 1115  Please call the care guide team at [phone] if you need to cancel or reschedule your appointment.   If you are experiencing a Mental Health or Behavioral Health Crisis or need someone to talk to, please call the Suicide and Crisis Lifeline: 988 call the [place] National Suicide Prevention Lifeline: 281-844-4128 or TTY: 5817791625 TTY (930)531-2171) to talk to a trained counselor call 1-800-273-TALK (toll free, 24 hour  hotline) go to [facility] [address] [place] [phone]) call 911   Patient verbalizes understanding of instructions and care plan provided today and agrees to view in MyChart. Active MyChart status and patient understanding of how to access instructions and care plan via MyChart confirmed with patient.     The patient has been provided with contact information for the care management team and has been advised to call with any health related questions or concerns.   Draycen Leichter L. Noelle Penner, RN, BSN, CCM [facility] Care Management Community Coordinator Office number [phone]

## 2023-01-15 NOTE — Patient Outreach (Signed)
  Care Coordination   Follow Up Visit Note   01/15/2023 Name: [name] MRN: [id number] DOB: 09/18/1966  [name] is a 57 y.o. year old male who sees [name], [place] for primary care. I spoke with  [name] by phone today.  What matters to the patients health and wellness today?  [name] reports he is doing well. He denies any medical or social concerns today He confirms a new pcp.  He reviewed his appointments with RN CM He mentioned a video sleep study visit for today but not listed on [facility] EPIC He voiced understanding that if it is a video visit, he will be contacted by the office staff for guidance He voiced appreciation for the follow up outreach     Goals Addressed               This Visit's Progress     Patient Stated     COMPLETED: obtain CPAP & supplies  ([facility]) (pt-stated)   On track     Pending video visit outreach for sleep study Has supplies Completed goal      Patient will obtain a stretcher transported visit to wound care [name] + other MD visit via medicaid transportation [name]) (pt-stated)   On track     Care Coordination Interventions: Interventions Today    Flowsheet Row Most Recent Value  Chronic Disease   Chronic disease during today's visit Other  [wound care , follow up appointments]  General Interventions   General Interventions Discussed/Reviewed [employer], Doctor Visits  Doctor Visits Discussed/Reviewed Doctor Visits Reviewed, PCP, Specialist  [updated new pcp in EPIC [name]]  PCP/Specialist Visits Compliance with follow-up visit  [encouraged]  Education Interventions   Education Provided Provided Education  Provided Verbal Education On Other  [video visits discussed, he will be contacted by the office staff for guidance]  Mental Health Interventions   Mental Health Discussed/Reviewed Mental Health Reviewed, Coping Strategies  [reports he is doing well. He denies any medical or social concerns today]               SDOH assessments and interventions completed:  No     Care Coordination Interventions:  Yes, provided   Follow up plan: Follow up call scheduled for 01/29/23    Encounter Outcome:  Pt. Visit Completed   Glynn Freas L. Noelle Penner, RN, BSN, CCM [name] Care Management Community Coordinator Office number [phone]

## 2023-01-15 NOTE — Patient Outreach (Signed)
  Care Coordination   Follow Up Visit Note   01/15/2023 late entry for 08/22/22 Name: [name] MRN: [id number] DOB: 04-07-1966  [name] is a 57 y.o. year old male who sees [name], [place] for primary care. I spoke with  [name] by phone today.  What matters to the patients health and wellness today?  Follow up on transportation services Outreach to patient, urology office to reschedule urology visit, DSS staff to scheduled the transportation to urology   Goals Addressed   None     SDOH assessments and interventions completed:  No     Care Coordination Interventions:  Yes, provided   Follow up plan: Follow up call scheduled for 08/29/22    Encounter Outcome:  Pt. Visit Completed   [name] Gilkes L. Noelle Penner, RN, BSN, CCM [facility] Care Management Community Coordinator Office number [phone]

## 2023-01-25 ENCOUNTER — Emergency Department (HOSPITAL_COMMUNITY)
Admission: EM | Admit: 2023-01-25 | Discharge: 2023-01-25 | Disposition: A | Discharge status: Home / Self Care | Payer: 59 | Source: Home / Self Care | Attending: Emergency Medicine | Admitting: Emergency Medicine

## 2023-01-25 ENCOUNTER — Other Ambulatory Visit: Payer: Self-pay

## 2023-01-25 ENCOUNTER — Emergency Department (HOSPITAL_COMMUNITY): Payer: 59

## 2023-01-25 DIAGNOSIS — Z7982 Long term (current) use of aspirin: Secondary | ICD-10-CM | POA: Insufficient documentation

## 2023-01-25 DIAGNOSIS — Z79899 Other long term (current) drug therapy: Secondary | ICD-10-CM | POA: Insufficient documentation

## 2023-01-25 DIAGNOSIS — U071 COVID-19: Secondary | ICD-10-CM | POA: Insufficient documentation

## 2023-01-25 DIAGNOSIS — I10 Essential (primary) hypertension: Secondary | ICD-10-CM | POA: Insufficient documentation

## 2023-01-25 DIAGNOSIS — E875 Hyperkalemia: Secondary | ICD-10-CM | POA: Insufficient documentation

## 2023-01-25 DIAGNOSIS — R0602 Shortness of breath: Secondary | ICD-10-CM | POA: Diagnosis not present

## 2023-01-25 LAB — CBC WITH DIFFERENTIAL/PLATELET
Abs Immature Granulocytes: 0.05 10*3/uL (ref 0.00–0.07)
Basophils Absolute: 0 10*3/uL (ref 0.0–0.1)
Basophils Relative: 0 %
Eosinophils Absolute: 0 10*3/uL (ref 0.0–0.5)
Eosinophils Relative: 0 %
HCT: 33.8 % — ABNORMAL LOW (ref 39.0–52.0)
Hemoglobin: 10.2 g/dL — ABNORMAL LOW (ref 13.0–17.0)
Immature Granulocytes: 1 %
Lymphocytes Relative: 8 %
Lymphs Abs: 0.7 10*3/uL (ref 0.7–4.0)
MCH: 28.1 pg (ref 26.0–34.0)
MCHC: 30.2 g/dL (ref 30.0–36.0)
MCV: 93.1 fL (ref 80.0–100.0)
Monocytes Absolute: 0.6 10*3/uL (ref 0.1–1.0)
Monocytes Relative: 7 %
Neutro Abs: 8.1 10*3/uL — ABNORMAL HIGH (ref 1.7–7.7)
Neutrophils Relative %: 84 %
Platelets: 252 10*3/uL (ref 150–400)
RBC: 3.63 MIL/uL — ABNORMAL LOW (ref 4.22–5.81)
RDW: 15.6 % — ABNORMAL HIGH (ref 11.5–15.5)
WBC: 9.6 10*3/uL (ref 4.0–10.5)
nRBC: 0 % (ref 0.0–0.2)

## 2023-01-25 LAB — D-DIMER, QUANTITATIVE: D-Dimer, Quant: 1.01 ug/mL-FEU — ABNORMAL HIGH (ref 0.00–0.50)

## 2023-01-25 LAB — BASIC METABOLIC PANEL
Anion gap: 10 (ref 5–15)
BUN: 30 mg/dL — ABNORMAL HIGH (ref 6–20)
CO2: 25 mmol/L (ref 22–32)
Calcium: 9 mg/dL (ref 8.9–10.3)
Chloride: 100 mmol/L (ref 98–111)
Creatinine, Ser: 1.56 mg/dL — ABNORMAL HIGH (ref 0.61–1.24)
GFR, Estimated: 51 mL/min — ABNORMAL LOW (ref >60)
Glucose, Bld: 205 mg/dL — ABNORMAL HIGH (ref 70–99)
Potassium: 6.1 mmol/L — ABNORMAL HIGH (ref 3.5–5.1)
Sodium: 135 mmol/L (ref 135–145)

## 2023-01-25 LAB — TROPONIN I (HIGH SENSITIVITY)
Troponin I (High Sensitivity): 18 ng/L — ABNORMAL HIGH (ref <18)
Troponin I (High Sensitivity): 23 ng/L — ABNORMAL HIGH (ref <18)

## 2023-01-25 LAB — BRAIN NATRIURETIC PEPTIDE: B Natriuretic Peptide: 35.3 pg/mL (ref 0.0–100.0)

## 2023-01-25 LAB — SARS CORONAVIRUS 2 BY RT PCR: SARS Coronavirus 2 by RT PCR: POSITIVE — AB

## 2023-01-25 MED ORDER — SODIUM ZIRCONIUM CYCLOSILICATE 10 G PO PACK
10.0000 g | PACK | Freq: Once | ORAL | Status: AC
  Administered 2023-01-25: 10 g via ORAL
  Filled 2023-01-25: qty 1

## 2023-01-25 MED ORDER — IOHEXOL 350 MG/ML SOLN
75.0000 mL | Freq: Once | INTRAVENOUS | Status: AC | PRN
  Administered 2023-01-25: 75 mL via INTRAVENOUS

## 2023-01-25 MED ORDER — PAXLOVID (300/100) 20 X 150 MG & 10 X 100MG PO TBPK: 3.0000 | ORAL_TABLET | Freq: Two times a day (BID) | ORAL | 0 refills | Status: DC

## 2023-01-25 NOTE — ED Triage Notes (Signed)
Pt arrives to ED c/o SOB. Pt found to have O2 of 85% while having CPAP on face. Family reports that mask was on but machine was not. Pt placed on 4L by EMS. Pt w/ recent dx of covid. Pr reports productive cough. 121/69, 102HR, 100 on 4L [id number]

## 2023-01-25 NOTE — ED Notes (Addendum)
Currently off CPAP, using intermittently, currently talking on phone, calling for his family to bring his CPAP for [employer] to assess his machine with him in ED prior to d/c. Pending arrival of his CPAP from home.

## 2023-01-25 NOTE — ED Notes (Signed)
Pt alert, NAD, calm, interactive, resting comfortably on CPAP. Denies current sob, new pain, or nausea. 2nd trop sent.

## 2023-01-25 NOTE — Progress Notes (Signed)
Transition of Care [facility]) - Emergency Department Mini Assessment   Patient Details  Name: [name] MRN: [id number] Date of Birth: 1966/07/12  [facility] CM/SW Contact:    Oletta Cohn, RN Phone Number: 01/25/2023, 11:51 AM   Clinical Narrative: RNCM met with pt at bedside regarding him contacting DME company to repair/replace DME oxygen or CPAP.  Pt states he has called them numerous times without resolve.    RNCM contacted [employer] Rep Barbara Cower) to discuss this case.  Barbara Cower suggested pt going home HE Barbara Cower) would call [employer] RT for repair.  Pt not satisfied with that suggestion and wanted to revisit them coming to hospital to repair/replace.  RNCM reviewed case with EDP who is willing to have DME repaired/replaced prior to discharge.   Barbara Cower, with [employer] will contact RT and pt has contacted caregiver to bring DME to hospital for troubleshooting.   ED Mini Assessment: What brought you to the Emergency Department? : (P) My caregiver had a hard time arousing me from sleep.  I think that either my oxygen machine is broken or CPAP  Barriers to Discharge: (P) ED DME delivery  Barrier interventions: (P) Technician to ED to teoubleshoot DME  Means of departure: (P) Ambulance  Interventions which prevented an admission or readmission: DME Provided    Patient Contact and Communications        ,                 Admission diagnosis:  Low o2 Patient Active Problem List   Diagnosis Date Noted   Colostomy complication    Complicated UTI (urinary tract infection) 01/15/2022   Gout 12/30/2021   Morbid obesity with BMI of 40.0-44.9, adult 12/22/2021   Intertriginous skin ulcer 12/22/2021   Acute kidney injury 12/20/2021   Hypospadias 12/09/2021   Small bowel obstruction 10/13/2021   Colon cancer screening 05/20/2021   Bloating 04/11/2021   Chronic indwelling Foley catheter 04/01/2021   Orthopnea 04/01/2021   Obstructive sleep apnea 02/24/2021   Anemia of  chronic disease 02/24/2021   Polysubstance abuse 07/09/2019   Substance induced mood disorder 08/21/2018   GERD without esophagitis 08/14/2018   Benign essential HTN 08/14/2018   Opioid overdose 08/14/2018   Drug-induced constipation 02/14/2017   UTI (urinary tract infection)    Colostomy in place for fecal diversion 06/01/2016   Protein-calorie malnutrition, severe 05/31/2016   Chronic pain 05/26/2016   Hypertension 05/26/2016   Pressure injury of sacral region, stage 4 05/19/2015   Paraplegia    PCP:  [name], PA-C Pharmacy:   [facility] [address], [place] - [address] [place] [address] Phone: [phone] Fax: [phone]  [facility] - [place], [place] - [address] [place] [address] Phone: [phone] Fax: [phone]

## 2023-01-25 NOTE — ED Provider Notes (Addendum)
Care of patient handed off to me by Riki Sheer, PA-C at change of shift.  Please see his note for initiation of care.  Briefly, 57 year old male who presents to the emergency department with shortness of breath.  Was found to be hypoxic by his home health nurse at 85%.  Apparently had his CPAP mask on but the machine was turned off.  Initially felt short of breath and was placed on 4 L nasal cannula by EMS with improvement in his symptoms.  He also presented mildly tachycardic without chest pain or palpitations.   Physical Exam  BP 126/60   Pulse 90   Temp 99.1 F (37.3 C)   Resp 14   Ht  (1.854 m)   SpO2 92%   BMI 39.58 kg/m   Physical Exam Vitals and nursing note reviewed.  HENT:     Head: Normocephalic and atraumatic.  Eyes:     General: No scleral icterus. Pulmonary:     Effort: Pulmonary effort is normal. No respiratory distress.  Skin:    Findings: No rash.  Neurological:     General: No focal deficit present.     Mental Status: He is alert.  Psychiatric:        Mood and Affect: Mood normal.        Behavior: Behavior normal.        Thought Content: Thought content normal.        Judgment: Judgment normal.     Procedures  Procedures  ED Course / MDM   Clinical Course as of 01/25/23 0908  Thu Jan 25, 2023  0115 When I entered room, patient was satting 100% on 4 L.  I remove nasal cannula and patient continued to maintain sats of 100% without tachypnea.  Also denied shortness of breath and chest pain at that time. [JR]  0507 Creatinine(!): 1.56 [JR]    Clinical Course User Index [JR] Gareth Eagle, PA-C   Medical Decision Making Amount and/or Complexity of Data Reviewed Labs: ordered. Decision-making details documented in ED Course. Radiology: ordered.  Risk Prescription drug management.   Initially was placed on 4 L nasal cannula which was eventually removed with maintenance of O2 sat of 99 to 100% on room air.  He had tachycardia and a positive  D-dimer.  They obtained a CTA PE study which was negative for any acute findings. BNP is negative BMP with hyperkalemia to 6.1.  No EKG changes or chest pain.  He was given a dose of Lokelma.  Additionally found to have mildly increased BUN and creatinine up to 1.56. COVID-positive Initial troponin is 18.  Plan is to obtain a delta troponin, if flat are negative can discharge with Paxlovid.  2nd troponin 23 from 18. Patient is asymptomatic. He does have COVID. I have reassessed him and he is oxygenating well on room air. Feels well to go home. Will dispo with Paxlovid, PCP f/u in 3 days for renal function, K recheck.   Addendum: 1215: patient does not feel okay to go home without CPAP checked to make sure it is working. I have involved social work. They have called his CPAP company and they are coordinating family/home nurse to bring CPAP machine to ER so that it can be evaluated by company.  2nd Addendium 1502: Respiratory able to fix patient CPAP per Jacqualine Mau, CSW. PTAR being called for discharge home.    Cristopher Peru, PA-C 01/25/23 0909    Cristopher Peru, PA-C 01/25/23 [phone]  Cristopher Peru, PA-C 01/25/23 1502    Pricilla Loveless, MD 01/25/23 [phone]

## 2023-01-25 NOTE — ED Notes (Signed)
Report received assumed care at this time pt resting on stretcher pillows provided for comfort will continue to monitor

## 2023-01-25 NOTE — ED Notes (Signed)
TOC/SW speaking with pt, at [facility]. Pt currently on CPAP.

## 2023-01-25 NOTE — ED Notes (Signed)
Delay in DC, EDPA back at bedside. Transportation and CPAP issues.

## 2023-01-25 NOTE — ED Provider Notes (Cosign Needed Addendum)
[place] EMERGENCY DEPARTMENT AT [facility] Provider Note   CSN: [id number] Arrival date & time: 01/25/23  7829     History  Chief Complaint  Patient presents with   Shortness of Breath   HPI [name] is a 57 y.o. male with paraplegic, hypertension, history of cocaine use, colon resection in 2017 with colostomy, and sleep apnea presenting for shortness of breath.  Patient was asleep at home with CPAP when he was awoken from sleep by his home nurse aide who reported that his O2 saturations were 85% while he was sleeping.  Apparently he was wearing the CPAP machine properly but the CPAP machine was not on.  Patient stated that when he woke he was initially short of breath but at this time he is no longer short of breath.  He was placed on 4 L of oxygen by nasal cannula by EMS.  O2 sats and route were high 90s to 100%.  Denies chest pain, cough, congestion, and fever.   Shortness of Breath      Home Medications Prior to Admission medications   Medication Sig Start Date End Date Taking? Authorizing Provider  nirmatrelvir & ritonavir (PAXLOVID, 300/100,) 20 x 150 MG & 10 x  TBPK Take 3 tablets by mouth 2 (two) times daily for 5 days. 01/25/23 01/30/23 Yes Gareth Eagle, PA-C  acetaminophen (TYLENOL) 500 MG tablet Take 2 tablets (1,000 mg total) by mouth 3 (three) times daily as needed for mild pain or moderate pain (or Fever >/= 101). 02/09/22   Marolyn Haller, MD  allopurinol (ZYLOPRIM) 100 MG tablet Take 1 tablet (100 mg total) by mouth daily. 01/09/22 10/06/22  Evlyn Kanner, MD  allopurinol (ZYLOPRIM) 100 MG tablet Take by mouth.    [provider]  ascorbic acid (VITAMIN C) 1000 MG tablet Take by mouth.    [provider]  ascorbic acid (VITAMIN C) 500 MG tablet Take 1 tablet (500 mg total) by mouth 2 (two) times daily. 02/09/22   Marolyn Haller, MD  aspirin EC 81 MG tablet Take 81 mg by mouth every morning. Swallow whole.    [provider]  benzocaine (ANBESOL) 10 % mucosal gel Use as directed 1 application. in the mouth or throat daily as needed for mouth pain.    [provider]  benzocaine (ANBESOL) 10 % mucosal gel Apply to the mouth or throat two (2) times a day as needed.    [provider]  Blood Pressure Monitor DEVI Please provide patient with insurance approved blood pressure monitor. ICD10 I.10 12/27/20   Claiborne Rigg, NP  Calcium Carbonate-Vitamin D (CALCIUM-D PO) Take 1 tablet by mouth every morning.    [provider]  Chlorhexidine Gluconate Cloth 2 % PADS Apply 6 each topically daily. 02/09/22   Marolyn Haller, MD  clotrimazole (LOTRIMIN) 1 % cream Apply topically 2 (two) times daily. Patient taking differently: Apply 1 application  topically 2 (two) times daily. 01/09/22   Elige Radon, MD  clotrimazole (LOTRIMIN) 1 % cream Apply topically. 01/09/22   [provider]  diclofenac Sodium (VOLTAREN) 1 % GEL Apply 2 g topically 3 (three) times daily.    [provider]  diclofenac Sodium (VOLTAREN) 1 % GEL Apply topically.    [provider]  famotidine (PEPCID) 40 MG tablet Take 1 tablet (40 mg total) by mouth daily. 10/05/22   Grayce Sessions, NP  feeding supplement (ENSURE ENLIVE / ENSURE PLUS) LIQD Take 237 mLs by  mouth 2 (two) times daily between meals. 02/09/22   Marolyn Haller, MD  ferrous sulfate 325 (65 FE) MG tablet Take 1 tablet (325 mg total) by mouth daily with breakfast. 02/09/22   Marolyn Haller, MD  ferrous sulfate 325 (65 FE) MG tablet Take by mouth. 02/09/22   [provider]  ibuprofen (ADVIL) 400 MG tablet Take 1 tablet (400 mg total) by mouth every 8 (eight) hours as needed. 02/09/22   Marolyn Haller, MD  ibuprofen (ADVIL) 400 MG tablet Take by mouth. 02/09/22   [provider]  liver oil-zinc oxide (DESITIN) 40 % ointment Apply 1 application topically daily.    [provider]  liver oil-zinc  oxide (DESITIN) 40 % ointment Apply topically.    [provider]  lubiprostone (AMITIZA) 24 MCG capsule TAKE 1 CAPSULE (24 MCG TOTAL) BY MOUTH 2 (TWO) TIMES DAILY WITH A MEAL. 02/13/22   Zehr, Princella Pellegrini, PA-C  magnesium hydroxide (MILK OF MAGNESIA) 400 MG/5ML suspension Take 30 mLs by mouth daily. 02/09/22   Marolyn Haller, MD  magnesium hydroxide (MILK OF MAGNESIA) 400 MG/5ML suspension Take by mouth. 02/09/22   [provider]  methadone (DOLOPHINE) 1 MG/1ML solution Take by mouth.    [provider]  methadone (DOLOPHINE) 10 MG/5ML solution Take 50 mg by mouth daily.    [provider]  Multiple Vitamin (MULTIVITAMIN WITH MINERALS) TABS tablet Take 1 tablet by mouth daily. 02/09/22   Marolyn Haller, MD  nutrition supplement, JUVEN, (JUVEN) PACK Take 1 packet by mouth 2 (two) times daily between meals. 02/09/22   Marolyn Haller, MD  Olmesartan-amLODIPine-HCTZ 40-10-25 MG TABS Take 1 tablet by mouth daily. 04/15/22   [provider]  Omega-3 1000 MG CAPS Take by mouth.    [provider]  OVER THE COUNTER MEDICATION Take 3 capsules by mouth every morning. Omega XL    [provider]  oxybutynin (DITROPAN) 5 MG tablet Take 5 mg by mouth 3 (three) times daily. 07/04/22   [provider]  oxyCODONE (OXY IR/ROXICODONE) 5 MG immediate release tablet Take 5 mg by mouth every 4 (four) hours as needed. 07/04/22   [provider]  oxyCODONE (ROXICODONE) 15 MG immediate release tablet Take by mouth.    [provider]  Oxymetazoline HCl (NASAL SPRAY NA) Place 1 spray into both nostrils daily as needed (congestion). Family care nasal spray    [provider]  polyethylene glycol (MIRALAX / GLYCOLAX) 17 g packet Take 17 g by mouth 2 (two) times daily. 02/09/22   Marolyn Haller, MD  polyethylene glycol [facility] / Ethelene Hal) 17 g packet Take by mouth. 02/09/22   [provider]  predniSONE (DELTASONE) 20  MG tablet Take 40 mg by mouth every morning. 11/08/21   [provider]  ranitidine (ZANTAC) 300 MG tablet Take by mouth.    [provider]  saccharomyces boulardii (FLORASTOR) 250 MG capsule Take 250 mg by mouth daily.    [provider]  senna-docusate (SENOKOT-S) 8.6-50 MG tablet Take 2 tablets by mouth 2 (two) times daily. 02/09/22   Marolyn Haller, MD      Allergies    Pork-derived products and Other    Review of Systems   Review of Systems  Respiratory:  Positive for shortness of breath.     Physical Exam   Vitals:   01/25/23 0500 01/25/23 0530  BP: (!) 128/57 (!) 110/95  Pulse: 88 89  Resp: (!) 21 14  Temp:  SpO2: 96% 100%    CONSTITUTIONAL:  well-appearing, NAD NEURO:  Alert and oriented x 3, CN 3-12 grossly intact EYES:  eyes equal and reactive ENT/NECK:  Supple, no stridor  CARDIO:  tachycardic and regular rhythm, appears well-perfused  PULM:  No respiratory distress, CTAB on 4L [place] GI/GU:  non-distended, protuberant, soft, nontender, colostomy bag noted in left lower quadrant MSK/SPINE:  No gross deformities, no edema, moves all extremities  SKIN:  no rash, atraumatic   *Additional and/or pertinent findings included in MDM below    ED Results / Procedures / Treatments   Labs (all labs ordered are listed, but only abnormal results are displayed) Labs Reviewed  SARS CORONAVIRUS 2 BY RT PCR - Abnormal; Notable for the following components:      Result Value   SARS Coronavirus 2 by RT PCR POSITIVE (*)    All other components within normal limits  BASIC METABOLIC PANEL - Abnormal; Notable for the following components:   Potassium 6.1 (*)    Glucose, Bld 205 (*)    BUN 30 (*)    Creatinine, Ser 1.56 (*)    GFR, Estimated 51 (*)    All other components within normal limits  CBC WITH DIFFERENTIAL/PLATELET - Abnormal; Notable for the following components:   RBC 3.63 (*)    Hemoglobin 10.2 (*)    HCT 33.8 (*)    RDW 15.6 (*)     Neutro Abs 8.1 (*)    All other components within normal limits  D-DIMER, QUANTITATIVE - Abnormal; Notable for the following components:   D-Dimer, Quant 1.01 (*)    All other components within normal limits  TROPONIN I (HIGH SENSITIVITY) - Abnormal; Notable for the following components:   Troponin I (High Sensitivity) 18 (*)    All other components within normal limits  BRAIN NATRIURETIC PEPTIDE  TROPONIN I (HIGH SENSITIVITY)    EKG EKG Interpretation  Date/Time:  Thursday January 25 2023 00:50:45 EDT Ventricular Rate:  102 PR Interval:  187 QRS Duration: 91 QT Interval:  349 QTC Calculation: 455 R Axis:   -70 Text Interpretation: Sinus tachycardia Left anterior fascicular block Low voltage, precordial leads Consider right ventricular hypertrophy Consider anterior infarct ST elevation, consider inferior injury When compared with ECG of 01/20/2022, HEART RATE has increased Confirmed by Dione Booze ([id number]) on 01/25/2023 12:57:07 AM  Radiology CT Angio Chest PE W/Cm &/Or Wo Cm  Result Date: 01/25/2023 CLINICAL DATA:  Pulmonary embolism suspected, low to intermediate probability. Positive D-dimer. Low O2 sats. EXAM: CT ANGIOGRAPHY CHEST WITH CONTRAST TECHNIQUE: Multidetector CT imaging of the chest was performed using the standard protocol during bolus administration of intravenous contrast. Multiplanar CT image reconstructions and MIPs were obtained to evaluate the vascular anatomy. RADIATION DOSE REDUCTION: This exam was performed according to the departmental dose-optimization program which includes automated exposure control, adjustment of the mA and/or kV according to patient size and/or use of iterative reconstruction technique. CONTRAST:  75mL OMNIPAQUE IOHEXOL 350 MG/ML SOLN COMPARISON:  None Available. FINDINGS: Cardiovascular: The heart is normal in size and there is no pericardial effusion. The aorta and pulmonary trunk are normal in caliber. No definite evidence of pulmonary  embolism. Mediastinum/Nodes: No mediastinal, hilar, or axillary lymphadenopathy. The thyroid gland, trachea, and esophagus are within normal limits. Lungs/Pleura: Mild atelectasis bilaterally. No effusion or pneumothorax. Upper Abdomen: No acute abnormality. Musculoskeletal: Degenerative changes are present in the thoracic spine. No acute osseous abnormality. Review of the MIP images confirms the above findings. IMPRESSION: No evidence  of pulmonary embolism or other acute process. Electronically Signed   By: Thornell Sartorius M.D.   On: 01/25/2023 04:43   DG Chest 2 View  Result Date: 01/25/2023 CLINICAL DATA:  Shortness of breath EXAM: CHEST - 2 VIEW COMPARISON:  01/15/2022 FINDINGS: The heart size and mediastinal contours are within normal limits. Both lungs are clear. The visualized skeletal structures are unremarkable. IMPRESSION: No active cardiopulmonary disease. Electronically Signed   By: Charlett Nose M.D.   On: 01/25/2023 01:53    Procedures Procedures    Medications Ordered in ED Medications  iohexol (OMNIPAQUE) 350 MG/ML injection 75 mL (75 mLs Intravenous Contrast Given 01/25/23 0428)  sodium zirconium cyclosilicate (LOKELMA) packet 10 g (10 g Oral Given 01/25/23 0528)    ED Course/ Medical Decision Making/ A&P Clinical Course as of 01/25/23 0653  Thu Jan 25, 2023  0115 When I entered room, patient was satting 100% on 4 L.  I remove nasal cannula and patient continued to maintain sats of 100% without tachypnea.  Also denied shortness of breath and chest pain at that time. [JR]  0507 Creatinine(!): 1.56 [JR]    Clinical Course User Index [JR] Gareth Eagle, PA-C                             Medical Decision Making Amount and/or Complexity of Data Reviewed Labs: ordered. Decision-making details documented in ED Course. Radiology: ordered.  Risk Prescription drug management.   Initial Impression and Ddx 57 year old well-appearing male presenting for shortness of breath.   Exam unremarkable no evidence of respiratory distress on 4 L nasal cannula.  Subsequently removed nasal cannula and patient remains stable on room air.  DDx includes pneumonia, COPD exacerbation, PE, ACS, CHF exacerbation. Patient PMH that increases complexity of ED encounter:  araplegic, hypertension, history of cocaine use, colon resection in 2017 with colostomy  Interpretation of Diagnostics I independent reviewed and interpreted the labs as followed: Hyperkalemia, elevated D-dimer, elevated troponin, COVID-positive, elevated BUN/creatinine  - I independently visualized the following imaging with scope of interpretation limited to determining acute life threatening conditions related to emergency care: CT angio chest, which revealed no acute pulmonary process  -I personally reviewed and interpreted EKG which revealed sinus tachycardia Patient Reassessment and Ultimate Disposition/Management Overall patient looks clinically well and has maintained O2 sats in the high 90s on room air and looks comfortable from respiratory standpoint.  Have low suspicion for ACS given patient does not endorse chest pain, EKG is nonischemic.  Will follow second troponin.  If no delta, appropriate to follow-up with his PCP.  Patient is COVID positive.  Sent Paxlovid to his pharmacy.  Suspect his transient hypoxia was related to the fact that his CPAP machine was off likely causing mechanical obstruction of his airway.  Labs also revealed patient was hypokalemic, treated with Lokelma, also BUN/creatinine above baseline since last checked a year ago.  Plan will be to have him follow-up with his PCP for lab recheck and reevaluation.  At this time second troponin is still pending.  Have signed out patient to PA Mertha Baars.    Patient management required discussion with the following services or consulting groups:  None  Complexity of Problems Addressed Acute complicated illness or Injury  Additional Data Reviewed and  Analyzed Further history obtained from: Past medical history and medications listed in the EMR, Prior ED visit notes, and Recent discharge summary  Patient Encounter Risk Assessment Prescriptions  Final Clinical Impression(s) / ED Diagnoses Final diagnoses:  COVID    Rx / DC Orders ED Discharge Orders          Ordered    nirmatrelvir & ritonavir (PAXLOVID, 300/100,) 20 x 150 MG & 10 x 100MG  TBPK  2 times daily        01/25/23 0518              Gareth Eagle, PA-C 01/25/23 0653    Gareth Eagle, PA-C 01/28/23 1431    Dione Booze, MD 01/29/23 [id number]

## 2023-01-25 NOTE — Discharge Planning (Signed)
Pt caregiver, Nonnie Done arrived with concentrator and home CPAP.  EDRT was able to troubleshoot DME and taught pt how to connect DME for home usage.  Pt and caregiver used teach back for proper use and pt was more comfortable with returning home.  RNCM reached out to [employer] to inform them to have RT visit pt in home to reinforce teaching.

## 2023-01-25 NOTE — ED Notes (Signed)
TOC at bedside discussing DC follow-ups with patient.

## 2023-01-25 NOTE — Discharge Instructions (Signed)
Evaluation today for shortness of breath related to have COVID.  I am starting on Paxlovid for treatment.  Otherwise your workup was reassuring.  Recommend you follow-up with your PCP in the next 3 to 4 days as your potassium and kidney labs were elevated.  If you chest pain, shortness of breath, trouble breathing, calf tenderness or any other concerning symptom please return emerged part for evaluation.

## 2023-01-25 NOTE — ED Notes (Signed)
Pt awaiting [facility] pick up.

## 2023-01-25 NOTE — ED Notes (Signed)
Patient placed onto 3 lpm via [place] due to dropping O2 sats while sleeping. RT contacted for CPAP as patient wears one at home to sleep.

## 2023-01-27 ENCOUNTER — Inpatient Hospital Stay (HOSPITAL_COMMUNITY)
Admission: EM | Admit: 2023-01-27 | Discharge: 2023-02-06 | DRG: 177 | Disposition: A | Discharge status: Home / Self Care | Payer: 59 | Attending: Infectious Diseases | Admitting: Infectious Diseases

## 2023-01-27 ENCOUNTER — Emergency Department (HOSPITAL_COMMUNITY): Payer: 59

## 2023-01-27 ENCOUNTER — Other Ambulatory Visit: Payer: Self-pay

## 2023-01-27 DIAGNOSIS — G8929 Other chronic pain: Secondary | ICD-10-CM | POA: Diagnosis present

## 2023-01-27 DIAGNOSIS — N179 Acute kidney failure, unspecified: Secondary | ICD-10-CM | POA: Diagnosis present

## 2023-01-27 DIAGNOSIS — G4733 Obstructive sleep apnea (adult) (pediatric): Secondary | ICD-10-CM | POA: Diagnosis present

## 2023-01-27 DIAGNOSIS — T148XXS Other injury of unspecified body region, sequela: Secondary | ICD-10-CM

## 2023-01-27 DIAGNOSIS — I10 Essential (primary) hypertension: Secondary | ICD-10-CM | POA: Diagnosis present

## 2023-01-27 DIAGNOSIS — L89894 Pressure ulcer of other site, stage 4: Secondary | ICD-10-CM | POA: Diagnosis present

## 2023-01-27 DIAGNOSIS — M109 Gout, unspecified: Secondary | ICD-10-CM | POA: Diagnosis present

## 2023-01-27 DIAGNOSIS — Y249XXS Unspecified firearm discharge, undetermined intent, sequela: Secondary | ICD-10-CM

## 2023-01-27 DIAGNOSIS — W3400XS Accidental discharge from unspecified firearms or gun, sequela: Secondary | ICD-10-CM | POA: Diagnosis not present

## 2023-01-27 DIAGNOSIS — D696 Thrombocytopenia, unspecified: Secondary | ICD-10-CM

## 2023-01-27 DIAGNOSIS — U071 COVID-19: Secondary | ICD-10-CM | POA: Diagnosis present

## 2023-01-27 DIAGNOSIS — J9692 Respiratory failure, unspecified with hypercapnia: Secondary | ICD-10-CM | POA: Diagnosis not present

## 2023-01-27 DIAGNOSIS — Z9359 Other cystostomy status: Secondary | ICD-10-CM

## 2023-01-27 DIAGNOSIS — G8220 Paraplegia, unspecified: Secondary | ICD-10-CM | POA: Diagnosis present

## 2023-01-27 DIAGNOSIS — Z7982 Long term (current) use of aspirin: Secondary | ICD-10-CM

## 2023-01-27 DIAGNOSIS — E43 Unspecified severe protein-calorie malnutrition: Secondary | ICD-10-CM | POA: Diagnosis present

## 2023-01-27 DIAGNOSIS — L89150 Pressure ulcer of sacral region, unstageable: Secondary | ICD-10-CM | POA: Diagnosis not present

## 2023-01-27 DIAGNOSIS — L89314 Pressure ulcer of right buttock, stage 4: Secondary | ICD-10-CM | POA: Diagnosis present

## 2023-01-27 DIAGNOSIS — K219 Gastro-esophageal reflux disease without esophagitis: Secondary | ICD-10-CM | POA: Diagnosis present

## 2023-01-27 DIAGNOSIS — Z466 Encounter for fitting and adjustment of urinary device: Secondary | ICD-10-CM | POA: Diagnosis not present

## 2023-01-27 DIAGNOSIS — Z87891 Personal history of nicotine dependence: Secondary | ICD-10-CM | POA: Diagnosis not present

## 2023-01-27 DIAGNOSIS — L89324 Pressure ulcer of left buttock, stage 4: Secondary | ICD-10-CM | POA: Diagnosis present

## 2023-01-27 DIAGNOSIS — Z91018 Allergy to other foods: Secondary | ICD-10-CM

## 2023-01-27 DIAGNOSIS — L89154 Pressure ulcer of sacral region, stage 4: Secondary | ICD-10-CM | POA: Diagnosis present

## 2023-01-27 DIAGNOSIS — D6959 Other secondary thrombocytopenia: Secondary | ICD-10-CM | POA: Diagnosis present

## 2023-01-27 DIAGNOSIS — Z6841 Body Mass Index (BMI) 40.0 and over, adult: Secondary | ICD-10-CM

## 2023-01-27 DIAGNOSIS — R339 Retention of urine, unspecified: Secondary | ICD-10-CM | POA: Diagnosis present

## 2023-01-27 DIAGNOSIS — Z841 Family history of disorders of kidney and ureter: Secondary | ICD-10-CM | POA: Diagnosis not present

## 2023-01-27 DIAGNOSIS — J9601 Acute respiratory failure with hypoxia: Secondary | ICD-10-CM | POA: Diagnosis present

## 2023-01-27 DIAGNOSIS — I452 Bifascicular block: Secondary | ICD-10-CM | POA: Diagnosis present

## 2023-01-27 DIAGNOSIS — E875 Hyperkalemia: Secondary | ICD-10-CM | POA: Diagnosis present

## 2023-01-27 DIAGNOSIS — Z9049 Acquired absence of other specified parts of digestive tract: Secondary | ICD-10-CM

## 2023-01-27 DIAGNOSIS — J9602 Acute respiratory failure with hypercapnia: Secondary | ICD-10-CM | POA: Diagnosis present

## 2023-01-27 DIAGNOSIS — Z79891 Long term (current) use of opiate analgesic: Secondary | ICD-10-CM

## 2023-01-27 DIAGNOSIS — J9691 Respiratory failure, unspecified with hypoxia: Secondary | ICD-10-CM | POA: Diagnosis not present

## 2023-01-27 DIAGNOSIS — G894 Chronic pain syndrome: Secondary | ICD-10-CM | POA: Diagnosis present

## 2023-01-27 DIAGNOSIS — Z933 Colostomy status: Secondary | ICD-10-CM

## 2023-01-27 DIAGNOSIS — R0602 Shortness of breath: Secondary | ICD-10-CM | POA: Diagnosis present

## 2023-01-27 DIAGNOSIS — Z79899 Other long term (current) drug therapy: Secondary | ICD-10-CM

## 2023-01-27 DIAGNOSIS — R0609 Other forms of dyspnea: Secondary | ICD-10-CM | POA: Diagnosis not present

## 2023-01-27 DIAGNOSIS — S34101S Unspecified injury to L1 level of lumbar spinal cord, sequela: Secondary | ICD-10-CM | POA: Diagnosis not present

## 2023-01-27 LAB — COMPREHENSIVE METABOLIC PANEL
ALT: 29 U/L (ref 0–44)
AST: 42 U/L — ABNORMAL HIGH (ref 15–41)
Albumin: 3.4 g/dL — ABNORMAL LOW (ref 3.5–5.0)
Alkaline Phosphatase: 75 U/L (ref 38–126)
Anion gap: 9 (ref 5–15)
BUN: 40 mg/dL — ABNORMAL HIGH (ref 6–20)
CO2: 25 mmol/L (ref 22–32)
Calcium: 8.3 mg/dL — ABNORMAL LOW (ref 8.9–10.3)
Chloride: 100 mmol/L (ref 98–111)
Creatinine, Ser: 2.17 mg/dL — ABNORMAL HIGH (ref 0.61–1.24)
GFR, Estimated: 34 mL/min — ABNORMAL LOW (ref >60)
Glucose, Bld: 153 mg/dL — ABNORMAL HIGH (ref 70–99)
Potassium: 5.4 mmol/L — ABNORMAL HIGH (ref 3.5–5.1)
Sodium: 134 mmol/L — ABNORMAL LOW (ref 135–145)
Total Bilirubin: 0.4 mg/dL (ref 0.3–1.2)
Total Protein: 8.7 g/dL — ABNORMAL HIGH (ref 6.5–8.1)

## 2023-01-27 LAB — CBC WITH DIFFERENTIAL/PLATELET
Abs Immature Granulocytes: 0.02 10*3/uL (ref 0.00–0.07)
Basophils Absolute: 0 10*3/uL (ref 0.0–0.1)
Basophils Relative: 0 %
Eosinophils Absolute: 0 10*3/uL (ref 0.0–0.5)
Eosinophils Relative: 0 %
HCT: 32.6 % — ABNORMAL LOW (ref 39.0–52.0)
Hemoglobin: 9.8 g/dL — ABNORMAL LOW (ref 13.0–17.0)
Immature Granulocytes: 0 %
Lymphocytes Relative: 7 %
Lymphs Abs: 0.6 10*3/uL — ABNORMAL LOW (ref 0.7–4.0)
MCH: 27.9 pg (ref 26.0–34.0)
MCHC: 30.1 g/dL (ref 30.0–36.0)
MCV: 92.9 fL (ref 80.0–100.0)
Monocytes Absolute: 0.5 10*3/uL (ref 0.1–1.0)
Monocytes Relative: 6 %
Neutro Abs: 8.3 10*3/uL — ABNORMAL HIGH (ref 1.7–7.7)
Neutrophils Relative %: 87 %
Platelets: 223 10*3/uL (ref 150–400)
RBC: 3.51 MIL/uL — ABNORMAL LOW (ref 4.22–5.81)
RDW: 15.4 % (ref 11.5–15.5)
Smear Review: NORMAL
WBC: 9.5 10*3/uL (ref 4.0–10.5)
nRBC: 0 % (ref 0.0–0.2)

## 2023-01-27 LAB — BLOOD GAS, VENOUS
Acid-Base Excess: 0.3 mmol/L (ref 0.0–2.0)
Bicarbonate: 27.2 mmol/L (ref 20.0–28.0)
O2 Saturation: 78.6 %
Patient temperature: 37
pCO2, Ven: 54 mmHg (ref 44–60)
pH, Ven: 7.31 (ref 7.25–7.43)
pO2, Ven: 45 mmHg (ref 32–45)

## 2023-01-27 LAB — I-STAT VENOUS BLOOD GAS, ED
Acid-base deficit: 2 mmol/L (ref 0.0–2.0)
Acid-base deficit: 2 mmol/L (ref 0.0–2.0)
Bicarbonate: 26.5 mmol/L (ref 20.0–28.0)
Bicarbonate: 26.5 mmol/L (ref 20.0–28.0)
Calcium, Ion: 1.01 mmol/L — ABNORMAL LOW (ref 1.15–1.40)
Calcium, Ion: 1.11 mmol/L — ABNORMAL LOW (ref 1.15–1.40)
HCT: 34 % — ABNORMAL LOW (ref 39.0–52.0)
HCT: 36 % — ABNORMAL LOW (ref 39.0–52.0)
Hemoglobin: 11.6 g/dL — ABNORMAL LOW (ref 13.0–17.0)
Hemoglobin: 12.2 g/dL — ABNORMAL LOW (ref 13.0–17.0)
O2 Saturation: 98 %
O2 Saturation: 46 %
Potassium: >8.5 mmol/L (ref 3.5–5.1)
Potassium: 5.4 mmol/L — ABNORMAL HIGH (ref 3.5–5.1)
Sodium: 131 mmol/L — ABNORMAL LOW (ref 135–145)
Sodium: 137 mmol/L (ref 135–145)
TCO2: 28 mmol/L (ref 22–32)
TCO2: 28 mmol/L (ref 22–32)
pCO2, Ven: 63.1 mmHg — ABNORMAL HIGH (ref 44–60)
pCO2, Ven: 62.7 mmHg — ABNORMAL HIGH (ref 44–60)
pH, Ven: 7.23 — ABNORMAL LOW (ref 7.25–7.43)
pH, Ven: 7.234 — ABNORMAL LOW (ref 7.25–7.43)
pO2, Ven: 123 mmHg — ABNORMAL HIGH (ref 32–45)
pO2, Ven: 31 mmHg — CL (ref 32–45)

## 2023-01-27 LAB — I-STAT ARTERIAL BLOOD GAS, ED
Acid-base deficit: 2 mmol/L (ref 0.0–2.0)
Bicarbonate: 25.6 mmol/L (ref 20.0–28.0)
Calcium, Ion: 1.16 mmol/L (ref 1.15–1.40)
HCT: 33 % — ABNORMAL LOW (ref 39.0–52.0)
Hemoglobin: 11.2 g/dL — ABNORMAL LOW (ref 13.0–17.0)
O2 Saturation: 89 %
Patient temperature: 98.6
Potassium: 5.2 mmol/L — ABNORMAL HIGH (ref 3.5–5.1)
Sodium: 138 mmol/L (ref 135–145)
TCO2: 27 mmol/L (ref 22–32)
pCO2 arterial: 59.3 mmHg — ABNORMAL HIGH (ref 32–48)
pH, Arterial: 7.244 — ABNORMAL LOW (ref 7.35–7.45)
pO2, Arterial: 67 mmHg — ABNORMAL LOW (ref 83–108)

## 2023-01-27 LAB — MRSA NEXT GEN BY PCR, NASAL: MRSA by PCR Next Gen: DETECTED — AB

## 2023-01-27 LAB — TECHNOLOGIST SMEAR REVIEW: Plt Morphology: NORMAL

## 2023-01-27 LAB — LACTIC ACID, PLASMA
Lactic Acid, Venous: 1.6 mmol/L (ref 0.5–1.9)
Lactic Acid, Venous: 1.4 mmol/L (ref 0.5–1.9)

## 2023-01-27 LAB — TROPONIN I (HIGH SENSITIVITY)
Troponin I (High Sensitivity): 19 ng/L — ABNORMAL HIGH (ref <18)
Troponin I (High Sensitivity): 24 ng/L — ABNORMAL HIGH (ref <18)

## 2023-01-27 LAB — POTASSIUM: Potassium: 5.4 mmol/L — ABNORMAL HIGH (ref 3.5–5.1)

## 2023-01-27 LAB — HIV ANTIBODY (ROUTINE TESTING W REFLEX): HIV Screen 4th Generation wRfx: NONREACTIVE

## 2023-01-27 LAB — BRAIN NATRIURETIC PEPTIDE: B Natriuretic Peptide: 156.4 pg/mL — ABNORMAL HIGH (ref 0.0–100.0)

## 2023-01-27 MED ORDER — ACETAMINOPHEN 500 MG PO TABS: 1000.0000 mg | ORAL_TABLET | Freq: Three times a day (TID) | ORAL | Status: DC | PRN

## 2023-01-27 MED ORDER — FAMOTIDINE 20 MG PO TABS
40.0000 mg | ORAL_TABLET | Freq: Every day | ORAL | Status: DC
  Administered 2023-01-27 – 2023-02-06 (×11): 40 mg via ORAL
  Filled 2023-01-27 (×11): qty 2

## 2023-01-27 MED ORDER — ACETAMINOPHEN 325 MG PO TABS
650.0000 mg | ORAL_TABLET | Freq: Four times a day (QID) | ORAL | Status: DC | PRN
  Administered 2023-01-29 – 2023-02-04 (×3): 650 mg via ORAL
  Filled 2023-01-27 (×3): qty 2

## 2023-01-27 MED ORDER — METHADONE HCL 10 MG/5ML PO SOLN: 30.0000 mg | Freq: Every day | ORAL | Status: DC

## 2023-01-27 MED ORDER — CHLORHEXIDINE GLUCONATE CLOTH 2 % EX PADS
6.0000 | MEDICATED_PAD | Freq: Every day | CUTANEOUS | Status: DC
  Administered 2023-01-27 – 2023-02-06 (×11): 6 via TOPICAL

## 2023-01-27 MED ORDER — SENNOSIDES-DOCUSATE SODIUM 8.6-50 MG PO TABS
2.0000 | ORAL_TABLET | Freq: Two times a day (BID) | ORAL | Status: DC
  Administered 2023-01-27 – 2023-02-04 (×15): 2 via ORAL
  Filled 2023-01-27 (×19): qty 2

## 2023-01-27 MED ORDER — LUBIPROSTONE 24 MCG PO CAPS
24.0000 ug | ORAL_CAPSULE | Freq: Two times a day (BID) | ORAL | Status: DC
  Administered 2023-01-27 – 2023-02-06 (×19): 24 ug via ORAL
  Filled 2023-01-27 (×22): qty 1

## 2023-01-27 MED ORDER — FUROSEMIDE 10 MG/ML IJ SOLN
20.0000 mg | Freq: Once | INTRAMUSCULAR | Status: AC
  Administered 2023-01-27: 20 mg via INTRAVENOUS
  Filled 2023-01-27: qty 2

## 2023-01-27 MED ORDER — SACCHAROMYCES BOULARDII 250 MG PO CAPS
250.0000 mg | ORAL_CAPSULE | Freq: Every day | ORAL | Status: DC
  Administered 2023-01-28 – 2023-02-06 (×10): 250 mg via ORAL
  Filled 2023-01-27 (×10): qty 1

## 2023-01-27 MED ORDER — SODIUM CHLORIDE 0.9 % IV SOLN
6.0000 mg | Freq: Every day | INTRAVENOUS | Status: DC
  Administered 2023-01-27 – 2023-01-29 (×3): 6 mg via INTRAVENOUS
  Filled 2023-01-27 (×4): qty 0.6

## 2023-01-27 MED ORDER — ALLOPURINOL 100 MG PO TABS
100.0000 mg | ORAL_TABLET | Freq: Every day | ORAL | Status: DC
  Administered 2023-01-28 – 2023-02-06 (×10): 100 mg via ORAL
  Filled 2023-01-27 (×10): qty 1

## 2023-01-27 MED ORDER — AMLODIPINE BESYLATE 10 MG PO TABS
10.0000 mg | ORAL_TABLET | Freq: Every day | ORAL | Status: DC
  Administered 2023-01-28 – 2023-02-06 (×10): 10 mg via ORAL
  Filled 2023-01-27 (×10): qty 1

## 2023-01-27 MED ORDER — OLMESARTAN-AMLODIPINE-HCTZ 40-10-25 MG PO TABS: 1.0000 | ORAL_TABLET | Freq: Every day | ORAL | Status: DC

## 2023-01-27 MED ORDER — ACETAMINOPHEN 650 MG RE SUPP: 650.0000 mg | Freq: Four times a day (QID) | RECTAL | Status: DC | PRN

## 2023-01-27 MED ORDER — VITAMIN C 500 MG PO TABS
500.0000 mg | ORAL_TABLET | Freq: Two times a day (BID) | ORAL | Status: DC
  Administered 2023-01-27 – 2023-02-06 (×20): 500 mg via ORAL
  Filled 2023-01-27 (×20): qty 1

## 2023-01-27 MED ORDER — SODIUM ZIRCONIUM CYCLOSILICATE 10 G PO PACK
10.0000 g | PACK | Freq: Every day | ORAL | Status: DC
  Administered 2023-01-27: 10 g via ORAL
  Filled 2023-01-27: qty 1

## 2023-01-27 MED ORDER — SODIUM CHLORIDE 0.9 % IV BOLUS
500.0000 mL | Freq: Once | INTRAVENOUS | Status: AC
  Administered 2023-01-27: 500 mL via INTRAVENOUS

## 2023-01-27 MED ORDER — ENSURE ENLIVE PO LIQD
237.0000 mL | Freq: Two times a day (BID) | ORAL | Status: DC
  Administered 2023-01-28 – 2023-02-06 (×9): 237 mL via ORAL

## 2023-01-27 MED ORDER — CLOTRIMAZOLE 1 % EX CREA
1.0000 | TOPICAL_CREAM | Freq: Two times a day (BID) | CUTANEOUS | Status: DC
  Administered 2023-01-28 – 2023-02-06 (×18): 1 via TOPICAL
  Filled 2023-01-27 (×2): qty 15

## 2023-01-27 MED ORDER — FERROUS SULFATE 325 (65 FE) MG PO TABS
325.0000 mg | ORAL_TABLET | Freq: Every day | ORAL | Status: DC
  Administered 2023-01-29 – 2023-02-06 (×9): 325 mg via ORAL
  Filled 2023-01-27 (×9): qty 1

## 2023-01-27 MED ORDER — MAGNESIUM HYDROXIDE 400 MG/5ML PO SUSP: 30.0000 mL | Freq: Every day | ORAL | Status: DC | PRN

## 2023-01-27 MED ORDER — DICLOFENAC SODIUM 1 % EX GEL
2.0000 g | Freq: Three times a day (TID) | CUTANEOUS | Status: DC
  Administered 2023-01-27 – 2023-02-06 (×25): 2 g via TOPICAL
  Filled 2023-01-27 (×2): qty 100

## 2023-01-27 MED ORDER — POLYETHYLENE GLYCOL 3350 17 G PO PACK
17.0000 g | PACK | Freq: Two times a day (BID) | ORAL | Status: DC
  Administered 2023-01-27 – 2023-02-06 (×20): 17 g via ORAL
  Filled 2023-01-27 (×19): qty 1

## 2023-01-27 MED ORDER — NIRMATRELVIR&RITONAVIR 300/100 20 X 150 MG & 10 X 100MG PO TBPK: 3.0000 | ORAL_TABLET | Freq: Two times a day (BID) | ORAL | Status: DC

## 2023-01-27 MED ORDER — METHADONE HCL 10 MG PO TABS
10.0000 mg | ORAL_TABLET | Freq: Three times a day (TID) | ORAL | Status: DC
  Administered 2023-01-27 – 2023-02-06 (×30): 10 mg via ORAL
  Filled 2023-01-27 (×30): qty 1

## 2023-01-27 NOTE — ED Provider Notes (Signed)
Signout from [employer] at shift change. Briefly, patient presents for shortness of breath in setting of recent diagnosed COVID.  Patient is bedbound due to paraplegia.  He uses CPAP machine for obstructive sleep apnea.  Was started on Paxlovid and had troubleshooting done for his CPAP at visit 2 days ago.   Plan: Follow-up lab results   7:26 AM Reassessment performed. Patient appears stable.  Currently 5 L humidified O2.  While sleeping he desats into the upper 80s on oxygen.  Once he is roused, this recovers into the 90s.  Labs and imaging personally reviewed and interpreted including: CBC demonstrates normal white blood cell count at 4.9, hemoglobin low at 10.5, platelets much lower than previous at 69; CMP with mild hyperkalemia at 5.4, creatinine elevated to 2.17 with a BUN of 40, glucose 153 with normal anion gap; lactate normal at 1.6; repeat venous blood gas shows pH 7.234 with CO2 of 62.7, normal bicarb, potassium of 5.4; troponin minimally elevated at 19 which is consistent with previous visit.   Chest x-ray appears to have a right lower lobe infiltrate.   Most current vital signs reviewed and are as follows: BP 112/64   Pulse 78   Temp 98.6 F (37 C) (Oral)   Resp 19   Ht 6\' 1"  (1.854 m)   Wt (!) 138 kg   SpO2 97%   BMI 40.14 kg/m   Plan: Will need to admit for hypoxic respiratory failure, AKI and patient with poor respiratory reserve and COVID.  Patient was evaluated for PE 48 hours ago and found to be negative.  He has been having troponins which are borderline elevated but flat.  8:07 AM Discussed patient with IMTS who will see for admission.   BP 131/71   Pulse 72   Temp 98.6 F (37 C) (Oral)   Resp 14   Ht 6\' 1"  (1.854 m)   Wt (!) 138 kg   SpO2 98%   BMI 40.14 kg/m        Renne Crigler, PA-C 01/27/23 8295    Gloris Manchester, MD 01/27/23 1538
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
## 2023-01-27 NOTE — Progress Notes (Signed)
Pt set up on BiPAP. CPAP set @18 >eliminated snoring and apnea. IPAP adjusted to 22 for min volumes of 350 to assisted volumes of 575. Pt tol well. Will monitor and wean as tolerated.
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
## 2023-01-27 NOTE — Progress Notes (Signed)
   Subjective:  Xxx    Objective: Vitals over previous 24hr: Vitals:   01/27/23 1120 01/27/23 1230 01/27/23 1315 01/27/23 1330  BP: 112/71  112/71   Pulse: 89 77 87 98  Resp: 17 16 15 16   Temp: 99.2 F (37.3 C)     TempSrc: Oral     SpO2: 95% 99% 100% 97%  Weight:      Height:        General:      awake and alert, lying comfortably in bed, cooperative, not in acute distress Skin:       warm and dry, intact without any obvious lesions or scars, no rashes Head:      normocephalic and atraumatic, oral mucosa moist with good dentition, no lymphadenopathy Eyes:      extraocular movements intact, conjunctivae pink, pupils round and reactive to light, no periorbital swelling or scleral icterus Ears:       pinnae normal, no discharge or external lesions  Nose:      symmetrical and without mucosal inflammation, no external lesions or discharge Lungs:      normal respiratory effort, breathing unlabored, symmetrical chest rise, no crackles or wheezing Cardiac:      regular rate and rhythm, normal S1 and S2, capillary refill 2-3 seconds, no pitting edema Abdomen:      soft and non-distended, normoactive bowel sounds present in all four quadrants, no tenderness to palpation or guarding Musculoskeletal:  full range of motion in joints, motor strength 5 /5 in all four extremities, no obvious deformities Neurologic:      oriented to person-place-time, moving all extremities, sensation to light touch intact, no gross focal deficits Psychiatric:      euthymic mood with congruent affect, intelligible speech    Assessment/Plan: Xxx    ---Xxx xxx  > xxx  > xxx  > xxx  > xxx  > xxx  > xxx   ---Xxx xxx  > xxx  > xxx  > xxx  > xxx  > xxx   ---Xxx xxx  > xxx  > xxx  > xxx  > xxx   ---Xxx xxx  > xxx  > xxx  > xxx    Principal Problem:   COVID-19 Active Problems:   Paraplegia (HCC)   Chronic pain   Colostomy in place for fecal  diversion  ***   Prior to Admission Living Arrangement:  Anticipated Discharge Location:  Barriers to Discharge:  Dispo: Anticipated discharge in approximately *** day(s).    Lajuana Ripple, MD Internal Medicine PGY-1 Pager [phone]  After 5pm on weekdays and 1pm on weekends: On Call pager [phone]

## 2023-01-27 NOTE — Progress Notes (Signed)
ABG obtained and ran on Istat

## 2023-01-27 NOTE — Progress Notes (Signed)
RT placed ABG order post placing pt on BiPAP. Order Dc'd due to venous drawn and already resulted in lowered CO2 and improved oxygenation w/Ph 7.31. Per Clearance Coots, MD @bedside , no ABG needed @this  time and pt placed back on [place].

## 2023-01-27 NOTE — ED Triage Notes (Signed)
PT BIBEMS w/ c/o SOB. As per report pt was not easily awaken & o2 sat was 90% on room air.

## 2023-01-28 ENCOUNTER — Inpatient Hospital Stay (HOSPITAL_COMMUNITY): Payer: 59

## 2023-01-28 DIAGNOSIS — U071 COVID-19: Principal | ICD-10-CM

## 2023-01-28 DIAGNOSIS — J9602 Acute respiratory failure with hypercapnia: Secondary | ICD-10-CM | POA: Diagnosis not present

## 2023-01-28 DIAGNOSIS — R0609 Other forms of dyspnea: Secondary | ICD-10-CM

## 2023-01-28 DIAGNOSIS — J9601 Acute respiratory failure with hypoxia: Secondary | ICD-10-CM | POA: Diagnosis not present

## 2023-01-28 DIAGNOSIS — L89324 Pressure ulcer of left buttock, stage 4: Secondary | ICD-10-CM | POA: Diagnosis not present

## 2023-01-28 DIAGNOSIS — E43 Unspecified severe protein-calorie malnutrition: Secondary | ICD-10-CM | POA: Diagnosis not present

## 2023-01-28 DIAGNOSIS — Z87891 Personal history of nicotine dependence: Secondary | ICD-10-CM | POA: Diagnosis not present

## 2023-01-28 DIAGNOSIS — L89314 Pressure ulcer of right buttock, stage 4: Secondary | ICD-10-CM | POA: Diagnosis not present

## 2023-01-28 LAB — CBC
HCT: 29.6 % — ABNORMAL LOW (ref 39.0–52.0)
Hemoglobin: 9.3 g/dL — ABNORMAL LOW (ref 13.0–17.0)
MCH: 28.4 pg (ref 26.0–34.0)
MCHC: 31.4 g/dL (ref 30.0–36.0)
MCV: 90.5 fL (ref 80.0–100.0)
Platelets: 222 10*3/uL (ref 150–400)
RBC: 3.27 MIL/uL — ABNORMAL LOW (ref 4.22–5.81)
RDW: 15.5 % (ref 11.5–15.5)
WBC: 16.2 10*3/uL — ABNORMAL HIGH (ref 4.0–10.5)
nRBC: 0 % (ref 0.0–0.2)

## 2023-01-28 LAB — BASIC METABOLIC PANEL
Anion gap: 11 (ref 5–15)
BUN: 44 mg/dL — ABNORMAL HIGH (ref 6–20)
CO2: 23 mmol/L (ref 22–32)
Calcium: 8.1 mg/dL — ABNORMAL LOW (ref 8.9–10.3)
Chloride: 100 mmol/L (ref 98–111)
Creatinine, Ser: 1.9 mg/dL — ABNORMAL HIGH (ref 0.61–1.24)
GFR, Estimated: 40 mL/min — ABNORMAL LOW (ref >60)
Glucose, Bld: 91 mg/dL (ref 70–99)
Potassium: 5.8 mmol/L — ABNORMAL HIGH (ref 3.5–5.1)
Sodium: 134 mmol/L — ABNORMAL LOW (ref 135–145)

## 2023-01-28 LAB — ECHOCARDIOGRAM COMPLETE
Height: 73 in
S' Lateral: 3.65 cm
Weight: 4920.67 oz

## 2023-01-28 MED ORDER — PERFLUTREN LIPID MICROSPHERE
1.0000 mL | INTRAVENOUS | Status: AC | PRN
  Administered 2023-01-28: 2 mL via INTRAVENOUS

## 2023-01-28 MED ORDER — ENOXAPARIN SODIUM 40 MG/0.4ML IJ SOSY
40.0000 mg | PREFILLED_SYRINGE | INTRAMUSCULAR | Status: DC
  Administered 2023-01-28 – 2023-02-05 (×8): 40 mg via SUBCUTANEOUS
  Filled 2023-01-28 (×9): qty 0.4

## 2023-01-28 MED ORDER — SODIUM ZIRCONIUM CYCLOSILICATE 10 G PO PACK
10.0000 g | PACK | Freq: Three times a day (TID) | ORAL | Status: DC
  Administered 2023-01-28 (×3): 10 g via ORAL
  Filled 2023-01-28 (×3): qty 1

## 2023-01-28 MED ORDER — FUROSEMIDE 10 MG/ML IJ SOLN
20.0000 mg | Freq: Once | INTRAMUSCULAR | Status: AC
  Administered 2023-01-28: 20 mg via INTRAVENOUS
  Filled 2023-01-28: qty 2

## 2023-01-28 NOTE — Consult Note (Signed)
WOC Nurse Consult Note: Reason for Consult:Chronic, healing tage 4 pressure injuries to buttocks, sacrum and ischial tuberosities.Patient follows routinely with Dr. Norval Gable at the outpatient wound care center of [facility]. Last seen by that provider 12/28/22 Pressure Injury POA: Yes Measurement:According to Dr. Levada Dy notes 3 weeks ago, the left ischial tuberosity ulcer has closed, the sacral area has a small elliptical opening with no signes of infection and notes that it is superficial and granulating. The right ischial tuberosity has two small open areas-both without infection or concern, granulating and pink. Wound bed:As noted above Drainage (amount, consistency, odor)  Periwound: Dressing procedure/placement/frequency: I have communicated with the patient's Bedside RN K.Sickles and she will perform wound care today. I have provided wound care guidance using the patient's regimen of every other day wound care and a silver hydrofiber however, our brand is not the same as the one he uses at home. We also do not carry Vashe wound cleanser in house so will use NS to cleanse prior to wound care. A pressure redistribution mattress with low air loss feature is provided. Heels are to be floated using pillows.   WOC Nurse ostomy consult note Stoma type/location: LLQ Colostomy  Stomal assessment/size: Not seen today Peristomal assessment: Not seen today Treatment options for stomal/peristomal skin: Skin barrier ring Output: brown stool Ostomy pouching: 2pc. 2 and 3/4 inch ostomy pouching system.  Pouch is [employer], skin barrier is Hart [place] # 2 and skin barrier ring (to be stretched to fit size of ostomy and placed on skin prior to placing skin barrier) is [employer] [place] # [id number].  Recommend ordering 5 of each to the bedside. Enrolled patient in [employer] DC program: No. Patient is established with a provider.  WOC nursing team will not follow, but will remain available to  this patient, the nursing and medical teams.  Please re-consult if needed.  Thank you for inviting [place] to participate in this patient's Plan of Care.  Ladona Mow, MSN, RN, CNS, GNP, Leda Min, [employer], [employer] phone:  [phone]

## 2023-01-28 NOTE — Progress Notes (Signed)
Per pt request, his cell phone was taken to the nurse's station for charging; handed to charge nurse.

## 2023-01-29 ENCOUNTER — Ambulatory Visit: Payer: Self-pay | Admitting: *Deleted

## 2023-01-29 DIAGNOSIS — J9691 Respiratory failure, unspecified with hypoxia: Secondary | ICD-10-CM | POA: Diagnosis not present

## 2023-01-29 DIAGNOSIS — J9692 Respiratory failure, unspecified with hypercapnia: Secondary | ICD-10-CM

## 2023-01-29 DIAGNOSIS — Z87891 Personal history of nicotine dependence: Secondary | ICD-10-CM | POA: Diagnosis not present

## 2023-01-29 DIAGNOSIS — U071 COVID-19: Secondary | ICD-10-CM | POA: Diagnosis not present

## 2023-01-29 LAB — COMPREHENSIVE METABOLIC PANEL
ALT: 28 U/L (ref 0–44)
AST: 36 U/L (ref 15–41)
Albumin: 3.1 g/dL — ABNORMAL LOW (ref 3.5–5.0)
Alkaline Phosphatase: 65 U/L (ref 38–126)
Anion gap: 9 (ref 5–15)
BUN: 62 mg/dL — ABNORMAL HIGH (ref 6–20)
CO2: 24 mmol/L (ref 22–32)
Calcium: 8.4 mg/dL — ABNORMAL LOW (ref 8.9–10.3)
Chloride: 101 mmol/L (ref 98–111)
Creatinine, Ser: 1.83 mg/dL — ABNORMAL HIGH (ref 0.61–1.24)
GFR, Estimated: 42 mL/min — ABNORMAL LOW (ref >60)
Glucose, Bld: 120 mg/dL — ABNORMAL HIGH (ref 70–99)
Potassium: 4.5 mmol/L (ref 3.5–5.1)
Sodium: 134 mmol/L — ABNORMAL LOW (ref 135–145)
Total Bilirubin: 0.4 mg/dL (ref 0.3–1.2)
Total Protein: 7.9 g/dL (ref 6.5–8.1)

## 2023-01-29 LAB — CBC
HCT: 29.2 % — ABNORMAL LOW (ref 39.0–52.0)
Hemoglobin: 9.1 g/dL — ABNORMAL LOW (ref 13.0–17.0)
MCH: 27.9 pg (ref 26.0–34.0)
MCHC: 31.2 g/dL (ref 30.0–36.0)
MCV: 89.6 fL (ref 80.0–100.0)
Platelets: 223 10*3/uL (ref 150–400)
RBC: 3.26 MIL/uL — ABNORMAL LOW (ref 4.22–5.81)
RDW: 15.4 % (ref 11.5–15.5)
WBC: 10 10*3/uL (ref 4.0–10.5)
nRBC: 0 % (ref 0.0–0.2)

## 2023-01-29 MED ORDER — MUPIROCIN 2 % EX OINT
1.0000 | TOPICAL_OINTMENT | Freq: Two times a day (BID) | CUTANEOUS | Status: AC
  Administered 2023-01-30 – 2023-02-03 (×9): 1 via NASAL
  Filled 2023-01-29 (×3): qty 22

## 2023-01-29 MED ORDER — SODIUM CHLORIDE 0.9 % IV SOLN: INTRAVENOUS | Status: DC | PRN

## 2023-01-29 NOTE — Progress Notes (Signed)
   01/29/23 1450  SDOH Interventions  Transportation Interventions Inpatient TOC;Other (Comment) (Medicaid transport, has 24/7 Aide)   Pt states he uses Medicaid transport and has a Dispensing optician. No other SDOH needs reported.

## 2023-01-29 NOTE — Patient Outreach (Signed)
  Care Coordination   Out of county transportation  Visit Note   01/29/2023 Name: [name] MRN: [id number] DOB: [birth year]  [name] is a 57 y.o. year old male who sees [name], [place] for primary care. I  spoke with [name] at [phone] to schedule and confirm out of county transport for Feb 05 2023 0900 Urology visit with Dr [name]  What matters to the patients health and wellness today?  Hospitalized for COVID noted at the time of RN CM outreach to patient/transportation to Urology confirmed    Goals Addressed               This Visit's Progress     Patient Stated     Patient will obtain a stretcher transported visit to wound care center + other MD visit via medicaid transportation [facility]) (pt-stated)   On track     Care Coordination Interventions: Interventions Today    Flowsheet Row Most Recent Value  Chronic Disease   Chronic disease during today's visit Other  General Interventions   General Interventions Discussed/Reviewed General Interventions Reviewed, Doctor Visits, Communication with  Doctor Visits Discussed/Reviewed Doctor Visits Reviewed, Specialist  [facility] for COVID/transportation to Urology confirmed]  PCP/Specialist Visits Compliance with follow-up visit  Communication with PCP/Specialists  [Confirmed out of county transportation with [name] at DSS]              SDOH assessments and interventions completed:  No     Care Coordination Interventions:  Yes, provided   Follow up plan: Follow up call scheduled for 01/31/23    Encounter Outcome:  Pt. Visit Completed    [name] L. Noelle Penner, RN, BSN, CCM [facility] Care Management Community Coordinator Office number [phone]

## 2023-01-29 NOTE — TOC Initial Note (Addendum)
Transition of Care [facility]) - Initial/Assessment Note    Patient Details  Name: [name] MRN: [id number] Date of Birth: 10/02/1966  [facility] CM/SW Contact:    Harriet Masson, RN Phone Number: 01/29/2023, 1:42 PM  Clinical Narrative:                  Spoke to patient regarding transition needs.  Patient states he has an aide that lives with him 24/7. His aide is currently sick with covid.  Patient has all needed DME including WC, walker. Patient uses medicaid transportation [phone]). Call for discharge transportation.  Patient is concerned to discharge until the aide can care for him.  TOC following.    Left VM for [name].   Expected Discharge Plan: Home/Self Care Barriers to Discharge: Continued Medical Work up   Patient Goals and CMS Choice Patient states their goals for this hospitalization and ongoing recovery are:: return home once aide feels better          Expected Discharge Plan and Services       Living arrangements for the past 2 months: Apartment                                      Prior Living Arrangements/Services Living arrangements for the past 2 months: Apartment Lives with:: Other (Comment) (PCS aide) Patient language and need for interpreter reviewed:: Yes Do you feel safe going back to the place where you live?: Yes      Need for Family Participation in Patient Care: Yes (Comment) Care giver support system in place?: Yes (comment) Current home services: Other (comment) (home health aide) Criminal Activity/Legal Involvement Pertinent to Current Situation/Hospitalization: No - Comment as needed  Activities of Daily Living      Permission Sought/Granted                  Emotional Assessment   Attitude/Demeanor/Rapport: Gracious Affect (typically observed): Accepting Orientation: : Oriented to Self, Oriented to Place, Oriented to  Time, Oriented to Situation Alcohol / Substance Use: Not  Applicable Psych Involvement: No (comment)  Admission diagnosis:  Thrombocytopenia (HCC) [D69.6] Acute respiratory failure with hypoxia (HCC) [J96.01] Acute kidney injury (HCC) [N17.9] Acute respiratory failure with hypoxia and hypercapnia (HCC) [J96.01, J96.02] COVID-19 [U07.1] Patient Active Problem List   Diagnosis Date Noted   COVID-19 01/27/2023   Colostomy complication (HCC)    Complicated UTI (urinary tract infection) 01/15/2022   Gout 12/30/2021   Morbid obesity with BMI of 40.0-44.9, adult (HCC) 12/22/2021   Intertriginous skin ulcer (HCC) 12/22/2021   Acute kidney injury (HCC) 12/20/2021   Hypospadias 12/09/2021   Small bowel obstruction (HCC) 10/13/2021   Colon cancer screening 05/20/2021   Bloating 04/11/2021   Chronic indwelling Foley catheter 04/01/2021   Orthopnea 04/01/2021   Obstructive sleep apnea 02/24/2021   Anemia of chronic disease 02/24/2021   Polysubstance abuse (HCC) 07/09/2019   Substance induced mood disorder (HCC) 08/21/2018   GERD without esophagitis 08/14/2018   Benign essential HTN 08/14/2018   Opioid overdose (HCC) 08/14/2018   Drug-induced constipation 02/14/2017   UTI (urinary tract infection)    Colostomy in place for fecal diversion 06/01/2016   Protein-calorie malnutrition, severe (HCC) 05/31/2016   Chronic pain 05/26/2016   Hypertension 05/26/2016   Pressure injury of sacral region, stage 4 (HCC) 05/19/2015   Paraplegia (HCC)    PCP:  [name], PA-C  Pharmacy:   [facility] [address] [place] - [address] [place] [address] Phone: [phone] Fax: [phone]  [facility] - [place], [place] - [address] [place] [address] Phone: [phone] Fax: [phone]     Social Determinants of Health (SDOH) Social History: SDOH Screenings   Food Insecurity: No Food Insecurity (08/29/2022)  Housing: Low Risk  (10/09/2022)  Transportation Needs: Unmet  Transportation Needs (10/09/2022)  Utilities: Not At Risk (08/29/2022)  Depression (PHQ2-9): Low Risk  (08/29/2022)  Financial Resource Strain: Low Risk  (08/29/2022)  Physical Activity: Inactive (10/09/2022)  Social Connections: Unknown (08/29/2022)  Stress: No Stress Concern Present (08/29/2022)  Tobacco Use: Medium Risk (10/09/2022)   SDOH Interventions:     Readmission Risk Interventions     No data to display

## 2023-01-29 NOTE — Patient Instructions (Addendum)
Visit Information  Thank you for taking time to visit with me today. Please don't hesitate to contact me if I can be of assistance to you.   Following are the goals we discussed today:   Goals Addressed               This Visit's Progress     Patient Stated     Patient will obtain a stretcher transported visit to wound care center + other MD visit via medicaid transportation [facility]) (pt-stated)   On track     Care Coordination Interventions: Interventions Today    Flowsheet Row Most Recent Value  Chronic Disease   Chronic disease during today's visit Other  General Interventions   General Interventions Discussed/Reviewed General Interventions Reviewed, Doctor Visits, Communication with  Doctor Visits Discussed/Reviewed Doctor Visits Reviewed, Specialist  [facility] for COVID/transportation to Urology confirmed]  PCP/Specialist Visits Compliance with follow-up visit  Communication with PCP/Specialists  [Confirmed out of county transportation with [name] at [facility]]              Our next appointment is by telephone on 01/31/23 at 1000  Please call the care guide team at [phone] if you need to cancel or reschedule your appointment.   If you are experiencing a Mental Health or Behavioral Health Crisis or need someone to talk to, please call the Suicide and Crisis Lifeline: 988 call the [place] National Suicide Prevention Lifeline: 727-448-6516 or TTY: (707) 198-8037 TTY 820-746-8278) to talk to a trained counselor call 1-800-273-TALK (toll free, 24 hour hotline) go to [facility] [address], [place] [phone]) call 911   Patient verbalizes understanding of instructions and care plan provided today and agrees to view in MyChart. Active MyChart status and patient understanding of how to access instructions and care plan via MyChart confirmed with patient.     The patient has been provided with contact information for the care  management team and has been advised to call with any health related questions or concerns.   Empress Newmann L. Noelle Penner, RN, BSN, CCM [facility] Care Management Community Coordinator Office number [phone]
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
## 2023-01-30 ENCOUNTER — Other Ambulatory Visit (HOSPITAL_COMMUNITY): Payer: Self-pay

## 2023-01-30 DIAGNOSIS — U071 COVID-19: Secondary | ICD-10-CM | POA: Diagnosis not present

## 2023-01-30 DIAGNOSIS — Z87891 Personal history of nicotine dependence: Secondary | ICD-10-CM | POA: Diagnosis not present

## 2023-01-30 LAB — BASIC METABOLIC PANEL
Anion gap: 9 (ref 5–15)
BUN: 61 mg/dL — ABNORMAL HIGH (ref 6–20)
CO2: 24 mmol/L (ref 22–32)
Calcium: 8.6 mg/dL — ABNORMAL LOW (ref 8.9–10.3)
Chloride: 103 mmol/L (ref 98–111)
Creatinine, Ser: 1.57 mg/dL — ABNORMAL HIGH (ref 0.61–1.24)
GFR, Estimated: 50 mL/min — ABNORMAL LOW (ref >60)
Glucose, Bld: 105 mg/dL — ABNORMAL HIGH (ref 70–99)
Potassium: 4.5 mmol/L (ref 3.5–5.1)
Sodium: 136 mmol/L (ref 135–145)

## 2023-01-30 MED ORDER — FLUTICASONE PROPIONATE 50 MCG/ACT NA SUSP
1.0000 | Freq: Every day | NASAL | Status: DC
  Administered 2023-01-30 – 2023-02-06 (×7): 1 via NASAL
  Filled 2023-01-30 (×2): qty 16

## 2023-01-30 MED ORDER — AMLODIPINE BESYLATE 10 MG PO TABS
10.0000 mg | ORAL_TABLET | Freq: Every day | ORAL | 0 refills | Status: AC
  Filled 2023-01-30: qty 30, 30d supply, fill #0

## 2023-01-30 NOTE — TOC Progression Note (Addendum)
Transition of Care (TOC) - Progression Note  Donn Pierini RN, BSN Transitions of Care Unit 4E- RN Case Manager See Treatment Team for direct phone # [phone] Cross Coverage  Patient Details  Name: [name] MRN: [id number] Date of Birth: 24-Feb-1966  Transition of Care [facility]) CM/SW Contact  Zenda Alpers, Lenn Sink, RN Phone Number: 01/30/2023, 2:36 PM  Clinical Narrative:    Notified by attending team pt medically stable for transition home today however pt is concerned about returning home stating that his aide is sick and unable to assist in caring for pt.   Pt's aide is with [facility] [employer] [phone])- CM reached out to agency to check on pt's aide services. Per TC with [employer] confirmed pt has [employer]CS aide with with them- approved w/ Medicaid benefit for 80 hrs/mo, receives 18.5 hr/week. Per agency pt's aide last clocked hours on 4/26- day prior to pt's admission. They do not have any record of aide calling in to report illness. Per [facility] if pt has assistance past these approved hours- pt has made arrangements on his own.   CM attempted to call pt in room- however when pt answered phone and CM attempted conversation to speak with pt about his aide services- pt hung up on CM. Attempted to call pt back however pt would not answer phone at this time.   Per attending team, d/c order to be placed, pt refusing to leave, will deliver IM to pt for Medicare appeal rights.   TOC will continue to follow, if pt calls in Medicare appeal then will have to allow Medicare to review appeal, if pt does not call in appeal then [facility] will follow up to assist transitioning home.   Will need to confirm with pt that he has key to apartment.    Expected Discharge Plan: Home/Self Care Barriers to Discharge: Other (must enter comment) (pt refusing discharge)  Expected Discharge Plan and Services   Discharge Planning Services: CM Consult Post Acute Care Choice: NA Living arrangements for the past 2  months: Apartment Expected Discharge Date: 01/30/23               DME Arranged: N/A DME Agency: NA       HH Arranged: NA HH Agency: NA         Social Determinants of Health (SDOH) Interventions SDOH Screenings   Food Insecurity: No Food Insecurity (08/29/2022)  Housing: Low Risk  (10/09/2022)  Transportation Needs: Unmet Transportation Needs (10/09/2022)  Utilities: Not At Risk (08/29/2022)  Depression (PHQ2-9): Low Risk  (08/29/2022)  Financial Resource Strain: Low Risk  (08/29/2022)  Physical Activity: Inactive (10/09/2022)  Social Connections: Unknown (08/29/2022)  Stress: No Stress Concern Present (08/29/2022)  Tobacco Use: Medium Risk (10/09/2022)    Readmission Risk Interventions     No data to display

## 2023-01-30 NOTE — Progress Notes (Signed)
I went to discuss with the patient that he was medically ready for discharge today. Yesterday I discussed with him that today was the day for discharge if stable as I intended to discharge yesterday but he was not ready. I was met with a good amount of resistance, primarily the patient saying that his home health aide is sick with COVID. I spoke with the aide [name] this morning who confirmed this and that he could not care for the patient. At the bedside today I discussed to the patient that he was medically ready for discharge. I told him also that I involved social work to see if there were other needs or barriers to discharge. Social work communicated that lack of the aide was not a barrier to discharge and that he would need to call the agency to get another aide or come out of pocket for an aide while his is recovering- but, that he was able to discharge and that was not a barrier to discharge or a reason to keep him in the hospital. The patient then responded saying that was not what those teams said. I told him that he could appeal if he wanted but that medically he was ready and would be discharged. He proceeded to yell saying " You are going to put me on the damn street?! No I'm not going anywhere". He continued to yell for me to get out of the room and repeatedly stated that "these damn doctors" think they can do what they want and that he wasn't leaving until he wanted to. Throughout admission the patient alluded daily to his experience of consistently being discharged against his will before he is ready to leave and made it known he would not leave until he is ready on numerous occasions. Multiple team members have experience with the patient refusing to discharge when medically ready during past admission. Per  case manager Donn Pierini  "spoke with PCS agency- [facility]- pt is approved for 18.5 hr/week (80 hr month)- they do not have a record of their aide being out sick- only that they have  not worked since Friday- pt must pay someone privately for any hours past that- I just tried to call the room- and the pt hung up on me ". The patient is being delivered information to the room to appeal the discharge process if he wishes but he has been officially discharged.
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
## 2023-01-30 NOTE — Progress Notes (Deleted)
Respiratory made aware that patient needs CPAP set up in new room.

## 2023-01-30 NOTE — Progress Notes (Signed)
Pt has called Medicare appeals- CM has spoken with pt over TC- reviewed both Detailed notice of discharge and HINN 12 to patient. TOC will fax in needed paperwork to start appeals process. Pt provided verbal permission for signature on HINN 12 that paperwork was reviewed and he had no further questions at this time.
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
## 2023-01-30 NOTE — Care Management Important Message (Signed)
Important Message  Patient Details  Name: [name] MRN: [id number] Date of Birth: 18-Oct-1965   Medicare Important Message Given:  Yes     Fayrene Towner Stefan Church 01/30/2023, 3:44 PM

## 2023-01-30 NOTE — Discharge Instructions (Signed)
You were hospitalized for COVID causing trouble breathing. You were treated with steroids and given supplemental oxygen. Your breathing improved. You also came in with a kidney injury likely from COVID. That improved during admission but is not back to baseline. We are holding your combination amlodipine-HCTZ-olmesartan blood pressure medicine due to this kidney injury as this combination can worsen the kidneys, so please do not take this any more. We are sending you home with Amlodipine 10mg  daily to help with blood pressure as this does not affect your kidneys. You kidney function should return to normal and you should follow up with your primary care doctor within 1 week to get your kidney function checked. If unable to secure a follow up appointment call the [facility] at [phone] to schedule a hospital follow up. At home:  1) STOP Olmesartan-amLODIPine-HCTZ 40-10-25 daily  2)START amlodipine 10 mg once daily  It was a pleasure caring for you.
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
## 2023-01-30 NOTE — TOC Progression Note (Signed)
Transition of Care [facility]) - Progression Note    Patient Details  Name: [name] MRN: [id number] Date of Birth: Aug 11, 1966  Transition of Care [facility]) CM/SW Contact  Dorena Bodo Phone Number: 01/30/2023, 4:45 PM  Clinical Narrative:     [employer] Detailed Notice of Discharge letter created and saved:  Donn Pierini) Detailed Notice of Discharge Document Given to [employer]: Yes [employer] Document Created: Yes Donn Pierini) [employer] appeal documents uploaded to [employer] stite: Yes Dorena Bodo)

## 2023-01-31 ENCOUNTER — Encounter: Payer: Self-pay | Admitting: *Deleted

## 2023-01-31 ENCOUNTER — Ambulatory Visit: Payer: Self-pay | Admitting: *Deleted

## 2023-01-31 DIAGNOSIS — N179 Acute kidney failure, unspecified: Secondary | ICD-10-CM

## 2023-01-31 NOTE — Progress Notes (Signed)
Placed patient on CPAP for the night.  

## 2023-01-31 NOTE — Patient Instructions (Signed)
Visit Information  Thank you for taking time to visit with me today. Please don't hesitate to contact me if I can be of assistance to you.   Following are the goals we discussed today:   Goals Addressed               This Visit's Progress     Patient Stated     Patient will obtain a stretcher transported visit to wound care center + other MD visit via medicaid transportation [facility]) (pt-stated)   Not on track     Care Coordination Interventions: Interventions Today    Flowsheet Row Most Recent Value  Chronic Disease   Chronic disease during today's visit Other  [remains hospitalized]              Our next appointment is by telephone on 02/07/23 at 0915  Please call the care guide team at [phone] if you need to cancel or reschedule your appointment.   If you are experiencing a Mental Health or Behavioral Health Crisis or need someone to talk to, please call the Suicide and Crisis Lifeline: 988 call the [place] National Suicide Prevention Lifeline: (240) 276-1449 or TTY: (579)267-5620 TTY 440-809-3431) to talk to a trained counselor call 1-800-273-TALK (toll free, 24 hour hotline) go to [facility] [address], [place] [phone]) call 911   Patient verbalizes understanding of instructions and care plan provided today and agrees to view in MyChart. Active MyChart status and patient understanding of how to access instructions and care plan via MyChart confirmed with patient.     The patient has been provided with contact information for the care management team and has been advised to call with any health related questions or concerns.   Placida Cambre L. Noelle Penner, RN, BSN, CCM [facility] Care Management Community Coordinator Office number [phone]

## 2023-01-31 NOTE — Progress Notes (Signed)
   S: Patient is pleasant and comfortable on my exam today.  He reports his breathing is stable.  Eating and drinking well.  Tolerating all his medications okay.  Feeling stress and anxiety about his ability to return home with adequate support.  O:  Vitals:   01/31/23 1131 01/31/23 1424  BP: 117/67 (!) 127/58  Pulse: 70 75  Resp: 17 20  Temp: 98.3 F (36.8 C) 98.4 F (36.9 C)  SpO2: 95% 95%   Gen: Well-appearing man, no distress Heart: Regular rate and rhythm with no murmurs Lungs: Unlabored, saturating well on room air, clear to auscultation throughout Ext: Warm and well-perfused, no lower extremity edema  A/P:  Principal Problem:   COVID-19 Active Problems:   Paraplegia (HCC)   Chronic pain   Colostomy in place for fecal diversion  Hospital day #4 for this 57 year old person living with physical disability due to paraplegia admitted to our service for declining functional status at home due to symptomatic COVID-19 infection.  He seems to be recovering well from the COVID infection.  He is not hypoxic, not requiring dexamethasone any more.  He still does feel generally weak, and he feels his functional status is not adequate to safely return home.  He is also not sure if he will have caregiver access as his usual caregiver is currently also sick with COVID.  We will talk with his home health agency and see if there is a return to work date for his current caregiver or if they can find a temporary replacement that can help the patient better function at home.  Will continue methadone 30 mg daily for his chronic pain syndrome.  Also continue nightly CPAP for treatment of severe obstructive sleep apnea.   Tyson Alias, MD 01/31/2023, 3:44 PM

## 2023-01-31 NOTE — Patient Outreach (Addendum)
  Care Coordination   Follow Up Visit Note   01/31/2023 Name: Liborio Saccente MRN: [id number] DOB: [birth year]  Asmar Peddie is a 57 y.o. year old male who sees [name], [place] for primary care. I  noted patient remains hospitalized on today 01/31/23  What matters to the patients health and wellness today?  Hospitalized for covid  Pending appointment for urology on 02/05/23 (transportation is already scheduled) and wound care center on 02/08/23 (pending transportation scheduling)   Goals Addressed   None     SDOH assessments and interventions completed:  No     Care Coordination Interventions:  Yes, provided   Follow up plan: Follow up call scheduled for 02/07/23    Encounter Outcome:  Pt. Visit Completed   Demir Titsworth L. Noelle Penner, RN, BSN, CCM [facility] Care Management Community Coordinator Office number [phone]
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
## 2023-01-31 NOTE — TOC Progression Note (Signed)
Transition of Care [facility]) - Progression Note    Patient Details  Name: [name] MRN: [id number] Date of Birth: 1966-05-16  Transition of Care [facility]) CM/SW Contact  Harriet Masson, RN Phone Number: 01/31/2023, 1:01 PM  Clinical Narrative:     Shanda Bumps from ,[facility] [phone]), states that patient's adie is named [name]. This RNCM requested Shanda Bumps from [facility] to reach out to [name] to request him to return this [facility] call.  Patient states he doesn't have his house key and has no one that can bring it to him.   Expected Discharge Plan: Home/Self Care Barriers to Discharge: Other (must enter comment) (pt refusing discharge)  Expected Discharge Plan and Services   Discharge Planning Services: CM Consult Post Acute Care Choice: NA Living arrangements for the past 2 months: Apartment Expected Discharge Date: 01/30/23               DME Arranged: N/A DME Agency: NA       HH Arranged: NA HH Agency: NA         Social Determinants of Health (SDOH) Interventions SDOH Screenings   Food Insecurity: No Food Insecurity (01/31/2023)  Housing: Low Risk  (01/31/2023)  Transportation Needs: Unmet Transportation Needs (01/31/2023)  Utilities: Not At Risk (01/31/2023)  Depression (PHQ2-9): Low Risk  (08/29/2022)  Financial Resource Strain: Low Risk  (08/29/2022)  Physical Activity: Inactive (10/09/2022)  Social Connections: Unknown (08/29/2022)  Stress: No Stress Concern Present (08/29/2022)  Tobacco Use: Medium Risk (10/09/2022)    Readmission Risk Interventions     No data to display

## 2023-02-01 DIAGNOSIS — U071 COVID-19: Secondary | ICD-10-CM | POA: Diagnosis not present

## 2023-02-01 LAB — BASIC METABOLIC PANEL
Anion gap: 12 (ref 5–15)
BUN: 47 mg/dL — ABNORMAL HIGH (ref 6–20)
CO2: 25 mmol/L (ref 22–32)
Calcium: 8.9 mg/dL (ref 8.9–10.3)
Chloride: 99 mmol/L (ref 98–111)
Creatinine, Ser: 1.33 mg/dL — ABNORMAL HIGH (ref 0.61–1.24)
GFR, Estimated: >60 mL/min (ref >60)
Glucose, Bld: 82 mg/dL (ref 70–99)
Potassium: 4.4 mmol/L (ref 3.5–5.1)
Sodium: 136 mmol/L (ref 135–145)
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
## 2023-02-01 NOTE — Plan of Care (Signed)

## 2023-02-01 NOTE — TOC Progression Note (Addendum)
Transition of Care [facility]) - Progression Note    Patient Details  Name: [name] MRN: [id number] Date of Birth: Jul 30, 1966  Transition of Care [facility]) CM/SW Contact  Janae Bridgeman, RN Phone Number: 02/01/2023, 11:32 AM  Clinical Narrative:    Cm met with the patient at the bedside and patient states that he is unable to go home until his caregiver is able to take care of him in the home.  MD is aware.  The patient states that [name], caregiver is at the doctor's this morning and will follow up with me to see if he can discharge home today or not.  I called [name]'s number but was unable to contact him directly.  Patient states that he will follow up with me.  I called [facility] and left a detailed voice message at 1100.  02/01/2023 1423 - I met with the patient at the bedside and the patient states that he spoke with [name], caregiver on the phone and he is unable to provide care to patient this evening since he is sick.  Patient states that [name] went to the doctor this afternoon and was filling prescriptions.  I called and home health agency - [facility] and left a detailed voicemail to see if another tech is available to assist the patient in the home upon discharge tomorrow if [name] is unable.  Patient is aware and states that he wants to go home as well but does not feel safe going home alone.  MD updated that I will follow up with the patient and agency regarding contact with caregiver availability.   Expected Discharge Plan: Home/Self Care Barriers to Discharge: Other (must enter comment) (pt refusing discharge)  Expected Discharge Plan and Services   Discharge Planning Services: CM Consult Post Acute Care Choice: NA Living arrangements for the past 2 months: Apartment Expected Discharge Date: 01/30/23               DME Arranged: N/A DME Agency: NA       HH Arranged: NA HH Agency: NA         Social Determinants of Health (SDOH)  Interventions SDOH Screenings   Food Insecurity: No Food Insecurity (01/31/2023)  Housing: Low Risk  (01/31/2023)  Transportation Needs: Unmet Transportation Needs (01/31/2023)  Utilities: Not At Risk (01/31/2023)  Depression (PHQ2-9): Low Risk  (08/29/2022)  Financial Resource Strain: Low Risk  (08/29/2022)  Physical Activity: Inactive (10/09/2022)  Social Connections: Unknown (08/29/2022)  Stress: No Stress Concern Present (08/29/2022)  Tobacco Use: Medium Risk (10/09/2022)    Readmission Risk Interventions     No data to display

## 2023-02-01 NOTE — Progress Notes (Signed)
Pt does not want to wear cpap.

## 2023-02-02 ENCOUNTER — Other Ambulatory Visit (HOSPITAL_COMMUNITY): Payer: Self-pay

## 2023-02-02 DIAGNOSIS — U071 COVID-19: Secondary | ICD-10-CM | POA: Diagnosis not present
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
## 2023-02-02 NOTE — TOC Progression Note (Signed)
Transition of Care [facility]) - Progression Note    Patient Details  Name: [name] MRN: [id number] Date of Birth: [birth year]  Transition [facility]) CM/SW Contact  Janae Bridgeman, RN Phone Number: 02/02/2023, 10:57 AM  Clinical Narrative:    CM met with the patient at the bedside and the patient states that is caregiver is unavailable to care for him at the home until tomorrow.  The patient was agreeable to discharge tomorrow to home by PTAR.  Medical Necessity was filled out in preparation for tomorrow's return to home.  The patient has all available DME in the home.  Attending MD Team is aware and will follow up with discharge tomorrow.   Expected Discharge Plan: Home/Self Care Barriers to Discharge: Other (must enter comment) (pt refusing discharge)  Expected Discharge Plan and Services   Discharge Planning Services: CM Consult Post Acute Care Choice: NA Living arrangements for the past 2 months: Apartment Expected Discharge Date: 01/30/23               DME Arranged: N/A DME Agency: NA       HH Arranged: NA HH Agency: NA         Social Determinants of Health (SDOH) Interventions SDOH Screenings   Food Insecurity: No Food Insecurity (01/31/2023)  Housing: Low Risk  (01/31/2023)  Transportation Needs: Unmet Transportation Needs (01/31/2023)  Utilities: Not At Risk (01/31/2023)  Depression (PHQ2-9): Low Risk  (08/29/2022)  Financial Resource Strain: Low Risk  (08/29/2022)  Physical Activity: Inactive (10/09/2022)  Social Connections: Unknown (08/29/2022)  Stress: No Stress Concern Present (08/29/2022)  Tobacco Use: Medium Risk (10/09/2022)    Readmission Risk Interventions     No data to display

## 2023-02-02 NOTE — TOC Transition Note (Signed)
Discharge medications (1) are being stored in the main pharmacy on the ground floor until patient is ready for discharge.   
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
## 2023-02-04 DIAGNOSIS — U071 COVID-19: Secondary | ICD-10-CM | POA: Diagnosis not present

## 2023-02-04 DIAGNOSIS — L89150 Pressure ulcer of sacral region, unstageable: Secondary | ICD-10-CM

## 2023-02-04 DIAGNOSIS — G8220 Paraplegia, unspecified: Secondary | ICD-10-CM | POA: Diagnosis not present

## 2023-02-04 DIAGNOSIS — G4733 Obstructive sleep apnea (adult) (pediatric): Secondary | ICD-10-CM

## 2023-02-04 DIAGNOSIS — W3400XS Accidental discharge from unspecified firearms or gun, sequela: Secondary | ICD-10-CM | POA: Diagnosis not present

## 2023-02-04 DIAGNOSIS — S34101S Unspecified injury to L1 level of lumbar spinal cord, sequela: Secondary | ICD-10-CM | POA: Diagnosis not present

## 2023-02-04 DIAGNOSIS — Z466 Encounter for fitting and adjustment of urinary device: Secondary | ICD-10-CM
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
## 2023-02-04 NOTE — Care Management (Signed)
Multiple attempts to reach [name], caregiver, Saturday unsuccessful. Patient was able to reach [name] (at same number listed that we had had been using) on Sunday and [name] claims he is unable to care for patient as he has bronchitis flare on COPD.  May need to work on alternative caregivers Monday when offices open.
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
## 2023-02-05 DIAGNOSIS — U071 COVID-19: Secondary | ICD-10-CM | POA: Diagnosis not present

## 2023-02-05 MED ORDER — SENNOSIDES-DOCUSATE SODIUM 8.6-50 MG PO TABS
3.0000 | ORAL_TABLET | Freq: Two times a day (BID) | ORAL | Status: DC
  Administered 2023-02-05 – 2023-02-06 (×2): 3 via ORAL
  Filled 2023-02-05 (×2): qty 3

## 2023-02-05 NOTE — Progress Notes (Signed)
Completed wound care on sacrum, above suprapubic catheter, and under penis. Pt was very upset due to feeling like this was not the right wound care. He requested that we reach out to [facility] Wound care for him. MD made aware.
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
## 2023-02-05 NOTE — TOC Transition Note (Signed)
Pt was not discharged over the week-end. Discharge med (#1) is now stored at [facility] pharmacy.

## 2023-02-05 NOTE — TOC Progression Note (Addendum)
Transition of Care [facility]) - Progression Note    Patient Details  Name: [name] MRN: [id number] Date of Birth: 12/28/1965  [facility]) CM/SW Contact  Janae Bridgeman, RN Phone Number: 02/05/2023, 10:12 AM  Clinical Narrative:    CM called and spoke with the attending physician by phone and the patient's caregiver is unable to provide assistance in the home due to illness.  I called and spoke with Shanda Bumps, Coordinator at [facility] and she is attempting to obtain an alternative caregiver at this time and will follow up with me this morning.  The patient currently receives personal care services Monday from 2 pm - 5:30 pm and Tuesday through Sunday from 2 pm to 4:30 pm through [facility].  02/05/23 - I called and spoke with Shanda Bumps, Coordinator at [facility] and she will provide an alternative caregiver starting tomorrow at 2 pm.  I spoke with the patient and he is aware.  Patient has a key to the home and I will plan to discharge the patient home by [facility] tomorrow before 2 pm.  MD aware.  I called the patient by phone but was unable to reach him.  I will follow up with the patient at the bedside to further discuss TOC needs.   Expected Discharge Plan: Home/Self Care Barriers to Discharge: Other (must enter comment) (pt refusing discharge)  Expected Discharge Plan and Services   Discharge Planning Services: CM Consult Post Acute Care Choice: NA Living arrangements for the past 2 months: Apartment Expected Discharge Date: 01/30/23               DME Arranged: N/A DME Agency: NA       HH Arranged: NA HH Agency: NA         Social Determinants of Health (SDOH) Interventions SDOH Screenings   Food Insecurity: No Food Insecurity (01/31/2023)  Housing: Low Risk  (01/31/2023)  Transportation Needs: Unmet Transportation Needs (01/31/2023)  Utilities: Not At Risk (01/31/2023)  Depression (PHQ2-9): Low Risk  (08/29/2022)   Financial Resource Strain: Low Risk  (08/29/2022)  Physical Activity: Inactive (10/09/2022)  Social Connections: Unknown (08/29/2022)  Stress: No Stress Concern Present (08/29/2022)  Tobacco Use: Medium Risk (10/09/2022)    Readmission Risk Interventions     No data to display

## 2023-02-05 NOTE — Consult Note (Addendum)
WOC Nurse Consult Note: WOC previously consulted on this patient for chronic healing Stage 4 sacral and ischial wounds; see note 01/28/2023 Reason for Consult:chronic foley/inguinal skin breakdown  (Patient does not have a foley, has a suprapubic catheter and is on Lotrimin cream for inguinal folds) Wound type: 1. Full thickness skin loss subpannicular 4 cms x 2 cms x 0.1 cm 100% pink and moist  2.  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  Can cover with dry split gauze.  Clean  area of urethrocutaneous fistula at base of penis with NS, using Q tip applicator pack 1/4" packing strip [employer] [place] #229)  into fistulous opening daily leaving a long end for easy removal, cover with gauze or place ABD pad under area to catch drainage.  Per urology notes this may require surgical repair in future.  Patient had asked that I assess his sacral/ischial wounds although our team had already addressed these wounds 01/28/2023.   I agreed to come back when wound care was being performed.  Patient wishes to have wound care done exactly as he does at home and took great time and care to get his ABD pads taped just like he wanted them.  Patient did turn over and old ABD pads removed.  Patient has multiple healing Stage 4 Pressure Injuries to sacrum and bilateral ischium.  Once old dressing removed this RN made the remark that his wounds looked good with healthy beefy red granulation tissue. Patient  became irate at this statement asking how I could say they looked good when I had never seen them before.  I told him that I had not seen these particular wounds but from a strict wound care perspective this was healthy tissue. Patient very upset about this statement.  Patient states "I don't know why they keep sending different people in here".  I reminded patient that I was not consulted to see these wounds and that he asked I see them and assist with wound care which I agreed to do.  Patient asked at this point that I "get out of his room".   Patient also very upset about bed, stating he uses an air mattress at home and this bed is making his wounds deteriorate.  Again what this RN was able to observe prior to being asked to leave the room was healthy granulation tissue and old scar tissue.  Some serosanguinous drainage from R ischial wound noted before I was asked to leave.  Bedside RN in room and was left to perform dressing change.    I did secure chat patients primary MD regarding reopening of urethrocutaneous fistula that patient says had healed previously.    WOC team will not follow this patient.  Re-consult if further needs arise.   Thank you,    Priscella Mann MSN, RN-BC, [employer] [phone]
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
## 2023-02-06 ENCOUNTER — Other Ambulatory Visit (HOSPITAL_COMMUNITY): Payer: Self-pay

## 2023-02-06 DIAGNOSIS — U071 COVID-19: Secondary | ICD-10-CM | POA: Diagnosis not present

## 2023-02-06 DIAGNOSIS — G8220 Paraplegia, unspecified: Secondary | ICD-10-CM | POA: Diagnosis not present

## 2023-02-06 MED ORDER — SENNOSIDES-DOCUSATE SODIUM 8.6-50 MG PO TABS
2.0000 | ORAL_TABLET | Freq: Two times a day (BID) | ORAL | 0 refills | Status: DC
  Filled 2023-02-06: qty 60, 15d supply, fill #0

## 2023-02-06 NOTE — TOC Transition Note (Signed)
Transition of Care [facility]) - CM/SW Discharge Note   Patient Details  Name: [name] MRN: [id number] Date of Birth: March 03, 1966  Transition of Care [facility]) CM/SW Contact:  Janae Bridgeman, RN Phone Number: 02/06/2023, 11:35 AM   Clinical Narrative:    Cm spoke with the attending physician and the patient will be discharged home today with current PCS Services through [facility].  I called and spoke with Shanda Bumps, Coordinator with [facility] yesterday and she confirmed that she would have a nursing tech available at the home today at 2 pm.  The patient lives alone and has PCS Services thru [facility] Monday through Sunday from 2 pm to 5:30 pm and Tuesdays from 2 pm to 4:30 pm.  PTAR was arranged for patient's transport to home.  Bedside nursing will discuss discharge orders with the patient at the bedside prior to PTAR transport home.   Final next level of care: Home/Self Care Barriers to Discharge: Other (must enter comment) (pt refusing discharge)   Patient Goals and CMS Choice      Discharge Placement                         Discharge Plan and Services Additional resources added to the After Visit Summary for     Discharge Planning Services: CM Consult Post Acute Care Choice: NA          DME Arranged: N/A DME Agency: NA       HH Arranged: NA HH Agency: NA        Social Determinants of Health (SDOH) Interventions SDOH Screenings   Food Insecurity: No Food Insecurity (01/31/2023)  Housing: Low Risk  (01/31/2023)  Transportation Needs: Unmet Transportation Needs (01/31/2023)  Utilities: Not At Risk (01/31/2023)  Depression (PHQ2-9): Low Risk  (08/29/2022)  Financial Resource Strain: Low Risk  (08/29/2022)  Physical Activity: Inactive (10/09/2022)  Social Connections: Unknown (08/29/2022)  Stress: No Stress Concern Present (08/29/2022)  Tobacco Use: Medium Risk (10/09/2022)     Readmission Risk Interventions     No data to display
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
## 2023-02-06 NOTE — TOC Transition Note (Signed)
Transition of Care [facility]) - CM/SW Discharge Note   Patient Details  Name: [name] MRN: [id number] Date of Birth: 04/25/1966  [facility] CM/SW Contact:  Janae Bridgeman, RN Phone Number: 02/06/2023, 11:01 AM   Clinical Narrative:    CM sent a message to Internal Medicine Team that PTAR will be arranged for transport to home once discharge orders are complete.   Bedside nursing to provide discharge instructions to patient once he is ready for discharge.  Medical necessity is completed.   Final next level of care: Home/Self Care Barriers to Discharge: Other (must enter comment) (pt refusing discharge)   Patient Goals and CMS Choice      Discharge Placement                         Discharge Plan and Services Additional resources added to the After Visit Summary for     Discharge Planning Services: CM Consult Post Acute Care Choice: NA          DME Arranged: N/A DME Agency: NA       HH Arranged: NA HH Agency: NA        Social Determinants of Health (SDOH) Interventions SDOH Screenings   Food Insecurity: No Food Insecurity (01/31/2023)  Housing: Low Risk  (01/31/2023)  Transportation Needs: Unmet Transportation Needs (01/31/2023)  Utilities: Not At Risk (01/31/2023)  Depression (PHQ2-9): Low Risk  (08/29/2022)  Financial Resource Strain: Low Risk  (08/29/2022)  Physical Activity: Inactive (10/09/2022)  Social Connections: Unknown (08/29/2022)  Stress: No Stress Concern Present (08/29/2022)  Tobacco Use: Medium Risk (10/09/2022)     Readmission Risk Interventions     No data to display

## 2023-02-07 ENCOUNTER — Ambulatory Visit: Payer: Self-pay | Admitting: *Deleted

## 2023-02-07 NOTE — Patient Outreach (Addendum)
  Care Coordination   Transition of care, follow up, transportation  Visit Note   02/07/2023 Name: [name] MRN: [id number] DOB: 1965/10/30  [name] is a 57 y.o. year old male who sees [name], [place] for primary care. I spoke with  [employer] buyer by phone today.  What matters to the patients health and wellness today?  Follow up after hospital discharge on 02/06/23 with diagnosis of covid Patient discussed his 2 episodes of respiratory distress leading to his hospitalization Vaccinated  Voiced understanding of the education of hypotension/hypertension management. He has a blood pressure cuff at home. He reports he will start checking his blood pressure prior to taking his hypertension medicine. He will hold and notify MD if below 100-110/60 or any dizziness/light headed ness after medicine taken.   In the hospital he was having hypotensive episodes. His medicine was adjusted. He is no longer taking olmesartan amlodipine HCTZ 40-10-25 mg. He is now on amlodipine 10 mg daily He confirms normal BP values Recent appointment with Dr Ileene Rubens Urology at [facility] canceled as patient was hospitalized  Wound Dr [name] opening    Goals Addressed               This Visit's Progress     Patient Stated     Patient will obtain a stretcher transported visit to wound care center + other MD visit via medicaid transportation [facility]) (pt-stated)   On track     Care Coordination Interventions: Interventions Today    Flowsheet Row Most Recent Value  Chronic Disease   Chronic disease during today's visit Other, Hypertension (HTN)  General Interventions   General Interventions Discussed/Reviewed General Interventions Reviewed, Doctor Visits, Communication with  Doctor Visits Discussed/Reviewed --  [encouraged visit with his pcp & pain managment MD then check his schedule for a day to reschedule his [facility] urology appointment]  PCP/Specialist Visits Compliance with follow-up visit  Communication  with PCP/Specialists  [scheduled follow up stretcher ride to Dr Nelda Severe wound care for 02/08/23 0945 with Raiford Noble at non emergency line [phone]]  Exercise Interventions   Exercise Discussed/Reviewed Exercise Discussed, Physical Activity  Physical Activity Discussed/Reviewed Physical Activity Reviewed  Jerilynn Som wheelchair]  Education Interventions   Education Provided Provided Web-based Education, Provided Education  [hypotension]  Provided Verbal Education On Other, Medication  Pharmacy Interventions   Pharmacy Dicussed/Reviewed Pharmacy Topics Discussed, Medications and their functions, Affording Medications              SDOH assessments and interventions completed:  No     Care Coordination Interventions:  Yes, provided   Follow up plan: Follow up call scheduled for 02/14/23    Encounter Outcome:  Pt. Visit Completed   Willmer Fellers L. Noelle Penner, RN, BSN, CCM [facility] Care Management Community Coordinator Office number [phone]

## 2023-02-07 NOTE — Patient Instructions (Addendum)
Visit Information  Thank you for taking time to visit with me today. Please don't hesitate to contact me if I can be of assistance to you.   Following are the goals we discussed today:   Goals Addressed               This Visit's Progress     Patient Stated     Patient will obtain a stretcher transported visit to wound care center + other MD visit via medicaid transportation [facility]) (pt-stated)   On track     Care Coordination Interventions: Interventions Today    Flowsheet Row Most Recent Value  Chronic Disease   Chronic disease during today's visit Other, Hypertension (HTN)  General Interventions   General Interventions Discussed/Reviewed General Interventions Reviewed, Doctor Visits, Communication with  Doctor Visits Discussed/Reviewed --  [encouraged visit with his pcp & pain managment MD then check his schedule for a day to reschedule his [facility] urology appointment]  PCP/Specialist Visits Compliance with follow-up visit  Communication with PCP/Specialists  [scheduled follow up stretcher ride to Dr Nelda Severe wound care for 02/08/23 0945 with Raiford Noble at non emergency line [phone]]  Exercise Interventions   Exercise Discussed/Reviewed Exercise Discussed, Physical Activity  Physical Activity Discussed/Reviewed Physical Activity Reviewed  Jerilynn Som wheelchair]  Education Interventions   Education Provided Provided Web-based Education, Provided Education  [hypotension]  Provided Verbal Education On Other, Medication  Pharmacy Interventions   Pharmacy Dicussed/Reviewed Pharmacy Topics Discussed, Medications and their functions, Affording Medications              Our next appointment is by telephone on 02/14/23 at 0945  Please call the care guide team at [phone] if you need to cancel or reschedule your appointment.   If you are experiencing a Mental Health or Behavioral Health Crisis or need someone to talk to, please call the Suicide and Crisis Lifeline: 988 call the [place]  National Suicide Prevention Lifeline: 915-654-2841 or TTY: (713) 166-9934 TTY 2313964498) to talk to a trained counselor call 1-800-273-TALK (toll free, 24 hour hotline) call the [facility]: (909)866-6094 call 911   Patient verbalizes understanding of instructions and care plan provided today and agrees to view in MyChart. Active MyChart status and patient understanding of how to access instructions and care plan via MyChart confirmed with patient.     The patient has been provided with contact information for the care management team and has been advised to call with any health related questions or concerns.   Ashlei Chinchilla L. Noelle Penner, RN, BSN, CCM [facility] Care Management Community Coordinator Office number [phone]

## 2023-02-14 ENCOUNTER — Ambulatory Visit: Payer: Self-pay | Admitting: *Deleted

## 2023-02-14 NOTE — Patient Outreach (Signed)
  Care Coordination   Follow Up Visit Note   02/14/2023 Name: Femi Webb MRN: [id number] DOB: 1966/03/20  Jakell Juergensen is a 57 y.o. year old male who sees [name], [place] for primary care. I spoke with  [employer] buyer by phone today.  What matters to the patients health and wellness today?  Reschedule visits for [facility] urology nurse foley change an cysto check by Dr [name]- Follow up on recent wound care visit  [facility] nurse visit appointment was missed when he was hospitalized   Patient discussed with Ladonna Snide at [facility] Urology his preference for rescheduled appointments He agreed to go to [facility] as [place] location is booked until July. His appointment will be on March 08 2023 1030 am for catheter change and Shonda placed him on a wait list  Ladonna Snide voiced understanding the his transportation for [facility] visits has to be scheduled with DSS transport services at least 7 days prior to the appointment  Appointment with Dr [name] at [facility] for cysto rescheduled for March 28 2023 at 0915  Wounds are healing well Patient and Dr Nelda Severe pleased with the progression    Goals Addressed               This Visit's Progress     Patient Stated     Patient will obtain a stretcher transported visit to wound care center + other MD visit via medicaid transportation [facility]) (pt-stated)   On track     Care Coordination Interventions: Interventions Today    Flowsheet Row Most Recent Value  Chronic Disease   Chronic disease during today's visit Other  [Reschedule visits for [facility] urology nurse foley change an cysto check by Dr [name]- Follow up on recent wound care visit]  General Interventions   General Interventions Discussed/Reviewed General Interventions Reviewed, [employer], Doctor Visits, Communication with  [March 08 2023 1030 am for catheter change & placed on wait list     Appointment with Dr [name] at [facility] for cysto rescheduled for March 28 2023  at 0915]  Doctor Visits Discussed/Reviewed Doctor Visits Reviewed, Specialist  PCP/Specialist Visits Compliance with follow-up visit  Communication with PCP/Specialists  [Dr [facility] office staff member Robin[place] [phone] & DSS transportation servcies [phone]]  Mental Health Interventions   Mental Health Discussed/Reviewed Mental Health Reviewed, Coping Strategies              SDOH assessments and interventions completed:  No     Care Coordination Interventions:  Yes, provided   Follow up plan: Follow up call scheduled for 03/21/23    Encounter Outcome:  Pt. Visit Completed   [place] Pfefferle L. Noelle Penner, RN, BSN, CCM [facility] Care Management Community Coordinator Office number [phone]

## 2023-02-14 NOTE — Patient Instructions (Signed)
Visit Information  Thank you for taking time to visit with me today. Please don't hesitate to contact me if I can be of assistance to you.   Following are the goals we discussed today:   Goals Addressed               This Visit's Progress     Patient Stated     Patient will obtain a stretcher transported visit to wound care center + other MD visit via medicaid transportation [facility]) (pt-stated)   On track     Care Coordination Interventions: Interventions Today    Flowsheet Row Most Recent Value  Chronic Disease   Chronic disease during today's visit Other  [Reschedule visits for [facility] urology nurse foley change an cysto check by Dr [name]- Follow up on recent wound care visit]  General Interventions   General Interventions Discussed/Reviewed General Interventions Reviewed, [employer], Doctor Visits, Communication with  [March 08 2023 1030 am for catheter change & placed on wait list     Appointment with Dr [name] at [facility] for cysto rescheduled for March 28 2023 at 0915]  Doctor Visits Discussed/Reviewed Doctor Visits Reviewed, Specialist  PCP/Specialist Visits Compliance with follow-up visit  Communication with PCP/Specialists  [Dr [facility] office staff member [phone] & DSS transportation servcies [phone]]  Mental Health Interventions   Mental Health Discussed/Reviewed Mental Health Reviewed, Coping Strategies              Our next appointment is by telephone on 03/21/23 at 0945  Please call the care guide team at [phone] if you need to cancel or reschedule your appointment.   If you are experiencing a Mental Health or Behavioral Health Crisis or need someone to talk to, please call the Suicide and Crisis Lifeline: 988 call the [place] National Suicide Prevention Lifeline: (902)548-3864 or TTY: 2341641809 TTY 325 244 0297) to talk to a trained counselor call 1-800-273-TALK (toll free, 24 hour hotline) go to [facility] Care [address], [place] [phone]) call 911   Patient verbalizes understanding of instructions and care plan provided today and agrees to view in MyChart. Active MyChart status and patient understanding of how to access instructions and care plan via MyChart confirmed with patient.     The patient has been provided with contact information for the care management team and has been advised to call with any health related questions or concerns.   Leda Bellefeuille L. Noelle Penner, RN, BSN, CCM [facility] Care Management Community Coordinator Office number [phone]

## 2023-02-16 NOTE — Progress Notes (Signed)
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

## 2023-03-21 ENCOUNTER — Ambulatory Visit: Payer: Self-pay | Admitting: *Deleted

## 2023-03-21 NOTE — Patient Instructions (Signed)
Visit Information  Thank you for taking time to visit with me today. Please don't hesitate to contact me if I can be of assistance to you.   Following are the goals we discussed today:   Goals Addressed               This Visit's Progress     Patient Stated     Patient will obtain a stretcher transported visit to wound care center + other MD visit via medicaid transportation [facility]) (pt-stated)   On track     Care Coordination Interventions: Interventions Today    Flowsheet Row Most Recent Value  Chronic Disease   Chronic disease during today's visit Other  [medical transportation to wound & urology care]  General Interventions   General Interventions Discussed/Reviewed General Interventions Reviewed, Doctor Visits, Communication with  Doctor Visits Discussed/Reviewed Doctor Visits Reviewed, Specialist  PCP/Specialist Visits Compliance with follow-up visit  Communication with PCP/Specialists  [outreach to patient and to [facility] staff + wound care center]  Mental Health Interventions   Mental Health Discussed/Reviewed Mental Health Reviewed, Coping Strategies  Advanced Directive Interventions   Advanced Directives Discussed/Reviewed Advanced Directives Discussed, Provided resource for acquiring and filling out documents              Our next appointment is by telephone on 04/16/23 at 1115  Please call the care guide team at [phone] if you need to cancel or reschedule your appointment.   If you are experiencing a Mental Health or Behavioral Health Crisis or need someone to talk to, please call the Suicide and Crisis Lifeline: 988 call the [place] National Suicide Prevention Lifeline: 215-296-4105 or TTY: (507)515-9121 TTY 307-215-4920) to talk to a trained counselor call 1-800-273-TALK (toll free, 24 hour hotline) go to [facility] [address] [place] [phone]) call 911   Patient verbalizes understanding of instructions and  care plan provided today and agrees to view in MyChart. Active MyChart status and patient understanding of how to access instructions and care plan via MyChart confirmed with patient.     The patient has been provided with contact information for the care management team and has been advised to call with any health related questions or concerns.   Jazelle Achey L. Noelle Penner, RN, BSN, CCM [facility] Care Management Community Coordinator Office number [phone]

## 2023-03-21 NOTE — Patient Outreach (Signed)
  Care Coordination   Care coordination/transportation  Visit Note   03/21/2023 Name: [name] MRN: [id number] DOB: Oct 05, 1965  [name] is a 57 y.o. year old male who sees [name], [place] for primary care. I spoke with  [employer] buyer by phone today. And [name] at the non emergency [facility] transportation services  What matters to the patients health and wellness today?  Transportation to upcoming medical appointments for 03/22/23 & 03/28/23  Mr Ceravolo confirms he made his transportation appointments this month He voiced appreciation of follow up from RN CM    Goals Addressed               This Visit's Progress     Patient Stated     Patient will obtain a stretcher transported visit to wound care center + other MD visit via medicaid transportation [facility]) (pt-stated)   On track     Care Coordination Interventions: Interventions Today    Flowsheet Row Most Recent Value  Chronic Disease   Chronic disease during today's visit Other  [medical transportation to wound & urology care]  General Interventions   General Interventions Discussed/Reviewed General Interventions Reviewed, Doctor Visits, Communication with  Doctor Visits Discussed/Reviewed Doctor Visits Reviewed, Specialist  PCP/Specialist Visits Compliance with follow-up visit  Communication with PCP/Specialists  [outreach to patient and to [facility] staff + wound care center]  Mental Health Interventions   Mental Health Discussed/Reviewed Mental Health Reviewed, Coping Strategies  Advanced Directive Interventions   Advanced Directives Discussed/Reviewed Advanced Directives Discussed, Provided resource for acquiring and filling out documents              SDOH assessments and interventions completed:  No     Care Coordination Interventions:  Yes, provided   Follow up plan: Follow up call scheduled for 04/16/23    Encounter Outcome:  Pt. Visit Completed   Nadiyah Zeis L. Noelle Penner, RN, BSN, CCM [facility] Care  Management Community Coordinator Office number [phone]

## 2023-03-28 DIAGNOSIS — N360 Urethral fistula: Secondary | ICD-10-CM | POA: Insufficient documentation

## 2023-04-16 ENCOUNTER — Encounter: Payer: Self-pay | Admitting: *Deleted

## 2023-04-25 ENCOUNTER — Ambulatory Visit: Payer: Self-pay | Admitting: *Deleted

## 2023-04-25 NOTE — Patient Instructions (Addendum)
Visit Information  Thank you for taking time to visit with me today. Please don't hesitate to contact me if I can be of assistance to you.   Following are the goals we discussed today:   Goals Addressed               This Visit's Progress     Patient Stated     Patient will obtain a stretcher transported visit to wound care center + other MD visit via medicaid transportation [facility]) (pt-stated)   Not on track     Care Coordination Interventions: Interventions Today    Flowsheet Row Most Recent Value  Chronic Disease   Chronic disease during today's visit Other  [appointment with wound care MD, pinpoint perineal hole leaking urine and yellow pus, Pending [facility] urology appointment with Dr Ileene Rubens. Last [facility] outreach on 04/25/23, Will need out of county transportation-takes time to set up]  General Interventions   General Interventions Discussed/Reviewed General Interventions Reviewed, Doctor Visits  Doctor Visits Discussed/Reviewed Doctor Visits Reviewed, Specialist  PCP/Specialist Visits Compliance with follow-up visit  Mental Health Interventions   Mental Health Discussed/Reviewed Mental Health Reviewed, Coping Strategies              Our next appointment is by telephone on 05/02/23 at 2:30 pm  Please call the care guide team at [phone] if you need to cancel or reschedule your appointment.   If you are experiencing a Mental Health or Behavioral Health Crisis or need someone to talk to, please call the Suicide and Crisis Lifeline: 988 call the [place] National Suicide Prevention Lifeline: (507)736-8685 or TTY: 313 777 9175 TTY 986 572 8315) to talk to a trained counselor call 1-800-273-TALK (toll free, 24 hour hotline) go to [facility] [address] [place] [phone]) call 911   Patient verbalizes understanding of instructions and care plan provided today and agrees to view in MyChart. Active MyChart status and patient  understanding of how to access instructions and care plan via MyChart confirmed with patient.     The patient has been provided with contact information for the care management team and has been advised to call with any health related questions or concerns.   Mylinh Cragg L. Noelle Penner, RN, BSN, CCM [facility] Care Management Community Coordinator Office number [phone]

## 2023-04-25 NOTE — Patient Outreach (Signed)
  Care Coordination   Follow Up Visit Note   04/25/2023 Name: [name] MRN: [id number] DOB: 1966/01/17  [name] is a 57 y.o. year old male who sees [name], [place] for primary care. I spoke with  [name] by phone today.  What matters to the patients health and wellness today?  Upcoming wound care [name] was able to schedule his own wound care transportation  Urology Noted suprapubic discomfort urine and yellow discharge coming from a pinpoint tiny hole between his scrotum and penis that Dr Ileene Rubens is aware of. This is an inconvenience to [name] as he has episodes of saturating his clothes. He notices that if his catheter gets kinked his urine comes out of this tiny hole.     Goals Addressed               This Visit's Progress     Patient Stated     Patient will obtain a stretcher transported visit to wound care center + other MD visit via medicaid transportation [facility]) (pt-stated)   Not on track     Care Coordination Interventions: Interventions Today    Flowsheet Row Most Recent Value  Chronic Disease   Chronic disease during today's visit Other  [appointment with wound care MD, pinpoint perineal hole leaking urine and yellow pus, Pending [facility] urology appointment with Dr Ileene Rubens. Last [facility] outreach on 04/25/23, Will need out of county transportation-takes time to set up]  General Interventions   General Interventions Discussed/Reviewed General Interventions Reviewed, Doctor Visits  Doctor Visits Discussed/Reviewed Doctor Visits Reviewed, Specialist  PCP/Specialist Visits Compliance with follow-up visit  Mental Health Interventions   Mental Health Discussed/Reviewed Mental Health Reviewed, Coping Strategies              SDOH assessments and interventions completed:  No     Care Coordination Interventions:  Yes, provided   Follow up plan: Follow up call scheduled for 05/02/23    Encounter Outcome:  Pt. Visit Completed   [name]  L. Noelle Penner, RN, BSN, CCM [facility] Care Management Community Coordinator Office number [phone]

## 2023-04-27 ENCOUNTER — Ambulatory Visit: Payer: Self-pay | Admitting: *Deleted

## 2023-04-27 NOTE — Patient Outreach (Signed)
  Care Coordination   Follow Up Visit Note   04/27/2023 Name: Wilder Zeni MRN: [id number] DOB: 27-Jan-1966  Demetris Skillings is a 57 y.o. year old male who sees [name], [place] for primary care. I spoke with  [employer] buyer by phone today.  What matters to the patients health and wellness today?  Mr Regehr confirmed he received calls from his urologist for an office visit 05/07/23 and pending surgery 06/20/23 (with placement on waiting list for a possible earlier surgery visit)  He confirms he would prefer the surgery visit to NOT be on a Thursday or Saturday. He will need return transport [facility] after the surgery   He confirmed his attendance to his wound care provider as scheduled    Goals Addressed               This Visit's Progress     Patient Stated     Patient will obtain a stretcher transported visit to wound care center + out of county medicaid transportation as needed, decrease hospital visits [facility]) (pt-stated)   On track     Care Coordination Interventions: Interventions Today    Flowsheet Row Most Recent Value  Chronic Disease   Chronic disease during today's visit Other  [urology visits & transportation]  General Interventions   General Interventions Discussed/Reviewed --  Hanley Hays Mr Dumas has decreased his hospitalizations with routine wound care and urology office visits]  Doctor Visits Discussed/Reviewed Doctor Visits Reviewed, Specialist  PCP/Specialist Visits Compliance with follow-up visit  [confirmed transportation resource called by patient for his 05/07/23 urology office visit + pending transportation for urology surgery date. Discussed the importance of the preferred date for scheduling surgery to coincide with return transportation]              SDOH assessments and interventions completed:  No     Care Coordination Interventions:  Yes, provided   Follow up plan: Follow up call scheduled for 06/06/23    Encounter  Outcome:  Pt. Visit Completed   Charman Blasco L. Noelle Penner, RN, BSN, CCM [facility] Care Management Community Coordinator Office number [phone]

## 2023-04-27 NOTE — Patient Instructions (Signed)
Visit Information  Thank you for taking time to visit with me today. Please don't hesitate to contact me if I can be of assistance to you.   Following are the goals we discussed today:   Goals Addressed               This Visit's Progress     Patient Stated     Patient will obtain a stretcher transported visit to wound care center + out of county medicaid transportation as needed, decrease hospital visits [facility]) (pt-stated)   On track     Care Coordination Interventions: Interventions Today    Flowsheet Row Most Recent Value  Chronic Disease   Chronic disease during today's visit Other  [urology visits & transportation]  General Interventions   General Interventions Discussed/Reviewed --  Hanley Hays Mr Darin has decreased his hospitalizations with routine wound care and urology office visits]  Doctor Visits Discussed/Reviewed Doctor Visits Reviewed, Specialist  PCP/Specialist Visits Compliance with follow-up visit  [confirmed transportation resource called by patient for his 05/07/23 urology office visit + pending transportation for urology surgery date. Discussed the importance of the preferred date for scheduling surgery to coincide with return transportation]              Our next appointment is by telephone on 06/06/23 at 1115  Please call the care guide team at [phone] if you need to cancel or reschedule your appointment.   If you are experiencing a Mental Health or Behavioral Health Crisis or need someone to talk to, please call the Suicide and Crisis Lifeline: 988 call the [place] National Suicide Prevention Lifeline: 401 648 1438 or TTY: 917-156-7406 TTY 704-328-7668) to talk to a trained counselor call 1-800-273-TALK (toll free, 24 hour hotline) go to [facility] [address] [place] [phone]) call 911   Patient verbalizes understanding of instructions and care plan provided today and agrees to view in MyChart.  Active MyChart status and patient understanding of how to access instructions and care plan via MyChart confirmed with patient.     The patient has been provided with contact information for the care management team and has been advised to call with any health related questions or concerns.    Iridessa Harrow L. Noelle Penner, RN, BSN, CCM [facility] Care Management Community Coordinator Office number [phone]

## 2023-05-02 ENCOUNTER — Encounter: Payer: 59 | Admitting: *Deleted

## 2023-05-07 DIAGNOSIS — Z933 Colostomy status: Secondary | ICD-10-CM | POA: Diagnosis not present

## 2023-05-07 DIAGNOSIS — N319 Neuromuscular dysfunction of bladder, unspecified: Secondary | ICD-10-CM | POA: Diagnosis not present

## 2023-05-07 DIAGNOSIS — N360 Urethral fistula: Secondary | ICD-10-CM | POA: Diagnosis not present

## 2023-05-07 DIAGNOSIS — S21239A Puncture wound without foreign body of unspecified back wall of thorax without penetration into thoracic cavity, initial encounter: Secondary | ICD-10-CM | POA: Diagnosis not present

## 2023-05-07 DIAGNOSIS — Z960 Presence of urogenital implants: Secondary | ICD-10-CM | POA: Diagnosis not present

## 2023-05-07 DIAGNOSIS — R339 Retention of urine, unspecified: Secondary | ICD-10-CM | POA: Diagnosis not present

## 2023-05-16 DIAGNOSIS — G4733 Obstructive sleep apnea (adult) (pediatric): Secondary | ICD-10-CM | POA: Diagnosis not present

## 2023-05-24 DIAGNOSIS — Z7401 Bed confinement status: Secondary | ICD-10-CM | POA: Diagnosis not present

## 2023-05-24 DIAGNOSIS — L89314 Pressure ulcer of right buttock, stage 4: Secondary | ICD-10-CM | POA: Diagnosis not present

## 2023-05-24 DIAGNOSIS — R531 Weakness: Secondary | ICD-10-CM | POA: Diagnosis not present

## 2023-05-24 DIAGNOSIS — L89154 Pressure ulcer of sacral region, stage 4: Secondary | ICD-10-CM | POA: Diagnosis not present

## 2023-05-25 DIAGNOSIS — M545 Low back pain, unspecified: Secondary | ICD-10-CM | POA: Diagnosis not present

## 2023-05-25 DIAGNOSIS — Z79899 Other long term (current) drug therapy: Secondary | ICD-10-CM | POA: Diagnosis not present

## 2023-05-25 DIAGNOSIS — R030 Elevated blood-pressure reading, without diagnosis of hypertension: Secondary | ICD-10-CM | POA: Diagnosis not present

## 2023-05-25 DIAGNOSIS — G8221 Paraplegia, complete: Secondary | ICD-10-CM | POA: Diagnosis not present

## 2023-05-25 DIAGNOSIS — R300 Dysuria: Secondary | ICD-10-CM | POA: Diagnosis not present

## 2023-06-01 DIAGNOSIS — L89894 Pressure ulcer of other site, stage 4: Secondary | ICD-10-CM | POA: Diagnosis not present

## 2023-06-06 ENCOUNTER — Ambulatory Visit: Payer: Self-pay | Admitting: *Deleted

## 2023-06-06 NOTE — Patient Instructions (Addendum)
Visit Information  Thank you for taking time to visit with me today. Please don't hesitate to contact me if I can be of assistance to you.   Following are the goals we discussed today:   Goals Addressed               This Visit's Progress     Patient Stated     Patient will obtain a stretcher transported visit to wound care center + out of county medicaid transportation as needed, decrease hospital visits [facility]) (pt-stated)   On track      Patient will continue to attend his wound care, pain management and urology visits as scheduled with available transportation Patient will continue to have home aide complete sacral and bladder wound care- Noted wound healing during recent clinic visit   Care Coordination Interventions: Interventions Today    Flowsheet Row Most Recent Value  Chronic Disease   Chronic disease during today's visit Other  [pending upcoming urology surgery 06/12/23, wound healing, transportation]  General Interventions   General Interventions Discussed/Reviewed General Interventions Reviewed, Doctor Visits, [employer], Communication with  Doctor Visits Discussed/Reviewed Doctor Visits Reviewed, Specialist  PCP/Specialist Visits Compliance with follow-up visit  Communication with --  [name] to DSS to attempt to schedule his 06/20/23 transport to [facility] but DSS staff stated patient's medical assessment needs to be completed as it expired 06/02/23. updated patient]  Exercise Interventions   Exercise Discussed/Reviewed Physical Activity, Exercise Reviewed  Physical Activity Discussed/Reviewed Physical Activity Reviewed  Education Interventions   Education Provided Provided Education, Provided Web-based Education  [high protein foods]  Provided Verbal Education On Nutrition, Community Resources  Mental Health Interventions   Mental Health Discussed/Reviewed Mental Health Reviewed, Coping Strategies  Nutrition Interventions   Nutrition Discussed/Reviewed  Nutrition Reviewed, Increasing proteins  Pharmacy Interventions   Pharmacy Dicussed/Reviewed Pharmacy Topics Reviewed, Affording Medications              Our next appointment is by telephone on 06/20/23 at 215  Please call the care guide team at [phone] if you need to cancel or reschedule your appointment.   If you are experiencing a Mental Health or Behavioral Health Crisis or need someone to talk to, please call the Suicide and Crisis Lifeline: 988 call the [place] National Suicide Prevention Lifeline: (208) 378-3279 or TTY: 253-341-7928 TTY 774 524 5829) to talk to a trained counselor call 1-800-273-TALK (toll free, 24 hour hotline) call the [facility]: [phone] call 911   Patient verbalizes understanding of instructions and care plan provided today and agrees to view in MyChart. Active MyChart status and patient understanding of how to access instructions and care plan via MyChart confirmed with patient.     The patient has been provided with contact information for the care management team and has been advised to call with any health related questions or concerns.   Anwar Crill L. Noelle Penner, RN, BSN, CCM, Care Management Coordinator [phone]

## 2023-06-06 NOTE — Patient Outreach (Addendum)
  Care Coordination   Follow Up Visit Note   06/06/2023 Name: Erwin Rasheed MRN: [id number] DOB: 30-Sep-1966  Hikaru Denby is a 57 y.o. year old male who sees [name], [place] for primary care. I spoke with  [employer] buyer by phone today.  What matters to the patients health and wellness today?  Pending urology surgery 06/12/23 with medicaid transportation. needed Healing sacral wounds. Mr Forsberg reports Dr Nelda Severe, wound care provider stated his smallest sacral wound should be healed by October 2024 Mr Merrithew confirms this wound resolution is related to his diligent care from his aide [name] who keeps him repositioned every 2 hours, keeps the sites clean & dry plus his change in diet (high protein). He is very pleased with the positive progression of his health in 2024   He confirms his pain has decreased since the removal of an increased amount of urethral blood clots/hematuria in September 2023  Followed by Dr Jenne Pane at [facility] medical for pcp and pain management services   He agrees for RN CM to make attempts on 06/06/23 to schedule his out of county transportation to [place] for surgery on 06/12/23 and then he will make attempts on 06/07/23 as needed    Goals Addressed               This Visit's Progress     Patient Stated     Patient will obtain a stretcher transported visit to wound care center + out of county medicaid transportation as needed, decrease hospital visits [facility]) (pt-stated)   On track      Patient will continue to attend his wound care, pain management and urology visits as scheduled with available transportation Patient will continue to have home aide complete sacral and bladder wound care- Noted wound healing during recent clinic visit   Care Coordination Interventions: Interventions Today    Flowsheet Row Most Recent Value  Chronic Disease   Chronic disease during today's visit Other  [pending upcoming urology surgery 06/12/23, wound  healing, transportation]  General Interventions   General Interventions Discussed/Reviewed General Interventions Reviewed, Doctor Visits, [employer], Communication with  Doctor Visits Discussed/Reviewed Doctor Visits Reviewed, Specialist  PCP/Specialist Visits Compliance with follow-up visit  Communication with --  [name] to DSS to attempt to schedule his 06/20/23 transport to [facility] but DSS staff stated patient's medical assessment needs to be completed as it expired 06/02/23. updated patient]  Exercise Interventions   Exercise Discussed/Reviewed Physical Activity, Exercise Reviewed  Physical Activity Discussed/Reviewed Physical Activity Reviewed  Education Interventions   Education Provided Provided Education, Provided Web-based Education  [high protein foods]  Provided Verbal Education On Nutrition, Programmer, applications  Mental Health Interventions   Mental Health Discussed/Reviewed Mental Health Reviewed, Coping Strategies  Nutrition Interventions   Nutrition Discussed/Reviewed Nutrition Reviewed, Increasing proteins  Pharmacy Interventions   Pharmacy Dicussed/Reviewed Pharmacy Topics Reviewed, Affording Medications              SDOH assessments and interventions completed:  No     Care Coordination Interventions:  Yes, provided   Follow up plan: Follow up call scheduled for 06/20/23    Encounter Outcome:  Patient Visit Completed    Cala Bradford L. Noelle Penner, RN, BSN, CCM, Care Management Coordinator [phone]

## 2023-06-20 ENCOUNTER — Ambulatory Visit: Payer: Self-pay | Admitting: *Deleted

## 2023-06-20 DIAGNOSIS — K219 Gastro-esophageal reflux disease without esophagitis: Secondary | ICD-10-CM | POA: Diagnosis not present

## 2023-06-20 DIAGNOSIS — Z87828 Personal history of other (healed) physical injury and trauma: Secondary | ICD-10-CM | POA: Diagnosis not present

## 2023-06-20 DIAGNOSIS — N35812 Other urethral bulbous stricture, male: Secondary | ICD-10-CM | POA: Diagnosis not present

## 2023-06-20 DIAGNOSIS — Z79891 Long term (current) use of opiate analgesic: Secondary | ICD-10-CM | POA: Diagnosis not present

## 2023-06-20 DIAGNOSIS — G4733 Obstructive sleep apnea (adult) (pediatric): Secondary | ICD-10-CM | POA: Diagnosis not present

## 2023-06-20 DIAGNOSIS — Z933 Colostomy status: Secondary | ICD-10-CM | POA: Diagnosis not present

## 2023-06-20 DIAGNOSIS — N360 Urethral fistula: Secondary | ICD-10-CM | POA: Diagnosis not present

## 2023-06-20 DIAGNOSIS — Z87891 Personal history of nicotine dependence: Secondary | ICD-10-CM | POA: Diagnosis not present

## 2023-06-20 DIAGNOSIS — I10 Essential (primary) hypertension: Secondary | ICD-10-CM | POA: Diagnosis not present

## 2023-06-20 DIAGNOSIS — G8220 Paraplegia, unspecified: Secondary | ICD-10-CM | POA: Diagnosis not present

## 2023-06-20 DIAGNOSIS — Z91014 Allergy to mammalian meats: Secondary | ICD-10-CM | POA: Diagnosis not present

## 2023-06-20 DIAGNOSIS — Z993 Dependence on wheelchair: Secondary | ICD-10-CM | POA: Diagnosis not present

## 2023-06-20 DIAGNOSIS — G8929 Other chronic pain: Secondary | ICD-10-CM | POA: Diagnosis not present

## 2023-06-20 NOTE — Patient Outreach (Addendum)
Care Coordination   06/20/2023 Name: [name] MRN: [id number] DOB: 1966-06-05   Care Coordination Outreach Attempts:  An unsuccessful telephone outreach was attempted for a scheduled appointment today.  Follow Up Plan:  Additional outreach attempts will be made to offer the patient care coordination information and services.   Encounter Outcome:  No Answer  Patient noted in care everywhere to be at [facility] for Urethroplasty (lithotomy) (Midline) ADJACENT TRANSF CHIN/NECK/AX/FT; 10.1-30.0 SQ CM (N/A) CYSTOURETHROSCOPY (SEPARATE PROCEDURE) (N/A) ADJACENT TRANSF CHIN/NECK/AX/FT; 10 SQ CM/LESS (N/A) SCROTOPLASTY; COMPLICATED (N/A).   Care Coordination Interventions:  No, not indicated    Kaicen Desena L. Noelle Penner, RN, BSN, CCM, Care Management Coordinator [phone]

## 2023-06-21 DIAGNOSIS — Z87891 Personal history of nicotine dependence: Secondary | ICD-10-CM | POA: Diagnosis not present

## 2023-06-21 DIAGNOSIS — Z993 Dependence on wheelchair: Secondary | ICD-10-CM | POA: Diagnosis not present

## 2023-06-21 DIAGNOSIS — G8220 Paraplegia, unspecified: Secondary | ICD-10-CM | POA: Diagnosis not present

## 2023-06-21 DIAGNOSIS — G8929 Other chronic pain: Secondary | ICD-10-CM | POA: Diagnosis not present

## 2023-06-21 DIAGNOSIS — Z91014 Allergy to mammalian meats: Secondary | ICD-10-CM | POA: Diagnosis not present

## 2023-06-21 DIAGNOSIS — Z933 Colostomy status: Secondary | ICD-10-CM | POA: Diagnosis not present

## 2023-06-21 DIAGNOSIS — G4733 Obstructive sleep apnea (adult) (pediatric): Secondary | ICD-10-CM | POA: Diagnosis not present

## 2023-06-21 DIAGNOSIS — I10 Essential (primary) hypertension: Secondary | ICD-10-CM | POA: Diagnosis not present

## 2023-06-21 DIAGNOSIS — N360 Urethral fistula: Secondary | ICD-10-CM | POA: Diagnosis not present

## 2023-06-21 DIAGNOSIS — K219 Gastro-esophageal reflux disease without esophagitis: Secondary | ICD-10-CM | POA: Diagnosis not present

## 2023-06-21 DIAGNOSIS — Z87828 Personal history of other (healed) physical injury and trauma: Secondary | ICD-10-CM | POA: Diagnosis not present

## 2023-06-21 DIAGNOSIS — Z79891 Long term (current) use of opiate analgesic: Secondary | ICD-10-CM | POA: Diagnosis not present

## 2023-06-22 DIAGNOSIS — Z09 Encounter for follow-up examination after completed treatment for conditions other than malignant neoplasm: Secondary | ICD-10-CM | POA: Diagnosis not present

## 2023-06-22 DIAGNOSIS — G8221 Paraplegia, complete: Secondary | ICD-10-CM | POA: Diagnosis not present

## 2023-06-22 DIAGNOSIS — Z79899 Other long term (current) drug therapy: Secondary | ICD-10-CM | POA: Diagnosis not present

## 2023-06-22 DIAGNOSIS — R030 Elevated blood-pressure reading, without diagnosis of hypertension: Secondary | ICD-10-CM | POA: Diagnosis not present

## 2023-06-22 DIAGNOSIS — M545 Low back pain, unspecified: Secondary | ICD-10-CM | POA: Diagnosis not present

## 2023-06-28 DIAGNOSIS — Z79899 Other long term (current) drug therapy: Secondary | ICD-10-CM | POA: Diagnosis not present

## 2023-06-29 DIAGNOSIS — M545 Low back pain, unspecified: Secondary | ICD-10-CM | POA: Diagnosis not present

## 2023-06-29 DIAGNOSIS — G8221 Paraplegia, complete: Secondary | ICD-10-CM | POA: Diagnosis not present

## 2023-06-29 DIAGNOSIS — R300 Dysuria: Secondary | ICD-10-CM | POA: Diagnosis not present

## 2023-06-29 DIAGNOSIS — Z79899 Other long term (current) drug therapy: Secondary | ICD-10-CM | POA: Diagnosis not present

## 2023-07-02 ENCOUNTER — Ambulatory Visit: Payer: Self-pay | Admitting: *Deleted

## 2023-07-02 NOTE — Patient Instructions (Addendum)
Visit Information  Thank you for taking time to visit with me today. Please don't hesitate to contact me if I can be of assistance to you.   Following are the goals we discussed today:   Goals Addressed               This Visit's Progress     Patient Stated     Patient will obtain a stretcher transported visit to wound care center + out of county medicaid transportation as needed, decrease hospital visits [facility]) (pt-stated)   On track      Patient will continue to attend his wound care, pain management and urology visits as scheduled with available transportation Patient will continue to have home aide complete sacral and bladder wound care- Noted wound healing during recent clinic visit   Care Coordination Interventions: Interventions Today    Flowsheet Row Most Recent Value  Chronic Disease   Chronic disease during today's visit Other  [follow up after urology surgery]  General Interventions   General Interventions Discussed/Reviewed Communication with  Doctor Visits Discussed/Reviewed Specialist, Doctor Visits Reviewed  PCP/Specialist Visits Compliance with follow-up visit  Communication with PCP/Specialists  [outreach to [place] DSS non medical transportation [phone]]  Mental Health Interventions   Mental Health Discussed/Reviewed Mental Health Reviewed, Coping Strategies              Our next appointment is by telephone on 07/16/23 at 1030  Please call the care guide team at [phone] if you need to cancel or reschedule your appointment.   If you are experiencing a Mental Health or Behavioral Health Crisis or need someone to talk to, please call the Suicide and Crisis Lifeline: 988 call the [place] National Suicide Prevention Lifeline: 650-081-7859 or TTY: 8476617516 TTY 361-386-6543) to talk to a trained counselor call 1-800-273-TALK (toll free, 24 hour hotline) go to [facility] [address] [place]  [phone]) call 911   Patient verbalizes understanding of instructions and care plan provided today and agrees to view in MyChart. Active MyChart status and patient understanding of how to access instructions and care plan via MyChart confirmed with patient.     The patient has been provided with contact information for the care management team and has been advised to call with any health related questions or concerns.   Cyanne Delmar L. Noelle Penner, RN, BSN, CCM, Care Management Coordinator [phone]

## 2023-07-02 NOTE — Patient Outreach (Signed)
Care Coordination   Follow Up Visit Note   07/02/2023 Name: [name] MRN: [id number] DOB: 1965/11/14  [name] is a 57 y.o. year old male who sees [name], [place] for primary care. I spoke with  [name] by phone today.  What matters to the patients health and wellness today?  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 [facility] dr 3rd floor [place] [phone] Doing very well after recent surgery Surgery was not as complicated as anticipated All his wounds (penis & buttocks) are without drainage, pain, redness and swelling   He confirms he would prefer to reschedule with Dr Nelda Severe wound care clinic after he has healed from his recent surgery   Goals Addressed               This Visit's Progress     Patient Stated     Patient will obtain a stretcher transported visit to wound care center + out of county medicaid transportation as needed, decrease hospital visits [facility]) (pt-stated)   On track      Patient will continue to attend his wound care, pain management and urology visits as scheduled with available transportation Patient will continue to have home aide complete sacral and bladder wound care- Noted wound healing during recent clinic visit   Care Coordination Interventions: Interventions Today    Flowsheet Row Most Recent Value  Chronic Disease   Chronic disease during today's visit Other  [follow up after urology surgery]  General Interventions   General Interventions Discussed/Reviewed Communication with  Doctor Visits Discussed/Reviewed Specialist, Doctor Visits Reviewed  PCP/Specialist Visits Compliance with follow-up visit  Communication with PCP/Specialists  [outreach to [facility] DSS non medical transportation [phone]]  Mental Health Interventions   Mental Health Discussed/Reviewed Mental Health  Reviewed, Coping Strategies              SDOH assessments and interventions completed:  No     Care Coordination Interventions:  Yes, provided   Follow up plan: Follow up call scheduled for 07/16/23    Encounter Outcome:  Patient Visit Completed   Cala Bradford L. Noelle Penner, RN, BSN, CCM, Care Management Coordinator [phone]

## 2023-07-03 ENCOUNTER — Ambulatory Visit: Payer: Self-pay | Admitting: *Deleted

## 2023-07-03 NOTE — Patient Outreach (Signed)
Care Coordination   Follow Up Visit Note   07/04/2023 Name: [name] MRN: [id number] DOB: [birth year]  [name] is a 57 y.o. year old male who sees [name], [place] for primary care. I spoke with  [name] by phone today.  What matters to the patients health and wellness today?  Patient is needing a new hospital bed mattress, locks on his bed and to return oxygen supplies to [employer]  Searching the chart to found out patient received equipment from [employer] from August 2017 to January 2023 [employer] is not able to complete repairs to his bed as he received it more than 3 years ago (December 2021) [employer] can change his mattress but not bed locks. Patient will have to call the bed company for locks to be ordered and changed  Patient requesting [employer] to pick up his oxygen tank (2022). [name], [employer] staff suggestion is for the patient to return the oxygen items to the [employer] [employer] [place] store  All equipment at patient home is owned by the patient after 3 + years of rental  Low air lose mattress group 2 is the name of the mattress needed  Fax for [employer]  is [phone]  Kept patient updated.    Goals Addressed               This Visit's Progress     Patient Stated     Patient will obtain a stretcher transported visit to wound care [name] + out of county medicaid transportation as needed, decrease hospital visits [name]) (pt-stated)         Patient will continue to attend his wound care, pain management and urology visits as scheduled with available transportation Patient will continue to have home aide complete sacral and bladder wound care- Noted wound healing during recent clinic visit  Interventions Today    Flowsheet Row Most Recent Value  Chronic Disease   Chronic disease during today's visit Other  [hospital bed mattress, new bed locks/wheels (bed moves with transfers), ?bed pump]  General Interventions   General Interventions  Discussed/Reviewed General Interventions Reviewed, [facility], Communication with, Horticulturist, commercial (DME)  Durable Medical Equipment (DME) Other  [hospital bed/new parts, Return oxygen machine]  PCP/Specialist Visits --  [name] to [name] medical provider D Jenne [name] to request order/referral to [employer] fax [phone] for a low air loose mattress group 2 for sacral wound healing Spoke with [name] who sent message to [name] Medical assistant]  Communication with PCP/Specialists  [outreach to [employer] [phone] to speak with staff about replacing parts of his bed, oxygen return]              SDOH assessments and interventions completed:  No     Care Coordination Interventions:  Yes, provided   Follow up plan: Follow up call scheduled for 07/16/23    Encounter Outcome:  Patient Visit Completed   [name] Bradford L. Noelle Penner, RN, BSN, CCM, Care Management Coordinator [phone]

## 2023-07-04 ENCOUNTER — Ambulatory Visit: Payer: Self-pay | Admitting: *Deleted

## 2023-07-04 NOTE — Patient Outreach (Signed)
Care Coordination   Collaboration with [facility] DSS  Visit Note   07/04/2023 Name: [name] MRN: [id number] DOB: 1965/10/22  [name] is a 57 y.o. year old male who sees [name], [place] for primary care. I  spoke with Dell Ponto a [place] county DSS at [phone] and followed up with [employer] buyer, patient of Tama Headings PA  What matters to the patients health and wellness today?  Medicaid transportation to [place] Urology visit on 07/09/23 to Dr Ileene Rubens   RN CM called both [facility] DSS transportation with a long wait  Initially [place] requested patient's social security number. RN CM informed her RN CM was not privy to this number and offered his date of birth. Dell Ponto asked for the  patient's Medicaid number. After initially informing RN CM that [name] had "never booked any rides",  a request to speak with her "lead"/supervisor and re entry of [name] information plus checking that RN CM had called and left a message on 07/02/23 (within the required time frame to process the transportation. She was provided RN CM name, Telephone number, the time RN CM called on 07/02/23, how many attempt made and DSS number called.  Dell Ponto completed the transportation entry at [phone] 07/04/23 & informed RN CM that her note was the confirmation of the patient's ride.  [name] was updated. He voiced appreciation for services rendered. He had called also on 07/02/23 and on 07/03/23. On 07/03/23 when he was able to get staff member on the line, he states he was informed he was too late to schedule a ride to his 07/09/23 appointment    Goals Addressed               This Visit's Progress     Patient Stated     Patient will obtain a stretcher transported visit to wound care center + out of county medicaid transportation as needed, decrease hospital visits [facility]) (pt-stated)   On track      Patient will continue to attend his wound care, pain management and  urology visits as scheduled with available transportation Patient will continue to have home aide complete sacral and bladder wound care- Noted wound healing during recent clinic visit  Interventions Today    Flowsheet Row Most Recent Value  Chronic Disease   Chronic disease during today's visit Other  [appointment transportation to 07/09/23 to see Dr Ileene Rubens  General Interventions   General Interventions Discussed/Reviewed General Interventions Reviewed, Communication with, [employer], Doctor Visits  [Provided [place] with the info to schedule pt's appointment. inquired about confirmation of this entry. [name] was updated]  Doctor Visits Discussed/Reviewed Doctor Visits Reviewed, Specialist  PCP/Specialist Visits Compliance with follow-up visit  Communication with PCP/Specialists  [name] to [facility] DSS [phone] & [phone] to find out the status of the message left on 07/02/23 requesting patient transportation after calling and waiting extensive times. RN CM provided all info Lenora needed to find the message]              SDOH assessments and interventions completed:  No     Care Coordination Interventions:  Yes, provided   Follow up plan: Follow up call scheduled for 07/16/23    Encounter Outcome:  Patient Visit Completed   Cala Bradford L. Noelle Penner, RN, BSN, CCM, Care Management Coordinator [phone]

## 2023-07-04 NOTE — Patient Instructions (Signed)
Visit Information  Thank you for taking time to visit with me today. Please don't hesitate to contact me if I can be of assistance to you.   Following are the goals we discussed today:   Goals Addressed               This Visit's Progress     Patient Stated     Patient will obtain a stretcher transported visit to wound care center + out of county medicaid transportation as needed, decrease hospital visits [facility]) (pt-stated)         Patient will continue to attend his wound care, pain management and urology visits as scheduled with available transportation Patient will continue to have home aide complete sacral and bladder wound care- Noted wound healing during recent clinic visit  Interventions Today    Flowsheet Row Most Recent Value  Chronic Disease   Chronic disease during today's visit Other  [hospital bed mattress, new bed locks/wheels (bed moves with transfers), ?bed pump]  General Interventions   General Interventions Discussed/Reviewed General Interventions Reviewed, [facility], Communication with, Horticulturist, commercial (DME)  Durable Medical Equipment (DME) Other  [hospital bed/new parts, Return oxygen machine]  PCP/Specialist Visits --  [name] to [place] medical provider D Jenne Pane to request order/referral to [employer] fax [phone] for a low air loose mattress group 2 for sacral wound healing Spoke with angela who sent message to D [facility] Medical assistant]  Communication with PCP/Specialists  [outreach to [employer] [facility] [phone] to speak with staff about replacing parts of his bed, oxygen return]              Our next appointment is by telephone on 07/16/23 at 1030  Please call the care guide team at [phone] if you need to cancel or reschedule your appointment.   If you are experiencing a Mental Health or Behavioral Health Crisis or need someone to talk to, please call the Suicide and Crisis Lifeline: 988 call the [place] National Suicide  Prevention Lifeline: 613 507 5691 or TTY: (615) 337-4851 TTY 631-477-6651) to talk to a trained counselor call 1-800-273-TALK (toll free, 24 hour hotline) call the [facility]: [phone] call 911   Patient verbalizes understanding of instructions and care plan provided today and agrees to view in MyChart. Active MyChart status and patient understanding of how to access instructions and care plan via MyChart confirmed with patient.     The patient has been provided with contact information for the care management team and has been advised to call with any health related questions or concerns.   Bradden Tadros L. Noelle Penner, RN, BSN, CCM, Care Management Coordinator [phone]

## 2023-07-04 NOTE — Patient Instructions (Signed)
Visit Information  Thank you for taking time to visit with me today. Please don't hesitate to contact me if I can be of assistance to you.   Following are the goals we discussed today:   Goals Addressed               This Visit's Progress     Patient Stated     Patient will obtain a stretcher transported visit to wound care center + out of county medicaid transportation as needed, decrease hospital visits [facility]) (pt-stated)   On track      Patient will continue to attend his wound care, pain management and urology visits as scheduled with available transportation Patient will continue to have home aide complete sacral and bladder wound care- Noted wound healing during recent clinic visit  Interventions Today    Flowsheet Row Most Recent Value  Chronic Disease   Chronic disease during today's visit Other  [appointment transportation to 07/09/23 to see Dr Ileene Rubens  General Interventions   General Interventions Discussed/Reviewed General Interventions Reviewed, Communication with, [facility], Doctor Visits  [Provided [place] with the info to schedule pt's appointment. inquired about confirmation of this entry. Mr Caraher was updated]  Doctor Visits Discussed/Reviewed Doctor Visits Reviewed, Specialist  PCP/Specialist Visits Compliance with follow-up visit  Communication with PCP/Specialists  [name] to [facility] DSS [phone] & [phone] to find out the status of the message left on 07/02/23 requesting patient transportation after calling and waiting extensive times. RN CM provided all info Dell Ponto needed to find the message]              Our next appointment is by telephone on 07/16/23 at 1030  Please call the care guide team at [phone] if you need to cancel or reschedule your appointment.   If you are experiencing a Mental Health or Behavioral Health Crisis or need someone to talk to, please call the Suicide and Crisis Lifeline: 988 call the [place]  National Suicide Prevention Lifeline: 628-418-1213 or TTY: 586-068-5617 TTY 858-655-8204) to talk to a trained counselor call 1-800-273-TALK (toll free, 24 hour hotline) call the [facility]: 856-790-5409 call 911   Patient verbalizes understanding of instructions and care plan provided today and agrees to view in MyChart. Active MyChart status and patient understanding of how to access instructions and care plan via MyChart confirmed with patient.     The patient has been provided with contact information for the care management team and has been advised to call with any health related questions or concerns.   Lonza Shimabukuro L. Noelle Penner, RN, BSN, CCM, Care Management Coordinator [phone]

## 2023-07-06 ENCOUNTER — Ambulatory Visit: Payer: Self-pay | Admitting: *Deleted

## 2023-07-06 NOTE — Patient Instructions (Signed)
Visit Information  Thank you for taking time to visit with me today. Please don't hesitate to contact me if I can be of assistance to you.   Following are the goals we discussed today:   Goals Addressed               This Visit's Progress     Patient Stated     Patient will obtain a stretcher transported visit to wound care center + out of county medicaid transportation as needed, decrease hospital visits [facility]) (pt-stated)   Not on track      Patient will continue to attend his wound care, pain management and urology visits as scheduled with available transportation Patient will continue to have home aide complete sacral and bladder wound care- Noted wound healing during recent clinic visit  Interventions Today    Flowsheet Row Most Recent Value  Chronic Disease   Chronic disease during today's visit Other  [transportation to 07/09/23 out of county appoiontment Patient updated RN CM he did not receive a call from his Katherina Right confirming his ride to [employer] [place]. RN CM spent 35 minutes waiting for [place] DSS [phone] to answer. unsuccessful,]  General Interventions   General Interventions Discussed/Reviewed General Interventions Reviewed, [facility], Doctor Visits, Communication with  [patient updated. call attempt [employer] Voice message left at [phone]. spoke with Tamika @[email or web address] - not scheduled with them. ?pending MD form. call to [place] health care. Lesly Rubenstein of [place] assisted with scheduling transportation]  Doctor Visits Discussed/Reviewed Specialist  [dr figlar [facility] urology [phone] no answer, unable to leave a medicine to updated MD of possible concerns with  Taken]  PCP/Specialist Visits Compliance with follow-up visit  Communication with PCP/Specialists  Education Interventions   Education Provided Provided Education  [use of DSS medicaid, [employer], [employer]]  Provided Verbal Education On --  [Jade provided [phone] to  call on Sunday if a confirmation call isn't reeived. Conf# [id number], [id number] He has 46 one way rides left [employer] stated a wheelchair accessible Zenaida Niece will arrive]  Mental Health Interventions   Mental Health Discussed/Reviewed Coping Strategies              Our next appointment is by telephone on 07/16/23 at 1030  Please call the care guide team at [phone] if you need to cancel or reschedule your appointment.   If you are experiencing a Mental Health or Behavioral Health Crisis or need someone to talk to, please call the Suicide and Crisis Lifeline: 988 call the [place] National Suicide Prevention Lifeline: 613-037-7614 or TTY: 6151281731 TTY 647-673-6307) to talk to a trained counselor call 1-800-273-TALK (toll free, 24 hour hotline) go to [facility] [address] [place] [phone]) call 911   Patient verbalizes understanding of instructions and care plan provided today and agrees to view in MyChart. Active MyChart status and patient understanding of how to access instructions and care plan via MyChart confirmed with patient.     The patient has been provided with contact information for the care management team and has been advised to call with any health related questions or concerns.   Jousha Schwandt L. Noelle Penner, RN, BSN, [facility]  VBCI Care Management Coordinator  [phone]  Fax: [phone]

## 2023-07-06 NOTE — Patient Outreach (Signed)
Care Coordination   Follow Up Visit Note   07/06/2023 Name: [name] MRN: [id number] DOB: 12-26-1965  [name] is a 57 y.o. year old male who sees [name], [place] for primary care. I spoke with  [employer] buyer by phone today.  What matters to the patients health and wellness today?  Transportation to 07/09/23 urology visit    Goals Addressed               This Visit's Progress     Patient Stated     Patient will obtain a stretcher transported visit to wound care center + out of county medicaid transportation as needed, decrease hospital visits [facility]) (pt-stated)   Not on track      Patient will continue to attend his wound care, pain management and urology visits as scheduled with available transportation Patient will continue to have home aide complete sacral and bladder wound care- Noted wound healing during recent clinic visit  Interventions Today    Flowsheet Row Most Recent Value  Chronic Disease   Chronic disease during today's visit Other  [transportation to 07/09/23 out of county appoiontment Patient updated RN CM he did not receive a call from his Katherina Right confirming his ride to [facility]. RN CM spent 35 minutes waiting for [place] DSS [phone] to answer. unsuccessful,]  General Interventions   General Interventions Discussed/Reviewed General Interventions Reviewed, [facility], Doctor Visits, Communication with  [patient updated. call attempt [employer] Voice message left at [phone]. spoke with Tamika @[employer] - not scheduled with them. ?pending MD form. call to [place] health care. Lesly Rubenstein of [place] assisted with scheduling transportation]  Doctor Visits Discussed/Reviewed Specialist  [dr figlar [facility] urology [phone] no answer, unable to leave a medicine to updated MD of possible concerns with  Taken]  PCP/Specialist Visits Compliance with follow-up visit  Communication with PCP/Specialists  Education  Interventions   Education Provided Provided Education  [use of DSS medicaid, [employer], [employer]]  Provided Verbal Education On --  [Jade provided [phone] to call on Sunday if a confirmation call isn't reeived. Conf# [id number], [id number] He has 46 one way rides left [employer] stated a wheelchair accessible Zenaida Niece will arrive]  Mental Health Interventions   Mental Health Discussed/Reviewed Coping Strategies              SDOH assessments and interventions completed:  No     Care Coordination Interventions:  Yes, provided   Follow up plan: Follow up call scheduled for 07/16/23    Encounter Outcome:  Patient Visit Completed   Cala Bradford L. Noelle Penner, RN, BSN, [facility]  VBCI Care Management Coordinator  [phone]  Fax: [phone]

## 2023-07-16 ENCOUNTER — Ambulatory Visit: Payer: Self-pay | Admitting: *Deleted

## 2023-07-16 NOTE — Patient Outreach (Signed)
Care Coordination   Follow Up Visit Note   07/16/2023 Name: [name] MRN: [id number] DOB: 17-Mar-1966  [name] is a 57 y.o. year old male who sees [name], [place] for primary care. I spoke with  [employer] buyer by phone today.  What matters to the patients health and wellness today?  Follow up on 07/09/23 visit to Dr Ileene Rubens. He confirms [place] healthcare did arrive to assist with transportation   Schedule the 07/23/23 0915 transportation with [place] healthcare benefit  [facility] [employer] one way rides left Aware of the need to call [employer] 3 days prior to appointments    07/27/23 0915 appointment to [facility] for pain management  Patient will schedule this ride independently with [employer] as he recalls the representative stating another number other than [phone]     Goals Addressed               This Visit's Progress     Patient Stated     Patient will obtain a stretcher transported visit to wound care center + out of county medicaid transportation as needed, decrease hospital visits [facility]) (pt-stated)   On track      Patient will continue to attend his wound care, pain management and urology visits as scheduled with available transportation Patient will continue to have home aide complete sacral and bladder wound care- Noted wound healing during recent clinic visit  Interventions Today    Flowsheet Row Most Recent Value  Chronic Disease   Chronic disease during today's visit Other  General Interventions   General Interventions Discussed/Reviewed General Interventions Reviewed, Community Resources  Doctor Visits Discussed/Reviewed Doctor Visits Reviewed, Specialist  Communication with PCP/Specialists  [[employer] transportation]  Education Interventions   Education Provided Provided Education  [discussed the importance of calling [employer] 3 business days prior to appointment dates. confirmed this with the representative]               SDOH assessments and interventions completed:  Yes     Care Coordination Interventions:  Yes, provided   Follow up plan: Follow up call scheduled for 08/03/23    Encounter Outcome:  Patient Visit Completed

## 2023-07-16 NOTE — Patient Instructions (Addendum)
Visit Information  Thank you for taking time to visit with me today. Please don't hesitate to contact me if I can be of assistance to you.   Following are the goals we discussed today:   Goals Addressed               This Visit's Progress     Patient Stated     Patient will obtain a stretcher transported visit to wound care center + out of county medicaid transportation as needed, decrease hospital visits [facility]) (pt-stated)   On track      Patient will continue to attend his wound care, pain management and urology visits as scheduled with available transportation Patient will continue to have home aide complete sacral and bladder wound care- Noted wound healing during recent clinic visit  Interventions Today    Flowsheet Row Most Recent Value  Chronic Disease   Chronic disease during today's visit Other  General Interventions   General Interventions Discussed/Reviewed General Interventions Reviewed, Community Resources  Doctor Visits Discussed/Reviewed Doctor Visits Reviewed, Specialist  Communication with PCP/Specialists  [[employer] transportation]  Education Interventions   Education Provided Provided Education  [discussed the importance of calling [employer] 3 business days prior to appointment dates. confirmed this with the representative]              Our next appointment is by telephone on 08/03/23 at 1115  Please call the care guide team at [phone] if you need to cancel or reschedule your appointment.   If you are experiencing a Mental Health or Behavioral Health Crisis or need someone to talk to, please call the Suicide and Crisis Lifeline: 988 call the [place] National Suicide Prevention Lifeline: 639-833-8818 or TTY: 8577010664 TTY 478-091-5559) to talk to a trained counselor call 1-800-273-TALK (toll free, 24 hour hotline) call the [facility]: [phone] call 911   Patient verbalizes understanding of instructions and care plan  provided today and agrees to view in MyChart. Active MyChart status and patient understanding of how to access instructions and care plan via MyChart confirmed with patient.     The patient has been provided with contact information for the care management team and has been advised to call with any health related questions or concerns.    Beata Beason L. Noelle Penner, RN, BSN, [facility]  VBCI Care Management Coordinator  [phone]  Fax: [phone]

## 2023-07-18 DIAGNOSIS — L89894 Pressure ulcer of other site, stage 4: Secondary | ICD-10-CM | POA: Diagnosis not present

## 2023-07-19 DIAGNOSIS — L89314 Pressure ulcer of right buttock, stage 4: Secondary | ICD-10-CM | POA: Diagnosis not present

## 2023-07-19 DIAGNOSIS — Z7401 Bed confinement status: Secondary | ICD-10-CM | POA: Diagnosis not present

## 2023-07-19 DIAGNOSIS — G8220 Paraplegia, unspecified: Secondary | ICD-10-CM | POA: Diagnosis not present

## 2023-07-19 DIAGNOSIS — Z933 Colostomy status: Secondary | ICD-10-CM | POA: Diagnosis not present

## 2023-07-19 DIAGNOSIS — G8929 Other chronic pain: Secondary | ICD-10-CM | POA: Diagnosis not present

## 2023-07-23 DIAGNOSIS — Z09 Encounter for follow-up examination after completed treatment for conditions other than malignant neoplasm: Secondary | ICD-10-CM | POA: Diagnosis not present

## 2023-07-23 DIAGNOSIS — N360 Urethral fistula: Secondary | ICD-10-CM | POA: Diagnosis not present

## 2023-07-26 ENCOUNTER — Ambulatory Visit: Payer: Self-pay | Admitting: *Deleted

## 2023-07-26 NOTE — Patient Instructions (Addendum)
Visit Information  Thank you for taking time to visit with me today. Please don't hesitate to contact me if I can be of assistance to you.   Following are the goals we discussed today:   Goals Addressed               This Visit's Progress     Patient Stated     Patient will obtain a stretcher transported visit to wound care center + out of county medicaid transportation as needed, decrease hospital visits [facility]) (pt-stated)   On track      Patient will continue to make attempts to get needed transportation to attend his wound care, pain management and urology visits as scheduled with available transportation- Updated He has not missed an appointment since ones that had to be rescheduled while inpatient for urology surgery (not in his control) - those were rescheduled- MEETING GOAL   Patient will continue to have home aide complete sacral and bladder wound care- Noted wound healing during recent clinic visit- MEETING GOAL  Patient will receive a mattress for his hospital bed   Interventions Today    Flowsheet Row Most Recent Value  Chronic Disease   Chronic disease during today's visit Other  [transportation to pain management clinic verification, list of in network [facility] dentists locally emailed to patient]  General Interventions   General Interventions Discussed/Reviewed Communication with  Doctor Visits Discussed/Reviewed Doctor Visits Reviewed, Specialist  Health Screening --  [mailed to [place] a list of in Academic librarian for [facility] dual complete coverage]  PCP/Specialist Visits Compliance with follow-up visit  Communication with PCP/Specialists  [collaboration with Engineer, drilling (fax ATTN [phone]) & CMA to assist with fax clinicals from wound care MD & urologist( 05/24/23, 07/23/23) to support patient need of new mattress on hospital bed]  Education Interventions   Education Provided Provided Education  Provided Verbal Education On [employer] he had  made his 07/27/23 transportation with pain management clinic. He attemtped to make the next wound care transportation but confirms it is too early RN to outreach on 08/03/23 to assist. Provided a list of dentists and emailed He will call]       Interventions Today    Flowsheet Row Most Recent Value  Chronic Disease   Chronic disease during today's visit Other  [transportation to pain management clinic verification, list of in network [facility] dentists locally emailed to patient]  General Interventions   General Interventions Discussed/Reviewed Communication with  Doctor Visits Discussed/Reviewed Doctor Visits Reviewed, Specialist  Health Screening --  [mailed to Schon a list of in Academic librarian for [facility] dual complete coverage]  PCP/Specialist Visits Compliance with follow-up visit  Communication with PCP/Specialists  [collaboration with Engineer, [employer] (fax ATTN [phone]) & CMA to assist with fax clinicals from wound care MD & urologist( 05/24/23, 07/23/23) to support patient need of new mattress on hospital bed]  Education Interventions   Education Provided Provided Education  Provided Verbal Education On [employer] he had made his 07/27/23 transportation with pain management clinic. He attemtped to make the next wound care transportation but confirms it is too early RN to outreach on 08/03/23 to assist. Provided a list of dentists and emailed He will call]              Our next appointment is by telephone on 08/03/23 at 1115  Please call the care guide team at [phone] if you need to cancel or reschedule  your appointment.   If you are experiencing a Mental Health or Behavioral Health Crisis or need someone to talk to, please call the Suicide and Crisis Lifeline: 988 call the [place] National Suicide Prevention Lifeline: 401-674-6241 or TTY: (740)353-6672 TTY (671) 643-4090) to talk to a trained counselor call 1-800-273-TALK (toll free, 24 hour hotline) go  to [facility] [address], [place] [phone]) call 911   Patient verbalizes understanding of instructions and care plan provided today and agrees to view in MyChart. Active MyChart status and patient understanding of how to access instructions and care plan via MyChart confirmed with patient.     The patient has been provided with contact information for the care management team and has been advised to call with any health related questions or concerns.   Samyia Motter L. Noelle Penner, RN, BSN, [facility]  VBCI Care Management Coordinator  [phone]  Fax: [phone]

## 2023-07-26 NOTE — Patient Outreach (Addendum)
Care Coordination   Follow Up Visit Note   07/26/2023 Name: [name] MRN: [id number] DOB: [birth year]  [name] is a 57 y.o. year old male who sees [name], [place] for primary care. I spoke with  [name] by phone today.  What matters to the patients health and wellness today?  Going to [employer] & pain management clinics on 07/27/23 and has confirmed his transportation  dentist is needed He agreed to have a list of in network [employer] dentists email him so he can schedule a visit He has not been to a dentist in awhile- has generally had good dentition but questions a cavity  He is able to recline his wheel chair back to accommodate dental and ophthalmology offices for treatment Plus will have his aide in attendance    Goals Addressed               This Visit's Progress     Patient Stated     Patient will obtain a stretcher transported visit to wound care center + out of county medicaid transportation as needed, decrease hospital visits [facility]) (pt-stated)   On track      Patient will continue to make attempts to get needed transportation to attend his wound care, pain management and urology visits as scheduled with available transportation- Updated He has not missed an appointment since ones that had to be rescheduled while inpatient for urology surgery (not in his control) - those were rescheduled- MEETING GOAL   Patient will continue to have home aide complete sacral and bladder wound care- Noted wound healing during recent clinic visit- MEETING GOAL  Patient will receive a mattress for his hospital bed   Interventions Today    Flowsheet Row Most Recent Value  Chronic Disease   Chronic disease during today's visit Other  [transportation to pain management clinic verification, list of in network [facility] dentists locally emailed to patient]  General Interventions   General Interventions Discussed/Reviewed Communication with  Doctor Visits  Discussed/Reviewed Doctor Visits Reviewed, Specialist  Health Screening --  [mailed to [name] a list of in Academic librarian for [facility] dual complete coverage]  PCP/Specialist Visits Compliance with follow-up visit  Communication with PCP/Specialists  [collaboration with Engineer, [employer] (fax ATTN [phone]) & CMA to assist with fax clinicals from wound care MD & urologist( 05/24/23, 07/23/23) to support patient need of new mattress on hospital bed]  Education Interventions   Education Provided Provided Education  Provided Verbal Education On [employer] he had made his 07/27/23 transportation with pain management clinic. He attemtped to make the next wound care transportation but confirms it is too early RN to outreach on 08/03/23 to assist. Provided a list of dentists and emailed He will call]       Interventions Today    Flowsheet Row Most Recent Value  Chronic Disease   Chronic disease during today's visit Other  [transportation to pain management clinic verification, list of in network [facility] dentists locally emailed to patient]  General Interventions   General Interventions Discussed/Reviewed Communication with  Doctor Visits Discussed/Reviewed Doctor Visits Reviewed, Specialist  Health Screening --  [mailed to [name] a list of in Academic librarian for [facility] dual complete coverage]  PCP/Specialist Visits Compliance with follow-up visit  Communication with PCP/Specialists  [collaboration with Engineer, drilling (fax ATTN [phone]) & CMA to assist with fax clinicals from wound care MD & urologist( 05/24/23, 07/23/23) to support patient need of  new mattress on hospital bed]  Education Interventions   Education Provided Provided Education  Provided Verbal Education On [employer] he had made his 07/27/23 transportation with pain management clinic. He attemtped to make the next wound care transportation but confirms it is too early RN to outreach on  08/03/23 to assist. Provided a list of dentists and emailed He will call]              SDOH assessments and interventions completed:  No     Care Coordination Interventions:  Yes, provided   Follow up plan: Follow up call scheduled for 08/03/23    Encounter Outcome:  Patient Visit Completed   [name] Bradford L. Noelle Penner, RN, BSN, [facility]  VBCI Care Management Coordinator  [phone]  Fax: [phone]

## 2023-07-27 ENCOUNTER — Ambulatory Visit: Payer: Self-pay | Admitting: *Deleted

## 2023-07-27 DIAGNOSIS — Z79899 Other long term (current) drug therapy: Secondary | ICD-10-CM | POA: Diagnosis not present

## 2023-07-27 DIAGNOSIS — G8221 Paraplegia, complete: Secondary | ICD-10-CM | POA: Diagnosis not present

## 2023-07-27 DIAGNOSIS — M545 Low back pain, unspecified: Secondary | ICD-10-CM | POA: Diagnosis not present

## 2023-07-27 DIAGNOSIS — H2512 Age-related nuclear cataract, left eye: Secondary | ICD-10-CM | POA: Diagnosis not present

## 2023-07-27 DIAGNOSIS — R300 Dysuria: Secondary | ICD-10-CM | POA: Diagnosis not present

## 2023-07-27 DIAGNOSIS — R7302 Impaired glucose tolerance (oral): Secondary | ICD-10-CM | POA: Diagnosis not present

## 2023-07-27 DIAGNOSIS — H524 Presbyopia: Secondary | ICD-10-CM | POA: Diagnosis not present

## 2023-07-27 DIAGNOSIS — H5202 Hypermetropia, left eye: Secondary | ICD-10-CM | POA: Diagnosis not present

## 2023-07-27 NOTE — Patient Outreach (Signed)
Care Coordination   Follow Up Visit Note   07/27/2023 Name: [name] MRN: [id number] DOB: March 20, 1966  [name] is a 57 y.o. year old male who sees [name], [place] for primary care. I spoke with  [name] by phone today.  What matters to the patients health and wellness today?  Leaking of urostomy site, transportation to get to [facility] near his urologist Dr [name]  Feeling pain, pressure throughout the day while going to 2 Dr appointments "It kept feeling like I had to pee" By the time he arrived home he was "soaking wet"  His aide helped him get dry. 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   Discussed transportation options - Medicaid & UHC safe ride require scheduling 3 days prior to a scheduled appointment. He nor anyone he knows has a wheelchair accessible vehicle so he can go in his motorized wheelchair.   Conference Outreach to [employer] provides stretcher non emergency & emergency transport but he would need united healthcare authorization to go out of county. Medicare guidelines states he need to go to the Hospital that is closer [facility])  [name] confirms an authorization from united healthcare is needed for this 40.8 mile trip. They are willing to assist and pend a return call from patient  Conference Outreach to [place] [employer] [phone] with attempt for authorization Spoke with staff who provided ambulance benefit information and transferred to provider pre-authorization Ambulance stretcher services preferred but united healthcare will not authorize the trip for 07/27/23 per the male who transferred to safe ride   Conference call- Safe ride had not availability to assist 07/27/23 but provided a scheduled ride for Wednesday 0805 with confirmation arrival #[id number], departure #[id number].  [name]  spoke with [facility] Provider during the pre authorization interaction. No CPT code provided by MD.  [name] states Dr [name] is to return a call to him, Wants him at [facility] ED and he will discussed the out of county transportation issue.  He confirms he will stay in touch with Dr [name] or his on call staff  Conference Outreach to [facility] connected with [facility] transport in [place] . Discussed [name] transportation concerns, Options provided to [name] and [name] to consult with her director and return a call to [name]. Cost per hour.  [name] voiced understanding of remaining options 1) [facility] out of pocket 2) safe ride on Wednesday 3) Seek EMS to [facility] if medical issue worsens      Goals Addressed               This Visit's Progress     Patient Stated     Patient will obtain a stretcher transported visit to wound care center + out of county medicaid transportation as needed, decrease hospital visits [facility]) (pt-stated)   Not on track      Patient will continue to make attempts to get needed transportation to attend his wound care, pain management and urology visits as scheduled with available transportation- Updated He has not missed an appointment since ones that had to be rescheduled while inpatient for urology surgery (not in his control) - those were rescheduled- MEETING GOAL   Patient will continue to have home aide complete sacral and bladder wound care- Noted wound healing during recent clinic visit- MEETING GOAL  Patient will receive  a mattress for his hospital bed   Interventions Today    Flowsheet Row Most Recent Value  Chronic Disease   Chronic disease during today's visit Other  [Leaking of urostomy site, transportation to get to [facility] near his urologist Dr [name]  General Interventions   General Interventions Discussed/Reviewed General Interventions Reviewed, [employer], Doctor Visits, Communication with, Sick Day Rules  Doctor  Visits Discussed/Reviewed --  [Discussed transportation options - Medicaid & UHC safe ride require scheduling 3 days prior to a scheduled appointment. He nor anyone he knows has a wheelchair accessible vehicle so he can go in his motorized wheelchair. + PTAR,]  PCP/Specialist Visits Compliance with follow-up visit  Communication with --  [conference calls with [name] to [employer], UHC benefits, UHC proivide pre authorization, UHC safe ride, Gentle care services]  Education Interventions   Education Provided Provided Education  [gentle care, medicare guidelines for scheduling transportation , oncall weekend urology services]  Provided Verbal Education On Mental Health/Coping with Illness, When to see the doctor, [employer], Development worker, community  Mental Health Interventions   Mental Health Discussed/Reviewed Mental Health Reviewed, Coping Strategies, Other  [support & encouragement provided]  Safety Interventions   Safety Discussed/Reviewed Safety Reviewed, Fall Risk  [Risk of being taken to facility via car]       Interventions Today    Flowsheet Row Most Recent Value  Chronic Disease   Chronic disease during today's visit Other  [Leaking of urostomy site, transportation to get to [facility] near his urologist Dr [name]  General Interventions   General Interventions Discussed/Reviewed General Interventions Reviewed, [employer], Doctor Visits, Communication with, Sick Day Rules  Doctor Visits Discussed/Reviewed --  [Discussed transportation options - Medicaid & UHC safe ride require scheduling 3 days prior to a scheduled appointment. He nor anyone he knows has a wheelchair accessible vehicle so he can go in his motorized wheelchair. + PTAR,]  PCP/Specialist Visits Compliance with follow-up visit  Communication with --  [conference calls with [name] to [employer], UHC benefits, UHC proivide pre authorization, UHC safe ride, Gentle care services]  Education Interventions   Education  Provided Provided Education  [gentle care, medicare guidelines for scheduling transportation , oncall weekend urology services]  Provided Verbal Education On Mental Health/Coping with Illness, When to see the doctor, [employer], Development worker, community  Mental Health Interventions   Mental Health Discussed/Reviewed Mental Health Reviewed, Coping Strategies, Other  [support & encouragement provided]  Safety Interventions   Safety Discussed/Reviewed Safety Reviewed, Fall Risk  [Risk of being taken to facility via car]              SDOH assessments and interventions completed:  No     Care Coordination Interventions:  Yes, provided   Follow up plan: Follow up call scheduled for 07/30/23 2:30 pm     Encounter Outcome:  Patient Visit Completed   Jerin Franzel L. Noelle Penner, RN, BSN, [facility]  VBCI Care Management Coordinator  [phone]  Fax: [phone]

## 2023-07-27 NOTE — Patient Instructions (Signed)
Visit Information  Thank you for taking time to visit with me today. Please don't hesitate to contact me if I can be of assistance to you.   Following are the goals we discussed today:   Goals Addressed               This Visit's Progress     Patient Stated     Patient will obtain a stretcher transported visit to wound care center + out of county medicaid transportation as needed, decrease hospital visits [facility]) (pt-stated)   Not on track      Patient will continue to make attempts to get needed transportation to attend his wound care, pain management and urology visits as scheduled with available transportation- Updated He has not missed an appointment since ones that had to be rescheduled while inpatient for urology surgery (not in his control) - those were rescheduled- MEETING GOAL   Patient will continue to have home aide complete sacral and bladder wound care- Noted wound healing during recent clinic visit- MEETING GOAL  Patient will receive a mattress for his hospital bed   Interventions Today    Flowsheet Row Most Recent Value  Chronic Disease   Chronic disease during today's visit Other  [Leaking of urostomy site, transportation to get to [facility] near his urologist Dr [name]  General Interventions   General Interventions Discussed/Reviewed General Interventions Reviewed, [employer], Doctor Visits, Communication with, Sick Day Rules  Doctor Visits Discussed/Reviewed --  [Discussed transportation options - Medicaid & UHC safe ride require scheduling 3 days prior to a scheduled appointment. He nor anyone he knows has a wheelchair accessible vehicle so he can go in his motorized wheelchair. + PTAR,]  PCP/Specialist Visits Compliance with follow-up visit  Communication with --  [conference calls with Armond to [employer], UHC benefits, UHC proivide pre authorization, UHC safe ride, Gentle care services]  Education Interventions   Education Provided Provided  Education  [gentle care, medicare guidelines for scheduling transportation , oncall weekend urology services]  Provided Verbal Education On Mental Health/Coping with Illness, When to see the doctor, [employer], Development worker, community  Mental Health Interventions   Mental Health Discussed/Reviewed Mental Health Reviewed, Coping Strategies, Other  [support & encouragement provided]  Safety Interventions   Safety Discussed/Reviewed Safety Reviewed, Fall Risk  [Risk of being taken to facility via car]       Interventions Today    Flowsheet Row Most Recent Value  Chronic Disease   Chronic disease during today's visit Other  [Leaking of urostomy site, transportation to get to [facility] near his urologist Dr [name]  General Interventions   General Interventions Discussed/Reviewed General Interventions Reviewed, [employer], Doctor Visits, Communication with, Sick Day Rules  Doctor Visits Discussed/Reviewed --  [Discussed transportation options - Medicaid & UHC safe ride require scheduling 3 days prior to a scheduled appointment. He nor anyone he knows has a wheelchair accessible vehicle so he can go in his motorized wheelchair. + PTAR,]  PCP/Specialist Visits Compliance with follow-up visit  Communication with --  [conference calls with Key[place]re to [employer], UHC benefits, UHC proivide pre authorization, UHC safe ride, Gentle care services]  Education Interventions   Education Provided Provided Education  [gentle care, medicare guidelines for scheduling transportation , oncall weekend urology services]  Provided Verbal Education On Mental Health/Coping with Illness, When to see the doctor, [employer], Development worker, community  Mental Health Interventions   Mental Health Discussed/Reviewed Mental Health Reviewed, Coping Strategies, Other  [support &  encouragement provided]  Safety Interventions   Safety Discussed/Reviewed Safety Reviewed, Fall Risk  [Risk of being taken to facility  via car]              Our next appointment is by telephone on 07/30/23 at 1030  Please call the care guide team at [phone] if you need to cancel or reschedule your appointment.   If you are experiencing a Mental Health or Behavioral Health Crisis or need someone to talk to, please call the Suicide and Crisis Lifeline: 988 call the [place] National Suicide Prevention Lifeline: 928-022-3422 or TTY: 2032008302 TTY (302)618-8026) to talk to a trained counselor call 1-800-273-TALK (toll free, 24 hour hotline) go to [facility] [address], [place] [phone])   Patient verbalizes understanding of instructions and care plan provided today and agrees to view in MyChart. Active MyChart status and patient understanding of how to access instructions and care plan via MyChart confirmed with patient.     The patient has been provided with contact information for the care management team and has been advised to call with any health related questions or concerns.   Alonda Weaber L. Noelle Penner, RN, BSN, [facility]  VBCI Care Management Coordinator  [phone]  Fax: [phone]

## 2023-07-30 ENCOUNTER — Ambulatory Visit: Payer: Self-pay | Admitting: *Deleted

## 2023-07-30 ENCOUNTER — Encounter (HOSPITAL_COMMUNITY): Payer: Self-pay

## 2023-07-30 ENCOUNTER — Emergency Department (HOSPITAL_COMMUNITY)
Admission: EM | Admit: 2023-07-30 | Discharge: 2023-07-31 | Disposition: A | Discharge status: Other Acute Inpatient Hospital | Payer: 59 | Attending: Emergency Medicine | Admitting: Emergency Medicine

## 2023-07-30 DIAGNOSIS — F1994 Other psychoactive substance use, unspecified with psychoactive substance-induced mood disorder: Secondary | ICD-10-CM

## 2023-07-30 DIAGNOSIS — R109 Unspecified abdominal pain: Secondary | ICD-10-CM | POA: Diagnosis not present

## 2023-07-30 DIAGNOSIS — G8220 Paraplegia, unspecified: Secondary | ICD-10-CM

## 2023-07-30 DIAGNOSIS — N9989 Other postprocedural complications and disorders of genitourinary system: Secondary | ICD-10-CM | POA: Diagnosis present

## 2023-07-30 DIAGNOSIS — Z978 Presence of other specified devices: Secondary | ICD-10-CM

## 2023-07-30 DIAGNOSIS — N360 Urethral fistula: Secondary | ICD-10-CM

## 2023-07-30 DIAGNOSIS — N281 Cyst of kidney, acquired: Secondary | ICD-10-CM | POA: Diagnosis not present

## 2023-07-30 DIAGNOSIS — I10 Essential (primary) hypertension: Secondary | ICD-10-CM

## 2023-07-30 NOTE — ED Triage Notes (Signed)
Pt arrived via EMS from home. Pt is c/o leaking urine from penis and scrotum. Pt reports that suprapubic catheter was placed in April then on 9/18 he had surgery for hole in urethra.

## 2023-07-30 NOTE — Patient Instructions (Addendum)
Visit Information  Thank you for taking time to visit with me today. Please don't hesitate to contact me if I can be of assistance to you.   Following are the goals we discussed today:   Goals Addressed               This Visit's Progress     Patient Stated     Patient will obtain a stretcher transported visit to wound care center + out of county medicaid transportation as needed, decrease hospital visits [facility]) (pt-stated)   Not on track      Patient will continue to make attempts to get needed transportation to attend his wound care, pain management and urology visits as scheduled with available transportation- Updated He has not missed an appointment since ones that had to be rescheduled while inpatient for urology surgery (not in his control) - those were rescheduled-  Patient will continue to have home aide complete sacral and bladder wound care- Noted wound healing during recent clinic visit- MEETING GOAL  Patient will receive a mattress for his hospital bed - Pending  Interventions Today    Flowsheet Row Most Recent Value  Chronic Disease   Chronic disease during today's visit Other  [Worsening urinary leakage "soak and wet"/Need to get to [facility] (out of county) to see Dr Renetta Chalk CPT code/urology PTAR authization from [place] [employer]]  General Interventions   General Interventions Discussed/Reviewed General Interventions Reviewed, Sick Day Rules, [employer], Communication with  Doctor Visits Discussed/Reviewed Doctor Visits Reviewed, Specialist  PCP/Specialist Visits Compliance with follow-up visit, Contact provider for referral to  [employer] provider for referral to Specialist  [spoke with Morrie Sheldon in the absence of Triage nurse for Dr [name] to request authorization to [place] [employer] for [employer] transport out of county to [facility] ED or urology clinic _ ACPT code needs to be called into [facility] provider authorization [phone]  Communication with  PCP/Specialists, RN  [name] to Urology office message left for Urology RN & urology surgeon when informed the urology RN would be out of the office for a week with inpatient staff covering, Attempt to get CPT code to offer insurance carrier for transport]  Education Interventions   Education Provided Provided Education  [CPT code/Provider authorization for out of county transport to [facility]]  Mental Health Interventions   Mental Health Discussed/Reviewed Mental Health Reviewed, Coping Strategies, Other  [[name] reports disappointment in leaking urostomy site since recent surgery]             Our next appointment is by telephone on 07/31/23 at 1000  Please call the care guide team at [phone] if you need to cancel or reschedule your appointment.   If you are experiencing a Mental Health or Behavioral Health Crisis or need someone to talk to, please call the Suicide and Crisis Lifeline: 988 call the [place] National Suicide Prevention Lifeline: 224-688-7932 or TTY: 505-869-0859 TTY 540-270-2020) to talk to a trained counselor call 1-800-273-TALK (toll free, 24 hour hotline) go to [facility] [address] [place] [phone]) call 911   Patient verbalizes understanding of instructions and care plan provided today and agrees to view in MyChart. Active MyChart status and patient understanding of how to access instructions and care plan via MyChart confirmed with patient.     The patient has been provided with contact information for the care management team and has been advised to call with any health related questions or concerns.   [name] Elford L. Noelle Penner,  RN, BSN, [facility]  VBCI Care Management Coordinator  [phone]  Fax: [phone]

## 2023-07-31 ENCOUNTER — Ambulatory Visit: Payer: Self-pay | Admitting: *Deleted

## 2023-07-31 ENCOUNTER — Other Ambulatory Visit: Payer: Self-pay

## 2023-07-31 ENCOUNTER — Emergency Department (HOSPITAL_COMMUNITY): Payer: 59

## 2023-07-31 DIAGNOSIS — Z7401 Bed confinement status: Secondary | ICD-10-CM | POA: Diagnosis not present

## 2023-07-31 DIAGNOSIS — M109 Gout, unspecified: Secondary | ICD-10-CM | POA: Diagnosis not present

## 2023-07-31 DIAGNOSIS — K029 Dental caries, unspecified: Secondary | ICD-10-CM | POA: Diagnosis not present

## 2023-07-31 DIAGNOSIS — R112 Nausea with vomiting, unspecified: Secondary | ICD-10-CM | POA: Diagnosis not present

## 2023-07-31 DIAGNOSIS — Z9989 Dependence on other enabling machines and devices: Secondary | ICD-10-CM | POA: Diagnosis not present

## 2023-07-31 DIAGNOSIS — E278 Other specified disorders of adrenal gland: Secondary | ICD-10-CM | POA: Diagnosis not present

## 2023-07-31 DIAGNOSIS — Z452 Encounter for adjustment and management of vascular access device: Secondary | ICD-10-CM | POA: Diagnosis not present

## 2023-07-31 DIAGNOSIS — K567 Ileus, unspecified: Secondary | ICD-10-CM | POA: Diagnosis not present

## 2023-07-31 DIAGNOSIS — N281 Cyst of kidney, acquired: Secondary | ICD-10-CM | POA: Diagnosis not present

## 2023-07-31 DIAGNOSIS — K6389 Other specified diseases of intestine: Secondary | ICD-10-CM | POA: Diagnosis not present

## 2023-07-31 DIAGNOSIS — R221 Localized swelling, mass and lump, neck: Secondary | ICD-10-CM | POA: Diagnosis not present

## 2023-07-31 DIAGNOSIS — K219 Gastro-esophageal reflux disease without esophagitis: Secondary | ICD-10-CM | POA: Diagnosis not present

## 2023-07-31 DIAGNOSIS — R531 Weakness: Secondary | ICD-10-CM | POA: Diagnosis not present

## 2023-07-31 DIAGNOSIS — G8929 Other chronic pain: Secondary | ICD-10-CM | POA: Diagnosis not present

## 2023-07-31 DIAGNOSIS — R109 Unspecified abdominal pain: Secondary | ICD-10-CM | POA: Diagnosis not present

## 2023-07-31 DIAGNOSIS — I10 Essential (primary) hypertension: Secondary | ICD-10-CM | POA: Diagnosis not present

## 2023-07-31 DIAGNOSIS — Z87891 Personal history of nicotine dependence: Secondary | ICD-10-CM | POA: Diagnosis not present

## 2023-07-31 DIAGNOSIS — G8220 Paraplegia, unspecified: Secondary | ICD-10-CM | POA: Diagnosis not present

## 2023-07-31 DIAGNOSIS — M6250 Muscle wasting and atrophy, not elsewhere classified, unspecified site: Secondary | ICD-10-CM | POA: Diagnosis not present

## 2023-07-31 DIAGNOSIS — Z933 Colostomy status: Secondary | ICD-10-CM | POA: Diagnosis not present

## 2023-07-31 DIAGNOSIS — K592 Neurogenic bowel, not elsewhere classified: Secondary | ICD-10-CM | POA: Diagnosis not present

## 2023-07-31 DIAGNOSIS — E870 Hyperosmolality and hypernatremia: Secondary | ICD-10-CM | POA: Diagnosis not present

## 2023-07-31 DIAGNOSIS — N9989 Other postprocedural complications and disorders of genitourinary system: Secondary | ICD-10-CM | POA: Diagnosis present

## 2023-07-31 DIAGNOSIS — I878 Other specified disorders of veins: Secondary | ICD-10-CM | POA: Diagnosis not present

## 2023-07-31 DIAGNOSIS — G4733 Obstructive sleep apnea (adult) (pediatric): Secondary | ICD-10-CM | POA: Diagnosis not present

## 2023-07-31 DIAGNOSIS — Z993 Dependence on wheelchair: Secondary | ICD-10-CM | POA: Diagnosis not present

## 2023-07-31 DIAGNOSIS — N360 Urethral fistula: Secondary | ICD-10-CM | POA: Diagnosis not present

## 2023-07-31 DIAGNOSIS — Z4682 Encounter for fitting and adjustment of non-vascular catheter: Secondary | ICD-10-CM | POA: Diagnosis not present

## 2023-07-31 DIAGNOSIS — Z87898 Personal history of other specified conditions: Secondary | ICD-10-CM | POA: Diagnosis not present

## 2023-07-31 DIAGNOSIS — L98429 Non-pressure chronic ulcer of back with unspecified severity: Secondary | ICD-10-CM | POA: Diagnosis not present

## 2023-07-31 DIAGNOSIS — N179 Acute kidney failure, unspecified: Secondary | ICD-10-CM | POA: Diagnosis not present

## 2023-07-31 DIAGNOSIS — N319 Neuromuscular dysfunction of bladder, unspecified: Secondary | ICD-10-CM | POA: Diagnosis not present

## 2023-07-31 LAB — CBC WITH DIFFERENTIAL/PLATELET
Abs Immature Granulocytes: 0.03 10*3/uL (ref 0.00–0.07)
Basophils Absolute: 0 10*3/uL (ref 0.0–0.1)
Basophils Relative: 1 %
Eosinophils Absolute: 0.3 10*3/uL (ref 0.0–0.5)
Eosinophils Relative: 3 %
HCT: 35.9 % — ABNORMAL LOW (ref 39.0–52.0)
Hemoglobin: 10.5 g/dL — ABNORMAL LOW (ref 13.0–17.0)
Immature Granulocytes: 0 %
Lymphocytes Relative: 29 %
Lymphs Abs: 2.3 10*3/uL (ref 0.7–4.0)
MCH: 26.3 pg (ref 26.0–34.0)
MCHC: 29.2 g/dL — ABNORMAL LOW (ref 30.0–36.0)
MCV: 89.8 fL (ref 80.0–100.0)
Monocytes Absolute: 0.6 10*3/uL (ref 0.1–1.0)
Monocytes Relative: 8 %
Neutro Abs: 4.8 10*3/uL (ref 1.7–7.7)
Neutrophils Relative %: 59 %
Platelets: 192 10*3/uL (ref 150–400)
RBC: 4 MIL/uL — ABNORMAL LOW (ref 4.22–5.81)
RDW: 16 % — ABNORMAL HIGH (ref 11.5–15.5)
WBC: 8 10*3/uL (ref 4.0–10.5)
nRBC: 0 % (ref 0.0–0.2)

## 2023-07-31 LAB — URINALYSIS, ROUTINE W REFLEX MICROSCOPIC
Bilirubin Urine: NEGATIVE
Glucose, UA: NEGATIVE mg/dL
Hgb urine dipstick: NEGATIVE
Ketones, ur: NEGATIVE mg/dL
Nitrite: NEGATIVE
Protein, ur: >=300 mg/dL — AB
Specific Gravity, Urine: 1.012 (ref 1.005–1.030)
pH: 9 — ABNORMAL HIGH (ref 5.0–8.0)

## 2023-07-31 LAB — COMPREHENSIVE METABOLIC PANEL
ALT: 22 U/L (ref 0–44)
AST: 24 U/L (ref 15–41)
Albumin: 3.3 g/dL — ABNORMAL LOW (ref 3.5–5.0)
Alkaline Phosphatase: 93 U/L (ref 38–126)
Anion gap: 10 (ref 5–15)
BUN: 16 mg/dL (ref 6–20)
CO2: 30 mmol/L (ref 22–32)
Calcium: 8.9 mg/dL (ref 8.9–10.3)
Chloride: 98 mmol/L (ref 98–111)
Creatinine, Ser: 0.89 mg/dL (ref 0.61–1.24)
GFR, Estimated: >60 mL/min (ref >60)
Glucose, Bld: 126 mg/dL — ABNORMAL HIGH (ref 70–99)
Potassium: 4.1 mmol/L (ref 3.5–5.1)
Sodium: 138 mmol/L (ref 135–145)
Total Bilirubin: 0.1 mg/dL — ABNORMAL LOW (ref 0.3–1.2)
Total Protein: 7.7 g/dL (ref 6.5–8.1)

## 2023-07-31 MED ORDER — IOHEXOL 350 MG/ML SOLN
85.0000 mL | Freq: Once | INTRAVENOUS | Status: AC | PRN
  Administered 2023-07-31: 85 mL via INTRAVENOUS

## 2023-07-31 NOTE — Patient Instructions (Signed)
Visit Information  Thank you for taking time to visit with me today. Please don't hesitate to contact me if I can be of assistance to you.   Following are the goals we discussed today:   Goals Addressed               This Visit's Progress     Patient Stated     Patient will obtain a stretcher transported visit to wound care center + out of county medicaid transportation as needed, decrease hospital visits [facility]) (pt-stated)   On track      Patient will continue to make attempts to get needed transportation to attend his wound care, pain management and urology visits as scheduled with available transportation- Updated He has not missed an appointment since ones that had to be rescheduled while inpatient for urology surgery (not in his control) - those were rescheduled-  Patient will continue to have home aide complete sacral and bladder wound care- Noted wound healing during recent clinic visit- MEETING GOAL  Patient will receive a mattress for his hospital bed - Pending  Interventions Today    Flowsheet Row Most Recent Value  Chronic Disease   Chronic disease during today's visit Other  [urinary leakage after recent fistula surgery - admission to [facility] via transfer from [facility]]  General Interventions   General Interventions Discussed/Reviewed Communication with  Communication with RN, PCP/Specialists  [facility] urology updated that patient was admitted to [facility] 07/31/23]  Education Interventions   Education Provided Provided Education  Provided Verbal Education On Mental Health/Coping with Illness, Sick Day Rules, Other  [reviewed reasons suprapubic get clogged (bent, kink, bag above bladder, bag too full, dehydration, urine concentration, cloudy or blooding, position needs to be adjusted) Inquired about ? UTI]  Mental Health Interventions   Mental Health Discussed/Reviewed Mental Health Reviewed, Coping Strategies       Interventions Today    Flowsheet Row Most Recent Value   Chronic Disease   Chronic disease during today's visit Other  [urinary leakage after recent fistula surgery - admission to [facility] via transfer from [facility]]  General Interventions   General Interventions Discussed/Reviewed Communication with  Communication with RN, PCP/Specialists  [facility] urology updated that patient was admitted to [facility] 07/31/23]  Education Interventions   Education Provided Provided Education  Provided Verbal Education On Mental Health/Coping with Illness, Sick Day Rules, Other  [reviewed reasons suprapubic get clogged (bent, kink, bag above bladder, bag too full, dehydration, urine concentration, cloudy or blooding, position needs to be adjusted) Inquired about ? UTI]  Mental Health Interventions   Mental Health Discussed/Reviewed Mental Health Reviewed, Coping Strategies              Our next appointment is by telephone on 08/03/23 at 2:45 pm  Please call the care guide team at [phone] if you need to cancel or reschedule your appointment.   If you are experiencing a Mental Health or Behavioral Health Crisis or need someone to talk to, please call the Suicide and Crisis Lifeline: 988 call the [place] National Suicide Prevention Lifeline: 763 802 5448 or TTY: 5196982297 TTY 706 010 5869) to talk to a trained counselor call 1-800-273-TALK (toll free, 24 hour hotline) go to [facility] [address] [place] [phone]) call 911   Patient verbalizes understanding of instructions and care plan provided today and agrees to view in MyChart. Active MyChart status and patient understanding of how to access instructions and care plan via MyChart confirmed with patient.  The patient has been provided with contact information for the care management team and has been advised to call with any health related questions or concerns.   Patrice Moates L. Noelle Penner, RN, BSN, [facility]  VBCI Care Management Coordinator  [phone]  Fax: [phone]

## 2023-07-31 NOTE — Patient Outreach (Addendum)
Care Coordination   Follow Up Visit Note   07/31/2023 Name: [name] MRN: [id number] DOB: 09/28/1966  Tomio Gruver is a 57 y.o. year old male who sees [name], [place] for primary care. I spoke with  [employer] buyer by phone today.  What matters to the patients health and wellness today?  Worsening urinary symptoms leading to an ED visit to [facility] & a transfer to [facility] flushed the suprapubic (as it was blocked with the white secretion- this prevented urine from coming out of the suprapubic -therefore causing urine to back up to the penis and also through the recent surgery site) and applied a pure wick.He confirms his urology nurse had changed his suprapubic on 07/23/23. He is aware that the suprapubic had to be flushed monthly   Pending surgery on 08/01/23 via Dr [name].  Surgery per patient to include a placement of suprapubic and foley tubes with straightening the penis so the fistula can heal Patient has questions about "putting pressure on it" mentioned by Urologist- He will ask MD more questions Patient confirms he has a cushion in his wheelchair and bed air mattress for prevention of skin breakdown., therefore he manages pressure relief. He also reports he does not stay in his wheelchair for long periods at time and is generally in his bed when he is home.  Patient confirmed he does have an UTI and is on antibiotics  Patient voiced concern about possible skin breakdown from a dressing that may be left after the 08/01/23 pending procedure. He states he was informed the dressing will need to stay on for a month. His preference would be to have the dressing changed frequently to prevent skin breakdown as he wounds heal poorly.  He did cancel the Wednesday 08/01/23 safe ride transportation services     Goals Addressed               This Visit's Progress     Patient Stated     Patient will obtain a stretcher transported visit to wound care center + out of  county medicaid transportation as needed, decrease hospital visits [facility]) (pt-stated)         Patient will continue to make attempts to get needed transportation to attend his wound care, pain management and urology visits as scheduled with available transportation- Updated He has not missed an appointment since ones that had to be rescheduled while inpatient for urology surgery (not in his control) - those were rescheduled-  Patient will continue to have home aide complete sacral and bladder wound care- Noted wound healing during recent clinic visit- MEETING GOAL  Patient will receive a mattress for his hospital bed - Pending  Interventions Today    Flowsheet Row Most Recent Value  Chronic Disease   Chronic disease during today's visit Other  [urinary leakage after recent fistula surgery - admission to [facility] via transfer from [facility]]  General Interventions   General Interventions Discussed/Reviewed General Interventions Reviewed  Education Interventions   Education Provided Provided Education  Provided Verbal Education On Mental Health/Coping with Illness, Sick Day Rules, Other  [reviewed reasons suprapubic get clogged (bent, kink, bag above bladder, bag too full, dehydration, urine concentration, cloudy or blooding, position needs to be adjusted) Inquired about ? UTI]  Mental Health Interventions   Mental Health Discussed/Reviewed Mental Health Reviewed, Coping Strategies       Interventions Today    Flowsheet Row Most Recent Value  Chronic Disease   Chronic disease during  today's visit Other  [urinary leakage after recent fistula surgery - admission to [facility] via transfer from [facility]]  General Interventions   General Interventions Discussed/Reviewed General Interventions Reviewed  Education Interventions   Education Provided Provided Education  Provided Verbal Education On Mental Health/Coping with Illness, Sick Day Rules, Other  [reviewed reasons suprapubic get clogged (bent, kink, bag  above bladder, bag too full, dehydration, urine concentration, cloudy or blooding, position needs to be adjusted) Inquired about ? UTI]  Mental Health Interventions   Mental Health Discussed/Reviewed Mental Health Reviewed, Coping Strategies              SDOH assessments and interventions completed:  No     Care Coordination Interventions:  No, not indicated   Follow up plan: Follow up call scheduled for 08/03/23    Encounter Outcome:  Patient Visit Completed   Cala Bradford L. Noelle Penner, RN, BSN, [facility]  VBCI Care Management Coordinator  [phone]  Fax: [phone]

## 2023-07-31 NOTE — ED Notes (Signed)
Per provider, pt accepted to [facility]. Awaiting admit bed then pt to be transferred. Pt aware of plan.

## 2023-07-31 NOTE — ED Provider Notes (Signed)
[facility]-EMERGENCY DEPT [facility] Emergency Department Provider Note MRN:  [id number]  Arrival date & time: 07/31/23     Chief Complaint   Body Fluid Exposure   History of Present Illness   [name] is a 57 y.o. year-old male presents to the ED with chief complaint of urethrocutaneous fistula.  States he had recent urology procedure to fix the fistula.  Had been doing well until 4 days ago when he began having urine leaking from the surgical incision.  He also has some leaking from the tip of the penis, but only a small amount of urine is draining from the suprapubic catheter.  He states that he is concerned about the urine leading to another sacral decub, which has just healed.    History provided by patient.   Review of Systems  Pertinent positive and negative review of systems noted in HPI.    Physical Exam   Vitals:   07/31/23 0315 07/31/23 0400  BP: (!) 146/76 (!) 164/66  Pulse: 74 75  Resp:    Temp:    SpO2: (!) 81% 99%    CONSTITUTIONAL:  non toxic-appearing, NAD NEURO:  Alert and oriented x 3, CN 3-12 grossly intact ENT/NECK:  Supple, no stridor  CARDIO:  normal rate, regular rhythm, appears well-perfused  PULM:  No respiratory distress,  GI/GU:  non-distended, suprapubic cath in place, colostomy in place, urine leaking from beneath the scrotum and from the urethral meatus  MSK/SPINE:  No gross deformities, no edema, moves all extremities  SKIN:  no rash, atraumatic   *Additional and/or pertinent findings included in MDM below  Diagnostic and Interventional Summary    EKG Interpretation Date/Time:    Ventricular Rate:    PR Interval:    QRS Duration:    QT Interval:    QTC Calculation:   R Axis:      Text Interpretation:         Labs Reviewed  CBC WITH DIFFERENTIAL/PLATELET - Abnormal; Notable for the following components:      Result Value   RBC 4.00 (*)    Hemoglobin 10.5 (*)    HCT 35.9 (*)    MCHC 29.2 (*)    RDW 16.0 (*)    All  other components within normal limits  COMPREHENSIVE METABOLIC PANEL - Abnormal; Notable for the following components:   Glucose, Bld 126 (*)    Albumin 3.3 (*)    Total Bilirubin 0.1 (*)    All other components within normal limits  URINALYSIS, ROUTINE W REFLEX MICROSCOPIC - Abnormal; Notable for the following components:   APPearance TURBID (*)    pH 9.0 (*)    Protein, ur >=300 (*)    Leukocytes,Ua MODERATE (*)    Bacteria, UA MANY (*)    All other components within normal limits    CT ABDOMEN PELVIS W CONTRAST    (Results Pending)    Medications  iohexol (OMNIPAQUE) 350 MG/ML injection 85 mL (85 mLs Intravenous Contrast Given 07/31/23 0417)     Procedures  /  Critical Care Procedures  ED Course and Medical Decision Making  I have reviewed the triage vital signs, the nursing notes, and pertinent available records from the EMR.  Social Determinants Affecting Complexity of Care: Patient is paraplegic and unable to drive to appointments.   ED Course: Clinical Course as of 07/31/23 0437  Tue Jul 31, 2023  0301 [name] transfer center called for admission/transfer.  Waiting to hear back from [name] urology. [RB]  Clinical Course User Index [RB] [name], PA-C    Medical Decision Making Amount and/or Complexity of Data Reviewed Labs: ordered. Radiology: ordered.  Risk Prescription drug management.         Consultants: [name] for was able to reach [name] urology, [name], who acknowledges that not ideal to send patient home, but also no capacity to accommodate transfer at this time.  [name] to discuss with patient's surgeon Dr. [name] this morning, and will call us back with a more concrete plan.  I updated the patient of the current plan.  He is agreeable.   Treatment and Plan: Dispo pending [name] in AM.   Patient signed out to oncoming team. Plan: Wait for [name] to call back. See above.   Final Clinical Impressions(s) / ED Diagnoses      ICD-10-CM   1. Other postoperative complication involving genitourinary system  N99.89       ED Discharge Orders     None         Discharge Instructions Discussed with and Provided to Patient:   Discharge Instructions   None      [name], PA-C 07/31/23 5956    [name], MD 07/31/23 1926

## 2023-07-31 NOTE — ED Provider Notes (Signed)
Signout from [place] PA-C at shift change.  Patient awaiting plan from [facility].  I did receive a call from the [facility] transfer center.  Patient has been accepted by Dr. Ileene Rubens to [facility].  Awaiting bed assignment.  I coordinated transport with [employer] operational officer.  Plan is for [facility] transport.  Per secretary/[facility], patient has a room assigned.  Transfer pending.  Patient continues to be stable, no complaints at the current time. Updated on plan.  BP 126/78   Pulse 70   Temp 98 F (36.7 C)   Resp 20   Ht 6\' 1"  (1.854 m)   Wt (!) 155.1 kg   SpO2 97%   BMI 45.11 kg/m     Renne Crigler, PA-C 07/31/23 1610    Gerhard Munch, MD 08/01/23 1513

## 2023-07-31 NOTE — ED Notes (Signed)
Pt's 02 sat=52% while sleeping, returned to 96% on awakening. Pt states he usually wears a cpap at night. Pt placed on [place]. PA and RT notified. Will continue to monitor.

## 2023-07-31 NOTE — ED Notes (Signed)
Suprapubic catheter irrigated w/143mL saline. Pt tolerated well. Cloudy yellow/white urine with large pieces of whitish sediment present and draining into bag.

## 2023-07-31 NOTE — ED Notes (Signed)
Pt c/o being wet. Pt cleaned and linen replaced. Pt oozing urine from head of penis. Pt also has two holes under the penis where penis meets scrotum. Pt's scrotal and perianal area red and excoriated. Pt has large healing discolored area on buttocks and around anus. Skin breakdown noted on left buttock.Barrier cream placed on pt, mepilex dressing placed on coccyx area. Malewick placed on penis. [name] and PA at bedside. Pt to CT scan.

## 2023-07-31 NOTE — ED Notes (Signed)
Spoke with [facility] transfer center, pt accepted to [facility]. Attempted to call report to the RN but they were unavailable and will call back.

## 2023-08-01 ENCOUNTER — Ambulatory Visit: Payer: Self-pay | Admitting: *Deleted

## 2023-08-01 DIAGNOSIS — N360 Urethral fistula: Secondary | ICD-10-CM | POA: Diagnosis not present

## 2023-08-01 NOTE — Patient Instructions (Signed)
Visit Information  Thank you for taking time to visit with me today. Please don't hesitate to contact me if I can be of assistance to you.   Following are the goals we discussed today:   Goals Addressed               This Visit's Progress     Patient Stated     Patient will obtain a stretcher transported visit to wound care center + out of county medicaid transportation as needed, decrease hospital visits [facility]) (pt-stated)   On track      Patient will continue to make attempts to get needed transportation to attend his wound care, pain management and urology visits as scheduled with available transportation- Updated He has not missed an appointment since ones that had to be rescheduled while inpatient for urology surgery (not in his control) - those were rescheduled-  Patient will continue to have home aide complete sacral and bladder wound care- Noted wound healing during recent clinic visit- MEETING GOAL  Patient will receive a mattress for his hospital bed - Pending  Interventions Today    Flowsheet Row Most Recent Value  Chronic Disease   Chronic disease during today's visit Other  [[facility]ation [facility] ([facility])]       Interventions Today    Flowsheet Row Most Recent Value  Chronic Disease   Chronic disease during today's visit Other  [[facility]ation [facility] ([facility])]              Our next appointment is by telephone on 08/03/23 at 08/03/23  Please call the care guide team at [phone] if you need to cancel or reschedule your appointment.   If you are experiencing a Mental Health or Behavioral Health Crisis or need someone to talk to, please call the Suicide and Crisis Lifeline: 988 call the [place] National Suicide Prevention Lifeline: (551)367-5879 or TTY: 425-556-0974 TTY 501 715 8938) to talk to a trained counselor call 1-800-273-TALK (toll free, 24 hour hotline) go to [facility] [address], [place] [phone]) call 911   Patient verbalizes understanding of instructions and care plan provided today and agrees to view in MyChart. Active MyChart status and patient understanding of how to access instructions and care plan via MyChart confirmed with patient.     The patient has been provided with contact information for the care management team and has been advised to call with any health related questions or concerns.   Geena Weinhold L. Noelle Penner, RN, BSN, [facility]  VBCI Care Management Coordinator  [phone]  Fax: [phone]

## 2023-08-01 NOTE — Patient Outreach (Signed)
Care Coordination   Collaboration with [facility]  Visit Note   08/01/2023 Name: Dorlan Bramlette MRN: [id number] DOB: Sep 03, 1966  Tank Tonjes is a 57 y.o. year old male who sees [name], [place] for primary care. I  received a message from [place] at [facility] urology  What matters to the patients health and wellness today?  Returned a call to [facility]    Goals Addressed               This Visit's Progress     Patient Stated     Patient will obtain a stretcher transported visit to wound care center + out of county medicaid transportation as needed, decrease hospital visits [facility]) (pt-stated)   On track      Patient will continue to make attempts to get needed transportation to attend his wound care, pain management and urology visits as scheduled with available transportation- Updated He has not missed an appointment since ones that had to be rescheduled while inpatient for urology surgery (not in his control) - those were rescheduled-  Patient will continue to have home aide complete sacral and bladder wound care- Noted wound healing during recent clinic visit- MEETING GOAL  Patient will receive a mattress for his hospital bed - Pending  Interventions Today    Flowsheet Row Most Recent Value  Chronic Disease   Chronic disease during today's visit Other  [[facility]ation [facility] ([facility])]       Interventions Today    Flowsheet Row Most Recent Value  Chronic Disease   Chronic disease during today's visit Other  [[facility]ation [facility] ([facility])]              SDOH assessments and interventions completed:  No     Care Coordination Interventions:  Yes, provided   Follow up plan: Follow up call scheduled for 08/03/23    Encounter Outcome:  Patient Visit Completed   Cala Bradford L. Noelle Penner, RN, BSN, [facility]  VBCI Care Management Coordinator  [phone]  Fax: [phone]

## 2023-08-03 ENCOUNTER — Encounter: Payer: Self-pay | Admitting: *Deleted

## 2023-08-03 ENCOUNTER — Ambulatory Visit: Payer: Self-pay | Admitting: *Deleted

## 2023-08-03 NOTE — Patient Outreach (Signed)
  Care Coordination   Message to patient  Visit Note   08/03/2023 Name: Khy Pitre MRN: [id number] DOB: May 31, 1966  Aleksi Kornegay is a 57 y.o. year old male who sees [name], [place] for primary care. I  sent text to patient   What matters to the patients health and wellness today?  transportation    Goals Addressed               This Visit's Progress     Patient Stated     Patient will obtain a stretcher transported visit to wound care center + out of county medicaid transportation as needed, decrease hospital visits [facility]) (pt-stated)   Not on track      Patient will continue to make attempts to get needed transportation to attend his wound care, pain management and urology visits as scheduled with available transportation- Updated He has not missed an appointment since ones that had to be rescheduled while inpatient for urology surgery (not in his control) - those were rescheduled-  Patient will continue to have home aide complete sacral and bladder wound care- Noted wound healing during recent clinic visit- MEETING GOAL  Patient will receive a mattress for his hospital bed - Pending  Interventions Today    Flowsheet Row Most Recent Value  Chronic Disease   Chronic disease during today's visit Other  [transportation]  General Interventions   General Interventions Discussed/Reviewed Community Resources             SDOH assessments and interventions completed:  No     Care Coordination Interventions:  Yes, provided   Follow up plan: Follow up call scheduled for 08/10/23    Encounter Outcome:  Patient Visit Completed    Cala Bradford L. Noelle Penner, RN, BSN, [facility]  VBCI Care Management Coordinator  [phone]  Fax: [phone]

## 2023-08-03 NOTE — Patient Instructions (Addendum)
Visit Information  Thank you for taking time to visit with me today. Please don't hesitate to contact me if I can be of assistance to you.   Following are the goals we discussed today:   Goals Addressed               This Visit's Progress     Patient Stated     Patient will obtain a stretcher transported visit to wound care center + out of county medicaid transportation as needed, decrease hospital visits [facility]) (pt-stated)   Not on track      Patient will continue to make attempts to get needed transportation to attend his wound care, pain management and urology visits as scheduled with available transportation- Updated He has not missed an appointment since ones that had to be rescheduled while inpatient for urology surgery (not in his control) - those were rescheduled-  Patient will continue to have home aide complete sacral and bladder wound care- Noted wound healing during recent clinic visit- MEETING GOAL  Patient will receive a mattress for his hospital bed - Pending  Interventions Today    Flowsheet Row Most Recent Value  Chronic Disease   Chronic disease during today's visit Other  [VBCI multidisciplinary team discussion, update to patient]  General Interventions   General Interventions Discussed/Reviewed General Interventions Reviewed, Communication with, [employer], Doctor Visits  Doctor Visits Discussed/Reviewed Doctor Visits Reviewed, Specialist  Communication with RN, Social Work  Insurance claims handler with [employer], care guide, SWs, CMs]             Our next appointment is by telephone on 07/31/23 at ?  Please call the care guide team at [phone] if you need to cancel or reschedule your appointment.   If you are experiencing a Mental Health or Behavioral Health Crisis or need someone to talk to, please call the Suicide and Crisis Lifeline: 988 call the [place] National Suicide Prevention Lifeline: 938-683-8757 or TTY: 860-301-9786 TTY  864-612-5866) to talk to a trained counselor call 1-800-273-TALK (toll free, 24 hour hotline) go to [facility] [address] [place] [phone]) call 911   Patient verbalizes understanding of instructions and care plan provided today and agrees to view in MyChart. Active MyChart status and patient understanding of how to access instructions and care plan via MyChart confirmed with patient.     The patient has been provided with contact information for the care management team and has been advised to call with any health related questions or concerns.   Norvin Ohlin L. Noelle Penner, RN, BSN, [facility]  VBCI Care Management Coordinator  [phone]  Fax: [phone]

## 2023-08-03 NOTE — Patient Outreach (Addendum)
  Care Coordination   Follow Up Visit Note   08/03/2023 Name: [name] MRN: [id number] DOB: 1966/06/30  [name] is a 57 y.o. year old male who sees [name], [place] for primary care. I spoke with  [employer] buyer by phone today.  What matters to the patients health and wellness today?  Discharge from [facility] to home post fistula surgery    Goals Addressed               This Visit's Progress     Patient Stated     Patient will obtain a stretcher transported visit to wound care center + out of county medicaid transportation as needed, decrease hospital visits [facility]) (pt-stated)   On track      Patient will continue to make attempts to get needed transportation to attend his wound care, pain management and urology visits as scheduled with available transportation- Updated He has not missed an appointment since ones that had to be rescheduled while inpatient for urology surgery (not in his control) - those were rescheduled-  Patient will continue to have home aide complete sacral and bladder wound care- Noted wound healing during recent clinic visit- MEETING GOAL  Patient will receive a mattress for his hospital bed - Pending  Interventions Today    Flowsheet Row Most Recent Value  Chronic Disease   Chronic disease during today's visit Other  [Discharging from [facility] [place] to home today]  General Interventions   General Interventions Discussed/Reviewed General Interventions Reviewed, [employer], Doctor Visits, Communication with  [sent patient a Clinical cytogeneticist message of the attempt to get ride for 08/27/23 is too early Reminder of the time frames to call for appointments to [facility], DSS, PTAR]  Doctor Visits Discussed/Reviewed Doctor Visits Reviewed, Specialist  PCP/Specialist Visits Compliance with follow-up visit  Communication with --  [attempt to call in for 08/27/23 appointment with safe ride unsuccessful Can only call in 2 weeks before the appointment]   Mental Health Interventions   Mental Health Discussed/Reviewed Coping Strategies, Mental Health Reviewed       Interventions Today    Flowsheet Row Most Recent Value  Chronic Disease   Chronic disease during today's visit Other  [Discharging from [facility] [place] to home today]  General Interventions   General Interventions Discussed/Reviewed General Interventions Reviewed, [employer], Doctor Visits, Communication with  [sent patient a Clinical cytogeneticist message of the attempt to get ride for 08/27/23 is too early Reminder of the time frames to call for appointments to [facility], DSS, PTAR]  Doctor Visits Discussed/Reviewed Doctor Visits Reviewed, Specialist  PCP/Specialist Visits Compliance with follow-up visit  Communication with --  [attempt to call in for 08/27/23 appointment with safe ride unsuccessful Can only call in 2 weeks before the appointment]  Mental Health Interventions   Mental Health Discussed/Reviewed Coping Strategies, Mental Health Reviewed              SDOH assessments and interventions completed:  No     Care Coordination Interventions:  Yes, provided   Follow up plan: Follow up call scheduled for 08/10/23    Encounter Outcome:  Patient Visit Completed   Cala Bradford L. Noelle Penner, RN, BSN, [facility]  VBCI Care Management Coordinator  [phone]  Fax: [phone]

## 2023-08-03 NOTE — Patient Instructions (Addendum)
Visit Information  Thank you for taking time to visit with me today. Please don't hesitate to contact me if I can be of assistance to you.   Following are the goals we discussed today:   Goals Addressed               This Visit's Progress     Patient Stated     Patient will obtain a stretcher transported visit to wound care center + out of county medicaid transportation as needed, decrease [name] visits [name] [name]) (pt-stated)   On track      Patient will continue to make attempts to get needed transportation to attend his wound care, pain management and urology visits as scheduled with available transportation- Updated He has not missed an appointment since ones that had to be rescheduled while inpatient for urology surgery (not in his control) - those were rescheduled-  Patient will continue to have home aide complete sacral and bladder wound care- Noted wound healing during recent clinic visit- MEETING GOAL  Patient will receive a mattress for his [name] bed - Pending  Interventions Today    Flowsheet Row Most Recent Value  Chronic Disease   Chronic disease during today's visit Other  [Discharging from [facility] [place] to home today]  General Interventions   General Interventions Discussed/Reviewed General Interventions Reviewed, [employer], Communication with, Doctor Visits  [spoke w/Toni [phone] @[place]  vocational rehab to provide verbal referral. email referral to [facility] [place] driver rehab services [phone] Pt mychart message-attempt to 11/25th get ride too early Reminded of the times to call [facility] [name], DSS, PTAR]  Doctor Visits Discussed/Reviewed Doctor Visits Reviewed, Specialist  PCP/Specialist Visits Compliance with follow-up visit  Communication with --  [attempt to call in for 08/27/23 appointment with safe ride unsuccessful Can only call in 2 weeks before the appointment]  Mental Health Interventions   Mental Health Discussed/Reviewed Coping  Strategies, Mental Health Reviewed       Interventions Today    Flowsheet Row Most Recent Value  Chronic Disease   Chronic disease during today's visit Other  [Discharging from [facility] [place] to home today]  General Interventions   General Interventions Discussed/Reviewed General Interventions Reviewed, [employer], Communication with, Doctor Visits  [spoke w/Toni [phone] @[place]  vocational rehab to provide verbal referral. email referral to [place]  driver rehab services [phone] Pt mychart message-attempt to 11/25th get ride too early Reminded of the times to call [facility], DSS, PTAR]  Doctor Visits Discussed/Reviewed Doctor Visits Reviewed, Specialist  PCP/Specialist Visits Compliance with follow-up visit  Communication with --  [attempt to call in for 08/27/23 appointment with safe ride unsuccessful Can only call in 2 weeks before the appointment]  Mental Health Interventions   Mental Health Discussed/Reviewed Coping Strategies, Mental Health Reviewed              Our next appointment is by telephone on 08/10/23 at 1:45 pm  Please call the care guide team at [phone] if you need to cancel or reschedule your appointment.   If you are experiencing a Mental Health or Behavioral Health Crisis or need someone to talk to, please call the Suicide and Crisis Lifeline: 988 call the [place] National Suicide Prevention Lifeline: 470-633-1669 or TTY: 518-835-4139 TTY 5625208790) to talk to a trained counselor call 1-800-273-TALK (toll free, 24 hour hotline) go to [facility] [address] [place] [phone]) call 911   Patient verbalizes understanding of instructions and care plan provided today and  agrees to view in [place]. Active MyChart status and patient understanding of how to access instructions and care plan via MyChart confirmed with patient.     The patient has been provided with contact information  for the care management team and has been advised to call with any health related questions or concerns.    [name] [name] L. Noelle Penner, RN, BSN, [facility] [name]  VBCI Care Management Coordinator  [phone]  Fax: [phone]

## 2023-08-03 NOTE — Patient Outreach (Signed)
Care Coordination   Follow Up Visit Note   08/03/2023 Name: [name] MRN: [id number] DOB: 07-02-1966  [name] is a 57 y.o. year old male who sees [name], [place] for primary care. I spoke with  [name] by phone today.  What matters to the patients health and wellness today?   Further leaking of fistula, Patient left a message for RN CM at 1318 to state he was sad that he would not be discharged today 08/03/23 & he wanted to make sure he was evaluated by MD & treated to resolve the leakage prior to discharge. He stated he was no longer happy like he was earlier in the morning at 1144 when RN CM called to check on him.  He voiced not wanting to go home leaking, Vocational rehab referral, Drivers rehabilitation services referral, RN CM returned a call to patient to update him on these referrals.  08/03/23 1631 [name] answered RN CM's call and reported he was thankful RN CM called. He informed RN CM she was on speaker phone, "you called at the right time". He reported that his surgeon, an [facility] (who "I don't want her in my room" ) were present. He mentioned hospital security.   Many people were talking at the same time.  RN CM inquired if the nurse supervisor could step out of the room to possible help patient become calmer. MD stated he preferred she stayed. [name] was encouraged to be calmer. He allowed Dr [name] to speak Dr [name] discussed the plan of care, to attempt to get urethra fistula to heal by leaving a catheter in - the tube will divert saturation from the scrotum. Dr [name] discussed urine around catheter related to spasms.  Dr [name] prefers patient dressing to remain on for 4 weeks to prevent leak, allow the fistula to heal or further need of another urinary diversion similar to a colostomy Dr [name] confirmed he has observed the fistula site and reports it is "fine" and would like for the patient to trust the treatment  plan.  RN CM discussed [name]' discussed concern earlier in the week. His wish to have the fistula not drain at all to help prevent skin breakdown as it has been a long healing process for sacral wound healing plus to not leave the dressing on for 4 weeks. (Skin breakdown prevention) [name] with a history of slow wound healing especially of sacral wounds.  Dr [name] agreed this was a valid concern and possible leakage related to spasms  [name] stated he is aware of when he is having spasms. Patient reports his fistula leaking today is not related to spasms. He discussed his suprapubic,  "Not done what I was told would be done"  [name] merged his Aide [name] in the phone call. Various expressions made related to the treatment plan and the patient remaining hospitalized [name] , aide, voiced very concern with patient not leaving until the leakage is controlled. He discussed the difficulty in getting the sacral wounds to finally heal  [name] informed RN CM & aide "they have left the room" Patient reports the provider spoke aggressively to him and caused him to speak aggressively in return.  Discussed that verbal statements may have triggered a change in their communication. RN CM & aide discussed not being on the phone to hear parts of the conversation  Patient's [facility] nurse came in the room per patient Patient refuse to leave [facility]  Nurse shared with [name] that his treatment plan from his nurses would not change but "the doctors will not visit".  She stated she would connect an urinary bag to collect urine. [name] report the urine bag was connected to stop urine leakage.  The nurse discussed patient staying at [facility], Case manager services were mentioned and discharging after the weekend with care to resume at home. Discharge papers not signed per patient.  CM reviewed with the patient & aide what his nurse discussed. The urinary bag, patient remaining at [facility]  with pending discharge. Encouraged patient to consider this remaining time at [facility] as a time that the leakage can be observed for concerns, hopefully resolved and discussed by the nurses with his MD. Encouraged patient & MD to speak again at a later time.    Patient & aide [name] provided the updates related to vocational rehab referral, driver rehabilitation referral and the unsuccessful attempt to schedule a ride for 08/27/23.   [name]' speech was noted to be more calmer   Goals Addressed               This Visit's Progress     Patient Stated     Patient will obtain a stretcher transported visit to wound care center + out of county medicaid transportation as needed, decrease hospital visits [facility]) (pt-stated)   On track      Patient will continue to make attempts to get needed transportation to attend his wound care, pain management and urology visits as scheduled with available transportation- Updated He has not missed an appointment since ones that had to be rescheduled while inpatient for urology surgery (not in his control) - those were rescheduled-  Patient will continue to have home aide complete sacral and bladder wound care- Noted wound healing during recent clinic visit- MEETING GOAL  Patient will receive a mattress for his hospital bed - Pending  Interventions Today    Flowsheet Row Most Recent Value  Chronic Disease   Chronic disease during today's visit Other  [Discharging from [facility] [place] to home today]  General Interventions   General Interventions Discussed/Reviewed General Interventions Reviewed, [employer], Doctor Visits, Communication with  [sent patient a Clinical cytogeneticist message of the attempt to get ride for 08/27/23 is too early Reminder of the time frames to call for appointments to [facility], DSS, PTAR]  Doctor Visits Discussed/Reviewed Doctor Visits Reviewed, Specialist  PCP/Specialist Visits Compliance with follow-up visit  Communication with --   [attempt to call in for 08/27/23 appointment with safe ride unsuccessful Can only call in 2 weeks before the appointment]  Mental Health Interventions   Mental Health Discussed/Reviewed Coping Strategies, Mental Health Reviewed       Interventions Today    Flowsheet Row Most Recent Value  Chronic Disease   Chronic disease during today's visit Other  [Discharging from [facility] [place] to home today]  General Interventions   General Interventions Discussed/Reviewed General Interventions Reviewed, [employer], Doctor Visits, Communication with  [sent patient a Clinical cytogeneticist message of the attempt to get ride for 08/27/23 is too early Reminder of the time frames to call for appointments to [facility], DSS, PTAR]  Doctor Visits Discussed/Reviewed Doctor Visits Reviewed, Specialist  PCP/Specialist Visits Compliance with follow-up visit  Communication with --  [attempt to call in for 08/27/23 appointment with safe ride unsuccessful Can only call in 2 weeks before the appointment]  Mental Health Interventions   Mental Health Discussed/Reviewed Coping Strategies, Mental Health Reviewed  SDOH assessments and interventions completed:  No     Care Coordination Interventions:  Yes, provided   Follow up plan: Follow up call scheduled for 08/10/23    Encounter Outcome:  Patient Visit Completed    Cala Bradford L. Noelle Penner, RN, BSN, [facility]  VBCI Care Management Coordinator  [phone]  Fax: [phone]

## 2023-08-03 NOTE — Patient Instructions (Signed)
Visit Information  Thank you for taking time to visit with me today. Please don't hesitate to contact me if I can be of assistance to you.   Following are the goals we discussed today:   Goals Addressed               This Visit's Progress     Patient Stated     Patient will obtain a stretcher transported visit to wound care center + out of county medicaid transportation as needed, decrease hospital visits [facility]) (pt-stated)   Not on track      Patient will continue to make attempts to get needed transportation to attend his wound care, pain management and urology visits as scheduled with available transportation- Updated He has not missed an appointment since ones that had to be rescheduled while inpatient for urology surgery (not in his control) - those were rescheduled-  Patient will continue to have home aide complete sacral and bladder wound care- Noted wound healing during recent clinic visit- MEETING GOAL  Patient will receive a mattress for his hospital bed - Pending  Interventions Today    Flowsheet Row Most Recent Value  Chronic Disease   Chronic disease during today's visit Other  [transportation]  General Interventions   General Interventions Discussed/Reviewed Community Resources             Our next appointment is by telephone on 08/10/23 at 1:45 pm  Please call the care guide team at [phone] if you need to cancel or reschedule your appointment.   If you are experiencing a Mental Health or Behavioral Health Crisis or need someone to talk to, please call the Suicide and Crisis Lifeline: 988 call the [place] National Suicide Prevention Lifeline: (773)308-5625 or TTY: 774-563-6738 TTY 912-533-4321) to talk to a trained counselor call 1-800-273-TALK (toll free, 24 hour hotline) go to [facility] [address] [place] [phone])   Patient verbalizes understanding of instructions and care plan provided today and  agrees to view in MyChart. Active MyChart status and patient understanding of how to access instructions and care plan via MyChart confirmed with patient.     The patient has been provided with contact information for the care management team and has been advised to call with any health related questions or concerns.    Cong Hightower L. Noelle Penner, RN, BSN, [facility]  VBCI Care Management Coordinator  [phone]  Fax: [phone]

## 2023-08-05 DIAGNOSIS — R221 Localized swelling, mass and lump, neck: Secondary | ICD-10-CM | POA: Diagnosis not present

## 2023-08-09 ENCOUNTER — Ambulatory Visit: Payer: Self-pay | Admitting: *Deleted

## 2023-08-09 NOTE — Patient Instructions (Addendum)
Visit Information  Thank you for taking time to visit with me today. Please don't hesitate to contact me if I can be of assistance to you.   Following are the goals we discussed today:   Goals Addressed               This Visit's Progress     Patient Stated     Patient will obtain a stretcher transported visit to wound care center + out of county [facility] transportation as needed, Assistance with [employer] hospital visits [facility]) (pt-stated)   On track      Patient will continue to make attempts to get needed transportation to attend his wound care, pain management and urology visits as scheduled with available transportation- Updated He has not missed an appointment since ones that had to be rescheduled while inpatient for urology surgery (not in his control) - those were rescheduled-  Patient will continue to have home aide complete sacral and bladder wound care- Noted wound healing during recent clinic visit- MEETING GOAL  Patient will receive a mattress for his hospital bed - Pending Patient will get assistance to repair/convert his Zenaida Niece to a usable transport option.  Interventions Today    Flowsheet Row Most Recent Value  Chronic Disease   Chronic disease during today's visit Other  [sent an email to [employer] requesting outreach to patient for Zenaida Niece ramp/conversion assistance]  General Interventions   General Interventions Discussed/Reviewed --  [Sent an email on behalf of Mr Rape to [employer] - [place], [place]  [address]  [place], [place] [address]  [phone] Phone  [phone] Toll Free  [phone] Fax Pending responses to patient]  Communication with PCP/Specialists  [[employer] [phone] M-F 9-6 pm]             Our next appointment is by telephone on 08/10/23 at 1:45 pm  Please call the care guide team at [phone] if you need to cancel or reschedule your appointment.   If you are  experiencing a Mental Health or Behavioral Health Crisis or need someone to talk to, please call the Suicide and Crisis Lifeline: 988 call the [place] National Suicide Prevention Lifeline: (832)170-9664 or TTY: 779 282 7348 TTY 251-121-3304) to talk to a trained counselor call 1-800-273-TALK (toll free, 24 hour hotline) go to [facility] [address] [place] [phone]) call 911   Patient verbalizes understanding of instructions and care plan provided today and agrees to view in MyChart. Active MyChart status and patient understanding of how to access instructions and care plan via MyChart confirmed with patient.     The patient has been provided with contact information for the care management team and has been advised to call with any health related questions or concerns.   Jahrell Hamor L. Noelle Penner, RN, BSN, [facility]  VBCI Care Management Coordinator  [phone]  Fax: [phone]

## 2023-08-09 NOTE — Patient Outreach (Addendum)
  Care Coordination   Transportation/personal Zenaida Niece coordination  Visit Note   08/09/2023 Name: Keaton Beichner MRN: [id number] DOB: Aug 18, 1966  Dawsyn Fulfer is a 57 y.o. year old male who sees [name], [place] for primary care. I  sent an email to [employer]  What matters to the patients health and wellness today?  New resource offered by Leadership, Altamese Dilling Email to [employer] to provide patients email address to help with getting assistance for his Zenaida Niece ramp/conversion that will make it an operational option for transportation + To find out more about wheelchair-accessible vehicles or their conversion process    Goals Addressed               This Visit's Progress     Patient Stated     Patient will obtain a stretcher transported visit to wound care center + out of county [facility] transportation as needed, Assistance with Zenaida Niece conversiondecrease hospital visits [facility]) (pt-stated)   On track      Patient will continue to make attempts to get needed transportation to attend his wound care, pain management and urology visits as scheduled with available transportation- Updated He has not missed an appointment since ones that had to be rescheduled while inpatient for urology surgery (not in his control) - those were rescheduled-  Patient will continue to have home aide complete sacral and bladder wound care- Noted wound healing during recent clinic visit- MEETING GOAL  Patient will receive a mattress for his hospital bed - Pending Patient will get assistance to repair/convert his Zenaida Niece to a usable transport option.  Interventions Today    Flowsheet Row Most Recent Value  Chronic Disease   Chronic disease during today's visit Other  [sent an email to [employer] requesting outreach to patient for Zenaida Niece ramp/conversion assistance]  General Interventions   General Interventions Discussed/Reviewed --  [Sent an email on behalf of Mr Loftin to [employer]  [employer] - [place], [place]  [address]  [place], [place] [address]  [phone] Phone  [phone] Toll Free  [phone] Fax Pending responses to patient]  Communication with PCP/Specialists  [[employer] [phone] M-F 9-6 pm]             SDOH assessments and interventions completed:  No     Care Coordination Interventions:  Yes, provided   Follow up plan: Follow up call scheduled for 08/10/23    Encounter Outcome:  Patient Visit Completed   Cala Bradford L. Noelle Penner, RN, BSN, [facility]  VBCI Care Management Coordinator  [phone]  Fax: [phone]

## 2023-08-10 ENCOUNTER — Ambulatory Visit: Payer: Self-pay | Admitting: *Deleted

## 2023-08-10 NOTE — Patient Instructions (Signed)
Visit Information  Thank you for taking time to visit with me today. Please don't hesitate to contact me if I can be of assistance to you.   Following are the goals we discussed today:   Goals Addressed               This Visit's Progress     Patient Stated     Patient will obtain a stretcher transported visit to wound care center + out of county [facility] transportation as needed, Assistance with [employer] hospital visits [facility]) (pt-stated)   On track      Patient will continue to make attempts to get needed transportation to attend his wound care, pain management and urology visits as scheduled with available transportation- Updated He has not missed an appointment since ones that had to be rescheduled while inpatient for urology surgery (not in his control) - those were rescheduled-  Patient will continue to have home aide complete sacral and bladder wound care- Noted wound healing during recent clinic visit- MEETING GOAL  Patient will receive a mattress for his hospital bed - Pending Patient will get assistance to repair/convert his Zenaida Niece to a usable transport option.  Interventions Today    Flowsheet Row Most Recent Value  Chronic Disease   Chronic disease during today's visit Other  [sent an email to [employer] requesting outreach to patient for Zenaida Niece ramp/conversion assistance]  General Interventions   General Interventions Discussed/Reviewed --  [Sent an email on behalf of Mr Rupard to [employer] - [place], [place]  [address]  [place], [place] [address]  [phone] Phone  [phone] Toll Free  [phone] Fax Pending responses to patient]  Communication with PCP/Specialists  [[employer] [phone] M-F 9-6 pm]             Our next appointment is by telephone on 08/15/23 at 1115  Please call the care guide team at [phone] if you need to cancel or reschedule your appointment.   If you are  experiencing a Mental Health or Behavioral Health Crisis or need someone to talk to, please call the Suicide and Crisis Lifeline: 988 call the [place] National Suicide Prevention Lifeline: 617-559-6926 or TTY: 234-351-6200 TTY (650)509-0873) to talk to a trained counselor call 1-800-273-TALK (toll free, 24 hour hotline) call the [facility]: [phone] call 911   Patient verbalizes understanding of instructions and care plan provided today and agrees to view in MyChart. Active MyChart status and patient understanding of how to access instructions and care plan via MyChart confirmed with patient.     The patient has been provided with contact information for the care management team and has been advised to call with any health related questions or concerns.   Neyla Gauntt L. Noelle Penner, RN, BSN, [facility]  VBCI Care Management Coordinator  [phone]  Fax: [phone]

## 2023-08-10 NOTE — Patient Outreach (Signed)
  Care Coordination   Follow Up Visit Note   08/10/2023 Name: [name] MRN: [id number] DOB: 17-May-1966  [name] is a 56 y.o. year old male who sees [name], [place] for primary care. I spoke with  [name] by phone today.  What matters to the patients health and wellness today?  Remains at [facility] Zenaida Niece- has heard from several vendors Medicaid Single adult: $1,732 or less for [place] medicaid       Goals Addressed               This Visit's Progress     Patient Stated     Patient will obtain a stretcher transported visit to wound care center + out of county [facility] transportation as needed, Assistance with Zenaida Niece conversiondecrease hospital visits [facility]) (pt-stated)   On track      Patient will continue to make attempts to get needed transportation to attend his wound care, pain management and urology visits as scheduled with available transportation- Updated He has not missed an appointment since ones that had to be rescheduled while inpatient for urology surgery (not in his control) - those were rescheduled-  Patient will continue to have home aide complete sacral and bladder wound care- Noted wound healing during recent clinic visit- MEETING GOAL  Patient will receive a mattress for his hospital bed - Pending Patient will get assistance to repair/convert his Zenaida Niece to a usable transport option.  Interventions Today    Flowsheet Row Most Recent Value  Chronic Disease   Chronic disease during today's visit Other  [sent an email to [employer] requesting outreach to patient for Zenaida Niece ramp/conversion assistance]  General Interventions   General Interventions Discussed/Reviewed --  [Sent an email on behalf of [name] to [employer] - [place], [place]  [address]  [place], [place] [address]  [phone] Phone  [phone] Toll Free  [phone] Fax Pending responses to patient]  Communication with  PCP/Specialists  [[employer] [phone] M-F 9-6 pm]             SDOH assessments and interventions completed:  No     Care Coordination Interventions:  Yes, provided   Follow up plan: Follow up call scheduled for 08/15/23    Encounter Outcome:  Patient Visit Completed   Cala Bradford L. Noelle Penner, RN, BSN, [facility]  VBCI Care Management Coordinator  [phone]  Fax: [phone]

## 2023-08-15 ENCOUNTER — Encounter: Payer: Self-pay | Admitting: *Deleted

## 2023-08-17 ENCOUNTER — Ambulatory Visit: Payer: Self-pay | Admitting: *Deleted

## 2023-08-20 ENCOUNTER — Ambulatory Visit: Payer: Self-pay | Admitting: *Deleted

## 2023-08-20 NOTE — Patient Outreach (Signed)
  Care Coordination   Follow Up Visit Note   08/20/2023 late entry for 08/17/23 1457 Name: [name] MRN: [id number] DOB: October 26, 1965  [name] is a 57 y.o. year old male who sees [name], [place] for primary care. I spoke with  [name] buyer by phone today.  What matters to the patients health and wellness today?  Pending [name] discharge  Patient reports he no longer has an NG tube, He continues to have th dressing on his fistula and it has stopped leaking, He is not progressing to a regular meal and tolerating He is anticipating discharged possibly on 08/17/23. He, Firefighter, and Careers adviser are interacting better. [name], aide was also on the call and confirms patient improvements No sacral skin breakdown.  Patient agrees to follow up after discharge RN CM out of office 08/16/23, on 08/17/23 noticed the 08/17/23 wound care appointment not listed in EPIC (?cancelled)   Goals Addressed               This Visit's Progress     Patient Stated     Patient will obtain a stretcher transported visit to wound care center + out of county [facility] transportation as needed, Assistance with [employer] hospital visits [facility]) (pt-stated)   On track      Patient will continue to make attempts to get needed transportation to attend his wound care, pain management and urology visits as scheduled with available transportation- Updated He has not missed an appointment since ones that had to be rescheduled while inpatient for urology surgery (not in his control) - those were rescheduled-  Patient will continue to have home aide complete sacral and bladder wound care- Noted wound healing during recent clinic visit- MEETING GOAL  Patient will receive a mattress for his hospital bed - Pending Patient will get assistance to repair/convert his Zenaida Niece to a usable transport option. 08/17/23 Pending   Interventions Today    Flowsheet Row Most Recent Value  Chronic Disease    Chronic disease during today's visit Other  [ileus resolving]  General Interventions   General Interventions Discussed/Reviewed General Interventions Reviewed, Doctor Visits  [discussed wound care 08/17/23 discontinue.]  Doctor Visits Discussed/Reviewed Doctor Visits Reviewed, Specialist  Exercise Interventions   Exercise Discussed/Reviewed Exercise Reviewed, Physical Activity  Physical Activity Discussed/Reviewed Physical Activity Reviewed  Education Interventions   Education Provided Provided Education  Mental Health Interventions   Mental Health Discussed/Reviewed Mental Health Reviewed, Coping Strategies             SDOH assessments and interventions completed:  No     Care Coordination Interventions:  Yes, provided   Follow up plan: Follow up call scheduled for 08/20/23    Encounter Outcome:  Patient Visit Completed   Cala Bradford L. Noelle Penner, RN, BSN, [facility]  VBCI Care Management Coordinator  [phone]  Fax: [phone]

## 2023-08-20 NOTE — Patient Instructions (Signed)
Visit Information  Thank you for taking time to visit with me today. Please don't hesitate to contact me if I can be of assistance to you.   Following are the goals we discussed today:   Goals Addressed               This Visit's Progress     Patient Stated     Patient will obtain a stretcher transported visit to wound care center + out of county [facility] transportation as needed, Assistance with [employer] hospital visits [facility]) (pt-stated)   On track      Patient will continue to make attempts to get needed transportation to attend his wound care, pain management and urology visits as scheduled with available transportation- Updated He has not missed an appointment since ones that had to be rescheduled while inpatient for urology surgery (not in his control) - those were rescheduled-  Patient will continue to have home aide complete sacral and bladder wound care- Noted wound healing during recent clinic visit- MEETING GOAL  Patient will receive a mattress for his hospital bed - Pending Patient will get assistance to repair/convert his Zenaida Niece to a usable transport option. 08/17/23 Pending   Interventions Today    Flowsheet Row Most Recent Value  Chronic Disease   Chronic disease during today's visit Other  [assisted patient with resetting his mychart & [name] accounts to his [facility] mychart - success]             Our next appointment is by telephone on 08/28/23 at 2:45 pm  Please call the care guide team at [phone] if you need to cancel or reschedule your appointment.   If you are experiencing a Mental Health or Behavioral Health Crisis or need someone to talk to, please call the Suicide and Crisis Lifeline: 988 call the [place] National Suicide Prevention Lifeline: 463 575 0127 or TTY: (202)041-7128 TTY 984-806-5751) to talk to a trained counselor call 1-800-273-TALK (toll free, 24 hour hotline) go to [facility]  Urgent Care [address] [place] [phone]) call 911   Patient verbalizes understanding of instructions and care plan provided today and agrees to view in MyChart. Active MyChart status and patient understanding of how to access instructions and care plan via MyChart confirmed with patient.     The patient has been provided with contact information for the care management team and has been advised to call with any health related questions or concerns.   Pammy Vesey L. Noelle Penner, RN, BSN, [facility]  VBCI Care Management Coordinator  [phone]  Fax: [phone]

## 2023-08-20 NOTE — Patient Instructions (Signed)
Visit Information  Thank you for taking time to visit with me today. Please don't hesitate to contact me if I can be of assistance to you.   Following are the goals we discussed today:   Goals Addressed               This Visit's Progress     Patient Stated     Patient will obtain a stretcher transported visit to wound care center + out of county [facility] transportation as needed, Assistance with [employer] hospital visits [facility]) (pt-stated)   On track      Patient will continue to make attempts to get needed transportation to attend his wound care, pain management and urology visits as scheduled with available transportation- Updated He has not missed an appointment since ones that had to be rescheduled while inpatient for urology surgery (not in his control) - those were rescheduled-  Patient will continue to have home aide complete sacral and bladder wound care- Noted wound healing during recent clinic visit- MEETING GOAL  Patient will receive a mattress for his hospital bed - Pending Patient will get assistance to repair/convert his Zenaida Niece to a usable transport option. 08/17/23 Pending   Interventions Today    Flowsheet Row Most Recent Value  Chronic Disease   Chronic disease during today's visit Other  [ileus resolving]  General Interventions   General Interventions Discussed/Reviewed General Interventions Reviewed, Doctor Visits  [discussed wound care 08/17/23 discontinue.]  Doctor Visits Discussed/Reviewed Doctor Visits Reviewed, Specialist  Exercise Interventions   Exercise Discussed/Reviewed Exercise Reviewed, Physical Activity  Physical Activity Discussed/Reviewed Physical Activity Reviewed  Education Interventions   Education Provided Provided Education  Mental Health Interventions   Mental Health Discussed/Reviewed Mental Health Reviewed, Coping Strategies             Our next appointment is by telephone on 08/20/23 at 1045  Please call the  care guide team at [phone] if you need to cancel or reschedule your appointment.   If you are experiencing a Mental Health or Behavioral Health Crisis or need someone to talk to, please call the Suicide and Crisis Lifeline: 988 call the [place] National Suicide Prevention Lifeline: 504-461-8040 or TTY: 684-418-8869 TTY 650 714 6891) to talk to a trained counselor call 1-800-273-TALK (toll free, 24 hour hotline) go to [facility] Urgent Care [address] [place] [phone]) call 911   Patient verbalizes understanding of instructions and care plan provided today and agrees to view in MyChart. Active MyChart status and patient understanding of how to access instructions and care plan via MyChart confirmed with patient.     The patient has been provided with contact information for the care management team and has been advised to call with any health related questions or concerns.   Trynity Skousen L. Noelle Penner, RN, BSN, [facility]  VBCI Care Management Coordinator  [phone]  Fax: [phone]

## 2023-08-20 NOTE — Patient Outreach (Signed)
  Care Coordination   Follow Up Visit Note   08/20/2023 Name: [name] MRN: [id number] DOB: 24-Apr-1966  Joseph Pudlo is a 57 y.o. year old male who sees [name], [place] for primary care. I spoke with  [employer] buyer by phone today.  What matters to the patients health and wellness today?  Discharging from [facility] to home on today  Transportation to post hospital visits to pcp & Dr [name] He was called by Dr Nelda Severe office about missed 08/17/23 visit Will reschedule later    Patient was updated on his scheduled rides or 08/24/23 & 08/27/23  He confirmed the change from 11/21 to 08/24/23 was okay. Confirmed he will receive text/email confirmation from safe ride Working on Librarian, academic   Goals Addressed               This Visit's Progress     Patient Stated     Patient will obtain a stretcher transported visit to wound care center + out of county [facility] transportation as needed, Assistance with [employer] hospital visits [facility]) (pt-stated)   On track      Patient will continue to make attempts to get needed transportation to attend his wound care, pain management and urology visits as scheduled with available transportation- Updated He has not missed an appointment since ones that had to be rescheduled while inpatient for urology surgery (not in his control) - those were rescheduled-  Patient will continue to have home aide complete sacral and bladder wound care- Noted wound healing during recent clinic visit- MEETING GOAL  Patient will receive a mattress for his hospital bed - Pending Patient will get assistance to repair/convert his Zenaida Niece to a usable transport option. 08/20/23 Pending   Interventions Today    Flowsheet Row Most Recent Value  Chronic Disease   Chronic disease during today's visit Other  [transportation for post hospital visits]  General Interventions   General Interventions Discussed/Reviewed General  Interventions Reviewed, Doctor Visits, Communication with  Doctor Visits Discussed/Reviewed PCP, Specialist, Doctor Visits Reviewed  PCP/Specialist Visits --  [coordinated rides to pcp and Dr [name] with [facility] & pcp staff. His 08/04/23 0900 pcp visit  had to be changed to 08/24/23 1100  related to [facility] needing 72 hours prior to booking rides]  Contacted provider for referral to Specialist, PCP  Communication with PCP/Specialists  [[employer] transportation services]             SDOH assessments and interventions completed:  No     Care Coordination Interventions:  Yes, provided   Follow up plan: Follow up call scheduled for 08/28/23 2:45 pm    Encounter Outcome:  Patient Visit Completed   Ayron Fillinger L. Noelle Penner, RN, BSN, [facility]  VBCI Care Management Coordinator  [phone]  Fax: [phone]

## 2023-08-20 NOTE — Patient Instructions (Signed)
Visit Information  Thank you for taking time to visit with me today. Please don't hesitate to contact me if I can be of assistance to you.   Following are the goals we discussed today:   Goals Addressed               This Visit's Progress     Patient Stated     Patient will obtain a stretcher transported visit to wound care center + out of county [facility] transportation as needed, Assistance with [employer] hospital visits [facility]) (pt-stated)   On track      Patient will continue to make attempts to get needed transportation to attend his wound care, pain management and urology visits as scheduled with available transportation- Updated He has not missed an appointment since ones that had to be rescheduled while inpatient for urology surgery (not in his control) - those were rescheduled-  Patient will continue to have home aide complete sacral and bladder wound care- Noted wound healing during recent clinic visit- MEETING GOAL  Patient will receive a mattress for his hospital bed - Pending Patient will get assistance to repair/convert his Zenaida Niece to a usable transport option. 08/20/23 Pending   Interventions Today    Flowsheet Row Most Recent Value  Chronic Disease   Chronic disease during today's visit Other  [transportation for post hospital visits]  General Interventions   General Interventions Discussed/Reviewed General Interventions Reviewed, Doctor Visits, Communication with  Doctor Visits Discussed/Reviewed PCP, Specialist, Doctor Visits Reviewed  PCP/Specialist Visits --  [coordinated rides to pcp and Dr [name] with [facility] & pcp staff. His 08/04/23 0900 pcp visit  had to be changed to 08/24/23 1100  related to [facility] needing 72 hours prior to booking rides]  Contacted provider for referral to Specialist, PCP  Communication with PCP/Specialists  [[employer] transportation services]             Our next appointment is by telephone on 08/28/23 at 2:45 pm  Please  call the care guide team at [phone] if you need to cancel or reschedule your appointment.   If you are experiencing a Mental Health or Behavioral Health Crisis or need someone to talk to, please call the Suicide and Crisis Lifeline: 988 call the [place] National Suicide Prevention Lifeline: 606-398-1540 or TTY: (775)679-2660 TTY 3205263621) to talk to a trained counselor call 1-800-273-TALK (toll free, 24 hour hotline) go to [facility] Urgent Care [address], [place] [phone]) call 911   Patient verbalizes understanding of instructions and care plan provided today and agrees to view in MyChart. Active MyChart status and patient understanding of how to access instructions and care plan via MyChart confirmed with patient.     The patient has been provided with contact information for the care management team and has been advised to call with any health related questions or concerns.    [name] L. Noelle Penner, RN, BSN, [facility]  VBCI Care Management Coordinator  [phone]  Fax: [phone]

## 2023-08-20 NOTE — Patient Outreach (Signed)
  Care Coordination   MyChart management  Visit Note   08/20/2023 Name: [name] MRN: [id number] DOB: 1965/10/28  Ledford Huenefeld is a 57 y.o. year old male who sees [name], [place] for primary care. I spoke with  [employer] buyer by phone today.  What matters to the patients health and wellness today?  My chart access/links    Goals Addressed               This Visit's Progress     Patient Stated     Patient will obtain a stretcher transported visit to wound care center + out of county [facility] transportation as needed, Assistance with [employer] hospital visits [facility]) (pt-stated)   On track      Patient will continue to make attempts to get needed transportation to attend his wound care, pain management and urology visits as scheduled with available transportation- Updated He has not missed an appointment since ones that had to be rescheduled while inpatient for urology surgery (not in his control) - those were rescheduled-  Patient will continue to have home aide complete sacral and bladder wound care- Noted wound healing during recent clinic visit- MEETING GOAL  Patient will receive a mattress for his hospital bed - Pending Patient will get assistance to repair/convert his Zenaida Niece to a usable transport option. 08/17/23 Pending   Interventions Today    Flowsheet Row Most Recent Value  Chronic Disease   Chronic disease during today's visit Other  [assisted patient with resetting his mychart & linking [name] accounts to his [facility] mychart - success]             SDOH assessments and interventions completed:  No     Care Coordination Interventions:  Yes, provided   Follow up plan: Follow up call scheduled for 08/28/23    Encounter Outcome:  Patient Visit Completed   Cala Bradford L. Noelle Penner, RN, BSN, [facility]  VBCI Care Management Coordinator  [phone]  Fax: [phone]

## 2023-08-24 DIAGNOSIS — R300 Dysuria: Secondary | ICD-10-CM | POA: Diagnosis not present

## 2023-08-24 DIAGNOSIS — R7302 Impaired glucose tolerance (oral): Secondary | ICD-10-CM | POA: Diagnosis not present

## 2023-08-24 DIAGNOSIS — M545 Low back pain, unspecified: Secondary | ICD-10-CM | POA: Diagnosis not present

## 2023-08-24 DIAGNOSIS — Z79899 Other long term (current) drug therapy: Secondary | ICD-10-CM | POA: Diagnosis not present

## 2023-08-24 DIAGNOSIS — G8221 Paraplegia, complete: Secondary | ICD-10-CM | POA: Diagnosis not present

## 2023-08-24 DIAGNOSIS — G4733 Obstructive sleep apnea (adult) (pediatric): Secondary | ICD-10-CM | POA: Diagnosis not present

## 2023-08-27 DIAGNOSIS — Z4889 Encounter for other specified surgical aftercare: Secondary | ICD-10-CM | POA: Diagnosis not present

## 2023-08-27 DIAGNOSIS — N319 Neuromuscular dysfunction of bladder, unspecified: Secondary | ICD-10-CM | POA: Diagnosis not present

## 2023-08-27 DIAGNOSIS — N360 Urethral fistula: Secondary | ICD-10-CM | POA: Diagnosis not present

## 2023-08-28 ENCOUNTER — Ambulatory Visit: Payer: Self-pay | Admitting: *Deleted

## 2023-08-28 NOTE — Patient Outreach (Signed)
  Care Coordination   Follow Up Visit Note   08/29/2023 late entry for 08/28/23 Name: [name] MRN: [id number] DOB: [birth year]  [name] is a 57 y.o. year old male who sees [name], [place] for primary care. I spoke with  [employer] buyer by phone today.  What matters to the patients health and wellness today?  Follow up visit to Dr Orlie Pollen, [facility] associate professor of urology & plastic surgery. Dressing removed. Fistula remains open.  Patient reports 2 different opinions received from MD and assistant on the status of the fistula opening Patient is able to voice that he aware that if his suprapubic catheter backs up, he will have further leaking of urine from the fistula opening  Patient has a picture of the fistula site "It is still not healed"    Dentist to be seen on 09/12/23   Need to reschedule his [place] care [place] & transportation. Agreed to have RN CM to reschedule his appointment.  09/13/23 0845 was the date, time provided by the wound care center    Goals Addressed               This Visit's Progress     Patient Stated     Patient will obtain a stretcher transport visit to wound care center + out of county [facility] transportation as needed, Assistance with [employer] hospital visits [facility]) (pt-stated)        New goal- Patient will have healing of his fistula opening  Patient will continue to make attempts to get needed transportation to attend his wound care, pain management and urology visits as scheduled with available transportation- Updated He has not missed an appointment since ones that had to be rescheduled while inpatient for urology surgery (not in his control) - those were rescheduled-  Patient will continue to have home aide complete sacral and bladder wound care- Noted wound healing during recent clinic visit- MEETING GOAL  Patient will receive a mattress for his hospital bed - Pending Patient will get  assistance to repair/convert his Zenaida Niece to a usable transport option. 08/28/23 Pending   Interventions Today    Flowsheet Row Most Recent Value  Chronic Disease   Chronic disease during today's visit Other  [urology follow up]  General Interventions   General Interventions Discussed/Reviewed General Interventions Reviewed, Durable Medical Equipment (DME), Doctor Visits, Communication with  Doctor Visits Discussed/Reviewed Specialist, Doctor Visits Reviewed  PCP/Specialist Visits Compliance with follow-up visit  Communication with PCP/Specialists  [outreach to wound care clinic to reschedule his appointment for 09/13/23 at 0845. Holiday representative with [facility] will be scheduled the day prior]  Education Interventions   Education Provided Provided Education  Provided Verbal Education On Other, When to see the doctor, [employer], Mental Health/Coping with Illness  [Discussed patient feelings about finding out his fistula is not healed after follow the treatment plan, discussed re engaging with urologist, Discussed [facility] urology services, Discussed future fistula treatment & rescheduling wound care clinic appointment]  Mental Health Interventions   Mental Health Discussed/Reviewed Mental Health Reviewed, Coping Strategies             SDOH assessments and interventions completed:  No     Care Coordination Interventions:  Yes, provided   Follow up plan: Follow up call scheduled for 09/05/23 1115    Encounter Outcome:  Patient Visit Completed   Cala Bradford L. Noelle Penner, RN, BSN, [facility]  VBCI Care Management Coordinator  [phone]  Fax: [phone]

## 2023-08-29 NOTE — Patient Instructions (Signed)
Visit Information  Thank you for taking time to visit with me today. Please don't hesitate to contact me if I can be of assistance to you.   Following are the goals we discussed today:   Goals Addressed               This Visit's Progress     Patient Stated     Patient will obtain a stretcher transport visit to wound care center + out of county [facility] transportation as needed, Assistance with [employer] hospital visits [facility]) (pt-stated)        New goal- Patient will have healing of his fistula opening  Patient will continue to make attempts to get needed transportation to attend his wound care, pain management and urology visits as scheduled with available transportation- Updated He has not missed an appointment since ones that had to be rescheduled while inpatient for urology surgery (not in his control) - those were rescheduled-  Patient will continue to have home aide complete sacral and bladder wound care- Noted wound healing during recent clinic visit- MEETING GOAL  Patient will receive a mattress for his hospital bed - Pending Patient will get assistance to repair/convert his Zenaida Niece to a usable transport option. 08/28/23 Pending   Interventions Today    Flowsheet Row Most Recent Value  Chronic Disease   Chronic disease during today's visit Other  [urology follow up]  General Interventions   General Interventions Discussed/Reviewed General Interventions Reviewed, Durable Medical Equipment (DME), Doctor Visits, Communication with  Doctor Visits Discussed/Reviewed Specialist, Doctor Visits Reviewed  PCP/Specialist Visits Compliance with follow-up visit  Communication with PCP/Specialists  [outreach to wound care clinic to reschedule his appointment for 09/13/23 at 0845. Holiday representative with [facility] will be scheduled the day prior]  Education Interventions   Education Provided Provided Education  Provided Verbal Education On Other, When to see the doctor,  [facility], Mental Health/Coping with Illness  [Discussed patient feelings about finding out his fistula is not healed after follow the treatment plan, discussed re engaging with urologist, Discussed [facility] urology services, Discussed future fistula treatment & rescheduling wound care clinic appointment]  Mental Health Interventions   Mental Health Discussed/Reviewed Mental Health Reviewed, Coping Strategies             Our next appointment is by telephone on 09/05/23 at 1115  Please call the care guide team at [phone] if you need to cancel or reschedule your appointment.   If you are experiencing a Mental Health or Behavioral Health Crisis or need someone to talk to, please call the Suicide and Crisis Lifeline: 988 call the [place] National Suicide Prevention Lifeline: (845)083-1522 or TTY: 5480172314 TTY 6361728172) to talk to a trained counselor call 1-800-273-TALK (toll free, 24 hour hotline) call the [facility]: [phone] call 911   Patient verbalizes understanding of instructions and care plan provided today and agrees to view in MyChart. Active MyChart status and patient understanding of how to access instructions and care plan via MyChart confirmed with patient.     The patient has been provided with contact information for the care management team and has been advised to call with any health related questions or concerns.   Chasitty Hehl L. Noelle Penner, RN, BSN, [facility]  VBCI Care Management Coordinator  [phone]  Fax: [phone]

## 2023-09-01 DIAGNOSIS — Z433 Encounter for attention to colostomy: Secondary | ICD-10-CM | POA: Diagnosis not present

## 2023-09-01 DIAGNOSIS — Z933 Colostomy status: Secondary | ICD-10-CM | POA: Diagnosis not present

## 2023-09-05 ENCOUNTER — Ambulatory Visit: Payer: Self-pay | Admitting: *Deleted

## 2023-09-05 NOTE — Patient Outreach (Signed)
  Care Coordination   Follow Up Visit Note   09/05/2023 Name: [name] MRN: [id number] DOB: [birth year]  [name] is a 57 y.o. year old male who sees [name], [place] for primary care. I spoke with  [employer] buyer by phone today.  What matters to the patients health and wellness today?  Fistula wound healing, transportation to appointments  Fistula patient reports he and his aide has been caring for his fistula site which is healing well. They have been removing the dressing daily cleaning it, He reports no leaking.   transportation [facility] at 1030 on 09/12/23   [address], [place], [place] [address] [phone] Transportation to wound care on 09/13/23 will be completed on 09/12/23 via [facility] Transportation for return to urology is on 09/24/23 and will also have to be scheduled after 09/10/23 for [facility] 2 week requirements Has 18 round trips left with [place] [facility] for 2024     Goals Addressed               This Visit's Progress     Patient Stated     Patient will obtain a stretcher transport visit to wound care center + out of county [facility] transportation as needed, Assistance with [employer] hospital visits [facility]) (pt-stated)   On track     New goal- Patient will have healing of his fistula opening  Patient will continue to make attempts to get needed transportation to attend his wound care, pain management and urology visits as scheduled with available transportation- Updated He has not missed an appointment since ones that had to be rescheduled while inpatient for urology surgery (not in his control) - those were rescheduled-  Patient will continue to have home aide complete sacral and bladder wound care- Noted wound healing during recent clinic visit- MEETING GOAL  Patient will receive a mattress for his hospital bed - Pending Patient will get assistance to repair/convert his Zenaida Niece to a usable transport option. 08/28/23  Pending   Interventions Today    Flowsheet Row Most Recent Value  Chronic Disease   Chronic disease during today's visit Other  [urology follow up]  General Interventions   General Interventions Discussed/Reviewed General Interventions Reviewed, Durable Medical Equipment (DME), Doctor Visits, Communication with  Doctor Visits Discussed/Reviewed Specialist, Doctor Visits Reviewed  PCP/Specialist Visits Compliance with follow-up visit  Communication with PCP/Specialists  [outreach to wound care clinic to reschedule his appointment for 09/13/23 at 0845. Holiday representative with [facility] will be scheduled the day prior]  Education Interventions   Education Provided Provided Education  Provided Verbal Education On Other, When to see the doctor, [facility], Mental Health/Coping with Illness  [Discussed patient feelings about finding out his fistula is not healed after follow the treatment plan, discussed re engaging with urologist, Discussed [facility] urology services, Discussed future fistula treatment & rescheduling wound care clinic appointment]  Mental Health Interventions   Mental Health Discussed/Reviewed Mental Health Reviewed, Coping Strategies             SDOH assessments and interventions completed:  No     Care Coordination Interventions:  Yes, provided   Follow up plan: Follow up call scheduled for 09/12/23    Encounter Outcome:  Patient Visit Completed   Cala Bradford L. Noelle Penner, RN, BSN, [facility]  VBCI Care Management Coordinator  [phone]  Fax: [phone]

## 2023-09-07 NOTE — Patient Instructions (Signed)
Visit Information  Thank you for taking time to visit with me today. Please don't hesitate to contact me if I can be of assistance to you.   Following are the goals we discussed today:   Goals Addressed               This Visit's Progress     Patient Stated     Patient will obtain a stretcher transport visit to wound care center + out of county [facility] transportation as needed, Assistance with [employer] hospital visits [facility]) (pt-stated)   On track     New goal- Patient will have healing of his fistula opening  Patient will continue to make attempts to get needed transportation to attend his wound care, pain management and urology visits as scheduled with available transportation- Updated He has not missed an appointment since ones that had to be rescheduled while inpatient for urology surgery (not in his control) - those were rescheduled-  Patient will continue to have home aide complete sacral and bladder wound care- Noted wound healing during recent clinic visit- MEETING GOAL  Patient will receive a mattress for his hospital bed - Pending Patient will get assistance to repair/convert his Zenaida Niece to a usable transport option. 08/28/23 Pending   Interventions Today    Flowsheet Row Most Recent Value  Chronic Disease   Chronic disease during today's visit Other  [urology follow up]  General Interventions   General Interventions Discussed/Reviewed General Interventions Reviewed, Durable Medical Equipment (DME), Doctor Visits, Communication with  Doctor Visits Discussed/Reviewed Specialist, Doctor Visits Reviewed  PCP/Specialist Visits Compliance with follow-up visit  Communication with PCP/Specialists  [outreach to wound care clinic to reschedule his appointment for 09/13/23 at 0845. Holiday representative with [facility] will be scheduled the day prior]  Education Interventions   Education Provided Provided Education  Provided Verbal Education On Other, When to see the  doctor, [facility], Mental Health/Coping with Illness  [Discussed patient feelings about finding out his fistula is not healed after follow the treatment plan, discussed re engaging with urologist, Discussed [facility] urology services, Discussed future fistula treatment & rescheduling wound care clinic appointment]  Mental Health Interventions   Mental Health Discussed/Reviewed Mental Health Reviewed, Coping Strategies             Our next appointment is by telephone on 09/12/23 at 0930  Please call the care guide team at [phone] if you need to cancel or reschedule your appointment.   If you are experiencing a Mental Health or Behavioral Health Crisis or need someone to talk to, please call the Suicide and Crisis Lifeline: 988 call the [place] National Suicide Prevention Lifeline: 218 357 7945 or TTY: (703)493-1062 TTY (604) 705-8598) to talk to a trained counselor call 1-800-273-TALK (toll free, 24 hour hotline) go to [facility] [address] [place] [phone]) call 911   Patient verbalizes understanding of instructions and care plan provided today and agrees to view in MyChart. Active MyChart status and patient understanding of how to access instructions and care plan via MyChart confirmed with patient.     The patient has been provided with contact information for the care management team and has been advised to call with any health related questions or concerns.   Ashtin Rosner L. Noelle Penner, RN, BSN, [facility]  VBCI Care Management Coordinator  [phone]  Fax: [phone]

## 2023-09-12 ENCOUNTER — Ambulatory Visit: Payer: Self-pay | Admitting: *Deleted

## 2023-09-12 NOTE — Patient Instructions (Signed)
Visit Information  Thank you for taking time to visit with me today. Please don't hesitate to contact me if I can be of assistance to you.   Following are the goals we discussed today:   Goals Addressed               This Visit's Progress     Patient Stated     Patient will obtain a stretcher transport visit to wound care center + out of county [facility] transportation as needed, Assistance with [employer] hospital visits [facility]) (pt-stated)   On track     New goal- Patient will have healing of his fistula opening  Patient will continue to make attempts to get needed transportation to attend his wound care, pain management and urology visits as scheduled with available transportation- Updated He has not missed an appointment since ones that had to be rescheduled while inpatient for urology surgery (not in his control) - those were rescheduled-  Patient will continue to have home aide complete sacral and bladder wound care- Noted wound healing during recent clinic visit- MEETING GOAL  Patient will receive a mattress for his hospital bed - Pending Patient will get assistance to repair/convert his Zenaida Niece to a usable transport option. 08/28/23 Pending   Interventions Today    Flowsheet Row Most Recent Value  Chronic Disease   Chronic disease during today's visit Other  [transportation to 09/13[facility] & 09/24/23 Dr [name], urology appointments, confirmed he is awaiting transportation to his dentist]  General Interventions   General Interventions Discussed/Reviewed General Interventions Reviewed, Doctor Visits, [facility], Communication with  Doctor Visits Discussed/Reviewed Specialist, Doctor Visits Reviewed  PCP/Specialist Visits Compliance with follow-up visit  Communication with PCP/Specialists  [name] to [facility] safe ride staff Ryan & Non emergency [facility] staff to schedule rides and updated patient]             Our  next appointment is by telephone on 10/25/23 at 1115  Please call the care guide team at [phone] if you need to cancel or reschedule your appointment.   If you are experiencing a Mental Health or Behavioral Health Crisis or need someone to talk to, please call the Suicide and Crisis Lifeline: 988 call the [place] National Suicide Prevention Lifeline: (519) 573-2585 or TTY: 786-869-5939 TTY 251-023-9315) to talk to a trained counselor call 1-800-273-TALK (toll free, 24 hour hotline) go to [facility] [address] [place] [phone]) call 911   Patient verbalizes understanding of instructions and care plan provided today and agrees to view in MyChart. Active MyChart status and patient understanding of how to access instructions and care plan via MyChart confirmed with patient.     The patient has been provided with contact information for the care management team and has been advised to call with any health related questions or concerns.     Samanvitha Germany L. Noelle Penner, RN, BSN, [facility]  VBCI Care Management Coordinator  [phone]  Fax: [phone]

## 2023-09-12 NOTE — Patient Outreach (Signed)
  Care Coordination   Follow Up Visit Note   09/12/2023 Name: Mourad Lamanna MRN: [id number] DOB: [birth year]  Halford Kluttz is a 57 y.o. year old male who sees [name], [place] for primary care. I spoke with  Preet Jipson by phone today after scheduling his transportation for his 09/13/23 & 09/24/23.  What matters to the patients health and wellness today?  Transportation for 09/13/23 plastic surgery visit with Earle Gell a [facility] hospital + his 09/24/23 follow up visit to Dr [name] at [facility]  Confirmation numbers to [id number] & from [id number]  He has 34 remaining rides = 17 round trip rides  Mr Comar is awaiting his [employer] ride to go to his dentist today  Goals Addressed               This Visit's Progress     Patient Stated     Patient will obtain a stretcher transport visit to wound care center + out of county [facility] transportation as needed, Assistance with [employer] hospital visits [facility]) (pt-stated)   On track     New goal- Patient will have healing of his fistula opening  Patient will continue to make attempts to get needed transportation to attend his wound care, pain management and urology visits as scheduled with available transportation- Updated He has not missed an appointment since ones that had to be rescheduled while inpatient for urology surgery (not in his control) - those were rescheduled-  Patient will continue to have home aide complete sacral and bladder wound care- Noted wound healing during recent clinic visit- MEETING GOAL  Patient will receive a mattress for his hospital bed - Pending Patient will get assistance to repair/convert his Zenaida Niece to a usable transport option. 08/28/23 Pending   Interventions Today    Flowsheet Row Most Recent Value  Chronic Disease   Chronic disease during today's visit Other  [transportation to 09/13/23 Dr [facility] Part [facility] & 09/24/23 Dr [name], urology  appointments, confirmed he is awaiting transportation to his dentist]  General Interventions   General Interventions Discussed/Reviewed General Interventions Reviewed, Doctor Visits, [facility], Communication with  Doctor Visits Discussed/Reviewed Specialist, Doctor Visits Reviewed  PCP/Specialist Visits Compliance with follow-up visit  Communication with PCP/Specialists  [name] to [facility] safe ride staff Ryan & Non emergency [facility] staff to schedule rides and updated patient]             SDOH assessments and interventions completed:  No     Care Coordination Interventions:  Yes, provided   Follow up plan: Follow up call scheduled for 10/24/22    Encounter Outcome:  Patient Visit Completed   Cala Bradford L. Noelle Penner, RN, BSN, [facility]  VBCI Care Management Coordinator  [phone]  Fax: [phone]

## 2023-09-13 DIAGNOSIS — L89314 Pressure ulcer of right buttock, stage 4: Secondary | ICD-10-CM | POA: Diagnosis not present

## 2023-09-13 DIAGNOSIS — L89313 Pressure ulcer of right buttock, stage 3: Secondary | ICD-10-CM | POA: Diagnosis not present

## 2023-09-14 ENCOUNTER — Ambulatory Visit: Payer: Self-pay | Admitting: *Deleted

## 2023-09-14 NOTE — Patient Instructions (Signed)
Visit Information  Thank you for taking time to visit with me today. Please don't hesitate to contact me if I can be of assistance to you.   [facility] Patient Experience Office  [phone]   Following are the goals we discussed today:   Goals Addressed               This Visit's Progress     Patient Stated     Patient will obtain a stretcher transport visit to wound care center + out of county [facility] transportation as needed, Assistance with Zenaida Niece conversiondecrease hospital visits [facility]) (pt-stated)   Not on track     New goal- Patient will have healing of his fistula opening  Patient will continue to make attempts to get needed transportation to attend his wound care, pain management and urology visits as scheduled with available transportation- Updated He has not missed an appointment since ones that had to be rescheduled while inpatient for urology surgery (not in his control) - those were rescheduled-  Patient will continue to have home aide complete sacral and bladder wound care- Noted wound healing during recent clinic visit- MEETING GOAL  Patient will receive a mattress for his hospital bed - Pending Patient will get assistance to repair/convert his Zenaida Niece to a usable transport option. 08/28/23 Pending   Interventions Today    Flowsheet Row Most Recent Value  Chronic Disease   Chronic disease during today's visit Other  [transportation to 09/13/23 Dr Dawn[facility] Part [facility] & 09/24/23 Dr [name], urology appointments, confirmed he is awaiting transportation to his dentist]  General Interventions   General Interventions Discussed/Reviewed General Interventions Reviewed, Doctor Visits, [facility], Communication with  Doctor Visits Discussed/Reviewed Specialist, Doctor Visits Reviewed  PCP/Specialist Visits Compliance with follow-up visit  Communication with PCP/Specialists  [name] to [facility] safe ride staff Ryan & Non emergency  [facility] staff to schedule rides and updated patient]             Our next appointment is by telephone on 10/25/23 at 1:15 pm  Please call the care guide team at [phone] if you need to cancel or reschedule your appointment.   If you are experiencing a Mental Health or Behavioral Health Crisis or need someone to talk to, please call the Suicide and Crisis Lifeline: 988 call the [place] National Suicide Prevention Lifeline: 908-735-1756 or TTY: (334) 615-1957 TTY (580)719-7918) to talk to a trained counselor call 1-800-273-TALK (toll free, 24 hour hotline) go to [facility] Urgent Care [address] [place] [phone]) call 911   Patient verbalizes understanding of instructions and care plan provided today and agrees to view in MyChart. Active MyChart status and patient understanding of how to access instructions and care plan via MyChart confirmed with patient.     The patient has been provided with contact information for the care management team and has been advised to call with any health related questions or concerns.   [name] Rolf L. Noelle Penner, RN, BSN, [facility]  VBCI Care Management Coordinator  [phone]  Fax: [phone]

## 2023-09-14 NOTE — Patient Outreach (Signed)
Care Coordination   Follow Up Visit Note   09/14/2023 Name: [name] MRN: [id number] DOB: 1966-01-08  [name] is a 57 y.o. year old male who sees [name], [place] for primary care. I spoke with  [name] Retail buyer by phone today.  What matters to the patients health and wellness today?  Wound care concern after 09/13/23 wound care center visit  Jayce called RN CM to voice concern about his 09/13/23 visit to [facility]  Patient reported in the last few weeks his pelvis/hip wounds have look very good, and was healed He reports the wound care nurses praised him that his wound was healed Mr Formella noticed the nurses did not complete any measurements or pictures  Mr Bayes voices concern that a new wound was created during his 09/13/23 office visit by "a doctor I have never seen before" "the doctor was in the room by himself. There was not any nurses or assistant in the room like usual" He reports the provider used a cu-tip and created an opening that was not there upon arrival Mr Mcfarling had the MD to take a picture with Mr Living' personal mobile phone of the new left wound site  Mr Isip questioned the MD about this new wound site. Mr Dempewolf called and spoke with nurses at the center today, 09/14/23 about his concerns. He reported being placed on hold until the call was disconnected [facility] staff  also reported his wounds were healed during his last hospitalization 07/31/23-08/20/23. Mr Nabor' aide [name] confirmed today when Mr Sikich' dressing was changed prior to the 09/13/23 office visit and he did not notice a new wound.  Mr Esparza is reported to look at the dressing that was removed and confirms no drainage on the ABD pad. He ventilated his frustration related to not having a wound before the appointment but having one when leaving the wound care center. He reports disappointment with having another unexpected  wound and the services of Dr Dawna Part  In Dr Levada Dy 07/26/23 note he does not indicate any open wounds, Dr Nelda Severe is usually his provider at the wound care center  Patient agrees not to be seen by any other wound care provider but Dr Nelda Severe Completed a conference with Korrey to [facility] patient relations office at [phone] to leave a message requesting a return call   Needs to have a wisdom tooth removed and need the contact information as he lost the appointment card. Conference call to the [facility] [employer] dental office but it was closed   Goals Addressed               This Visit's Progress     Patient Stated     Patient will obtain a stretcher transport visit to wound care center + out of county [facility] transportation as needed, Assistance with [employer] hospital visits [facility]) (pt-stated)   Not on track     New goal- Patient will have healing of his fistula opening  Patient will continue to make attempts to get needed transportation to attend his wound care, pain management and urology visits as scheduled with available transportation- Updated He has not missed an appointment since ones that had to be rescheduled while inpatient for urology surgery (not in his control) - those were rescheduled-  Patient will continue to have home aide complete sacral and bladder wound care- Noted wound healing during recent clinic visit- MEETING GOAL  Patient will receive a mattress  for his hospital bed - Pending Patient will get assistance to repair/convert his Zenaida Niece to a usable transport option. 08/28/23 Pending   Interventions Today    Flowsheet Row Most Recent Value  Chronic Disease   Chronic disease during today's visit Other  [wound care, completion of calls to patient relation & dental office, completion of email to patient relations at [facility] on patient behalf]  General Interventions   General Interventions Discussed/Reviewed General Interventions  Reviewed, Communication with, [employer], Doctor Visits  Doctor Visits Discussed/Reviewed Doctor Visits Discussed, Specialist  PCP/Specialist Visits Compliance with follow-up visit  Communication with PCP/Specialists  Mental Health Interventions   Mental Health Discussed/Reviewed Mental Health Reviewed, Other  [frustration & disappointment expressed]             SDOH assessments and interventions completed:  No     Care Coordination Interventions:  Yes, provided   Follow up plan: Follow up call scheduled for 10/25/23    Encounter Outcome:  Patient Visit Completed   Cala Bradford L. Noelle Penner, RN, BSN, [facility]  VBCI Care Management Coordinator  [phone]  Fax: [phone]

## 2023-09-17 ENCOUNTER — Ambulatory Visit: Payer: Self-pay | Admitting: *Deleted

## 2023-09-17 NOTE — Patient Outreach (Signed)
  Care Coordination   Collaboration with dental staff  Visit Note   09/17/2023 Name: [name] MRN: [id number] DOB: 1966/05/08  [name] is a 57 y.o. year old male who sees [name], [place] for primary care. I  spoke with Serina Cowper at [facility] dental office when she called RN CM   What matters to the patients health and wellness today?  Dental care, referrals x 2, presentation & transportation of patent to dental/oral care visits  Ramal will need stretchers for all dental services 2 referrals orthodontic okay with the exam in the motorized wheelchair or   Still pending approval of last visit cost    Goals Addressed               This Visit's Progress     Patient Stated     Patient will obtain a stretcher transport visit to wound care center + out of county [facility] transportation as needed, Assistance with [employer] hospital visits [facility]) (pt-stated)        New goal- Patient will have healing of his fistula opening  Patient will continue to make attempts to get needed transportation to attend his wound care, pain management and urology visits as scheduled with available transportation- Updated He has not missed an appointment since ones that had to be rescheduled while inpatient for urology surgery (not in his control) - those were rescheduled-  Patient will continue to have home aide complete sacral and bladder wound care- Noted wound healing during recent clinic visit- MEETING GOAL  Patient will receive a mattress for his hospital bed - Pending Patient will get assistance to repair/convert his Zenaida Niece to a usable transport option. 08/28/23 Pending   Interventions Today    Flowsheet Row Most Recent Value  Chronic Disease   Chronic disease during today's visit Other  [Dental services,  Collaborated with Alisa from [facility] related to patient future 2 referrals and presnting to dental office with difficulty transferring him to the dental  chair- Suggested arriving via PTAR stretcher]  General Interventions   General Interventions Discussed/Reviewed General Interventions Reviewed, Health Screening, Communication with, Doctor Visits, [employer]  [received a call from [place] after a message left last week. She discussed needing to have Patient transferred to a dental chair during all upcoming dental visits. Serina Cowper will email patient his referral information]  Doctor Visits Discussed/Reviewed Doctor Visits Reviewed, Specialist  PCP/Specialist Visits Compliance with follow-up visit  Communication with PCP/Specialists  [discussed patient dental care, presentation, transport change to arrive at dental office and other oral care providers in future comfirmed patient will be sent an email to remind him of his 2 referral information]  Safety Interventions   Safety Discussed/Reviewed Safety Reviewed, Fall Risk             SDOH assessments and interventions completed:  No     Care Coordination Interventions:  Yes, provided   Follow up plan: Follow up call scheduled for 10/24/23    Encounter Outcome:  Patient Visit Completed   Cala Bradford L. Noelle Penner, RN, BSN, [facility]  VBCI Care Management Coordinator  [phone]  Fax: [phone]

## 2023-09-18 ENCOUNTER — Ambulatory Visit: Payer: Self-pay | Admitting: *Deleted

## 2023-09-18 NOTE — Patient Outreach (Signed)
Care Coordination   Call from patient  Visit Note   09/18/2023 Name: [name] MRN: [id number] DOB: 1966-06-05  [name] is a 57 y.o. year old male who sees [name], [place] for primary care. I spoke with  [name] by phone today.  What matters to the patients health and wellness today?  Appointments and transportation (pcp, pain management, dental extraction wound care) My chart access assistance, wound healing, patient relations return call, van services, updated patient on call from [facility] staff on 09/17/23   He will be seen at [facility] dental office at [address] for his wisdom tooth extraction on 11/06/23 at 3 pm [phone]    With a conference call to his pcp office [phone] he was assisted to follow up on and make an appointment for Friday 09/21/23 at 1 pm with Britta Mccreedy golden for pain management (covering for Dr Jenne Pane)    Conference call to [facility] safe ride- [phone] direct line to safe ride to schedule transportation for 09/21/23  [phone] & [phone] confirmation  He has a remaining [address] way ride left for 2024   Personal Zenaida Niece repairs- no calls related to [facility] repairs since [facility] hospital discharge   Goals Addressed               This Visit's Progress     Patient Stated     Patient will obtain a stretcher transport visit to wound care center + out of county [facility] transportation as needed, Assistance with [employer] hospital visits [facility]) (pt-stated)   On track     New goal- Patient will have healing of his fistula opening- 09/18/23 Healing  Patient will continue to make attempts to get needed transportation to attend his wound care, pain management and urology visits as scheduled with available transportation- Updated He has not missed an appointment since ones that had to be rescheduled while inpatient for urology surgery (not in his control) - those were rescheduled-  Patient will  continue to have home aide complete sacral and bladder wound care- Noted wound healing during recent clinic visit- 09/14/23  by wound nurses but not by covering provider  Patient will receive a mattress for his hospital bed - Pending Patient will get assistance to repair/convert his Zenaida Niece to a usable transport option. 09/18/23 Pending    Interventions Today    Flowsheet Row Most Recent Value  Chronic Disease   Chronic disease during today's visit Other  [Appointments and transportation (pcp, pain management, dental extraction wound care) My chart access assistance, wound healing, patient relations return call, Zenaida Niece services, updated patient on call from [facility] staff on 09/17/23]  General Interventions   General Interventions Discussed/Reviewed General Interventions Reviewed, [employer], Communication with, Doctor Visits  Doctor Visits Discussed/Reviewed Doctor Visits Reviewed, Specialist  PCP/Specialist Visits Compliance with follow-up visit  Communication with PCP/Specialists  [outreach to pain management/pcp to get a 09/21/23 pain management appointment Called [employer] safe ride to schedule transportation to this appointment]  Exercise Interventions   Exercise Discussed/Reviewed Exercise Reviewed, Physical Activity  Physical Activity Discussed/Reviewed Physical Activity Reviewed  Education Interventions   Education Provided Provided Education  [my chart access Provided his username & encouraged him to click forgot password to create a new one]  Provided Verbal Education On [employer], Development worker, community  Mental Health Interventions   Mental Health Discussed/Reviewed Mental Health Reviewed, Coping Strategies  SDOH assessments and interventions completed:  No     Care Coordination Interventions:  Yes, provided   Follow up plan: Follow up call scheduled for 10/24/23    Encounter Outcome:  Patient Visit Completed   Cala Bradford L. Noelle Penner, RN,  BSN, [facility]  VBCI Care Management Coordinator  [phone]  Fax: [phone]

## 2023-09-18 NOTE — Patient Instructions (Addendum)
Visit Information  Thank you for taking time to visit with me today. Please don't hesitate to contact me if I can be of assistance to you.   Following are the goals we discussed today:   Goals Addressed               This Visit's Progress     Patient Stated     Patient will obtain a stretcher transport visit to wound care center + out of county [facility] transportation as needed, Assistance with [employer] hospital visits [facility]) (pt-stated)   On track     New goal- Patient will have healing of his fistula opening- 09/18/23 Healing  Patient will continue to make attempts to get needed transportation to attend his wound care, pain management and urology visits as scheduled with available transportation- Updated He has not missed an appointment since ones that had to be rescheduled while inpatient for urology surgery (not in his control) - those were rescheduled-  Patient will continue to have home aide complete sacral and bladder wound care- Noted wound healing during recent clinic visit- 09/14/23  by wound nurses but not by covering provider  Patient will receive a mattress for his hospital bed - Pending Patient will get assistance to repair/convert his Zenaida Niece to a usable transport option. 09/18/23 Pending    Interventions Today    Flowsheet Row Most Recent Value  Chronic Disease   Chronic disease during today's visit Other  [Appointments and transportation (pcp, pain management, dental extraction wound care) My chart access assistance, wound healing, patient relations return call, Zenaida Niece services, updated patient on call from [facility] staff on 09/17/23]  General Interventions   General Interventions Discussed/Reviewed General Interventions Reviewed, [employer], Communication with, Doctor Visits  Doctor Visits Discussed/Reviewed Doctor Visits Reviewed, Specialist  PCP/Specialist Visits Compliance with follow-up visit  Communication with PCP/Specialists   [outreach to pain management/pcp to get a 09/21/23 pain management appointment Called [employer] safe ride to schedule transportation to this appointment]  Exercise Interventions   Exercise Discussed/Reviewed Exercise Reviewed, Physical Activity  Physical Activity Discussed/Reviewed Physical Activity Reviewed  Education Interventions   Education Provided Provided Education  [my chart access Provided his username & encouraged him to click forgot password to create a new one]  Provided Verbal Education On [employer], Insurance Plans  Mental Health Interventions   Mental Health Discussed/Reviewed Mental Health Reviewed, Coping Strategies                Our next appointment is by telephone on 10/24/23 at 0845  Please call the care guide team at [phone] if you need to cancel or reschedule your appointment.   If you are experiencing a Mental Health or Behavioral Health Crisis or need someone to talk to, please call the Suicide and Crisis Lifeline: 988 call the [place] National Suicide Prevention Lifeline: (520)189-9378 or TTY: 2243316770 TTY 215-384-8512) to talk to a trained counselor call 1-800-273-TALK (toll free, 24 hour hotline)   Patient verbalizes understanding of instructions and care plan provided today and agrees to view in MyChart. Active MyChart status and patient understanding of how to access instructions and care plan via MyChart confirmed with patient.     The patient has been provided with contact information for the care management team and has been advised to call with any health related questions or concerns.   Cleveland Paiz L. Noelle Penner, RN, BSN, [facility]  VBCI Care Management Coordinator  [phone]  Fax: [phone]

## 2023-09-21 DIAGNOSIS — G8221 Paraplegia, complete: Secondary | ICD-10-CM | POA: Diagnosis not present

## 2023-09-21 DIAGNOSIS — Z79899 Other long term (current) drug therapy: Secondary | ICD-10-CM | POA: Diagnosis not present

## 2023-09-21 DIAGNOSIS — R7302 Impaired glucose tolerance (oral): Secondary | ICD-10-CM | POA: Diagnosis not present

## 2023-09-21 DIAGNOSIS — M545 Low back pain, unspecified: Secondary | ICD-10-CM | POA: Diagnosis not present

## 2023-09-24 DIAGNOSIS — G8220 Paraplegia, unspecified: Secondary | ICD-10-CM | POA: Diagnosis not present

## 2023-09-24 DIAGNOSIS — Z435 Encounter for attention to cystostomy: Secondary | ICD-10-CM | POA: Diagnosis not present

## 2023-09-24 DIAGNOSIS — S34101S Unspecified injury to L1 level of lumbar spinal cord, sequela: Secondary | ICD-10-CM | POA: Diagnosis not present

## 2023-09-24 DIAGNOSIS — N319 Neuromuscular dysfunction of bladder, unspecified: Secondary | ICD-10-CM | POA: Diagnosis not present

## 2023-09-24 DIAGNOSIS — R339 Retention of urine, unspecified: Secondary | ICD-10-CM | POA: Diagnosis not present

## 2023-09-25 ENCOUNTER — Ambulatory Visit: Payer: Self-pay | Admitting: *Deleted

## 2023-09-25 NOTE — Patient Outreach (Signed)
Care Coordination   Follow Up Visit Note   09/25/2023 Name: [name] MRN: [id number] DOB: 09-Aug-1966  [name] is a 57 y.o. year old male who sees [name], [place] for primary care. I spoke with  [name] by phone today.  What matters to the patients health and wellness today?  Message from patient stating he received a dismissal from the [facility] in [facility] [place] and feels this is in retaliation after he made his grievance and It is his right to file a grievance without being kicked out of the center. "I told them I was not satisfied with the letter and wanted to have my questions answered about being told I was hostile' [name] wants to know if he was hostile why the staff did not report him as being hostile before today 09/25/23 Reports he was informed today that Dr Nelda Severe does not want to provide further services because "they said I was hostile to his staff when I was leaving out of the wound center" Patient denies being hostile to anyone on 09/13/23 and refers back to his 2 male ambulance PTAR staff members being present. He reports he was told that Dr Nelda Severe (not Dr Dawna Part) denies to provide service to him. He was informed he has 30 days to find another wound care center   [name] does not prefer to be seen at the [facility] health wound care center  Recommend patient speak further with the [facility] patient/family relations male staff he has been speaking with recently and the wound care center director. He has already done. [name] read the letter he received this week from the [facility] patient/family relations that does not state he was hostile nor being dismiss Legal aid services discussed as a more cost efficient [name] would prefer to remain with [place] care center with Dr Nelda Severe providing services, not to be seen by Dr Dawna Part again.  [name] reports he and his home aide have completed home wound  care  changes and on today are not able to find the opened area found on 09/13/23 He and RN CM called to attempt to speak with the Director of the wound care center.  Message was left requesting a return call to RN CM and discussing [name] preference for the grievance to be acknowledged & to continue to receive services from Dr Nelda Severe but not Dr Dawna Part.   Goals Addressed               This Visit's Progress     Patient Stated     Patient will obtain a stretcher transport visit to wound care center + out of county [facility] transportation as needed, Assistance with [employer] hospital visits [facility]) (pt-stated)   Not on track     New goal- Patient will have healing of his fistula opening- 09/18/23 Healing  Patient will continue to make attempts to get needed transportation to attend his wound care, pain management and urology visits as scheduled with available transportation- Updated He has not missed an appointment since ones that had to be rescheduled while inpatient for urology surgery (not in his control) - those were rescheduled-  Patient will continue to have home aide complete sacral and bladder wound care- Noted wound healing during recent clinic visit- 09/14/23  by wound nurses but not by covering provider  Patient will receive a mattress for his hospital bed - Pending Patient will get assistance to repair/convert  his Zenaida Niece to a usable transport option. 09/18/23 Pending    Interventions Today    Flowsheet Row Most Recent Value  Chronic Disease   Chronic disease during today's visit Other  [wound care center dismissal post grievance from 09/13/23 office visit]  General Interventions   General Interventions Discussed/Reviewed General Interventions Reviewed, [employer], Doctor Visits, Communication with  Doctor Visits Discussed/Reviewed Doctor Visits Reviewed, Specialist  PCP/Specialist Visits Compliance with follow-up visit  Communication with  PCP/Specialists, RN  Education Interventions   Education Provided Provided Education  Provided Verbal Education On Other  [reviewed differences in hospital systems grievance, complaint processes]  Mental Health Interventions   Mental Health Discussed/Reviewed Coping Strategies, Mental Health Reviewed                SDOH assessments and interventions completed:  No     Care Coordination Interventions:  Yes, provided   Follow up plan: Follow up call scheduled for 10/24/23    Encounter Outcome:  Patient Visit Completed   [name] Bradford L. Noelle Penner, RN, BSN, [facility]  VBCI Care Management Coordinator  [phone]  Fax: [phone]

## 2023-09-25 NOTE — Patient Instructions (Signed)
Visit Information  Thank you for taking time to visit with me today. Please don't hesitate to contact me if I can be of assistance to you.   Following are the goals we discussed today:   Goals Addressed               This Visit's Progress     Patient Stated     Patient will obtain a stretcher transport visit to wound care center + out of county [facility] transportation as needed, Assistance with [employer] hospital visits [facility]) (pt-stated)   Not on track     New goal- Patient will have healing of his fistula opening- 09/18/23 Healing  Patient will continue to make attempts to get needed transportation to attend his wound care, pain management and urology visits as scheduled with available transportation- Updated He has not missed an appointment since ones that had to be rescheduled while inpatient for urology surgery (not in his control) - those were rescheduled-  Patient will continue to have home aide complete sacral and bladder wound care- Noted wound healing during recent clinic visit- 09/14/23  by wound nurses but not by covering provider  Patient will receive a mattress for his hospital bed - Pending Patient will get assistance to repair/convert his Zenaida Niece to a usable transport option. 09/18/23 Pending    Interventions Today    Flowsheet Row Most Recent Value  Chronic Disease   Chronic disease during today's visit Other  [wound care center dismissal post grievance from 09/13/23 office visit]  General Interventions   General Interventions Discussed/Reviewed General Interventions Reviewed, [employer], Doctor Visits, Communication with  Doctor Visits Discussed/Reviewed Doctor Visits Reviewed, Specialist  PCP/Specialist Visits Compliance with follow-up visit  Communication with PCP/Specialists, RN  Education Interventions   Education Provided Provided Education  Provided Verbal Education On Other  [reviewed differences in hospital systems grievance,  complaint processes]  Mental Health Interventions   Mental Health Discussed/Reviewed Coping Strategies, Mental Health Reviewed                Our next appointment is by telephone on 10/24/23 at 0845  Please call the care guide team at [phone] if you need to cancel or reschedule your appointment.   If you are experiencing a Mental Health or Behavioral Health Crisis or need someone to talk to, please call the Suicide and Crisis Lifeline: 988 call the [place] National Suicide Prevention Lifeline: 336-332-2957 or TTY: 715-068-8537 TTY (660)883-2441) to talk to a trained counselor call 1-800-273-TALK (toll free, 24 hour hotline) go to [facility] [address], [place] [phone]) call 911   Patient verbalizes understanding of instructions and care plan provided today and agrees to view in MyChart. Active MyChart status and patient understanding of how to access instructions and care plan via MyChart confirmed with patient.     The patient has been provided with contact information for the care management team and has been advised to call with any health related questions or concerns.   Jayon Matton L. Noelle Penner, RN, BSN, [facility]  VBCI Care Management Coordinator  [phone]  Fax: [phone]

## 2023-10-08 ENCOUNTER — Ambulatory Visit: Payer: Self-pay | Admitting: *Deleted

## 2023-10-09 NOTE — Patient Outreach (Signed)
 Care Coordination   Follow Up Visit Note   10/24/2023 updated note for 10/08/22 Name: [name] MRN: [id number] DOB: 28-Jan-1966  [name] is a 58 y.o. year old male who sees Carlie Rogelia LABOR, PA-C for primary care. I spoke with  Mickie Retail Buyer by phone today.  What matters to the patients health and wellness today?   Wound care supplies, concern with discrepancies related to the result of the grievance filed after wound care treatment visit on 09/13/23 Patient states he feels his wound care center dismissal is in retaliation after he filed a grievance related to his last wound care services that he feels lead to the development of a wound that was not present upon his arrival to the wound care center.  Patient continues to question/request an answer from the wound care staff/grievance department related to how three nurses were not able to find an open wound & drainage, the nurses did not take measurements or pictures of any wound and were not present when the 09/13/23 provider was in the room. Patient continues to state he was not verbally aggressive to any of the wound care staff on 09/13/23. Patient confirms there was not any grievance filed by the ambulance staff who transported him to and from the wound care center. He confirms he did not have a conversation with the EMS staff about any of the 09/13/23 wound care treatment. He reports he is aware that if any verbal aggression was noted by the EMS staff there should be an EMS report or documentation  Mr Mccaughey reports he has never had any confrontations or verbally aggressive towards any of the [facility] prior to nor on  09/13/23.  He received the [facility] [place] office for Legal aid [phone]   Goals Addressed               This Visit's Progress     Patient Stated     Patient will obtain a stretcher transport visit to wound care center + out of county [facility] transportation as needed,  Assistance with fleeta conversiondecrease hospital visits [facility]) (pt-stated)        New goal- Patient will have healing of his fistula opening- 09/18/23 Healing  Patient will continue to make attempts to get needed transportation to attend his wound care, pain management and urology visits as scheduled with available transportation- Updated He has not missed an appointment since ones that had to be rescheduled while inpatient for urology surgery (not in his control) - those were rescheduled-  Patient will continue to have home aide complete sacral and bladder wound care- Noted wound healing during recent clinic visit- 09/14/23  by wound nurses but not by covering provider  Patient will receive a mattress for his hospital bed - Pending Patient will get assistance to repair/convert his fleeta to a usable transport option. 09/18/23 Pending    Interventions Today    Flowsheet Row Most Recent Value  Chronic Disease   Chronic disease during today's visit Other  [collaboration with wound care center for wound care supplies, response from Jenn that an order was sent for patient wound care supplies.  Updated patient. Listened to patient read his recent 10/01/23 letter from [facility] grievance compliance dept]  General Interventions   General Interventions Discussed/Reviewed General Interventions Reviewed, Communication with, Durable Medical Equipment (DME)  Doctor Visits Discussed/Reviewed Doctor Visits Reviewed, Specialist  Durable Medical Equipment (DME) Other  [wound care supplies]  PCP/Specialist Visits Compliance with follow-up visit  Contacted  provider for referral to Specialist, PCP  wayna topcp Dr Carlie for a wound care center referreal  Spokew ith Harlene at the office]  Communication with PCP/Specialists  [Received a call back from Jenn at the wound care clinic]  Education Interventions   Education Provided Provided Education  Provided Verbal Education On [employer]  [provided  number for Legal aid of [place] [phone]]  Mental Health Interventions   Mental Health Discussed/Reviewed Mental Health Reviewed, Coping Strategies                SDOH assessments and interventions completed:  No     Care Coordination Interventions:  Yes, provided   Follow up plan: Follow up call scheduled for 10/24/23    Encounter Outcome:  Patient Visit Completed    Suzen L. Ramonita, RN, BSN, [facility]  VBCI Care Management Coordinator  [phone]  Fax: [phone]

## 2023-10-24 ENCOUNTER — Encounter: Payer: Self-pay | Admitting: *Deleted

## 2023-10-24 NOTE — Patient Instructions (Signed)
Visit Information  Thank you for taking time to visit with me today. Please don't hesitate to contact me if I can be of assistance to you.   Following are the goals we discussed today:   Goals Addressed   None     Our next appointment is  ?pending with united healthcare care manager  on ? at ?  Please call the care guide team at [phone] if you need to cancel or reschedule your appointment.   If you are experiencing a Mental Health or Behavioral Health Crisis or need someone to talk to, please call the Suicide and Crisis Lifeline: 988 call the [place] National Suicide Prevention Lifeline: 231-564-2321 or TTY: 412-665-0983 TTY 250-569-0242) to talk to a trained counselor call 1-800-273-TALK (toll free, 24 hour hotline) go to [facility] [address] [place] [phone]) call 911   Patient verbalizes understanding of instructions and care plan provided today and agrees to view in MyChart. Active MyChart status and patient understanding of how to access instructions and care plan via MyChart confirmed with patient.     The patient has been provided with contact information for the care management team and has been advised to call with any health related questions or concerns.   Prajwal Fellner L. Noelle Penner, RN, BSN, CCM [place]  [facility], [facility] RN Care Manager Direct Dial: [phone]  Fax: [phone] Mailing Address: [address]  [place] [address] Website: [email or web address]

## 2023-10-24 NOTE — Patient Outreach (Signed)
Care Coordination   Follow Up Visit Note   10/25/2023 updated note for 10/24/23 Name: [name] MRN: [id number] DOB: 01-17-1966  [name] is a 58 y.o. year old male who sees [name], [place] for primary care. I spoke with  [name] by phone today.  What matters to the patients [name] and wellness today?  Wound care supplies from [facility],   Patient voiced understanding of RN CM's unsuccessful outreach to [name] urology. (Office closed) He confirms he called the [name] urology office on 10/22/22 and was informed by the staff that the home [name] referral was sent to [name] [name] and another message would be sent to [name] for request to return a call to him related to further information about a plan of care, follow up visits and care of his fistula (office visit or home care). He reports the fistula opening site is healing and closing well with the home care provided by his home aid He voices understanding that his [name] provider notes indicated outreach to [name] and the referral conclusion states "[name] can not see patient dur to unable to staff in [place]." He voiced concern related to the [facility] urology office not attempting to send a referral to other home healthcare agencies after this response to find an agency that could help provide home [name] services in [name]. His continues to remain covered by united healthcare [facility]) but reports he upgraded his insurance plan. In previous years, there was noted issues with obtaining home care services under his united healthcare plan. Agrees for RN CM to speak with [name] leadership about these concerns and to see if there is a possible UHC contact/representative to assist with home [name] services for fistula wound care +.  He reports no answer from [name] legal aid after leaving a message  He was not able to find anyone to help with his [name] repair from 2024 attempts.  He still has united healthcare medicare transportation benefits.    Goals Addressed               This Visit's Progress     Patient Stated     Patient will obtain a stretcher transport visit to wound care center + out of county [name] transportation as needed, Assistance with [employer] [name] visits [name]) (pt-stated)   Not on track     New goal- Patient will have healing of his fistula opening- 09/18/23 Healing  Patient will continue to make attempts to get needed transportation to attend his wound care, pain management and urology visits as scheduled with available transportation- Updated He has not missed an appointment since ones that had to be rescheduled while inpatient for urology surgery (not in his control) - those were rescheduled-  Patient will continue to have home aide complete sacral and bladder wound care- Noted wound healing during recent clinic visit- 09/14/23  by wound nurses but not by covering provider  Patient will receive a mattress for his [name] bed - Pending Patient will get assistance to repair/convert his [name] to a usable transport option. 09/18/23 Pending    Interventions Today    Flowsheet Row Most Recent Value  Chronic Disease   Chronic disease during today's visit Other  [outreach to [name] urology to check on patient plan of care. No answer. Office closed per voice message]  General Interventions   General Interventions Discussed/Reviewed General Interventions Reviewed, Doctor Visits, Communication with  Doctor Visits Discussed/Reviewed Doctor  Visits Reviewed, Specialist  Durable [name] Equipment (DME) Other  [wound supplies]  PCP/Specialist Visits Compliance with follow-up visit  Communication with PCP/Specialists, RN  [name] to urology ofice prior to outreach to patient, outreach to [employer] staff for collaboration/assist]  Education Interventions   Education Provided Provided Education  [process for home [name] referrals,  wound care follow up]  Provided Verbal Education On [employer], [employer], When to see the doctor  Mental [name] Interventions   Mental [name] Discussed/Reviewed Mental [name] Reviewed, Coping Strategies                SDOH assessments and interventions completed:  Yes     Care Coordination Interventions:  Yes, provided   Follow up plan: No further intervention required. 10/25/23    Encounter Outcome:  Patient Visit Completed   Kayliana Codd L. Noelle Penner, RN, BSN, CCM [place]  [facility], [facility] Health RN Care Manager Direct Dial: [phone]  Fax: [phone] Mailing Address: [address]  [place] [address] Website: [email or web address]

## 2023-10-24 NOTE — Patient Instructions (Signed)
Visit Information  Thank you for taking time to visit with me today. Please don't hesitate to contact me if I can be of assistance to you.   Following are the goals we discussed today:   Goals Addressed               This Visit's Progress     Patient Stated     Patient will obtain a stretcher transport visit to wound care center + out of county [facility] transportation as needed, Assistance with [employer] hospital visits [facility]) (pt-stated)        New goal- Patient will have healing of his fistula opening- 09/18/23 Healing  Patient will continue to make attempts to get needed transportation to attend his wound care, pain management and urology visits as scheduled with available transportation- Updated He has not missed an appointment since ones that had to be rescheduled while inpatient for urology surgery (not in his control) - those were rescheduled-  Patient will continue to have home aide complete sacral and bladder wound care- Noted wound healing during recent clinic visit- 09/14/23  by wound nurses but not by covering provider  Patient will receive a mattress for his hospital bed - Pending Patient will get assistance to repair/convert his Zenaida Niece to a usable transport option. 09/18/23 Pending    Interventions Today    Flowsheet Row Most Recent Value  Chronic Disease   Chronic disease during today's visit Other  [collaboration with wound care center for wound care supplies, response from [place] that an order was sent for patient wound care supplies.  Updated patient. Listened to patient read his recent 10/01/23 letter from [facility] grievance compliance dept]  General Interventions   General Interventions Discussed/Reviewed General Interventions Reviewed, Communication with, Durable Medical Equipment (DME)  Doctor Visits Discussed/Reviewed Doctor Visits Reviewed, Specialist  Durable Medical Equipment (DME) Other  [wound care supplies]  PCP/Specialist Visits  Compliance with follow-up visit  Contacted provider for referral to Specialist, PCP  [name] topcp Dr Jenne Pane for a wound care center referreal  Spokew ith Shanda Bumps at the office]  Communication with PCP/Specialists  [Received a call back from [place] at the wound care clinic]  Education Interventions   Education Provided Provided Education  Provided Verbal Education On [employer]  [provided number for Legal aid of [place] [phone]]  Mental Health Interventions   Mental Health Discussed/Reviewed Mental Health Reviewed, Coping Strategies                Our next appointment is by telephone on 10/24/23 at 0845  Please call the care guide team at [phone] if you need to cancel or reschedule your appointment.   If you are experiencing a Mental Health or Behavioral Health Crisis or need someone to talk to, please call the Suicide and Crisis Lifeline: 988 call the [place] National Suicide Prevention Lifeline: 828-488-9146 or TTY: 319-851-8544 TTY (251)853-0470) to talk to a trained counselor call 1-800-273-TALK (toll free, 24 hour hotline) go to [facility] [address] [place] [phone]) call 911   Patient verbalizes understanding of instructions and care plan provided today and agrees to view in MyChart. Active MyChart status and patient understanding of how to access instructions and care plan via MyChart confirmed with patient.     The patient has been provided with contact information for the care management team and has been advised to call with any health related questions or concerns.   Joquan Lotz L. Noelle Penner, RN, BSN,  CCM [place]  [facility] Institute, [facility] Health RN Care Manager

## 2023-10-25 ENCOUNTER — Ambulatory Visit: Payer: Self-pay | Admitting: *Deleted

## 2023-10-25 NOTE — Patient Instructions (Addendum)
Visit Information  Thank you for taking time to visit with me today. Please don't hesitate to contact me if I can be of assistance to you.   Following are the goals we discussed today:   Goals Addressed               This Visit's Progress     Patient Stated     Patient will obtain a stretcher transport visit to wound care center + out of county [facility] transportation as needed, Assistance with [employer] hospital visits [facility]) (pt-stated)        New goal- Patient will have healing of his fistula opening- 09/18/23 Healing  Patient will continue to make attempts to get needed transportation to attend his wound care, pain management and urology visits as scheduled with available transportation- Updated He has not missed an appointment since ones that had to be rescheduled while inpatient for urology surgery (not in his control) - those were rescheduled-  Patient will continue to have home aide complete sacral and bladder wound care- Noted wound healing during recent clinic visit- 09/14/23  by wound nurses but not by covering provider  Patient will receive a mattress for his hospital bed - Pending Patient will get assistance to repair/convert his Zenaida Niece to a usable transport option. 09/18/23 Pending    Interventions Today    Flowsheet Row Most Recent Value  Chronic Disease   Chronic disease during today's visit Other  [updated patient on progression of obtaining UHC staff assist for in network home health for wound care as ordered by [facility] urology, RN CM services +]  General Interventions   General Interventions Discussed/Reviewed General Interventions Reviewed, [employer], Doctor Visits, Horticulturist, commercial (DME)  Doctor Visits Discussed/Reviewed Doctor Visits Reviewed, Specialist  Durable Medical Equipment (DME) Other  [wound care supplies]  PCP/Specialist Visits Compliance with follow-up visit  Education Interventions   Education Provided Provided  Education  Provided Verbal Education On [employer], [employer], Other  [pending assist from [facility] for in network home health services for wound care, rn cm services, follow up plan with urology +]                Our next appointment is by telephone on 11/05/23 at 9 am  Please call the care guide team at [phone] if you need to cancel or reschedule your appointment.   If you are experiencing a Mental Health or Behavioral Health Crisis or need someone to talk to, please call the Suicide and Crisis Lifeline: 988 call the [place] National Suicide Prevention Lifeline: 316-439-7378 or TTY: 850-363-8046 TTY (769)697-2833) to talk to a trained counselor call 1-800-273-TALK (toll free, 24 hour hotline) go to [facility] [address], [place] [phone]) call 911   Patient verbalizes understanding of instructions and care plan provided today and agrees to view in MyChart. Active MyChart status and patient understanding of how to access instructions and care plan via MyChart confirmed with patient.     The patient has been provided with contact information for the care management team and has been advised to call with any health related questions or concerns.   Adelita Hone L. Noelle Penner, RN, BSN, CCM [place]  [facility] Based Care Institute, [facility] Health RN Care Manager Direct Dial: [phone]  Fax: [phone] Mailing Address: [address]  [place] [address] Website: [email or web address]

## 2023-10-25 NOTE — Patient Outreach (Signed)
  Care Coordination   Follow Up Visit Note   10/25/2023 Name: [name] MRN: [id number] DOB: 08-May-1966  [name] is a 58 y.o. year old male who sees [name], [place] for primary care. I spoke with  Samule Retail buyer by phone today.  What matters to the patients health and wellness today?  Updated patient on RN CM interventions requesting assistance from an  [place] [facility]) staff to locate an in network home health services as ordered by [facility] urology for wound care services  Also discussed with him outreach to RN [employer] leadership with discussion of RN CM services for 2025 He voiced understanding that RN CM is awaiting email response for an united healthcare staff and update on RN CM services/plan     Goals Addressed               This Visit's Progress     Patient Stated     Patient will obtain a stretcher transport visit to wound care center + out of county [facility] transportation as needed, Assistance with [employer] hospital visits [facility]) (pt-stated)        New goal- Patient will have healing of his fistula opening- 09/18/23 Healing  Patient will continue to make attempts to get needed transportation to attend his wound care, pain management and urology visits as scheduled with available transportation- Updated He has not missed an appointment since ones that had to be rescheduled while inpatient for urology surgery (not in his control) - those were rescheduled-  Patient will continue to have home aide complete sacral and bladder wound care- Noted wound healing during recent clinic visit- 09/14/23  by wound nurses but not by covering provider  Patient will receive a mattress for his hospital bed - Pending Patient will get assistance to repair/convert his Zenaida Niece to a usable transport option. 09/18/23 Pending    Interventions Today    Flowsheet Row Most Recent Value  Chronic Disease   Chronic disease during today's visit Other  [updated patient  on progression of obtaining UHC staff assist for in network home health for wound care as ordered by [facility] urology, RN CM services +]  General Interventions   General Interventions Discussed/Reviewed General Interventions Reviewed, [employer], Doctor Visits, Horticulturist, commercial (DME)  Doctor Visits Discussed/Reviewed Doctor Visits Reviewed, Specialist  Durable Medical Equipment (DME) Other  [wound care supplies]  PCP/Specialist Visits Compliance with follow-up visit  Education Interventions   Education Provided Provided Education  Provided Verbal Education On [employer], [employer], Other  [pending assist from [facility] for in network home health services for wound care, rn cm services, follow up plan with urology +]                SDOH assessments and interventions completed:  No     Care Coordination Interventions:  Yes, provided   Follow up plan: Follow up call scheduled for 11/05/23    Encounter Outcome:  Patient Visit Completed   Cala Bradford L. Noelle Penner, RN, BSN, CCM [place]  [facility] Care Institute, [facility] Health RN Care Manager Direct Dial: [phone]  Fax: [phone] Mailing Address: [address]  [place] [address] Website: [email or web address]

## 2023-11-05 ENCOUNTER — Emergency Department (HOSPITAL_COMMUNITY): Payer: 59

## 2023-11-05 ENCOUNTER — Other Ambulatory Visit: Payer: Self-pay

## 2023-11-05 ENCOUNTER — Ambulatory Visit: Payer: Self-pay | Admitting: *Deleted

## 2023-11-05 ENCOUNTER — Emergency Department (HOSPITAL_COMMUNITY)
Admission: EM | Admit: 2023-11-05 | Discharge: 2023-11-06 | Disposition: A | Discharge status: Home / Self Care | Payer: 59 | Attending: Emergency Medicine | Admitting: Emergency Medicine

## 2023-11-05 DIAGNOSIS — Z7982 Long term (current) use of aspirin: Secondary | ICD-10-CM | POA: Insufficient documentation

## 2023-11-05 DIAGNOSIS — M545 Low back pain, unspecified: Secondary | ICD-10-CM | POA: Diagnosis present

## 2023-11-05 LAB — CBC WITH DIFFERENTIAL/PLATELET
Abs Immature Granulocytes: 0.03 10*3/uL (ref 0.00–0.07)
Basophils Absolute: 0 10*3/uL (ref 0.0–0.1)
Basophils Relative: 0 %
Eosinophils Absolute: 0.1 10*3/uL (ref 0.0–0.5)
Eosinophils Relative: 1 %
HCT: 36.1 % — ABNORMAL LOW (ref 39.0–52.0)
Hemoglobin: 10.9 g/dL — ABNORMAL LOW (ref 13.0–17.0)
Immature Granulocytes: 0 %
Lymphocytes Relative: 13 %
Lymphs Abs: 1.2 10*3/uL (ref 0.7–4.0)
MCH: 26.6 pg (ref 26.0–34.0)
MCHC: 30.2 g/dL (ref 30.0–36.0)
MCV: 88 fL (ref 80.0–100.0)
Monocytes Absolute: 0.6 10*3/uL (ref 0.1–1.0)
Monocytes Relative: 6 %
Neutro Abs: 7.3 10*3/uL (ref 1.7–7.7)
Neutrophils Relative %: 80 %
Platelets: 230 10*3/uL (ref 150–400)
RBC: 4.1 MIL/uL — ABNORMAL LOW (ref 4.22–5.81)
RDW: 16.2 % — ABNORMAL HIGH (ref 11.5–15.5)
WBC: 9.2 10*3/uL (ref 4.0–10.5)
nRBC: 0 % (ref 0.0–0.2)

## 2023-11-05 LAB — BASIC METABOLIC PANEL
Anion gap: 11 (ref 5–15)
BUN: 15 mg/dL (ref 6–20)
CO2: 28 mmol/L (ref 22–32)
Calcium: 9 mg/dL (ref 8.9–10.3)
Chloride: 100 mmol/L (ref 98–111)
Creatinine, Ser: 0.9 mg/dL (ref 0.61–1.24)
GFR, Estimated: >60 mL/min (ref >60)
Glucose, Bld: 93 mg/dL (ref 70–99)
Potassium: 4.8 mmol/L (ref 3.5–5.1)
Sodium: 139 mmol/L (ref 135–145)

## 2023-11-05 MED ORDER — HYDROMORPHONE HCL 1 MG/ML IJ SOLN: 1.0000 mg | Freq: Once | INTRAMUSCULAR | Status: DC

## 2023-11-05 MED ORDER — HYDROMORPHONE HCL 1 MG/ML IJ SOLN
1.0000 mg | Freq: Once | INTRAMUSCULAR | Status: DC
  Administered 2023-11-05: 1 mg via INTRAVENOUS
  Filled 2023-11-05: qty 1

## 2023-11-05 MED ORDER — HYDROMORPHONE HCL 1 MG/ML IJ SOLN
1.0000 mg | Freq: Once | INTRAMUSCULAR | Status: AC
  Administered 2023-11-05: 1 mg via INTRAVENOUS
  Filled 2023-11-05: qty 1

## 2023-11-05 MED ORDER — METHADONE HCL 10 MG PO TABS
5.0000 mg | ORAL_TABLET | Freq: Once | ORAL | Status: AC
  Administered 2023-11-05: 5 mg via ORAL
  Filled 2023-11-05: qty 1

## 2023-11-05 NOTE — ED Provider Notes (Signed)
[place] EMERGENCY DEPARTMENT AT [facility] Provider Note   CSN: [id number] Arrival date & time: 11/05/23  1529     History  Chief Complaint  Patient presents with   Back Pain    [name] is a 58 y.o. male.  58 year old male with prior medical history as detailed below presents for evaluation.  Patient is a paraplegic.  He reports that while he was driving his Zenaida Niece he attempted to transfer.  During the transfer he strained his low back.  He denies other injury.  He reports that he was in his normal state of health until the incident occurred while he was doing his transfer.    The history is provided by the patient and medical records.       Home Medications Prior to Admission medications   Medication Sig Start Date End Date Taking? Authorizing Provider  acetaminophen (TYLENOL) 500 MG tablet Take 2 tablets (1,000 mg total) by mouth 3 (three) times daily as needed for mild pain or moderate pain (or Fever >/= 101). 02/09/22   Marolyn Haller, MD  allopurinol (ZYLOPRIM) 100 MG tablet Take 1 tablet (100 mg total) by mouth daily. 01/09/22 01/27/23  Evlyn Kanner, MD  amLODipine (NORVASC) 10 MG tablet Take 1 tablet (10 mg total) by mouth daily. 01/31/23 03/02/23  Willette Cluster, MD  ascorbic acid (VITAMIN C) 500 MG tablet Take 1 tablet (500 mg total) by mouth 2 (two) times daily. 02/09/22   Marolyn Haller, MD  aspirin EC 81 MG tablet Take 81 mg by mouth every morning. Swallow whole.    [provider]  benzocaine (ANBESOL) 10 % mucosal gel Use as directed 1 application. in the mouth or throat daily as needed for mouth pain.    [provider]  Calcium Carbonate-Vitamin D (CALCIUM-D PO) Take 1 tablet by mouth every morning.    [provider]  Chlorhexidine Gluconate Cloth 2 % PADS Apply 6 each topically daily. Patient not taking: Reported on 01/27/2023 02/09/22   Marolyn Haller, MD  clotrimazole (LOTRIMIN) 1 % cream Apply topically 2 (two)  times daily. Patient taking differently: Apply 1 application  topically 2 (two) times daily. 01/09/22   Elige Radon, MD  diclofenac Sodium (VOLTAREN) 1 % GEL Apply 2 g topically 3 (three) times daily.    [provider]  famotidine (PEPCID) 40 MG tablet Take 1 tablet (40 mg total) by mouth daily. 10/05/22   Grayce Sessions, NP  feeding supplement (ENSURE ENLIVE / ENSURE PLUS) LIQD Take 237 mLs by mouth 2 (two) times daily between meals. 02/09/22   Marolyn Haller, MD  ferrous sulfate 325 (65 FE) MG tablet Take 1 tablet (325 mg total) by mouth daily with breakfast. 02/09/22   Marolyn Haller, MD  ibuprofen (ADVIL) 400 MG tablet Take 1 tablet (400 mg total) by mouth every 8 (eight) hours as needed. Patient taking differently: Take 400 mg by mouth every 8 (eight) hours as needed for mild pain. 02/09/22   Marolyn Haller, MD  liver oil-zinc oxide (DESITIN) 40 % ointment Apply 1 application topically daily.    [provider]  lubiprostone (AMITIZA) 24 MCG capsule TAKE 1 CAPSULE (24 MCG TOTAL) BY MOUTH 2 (TWO) TIMES DAILY WITH A MEAL. 02/13/22   Zehr, Princella Pellegrini, PA-C  magnesium hydroxide (MILK OF MAGNESIA) 400 MG/5ML suspension Take 30 mLs by mouth daily. 02/09/22   Marolyn Haller, MD  methadone (DOLOPHINE) 1 MG/1ML solution Take 30 mg by mouth daily.    [provider]  Multiple Vitamin (MULTIVITAMIN WITH MINERALS) TABS tablet Take 1 tablet by mouth daily. 02/09/22   Marolyn Haller, MD  nutrition supplement, JUVEN, (JUVEN) PACK Take 1 packet by mouth 2 (two) times daily between meals. 02/09/22   Marolyn Haller, MD  Omega-3 1000 MG CAPS Take 1 capsule by mouth daily.    [provider]  oxybutynin (DITROPAN) 5 MG tablet Take 5 mg by mouth 3 (three) times daily. 07/04/22   [provider]  Oxymetazoline HCl (NASAL SPRAY NA) Place 1 spray into both nostrils daily as needed (congestion). Family care nasal spray    [provider]   polyethylene glycol (MIRALAX / GLYCOLAX) 17 g packet Take 17 g by mouth 2 (two) times daily. 02/09/22   Marolyn Haller, MD  predniSONE (DELTASONE) 20 MG tablet Take 40 mg by mouth daily as needed (For a bad gout flair up). 11/08/21   [provider]  saccharomyces boulardii (FLORASTOR) 250 MG capsule Take 250 mg by mouth daily as needed (For IBS).    [provider]  senna-docusate (SENOKOT-S) 8.6-50 MG tablet Take 2 tablets by mouth 2 (two) times daily. 02/06/23   Masters, Katie, DO      Allergies    Mirabegron, Oxybutynin, Pork-derived products, and Other    Review of Systems   Review of Systems  All other systems reviewed and are negative.   Physical Exam Updated Vital Signs BP (!) 147/67 (BP Location: Left Wrist)   Pulse 80   Temp 98.7 F (37.1 C) (Oral)   Resp 18   SpO2 96%  Physical Exam Vitals and nursing note reviewed.  Constitutional:      General: He is not in acute distress.    Appearance: Normal appearance. He is well-developed.  HENT:     Head: Normocephalic and atraumatic.  Eyes:     Conjunctiva/sclera: Conjunctivae normal.     Pupils: Pupils are equal, round, and reactive to light.  Cardiovascular:     Rate and Rhythm: Normal rate and regular rhythm.     Heart sounds: Normal heart sounds.  Pulmonary:     Effort: Pulmonary effort is normal. No respiratory distress.     Breath sounds: Normal breath sounds.  Abdominal:     General: There is no distension.     Palpations: Abdomen is soft.     Tenderness: There is no abdominal tenderness.  Musculoskeletal:        General: No deformity. Normal range of motion.     Cervical back: Normal range of motion and neck supple.  Skin:    General: Skin is warm and dry.  Neurological:     Mental Status: He is alert and oriented to person, place, and time.     Comments: Alert, oriented x 4, chronic paraplegic with an L2 level.  Suprapubic catheter in place.  Colostomy bag in place.  Patient localizes  his back pain diffusely to the lumbar back.  No discrete step-off or abrasion or break in skin noted.     ED Results / Procedures / Treatments   Labs (all labs ordered are listed, but only abnormal results are displayed) Labs Reviewed  BASIC METABOLIC PANEL  CBC WITH DIFFERENTIAL/PLATELET    EKG None  Radiology No results found.  Procedures Procedures    Medications Ordered in ED Medications  HYDROmorphone (DILAUDID) injection 1 mg (has no administration in time range)    ED Course/ Medical Decision Making/ A&P  Medical Decision Making Amount and/or Complexity of Data Reviewed Labs: ordered. Radiology: ordered.  Risk Prescription drug management.    Medical Screen Complete  This patient presented to the ED with complaint of low back pain.  This complaint involves an extensive number of treatment options. The initial differential diagnosis includes, but is not limited to, muscular strain, fracture, etc.  This presentation is: Acute, Self-Limited, Previously Undiagnosed, Uncertain Prognosis, Complicated, Systemic Symptoms, and Threat to Life/Bodily Function  Patient with L2 paraplegia presents with low back pain after apparent straining incident while he was attempting to self transfer.  Imaging obtained is without evidence of acute abnormality.  Other screening labs obtained are without significant abnormality  Patient feels much improved after administration of pain medicine.  He now desires discharge home.  Strict return precautions given and understood.  Additional history obtained:  External records from outside sources obtained and reviewed including prior ED visits and prior Inpatient records.    Lab Tests:  I ordered and personally interpreted labs.  The pertinent results include: CBC, BMP   Imaging Studies ordered:  I ordered imaging studies including CT lumbar spine, plain films of pelvis I independently  visualized and interpreted obtained imaging which showed NAD I agree with the radiologist interpretation.   Medicines ordered:  I ordered medication including Dilaudid, methadone for pain Reevaluation of the patient after these medicines showed that the patient: improved   Problem List / ED Course:  Low back pain   Reevaluation:  After the interventions noted above, I reevaluated the patient and found that they have: improved   Disposition:  After consideration of the diagnostic results and the patients response to treatment, I feel that the patent would benefit from close outpatient follow-up.          Final Clinical Impression(s) / ED Diagnoses Final diagnoses:  Acute midline low back pain without sciatica    Rx / DC Orders ED Discharge Orders     None         Wynetta Fines, MD 11/06/23 0001

## 2023-11-05 NOTE — Patient Outreach (Signed)
Care Coordination   Follow up note/warm transfer to new RN CM/navigator+  Visit Note   11/06/2023 Name: [name] MRN: [id number] DOB: 11-17-1965  [name] is a 58 y.o. year old male who sees [name], [place] for primary care. I spoke with  [name] by phone today.  What matters to the patients health and wellness today?  Upcoming transportation/accomodation for dental appointment, Legal aid, Follow up with Dr [name] for further wound & urology services, Warm transfer to [place] [facility] dual complete Navigator & nurse case manager  Dental- patient reports he has resolved concerns with transportation & accomodation at upcoming dental office after leaving RN Cm a message on 11/02/23.   Legal aid never return a call after patient attempts to reach them  Home health services ordered by [facility] urology but [facility] not able to assist and urology office has not attempted other practices as patient has not received home health outreaches  Follow up with Dr [name], Urology - recent surgical site still not completely close per patient but is better. Patient has not received any follow up appointments to see Dr [name], Urologist   [employer] Dual complete services/warm transfer Patient voiced understanding that the new Ucard he received in December 2024 with an increase for U Card services also is his united healthcare insurance ID card as his ID is listed. He and RN CM confirmed the [facility] ID number is the same as the last scanned EPIC 2020 card. He voiced understanding of providing annually his new insurance card to all medical providers. He voiced understanding that it appears since 2020 His medical providers have been providing services after using his old card ID number to gather information on he new coverages.    [place] transfer to united healthcare Navigator & nurse case manager [name] voiced understanding that this [facility] RN CM will not be able to continue to  provide services will assist him with numbers & transfer to his assigned insurance [name]. A letter will be sent with the contact information for patient's records & to attempt to assist with him misplacing this information  [facility] navigator is [name] [phone] ext [phone] [facility] nurse care manager is [name] [phone] -[employer] at home  Outreach to [facility] DSNP Navigator team is [phone] united The navigator will assist with other special aids and services for people with disabilities  Help in making or getting to appointments  Care managers who can help you get the care you need  Names and addresses of providers who specialize in your condition  If you use a wheelchair, we can tell you if a doctor's office is wheelchair accessible and help  you make or get to appointments  Easy access to any services (like ADA accessible, ramps, handrails and other services) Another number to use to ask for services, is Member Services at [phone], TTY [address] with [name] at [phone] about scheduling patient for a follow  Monday December 10 2023 10 am follow up  Sent message to nursing staff    Goals Addressed               This Visit's Progress     Patient Stated     Patient will obtain a stretcher transport visit to wound care center + out of county [facility] transportation as needed, Assistance with Zenaida Niece conversiondecrease hospital visits [facility]) (pt-stated)   On track     New  goal- Patient will have healing of his fistula opening- 11/05/23 Healing- reports one small area not healed at fistula site and his buttocks site is almost completely gone   Patient will continue to make attempts to get needed transportation to attend his wound care, pain management and urology visits as scheduled with available transportation- Updated He has not missed an appointment since ones that had to be rescheduled while inpatient for urology  surgery (not in his control) - those were rescheduled-  Patient will continue to have home aide complete sacral and bladder wound care- Noted wound healing during recent clinic visit- 09/14/23  by wound nurses but not by covering provider  Patient will receive a mattress for his hospital bed - Pending Patient will get assistance to repair/convert his Zenaida Niece to a usable transport option. 09/18/23 Pending    Interventions Today    Flowsheet Row Most Recent Value  Chronic Disease   Chronic disease during today's visit Other  [warm transfer to [facility] RN CM/natigator services, follow up wiht Dr [name] office visit, Follow up on home health services, follow up on dental services -trasnportation]  General Interventions   General Interventions Discussed/Reviewed General Interventions Reviewed, [employer], Doctor Visits, Communication with  Doctor Visits Discussed/Reviewed Doctor Visits Reviewed, PCP, Specialist  PCP/Specialist Visits Compliance with follow-up visit  Contacted provider for referral to Specialist  [outreadched to Dr [name] office about a pt follow up office visit, & update status on home health services]  Communication with PCP/Specialists, RN  Education Interventions   Education Provided Provided Education  Provided Arboriculturist, Other  [employer] RN CM & Navigator program vs VBCI RN CM services]  Mental Health Interventions   Mental Health Discussed/Reviewed Mental Health Reviewed, Coping Strategies                SDOH assessments and interventions completed:  No     Care Coordination Interventions:  Yes, provided   Follow up plan: No further intervention required.   Encounter Outcome:  Patient Visit Completed   Cala Bradford L. Noelle Penner, RN, BSN, CCM [place]  [facility], [facility] RN Care Manager Direct Dial: [phone]  Fax: [phone] Mailing Address: [address]  [place] [address] Website:  [email or web address]

## 2023-11-05 NOTE — ED Notes (Signed)
Attempted IV, failed attempt

## 2023-11-05 NOTE — ED Triage Notes (Signed)
Patient from home via EMS-patient trying to transfer out of his vehicle into his motorized wheelchair approximately one hour ago. His leg got stuck in this process and thinks he pulled a muscle in his back-now has central lower back pain.

## 2023-11-05 NOTE — Discharge Instructions (Signed)
 Return for any problem.  ?

## 2023-11-06 NOTE — Patient Instructions (Signed)
Visit Information  Thank you for taking time to visit with me today. Please don't hesitate to contact me if I can be of assistance to you.   Following are the goals we discussed today:   Goals Addressed               This Visit's Progress     Patient Stated     Patient will obtain a stretcher transport visit to wound care center + out of county [facility] transportation as needed, Assistance with [employer] hospital visits [facility]) (pt-stated)   On track     New goal- Patient will have healing of his fistula opening- 11/05/23 Healing- reports one small area not healed at fistula site and his buttocks site is almost completely gone   Patient will continue to make attempts to get needed transportation to attend his wound care, pain management and urology visits as scheduled with available transportation- Updated He has not missed an appointment since ones that had to be rescheduled while inpatient for urology surgery (not in his control) - those were rescheduled-  Patient will continue to have home aide complete sacral and bladder wound care- Noted wound healing during recent clinic visit- 09/14/23  by wound nurses but not by covering provider  Patient will receive a mattress for his hospital bed - Pending Patient will get assistance to repair/convert his Zenaida Niece to a usable transport option. 09/18/23 Pending    Interventions Today    Flowsheet Row Most Recent Value  Chronic Disease   Chronic disease during today's visit Other  [warm transfer to [facility] RN CM/natigator services, follow up wiht Dr [name] office visit, Follow up on home health services, follow up on dental services -trasnportation]  General Interventions   General Interventions Discussed/Reviewed General Interventions Reviewed, [employer], Doctor Visits, Communication with  Doctor Visits Discussed/Reviewed Doctor Visits Reviewed, PCP, Specialist  PCP/Specialist Visits Compliance with follow-up visit   Contacted provider for referral to Specialist  [outreadched to Dr [name] office about a pt follow up office visit, & update status on home health services]  Communication with PCP/Specialists, RN  Education Interventions   Education Provided Provided Education  Provided Arboriculturist, Other  [facility] RN CM & Navigator program vs VBCI RN CM services]  Mental Health Interventions   Mental Health Discussed/Reviewed Mental Health Reviewed, Coping Strategies                Our next appointment is by telephone on 11/08/23 at 3:30 pm  Please call the care guide team at [phone] if you need to cancel or reschedule your appointment.   If you are experiencing a Mental Health or Behavioral Health Crisis or need someone to talk to, please call the Suicide and Crisis Lifeline: 988 call the [place] National Suicide Prevention Lifeline: 910-786-2316 or TTY: (201) 651-5941 TTY 918-466-9003) to talk to a trained counselor call 1-800-273-TALK (toll free, 24 hour hotline) go to [facility] [address] [place] [phone]) call 911   Patient verbalizes understanding of instructions and care plan provided today and agrees to view in MyChart. Active MyChart status and patient understanding of how to access instructions and care plan via MyChart confirmed with patient.     The patient has been provided with contact information for the care management team and has been advised to call with any health related questions or concerns.    Shaianne Nucci L. Noelle Penner, RN, BSN, CCM [place]  Value Based [employer], [employer]  RN Care Manager Direct Dial: [phone]  Fax: [phone] Mailing Address: [address]  [place] [address] Website: [email or web address]

## 2023-11-08 ENCOUNTER — Encounter: Payer: Self-pay | Admitting: *Deleted

## 2023-11-21 ENCOUNTER — Telehealth (INDEPENDENT_AMBULATORY_CARE_PROVIDER_SITE_OTHER): Payer: Self-pay

## 2023-11-21 NOTE — Telephone Encounter (Signed)
Returned a call to [employer] and spoke to [place] and made her aware that pt has a new pcp and he is seeing urology. Per [employer] they will notate his account and send appropriate paperwork to new provider

## 2023-11-21 NOTE — Telephone Encounter (Signed)
Copied from CRM [id number]. Topic: General - Other >> Nov 21, 2023 11:31 AM Epimenio Foot F wrote: Reason for CRM: Yamilee from [employer] is calling in because they faxed over an [employer] form for Marcelino Duster to sign, and wants to know if the paperwork was received. Noni Saupe says she refaxed the form and if it there are any questions or if the form isn't received, she can be contacted at [phone].

## 2023-12-06 ENCOUNTER — Ambulatory Visit: Payer: Self-pay | Admitting: *Deleted

## 2023-12-06 NOTE — Patient Instructions (Signed)
 Visit Information  Thank you for taking time to visit with me today. Please don't hesitate to contact me if I can be of assistance to you.   Following are the goals we discussed today:   Goals Addressed   None     Our next appointment is by telephone on na at na/pending  Please call the care guide team at [phone] if you need to cancel or reschedule your appointment.   If you are experiencing a Mental Health or Behavioral Health Crisis or need someone to talk to, please call the Suicide and Crisis Lifeline: 988 call the [place] National Suicide Prevention Lifeline: 618-011-8106 or TTY: (670)815-7630 TTY 6711401232) to talk to a trained counselor call 1-800-273-TALK (toll free, 24 hour hotline) go to [facility] [address], [place] [phone]) call 911   Patient verbalizes understanding of instructions and care plan provided today and agrees to view in MyChart. Active MyChart status and patient understanding of how to access instructions and care plan via MyChart confirmed with patient.     The patient has been provided with contact information for the care management team and has been advised to call with any health related questions or concerns.   Charvez Voorhies L. Noelle Penner, RN, BSN, CCM [place]  Value Based Care Institute, [facility] RN Care Manager Direct Dial: [phone]  Fax: [phone]

## 2023-12-24 ENCOUNTER — Ambulatory Visit: Payer: Self-pay | Admitting: *Deleted

## 2023-12-24 NOTE — Patient Instructions (Signed)
 Visit Information  Thank you for taking time to visit with me today. Please don't hesitate to contact me if I can be of assistance to you.   Following are the goals we discussed today:   Goals Addressed               This Visit's Progress     Patient Stated     Patient will receive home health services for suprapubic catheter changes with assist from [facility] urology triage RN(VBCI RN CM) (pt-stated)   On track     12/24/23 Daun Peacock LP [facility] urology states [facility] to assist with suprapubic catheter changes Pending start date  Patient will have healing of his fistula opening- 12/24/23 Healed-goal met   Patient will continue to make attempts to get needed transportation to attend his wound care, pain management and urology visits as scheduled with available transportation- Updated He has not missed an appointment since ones that had to be rescheduled while inpatient for urology surgery (not in his control) - those were rescheduled-Goal met 11/05/23  Patient will continue to have home aide complete sacral and bladder wound care- Noted wound healing during recent clinic visit- 12/24/23 wounds healed goal met  Patient will receive a mattress for his hospital bed - Pending Patient will get assistance to repair/convert his Zenaida Niece to a usable transport option. 12/24/23 Pending patient follow up    Interventions Today    Flowsheet Row Most Recent Value  Chronic Disease   Chronic disease during today's visit Other  [home health services with assist from [facility] urology triage nurse, Clydie Braun (Dr [name]) for suprapubic catheter changes]  General Interventions   General Interventions Discussed/Reviewed General Interventions Reviewed, Durable Medical Equipment (DME), Community Resources, Communication with  [suprapubic catheter changes needing home health services]  Durable Medical Equipment (DME) Other  [suprapubic catheter changes]  Communication with PCP/Specialists, RN  Jill Side call to [facility] Lange urology  triage nurse another email sent to jolia gonzales & shunkeena barldlavens + email to [email or web address] to attempt to warm transfer patient to [facility] care manager/navigator Kisha at [phone] (transportation/home health)]                Our next appointment is  na  on na at na  Please call the care guide team at [phone] if you need to cancel or reschedule your appointment.   If you are experiencing a Mental Health or Behavioral Health Crisis or need someone to talk to, please call the Suicide and Crisis Lifeline: 988 call the [place] National Suicide Prevention Lifeline: (325) 137-0189 or TTY: 630-326-0954 TTY 3516168570) to talk to a trained counselor call 1-800-273-TALK (toll free, 24 hour hotline) go to [facility] [address], [place] [phone]) call 911   Patient verbalizes understanding of instructions and care plan provided today and agrees to view in MyChart. Active MyChart status and patient understanding of how to access instructions and care plan via MyChart confirmed with patient.     The patient has been provided with contact information for the care management team and has been advised to call with any health related questions or concerns.   Arlan Birks L. Noelle Penner, RN, BSN, CCM [place]  [facility], [facility] RN Care Manager Direct Dial: [phone]  Fax: [phone]

## 2023-12-24 NOTE — Patient Outreach (Signed)
 Care Coordination   Collaboration with [facility] urology  Visit Note   12/24/2023 Name: [name] MRN: [id number] DOB: 1966-04-12  [name] is a 58 y.o. year old male who sees [name], [place] for primary care. I  received a voice message from Clydie Braun, triage [facility] urology nurse [phone]. I returned a call to her & was able to speak with Clydie Braun on the second attempt  What matters to the patients health and wellness today?  Follow up with [facility] urology- home health services for supra pubic cath changes.  Patient had discussed with Clydie Braun the difficulties this RN CM & a hospital RN CM in 2023 had with getting home health services for him.  Clydie Braun and RN CM reviewed the difficulties with finding accepting home health agencies for patient since 2023.  Clydie Braun and RN CM are hopeful to find an agency.  Hope that the patient reported change in united healthcare coverage will help. RN CM still unable to find a new 2025 scanned insurance in [place], only the 2022 card   Clydie Braun report [facility] in [place] is willing to assist patient with home suprapubic catheter changes & Dr [name] will provide orders.   RN CM voiced appreciation for Clydie Braun assistance to patient   Goals Addressed               This Visit's Progress     Patient Stated     Patient will receive home health services for suprapubic catheter changes with assist from [facility] urology triage RN(VBCI RN CM) (pt-stated)   On track     12/24/23 Daun Peacock LP [facility] urology states [facility] to assist with suprapubic catheter changes Pending start date  Patient will have healing of his fistula opening- 12/24/23 Healed-goal met   Patient will continue to make attempts to get needed transportation to attend his wound care, pain management and urology visits as scheduled with available transportation- Updated He has not missed an appointment since ones that had to be rescheduled while inpatient for urology surgery (not in his control) - those were  rescheduled-Goal met 11/05/23  Patient will continue to have home aide complete sacral and bladder wound care- Noted wound healing during recent clinic visit- 12/24/23 wounds healed goal met  Patient will receive a mattress for his hospital bed - Pending Patient will get assistance to repair/convert his Zenaida Niece to a usable transport option. 12/24/23 Pending patient follow up    Interventions Today    Flowsheet Row Most Recent Value  Chronic Disease   Chronic disease during today's visit Other  [home health services with assist from [facility] urology triage nurse, Clydie Braun (Dr [name]) for suprapubic catheter changes]  General Interventions   General Interventions Discussed/Reviewed General Interventions Reviewed, Durable Medical Equipment (DME), Community Resources, Communication with  [suprapubic catheter changes needing home health services]  Durable Medical Equipment (DME) Other  [suprapubic catheter changes]  Communication with PCP/Specialists, RN  Jill Side call to [facility] urology triage nurse another email sent to jolia gonzales & shunkeena barldlavens + email to [email or web address] to attempt to warm transfer patient to [facility] care manager/navigator Kisha at [phone] (transportation/home health)]                SDOH assessments and interventions completed:  No     Care Coordination Interventions:  Yes, provided   Follow up plan: No further intervention required.   Encounter Outcome:  Patient Visit Completed   Cala Bradford L. Noelle Penner, RN, BSN, CCM [place]  [facility], [facility] RN Care Manager Direct Dial: [phone]  Fax: [phone]

## 2024-02-20 ENCOUNTER — Other Ambulatory Visit: Payer: Self-pay

## 2024-02-20 ENCOUNTER — Emergency Department (HOSPITAL_COMMUNITY)
Admission: EM | Admit: 2024-02-20 | Discharge: 2024-02-21 | Disposition: A | Discharge status: Home / Self Care | Attending: Emergency Medicine | Admitting: Emergency Medicine

## 2024-02-20 ENCOUNTER — Encounter (HOSPITAL_COMMUNITY): Payer: Self-pay

## 2024-02-20 DIAGNOSIS — Y828 Other medical devices associated with adverse incidents: Secondary | ICD-10-CM | POA: Insufficient documentation

## 2024-02-20 DIAGNOSIS — T83091A Other mechanical complication of indwelling urethral catheter, initial encounter: Secondary | ICD-10-CM | POA: Diagnosis present

## 2024-02-20 DIAGNOSIS — Z7982 Long term (current) use of aspirin: Secondary | ICD-10-CM | POA: Insufficient documentation

## 2024-02-20 NOTE — ED Notes (Signed)
 [facility] called to transport patient back home, patient awaiting transport, states no needs at this time.

## 2024-02-20 NOTE — ED Notes (Signed)
 ED Provider at bedside.

## 2024-02-20 NOTE — ED Notes (Signed)
 MD and this RN at bedside, MD deflated Balloon in suprapubic catheter and reinserted, urine began flowing into drainage bag. MD re-inflated balloon with 10 mL NS. Patient resting in bed, states no other needs at this time.

## 2024-02-20 NOTE — ED Triage Notes (Addendum)
 Pt BIB [facility] from home. Pt has a suprapubic catheter and his home aide flushed his catheter and reports that fluid came out of his penis. The foley bag is still collecting, pt reports still feeling full in his bladder. EMS reports seeing blood in the urine. Pt is bedbound at baseline with a right leg contracture.   EMS VSS

## 2024-02-20 NOTE — Discharge Instructions (Signed)
 Your catheter is images if it is properly placed now.  Follow-up with your urologist as needed.

## 2024-02-20 NOTE — ED Provider Notes (Signed)
 [place] EMERGENCY DEPARTMENT AT [facility] Provider Note   CSN: [id number] Arrival date & time: 02/20/24  2054     History  No chief complaint on file.   [name] is a 58 y.o. male.  HPI Patient presents with suprapubic catheter complication.  Reported home health aide flushed his catheter and reportedly had some fluid come out of his penis.  May have had some blood in the bag.    Home Medications Prior to Admission medications   Medication Sig Start Date End Date Taking? Authorizing Provider  acetaminophen  (TYLENOL ) 500 MG tablet Take 2 tablets (1,000 mg total) by mouth 3 (three) times daily as needed for mild pain or moderate pain (or Fever >/= 101). 02/09/22   Joette Mustard, MD  allopurinol  (ZYLOPRIM ) 100 MG tablet Take 1 tablet (100 mg total) by mouth daily. 01/09/22 01/27/23  Toy Freund, MD  amLODipine  (NORVASC ) 10 MG tablet Take 1 tablet (10 mg total) by mouth daily. 01/31/23 03/02/23  Allana Arabia, MD  ascorbic acid  (VITAMIN C ) 500 MG tablet Take 1 tablet (500 mg total) by mouth 2 (two) times daily. 02/09/22   Joette Mustard, MD  aspirin  EC 81 MG tablet Take 81 mg by mouth every morning. Swallow whole.    [provider]  benzocaine  (ANBESOL) 10 % mucosal gel Use as directed 1 application. in the mouth or throat daily as needed for mouth pain.    [provider]  Calcium  Carbonate-Vitamin D  (CALCIUM -D PO) Take 1 tablet by mouth every morning.    [provider]  Chlorhexidine  Gluconate Cloth 2 % PADS Apply 6 each topically daily. Patient not taking: Reported on 01/27/2023 02/09/22   Joette Mustard, MD  clotrimazole  (LOTRIMIN ) 1 % cream Apply topically 2 (two) times daily. Patient taking differently: Apply 1 application  topically 2 (two) times daily. 01/09/22   Melodye Spurr, MD  diclofenac  Sodium (VOLTAREN ) 1 % GEL Apply 2 g topically 3 (three) times daily.    [provider]  famotidine  (PEPCID ) 40 MG  tablet Take 1 tablet (40 mg total) by mouth daily. 10/05/22   Marius Siemens, NP  feeding supplement (ENSURE ENLIVE / ENSURE PLUS) LIQD Take 237 mLs by mouth 2 (two) times daily between meals. 02/09/22   Joette Mustard, MD  ferrous sulfate  325 (65 FE) MG tablet Take 1 tablet (325 mg total) by mouth daily with breakfast. 02/09/22   Joette Mustard, MD  ibuprofen  (ADVIL ) 400 MG tablet Take 1 tablet (400 mg total) by mouth every 8 (eight) hours as needed. Patient taking differently: Take 400 mg by mouth every 8 (eight) hours as needed for mild pain. 02/09/22   Joette Mustard, MD  liver oil-zinc  oxide (DESITIN) 40 % ointment Apply 1 application topically daily.    [provider]  lubiprostone  (AMITIZA ) 24 MCG capsule TAKE 1 CAPSULE (24 MCG TOTAL) BY MOUTH 2 (TWO) TIMES DAILY WITH A MEAL. 02/13/22   Zehr, Jessica D, PA-C  magnesium  hydroxide (MILK OF MAGNESIA) 400 MG/5ML suspension Take 30 mLs by mouth daily. 02/09/22   Joette Mustard, MD  methadone  (DOLOPHINE ) 1 MG/1ML solution Take 30 mg by mouth daily.    [provider]  Multiple Vitamin (MULTIVITAMIN WITH MINERALS) TABS tablet Take 1 tablet by mouth daily. 02/09/22   Joette Mustard, MD  nutrition supplement, JUVEN, (JUVEN) PACK Take 1 packet by mouth 2 (two) times daily between meals. 02/09/22   Joette Mustard, MD  Omega-3 1000 MG CAPS Take 1 capsule by mouth  daily.    [provider]  oxybutynin (DITROPAN) 5 MG tablet Take 5 mg by mouth 3 (three) times daily. 07/04/22   [provider]  Oxymetazoline  HCl (NASAL SPRAY NA) Place 1 spray into both nostrils daily as needed (congestion). Family care nasal spray    [provider]  polyethylene glycol (MIRALAX  / GLYCOLAX ) 17 g packet Take 17 g by mouth 2 (two) times daily. 02/09/22   Joette Mustard, MD  predniSONE  (DELTASONE ) 20 MG tablet Take 40 mg by mouth daily as needed (For a bad gout flair up). 11/08/21   [provider]   saccharomyces boulardii (FLORASTOR) 250 MG capsule Take 250 mg by mouth daily as needed (For IBS).    [provider]  senna-docusate (SENOKOT-S) 8.6-50 MG tablet Take 2 tablets by mouth 2 (two) times daily. 02/06/23   Masters, Alston Jerry, DO      Allergies    Mirabegron, Oxybutynin, Pork-derived products, and Other    Review of Systems   Review of Systems  Physical Exam Updated Vital Signs BP (!) 163/80 (BP Location: Right Wrist)   Pulse 87   Temp 98.3 F (36.8 C) (Oral)   Resp 17   Ht 6\' 1"  (1.854 m)   Wt (!) 167.8 kg   SpO2 100%   BMI 48.82 kg/m  Physical Exam Vitals and nursing note reviewed.  Cardiovascular:     Rate and Rhythm: Regular rhythm.  Abdominal:     Tenderness: There is abdominal tenderness.     Comments: Suprapubic catheter in place.  Some bloody urine in the bag.  Neurological:     Mental Status: He is alert.     ED Results / Procedures / Treatments   Labs (all labs ordered are listed, but only abnormal results are displayed) Labs Reviewed - No data to display  EKG None  Radiology No results found.  Procedures Procedures    Medications Ordered in ED Medications - No data to display  ED Course/ Medical Decision Making/ A&P                                 Medical Decision Making  Patient with difficulty draining from his suprapubic catheter.  Replaced today.  Initially balloons been taken down and advanced.  Pulled back and had some more draining.  However did have decreased urination again.  Balloon pump blown and brought back out so the tip was just at the ostomy.  Put back in and had good drainage of urine after pulling down the balloon initially.  Tip balloon could have been in the urethra.  Will discharge home for urology follow-up        Final Clinical Impression(s) / ED Diagnoses Final diagnoses:  None    Rx / DC Orders ED Discharge Orders     None         Mozell Arias, MD 02/20/24 2310

## 2024-02-20 NOTE — ED Notes (Signed)
 Patient discharged in stable condition, education materials explained including, follow up, any prescriptions and reasons to return. Patient voiced agreement to education and discharge material.

## 2024-03-31 ENCOUNTER — Emergency Department (HOSPITAL_COMMUNITY)

## 2024-03-31 ENCOUNTER — Inpatient Hospital Stay (HOSPITAL_COMMUNITY)
Admission: EM | Admit: 2024-03-31 | Discharge: 2024-04-03 | DRG: 388 | Disposition: A | Discharge status: Home / Self Care | Attending: Student | Admitting: Student

## 2024-03-31 DIAGNOSIS — G894 Chronic pain syndrome: Secondary | ICD-10-CM | POA: Diagnosis present

## 2024-03-31 DIAGNOSIS — Z79899 Other long term (current) drug therapy: Secondary | ICD-10-CM | POA: Diagnosis not present

## 2024-03-31 DIAGNOSIS — Z7952 Long term (current) use of systemic steroids: Secondary | ICD-10-CM

## 2024-03-31 DIAGNOSIS — Z87891 Personal history of nicotine dependence: Secondary | ICD-10-CM | POA: Diagnosis not present

## 2024-03-31 DIAGNOSIS — Z933 Colostomy status: Secondary | ICD-10-CM

## 2024-03-31 DIAGNOSIS — F1910 Other psychoactive substance abuse, uncomplicated: Secondary | ICD-10-CM | POA: Diagnosis not present

## 2024-03-31 DIAGNOSIS — Z888 Allergy status to other drugs, medicaments and biological substances status: Secondary | ICD-10-CM | POA: Diagnosis not present

## 2024-03-31 DIAGNOSIS — Z7982 Long term (current) use of aspirin: Secondary | ICD-10-CM

## 2024-03-31 DIAGNOSIS — I10 Essential (primary) hypertension: Secondary | ICD-10-CM | POA: Diagnosis present

## 2024-03-31 DIAGNOSIS — Z841 Family history of disorders of kidney and ureter: Secondary | ICD-10-CM | POA: Diagnosis not present

## 2024-03-31 DIAGNOSIS — L89154 Pressure ulcer of sacral region, stage 4: Secondary | ICD-10-CM | POA: Diagnosis present

## 2024-03-31 DIAGNOSIS — K56609 Unspecified intestinal obstruction, unspecified as to partial versus complete obstruction: Secondary | ICD-10-CM | POA: Diagnosis present

## 2024-03-31 DIAGNOSIS — R109 Unspecified abdominal pain: Secondary | ICD-10-CM

## 2024-03-31 DIAGNOSIS — K219 Gastro-esophageal reflux disease without esophagitis: Secondary | ICD-10-CM | POA: Diagnosis present

## 2024-03-31 DIAGNOSIS — G4733 Obstructive sleep apnea (adult) (pediatric): Secondary | ICD-10-CM | POA: Diagnosis present

## 2024-03-31 DIAGNOSIS — E43 Unspecified severe protein-calorie malnutrition: Secondary | ICD-10-CM | POA: Diagnosis present

## 2024-03-31 DIAGNOSIS — E871 Hypo-osmolality and hyponatremia: Secondary | ICD-10-CM | POA: Diagnosis present

## 2024-03-31 DIAGNOSIS — Z6841 Body Mass Index (BMI) 40.0 and over, adult: Secondary | ICD-10-CM

## 2024-03-31 DIAGNOSIS — K567 Ileus, unspecified: Secondary | ICD-10-CM | POA: Diagnosis present

## 2024-03-31 DIAGNOSIS — G8220 Paraplegia, unspecified: Secondary | ICD-10-CM | POA: Diagnosis present

## 2024-03-31 DIAGNOSIS — Z79891 Long term (current) use of opiate analgesic: Secondary | ICD-10-CM | POA: Diagnosis not present

## 2024-03-31 DIAGNOSIS — R1110 Vomiting, unspecified: Principal | ICD-10-CM

## 2024-03-31 LAB — CBC WITH DIFFERENTIAL/PLATELET
Abs Immature Granulocytes: 0.03 10*3/uL (ref 0.00–0.07)
Basophils Absolute: 0.1 10*3/uL (ref 0.0–0.1)
Basophils Relative: 1 %
Eosinophils Absolute: 0.1 10*3/uL (ref 0.0–0.5)
Eosinophils Relative: 1 %
HCT: 43 % (ref 39.0–52.0)
Hemoglobin: 12.9 g/dL — ABNORMAL LOW (ref 13.0–17.0)
Immature Granulocytes: 0 %
Lymphocytes Relative: 12 %
Lymphs Abs: 1.2 10*3/uL (ref 0.7–4.0)
MCH: 26.7 pg (ref 26.0–34.0)
MCHC: 30 g/dL (ref 30.0–36.0)
MCV: 88.8 fL (ref 80.0–100.0)
Monocytes Absolute: 0.3 10*3/uL (ref 0.1–1.0)
Monocytes Relative: 3 %
Neutro Abs: 8.3 10*3/uL — ABNORMAL HIGH (ref 1.7–7.7)
Neutrophils Relative %: 83 %
Platelets: 262 10*3/uL (ref 150–400)
RBC: 4.84 MIL/uL (ref 4.22–5.81)
RDW: 17 % — ABNORMAL HIGH (ref 11.5–15.5)
WBC: 9.9 10*3/uL (ref 4.0–10.5)
nRBC: 0 % (ref 0.0–0.2)

## 2024-03-31 LAB — COMPREHENSIVE METABOLIC PANEL WITH GFR
ALT: 23 U/L (ref 0–44)
AST: 30 U/L (ref 15–41)
Albumin: 4.1 g/dL (ref 3.5–5.0)
Alkaline Phosphatase: 87 U/L (ref 38–126)
Anion gap: 12 (ref 5–15)
BUN: 16 mg/dL (ref 6–20)
CO2: 29 mmol/L (ref 22–32)
Calcium: 9.5 mg/dL (ref 8.9–10.3)
Chloride: 93 mmol/L — ABNORMAL LOW (ref 98–111)
Creatinine, Ser: 1.24 mg/dL (ref 0.61–1.24)
GFR, Estimated: >60 mL/min (ref >60)
Glucose, Bld: 158 mg/dL — ABNORMAL HIGH (ref 70–99)
Potassium: 4.7 mmol/L (ref 3.5–5.1)
Sodium: 134 mmol/L — ABNORMAL LOW (ref 135–145)
Total Bilirubin: 0.8 mg/dL (ref 0.0–1.2)
Total Protein: 9.6 g/dL — ABNORMAL HIGH (ref 6.5–8.1)

## 2024-03-31 LAB — I-STAT CHEM 8, ED
BUN: 23 mg/dL — ABNORMAL HIGH (ref 6–20)
Calcium, Ion: 1.01 mmol/L — ABNORMAL LOW (ref 1.15–1.40)
Chloride: 98 mmol/L (ref 98–111)
Creatinine, Ser: 1.2 mg/dL (ref 0.61–1.24)
Glucose, Bld: 157 mg/dL — ABNORMAL HIGH (ref 70–99)
HCT: 44 % (ref 39.0–52.0)
Hemoglobin: 15 g/dL (ref 13.0–17.0)
Potassium: 4.8 mmol/L (ref 3.5–5.1)
Sodium: 136 mmol/L (ref 135–145)
TCO2: 28 mmol/L (ref 22–32)

## 2024-03-31 LAB — I-STAT CG4 LACTIC ACID, ED: Lactic Acid, Venous: 1.8 mmol/L (ref 0.5–1.9)

## 2024-03-31 LAB — LIPASE, BLOOD: Lipase: 48 U/L (ref 11–51)

## 2024-03-31 MED ORDER — METHADONE HCL 10 MG PO TABS: 30.0000 mg | ORAL_TABLET | Freq: Once | ORAL | Status: DC

## 2024-03-31 MED ORDER — ONDANSETRON HCL 4 MG/2ML IJ SOLN
4.0000 mg | Freq: Four times a day (QID) | INTRAMUSCULAR | Status: DC | PRN
Start: 2024-03-31 — End: 2024-04-03
  Administered 2024-04-01: 4 mg via INTRAVENOUS
  Filled 2024-03-31: qty 2

## 2024-03-31 MED ORDER — HYDROMORPHONE HCL 1 MG/ML IJ SOLN
0.5000 mg | INTRAMUSCULAR | Status: DC | PRN
  Administered 2024-03-31 – 2024-04-02 (×4): 0.5 mg via INTRAVENOUS
  Filled 2024-03-31 (×4): qty 0.5

## 2024-03-31 MED ORDER — ONDANSETRON 4 MG PO TBDP
8.0000 mg | ORAL_TABLET | Freq: Once | ORAL | Status: AC
  Administered 2024-03-31: 8 mg via ORAL
  Filled 2024-03-31: qty 2

## 2024-03-31 MED ORDER — ONDANSETRON HCL 4 MG/2ML IJ SOLN: 4.0000 mg | Freq: Once | INTRAMUSCULAR | Status: DC

## 2024-03-31 MED ORDER — FENTANYL CITRATE PF 50 MCG/ML IJ SOSY: 50.0000 ug | PREFILLED_SYRINGE | Freq: Once | INTRAMUSCULAR | Status: DC

## 2024-03-31 MED ORDER — ENOXAPARIN SODIUM 40 MG/0.4ML IJ SOSY
40.0000 mg | PREFILLED_SYRINGE | INTRAMUSCULAR | Status: DC
  Administered 2024-03-31: 40 mg via SUBCUTANEOUS
  Filled 2024-03-31: qty 0.4

## 2024-03-31 MED ORDER — DEXTROSE IN LACTATED RINGERS 5 % IV SOLN: INTRAVENOUS | Status: AC

## 2024-03-31 MED ORDER — SODIUM CHLORIDE 0.9 % IV BOLUS
1000.0000 mL | Freq: Once | INTRAVENOUS | Status: AC
  Administered 2024-03-31: 1000 mL via INTRAVENOUS

## 2024-03-31 MED ORDER — ONDANSETRON HCL 4 MG PO TABS
4.0000 mg | ORAL_TABLET | Freq: Four times a day (QID) | ORAL | Status: DC | PRN
Start: 2024-03-31 — End: 2024-04-03

## 2024-03-31 MED ORDER — IOHEXOL 350 MG/ML SOLN
75.0000 mL | Freq: Once | INTRAVENOUS | Status: AC | PRN
  Administered 2024-03-31: 75 mL via INTRAVENOUS

## 2024-03-31 MED ORDER — FLEET ENEMA RE ENEM
1.0000 | ENEMA | Freq: Once | RECTAL | Status: AC
  Administered 2024-04-01: 1 via RECTAL
  Filled 2024-03-31: qty 1

## 2024-03-31 MED ORDER — ONDANSETRON HCL 4 MG/2ML IJ SOLN
4.0000 mg | Freq: Once | INTRAMUSCULAR | Status: AC
  Administered 2024-03-31: 4 mg via INTRAVENOUS
  Filled 2024-03-31: qty 2

## 2024-03-31 NOTE — ED Notes (Signed)
 Patient in xray
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
## 2024-03-31 NOTE — ED Triage Notes (Signed)
 Patient BIB [facility] from home for decreased output in colostomy bag with abdominal pain and vomiting.  BP 180/60 HR 70  CBG 133 95% RA
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
## 2024-03-31 NOTE — ED Notes (Signed)
Patient left the floor in stable condition with his belongings and staff.

## 2024-03-31 NOTE — ED Notes (Signed)
 PO meds not given due to emesis.

## 2024-03-31 NOTE — ED Provider Triage Note (Cosign Needed Addendum)
 Emergency Medicine Provider Triage Evaluation Note  [name] , a 58 y.o. male  was evaluated in triage.  Pt brought in by EMS for concerns over lack of output into his colostomy over the last 24 hours.  He has had increasing abdominal pressure and distention, feeling like there is increased stool burden in the abdomen.  Prior to EMS arrival patient began to vomit.  Notably he has a previous diagnoses of paraplegia, hypertension, colostomy, UTI, anemia of chronic disease, and is noted to be morbidly obese.  Review of Systems  Positive: As above Negative:   Physical Exam  There were no vitals taken for this visit. Gen:   Awake, no distress   Resp:  Normal effort  MSK:   Moves extremities without difficulty Other:  Bowel sounds are present and normal however there is increased tenderness diffusely across the abdomen, belly is tight to palpation, no abdominal guarding noted however abdomen is not soft.  Medical Decision Making  Medically screening exam initiated at 1:56 PM.  Appropriate orders placed.  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  Abdominal pain order set placed as noted.  Further he states that he is on normal methadone  which she has not received.  Normal dosing as per record is 30 mg daily.  As such have ordered him to get oral dosing of his methadone  along with dosing of ondansetron  for his nausea.  Suspect at this time patient has an acute bowel obstruction, with imaging and lab studies as noted.   Myriam Dorn BROCKS, PA 03/31/24 1358    Myriam Dorn BROCKS, [place] 03/31/24 1420

## 2024-04-01 DIAGNOSIS — K56609 Unspecified intestinal obstruction, unspecified as to partial versus complete obstruction: Secondary | ICD-10-CM | POA: Diagnosis not present

## 2024-04-01 LAB — COMPREHENSIVE METABOLIC PANEL WITH GFR
ALT: 20 U/L (ref 0–44)
AST: 25 U/L (ref 15–41)
Albumin: 3.5 g/dL (ref 3.5–5.0)
Alkaline Phosphatase: 72 U/L (ref 38–126)
Anion gap: 11 (ref 5–15)
BUN: 14 mg/dL (ref 6–20)
CO2: 27 mmol/L (ref 22–32)
Calcium: 8.8 mg/dL — ABNORMAL LOW (ref 8.9–10.3)
Chloride: 99 mmol/L (ref 98–111)
Creatinine, Ser: 0.97 mg/dL (ref 0.61–1.24)
GFR, Estimated: >60 mL/min (ref >60)
Glucose, Bld: 141 mg/dL — ABNORMAL HIGH (ref 70–99)
Potassium: 4.8 mmol/L (ref 3.5–5.1)
Sodium: 137 mmol/L (ref 135–145)
Total Bilirubin: 0.3 mg/dL (ref 0.0–1.2)
Total Protein: 8.1 g/dL (ref 6.5–8.1)

## 2024-04-01 LAB — CBC
HCT: 38.5 % — ABNORMAL LOW (ref 39.0–52.0)
Hemoglobin: 11.6 g/dL — ABNORMAL LOW (ref 13.0–17.0)
MCH: 26.5 pg (ref 26.0–34.0)
MCHC: 30.1 g/dL (ref 30.0–36.0)
MCV: 88.1 fL (ref 80.0–100.0)
Platelets: 231 10*3/uL (ref 150–400)
RBC: 4.37 MIL/uL (ref 4.22–5.81)
RDW: 16.8 % — ABNORMAL HIGH (ref 11.5–15.5)
WBC: 6.8 10*3/uL (ref 4.0–10.5)
nRBC: 0 % (ref 0.0–0.2)

## 2024-04-01 LAB — HIV ANTIBODY (ROUTINE TESTING W REFLEX): HIV Screen 4th Generation wRfx: NONREACTIVE

## 2024-04-01 MED ORDER — FAMOTIDINE 20 MG PO TABS
40.0000 mg | ORAL_TABLET | Freq: Every day | ORAL | Status: DC
  Administered 2024-04-01 – 2024-04-03 (×3): 40 mg via ORAL
  Filled 2024-04-01 (×3): qty 2

## 2024-04-01 MED ORDER — SODIUM CHLORIDE 0.9% FLUSH: 10.0000 mL | INTRAVENOUS | Status: DC | PRN

## 2024-04-01 MED ORDER — AMLODIPINE BESYLATE 10 MG PO TABS
10.0000 mg | ORAL_TABLET | Freq: Every day | ORAL | Status: DC
  Administered 2024-04-01 – 2024-04-03 (×3): 10 mg via ORAL
  Filled 2024-04-01 (×3): qty 1

## 2024-04-01 MED ORDER — DICLOFENAC SODIUM 1 % EX GEL
2.0000 g | Freq: Four times a day (QID) | CUTANEOUS | Status: AC
  Administered 2024-04-01 – 2024-04-02 (×2): 2 g via TOPICAL
  Filled 2024-04-01: qty 100

## 2024-04-01 MED ORDER — POLYETHYLENE GLYCOL 3350 17 G PO PACK
17.0000 g | PACK | Freq: Every day | ORAL | Status: DC
  Administered 2024-04-01 – 2024-04-02 (×2): 17 g via ORAL
  Filled 2024-04-01 (×2): qty 1

## 2024-04-01 MED ORDER — ASPIRIN 81 MG PO TBEC
81.0000 mg | DELAYED_RELEASE_TABLET | Freq: Every day | ORAL | Status: DC
  Administered 2024-04-01 – 2024-04-03 (×3): 81 mg via ORAL
  Filled 2024-04-01 (×3): qty 1

## 2024-04-01 MED ORDER — ALLOPURINOL 100 MG PO TABS
100.0000 mg | ORAL_TABLET | Freq: Every day | ORAL | Status: DC
  Administered 2024-04-01 – 2024-04-03 (×3): 100 mg via ORAL
  Filled 2024-04-01 (×3): qty 1

## 2024-04-01 MED ORDER — ENOXAPARIN SODIUM 40 MG/0.4ML IJ SOSY
40.0000 mg | PREFILLED_SYRINGE | Freq: Two times a day (BID) | INTRAMUSCULAR | Status: DC
  Administered 2024-04-01 – 2024-04-03 (×4): 40 mg via SUBCUTANEOUS
  Filled 2024-04-01 (×4): qty 0.4

## 2024-04-01 MED ORDER — ENOXAPARIN SODIUM 40 MG/0.4ML IJ SOSY
40.0000 mg | PREFILLED_SYRINGE | Freq: Once | INTRAMUSCULAR | Status: AC
  Administered 2024-04-01: 40 mg via SUBCUTANEOUS
  Filled 2024-04-01: qty 0.4

## 2024-04-01 MED ORDER — CHLORHEXIDINE GLUCONATE CLOTH 2 % EX PADS
6.0000 | MEDICATED_PAD | Freq: Every day | CUTANEOUS | Status: DC
  Administered 2024-04-01 – 2024-04-03 (×3): 6 via TOPICAL

## 2024-04-01 MED ORDER — PANTOPRAZOLE SODIUM 40 MG IV SOLR
40.0000 mg | Freq: Once | INTRAVENOUS | Status: AC
  Administered 2024-04-01: 40 mg via INTRAVENOUS
  Filled 2024-04-01: qty 10

## 2024-04-01 MED ORDER — POLYETHYLENE GLYCOL 3350 17 G PO PACK
17.0000 g | PACK | Freq: Once | ORAL | Status: AC
  Administered 2024-04-01: 17 g via ORAL
  Filled 2024-04-01: qty 1
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
## 2024-04-01 NOTE — Progress Notes (Signed)
   04/01/24 1008  SDOH Interventions  Transportation Interventions Inpatient TOC   Resources placed on AVS
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
## 2024-04-01 NOTE — TOC Initial Note (Addendum)
 Transition of Care (TOC) - Initial/Assessment Note   Spoke to patient at bedside.   Patient from home alone. Confirmed address.   Patient has personnel care services through [facility] Monday to Sunday 2 pm to 5:30 pm and Tuesdays 2 pm to 4:30 pm   Patient currently does not have HHPT/OT or RN   Patient has hospital bed, lift, power wheelchair , shower bench , CPAP and oxygen . He only uses oxygen  at night with his CPAP.   PCP is at [facility] , DR Ezzard Brochure . NCM called office and confirmed  Patient Details  Name: [name] MRN: [id number] Date of Birth: [birth year]  Transition of Care [facility]) CM/SW Contact:    Stephane Powell Jansky, RN Phone Number: 04/01/2024, 10:16 AM  Clinical Narrative:                   Expected Discharge Plan:  (Await recommendations and plan of care) Barriers to Discharge: Continued Medical Work up   Patient Goals and CMS Choice Patient states their goals for this hospitalization and ongoing recovery are:: to return to home CMS Medicare.gov Compare Post Acute Care list provided to::  (Await recommendations and plan of care) Choice offered to / list presented to :  (Await recommendations and plan of care)      Expected Discharge Plan and Services   Discharge Planning Services: CM Consult Post Acute Care Choice:  (Await recommendations and plan of care) Living arrangements for the past 2 months: Apartment                 DME Arranged:  (Await recommendations and plan of care)         HH Arranged:  (Await recommendations and plan of care)          Prior Living Arrangements/Services Living arrangements for the past 2 months: Apartment Lives with:: Self Patient language and need for interpreter reviewed:: Yes Do you feel safe going back to the place where you live?: Yes      Need for Family Participation in Patient Care: Yes (Comment) Care giver support system in place?: Yes (comment) (PCS) Current home services:  DME Criminal Activity/Legal Involvement Pertinent to Current Situation/Hospitalization: No - Comment as needed  Activities of Daily Living      Permission Sought/Granted   Permission granted to share information with : No              Emotional Assessment Appearance:: Appears stated age Attitude/Demeanor/Rapport: Engaged Affect (typically observed): Appropriate Orientation: : Oriented to Self, Oriented to Place, Oriented to  Time, Oriented to Situation Alcohol / Substance Use: Not Applicable (hx not current) Psych Involvement: No (comment)  Admission diagnosis:  Acute abdominal pain [R10.9] Hyponatremia [E87.1] Small bowel obstruction (HCC) [K56.609] SBO (small bowel obstruction) (HCC) [K56.609] Vomiting in adult [R11.10] Patient Active Problem List   Diagnosis Date Noted   SBO (small bowel obstruction) (HCC) 03/31/2024   Urethrocutaneous fistula 03/28/2023   COVID-19 01/27/2023   Colostomy complication (HCC)    Urinary tract infection 01/15/2022   Gout 12/30/2021   Morbid obesity with BMI of 40.0-44.9, adult (HCC) 12/22/2021   Intertriginous skin ulcer (HCC) 12/22/2021   Acute kidney injury (HCC) 12/20/2021   Hypospadias 12/09/2021   Small bowel obstruction (HCC) 10/13/2021   Colon cancer screening 05/20/2021   Bloating 04/11/2021   Chronic indwelling Foley catheter 04/01/2021   Orthopnea 04/01/2021   Obstructive sleep apnea 02/24/2021   Anemia of chronic disease 02/24/2021   Polysubstance  abuse (HCC) 07/09/2019   Substance induced mood disorder (HCC) 08/21/2018   GERD without esophagitis 08/14/2018   Benign essential HTN 08/14/2018   Opioid overdose (HCC) 08/14/2018   Drug-induced constipation 02/14/2017   UTI (urinary tract infection)    Colostomy in place for fecal diversion 06/01/2016   Protein-calorie malnutrition, severe (HCC) 05/31/2016   Chronic pain 05/26/2016   Hypertension 05/26/2016   Pressure injury of sacral region, stage 4 (HCC) 05/19/2015    Paraplegia (HCC)    PCP:  Carlie Rogelia LABOR PA-C Pharmacy:   [facility] - [place], [place] - [address] [place] [address] Phone: [phone] Fax: [phone]     Social Drivers of Health (SDOH) Social History: SDOH Screenings   Food Insecurity: No Food Insecurity (07/31/2023)   Received from [facility]  Housing: Low Risk  (01/31/2023)  Transportation Needs: Unmet Transportation Needs (07/31/2023)  Utilities: Low Risk  (07/31/2023)   Received from [facility]  Depression [phone]): Low Risk  (08/29/2022)  Financial Resource Strain: Low Risk  (07/31/2023)   Received from [facility]  Physical Activity: Inactive (10/09/2022)  Social Connections: Unknown (08/29/2022)  Stress: No Stress Concern Present (08/29/2022)  Tobacco Use: Medium Risk (02/20/2024)   SDOH Interventions: Transportation Interventions: Inpatient TOC   Readmission Risk Interventions     No data to display

## 2024-04-01 NOTE — Plan of Care (Signed)

## 2024-04-01 NOTE — Progress Notes (Signed)
  Progress Note   Patient: [name] FMW:[id number] DOB: 1966-08-21 DOA: 03/31/2024     1 DOS: the patient was seen and examined on 04/01/2024   Assessment and Plan: Ileus less likely SBO (likely from chronic narcotic use) - Clear liquid diet  - IV D5-LR @ 125 cc/hr  - s/p fleet enema x 1 through pt's ostomy  - Appreciate surgery following along (no surgery at this time)  Paraplegia - Chronic and complicating care   Morbid obesity  - Chronic and complicating care   Stage IV sacral decubitus ulcer - Chronic and complicating care   Chronic pain syndrome  - IV dilaudid  0.5 mg q2 hr PRN     Subjective: Pt seen and examined at the bedside. Pt reports good output since receiving the enema. Clear liquids for today. Advance diet as tolerated.   Physical Exam: Vitals:   03/31/24 2029 04/01/24 0047 04/01/24 0451 04/01/24 0900  BP: (!) 157/79 (!) 141/75 (!) 150/87 138/76  Pulse: 89 88 90 83  Resp: 18 17 17 18   Temp: 98.3 F (36.8 C) 98.2 F (36.8 C) 97.7 F (36.5 C) 98.1 F (36.7 C)  TempSrc: Oral Oral Oral Oral  SpO2: 97% 99% 100% 97%  Height:       Physical Exam HENT:     Head: Normocephalic.     Mouth/Throat:     Mouth: Mucous membranes are moist.   Cardiovascular:     Rate and Rhythm: Normal rate.  Pulmonary:     Effort: Pulmonary effort is normal.  Abdominal:     Palpations: Abdomen is soft.     Comments: Obese   Skin:    General: Skin is warm.   Neurological:     Mental Status: He is alert. Mental status is at baseline.   Psychiatric:        Mood and Affect: Mood normal.    Disposition: Status is: Inpatient Remains inpatient appropriate because: Slow diet advancement (clears today)  Planned Discharge Destination: Home    Time spent: 35 minutes  Author: Terrence Wishon , MD 04/01/2024 10:38 AM  For on call review [email or web address].

## 2024-04-01 NOTE — Progress Notes (Signed)
Patient placed on CPAP for the evening.

## 2024-04-02 ENCOUNTER — Encounter (HOSPITAL_COMMUNITY): Payer: Self-pay | Admitting: Internal Medicine

## 2024-04-02 ENCOUNTER — Other Ambulatory Visit: Payer: Self-pay

## 2024-04-02 DIAGNOSIS — I10 Essential (primary) hypertension: Secondary | ICD-10-CM | POA: Diagnosis not present

## 2024-04-02 DIAGNOSIS — F1910 Other psychoactive substance abuse, uncomplicated: Secondary | ICD-10-CM

## 2024-04-02 DIAGNOSIS — Z6841 Body Mass Index (BMI) 40.0 and over, adult: Secondary | ICD-10-CM

## 2024-04-02 DIAGNOSIS — K56609 Unspecified intestinal obstruction, unspecified as to partial versus complete obstruction: Secondary | ICD-10-CM | POA: Diagnosis not present

## 2024-04-02 DIAGNOSIS — E43 Unspecified severe protein-calorie malnutrition: Secondary | ICD-10-CM

## 2024-04-02 DIAGNOSIS — G8220 Paraplegia, unspecified: Secondary | ICD-10-CM

## 2024-04-02 DIAGNOSIS — Z933 Colostomy status: Secondary | ICD-10-CM

## 2024-04-02 LAB — COMPREHENSIVE METABOLIC PANEL WITH GFR
ALT: 16 U/L (ref 0–44)
AST: 18 U/L (ref 15–41)
Albumin: 2.8 g/dL — ABNORMAL LOW (ref 3.5–5.0)
Alkaline Phosphatase: 59 U/L (ref 38–126)
Anion gap: 6 (ref 5–15)
BUN: 10 mg/dL (ref 6–20)
CO2: 29 mmol/L (ref 22–32)
Calcium: 7.8 mg/dL — ABNORMAL LOW (ref 8.9–10.3)
Chloride: 101 mmol/L (ref 98–111)
Creatinine, Ser: 0.98 mg/dL (ref 0.61–1.24)
GFR, Estimated: >60 mL/min (ref >60)
Glucose, Bld: 97 mg/dL (ref 70–99)
Potassium: 3.7 mmol/L (ref 3.5–5.1)
Sodium: 136 mmol/L (ref 135–145)
Total Bilirubin: 0.6 mg/dL (ref 0.0–1.2)
Total Protein: 6.7 g/dL (ref 6.5–8.1)

## 2024-04-02 LAB — CBC
HCT: 34.3 % — ABNORMAL LOW (ref 39.0–52.0)
Hemoglobin: 10.1 g/dL — ABNORMAL LOW (ref 13.0–17.0)
MCH: 26.4 pg (ref 26.0–34.0)
MCHC: 29.4 g/dL — ABNORMAL LOW (ref 30.0–36.0)
MCV: 89.8 fL (ref 80.0–100.0)
Platelets: 201 10*3/uL (ref 150–400)
RBC: 3.82 MIL/uL — ABNORMAL LOW (ref 4.22–5.81)
RDW: 17.2 % — ABNORMAL HIGH (ref 11.5–15.5)
WBC: 6.5 10*3/uL (ref 4.0–10.5)
nRBC: 0 % (ref 0.0–0.2)

## 2024-04-02 LAB — MAGNESIUM: Magnesium: 1.8 mg/dL (ref 1.7–2.4)

## 2024-04-02 LAB — PHOSPHORUS: Phosphorus: 2.7 mg/dL (ref 2.5–4.6)

## 2024-04-02 MED ORDER — ADULT MULTIVITAMIN W/MINERALS CH
1.0000 | ORAL_TABLET | Freq: Every day | ORAL | Status: DC
  Administered 2024-04-02 – 2024-04-03 (×2): 1 via ORAL
  Filled 2024-04-02 (×2): qty 1

## 2024-04-02 MED ORDER — SENNOSIDES-DOCUSATE SODIUM 8.6-50 MG PO TABS
2.0000 | ORAL_TABLET | Freq: Two times a day (BID) | ORAL | Status: DC
  Administered 2024-04-02 – 2024-04-03 (×3): 2 via ORAL
  Filled 2024-04-02 (×3): qty 2

## 2024-04-02 MED ORDER — ACETAMINOPHEN 500 MG PO TABS: 1000.0000 mg | ORAL_TABLET | Freq: Three times a day (TID) | ORAL | Status: DC | PRN

## 2024-04-02 MED ORDER — HYDROMORPHONE HCL 1 MG/ML IJ SOLN: 0.5000 mg | INTRAMUSCULAR | Status: DC | PRN

## 2024-04-02 MED ORDER — ENSURE ENLIVE PO LIQD
237.0000 mL | Freq: Two times a day (BID) | ORAL | Status: DC
  Administered 2024-04-02 – 2024-04-03 (×2): 237 mL via ORAL
  Filled 2024-04-02 (×3): qty 237

## 2024-04-02 MED ORDER — METHADONE HCL 10 MG PO TABS
5.0000 mg | ORAL_TABLET | Freq: Three times a day (TID) | ORAL | Status: DC
  Filled 2024-04-02 (×2): qty 1

## 2024-04-02 MED ORDER — POLYETHYLENE GLYCOL 3350 17 G PO PACK
17.0000 g | PACK | Freq: Two times a day (BID) | ORAL | Status: DC
  Administered 2024-04-02 – 2024-04-03 (×3): 17 g via ORAL
  Filled 2024-04-02 (×3): qty 1

## 2024-04-02 MED ORDER — FLEET ENEMA RE ENEM: 1.0000 | ENEMA | Freq: Every day | RECTAL | Status: DC | PRN

## 2024-04-02 NOTE — Consult Note (Signed)
 WOC team consulted for sacral wound and colostomy. Patient has history of Stage 4 Pressure Injury sacrum/R buttock/R ischium previously followed at [facility] (last note there 09/2023) Secure chat to primary team requesting photo documentation of wound be uploaded to chart.   Patient has a longstanding colostomy, formerly followed in wound care clinic back in 2023.  Last used was a standard 2 3/4 pouching system, will place orders for such.    Per last ostomy clinic  note LLQ colostomy 1 3/4 well budded.  2 3/4 skin barrier Gerlean #2, 2 3/4 pouch Lawson [phone] and 2 barrier ring Gerlean [phone].    Awaiting response from bedside nurse at this time. WOC team will follow.    Thank you,    Powell Bar MSN, RN-BC, [employer]

## 2024-04-02 NOTE — Progress Notes (Signed)
 In to give patient medication.  He refused methadone  stated he was trying to have his bowels move and the methadone  stops him up.  Asked if he could get the dilaudid  - advised orders had changed.  Continued to refuse methadone 
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
## 2024-04-02 NOTE — Plan of Care (Signed)

## 2024-04-03 ENCOUNTER — Inpatient Hospital Stay (HOSPITAL_COMMUNITY)

## 2024-04-03 DIAGNOSIS — F1910 Other psychoactive substance abuse, uncomplicated: Secondary | ICD-10-CM | POA: Diagnosis not present

## 2024-04-03 DIAGNOSIS — K56609 Unspecified intestinal obstruction, unspecified as to partial versus complete obstruction: Secondary | ICD-10-CM | POA: Diagnosis not present

## 2024-04-03 DIAGNOSIS — I10 Essential (primary) hypertension: Secondary | ICD-10-CM | POA: Diagnosis not present

## 2024-04-03 DIAGNOSIS — E43 Unspecified severe protein-calorie malnutrition: Secondary | ICD-10-CM | POA: Diagnosis not present

## 2024-04-03 LAB — RENAL FUNCTION PANEL
Albumin: 3.2 g/dL — ABNORMAL LOW (ref 3.5–5.0)
Anion gap: 11 (ref 5–15)
BUN: 9 mg/dL (ref 6–20)
CO2: 27 mmol/L (ref 22–32)
Calcium: 8.8 mg/dL — ABNORMAL LOW (ref 8.9–10.3)
Chloride: 100 mmol/L (ref 98–111)
Creatinine, Ser: 1.01 mg/dL (ref 0.61–1.24)
GFR, Estimated: >60 mL/min (ref >60)
Glucose, Bld: 101 mg/dL — ABNORMAL HIGH (ref 70–99)
Phosphorus: 3.1 mg/dL (ref 2.5–4.6)
Potassium: 3.8 mmol/L (ref 3.5–5.1)
Sodium: 138 mmol/L (ref 135–145)

## 2024-04-03 LAB — CBC
HCT: 35 % — ABNORMAL LOW (ref 39.0–52.0)
Hemoglobin: 10.5 g/dL — ABNORMAL LOW (ref 13.0–17.0)
MCH: 26.8 pg (ref 26.0–34.0)
MCHC: 30 g/dL (ref 30.0–36.0)
MCV: 89.3 fL (ref 80.0–100.0)
Platelets: 214 10*3/uL (ref 150–400)
RBC: 3.92 MIL/uL — ABNORMAL LOW (ref 4.22–5.81)
RDW: 17 % — ABNORMAL HIGH (ref 11.5–15.5)
WBC: 7 10*3/uL (ref 4.0–10.5)
nRBC: 0 % (ref 0.0–0.2)

## 2024-04-03 LAB — MAGNESIUM: Magnesium: 1.8 mg/dL (ref 1.7–2.4)

## 2024-04-03 MED ORDER — POLYETHYLENE GLYCOL 3350 17 GM/SCOOP PO POWD: 17.0000 g | Freq: Two times a day (BID) | ORAL | 2 refills | Status: AC | PRN

## 2024-04-03 MED ORDER — SENNOSIDES-DOCUSATE SODIUM 8.6-50 MG PO TABS: 2.0000 | ORAL_TABLET | Freq: Every day | ORAL | Status: AC

## 2024-04-03 MED ORDER — GERHARDT'S BUTT CREAM
TOPICAL_CREAM | Freq: Two times a day (BID) | CUTANEOUS | Status: DC | PRN
  Filled 2024-04-03: qty 60

## 2024-04-03 MED ORDER — MAGNESIUM CITRATE PO SOLN: 1.0000 | Freq: Every day | ORAL | Status: AC | PRN

## 2024-04-03 MED ORDER — METHADONE HCL 10 MG PO TABS
5.0000 mg | ORAL_TABLET | Freq: Three times a day (TID) | ORAL | Status: DC
  Administered 2024-04-03: 5 mg via ORAL
  Filled 2024-04-03: qty 1
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
## 2024-04-03 NOTE — Consult Note (Signed)
 Wound care photo uploaded by R. Prajapati, RN.  Appreciate her assistance with this consult.   Patient is known to [facility] team from previous admissions with a chronic Stage 4 Pressure Injury sacrum/R buttock previously followed at [facility]. Last seen there 09/2023 when sacrum was deemed healed.    On photo documentation extensive scar tissue noted, pink dry. No open wounds noted.  However, will place orders for any open area noted by bedside RN to be cleansed with NS and silver  hydrofiber (Aquacel AG Gerlean #[id number]) be placed.  Aquacel can be secured with silicone foam or ABD pads whichever preferred as long as silicone foam does not hold moisture on wound.  Will write for Gerhardt's Butt Cream 2 times daily.    Patient is already on a low air loss mattress per bedside nurse for pressure redistribution as this healed wound is considered high risk for reopening.    WOC team will not follow.  Re-consult if further needs arise.   Thank you,    Powell Bar MSN, RN-BC, [employer]

## 2024-04-03 NOTE — Plan of Care (Signed)
  Problem: Education: Goal: Knowledge of General Education information will improve Description: Including pain rating scale, medication(s)/side effects and non-pharmacologic comfort measures Outcome: Progressing   Problem: Health Behavior/Discharge Planning: Goal: Ability to manage health-related needs will improve Outcome: Progressing   Problem: Clinical Measurements: Goal: Ability to maintain clinical measurements within normal limits will improve Outcome: Progressing   Problem: Nutrition: Goal: Adequate nutrition will be maintained Outcome: Progressing   Problem: Elimination: Goal: Will not experience complications related to bowel motility Outcome: Progressing   

## 2024-04-03 NOTE — Progress Notes (Incomplete)
 Reviewed home medication with patient  Unable to print AVS Floor to finish discharge when PTAR arrives

## 2024-04-03 NOTE — Plan of Care (Signed)
  Problem: Education: Goal: Knowledge of General Education information will improve Description: Including pain rating scale, medication(s)/side effects and non-pharmacologic comfort measures 04/03/2024 1449 by Robynn Avelina LABOR, RN Outcome: Adequate for Discharge 04/03/2024 1449 by Robynn Avelina LABOR, RN Outcome: Adequate for Discharge   Problem: Health Behavior/Discharge Planning: Goal: Ability to manage health-related needs will improve 04/03/2024 1449 by Robynn Avelina LABOR, RN Outcome: Adequate for Discharge 04/03/2024 1449 by Robynn Avelina LABOR, RN Outcome: Adequate for Discharge   Problem: Clinical Measurements: Goal: Ability to maintain clinical measurements within normal limits will improve 04/03/2024 1449 by Robynn Avelina LABOR, RN Outcome: Adequate for Discharge 04/03/2024 1449 by Robynn Avelina LABOR, RN Outcome: Adequate for Discharge Goal: Will remain free from infection 04/03/2024 1449 by Robynn Avelina LABOR, RN Outcome: Adequate for Discharge 04/03/2024 1449 by Robynn Avelina LABOR, RN Outcome: Adequate for Discharge Goal: Diagnostic test results will improve 04/03/2024 1449 by Robynn Avelina LABOR, RN Outcome: Adequate for Discharge 04/03/2024 1449 by Robynn Avelina LABOR, RN Outcome: Adequate for Discharge Goal: Respiratory complications will improve 04/03/2024 1449 by Robynn Avelina LABOR, RN Outcome: Adequate for Discharge 04/03/2024 1449 by Robynn Avelina LABOR, RN Outcome: Adequate for Discharge Goal: Cardiovascular complication will be avoided 04/03/2024 1449 by Robynn Avelina LABOR, RN Outcome: Adequate for Discharge 04/03/2024 1449 by Robynn Avelina LABOR, RN Outcome: Adequate for Discharge   Problem: Activity: Goal: Risk for activity intolerance will decrease 04/03/2024 1449 by Robynn Avelina LABOR, RN Outcome: Adequate for Discharge 04/03/2024 1449 by Robynn Avelina LABOR, RN Outcome: Adequate for Discharge   Problem: Nutrition: Goal: Adequate nutrition will be  maintained 04/03/2024 1449 by Robynn Avelina LABOR, RN Outcome: Adequate for Discharge 04/03/2024 1449 by Robynn Avelina LABOR, RN Outcome: Adequate for Discharge   Problem: Coping: Goal: Level of anxiety will decrease 04/03/2024 1449 by Robynn Avelina LABOR, RN Outcome: Adequate for Discharge 04/03/2024 1449 by Robynn Avelina LABOR, RN Outcome: Adequate for Discharge   Problem: Elimination: Goal: Will not experience complications related to bowel motility 04/03/2024 1449 by Robynn Avelina LABOR, RN Outcome: Adequate for Discharge 04/03/2024 1449 by Robynn Avelina LABOR, RN Outcome: Adequate for Discharge Goal: Will not experience complications related to urinary retention 04/03/2024 1449 by Robynn Avelina LABOR, RN Outcome: Adequate for Discharge 04/03/2024 1449 by Robynn Avelina LABOR, RN Outcome: Adequate for Discharge   Problem: Pain Managment: Goal: General experience of comfort will improve and/or be controlled 04/03/2024 1449 by Robynn Avelina LABOR, RN Outcome: Adequate for Discharge 04/03/2024 1449 by Robynn Avelina LABOR, RN Outcome: Adequate for Discharge   Problem: Safety: Goal: Ability to remain free from injury will improve 04/03/2024 1449 by Robynn Avelina LABOR, RN Outcome: Adequate for Discharge 04/03/2024 1449 by Robynn Avelina LABOR, RN Outcome: Adequate for Discharge   Problem: Skin Integrity: Goal: Risk for impaired skin integrity will decrease 04/03/2024 1449 by Robynn Avelina LABOR, RN Outcome: Adequate for Discharge 04/03/2024 1449 by Robynn Avelina LABOR, RN Outcome: Adequate for Discharge

## 2024-04-03 NOTE — Care Management Important Message (Signed)
 Important Message  Patient Details  Name: [name] MRN: [id number] Date of Birth: 05-01-1966   Important Message Given:  Yes - Medicare IM     Jon Cruel 04/03/2024, 12:15 PM

## 2024-05-30 ENCOUNTER — Other Ambulatory Visit: Payer: Self-pay

## 2024-05-30 ENCOUNTER — Emergency Department (HOSPITAL_COMMUNITY)
Admission: EM | Admit: 2024-05-30 | Discharge: 2024-05-30 | Disposition: A | Discharge status: Home / Self Care | Attending: Emergency Medicine | Admitting: Emergency Medicine

## 2024-05-30 ENCOUNTER — Emergency Department (HOSPITAL_COMMUNITY)

## 2024-05-30 ENCOUNTER — Encounter (HOSPITAL_COMMUNITY): Payer: Self-pay

## 2024-05-30 DIAGNOSIS — Z7982 Long term (current) use of aspirin: Secondary | ICD-10-CM | POA: Diagnosis not present

## 2024-05-30 DIAGNOSIS — M545 Low back pain, unspecified: Secondary | ICD-10-CM | POA: Insufficient documentation

## 2024-05-30 DIAGNOSIS — Y92481 Parking lot as the place of occurrence of the external cause: Secondary | ICD-10-CM | POA: Diagnosis not present

## 2024-05-30 MED ORDER — LIDOCAINE 5 % EX PTCH
1.0000 | MEDICATED_PATCH | CUTANEOUS | Status: DC
Start: 2024-05-30 — End: 2024-05-30
  Administered 2024-05-30: 1 via TRANSDERMAL
  Filled 2024-05-30: qty 1
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
## 2024-07-25 ENCOUNTER — Ambulatory Visit

## 2024-07-25 DIAGNOSIS — M6281 Muscle weakness (generalized): Secondary | ICD-10-CM | POA: Insufficient documentation
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
